# Patient Record
Sex: Male | Born: 1965 | Race: White | Hispanic: No | Marital: Married | Smoking: Current every day smoker
Health system: Southern US, Community
[De-identification: ages and names within clinical notes are randomized; demographics above are authoritative.]

## PROBLEM LIST (undated history)

## (undated) DIAGNOSIS — K219 Gastro-esophageal reflux disease without esophagitis: Secondary | ICD-10-CM

## (undated) DIAGNOSIS — F419 Anxiety disorder, unspecified: Secondary | ICD-10-CM

## (undated) DIAGNOSIS — J189 Pneumonia, unspecified organism: Secondary | ICD-10-CM

## (undated) DIAGNOSIS — Z87898 Personal history of other specified conditions: Secondary | ICD-10-CM

## (undated) DIAGNOSIS — I499 Cardiac arrhythmia, unspecified: Secondary | ICD-10-CM

## (undated) DIAGNOSIS — R0602 Shortness of breath: Secondary | ICD-10-CM

## (undated) DIAGNOSIS — E119 Type 2 diabetes mellitus without complications: Secondary | ICD-10-CM

## (undated) DIAGNOSIS — D751 Secondary polycythemia: Secondary | ICD-10-CM

## (undated) DIAGNOSIS — E785 Hyperlipidemia, unspecified: Secondary | ICD-10-CM

## (undated) DIAGNOSIS — I471 Supraventricular tachycardia, unspecified: Secondary | ICD-10-CM

## (undated) DIAGNOSIS — H53459 Other localized visual field defect, unspecified eye: Secondary | ICD-10-CM

## (undated) DIAGNOSIS — R569 Unspecified convulsions: Secondary | ICD-10-CM

## (undated) DIAGNOSIS — M1990 Unspecified osteoarthritis, unspecified site: Secondary | ICD-10-CM

## (undated) DIAGNOSIS — R7303 Prediabetes: Secondary | ICD-10-CM

## (undated) DIAGNOSIS — F1910 Other psychoactive substance abuse, uncomplicated: Secondary | ICD-10-CM

## (undated) DIAGNOSIS — R51 Headache: Secondary | ICD-10-CM

## (undated) DIAGNOSIS — R269 Unspecified abnormalities of gait and mobility: Secondary | ICD-10-CM

## (undated) DIAGNOSIS — I10 Essential (primary) hypertension: Secondary | ICD-10-CM

## (undated) DIAGNOSIS — Z972 Presence of dental prosthetic device (complete) (partial): Secondary | ICD-10-CM

## (undated) DIAGNOSIS — Z9981 Dependence on supplemental oxygen: Secondary | ICD-10-CM

## (undated) DIAGNOSIS — J449 Chronic obstructive pulmonary disease, unspecified: Secondary | ICD-10-CM

## (undated) DIAGNOSIS — Z9889 Other specified postprocedural states: Secondary | ICD-10-CM

## (undated) DIAGNOSIS — Z973 Presence of spectacles and contact lenses: Secondary | ICD-10-CM

## (undated) DIAGNOSIS — R413 Other amnesia: Secondary | ICD-10-CM

## (undated) DIAGNOSIS — I82409 Acute embolism and thrombosis of unspecified deep veins of unspecified lower extremity: Secondary | ICD-10-CM

## (undated) HISTORY — DX: Secondary polycythemia: D75.1

## (undated) HISTORY — DX: Other specified postprocedural states: Z98.890

## (undated) HISTORY — DX: Prediabetes: R73.03

## (undated) HISTORY — DX: Dependence on supplemental oxygen: Z99.81

## (undated) HISTORY — DX: Unspecified convulsions: R56.9

## (undated) HISTORY — DX: Other psychoactive substance abuse, uncomplicated: F19.10

## (undated) HISTORY — PX: INNER EAR SURGERY: SHX679

## (undated) HISTORY — DX: Unspecified abnormalities of gait and mobility: R26.9

## (undated) HISTORY — DX: Other amnesia: R41.3

## (undated) HISTORY — DX: Hyperlipidemia, unspecified: E78.5

---
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
## 1991-09-24 HISTORY — PX: TYMPANOSTOMY TUBE PLACEMENT: SHX32

## 2001-09-23 HISTORY — PX: FRACTURE SURGERY: SHX138

## 2001-09-23 HISTORY — PX: ANKLE SURGERY: SHX546

## 2002-04-25 ENCOUNTER — Emergency Department (HOSPITAL_COMMUNITY): Admission: EM | Admit: 2002-04-25 | Discharge: 2002-04-25 | Payer: Self-pay | Admitting: *Deleted

## 2003-03-16 ENCOUNTER — Inpatient Hospital Stay (HOSPITAL_COMMUNITY): Admission: EM | Admit: 2003-03-16 | Discharge: 2003-03-21 | Payer: Self-pay | Admitting: Psychiatry

## 2003-03-22 ENCOUNTER — Other Ambulatory Visit (HOSPITAL_COMMUNITY): Admission: RE | Admit: 2003-03-22 | Discharge: 2003-04-14 | Payer: Self-pay | Admitting: Psychiatry

## 2004-08-27 ENCOUNTER — Ambulatory Visit: Payer: Self-pay | Admitting: Family Medicine

## 2006-09-09 ENCOUNTER — Ambulatory Visit (HOSPITAL_COMMUNITY): Admission: RE | Admit: 2006-09-09 | Discharge: 2006-09-09 | Payer: Self-pay | Admitting: Orthopaedic Surgery

## 2006-09-23 HISTORY — PX: SHOULDER ARTHROSCOPY: SHX128

## 2007-03-10 ENCOUNTER — Ambulatory Visit (HOSPITAL_COMMUNITY): Admission: RE | Admit: 2007-03-10 | Discharge: 2007-03-10 | Payer: Self-pay | Admitting: Internal Medicine

## 2007-09-24 DIAGNOSIS — R51 Headache: Secondary | ICD-10-CM

## 2007-09-24 HISTORY — DX: Headache: R51

## 2007-09-24 HISTORY — PX: BRAIN SURGERY: SHX531

## 2008-02-02 ENCOUNTER — Emergency Department (HOSPITAL_COMMUNITY): Admission: EM | Admit: 2008-02-02 | Discharge: 2008-02-02 | Payer: Self-pay | Admitting: Emergency Medicine

## 2010-10-14 ENCOUNTER — Encounter: Payer: Self-pay | Admitting: Internal Medicine

## 2011-02-08 NOTE — Op Note (Signed)
Harold Bailey, Harold Bailey NO.:  [id number]   MEDICAL RECORD NO.:  [id number]          PATIENT TYPE:  OIB   LOCATION:  2550                         FACILITY:  [facility]   PHYSICIAN:  Vanita Panda. Magnus Ivan, M.D.DATE OF BIRTH:  01-07-1966   DATE OF PROCEDURE:  09/09/2006  DATE OF DISCHARGE:                               OPERATIVE REPORT   PREOPERATIVE DIAGNOSIS:  Left shoulder anterior labral tear with Bankart  lesion.   POSTOPERATIVE DIAGNOSIS:  Left shoulder anterior labral tear with  Bankart lesion.   PROCEDURE:  1. Left shoulder arthroscopy with extensive debridement.  2. Left shoulder arthroscopic anterior labral repair.   SURGEON:  Doneen Poisson, M.D.   ANESTHESIA:  1. Left regional shoulder block (interscalene block).  2. General anesthesia.   ANTIBIOTICS:  1 gram IV Ancef.   BLOOD LOSS:  Minimal.   COMPLICATIONS:  None.   INDICATIONS:  Briefly, Harold Bailey is a 45 year old with a history of  previous shoulder dislocations on several occasions.  He had a recent  fall and had continued anterior shoulder pain with symptoms of the  instability.  An MRI was obtained that showed an anterior labral tear  with a Bankart lesion and loose cartilage.  The rotator cuff was intact  per the MRI.  It was recommended that he undergo left shoulder  arthroscopy with debridement and labral repair.  The risks and benefits  of this were explained to him and well understood.  He agreed to proceed  with surgery.   PROCEDURE DESCRIPTION:  After interscalene block was obtained on the  right shoulder and it was marked, he was brought to the operating room  and placed supine on the operating table.  General anesthesia was then  obtained.  He was then turned into a lateral decubitus position with the  left side up.  He was on a beanbag as well.  Appropriate padding was  placed all over his body including his down leg and his genitals and  down arm, and axillary roll  was placed as well.  The beanbag was  inflated to allow for positioning.  I then had his arm prepped and  draped with DuraPrep and sterile drapes.  A shoulder traction device was  used which was an Arthrex shoulder traction device with 10 pounds of  traction longitudinally and upward traction as well to help pull the  humeral head away from the glenoid.  A time-out was then called and  identified as the correct patient and correct extremity.  I made a  posterior portal off the posterior lateral edge of the acromion and  entered the glenohumeral joint with the arthroscope.  Immediately, you  could telangiectasias that there was an anterior labral tear that  appeared chronic in nature.  The biceps tendon was intact.  The superior  labrum and posterior labrum appeared to be intact as well.  There was a  large Bankart lesion that had smooth margins, and it was unstable.  I  then made an anterior portal in the interval between this subscapularis  tendon and the biceps tendon and through this portal carried out  an  extensive debridement in the glenohumeral joint including shaving the  anterior margin of the glenoid to generate bleeding.  I used biters to  remove the Bankart piece that was caught up in the labrum, but I felt  this piece could not remain in the shoulder.  Noted, there were numerous  loose bodies present as well, and these were all removed.  I then made a  separate anterior portal as well for beginning the labral repair  process.  Again, I roughened up the surface along the anterior ledge of  the glenoid and got good bleeding tissue.  I next placed a suture passer  through labral tissue and then using a drill placed a drill hole into  the glenoid and then was able to place a push lock in approximately the  3 o'clock position of the shoulder.  I next used a suture passer and  placed a separate suture anchor slightly inferior to this.  I then  carried out a further debridement in the  shoulder and entered the  subacromial space, but it was difficult to visualize the subacromial  space in this position. I just made a separate lateral incision and  using a soft tissue ablation wand performed a bursectomy in this area,  but this was again greatly limited.  I did feel the rotator cuff was  intact.  Of note, in the glenohumeral joint, I did see a large Hill-  Sachs lesion as well that appeared old.  All instrumentation was then  removed, and I closed all portal sites with interrupted 4-0 nylon  suture.  Xeroform was placed in these wounds followed by a well-padded  sterile dressing and ABD and surgical tape.  The arm was taken out of  the traction device.  Ice was placed as well as a sling.  He was  positioned in supine position and was awakened, extubated and taken to  recovery room in stable condition.  There were no complications, and all  final counts were correct.  Postoperatively, he will remain in a sling  and will be kept from much activities of the shoulder until I feel that  he is making progress with healing.           ______________________________  Vanita Panda. Magnus Ivan, M.D.     CYB/MEDQ  D:  09/09/2006  T:  09/10/2006  Job:  [id number]

## 2011-02-08 NOTE — H&P (Signed)
NAME:  Harold Bailey, Harold Bailey                      ACCOUNT NO.:  [id number]   MEDICAL RECORD NO.:  [id number]                   PATIENT TYPE:  IPS   LOCATION:  0502                                 [facility]ILITY[facility]   PHYSICIAN:  Geoffery Lyons, M.D.                   DATE OF BIRTH:  05-Aug-1966   DATE OF ADMISSION:  03/16/2003  DATE OF DISCHARGE:  03/21/2003                         PSYCHIATRIC ADMISSION ASSESSMENT   DATE OF ASSESSMENT:  March 17, 2003   CHIEF COMPLAINT:  I might as well be dead if I'm going to use this stuff.  He is referring to his drug use.   PATIENT IDENTIFICATION:  This is a 45 year old white male who is divorced.  This is a voluntary admission.   HISTORY OF PRESENT ILLNESS:  This patient relapsed on alcohol and cocaine,  has been drinking six to 12 beers daily and has been drinking since age 30.  He also reports that he has been using crack since 1994, smoking about $100  per day whenever he can get this, which has been on an on and off basis for  the past three years.  His longest period clean and sober has been about six  years running and this has been several years ago.  Most recently, he used  about $100 of cocaine on the day prior to admission and drank a 12 pack of  beer.  He reports that he has had suicidal thoughts for the past two days  without any plan, feels worthless, hopeless, and helpless to get over his  substance abuse.  He reports his relapse triggers that kept him from staying  sober was that he just simply had too much time on his hands.   PAST PSYCHIATRIC HISTORY:  The patient has had no prior treatment.   SUBSTANCE ABUSE HISTORY:  As noted above.   PAST MEDICAL HISTORY:  The patient is followed by Tera Mater. Clent Ridges, M.D. [facility],  at [facility].  This is his primary care physician.  Medical  problems are none at this time.  He reports no acute medical problems.  Past  medical history is remarkable for some prior surgery on his left ankle.  He  does have a history of prior treatment on his ears with reporting that the  canals itch and he has some bleeding from his ears at times.  He smokes two  packs per day of cigarettes.  He denies any history of seizures, blackouts,  or memory loss.   MEDICATIONS:  None.   DRUG ALLERGIES:  None.   REVIEW OF SYSTEMS:  Review of systems is remarkable for him having some left  ear pain this morning and some decreased hearing.  This has been bothering  him in the past and a little bit for the past 48 hours but seemed quite a  bit worse this morning.   PHYSICAL EXAMINATION:  GENERAL:  The patient's physical examination  was done  in the emergency room.  To update his physical today, we note that he is a  well nourished, well developed, generally healthy appearing, muscular male  who is in no acute distress.  He is complaining of some left ear pain but is  generally fully alert, pleasant, cooperative.  VITAL SIGNS:  He is afebrile, pulse 102, respirations 22, blood pressure  126/80.  He is 6 feet tall and weighs 202 pounds at admission.  HEENT:  Head is normocephalic, atraumatic.  EENT: PERRLA.  Sclerae are  nonicteric.  No rhinorrhea.  TM reveal somewhat reddened and slightly scaly  EAC on the right but landmarks are clearly visible.  His TM is intact.  His  left tympanic membranes is reddened; it is difficult to visualize due to  copious amounts of purulent matter in the left external ear canal, not able  to see that the TM is ruptured in any way.  On the left ear, he has a  positive pinna tug.  Oropharynx is noninjected.  NECK:  Supple, no thyromegaly.  CARDIOVASCULAR:  Apical pulse is regular and synchronous with radial pulse.  LUNGS:  Clear to auscultation.  ABDOMEN:  Muscular, flat, soft, nontender.  GENITALIA:  Deferred.  MUSCULOSKELETAL:  Strength is 5/5.  No joint deformities noted.  NEUROLOGIC:  Cranial nerves II-XII are intact.  EOM are intact, no  nystagmus.  Motor movements are  smooth.  Grip strength: Equal bilaterally.  Facial symmetry is present.  Romberg: Without findings.  Deep tendon  reflexes 3+/5.  Toes are downgoing.  No focal findings.   LABORATORY DATA:  Diagnostic studies revealed his CBC was within normal  limits as was his routine chemistry and liver enzymes were normal.  Thyroid  was currently pending.  Urinalysis and urine drug screen were currently  pending.   SOCIAL HISTORY:  The patient is divorced for the past 10 years, works full-  time for [employer] in the [employer].  He lives alone, no  children.  He does have legal charges for passing bad checks.   FAMILY HISTORY:  Family history is remarkable a father with a history of  alcohol abuse.   MENTAL STATUS EXAM:  This is a fully alert male who is talkative with an  appropriate affect, cooperative, pleasant, polite.  Speech: Normal.  Mood is  euthymic.  Thought process is logical and coherent.  He is positive for some  suicidal thought, no clear plan but feels hopeless, worthless, unable to see  his way clear to stay sober and clean but he is aware of his triggers.  No  homicidal ideation, no auditory and visual hallucinations.  Cognitive:  Intact and oriented x 3.   ADMISSION DIAGNOSES:   AXIS I:  1. Alcohol abuse and dependence.  2. Cocaine abuse, rule out dependence.   AXIS II:  No diagnosis.   AXIS III:  Otitis media, rule out ruptured tympanic membrane.   AXIS IV:  Problems with increased idle time and loneliness.   AXIS V:  Current 39, past year 52.   INITIAL PLAN OF CARE:  Plan is to voluntarily admit the patient to stabilize  and alleviate his suicidal thought.  We are going to place him on trazodone  50 mg h.s. p.r.n. and this may be repeated once.  We have placed him on  Lexapro 10 mg p.o. q.a.m. to address his depression symptoms.  The Librium  protocol will be used to detoxify him from alcohol, which so  far he is tolerating well.  Meanwhile, we will  also place him on Vistaril 25 mg p.o.  h.s. p.r.n. for anxiety and that may be repeated x 1 and Symmetrel 100 mg  b.i.d. for cravings.  We are going to refer him to  [facility] after discharge is what our initial thinking is and we are going to  give him some Percocet for his ear pain and ask internal medicine to repeat  our ear exam and treat this as they see fit.   ESTIMATED LENGTH OF STAY:  Five days.     Margaret A. Scott, N.P.                   Geoffery Lyons, M.D.    MAS/MEDQ  D:  06/14/2003  T:  06/15/2003  Job:  [id number]

## 2011-02-08 NOTE — Discharge Summary (Signed)
NAME:  Harold Bailey, Harold Bailey                      ACCOUNT NO.:  [id number]   MEDICAL RECORD NO.:  [id number]                   PATIENT TYPE:  IPS   LOCATION:  0502                                 [facility]ACILITY[facility]   PHYSICIAN:  Geoffery Lyons, M.D.                   DATE OF BIRTH:  04/13/1966   DATE OF ADMISSION:  03/16/2003  DATE OF DISCHARGE:  03/21/2003                                 DISCHARGE SUMMARY   CHIEF COMPLAINT AND PRESENT ILLNESS:  This was the first admission to [facility] for this 45 year old white male, divorced,  voluntarily admitted.  Relapsed on alcohol and cocaine, drinking 6-12 beers  daily.  Has been drinking since age 37, using crack since 1994, smoking $100  per day when he could spend the money in the last three years, on and off.  Suicidal thoughts for two days without a plan.  Feeling worthless.  Feeling  that he would rather be dead than continuing using that stuff.   PAST PSYCHIATRIC HISTORY:  No previous treatment.   ALCOHOL/DRUG HISTORY:  As already stated, persistent use of alcohol and  cocaine.   PAST MEDICAL HISTORY:  No history of any major medical conditions.   MEDICATIONS:  None.   PHYSICAL EXAMINATION:  Performed and failed to show any acute findings.   MENTAL STATUS EXAM:  Fully alert, talkative male with appropriate affect.  Speech within normal limits.  Mood euthymic.  Mood anxious.  Affect broad.  Thought processes logical and coherent.  Endorsed underlying suicidal  ideation.  No plan.  No homicidal ideation.  No delusions.  No  hallucinations.  Cognition well-preserved.   ADMISSION DIAGNOSES:   AXIS I:  1. Alcohol dependence.  2. Cocaine dependence.  3. Rule out depressive disorder not otherwise specified.   AXIS II:  No diagnosis.   AXIS III:  Otitis media.   AXIS IV:  Moderate.   AXIS V:  Global Assessment of Functioning upon admission 29; highest Global  Assessment of Functioning in the last year 62.   LABORATORY DATA:  CBC with hemoglobin 17.8.  Blood chemistry within normal  limits.  Thyroid profile within normal limits.   HOSPITAL COURSE:  He was admitted and started intensive individual and group  psychotherapy.  He was detoxified using Librium.  He was initially given  some Lexapro and some trazodone but he preferred not to take any  medications.  By the second day, he was doing much better with hardly any  Librium.  He was treated for otitis media.  He claimed he started drinking,  got intoxicated and then started using cocaine.  Mood continued to be of  anxiety.  Affect of anxiety.  He was dealing with the events that led to the  hospitalization.  Working on himself and coping skills, relapse prevention.  Wanting to pursue 12-step.  Speech basically pressured, loud.  Possibility  of ADHD  was raised.  Continued to work on himself, on a relapse prevention  plan.  On April 20, 2003, he was in full contact with reality.  Mood  euthymic.  Affect broad, bright.  No delusions, hallucinations.  Increased  insight.  Committed to abstinence.  We went ahead and discharged to  outpatient follow-up.   DISCHARGE DIAGNOSES:   AXIS I:  1. Alcohol and cocaine dependence.  2. Rule out attention-deficit hyperactivity disorder.   AXIS II:  No diagnosis.   AXIS III:  Otitis media.   AXIS IV:  Moderate.   AXIS V:  Global Assessment of Functioning upon discharge 50.   DISCHARGE MEDICATIONS:  __________ %, otic drops, 10 days.   [facility]                                               Geoffery Lyons, M.D.    IL/MEDQ  D:  05/25/2003  T:  05/26/2003  Job:  [id number]

## 2011-10-02 ENCOUNTER — Other Ambulatory Visit: Payer: Self-pay | Admitting: Orthopaedic Surgery

## 2011-10-02 DIAGNOSIS — M25512 Pain in left shoulder: Secondary | ICD-10-CM

## 2011-10-14 ENCOUNTER — Inpatient Hospital Stay: Admission: RE | Admit: 2011-10-14 | Payer: Self-pay | Source: Ambulatory Visit

## 2011-10-18 ENCOUNTER — Inpatient Hospital Stay: Admission: RE | Admit: 2011-10-18 | Payer: Self-pay | Source: Ambulatory Visit

## 2011-10-21 ENCOUNTER — Other Ambulatory Visit: Payer: Self-pay

## 2011-10-22 ENCOUNTER — Ambulatory Visit (HOSPITAL_COMMUNITY)
Admission: RE | Admit: 2011-10-22 | Discharge: 2011-10-22 | Disposition: A | Discharge status: Home / Self Care | Payer: Self-pay | Attending: Psychiatry | Admitting: Psychiatry

## 2011-10-22 DIAGNOSIS — F1994 Other psychoactive substance use, unspecified with psychoactive substance-induced mood disorder: Secondary | ICD-10-CM | POA: Insufficient documentation

## 2011-10-22 DIAGNOSIS — F1020 Alcohol dependence, uncomplicated: Secondary | ICD-10-CM | POA: Insufficient documentation

## 2011-10-22 NOTE — BH Assessment (Signed)
Assessment Note   Harold Bailey is an 46 y.o. male. PT WAS REFERRED BY PAIN MANAGEMENT CLINIC DUE TO PT ABUSING ETOH WITH PAIN MEDS. CLINICIAN SPOKE WITH AMY STARNS WHO EXPRESSED THAT PT NEEDED HELP & HAD SPOKEN WITH ANN EVAN WHO AGREED TO TAKE THE PT. PT DENIES SI, HI  OR AV. PER ANY STARNS, PT DRINKS 12PK DAILY WHICH PT DENIES AMT USE & ANY CURRENT COCAINE USE. PT HAS EXPRESSED THAT HE WAS NOT READY TO STARTED [facility] BUT WILL TRY TO START NEXT. PT DENIES ANY STRESSORS & LIVES WITH SPOUSE. PT IS ABLE TO TO CONTRACT FOR SAFETY.  Axis I: Substance Induced Mood Disorder and ALCOHOL DEPENDENCE Axis II: Deferred Axis III: No past medical history on file. Axis IV: other psychosocial or environmental problems and problems with primary support group Axis V: 51-60 moderate symptoms  Past Medical History: No past medical history on file.  No past surgical history on file.  Family History: No family history on file.  Social History:  does not have a smoking history on file. He does not have any smokeless tobacco history on file. His alcohol and drug histories not on file.  Additional Social History:    Allergies: Allergies not on file  Home Medications:  No current outpatient prescriptions on file as of 10/22/2011.   No current facility-administered medications on file as of 10/22/2011.    OB/GYN Status:  No LMP for male patient.  General Assessment Data Location of Assessment: [facility] Services Living Arrangements: Spouse/significant other Can pt return to current living arrangement?: Yes Admission Status: Voluntary Is patient capable of signing voluntary admission?: Yes Transfer from: Home Referral Source: Other     Risk to self Suicidal Ideation: No Suicidal Intent: No Is patient at risk for suicide?: No Suicidal Plan?: No Access to Means: No What has been your use of drugs/alcohol within the last 12 months?: PT HAS BEEN DRINK SINCE AGE 79 & WAS DRINKING 12 PK DAILY &  LAST DRINK WAS 2 WEEKS AGO;; PT ADMIT TO CRACK COCAINE AT AGE 79 & HAS BEEN SOBER FROM IX 11YRS Previous Attempts/Gestures: No How many times?: 0  Other Self Harm Risks: NA Triggers for Past Attempts: Unpredictable Intentional Self Injurious Behavior: None Family Suicide History: Unknown Recent stressful life event(s): Other (Comment) Persecutory voices/beliefs?: No Depression: No Depression Symptoms: Loss of interest in usual pleasures Substance abuse history and/or treatment for substance abuse?: Yes Suicide prevention information given to non-admitted patients: Not applicable  Risk to Others Homicidal Ideation: No Thoughts of Harm to Others: No Current Homicidal Intent: No Current Homicidal Plan: No Access to Homicidal Means: No Identified Victim: NA History of harm to others?: No Assessment of Violence: None Noted Violent Behavior Description: CALM, COOPERATIVE Does patient have access to weapons?: No Criminal Charges Pending?: No Does patient have a court date: No  Psychosis Hallucinations: None noted Delusions: None noted  Mental Status Report Appear/Hygiene: Improved Eye Contact: Poor Motor Activity: Freedom of movement Speech: Logical/coherent Level of Consciousness: Alert Mood: Depressed Affect: Appropriate to circumstance;Anxious;Apprehensive Anxiety Level: Moderate Judgement: Impaired Orientation: Person;Place;Time;Situation Obsessive Compulsive Thoughts/Behaviors: None  Cognitive Functioning Concentration: Decreased Memory: Recent Intact;Remote Intact IQ: Average Insight: Poor Impulse Control: Poor Appetite: Poor Weight Loss: 0  Weight Gain: 0  Sleep: Decreased Total Hours of Sleep: 4  Vegetative Symptoms: None  Prior Inpatient Therapy Prior Inpatient Therapy: No Prior Therapy Dates: NA Prior Therapy Facilty/Provider(s): NA Reason for Treatment: NA  Prior Outpatient Therapy Prior Outpatient Therapy: No  Prior Therapy Dates: NA Prior Therapy  Facilty/Provider(s): NA Reason for Treatment: NA                     Additional Information 1:1 In Past 12 Months?: No CIRT Risk: No Elopement Risk: No Does patient have medical clearance?: No     Disposition:  Disposition Disposition of Patient: Outpatient treatment Type of outpatient treatment: Chemical Dependence - Intensive Outpatient  On Site Evaluation by:   Reviewed with Physician:     Waldron Session 10/22/2011 1:00 PM

## 2011-11-07 ENCOUNTER — Encounter (HOSPITAL_COMMUNITY): Payer: Self-pay | Admitting: Pharmacy Technician

## 2011-11-11 ENCOUNTER — Other Ambulatory Visit (HOSPITAL_COMMUNITY): Payer: Self-pay | Admitting: Orthopedic Surgery

## 2011-11-22 ENCOUNTER — Encounter (HOSPITAL_COMMUNITY): Admission: RE | Payer: Self-pay | Source: Ambulatory Visit

## 2011-11-22 ENCOUNTER — Ambulatory Visit (HOSPITAL_COMMUNITY): Admission: RE | Admit: 2011-11-22 | Payer: Medicare Other | Source: Ambulatory Visit | Admitting: Orthopedic Surgery

## 2011-11-22 SURGERY — ANKLE FUSION
Anesthesia: General | Site: Ankle | Laterality: Left

## 2011-11-29 ENCOUNTER — Other Ambulatory Visit (HOSPITAL_COMMUNITY): Payer: Self-pay | Admitting: Physician Assistant

## 2011-11-29 ENCOUNTER — Ambulatory Visit (HOSPITAL_COMMUNITY)
Admission: RE | Admit: 2011-11-29 | Discharge: 2011-11-29 | Disposition: A | Discharge status: Home / Self Care | Payer: Medicare Other | Source: Ambulatory Visit | Attending: Physician Assistant | Admitting: Physician Assistant

## 2011-11-29 DIAGNOSIS — F17200 Nicotine dependence, unspecified, uncomplicated: Secondary | ICD-10-CM

## 2011-11-29 DIAGNOSIS — R0602 Shortness of breath: Secondary | ICD-10-CM

## 2012-01-10 ENCOUNTER — Encounter (HOSPITAL_COMMUNITY): Payer: Self-pay | Admitting: Pharmacy Technician

## 2012-01-10 ENCOUNTER — Other Ambulatory Visit (HOSPITAL_COMMUNITY): Payer: Self-pay | Admitting: Orthopedic Surgery

## 2012-01-14 ENCOUNTER — Encounter (HOSPITAL_COMMUNITY): Payer: Self-pay

## 2012-01-14 ENCOUNTER — Encounter (HOSPITAL_COMMUNITY)
Admission: RE | Admit: 2012-01-14 | Discharge: 2012-01-14 | Disposition: A | Discharge status: Home / Self Care | Payer: Medicare Other | Source: Ambulatory Visit | Attending: Orthopedic Surgery | Admitting: Orthopedic Surgery

## 2012-01-14 HISTORY — DX: Headache: R51

## 2012-01-14 HISTORY — DX: Essential (primary) hypertension: I10

## 2012-01-14 HISTORY — DX: Gastro-esophageal reflux disease without esophagitis: K21.9

## 2012-01-14 HISTORY — DX: Shortness of breath: R06.02

## 2012-01-14 HISTORY — DX: Unspecified osteoarthritis, unspecified site: M19.90

## 2012-01-14 LAB — COMPREHENSIVE METABOLIC PANEL
ALT: 59 U/L — ABNORMAL HIGH (ref 0–53)
Alkaline Phosphatase: 83 U/L (ref 39–117)
CO2: 26 mEq/L (ref 19–32)
Calcium: 10.3 mg/dL (ref 8.4–10.5)
GFR calc Af Amer: >90 mL/min (ref >90)
GFR calc non Af Amer: >90 mL/min (ref >90)
Glucose, Bld: 131 mg/dL — ABNORMAL HIGH (ref 70–99)
Potassium: 5 mEq/L (ref 3.5–5.1)
Sodium: 137 mEq/L (ref 135–145)
Total Bilirubin: 0.6 mg/dL (ref 0.3–1.2)

## 2012-01-14 LAB — CBC
Hemoglobin: 19.5 g/dL — ABNORMAL HIGH (ref 13.0–17.0)
MCH: 34.6 pg — ABNORMAL HIGH (ref 26.0–34.0)
RBC: 5.63 MIL/uL (ref 4.22–5.81)

## 2012-01-14 NOTE — Progress Notes (Signed)
Pt positive for Staph organisms.  Pt notified and instructed to get rx filled.  Pt verbalized understanding.//L. Arlana Canizales,RN

## 2012-01-14 NOTE — Progress Notes (Signed)
Pt instructed on Incentive Spirometer use.  Pt demonstrated back to RN on how to use and verbalized understanding.//L. Zay Yeargan,RN

## 2012-01-14 NOTE — Progress Notes (Signed)
Noted that pt's HCT-55.0, HGB-19.5.  Chart given to A. Zelenak, PA-C, to review for surgery.//L. Iya Hamed,RN

## 2012-01-14 NOTE — Pre-Procedure Instructions (Addendum)
20 [name]  01/14/2012   Your procedure is scheduled on:  Friday, April 26th@1230PM .  Report to [facility] at 10:30 AM.  Call this number if you have problems the morning of surgery: [phone]   Remember:   Do not eat food:After Midnight.  May have clear liquids: up to 4 Hours before arrival( nothing after 6:30AM).  Clear liquids include soda, tea, black coffee, apple or grape juice, broth.  Take these medicines the morning of surgery with A SIP OF WATER: Xanax, Lortab or Percocet, Bisprolol-Hydrochlorothiazide.   Do not wear jewelry, make-up or nail polish.  Do not wear lotions, powders, or perfumes. You may wear deodorant.  Do not shave 48 hours prior to surgery.  Do not bring valuables to the hospital.  Contacts, dentures or bridgework may not be worn into surgery.  Leave suitcase in the car. After surgery it may be brought to your room.  For patients admitted to the hospital, checkout time is 11:00 AM the day of discharge.   Patients discharged the day of surgery will not be allowed to drive home.  Name and phone number of your driver: Will go to rehab when discharged.  Special Instructions: CHG Shower Use Special Wash: 1/2 bottle night before surgery and 1/2 bottle morning of surgery.   Please read over the following fact sheets that you were given: Pain Booklet, Coughing and Deep Breathing and Surgical Site Infection Prevention

## 2012-01-14 NOTE — Progress Notes (Addendum)
Pt's PCP was Dr. Linton Rump Stevenson(#[phone]) but now is being followed by Dr. Juliane Poot.  Due to pt having a hx of brain tumor. Requested last OV note from Dr. Jossie Ng office and his Neurosurgeon at [facility], Dr. Jeannett Senior [phone]). Pt stated that he has been released from Dr. Angelyn Punt and was cleared for surgery.//L. Marjean Imperato,RN

## 2012-01-15 ENCOUNTER — Encounter (HOSPITAL_COMMUNITY): Payer: Self-pay | Admitting: Vascular Surgery

## 2012-01-15 NOTE — Consult Note (Signed)
Anesthesia Chart Review:  Patient is a 46 year old male scheduled for left ankle arthroscopic fusion on 01/17/12.  History includes smoking, GERD, HTN, SOB, arthritis, polycythemia, insomnia, headaches with history of meningoencephalitis.  He was followed by Neurosurgeon Dr. Brooke Pace at [facility], but discharged in August 2012, as he was felt stable from their standpoint.  According to his last NS note, patient is disabled due to visual field deficit, behavioral health, and chronic pain.  His PCP is Dr. Lucky Cowboy.  EKG from 01/14/12 showed NSR.  CXR from 11/29/11 showed no acute process.  Labs noted.  H/H 19.5/55.0.  WBC 14.5  ALT elevated at 59. PLT and coags WNL. According to labs received from his PCP, his H/H on 11/29/11 were 18.8/55.1 (with previous Hgb of 19.5.)  From the notes currently available to me, it appears his polycythemia is followed by his PCP (versus a Hematologist). His H/H appears to be within his baseline and would expect at least some blood loss with surgery, so anticipate he can proceed.  Anesthesiologist Dr. Ivin Booty agreed with plan.

## 2012-01-16 MED ORDER — CEFAZOLIN SODIUM-DEXTROSE 2-3 GM-% IV SOLR
2.0000 g | INTRAVENOUS | Status: AC
  Administered 2012-01-17: 2 g via INTRAVENOUS
  Filled 2012-01-16: qty 50

## 2012-01-17 ENCOUNTER — Encounter (HOSPITAL_COMMUNITY)
Admission: RE | Disposition: A | Discharge status: Skilled Nursing Facility | Payer: Self-pay | Source: Ambulatory Visit | Attending: Orthopedic Surgery

## 2012-01-17 ENCOUNTER — Encounter (HOSPITAL_COMMUNITY): Payer: Self-pay | Admitting: *Deleted

## 2012-01-17 ENCOUNTER — Ambulatory Visit (HOSPITAL_COMMUNITY): Payer: Medicare Other | Admitting: Vascular Surgery

## 2012-01-17 ENCOUNTER — Inpatient Hospital Stay (HOSPITAL_COMMUNITY)
Admission: RE | Admit: 2012-01-17 | Discharge: 2012-01-20 | DRG: 494 | Disposition: A | Discharge status: Skilled Nursing Facility | Payer: Medicare Other | Source: Ambulatory Visit | Attending: Orthopedic Surgery | Admitting: Orthopedic Surgery

## 2012-01-17 ENCOUNTER — Encounter (HOSPITAL_COMMUNITY): Payer: Self-pay | Admitting: Vascular Surgery

## 2012-01-17 DIAGNOSIS — M19079 Primary osteoarthritis, unspecified ankle and foot: Principal | ICD-10-CM | POA: Diagnosis present

## 2012-01-17 DIAGNOSIS — Z01810 Encounter for preprocedural cardiovascular examination: Secondary | ICD-10-CM

## 2012-01-17 DIAGNOSIS — I10 Essential (primary) hypertension: Secondary | ICD-10-CM | POA: Diagnosis present

## 2012-01-17 DIAGNOSIS — Z79899 Other long term (current) drug therapy: Secondary | ICD-10-CM

## 2012-01-17 DIAGNOSIS — K219 Gastro-esophageal reflux disease without esophagitis: Secondary | ICD-10-CM | POA: Diagnosis present

## 2012-01-17 DIAGNOSIS — Z01812 Encounter for preprocedural laboratory examination: Secondary | ICD-10-CM

## 2012-01-17 DIAGNOSIS — F17200 Nicotine dependence, unspecified, uncomplicated: Secondary | ICD-10-CM | POA: Diagnosis present

## 2012-01-17 HISTORY — PX: ANKLE ARTHROSCOPY: SHX545

## 2012-01-17 SURGERY — ARTHROSCOPY, ANKLE
Anesthesia: General | Site: Leg Lower | Laterality: Left | Wound class: Clean

## 2012-01-17 MED ORDER — LORAZEPAM 1 MG PO TABS
1.0000 mg | ORAL_TABLET | Freq: Four times a day (QID) | ORAL | Status: DC | PRN
  Administered 2012-01-18 – 2012-01-19 (×3): 1 mg via ORAL
  Filled 2012-01-17 (×4): qty 1

## 2012-01-17 MED ORDER — SODIUM CHLORIDE 0.9 % IV SOLN
200.0000 ug | INTRAVENOUS | Status: DC | PRN
  Administered 2012-01-17: 50.4 ug via INTRAVENOUS

## 2012-01-17 MED ORDER — LORAZEPAM 2 MG/ML IJ SOLN: 1.0000 mg | Freq: Four times a day (QID) | INTRAMUSCULAR | Status: DC | PRN

## 2012-01-17 MED ORDER — HYDROMORPHONE HCL PF 1 MG/ML IJ SOLN
0.5000 mg | INTRAMUSCULAR | Status: DC | PRN
  Administered 2012-01-17 – 2012-01-18 (×6): 1 mg via INTRAVENOUS
  Filled 2012-01-17 (×6): qty 1

## 2012-01-17 MED ORDER — LIDOCAINE HCL (CARDIAC) 20 MG/ML IV SOLN
INTRAVENOUS | Status: DC | PRN
  Administered 2012-01-17: 40 mg via INTRAVENOUS

## 2012-01-17 MED ORDER — LACTATED RINGERS IV SOLN
INTRAVENOUS | Status: DC
Start: 2012-01-17 — End: 2012-01-17
  Administered 2012-01-17: 11:00:00 via INTRAVENOUS

## 2012-01-17 MED ORDER — ROCURONIUM BROMIDE 100 MG/10ML IV SOLN
INTRAVENOUS | Status: DC | PRN
  Administered 2012-01-17: 50 mg via INTRAVENOUS

## 2012-01-17 MED ORDER — GABAPENTIN 300 MG PO CAPS: 300.0000 mg | ORAL_CAPSULE | Freq: Every day | ORAL | Status: DC

## 2012-01-17 MED ORDER — COUMADIN BOOK
Freq: Once | Status: AC
  Administered 2012-01-17: 16:00:00
  Filled 2012-01-17: qty 1

## 2012-01-17 MED ORDER — THIAMINE HCL 100 MG/ML IJ SOLN
100.0000 mg | Freq: Every day | INTRAMUSCULAR | Status: DC
  Filled 2012-01-17 (×4): qty 1

## 2012-01-17 MED ORDER — WARFARIN SODIUM 10 MG PO TABS
10.0000 mg | ORAL_TABLET | Freq: Once | ORAL | Status: AC
  Administered 2012-01-17: 10 mg via ORAL
  Filled 2012-01-17: qty 1

## 2012-01-17 MED ORDER — BISOPROLOL-HYDROCHLOROTHIAZIDE 10-6.25 MG PO TABS: 1.0000 | ORAL_TABLET | Freq: Every day | ORAL | Status: DC

## 2012-01-17 MED ORDER — CEFAZOLIN SODIUM 1-5 GM-% IV SOLN
1.0000 g | Freq: Four times a day (QID) | INTRAVENOUS | Status: AC
  Administered 2012-01-17 – 2012-01-18 (×3): 1 g via INTRAVENOUS
  Filled 2012-01-17 (×3): qty 50

## 2012-01-17 MED ORDER — ONDANSETRON HCL 4 MG/2ML IJ SOLN
INTRAMUSCULAR | Status: DC | PRN
  Administered 2012-01-17: 4 mg via INTRAVENOUS

## 2012-01-17 MED ORDER — GLYCOPYRROLATE 0.2 MG/ML IJ SOLN
INTRAMUSCULAR | Status: DC | PRN
  Administered 2012-01-17: .5 mg via INTRAVENOUS

## 2012-01-17 MED ORDER — WARFARIN VIDEO: Freq: Once | Status: DC

## 2012-01-17 MED ORDER — BISOPROLOL FUMARATE 10 MG PO TABS
10.0000 mg | ORAL_TABLET | Freq: Every day | ORAL | Status: DC
  Administered 2012-01-18 – 2012-01-20 (×3): 10 mg via ORAL
  Filled 2012-01-17 (×3): qty 1

## 2012-01-17 MED ORDER — HYDROCHLOROTHIAZIDE 10 MG/ML ORAL SUSPENSION
6.2500 mg | Freq: Every day | ORAL | Status: DC
  Administered 2012-01-18 – 2012-01-20 (×3): 6.25 mg via ORAL
  Filled 2012-01-17 (×3): qty 1.25

## 2012-01-17 MED ORDER — LACTATED RINGERS IV SOLN
INTRAVENOUS | Status: DC | PRN
  Administered 2012-01-17 (×2): via INTRAVENOUS

## 2012-01-17 MED ORDER — HYDROMORPHONE HCL PF 1 MG/ML IJ SOLN
0.2500 mg | INTRAMUSCULAR | Status: DC | PRN
  Administered 2012-01-17 (×4): 0.5 mg via INTRAVENOUS

## 2012-01-17 MED ORDER — ONDANSETRON HCL 4 MG/2ML IJ SOLN: 4.0000 mg | Freq: Once | INTRAMUSCULAR | Status: DC | PRN

## 2012-01-17 MED ORDER — SODIUM CHLORIDE 0.9 % IR SOLN
Status: DC | PRN
  Administered 2012-01-17: 1

## 2012-01-17 MED ORDER — ONDANSETRON HCL 4 MG PO TABS: 4.0000 mg | ORAL_TABLET | Freq: Four times a day (QID) | ORAL | Status: DC | PRN

## 2012-01-17 MED ORDER — FOLIC ACID 1 MG PO TABS
1.0000 mg | ORAL_TABLET | Freq: Every day | ORAL | Status: DC
  Administered 2012-01-17 – 2012-01-20 (×4): 1 mg via ORAL
  Filled 2012-01-17 (×4): qty 1

## 2012-01-17 MED ORDER — ADULT MULTIVITAMIN W/MINERALS CH
1.0000 | ORAL_TABLET | Freq: Every day | ORAL | Status: DC
  Administered 2012-01-17 – 2012-01-20 (×4): 1 via ORAL
  Filled 2012-01-17 (×4): qty 1

## 2012-01-17 MED ORDER — LIDOCAINE HCL 4 % MT SOLN
OROMUCOSAL | Status: DC | PRN
  Administered 2012-01-17: 4 mL via TOPICAL

## 2012-01-17 MED ORDER — GABAPENTIN 300 MG PO CAPS
300.0000 mg | ORAL_CAPSULE | Freq: Every day | ORAL | Status: DC
  Administered 2012-01-17 – 2012-01-19 (×3): 300 mg via ORAL
  Filled 2012-01-17 (×4): qty 1

## 2012-01-17 MED ORDER — MIDAZOLAM HCL 5 MG/5ML IJ SOLN
INTRAMUSCULAR | Status: DC | PRN
  Administered 2012-01-17: 2 mg via INTRAVENOUS

## 2012-01-17 MED ORDER — SODIUM CHLORIDE 0.9 % IV SOLN: INTRAVENOUS | Status: DC

## 2012-01-17 MED ORDER — SODIUM CHLORIDE 0.9 % IV SOLN
0.4000 ug/kg/h | INTRAVENOUS | Status: DC
  Filled 2012-01-17: qty 4

## 2012-01-17 MED ORDER — CEFAZOLIN SODIUM-DEXTROSE 2-3 GM-% IV SOLR: 2.0000 g | INTRAVENOUS | Status: DC

## 2012-01-17 MED ORDER — HYDROCODONE-ACETAMINOPHEN 7.5-325 MG PO TABS
1.0000 | ORAL_TABLET | ORAL | Status: DC | PRN
  Administered 2012-01-18 – 2012-01-19 (×3): 2 via ORAL
  Filled 2012-01-17 (×4): qty 2

## 2012-01-17 MED ORDER — METOCLOPRAMIDE HCL 5 MG PO TABS
5.0000 mg | ORAL_TABLET | Freq: Three times a day (TID) | ORAL | Status: DC | PRN
  Filled 2012-01-17: qty 2

## 2012-01-17 MED ORDER — ALPRAZOLAM 0.5 MG PO TABS
1.0000 mg | ORAL_TABLET | Freq: Two times a day (BID) | ORAL | Status: DC | PRN
  Administered 2012-01-17: 1 mg via ORAL
  Filled 2012-01-17: qty 2

## 2012-01-17 MED ORDER — VITAMIN B-1 100 MG PO TABS
100.0000 mg | ORAL_TABLET | Freq: Every day | ORAL | Status: DC
  Administered 2012-01-17 – 2012-01-20 (×4): 100 mg via ORAL
  Filled 2012-01-17 (×4): qty 1

## 2012-01-17 MED ORDER — WARFARIN - PHARMACIST DOSING INPATIENT: Freq: Every day | Status: DC

## 2012-01-17 MED ORDER — NEOSTIGMINE METHYLSULFATE 1 MG/ML IJ SOLN
INTRAMUSCULAR | Status: DC | PRN
  Administered 2012-01-17: 4 mg via INTRAVENOUS

## 2012-01-17 MED ORDER — ONDANSETRON HCL 4 MG/2ML IJ SOLN: 4.0000 mg | Freq: Four times a day (QID) | INTRAMUSCULAR | Status: DC | PRN

## 2012-01-17 MED ORDER — OXYCODONE-ACETAMINOPHEN 5-325 MG PO TABS
1.0000 | ORAL_TABLET | ORAL | Status: DC | PRN
  Administered 2012-01-17 – 2012-01-20 (×12): 2 via ORAL
  Filled 2012-01-17 (×13): qty 2

## 2012-01-17 MED ORDER — FENTANYL CITRATE 0.05 MG/ML IJ SOLN
INTRAMUSCULAR | Status: DC | PRN
  Administered 2012-01-17: 50 ug via INTRAVENOUS
  Administered 2012-01-17 (×2): 100 ug via INTRAVENOUS
  Administered 2012-01-17: 150 ug via INTRAVENOUS

## 2012-01-17 MED ORDER — MIDAZOLAM HCL 2 MG/2ML IJ SOLN
2.0000 mg | Freq: Once | INTRAMUSCULAR | Status: AC
  Administered 2012-01-17: 1 mg via INTRAVENOUS

## 2012-01-17 MED ORDER — METHOCARBAMOL 500 MG PO TABS
500.0000 mg | ORAL_TABLET | Freq: Four times a day (QID) | ORAL | Status: DC | PRN
  Administered 2012-01-17 – 2012-01-20 (×8): 500 mg via ORAL
  Filled 2012-01-17 (×8): qty 1

## 2012-01-17 MED ORDER — PROPOFOL 10 MG/ML IV EMUL
INTRAVENOUS | Status: DC | PRN
  Administered 2012-01-17: 190 mg via INTRAVENOUS

## 2012-01-17 MED ORDER — METHOCARBAMOL 100 MG/ML IJ SOLN
500.0000 mg | Freq: Four times a day (QID) | INTRAMUSCULAR | Status: DC | PRN
  Filled 2012-01-17: qty 5

## 2012-01-17 MED ORDER — METOCLOPRAMIDE HCL 5 MG/ML IJ SOLN: 5.0000 mg | Freq: Three times a day (TID) | INTRAMUSCULAR | Status: DC | PRN

## 2012-01-17 SURGICAL SUPPLY — 72 items
2.8MM GUIDEPIN ×2 IMPLANT
BANDAGE ESMARK 6X9 LF (GAUZE/BANDAGES/DRESSINGS) IMPLANT
BANDAGE GAUZE ELAST BULKY 4 IN (GAUZE/BANDAGES/DRESSINGS) ×4 IMPLANT
BIT DRILL CANN LRG QC 5X300 (BIT) ×1 IMPLANT
BLADE CUDA 5.5 (BLADE) IMPLANT
BLADE GREAT WHITE 4.2 (BLADE) ×1 IMPLANT
BLADE INCISOR PLUS 5.5 (BLADE) IMPLANT
BLADE SAW SGTL HD 18.5X60.5X1. (BLADE) ×1 IMPLANT
BLADE SURG 10 STRL SS (BLADE) ×1 IMPLANT
BNDG CMPR 9X6 STRL LF SNTH (GAUZE/BANDAGES/DRESSINGS) ×1
BNDG COHESIVE 4X5 TAN STRL (GAUZE/BANDAGES/DRESSINGS) ×2 IMPLANT
BNDG COHESIVE 6X5 TAN STRL LF (GAUZE/BANDAGES/DRESSINGS) ×2 IMPLANT
BNDG ESMARK 6X9 LF (GAUZE/BANDAGES/DRESSINGS) ×2
BUR OVAL 6.0 (BURR) ×1 IMPLANT
CLOTH BEACON ORANGE TIMEOUT ST (SAFETY) ×2 IMPLANT
COTTON STERILE ROLL (GAUZE/BANDAGES/DRESSINGS) ×2 IMPLANT
COVER MAYO STAND STRL (DRAPES) IMPLANT
COVER SURGICAL LIGHT HANDLE (MISCELLANEOUS) ×2 IMPLANT
CUFF TOURNIQUET SINGLE 34IN LL (TOURNIQUET CUFF) IMPLANT
CUFF TOURNIQUET SINGLE 44IN (TOURNIQUET CUFF) IMPLANT
DRAPE ARTHROSCOPY W/POUCH 114 (DRAPES) ×2 IMPLANT
DRAPE C-ARM MINI 42X72 WSTRAPS (DRAPES) IMPLANT
DRAPE INCISE IOBAN 66X45 STRL (DRAPES) ×1 IMPLANT
DRAPE OEC MINIVIEW 54X84 (DRAPES) ×1 IMPLANT
DRAPE U-SHAPE 47X51 STRL (DRAPES) ×2 IMPLANT
DRSG ADAPTIC 3X8 NADH LF (GAUZE/BANDAGES/DRESSINGS) ×2 IMPLANT
DRSG EMULSION OIL 3X3 NADH (GAUZE/BANDAGES/DRESSINGS) ×2 IMPLANT
DRSG PAD ABDOMINAL 8X10 ST (GAUZE/BANDAGES/DRESSINGS) ×2 IMPLANT
DURAPREP 26ML APPLICATOR (WOUND CARE) ×2 IMPLANT
ELECT REM PT RETURN 9FT ADLT (ELECTROSURGICAL) ×2
ELECTRODE REM PT RTRN 9FT ADLT (ELECTROSURGICAL) ×1 IMPLANT
FLUID NSS /IRRIG 3000 ML XXX (IV SOLUTION) ×3 IMPLANT
GLOVE BIOGEL PI IND STRL 9 (GLOVE) ×1 IMPLANT
GLOVE BIOGEL PI INDICATOR 9 (GLOVE) ×1
GLOVE SURG ORTHO 9.0 STRL STRW (GLOVE) ×2 IMPLANT
GOWN PREVENTION PLUS XLARGE (GOWN DISPOSABLE) ×2 IMPLANT
GOWN SRG XL XLNG 56XLVL 4 (GOWN DISPOSABLE) ×2 IMPLANT
GOWN STRL NON-REIN XL XLG LVL4 (GOWN DISPOSABLE) ×4
KIT BASIN OR (CUSTOM PROCEDURE TRAY) ×2 IMPLANT
KIT ROOM TURNOVER OR (KITS) ×2 IMPLANT
MANIFOLD NEPTUNE II (INSTRUMENTS) ×2 IMPLANT
NDL 18GX1X1/2 (RX/OR ONLY) (NEEDLE) ×1 IMPLANT
NEEDLE 18GX1X1/2 (RX/OR ONLY) (NEEDLE) IMPLANT
NS IRRIG 1000ML POUR BTL (IV SOLUTION) ×2 IMPLANT
PACK ARTHROSCOPY DSU (CUSTOM PROCEDURE TRAY) ×1 IMPLANT
PACK ORTHO EXTREMITY (CUSTOM PROCEDURE TRAY) ×2 IMPLANT
PAD ARMBOARD 7.5X6 YLW CONV (MISCELLANEOUS) ×4 IMPLANT
PAD CAST 4YDX4 CTTN HI CHSV (CAST SUPPLIES) ×1 IMPLANT
PADDING CAST COTTON 4X4 STRL (CAST SUPPLIES) ×2
PADDING CAST COTTON 6X4 STRL (CAST SUPPLIES) ×2 IMPLANT
SCREW CANN 7.3X60 16MM THRD (Screw) ×1 IMPLANT
SCREW CANN 7.3X65 16MM THRD (Screw) ×1 IMPLANT
SET ARTHROSCOPY TUBING (MISCELLANEOUS) ×2
SET ARTHROSCOPY TUBING LN (MISCELLANEOUS) ×1 IMPLANT
SPONGE GAUZE 4X4 12PLY (GAUZE/BANDAGES/DRESSINGS) ×2 IMPLANT
SPONGE LAP 18X18 X RAY DECT (DISPOSABLE) ×2 IMPLANT
SPONGE LAP 4X18 X RAY DECT (DISPOSABLE) ×1 IMPLANT
SUCTION FRAZIER TIP 10 FR DISP (SUCTIONS) ×2 IMPLANT
SUPER TURBO VAC 90 ×1 IMPLANT
SUT ETHILON 2 0 PSLX (SUTURE) ×5 IMPLANT
SUT ETHILON 4 0 PS 2 18 (SUTURE) ×1 IMPLANT
SUT MNCRL AB 3-0 PS2 18 (SUTURE) IMPLANT
SUT VIC AB 2-0 CT1 27 (SUTURE)
SUT VIC AB 2-0 CT1 TAPERPNT 27 (SUTURE) IMPLANT
SYR 20CC LL (SYRINGE) ×2 IMPLANT
TAPE STRIPS DRAPE STRL (GAUZE/BANDAGES/DRESSINGS) IMPLANT
TOWEL OR 17X24 6PK STRL BLUE (TOWEL DISPOSABLE) ×2 IMPLANT
TOWEL OR 17X26 10 PK STRL BLUE (TOWEL DISPOSABLE) ×2 IMPLANT
TUBE CONNECTING 12X1/4 (SUCTIONS) ×2 IMPLANT
WAND 30 DEG SABER W/CORD (SURGICAL WAND) IMPLANT
WATER STERILE IRR 1000ML POUR (IV SOLUTION) ×2 IMPLANT
WRAP KNEE MAXI GEL POST OP (GAUZE/BANDAGES/DRESSINGS) ×1 IMPLANT

## 2012-01-17 NOTE — Op Note (Signed)
OPERATIVE REPORT  DATE OF SURGERY: 01/17/2012  PATIENT:  Harold Bailey,  46 y.o. 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 He has failed conservative treatment has pain with activities of daily living and presents at this time for arthroscopic debridement and fusion of the left ankle. Risks and benefits were discussed including infection neurovascular injury nonhealing of the bone nonhealing of the skin need for additional surgery. Patient states he understands and states he wished to proceed at this time it was also reinforced to the patient that if he does continue to smoke that he has a higher risk of complications from the surgery. Description of procedure patient brought to OR room 15 and underwent a general anesthetic. After adequate levels of anesthesia were obtained patient's left lower extremity was prepped using DuraPrep and draped into a sterile field. The scope was inserted through the anteromedial portal and the working portal was established anterior laterally. The skin was incised blunt dissection was carried down to the capsule and blunt trochars were used to insert into the joint. Visualization showed delamination of the cartilage with significant traumatic arthritis. A shaver and vapor wand were used to debride the synovitis and initial cartilage debridement. The bur was then used to debride the cartilage back to bleeding viable subchondral bone. After complete debridement the ankle was placed in neutral dorsiflexion and a guidepin was inserted from the medial and lateral aspect of the tibia into  the talus. C-arm fluoroscopy verifying alignment. These were overdrilled and then fixed with 60 and 65 mm screws with 16 mm threads. C-arm possibly verified alignment of both AP and lateral planes. The wounds were irrigated with normal saline the incision was closed using 2-0 nylon the wounds were covered with Adaptic orthopedic sponges ABDs dressing Kerlix and Coban. Patient was extubated taken to the PACU in stable condition.  PLAN OF CARE: Admit to inpatient   PATIENT DISPOSITION:  PACU - hemodynamically stable.   Nadara Mustard, MD 01/17/2012 1:47 PM

## 2012-01-17 NOTE — H&P (Signed)
Harold Bailey is an 46 y.o. male.   Chief Complaint: Left ankle pain. HPI:  patient complains of pain with activities of daily living with traumatic arthritis left ankle. He has failed conservative treatment and presents at this time for left ankle fusion.   Past Medical History  Diagnosis Date  . Hypertension   . Shortness of breath     Hx of smoking.  Marland Kitchen GERD (gastroesophageal reflux disease)   . 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  . Arthritis     Left ankle  . Polycythemia     Past Surgical History  Procedure Date  . Ankle surgery 2003    Left  . Shoulder arthroscopy 2008    Left  . Brain surgery 2009    Biopsy  . Fracture surgery 2003    Left ankle  . Tympanostomy tube placement 1993    History reviewed. No pertinent family history. Social History:  reports that he has been smoking Cigarettes.  He has been smoking about 1.5 packs per day. He does not have any smokeless tobacco history on file. He reports that he drinks about 7.2 ounces of alcohol per week. His drug history not on file.  Allergies: No Known Allergies  Medications Prior to Admission  Medication Sig Dispense Refill  . ALPRAZolam (XANAX) 0.5 MG tablet Take 1-2 mg by mouth at bedtime as needed. For sleep      . bisoprolol-hydrochlorothiazide (ZIAC) 10-6.25 MG per tablet Take 1 tablet by mouth daily.      . celecoxib (CELEBREX) 200 MG capsule Take 200 mg by mouth daily.      . fish oil-omega-3 fatty acids 1000 MG capsule Take 2 g by mouth daily.      . Flaxseed, Linseed, (EQL FLAX SEED OIL) 1000 MG CAPS Take 2 capsules by mouth daily.      Marland Kitchen gabapentin (NEURONTIN) 300 MG capsule Take 300-600 mg by mouth at bedtime.      Marland Kitchen HYDROcodone-acetaminophen (LORTAB) 7.5-500 MG per tablet Take 1 tablet by mouth 3 (three) times daily as needed. For pain      . ibuprofen (ADVIL,MOTRIN) 800 MG tablet Take 800 mg by mouth every 8 (eight) hours as needed. For  headache      . Misc Natural Products (OSTEO BI-FLEX ADV JOINT SHIELD PO) Take 2 tablets by mouth daily.      . Multiple Vitamin (MULITIVITAMIN WITH MINERALS) TABS Take 1 tablet by mouth daily.      Marland Kitchen oxyCODONE-acetaminophen (PERCOCET) 10-325 MG per tablet Take 1 tablet by mouth every 4 (four) hours as needed. For pain      . diclofenac sodium (VOLTAREN) 1 % GEL Apply 1 application topically 4 (four) times daily as needed. Ankle pain        No results found for this or any previous visit (from the past 48 hour(s)). No results found.  Review of Systems  All other systems reviewed and are negative.    Blood pressure 133/97, pulse 92, temperature 97.3 F (36.3 C), temperature source Oral, resp. rate 18, SpO2 95.00%. Physical Exam  On examination patient has well-healed scars around the ankle. The ankle is tender to palpation over the anterior medial and lateral joint lines. He has decreased range of motion with equinus contracture. He has good subtalar motion and good dorsalis pedis pulse. Patient is currently a smoker and again discussed the importance of [place] stopping smoking. Discussed that with  smoking he is at increased risk of infection nonhealing of the fusion. Patient states he understands and wished to proceed with surgery at this time. Assessment/Plan Assessment: Traumatic arthritis left ankle. Plan: We'll plan for arthroscopic fusion. Risks and benefits were discussed including infection neurovascular injury persistent pain DVT pulmonary embolus need for additional surgery. Patient states he understands and wishes to proceed at this time.  Lavonne Kinderman V 01/17/2012, 12:31 PM

## 2012-01-17 NOTE — Progress Notes (Signed)
Pt. States he drank a 6 pack of beer last night. States he's been drinking  a 6 pack every night since the last 3 months  Or a little longer due to the pain  In the ankle.

## 2012-01-17 NOTE — Anesthesia Preprocedure Evaluation (Addendum)
Anesthesia Evaluation  Patient identified by MRN, date of birth, ID band Patient awake    Reviewed: Allergy & Precautions, H&P , Patient's Chart, lab work & pertinent test results  Airway Mallampati: II TM Distance: >3 FB Neck ROM: Full    Dental  (+) Edentulous Upper, Edentulous Lower and Dental Advisory Given   Pulmonary shortness of breath, Current Smoker,  1 1/2 ppd x 30 years breath sounds clear to auscultation        Cardiovascular hypertension, Rhythm:Regular Rate:Normal     Neuro/Psych  Headaches, ? 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  ETOH - pt states he drinks 6- 12 beers/day    GI/Hepatic GERD-  ,  Endo/Other    Renal/GU      Musculoskeletal   Abdominal   Peds  Hematology   Anesthesia Other Findings   Reproductive/Obstetrics                       Anesthesia Physical Anesthesia Plan  ASA: III  Anesthesia Plan: General   Post-op Pain Management:    Induction: Intravenous  Airway Management Planned: Oral ETT  Additional Equipment:   Intra-op Plan:   Post-operative Plan: Extubation in OR  Informed Consent: I have reviewed the patients History and Physical, chart, labs and discussed the procedure including the risks, benefits and alternatives for the proposed anesthesia with the patient or authorized representative who has indicated his/her understanding and acceptance.     Plan Discussed with:   Anesthesia Plan Comments: (Nonunion L. Ankle Etoh abuse Smoker GERD Htn  Plan GA with popliteal block  Kipp Brood, MD)        Anesthesia Quick Evaluation

## 2012-01-17 NOTE — Anesthesia Postprocedure Evaluation (Signed)
  Anesthesia Post-op Note  Patient: Harold Bailey  Procedure(s) Performed: Procedure(s) (LRB): ANKLE ARTHROSCOPY (Left) ANKLE FUSION (Left)  Patient Location: PACU  Anesthesia Type: General and GA combined with regional for post-op pain  Level of Consciousness: awake, alert  and oriented  Airway and Oxygen Therapy: Patient Spontanous Breathing and Patient connected to nasal cannula oxygen  Post-op Pain: none  Post-op Assessment: Post-op Vital signs reviewed and Patient's Cardiovascular Status Stable  Post-op Vital Signs: stable  Complications: No apparent anesthesia complications

## 2012-01-17 NOTE — Progress Notes (Signed)
ANTICOAGULATION CONSULT NOTE - Initial Consult  Pharmacy Consult for Coumadin Indication: VTE prophylaxis  No Known Allergies  Patient Measurements: Weight: 222 lb 3.6 oz (100.8 kg) Heparin Dosing Weight:   Vital Signs: Temp: 97.9 F (36.6 C) (04/26 1400) Temp src: Oral (04/26 1039) BP: 112/60 mmHg (04/26 1500) Pulse Rate: 77  (04/26 1500)  Labs: No results found for this basename: HGB:2,HCT:3,PLT:3,APTT:3,LABPROT:3,INR:3,HEPARINUNFRC:3,CREATININE:3,CKTOTAL:3,CKMB:3,TROPONINI:3 in the last 72 hours CrCl is unknown because there is no height on file for the current visit.  Medical History: Past Medical History  Diagnosis Date  . Hypertension   . Shortness of breath     Hx of smoking.  Marland Kitchen GERD (gastroesophageal reflux disease)   . 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  . Arthritis     Left ankle  . Polycythemia     Medications:  Prescriptions prior to admission  Medication Sig Dispense Refill  . ALPRAZolam (XANAX) 0.5 MG tablet Take 1-2 mg by mouth at bedtime as needed. For sleep      . bisoprolol-hydrochlorothiazide (ZIAC) 10-6.25 MG per tablet Take 1 tablet by mouth daily.      . celecoxib (CELEBREX) 200 MG capsule Take 200 mg by mouth daily.      . fish oil-omega-3 fatty acids 1000 MG capsule Take 2 g by mouth daily.      . Flaxseed, Linseed, (EQL FLAX SEED OIL) 1000 MG CAPS Take 2 capsules by mouth daily.      Marland Kitchen gabapentin (NEURONTIN) 300 MG capsule Take 300-600 mg by mouth at bedtime.      Marland Kitchen HYDROcodone-acetaminophen (LORTAB) 7.5-500 MG per tablet Take 1 tablet by mouth 3 (three) times daily as needed. For pain      . ibuprofen (ADVIL,MOTRIN) 800 MG tablet Take 800 mg by mouth every 8 (eight) hours as needed. For headache      . Misc Natural Products (OSTEO BI-FLEX ADV JOINT SHIELD PO) Take 2 tablets by mouth daily.      . Multiple Vitamin (MULITIVITAMIN WITH MINERALS) TABS Take 1 tablet by mouth daily.      Marland Kitchen  oxyCODONE-acetaminophen (PERCOCET) 10-325 MG per tablet Take 1 tablet by mouth every 4 (four) hours as needed. For pain      . diclofenac sodium (VOLTAREN) 1 % GEL Apply 1 application topically 4 (four) times daily as needed. Ankle pain        Assessment: 46yom s/p ankle fusion to begin Coumadin for VTE prophylaxis.  - Baseline INR: 0.91, AST/ALT slightly elevated (32/59) - Warfarin points: 8 - No significant bleeding reported  Goal of Therapy:  INR 2-3   Plan:  1. Coumadin 10mg  po x 1 today 2. Daily PT/INR 3. Coumadin educational book/video  Cleon Dew [phone] 01/17/2012,3:56 PM

## 2012-01-17 NOTE — Transfer of Care (Signed)
Immediate Anesthesia Transfer of Care Note  Patient: Harold Bailey  Procedure(s) Performed: Procedure(s) (LRB): ANKLE ARTHROSCOPY (Left) ANKLE FUSION (Left)  Patient Location: PACU  Anesthesia Type: General  Level of Consciousness: awake, alert , oriented and patient cooperative  Airway & Oxygen Therapy: Patient Spontanous Breathing and Patient connected to nasal cannula oxygen  Post-op Assessment: Report given to PACU RN and Post -op Vital signs reviewed and stable  Post vital signs: Reviewed  Complications: No apparent anesthesia complications

## 2012-01-17 NOTE — Preoperative (Signed)
Beta Blockers   Reason not to administer Beta Blockers:Not Applicable 

## 2012-01-17 NOTE — Anesthesia Procedure Notes (Addendum)
Anesthesia Regional Block:  Popliteal block  Pre-Anesthetic Checklist: ,, timeout performed, Correct Patient, Correct Site, Correct Laterality, Correct Procedure, Correct Position, site marked, Risks and benefits discussed,  Surgical consent,  Pre-op evaluation,  At surgeon's request and post-op pain management  Laterality: Left  Prep: chloraprep       Needles:   Needle Type: Echogenic Stimulator Needle      Needle Gauge: 22 and 22 G    Additional Needles:  Procedures: ultrasound guided and nerve stimulator Popliteal block Narrative:  Start time: 01/17/2012 11:50 AM End time: 01/17/2012 12:05 PM  Performed by: Personally   Additional Notes: 25 cc 0.5% Marcaine 1:200 Epi injected around popliteal nerve at bifurcation with US guidance.  15 cc 0.5% naropin injected around saphenous nerve in adductor canal with US guidance.  Harold Brood, MD    Popliteal block Procedure Name: Intubation Date/Time: 01/17/2012 12:44 PM Performed by: Lovie Chol Pre-anesthesia Checklist: Patient identified, Emergency Drugs available, Suction available and Patient being monitored Patient Re-evaluated:Patient Re-evaluated prior to inductionOxygen Delivery Method: Circle system utilized Preoxygenation: Pre-oxygenation with 100% oxygen Intubation Type: IV induction Ventilation: Two handed mask ventilation required and Oral airway inserted - appropriate to patient size Laryngoscope Size: Hyacinth Meeker and 3 Grade View: Grade I Tube type: Oral Tube size: 7.5 mm Number of attempts: 1 Airway Equipment and Method: Stylet and LTA kit utilized Placement Confirmation: ETT inserted through vocal cords under direct vision,  positive ETCO2 and breath sounds checked- equal and bilateral Secured at: 23 cm Tube secured with: Tape Dental Injury: Teeth and Oropharynx as per pre-operative assessment

## 2012-01-18 MED ORDER — PATIENT'S GUIDE TO USING COUMADIN BOOK
Freq: Once | Status: AC
  Administered 2012-01-18: 18:00:00
  Filled 2012-01-18: qty 1

## 2012-01-18 MED ORDER — WARFARIN VIDEO: Freq: Once | Status: DC

## 2012-01-18 MED ORDER — WARFARIN SODIUM 10 MG PO TABS
10.0000 mg | ORAL_TABLET | Freq: Once | ORAL | Status: AC
  Administered 2012-01-18: 10 mg via ORAL
  Filled 2012-01-18: qty 1

## 2012-01-18 NOTE — Progress Notes (Signed)
ANTICOAGULATION CONSULT NOTE -follow up Pharmacy Consult for Coumadin Indication: VTE prophylaxis  No Known Allergies  Patient Measurements: Weight: 222 lb 3.6 oz (100.8 kg) Heparin Dosing Weight:   Vital Signs: Temp: 98.4 F (36.9 C) (04/27 1356) Temp src: Oral (04/27 1356) BP: 110/72 mmHg (04/27 1356) Pulse Rate: 83  (04/27 1356)  Labs:  Basename 01/18/12 0503  HGB --  HCT --  PLT --  APTT --  LABPROT 12.2  INR 0.89  HEPARINUNFRC --  CREATININE --  CKTOTAL --  CKMB --  TROPONINI --   CrCl is unknown because there is no height on file for the current visit.  Assessment: 46yom s/p ankle fusion POD 1 on Coumadin for VTE prophylaxis.  - INR: 0.89 after first dose of 10mg  coumadin, AST/ALT slightly elevated (32/59) - Warfarin points: 8 - No significant bleeding reported  Goal of Therapy:  INR 2-3   Plan:  1.repeat Coumadin 10mg  po x 1 today 2. Daily PT/INR 3. Coumadin educational book/video re-ordered today b/c not charted as given yesterday  Herby Abraham, Pharm.D. [phone] 01/18/2012 2:25 PM

## 2012-01-18 NOTE — Evaluation (Signed)
Physical Therapy Evaluation Patient Details Name: Harold Bailey MRN: [id number] DOB: December 26, 1965 Today's Date: 01/18/2012 Time: 8657-8469 PT Time Calculation (min): 20 min  PT Assessment / Plan / Recommendation Clinical Impression  Pt is a 46 y.o. male who underwent L ankle fusion 01-17-12.  Skilled PT intervention indicated to progress mobility/gait and provide education with LTG of return home independent.    PT Assessment  Patient needs continued PT services    Follow Up Recommendations  Skilled nursing facility    Equipment Recommendations  Defer to next venue    Frequency Min 5X/week    Precautions / Restrictions Precautions Required Braces or Orthoses: Other Brace/Splint Other Brace/Splint: CAM boot Restrictions LLE Weight Bearing: Non weight bearing          Mobility  Bed Mobility Bed Mobility: Sit to Supine;Supine to Sit Supine to Sit: 6: Modified independent (Device/Increase time) Sit to Supine: 6: Modified independent (Device/Increase time) Transfers Transfers: Sit to Stand;Stand to [employer] Transfers Sit to Stand: 4: Min guard;With upper extremity assist;From bed Stand to Sit: 4: Min guard;With upper extremity assist;To bed Stand Pivot Transfers: 4: Min assist;With armrests Details for Transfer Assistance: verbal cues for sequencing, safety.  Pt is impulsive. Ambulation/Gait Ambulation/Gait Assistance: 4: Min guard Ambulation Distance (Feet): 25 Feet Assistive device: Rolling walker Ambulation/Gait Assistance Details: verbal cues to slow down and not push RW too far out in front Gait Pattern: Step-to pattern General Gait Details: unsteady, impulsive, decreased safety awareness    Exercises     PT Goals Acute Rehab PT Goals PT Goal Formulation: With patient Time For Goal Achievement: 01/25/12 Potential to Achieve Goals: Good Pt will go Sit to Stand: with supervision PT Goal: Sit to Stand - Progress: Goal set today Pt will go Stand to Sit:  with supervision PT Goal: Stand to Sit - Progress: Goal set today Pt will Transfer Bed to Chair/Chair to Bed: with supervision PT Transfer Goal: Bed to Chair/Chair to Bed - Progress: Goal set today Pt will Ambulate: 16 - 50 feet;with supervision;with least restrictive assistive device PT Goal: Ambulate - Progress: Goal set today  Visit Information  Last PT Received On: 01/18/12 Assistance Needed: +1    Subjective Data  Subjective: "It's not too bad." Patient Stated Goal: return home   Prior Functioning  Home Living Lives With: Alone Available Help at Discharge: Family;Neighbor;Available PRN/intermittently Type of Home: House Home Access: Stairs to enter [employer] of Steps: 3 Entrance Stairs-Rails: None Home Layout: One level Home Adaptive Equipment: None Prior Function Level of Independence: Independent Able to Take Stairs?: Yes Driving: Yes Communication Communication: No difficulties    Cognition  Overall Cognitive Status: Appears within functional limits for tasks assessed/performed Arousal/Alertness: Awake/alert Orientation Level: Appears intact for tasks assessed Behavior During Session: [facility] for tasks performed Cognition - Other Comments: 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    Extremity/Trunk Assessment     Balance    End of Session PT - End of Session Equipment Utilized During Treatment: Gait belt;Other (comment) (L CAM walking boot) Activity Tolerance: Patient tolerated treatment well Patient left: in bed;with call bell/phone within reach Nurse Communication: Mobility status   Ilda Foil 01/18/2012, 3:08 PM  Aida Raider, PT  Office # [phone] Pager [phone]

## 2012-01-18 NOTE — Progress Notes (Signed)
Orthopedic Tech Progress Note Patient Details:  Harold Bailey 1966-05-10 [id number]  Other Ortho Devices Type of Ortho Device: CAM walker Ortho Device Location: left LE Ortho Device Interventions: Application   [name] T 01/18/2012, 11:15 AM

## 2012-01-18 NOTE — Progress Notes (Signed)
Patient ID: Harold Bailey, male   DOB: 1965-10-15, 46 y.o.   MRN: [id number] Patient is postoperative day 1 left ankle fusion. He states that there is no debris at home to care for him at discharge and patient request evaluation for skilled nursing placement. Fracture boot ordered today for gait training nonweightbearing on the left.

## 2012-01-19 LAB — PROTIME-INR: Prothrombin Time: 14.4 seconds (ref 11.6–15.2)

## 2012-01-19 MED ORDER — WARFARIN SODIUM 10 MG PO TABS
10.0000 mg | ORAL_TABLET | Freq: Once | ORAL | Status: AC
  Administered 2012-01-19: 10 mg via ORAL
  Filled 2012-01-19: qty 1

## 2012-01-19 NOTE — Progress Notes (Signed)
Clinical Social Work Department BRIEF PSYCHOSOCIAL ASSESSMENT 01/19/2012  Patient:  Harold Bailey, Harold Bailey     Account Number:  [id number]     Admit date:  01/17/2012  Clinical Social Worker:  Skip Mayer  Date/Time:  01/19/2012 03:00 PM  Referred by:  Physician  Date Referred:  01/19/2012 Referred for  SNF Placement   Other Referral:   Interview type:  Patient Other interview type:   Pt's mother at bedside during visit    PSYCHOSOCIAL DATA Living Status:  FAMILY Admitted from facility:   Level of care:   Primary support name:  Harold Bailey Primary support relationship to patient:  SPOUSE Degree of support available:   adequate, per pt    CURRENT CONCERNS Current Concerns  Post-Acute Placement   Other Concerns:    SOCIAL WORK ASSESSMENT / PLAN CSW met with pt and pt's family re: PT recommendation for SNF. CSW explained placement process and answere questions. CSW will comlpete [facility] and initiate SNF search. Weekday CSW to f/u with offers. Pt reports preference is [facility].   Assessment/plan status:  Information/Referral to [facility] Other assessment/ plan:   Information/referral to community resources:   SNF    PATIENT'S/FAMILY'S RESPONSE TO PLAN OF CARE: Pt reports agreeable to SNF in order to increase strength and independece with mobility. Pt verbalized understanding of d/c plan and appreciation for CSW assist.        Dellie Burns, MSW, [employer] [phone]

## 2012-01-19 NOTE — Progress Notes (Signed)
ANTICOAGULATION CONSULT NOTE -follow up Pharmacy Consult for Coumadin Indication: VTE prophylaxis  No Known Allergies  Patient Measurements: Weight: 222 lb 3.6 oz (100.8 kg) Heparin Dosing Weight:   Vital Signs: Temp: 97.4 F (36.3 C) (04/28 0645) Temp src: Axillary (04/28 0645) BP: 135/109 mmHg (04/28 0645) Pulse Rate: 85  (04/28 0645)  Labs:  Alvira Philips 01/19/12 0700 01/18/12 0503  HGB -- --  HCT -- --  PLT -- --  APTT -- --  LABPROT 14.4 12.2  INR 1.10 0.89  HEPARINUNFRC -- --  CREATININE -- --  CKTOTAL -- --  CKMB -- --  TROPONINI -- --   CrCl is unknown because there is no height on file for the current visit.  Assessment: 46yom s/p ankle fusion POD 2 on Coumadin for VTE prophylaxis.  - INR: 1.1 after 2 doses of 10mg  coumadin, - Warfarin points: 8 - No significant bleeding reported  Goal of Therapy:  INR 2-3   Plan:  1.repeat Coumadin 10mg  po x 1 today 2. Daily PT/INR 3. Coumadin educational book given yesterday 4. Plan is dc to SNF for rehab Herby Abraham, Pharm.D. [phone] 01/19/2012 12:12 PM

## 2012-01-19 NOTE — Progress Notes (Signed)
Physical Therapy Treatment Patient Details Name: Harold Bailey MRN: [id number] DOB: March 11, 1966 Today's Date: 01/19/2012 Time: 1017-1040 PT Time Calculation (min): 23 min  PT Assessment / Plan / Recommendation Comments on Treatment Session  Pt admitted s/p left ankle fusion and is progressing.  Pt limited by premorbid decreased safety awareness with mobility.  However, pt motivated and is able to increase ambulation distance today as well as negotiate stairs.    Follow Up Recommendations  Skilled nursing facility    Equipment Recommendations  Defer to next venue    Frequency Min 5X/week   Plan Discharge plan remains appropriate;Frequency remains appropriate    Precautions / Restrictions Precautions Precautions: Fall Required Braces or Orthoses: Other Brace/Splint Other Brace/Splint: Cam Walker left LE. Restrictions Weight Bearing Restrictions: Yes LLE Weight Bearing: Non weight bearing    Pertinent Vitals/Pain 5/10 in left ankle.  RN made aware and pt repositioned.    Mobility  Bed Mobility Bed Mobility: Not assessed Transfers Transfers: Sit to Stand;Stand to Sit Sit to Stand: 5: Supervision;With upper extremity assist;From bed (2 trials.) Stand to Sit: 5: Supervision;With upper extremity assist;To bed (2 trials.) Details for Transfer Assistance: Cues for safest hand placement on bed and not on RW.  Pt with slight impulsivity, but able to verbalize safe hand placement after education. Ambulation/Gait Ambulation/Gait Assistance: 4: Min guard Ambulation Distance (Feet): 170 Feet (20 feet and then 150 feet.) Assistive device: Rolling walker Ambulation/Gait Assistance Details: Guarding for balance due to slight impulsivity as well as decreased safety awareness with RW.  Cues and education for pt to stay closer to front of RW as well as decrease step length while slowing velocity. Gait Pattern: Step-to pattern (Increased step length.) Gait velocity: Unsafe, erradict  velocity. Stairs: Yes Stairs Assistance: 4: Min assist Stairs Assistance Details (indicate cue type and reason): Assist for safety and balance with cues for sequence. Stair Management Technique: Step to pattern;Backwards;With walker Number of Stairs: 1  Wheelchair Mobility Wheelchair Mobility: No    Exercises     PT Goals Acute Rehab PT Goals PT Goal Formulation: With patient Time For Goal Achievement: 01/25/12 Potential to Achieve Goals: Good PT Goal: Sit to Stand - Progress: Met PT Goal: Stand to Sit - Progress: Met PT Goal: Ambulate - Progress: Progressing toward goal  Visit Information  Last PT Received On: 01/19/12 Assistance Needed: +1    Subjective Data  Subjective: "I am ready to give this a go." Patient Stated Goal: return home   Cognition  Overall Cognitive Status: Appears within functional limits for tasks assessed/performed Arousal/Alertness: Awake/alert Orientation Level: Appears intact for tasks assessed Behavior During Session: [facility] for tasks performed Cognition - Other Comments: 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    Balance  Balance Balance Assessed: No  End of Session PT - End of Session Equipment Utilized During Treatment: Gait belt;Other (comment) (Cam Walker left LE.) Activity Tolerance: Patient tolerated treatment well Patient left: in bed;with call bell/phone within reach (Sitting EOB.) Nurse Communication: Mobility status;Weight bearing status;Patient requests pain meds    Cephus Shelling 01/19/2012, 2:02 PM  01/19/2012 Cephus Shelling, PT, DPT [phone]

## 2012-01-19 NOTE — Progress Notes (Addendum)
Clinical Social Work Department CLINICAL SOCIAL WORK PLACEMENT NOTE 01/19/2012  Patient:  Harold Bailey, Harold Bailey  Account Number:  [id number] Admit date:  01/17/2012  Clinical Social Worker:  Dellie Burns, Theresia Majors  Date/time:  01/19/2012 03:00 PM  Clinical Social Work is seeking post-discharge placement for this patient at the following level of care:   SKILLED NURSING   (*CSW will update this form in Epic as items are completed)   01/19/2012  Patient/family provided with [facility] Department of Clinical Social Work's list of facilities offering this level of care within the geographic area requested by the patient (or if unable, by the patient's family).  01/19/2012  Patient/family informed of their freedom to choose among providers that offer the needed level of care, that participate in Medicare, Medicaid or managed care program needed by the patient, have an available bed and are willing to accept the patient.  01/19/2012  Patient/family informed of [facility]' ownership interest in [facility], as well as of the fact that they are under no obligation to receive care at this facility.  PASARR submitted to EDS on 01/19/2012 PASARR number received from EDS on 01/20/12  FL2 transmitted to all facilities in geographic area requested by pt/family on  01/19/2012 FL2 transmitted to all facilities within larger geographic area on   Patient informed that his/her managed care company has contracts with or will negotiate with  certain facilities, including the following:     Patient/family informed of bed offers received: 01/20/12  Patient chooses bed at [facility] Physician recommends and patient chooses bed at SNF    Patient to be transferred to  on  01/20/12 Patient to be transferred to facility by [facility]  The following physician request were entered in Epic:   Additional Comments: Dellie Burns, MSW, LCSWA [phone] (weekend)

## 2012-01-19 NOTE — Progress Notes (Signed)
Subjective: 2 Days Post-Op Procedure(s) (LRB): ANKLE ARTHROSCOPY (Left) ANKLE FUSION (Left) Patient reports pain as moderate.    Objective: Vital signs in last 24 hours: Temp:  [97.4 F (36.3 C)-98.4 F (36.9 C)] 97.4 F (36.3 C) (04/28 0645) Pulse Rate:  [74-85] 85  (04/28 0645) Resp:  [18-20] 18  (04/28 0645) BP: (110-135)/(60-109) 135/109 mmHg (04/28 0645) SpO2:  [96 %-97 %] 97 % (04/28 0645)  Intake/Output from previous day: 04/27 0701 - 04/28 0700 In: 1740 [P.O.:1740] Out: 3100 [Urine:3100] Intake/Output this shift:    No results found for this basename: HGB:5 in the last 72 hours No results found for this basename: WBC:2,RBC:2,HCT:2,PLT:2 in the last 72 hours No results found for this basename: NA:2,K:2,CL:2,CO2:2,BUN:2,CREATININE:2,GLUCOSE:2,CALCIUM:2 in the last 72 hours  Basename 01/18/12 0503  LABPT --  INR 0.89    Neurologically intact  Assessment/Plan: 2 Days Post-Op Procedure(s) (LRB): ANKLE ARTHROSCOPY (Left) ANKLE FUSION (Left) Discharge to SNF  Harold Bailey V 01/19/2012, 7:57 AM

## 2012-01-20 ENCOUNTER — Encounter (HOSPITAL_COMMUNITY): Payer: Self-pay | Admitting: Orthopedic Surgery

## 2012-01-20 MED ORDER — WARFARIN SODIUM 7.5 MG PO TABS
7.5000 mg | ORAL_TABLET | Freq: Once | ORAL | Status: DC
  Filled 2012-01-20: qty 1

## 2012-01-20 MED ORDER — OXYCODONE-ACETAMINOPHEN 5-325 MG PO TABS: 1.0000 | ORAL_TABLET | ORAL | Status: AC | PRN

## 2012-01-20 MED FILL — Midazolam HCl Inj 2 MG/2ML (Base Equivalent): INTRAMUSCULAR | Qty: 2 | Status: AC

## 2012-01-20 MED FILL — Hydromorphone HCl Inj 1 MG/ML: INTRAMUSCULAR | Qty: 1 | Status: AC

## 2012-01-20 NOTE — Progress Notes (Deleted)
CSW presented b/o to pt. Pt declined both offers for SNF and reports he will d/c home with his dtr and HH. RNCM aware. Pt's dtr to provide transport home. CSW signing off as no other CSW needs identified.  Dellie Burns, MSW, LCSWA [phone] (covering)

## 2012-01-20 NOTE — Progress Notes (Signed)
Physical Therapy Treatment Patient Details Name: Harold Bailey MRN: [id number] DOB: [birth year] Today's Date: 01/20/2012 Time: 4098-1191 PT Time Calculation (min): 18 min  PT Assessment / Plan / Recommendation Comments on Treatment Session  Pt admitted s/p left ankle fusion and continues to progress well.  Pt would benefit from further PT to address decreased independence and decreased safety with mobility.    Follow Up Recommendations  Skilled nursing facility    Equipment Recommendations  Defer to next venue    Frequency Min 5X/week   Plan Discharge plan remains appropriate;Frequency remains appropriate    Precautions / Restrictions Precautions Precautions: Fall Required Braces or Orthoses: Other Brace/Splint Other Brace/Splint: Cam Walker left LE. Restrictions Weight Bearing Restrictions: Yes LLE Weight Bearing: Non weight bearing    Pertinent Vitals/Pain 5/10 in left ankle.  RN made aware and pt repositioned.    Mobility  Bed Mobility Bed Mobility: Not assessed Transfers Transfers: Sit to Stand;Stand to Sit Sit to Stand: 5: Supervision;With upper extremity assist;From bed Stand to Sit: 5: Supervision;With upper extremity assist;To bed Details for Transfer Assistance: Cues for continued safest placement of hands on surface versus pulling up on RW. Ambulation/Gait Ambulation/Gait Assistance: 4: Min guard Ambulation Distance (Feet): 200 Feet Assistive device: Rolling walker Ambulation/Gait Assistance Details: Guarding for balance due to continued impulsivity as well as decreased safety awareness with RW.  Cues to keep RW closer to self and to stay close to front of RW. Gait Pattern: Step-to pattern Gait velocity: Unsafe, erradict velocity. Stairs: No Wheelchair Mobility Wheelchair Mobility: No    Exercises     PT Goals Acute Rehab PT Goals PT Goal Formulation: With patient Time For Goal Achievement: 01/25/12 Potential to Achieve Goals: Good PT Goal: Sit to  Stand - Progress: Met PT Goal: Stand to Sit - Progress: Met PT Goal: Ambulate - Progress: Progressing toward goal  Visit Information  Last PT Received On: 01/20/12 Assistance Needed: +1    Subjective Data  Subjective: "I believe I am leaving today." Patient Stated Goal: return home   Cognition  Overall Cognitive Status: Appears within functional limits for tasks assessed/performed Arousal/Alertness: Awake/alert Orientation Level: Appears intact for tasks assessed Behavior During Session: [facility] for tasks performed Cognition - Other Comments: 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    Balance  Balance Balance Assessed: No  End of Session PT - End of Session Equipment Utilized During Treatment: Gait belt;Other (comment) (Cam Walker left LE.) Activity Tolerance: Patient tolerated treatment well Patient left: in bed;with call bell/phone within reach (Sitting EOB.) Nurse Communication: Mobility status;Patient requests pain meds;Weight bearing status    Cephus Shelling 01/20/2012, 1:25 PM  01/20/2012 Cephus Shelling, PT, DPT [phone]

## 2012-01-20 NOTE — Discharge Summary (Signed)
Physician Discharge Summary  Patient ID: Harold Bailey MRN: [id number] DOB/AGE: Feb 08, 1966 46 y.o.  Admit date: 01/17/2012 Discharge date: 01/20/2012  Admission Diagnoses: Traumatic arthritis left ankle  Discharge Diagnoses: Same Active Problems:  * No active hospital problems. *    Discharged Condition: stable  Hospital Course: Patient's hospital course was essentially unremarkable he underwent left ankle arthroscopic fusion. Postoperatively patient progressed slowly with physical therapy and requested discharge to skilled nursing facility. Anticipate skilled nursing placement for approximately 3-4 weeks.  Consults: None  Significant Diagnostic Studies: labs: Routine labs  Treatments: surgery: Please see operative note for arthroscopic fusion left ankle  Discharge Exam: Blood pressure 105/67, pulse 76, temperature 97.9 F (36.6 C), temperature source Oral, resp. rate 18, weight 100.8 kg (222 lb 3.6 oz), SpO2 98.00%. Incision/Wound: incision clean and dry at time of discharge  Disposition:   Discharge Orders    Future Orders Please Complete By Expires   Diet - low sodium heart healthy      Call MD / Call 911      Comments:   If you experience chest pain or shortness of breath, CALL 911 and be transported to the hospital emergency room.  If you develope a fever above 101 F, pus (white drainage) or increased drainage or redness at the wound, or calf pain, call your surgeon's office.   Constipation Prevention      Comments:   Drink plenty of fluids.  Prune juice may be helpful.  You may use a stool softener, such as Colace (over the counter) 100 mg twice a day.  Use MiraLax (over the counter) for constipation as needed.   Increase activity slowly as tolerated      Weight Bearing as taught in Physical Therapy      Comments:   Use a walker or crutches as instructed.     Medication List  As of 01/20/2012  6:36 AM   TAKE these medications         ALPRAZolam 0.5 MG tablet     Commonly known as: XANAX   Take 1-2 mg by mouth at bedtime as needed. For sleep      bisoprolol-hydrochlorothiazide 10-6.25 MG per tablet   Commonly known as: ZIAC   Take 1 tablet by mouth daily.      celecoxib 200 MG capsule   Commonly known as: CELEBREX   Take 200 mg by mouth daily.      EQL FLAX SEED OIL 1000 MG Caps   Take 2 capsules by mouth daily.      fish oil-omega-3 fatty acids 1000 MG capsule   Take 2 g by mouth daily.      gabapentin 300 MG capsule   Commonly known as: NEURONTIN   Take 300-600 mg by mouth at bedtime.      HYDROcodone-acetaminophen 7.5-500 MG per tablet   Commonly known as: LORTAB   Take 1 tablet by mouth 3 (three) times daily as needed. For pain      ibuprofen 800 MG tablet   Commonly known as: ADVIL,MOTRIN   Take 800 mg by mouth every 8 (eight) hours as needed. For headache      mulitivitamin with minerals Tabs   Take 1 tablet by mouth daily.      OSTEO BI-FLEX ADV JOINT SHIELD PO   Take 2 tablets by mouth daily.      oxyCODONE-acetaminophen 5-325 MG per tablet   Commonly known as: PERCOCET   Take 1 tablet by mouth every 4 (four)  hours as needed for pain.      oxyCODONE-acetaminophen 10-325 MG per tablet   Commonly known as: PERCOCET   Take 1 tablet by mouth every 4 (four) hours as needed. For pain      VOLTAREN 1 % Gel   Generic drug: diclofenac sodium   Apply 1 application topically 4 (four) times daily as needed. Ankle pain           Follow-up Information    Follow up with Angell Pincock V, MD in 2 weeks.   Contact information:   [address] [place] [address] [phone]          Signed: Nadara Mustard 01/20/2012, 6:36 AM

## 2012-01-20 NOTE — Progress Notes (Signed)
Pt to transfer to [facility] today via [facility]. Pt reports he will notify his family of d/c. No other CSW needs reported or noted. CSW signing off. Dellie Burns, MSW, LCSWA [phone] (cover)

## 2012-01-20 NOTE — Progress Notes (Signed)
Patient ID: Harold Bailey, male   DOB: August 26, 1966, 46 y.o.   MRN: [id number] Plan for discharge to skilled nursing facility when bed is available. Prescriptions on the chart for Percocet.

## 2012-01-20 NOTE — Discharge Instructions (Signed)
Physical therapy progressive ambulation nonweightbearing on left lower extremity

## 2012-01-20 NOTE — Progress Notes (Signed)
Pt D/C to SNF via EMS. 

## 2012-01-20 NOTE — Progress Notes (Signed)
ANTICOAGULATION CONSULT NOTE - Follow Up Consult  Pharmacy Consult for Coumadin Indication: VTE prophylaxis  No Known Allergies  Patient Measurements: Weight: 222 lb 3.6 oz (100.8 kg) Heparin Dosing Weight:    Vital Signs: Temp: 97.9 F (36.6 C) (04/29 0531) Temp src: Oral (04/29 0531) BP: 105/67 mmHg (04/29 0531) Pulse Rate: 76  (04/29 0531)  Labs:  Basename 01/20/12 0600 01/19/12 0700 01/18/12 0503  HGB -- -- --  HCT -- -- --  PLT -- -- --  APTT -- -- --  LABPROT 16.6* 14.4 12.2  INR 1.32 1.10 0.89  HEPARINUNFRC -- -- --  CREATININE -- -- --  CKTOTAL -- -- --  CKMB -- -- --  TROPONINI -- -- --   CrCl is unknown because there is no height on file for the current visit.  Assessment: L ankle fusion 4/26 Coumadin for VTE prophylaxis.  INR: up to  1.32   Cardiovascular: HTN on bisoprolol, hctz. Max BP 136/88  Goal of Therapy:  INR 2-3  Goal of Therapy:  INR 2-3   Plan:  1. SNF when available. 2. NOTE: Coumadin not dictated to be continued at discharge. 3. Continue Coumadin 7.5mg  po x 1 today here in hospital.  Misty Stanley Stillinger 01/20/2012,8:32 AM

## 2012-03-10 ENCOUNTER — Encounter (HOSPITAL_COMMUNITY): Payer: Self-pay

## 2012-03-10 ENCOUNTER — Emergency Department (HOSPITAL_COMMUNITY): Payer: Medicare Other

## 2012-03-10 ENCOUNTER — Emergency Department (HOSPITAL_COMMUNITY)
Admission: EM | Admit: 2012-03-10 | Discharge: 2012-03-10 | Disposition: A | Discharge status: Home / Self Care | Payer: Medicare Other | Attending: Emergency Medicine | Admitting: Emergency Medicine

## 2012-03-10 DIAGNOSIS — S80219A Abrasion, unspecified knee, initial encounter: Secondary | ICD-10-CM

## 2012-03-10 DIAGNOSIS — M25579 Pain in unspecified ankle and joints of unspecified foot: Secondary | ICD-10-CM

## 2012-03-10 DIAGNOSIS — F17200 Nicotine dependence, unspecified, uncomplicated: Secondary | ICD-10-CM | POA: Insufficient documentation

## 2012-03-10 DIAGNOSIS — M19079 Primary osteoarthritis, unspecified ankle and foot: Secondary | ICD-10-CM | POA: Insufficient documentation

## 2012-03-10 DIAGNOSIS — I10 Essential (primary) hypertension: Secondary | ICD-10-CM | POA: Insufficient documentation

## 2012-03-10 DIAGNOSIS — K219 Gastro-esophageal reflux disease without esophagitis: Secondary | ICD-10-CM | POA: Insufficient documentation

## 2012-03-10 DIAGNOSIS — S0003XA Contusion of scalp, initial encounter: Secondary | ICD-10-CM | POA: Insufficient documentation

## 2012-03-10 DIAGNOSIS — S0081XA Abrasion of other part of head, initial encounter: Secondary | ICD-10-CM

## 2012-03-10 DIAGNOSIS — M25569 Pain in unspecified knee: Secondary | ICD-10-CM | POA: Insufficient documentation

## 2012-03-10 DIAGNOSIS — F1010 Alcohol abuse, uncomplicated: Secondary | ICD-10-CM | POA: Insufficient documentation

## 2012-03-10 NOTE — ED Notes (Signed)
[employer] at the bedside 

## 2012-03-10 NOTE — Discharge Instructions (Signed)
Return here for any worsening in your condition.  Followup to your  primary care Dr. for recheck.

## 2012-03-10 NOTE — ED Provider Notes (Signed)
Medical screening examination/treatment/procedure(s) were performed by non-physician practitioner and as supervising physician I was immediately available for consultation/collaboration.   Harold Isakson, MD 03/10/12 2317 
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
## 2012-06-08 ENCOUNTER — Emergency Department (HOSPITAL_COMMUNITY): Payer: Medicare Other

## 2012-06-08 ENCOUNTER — Emergency Department (HOSPITAL_COMMUNITY)
Admission: EM | Admit: 2012-06-08 | Discharge: 2012-06-09 | Disposition: A | Discharge status: Home / Self Care | Payer: Medicare Other | Attending: Emergency Medicine | Admitting: Emergency Medicine

## 2012-06-08 ENCOUNTER — Encounter (HOSPITAL_COMMUNITY): Payer: Self-pay

## 2012-06-08 DIAGNOSIS — R42 Dizziness and giddiness: Secondary | ICD-10-CM | POA: Insufficient documentation

## 2012-06-08 DIAGNOSIS — Z8739 Personal history of other diseases of the musculoskeletal system and connective tissue: Secondary | ICD-10-CM | POA: Insufficient documentation

## 2012-06-08 DIAGNOSIS — M542 Cervicalgia: Secondary | ICD-10-CM | POA: Insufficient documentation

## 2012-06-08 DIAGNOSIS — IMO0002 Reserved for concepts with insufficient information to code with codable children: Secondary | ICD-10-CM | POA: Insufficient documentation

## 2012-06-08 DIAGNOSIS — F17200 Nicotine dependence, unspecified, uncomplicated: Secondary | ICD-10-CM | POA: Insufficient documentation

## 2012-06-08 DIAGNOSIS — I10 Essential (primary) hypertension: Secondary | ICD-10-CM | POA: Insufficient documentation

## 2012-06-08 DIAGNOSIS — S0081XA Abrasion of other part of head, initial encounter: Secondary | ICD-10-CM

## 2012-06-08 DIAGNOSIS — Y92009 Unspecified place in unspecified non-institutional (private) residence as the place of occurrence of the external cause: Secondary | ICD-10-CM | POA: Insufficient documentation

## 2012-06-08 DIAGNOSIS — W19XXXA Unspecified fall, initial encounter: Secondary | ICD-10-CM | POA: Insufficient documentation

## 2012-06-08 DIAGNOSIS — K219 Gastro-esophageal reflux disease without esophagitis: Secondary | ICD-10-CM | POA: Insufficient documentation

## 2012-06-08 LAB — POCT I-STAT, CHEM 8
Chloride: 105 mEq/L (ref 96–112)
Creatinine, Ser: 1 mg/dL (ref 0.50–1.35)
Glucose, Bld: 108 mg/dL — ABNORMAL HIGH (ref 70–99)
Hemoglobin: 18.7 g/dL — ABNORMAL HIGH (ref 13.0–17.0)
Potassium: 3.7 mEq/L (ref 3.5–5.1)

## 2012-06-08 LAB — CBC WITH DIFFERENTIAL/PLATELET
Basophils Absolute: 0 10*3/uL (ref 0.0–0.1)
Basophils Relative: 0 % (ref 0–1)
HCT: 50.1 % (ref 39.0–52.0)
MCHC: 35.7 g/dL (ref 30.0–36.0)
Monocytes Absolute: 1 10*3/uL (ref 0.1–1.0)
Neutro Abs: 10.4 10*3/uL — ABNORMAL HIGH (ref 1.7–7.7)
Platelets: 150 10*3/uL (ref 150–400)
RDW: 13.3 % (ref 11.5–15.5)

## 2012-06-08 LAB — PROTIME-INR: Prothrombin Time: 13.3 seconds (ref 11.6–15.2)

## 2012-06-08 NOTE — ED Provider Notes (Signed)
History     CSN: [id number]  Arrival date & time 06/08/12  2201   First MD Initiated Contact with Patient 06/08/12 2213      Chief Complaint  Patient presents with  . Fall    syncope?    (Consider location/radiation/quality/duration/timing/severity/associated sxs/prior treatment) HPI Comments: Mr. Cortright presents from home for evaluation.  He is reported to have been outside, his brother discovered him on the ground with blood on his face.  He states he is unsure if he had fallen or if he had "passed-out" .  He states he is frequently off balance since having brain surgery but denies any recent worsening of this condition.  He denies alcohol or narcotics usage tonight.  He denies any other injuries other than his facial injuries.  The history is provided by the patient and the EMS personnel. The history is limited by the condition of the patient.    Past Medical History  Diagnosis Date  . Hypertension   . Shortness of breath     Hx of smoking.  Marland Kitchen GERD (gastroesophageal reflux disease)   . 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  . Arthritis     Left ankle  . Polycythemia     Past Surgical History  Procedure Date  . Ankle surgery 2003    Left  . Shoulder arthroscopy 2008    Left  . Brain surgery 2009    Biopsy  . Fracture surgery 2003    Left ankle  . Tympanostomy tube placement 1993  . Ankle arthroscopy 01/17/2012    Procedure: ANKLE ARTHROSCOPY;  Surgeon: Nadara Mustard, MD;  Location: [facility];  Service: Orthopedics;  Laterality: Left;    No family history on file.  History  Substance Use Topics  . Smoking status: Current Every Day Smoker -- 1.5 packs/day    Types: Cigarettes  . Smokeless tobacco: Not on file  . Alcohol Use: 7.2 oz/week    12 Cans of beer per week      Review of Systems  Constitutional: Negative for diaphoresis, activity change, appetite change and fatigue.  HENT: Positive for facial  swelling. Negative for ear pain, nosebleeds, sore throat, trouble swallowing, neck pain, neck stiffness, dental problem, voice change and tinnitus.   Eyes: Negative for pain and visual disturbance.  Respiratory: Negative for cough, chest tightness and shortness of breath.   Cardiovascular: Negative for chest pain, palpitations and leg swelling.  Gastrointestinal: Negative for nausea, vomiting, abdominal pain and diarrhea.  Genitourinary: Negative.   Musculoskeletal: Positive for back pain and arthralgias. Negative for myalgias and joint swelling.       Reports that he has a chronic issue with poor balance since having brain surgery  Skin: Positive for wound. Negative for color change, pallor and rash.  Neurological: Positive for syncope, light-headedness and headaches. Negative for dizziness, tremors, seizures, facial asymmetry, weakness and numbness.  Psychiatric/Behavioral: Positive for confusion.    Allergies  Review of patient's allergies indicates no known allergies.  Home Medications   Current Outpatient Rx  Name Route Sig Dispense Refill  . IBUPROFEN 800 MG PO TABS Oral Take 800 mg by mouth every 8 (eight) hours as needed. For pain    . ADULT MULTIVITAMIN W/MINERALS CH Oral Take 1 tablet by mouth daily.    . OXYCODONE-ACETAMINOPHEN 10-325 MG PO TABS Oral Take 1 tablet by mouth every 4 (four) hours as needed. For pain  BP 118/84  Pulse 102  Temp 98 F (36.7 C) (Oral)  Resp 19  SpO2 93%  Physical Exam  Nursing note and vitals reviewed. Constitutional: He is oriented to person, place, and time. He appears well-developed and well-nourished. He is active and cooperative.  Non-toxic appearance. He does not have a sickly appearance. He does not appear ill. No distress. He is not intubated.  HENT:  Head: Normocephalic. Not macrocephalic and not microcephalic. Head is with abrasion, with contusion and with laceration. Head is without raccoon's eyes, without Battle's sign,  without right periorbital erythema and without left periorbital erythema. No trismus in the jaw.    Right Ear: Tympanic membrane, external ear and ear canal normal. No tenderness. No mastoid tenderness. No hemotympanum.  Left Ear: External ear and ear canal normal. No mastoid tenderness. Tympanic membrane is scarred. No hemotympanum.  Nose: Nose lacerations present. No rhinorrhea, nasal deformity, septal deviation or nasal septal hematoma. No epistaxis.    Mouth/Throat: Oropharynx is clear and moist and mucous membranes are normal. Mucous membranes are not pale, not dry and not cyanotic. No uvula swelling. No oropharyngeal exudate.  Eyes: Conjunctivae normal, EOM and lids are normal. Pupils are equal, round, and reactive to light. Right eye exhibits no discharge and no exudate. No foreign body present in the right eye. Left eye exhibits no discharge and no exudate. No foreign body present in the left eye. Right conjunctiva is not injected. Right conjunctiva has no hemorrhage. Left conjunctiva is not injected. Left conjunctiva has no hemorrhage. No scleral icterus. Right eye exhibits normal extraocular motion and no nystagmus. Left eye exhibits normal extraocular motion and no nystagmus.  Neck: Trachea normal, normal range of motion, full passive range of motion without pain and phonation normal. Neck supple. Normal carotid pulses and no JVD present. No tracheal tenderness, no spinous process tenderness and no muscular tenderness present. Carotid bruit is not present. No rigidity. No tracheal deviation, no edema and normal range of motion present. No mass present.  Cardiovascular: Regular rhythm, S1 normal, S2 normal, normal heart sounds, intact distal pulses and normal pulses.   No extrasystoles are present. Tachycardia present.  PMI is not displaced.  Exam reveals no distant heart sounds and no friction rub.   No murmur heard. Pulmonary/Chest: Effort normal. No accessory muscle usage or stridor. No  apnea, not tachypneic and not bradypneic. He is not intubated. No respiratory distress. He has decreased breath sounds. He has no wheezes. He has no rhonchi. He has no rales. He exhibits no tenderness, no bony tenderness, no laceration and no crepitus.  Abdominal: Soft. Normal appearance and bowel sounds are normal. There is no hepatosplenomegaly, splenomegaly or hepatomegaly. There is tenderness. There is rebound. There is no rigidity, no guarding, no CVA tenderness, no tenderness at McBurney's point and negative Murphy's sign. No hernia.  Musculoskeletal: Normal range of motion. He exhibits no edema and no tenderness.       No deformities  Neurological: He is alert and oriented to person, place, and time. He has normal strength. He displays no atrophy and no tremor. No cranial nerve deficit or sensory deficit. He exhibits normal muscle tone. He displays no seizure activity. GCS eye subscore is 4. GCS verbal subscore is 5. GCS motor subscore is 6.       Pt has no recollection of the fall.  Skin: Skin is warm and dry. Abrasion and laceration noted. No burn, no petechiae and no rash noted. He is not diaphoretic. No erythema.  Psychiatric: He has a normal mood and affect. His behavior is normal. Judgment normal. His mood appears not anxious. His affect is not angry, not blunt, not labile and not inappropriate. His speech is not rapid and/or pressured, not delayed, not tangential and not slurred. He is not agitated, not aggressive, is not hyperactive, not slowed, not withdrawn, not actively hallucinating and not combative. Cognition and memory are impaired. He does not express impulsivity or inappropriate judgment. He does not exhibit a depressed mood. He is communicative. He is attentive.    ED Course  Procedures (including critical care time)  Labs Reviewed  GLUCOSE, CAPILLARY - Abnormal; Notable for the following:    Glucose-Capillary 127 (*)     All other components within normal limits  CBC WITH  DIFFERENTIAL  PROTIME-INR  ETHANOL  URINE RAPID DRUG SCREEN (HOSP PERFORMED)   No results found.   No diagnosis found.   Date: 06/08/2012  Rate: 109 bpm  Rhythm: sinus tachycardia  QRS Axis: normal  Intervals: normal  ST/T Wave abnormalities: normal  Conduction Disutrbances:none  Narrative Interpretation:   Old EKG Reviewed: only change is the rate      MDM  Pt presents s/p a fall.  He is unsure if it was a mechanical fall or if he had a syncopal event.  He has a reported hx of a brain tumor and remote hx alcohol abuse.  Note mild tachycardia, facial swelling, abrasions, and a small nasal laceration,  But no distress.  Pt does appear as if he could be intoxicated.  Plan CT head, neck, and orbits, basic labs, tox screen, blood alcohol, EKG, and chest xray.  1610.  Pt stable, NAD.  Reassessed wound on bridge of pt's nose.  Unable to repair laceration as there is a missing piece of tissue that has created a roughly 3-4 mm x 8 mm eliptically shaped defect surrounded by abrasion.  No acute abnormalities noted on imaging of C-spine, head, and chest.  Leukocytosis noted without fever or clinical evidence of infection/sepsis.  He is awake and oriented at his baseline.  Plan discharge home.  His mother and brother are at the bedside and all agree to the plan.      Tobin Chad, MD 06/09/12 [phone]

## 2012-06-08 NOTE — ED Notes (Signed)
Pt from home; hx brain tumor and brain surgery. Was found down by brother on walkway outside of house. ?Fall. Pt was alert at that time but doesn't remember what happened? Per brother, patient has poor short term memory d/t brain tumor. Hematoma to center forehead and small laceration to bridge of nose. No bleeding to nares. Denies any neck or back pain. Pt does endorse left shoulder pain but is s/p shoulder arthroscopy. Pt also endorses left ankle pain but is s/p ankle surgery and currently has a boot on that extremity.

## 2012-06-09 LAB — RAPID URINE DRUG SCREEN, HOSP PERFORMED
Amphetamines: NOT DETECTED
Barbiturates: NOT DETECTED
Benzodiazepines: NOT DETECTED

## 2012-06-09 MED ORDER — BACITRACIN ZINC 500 UNIT/GM EX OINT: TOPICAL_OINTMENT | Freq: Two times a day (BID) | CUTANEOUS | Status: DC

## 2012-06-09 NOTE — ED Notes (Signed)
MD at bedside. 

## 2012-11-09 ENCOUNTER — Ambulatory Visit
Admission: RE | Admit: 2012-11-09 | Discharge: 2012-11-09 | Disposition: A | Discharge status: Home / Self Care | Payer: Medicare Other | Source: Ambulatory Visit | Attending: Orthopedic Surgery | Admitting: Orthopedic Surgery

## 2012-11-09 ENCOUNTER — Other Ambulatory Visit: Payer: Self-pay | Admitting: Orthopedic Surgery

## 2012-11-09 DIAGNOSIS — M25572 Pain in left ankle and joints of left foot: Secondary | ICD-10-CM

## 2012-11-09 DIAGNOSIS — M25472 Effusion, left ankle: Secondary | ICD-10-CM

## 2012-11-21 HISTORY — PX: ANKLE FUSION: SHX881

## 2012-12-01 ENCOUNTER — Other Ambulatory Visit (HOSPITAL_COMMUNITY): Payer: Self-pay | Admitting: Orthopedic Surgery

## 2012-12-04 ENCOUNTER — Encounter (HOSPITAL_COMMUNITY)
Admission: RE | Admit: 2012-12-04 | Discharge: 2012-12-04 | Disposition: A | Discharge status: Home / Self Care | Payer: Medicare Other | Source: Ambulatory Visit | Attending: Orthopedic Surgery | Admitting: Orthopedic Surgery

## 2012-12-04 ENCOUNTER — Encounter (HOSPITAL_COMMUNITY): Payer: Self-pay

## 2012-12-04 HISTORY — DX: Anxiety disorder, unspecified: F41.9

## 2012-12-04 LAB — COMPREHENSIVE METABOLIC PANEL
ALT: 20 U/L (ref 0–53)
AST: 14 U/L (ref 0–37)
Albumin: 4.1 g/dL (ref 3.5–5.2)
Alkaline Phosphatase: 75 U/L (ref 39–117)
CO2: 23 mEq/L (ref 19–32)
Chloride: 102 mEq/L (ref 96–112)
Creatinine, Ser: 0.89 mg/dL (ref 0.50–1.35)
GFR calc non Af Amer: >90 mL/min (ref >90)
Potassium: 4.5 mEq/L (ref 3.5–5.1)
Total Bilirubin: 0.5 mg/dL (ref 0.3–1.2)

## 2012-12-04 LAB — APTT: aPTT: 30 seconds (ref 24–37)

## 2012-12-04 LAB — CBC
MCV: 91.7 fL (ref 78.0–100.0)
Platelets: 185 10*3/uL (ref 150–400)
RBC: 5.54 MIL/uL (ref 4.22–5.81)
RDW: 13.2 % (ref 11.5–15.5)
WBC: 12.1 10*3/uL — ABNORMAL HIGH (ref 4.0–10.5)

## 2012-12-04 LAB — PROTIME-INR: INR: 0.94 (ref 0.00–1.49)

## 2012-12-04 NOTE — Progress Notes (Signed)
Dr Tamsen Snider for any cardiac updates

## 2012-12-04 NOTE — Pre-Procedure Instructions (Signed)
Harold Bailey  12/04/2012   Your procedure is scheduled on:  12/08/12  Report to [facility] at 530 AM.  Call this number if you have problems the morning of surgery: [phone]   Remember:   Do not eat food or drink liquids after midnight.   Take these medicines the morning of surgery with A SIP OF WATER: percocet   Do not wear jewelry, make-up or nail polish.  Do not wear lotions, powders, or perfumes. You may wear deodorant.  Do not shave 48 hours prior to surgery. Men may shave face and neck.  Do not bring valuables to the hospital.  Contacts, dentures or bridgework may not be worn into surgery.  Leave suitcase in the car. After surgery it may be brought to your room.  For patients admitted to the hospital, checkout time is 11:00 AM the day of  discharge.   Patients discharged the day of surgery will not be allowed to drive  home.  Name and phone number of your driver: family  Special Instructions: Shower using CHG 2 nights before surgery and the night before surgery.  If you shower the day of surgery use CHG.  Use special wash - you have one bottle of CHG for all showers.  You should use approximately 1/3 of the bottle for each shower.   Please read over the following fact sheets that you were given: Pain Booklet, Coughing and Deep Breathing, MRSA Information and Surgical Site Infection Prevention

## 2012-12-07 ENCOUNTER — Encounter (HOSPITAL_COMMUNITY): Payer: Self-pay | Admitting: Pharmacy Technician

## 2012-12-07 MED ORDER — CEFAZOLIN SODIUM-DEXTROSE 2-3 GM-% IV SOLR
2.0000 g | INTRAVENOUS | Status: AC
  Administered 2012-12-08: 2 g via INTRAVENOUS
  Filled 2012-12-07: qty 50

## 2012-12-07 NOTE — Progress Notes (Signed)
0710 Monday  Will re-fax request for any cardiac notes from Dr. Kathryne Sharper office.....Marland KitchenDA

## 2012-12-08 ENCOUNTER — Encounter (HOSPITAL_COMMUNITY): Payer: Self-pay | Admitting: *Deleted

## 2012-12-08 ENCOUNTER — Inpatient Hospital Stay (HOSPITAL_COMMUNITY)
Admission: RE | Admit: 2012-12-08 | Discharge: 2012-12-11 | DRG: 494 | Disposition: A | Discharge status: Skilled Nursing Facility | Payer: Medicare Other | Source: Ambulatory Visit | Attending: Orthopedic Surgery | Admitting: Orthopedic Surgery

## 2012-12-08 ENCOUNTER — Encounter (HOSPITAL_COMMUNITY)
Admission: RE | Disposition: A | Discharge status: Skilled Nursing Facility | Payer: Self-pay | Source: Ambulatory Visit | Attending: Orthopedic Surgery

## 2012-12-08 ENCOUNTER — Encounter (HOSPITAL_COMMUNITY): Payer: Self-pay | Admitting: Anesthesiology

## 2012-12-08 ENCOUNTER — Inpatient Hospital Stay (HOSPITAL_COMMUNITY): Payer: Medicare Other | Admitting: Anesthesiology

## 2012-12-08 DIAGNOSIS — K219 Gastro-esophageal reflux disease without esophagitis: Secondary | ICD-10-CM | POA: Diagnosis present

## 2012-12-08 DIAGNOSIS — Z01812 Encounter for preprocedural laboratory examination: Secondary | ICD-10-CM

## 2012-12-08 DIAGNOSIS — Y831 Surgical operation with implant of artificial internal device as the cause of abnormal reaction of the patient, or of later complication, without mention of misadventure at the time of the procedure: Secondary | ICD-10-CM | POA: Diagnosis present

## 2012-12-08 DIAGNOSIS — T8489XA Other specified complication of internal orthopedic prosthetic devices, implants and grafts, initial encounter: Principal | ICD-10-CM | POA: Diagnosis present

## 2012-12-08 DIAGNOSIS — Z981 Arthrodesis status: Secondary | ICD-10-CM

## 2012-12-08 DIAGNOSIS — Z79899 Other long term (current) drug therapy: Secondary | ICD-10-CM

## 2012-12-08 DIAGNOSIS — F17200 Nicotine dependence, unspecified, uncomplicated: Secondary | ICD-10-CM | POA: Diagnosis present

## 2012-12-08 DIAGNOSIS — F411 Generalized anxiety disorder: Secondary | ICD-10-CM | POA: Diagnosis present

## 2012-12-08 DIAGNOSIS — I10 Essential (primary) hypertension: Secondary | ICD-10-CM | POA: Diagnosis present

## 2012-12-08 HISTORY — PX: HARDWARE REMOVAL: SHX979

## 2012-12-08 HISTORY — PX: ORIF ANKLE FRACTURE: SHX5408

## 2012-12-08 SURGERY — OPEN REDUCTION INTERNAL FIXATION (ORIF) ANKLE FRACTURE
Anesthesia: General | Site: Ankle | Laterality: Left | Wound class: Clean

## 2012-12-08 MED ORDER — HYDROMORPHONE HCL PF 1 MG/ML IJ SOLN
0.2500 mg | INTRAMUSCULAR | Status: DC | PRN
  Administered 2012-12-08: 0.5 mg via INTRAVENOUS
  Administered 2012-12-08: 1 mg via INTRAVENOUS
  Administered 2012-12-08: 0.5 mg via INTRAVENOUS

## 2012-12-08 MED ORDER — HYDROMORPHONE HCL PF 1 MG/ML IJ SOLN
0.5000 mg | INTRAMUSCULAR | Status: DC | PRN
  Administered 2012-12-08 – 2012-12-10 (×10): 1 mg via INTRAVENOUS
  Filled 2012-12-08 (×11): qty 1

## 2012-12-08 MED ORDER — SODIUM CHLORIDE 0.9 % IV SOLN: INTRAVENOUS | Status: DC

## 2012-12-08 MED ORDER — PHENYLEPHRINE HCL 10 MG/ML IJ SOLN
INTRAMUSCULAR | Status: DC | PRN
  Administered 2012-12-08 (×2): 80 ug via INTRAVENOUS

## 2012-12-08 MED ORDER — METOCLOPRAMIDE HCL 10 MG PO TABS
5.0000 mg | ORAL_TABLET | Freq: Three times a day (TID) | ORAL | Status: DC | PRN
  Filled 2012-12-08: qty 1

## 2012-12-08 MED ORDER — HYDROCODONE-ACETAMINOPHEN 5-325 MG PO TABS
1.0000 | ORAL_TABLET | ORAL | Status: DC | PRN
  Administered 2012-12-09 (×2): 1 via ORAL
  Administered 2012-12-11: 2 via ORAL
  Filled 2012-12-08: qty 2
  Filled 2012-12-08 (×2): qty 1

## 2012-12-08 MED ORDER — BISOPROLOL-HYDROCHLOROTHIAZIDE 10-6.25 MG PO TABS
1.0000 | ORAL_TABLET | Freq: Every day | ORAL | Status: DC
  Administered 2012-12-08 – 2012-12-11 (×4): 1 via ORAL
  Filled 2012-12-08 (×4): qty 1

## 2012-12-08 MED ORDER — DIVALPROEX SODIUM ER 500 MG PO TB24
500.0000 mg | ORAL_TABLET | Freq: Every day | ORAL | Status: DC
  Administered 2012-12-08 – 2012-12-11 (×4): 500 mg via ORAL
  Filled 2012-12-08 (×4): qty 1

## 2012-12-08 MED ORDER — LACTATED RINGERS IV SOLN
INTRAVENOUS | Status: DC | PRN
  Administered 2012-12-08 (×2): via INTRAVENOUS

## 2012-12-08 MED ORDER — MIDAZOLAM HCL 5 MG/5ML IJ SOLN
INTRAMUSCULAR | Status: DC | PRN
  Administered 2012-12-08: 2 mg via INTRAVENOUS

## 2012-12-08 MED ORDER — OXYCODONE-ACETAMINOPHEN 5-325 MG PO TABS
1.0000 | ORAL_TABLET | ORAL | Status: DC | PRN
  Administered 2012-12-08 – 2012-12-10 (×14): 2 via ORAL
  Filled 2012-12-08 (×13): qty 2

## 2012-12-08 MED ORDER — ONDANSETRON HCL 4 MG/2ML IJ SOLN: 4.0000 mg | Freq: Four times a day (QID) | INTRAMUSCULAR | Status: DC | PRN

## 2012-12-08 MED ORDER — SERTRALINE HCL 50 MG PO TABS
50.0000 mg | ORAL_TABLET | Freq: Every day | ORAL | Status: DC
  Administered 2012-12-08 – 2012-12-11 (×4): 50 mg via ORAL
  Filled 2012-12-08 (×4): qty 1

## 2012-12-08 MED ORDER — ONDANSETRON HCL 4 MG/2ML IJ SOLN
INTRAMUSCULAR | Status: DC | PRN
  Administered 2012-12-08: 4 mg via INTRAVENOUS

## 2012-12-08 MED ORDER — OXYCODONE HCL 5 MG PO TABS: 5.0000 mg | ORAL_TABLET | Freq: Once | ORAL | Status: DC | PRN

## 2012-12-08 MED ORDER — WARFARIN - PHARMACIST DOSING INPATIENT: Freq: Every day | Status: DC

## 2012-12-08 MED ORDER — HYDROMORPHONE HCL PF 1 MG/ML IJ SOLN
INTRAMUSCULAR | Status: DC | PRN
  Administered 2012-12-08: 1 mg via INTRAVENOUS

## 2012-12-08 MED ORDER — HYDROMORPHONE HCL PF 1 MG/ML IJ SOLN
INTRAMUSCULAR | Status: AC
  Filled 2012-12-08: qty 1

## 2012-12-08 MED ORDER — CELECOXIB 200 MG PO CAPS
ORAL_CAPSULE | ORAL | Status: AC
  Filled 2012-12-08: qty 1

## 2012-12-08 MED ORDER — OXYCODONE-ACETAMINOPHEN 5-325 MG PO TABS
ORAL_TABLET | ORAL | Status: AC
  Filled 2012-12-08: qty 2

## 2012-12-08 MED ORDER — EPHEDRINE SULFATE 50 MG/ML IJ SOLN
INTRAMUSCULAR | Status: DC | PRN
  Administered 2012-12-08 (×2): 10 mg via INTRAVENOUS
  Administered 2012-12-08: 5 mg via INTRAVENOUS

## 2012-12-08 MED ORDER — 0.9 % SODIUM CHLORIDE (POUR BTL) OPTIME
TOPICAL | Status: DC | PRN
  Administered 2012-12-08: 1000 mL

## 2012-12-08 MED ORDER — OXYCODONE HCL 5 MG/5ML PO SOLN: 5.0000 mg | Freq: Once | ORAL | Status: DC | PRN

## 2012-12-08 MED ORDER — WARFARIN SODIUM 10 MG PO TABS
10.0000 mg | ORAL_TABLET | Freq: Once | ORAL | Status: AC
  Administered 2012-12-08: 10 mg via ORAL
  Filled 2012-12-08: qty 1

## 2012-12-08 MED ORDER — METOCLOPRAMIDE HCL 5 MG/ML IJ SOLN: 5.0000 mg | Freq: Three times a day (TID) | INTRAMUSCULAR | Status: DC | PRN

## 2012-12-08 MED ORDER — WARFARIN VIDEO
Freq: Once | Status: AC
  Administered 2012-12-09: 10:00:00

## 2012-12-08 MED ORDER — CEFAZOLIN SODIUM 1-5 GM-% IV SOLN
1.0000 g | Freq: Four times a day (QID) | INTRAVENOUS | Status: AC
  Administered 2012-12-08 – 2012-12-09 (×3): 1 g via INTRAVENOUS
  Filled 2012-12-08 (×3): qty 50

## 2012-12-08 MED ORDER — TOPIRAMATE 15 MG PO CPSP: 15.0000 mg | ORAL_CAPSULE | Freq: Two times a day (BID) | ORAL | Status: DC

## 2012-12-08 MED ORDER — FENTANYL CITRATE 0.05 MG/ML IJ SOLN
INTRAMUSCULAR | Status: DC | PRN
  Administered 2012-12-08: 100 ug via INTRAVENOUS
  Administered 2012-12-08: 50 ug via INTRAVENOUS
  Administered 2012-12-08: 100 ug via INTRAVENOUS

## 2012-12-08 MED ORDER — MEPERIDINE HCL 25 MG/ML IJ SOLN: 6.2500 mg | INTRAMUSCULAR | Status: DC | PRN

## 2012-12-08 MED ORDER — PROPOFOL 10 MG/ML IV BOLUS
INTRAVENOUS | Status: DC | PRN
  Administered 2012-12-08: 200 mg via INTRAVENOUS

## 2012-12-08 MED ORDER — LIDOCAINE HCL (CARDIAC) 20 MG/ML IV SOLN
INTRAVENOUS | Status: DC | PRN
  Administered 2012-12-08: 100 mg via INTRAVENOUS

## 2012-12-08 MED ORDER — ALPRAZOLAM 0.5 MG PO TABS
1.0000 mg | ORAL_TABLET | Freq: Every evening | ORAL | Status: DC | PRN
  Administered 2012-12-08 – 2012-12-09 (×2): 1 mg via ORAL
  Filled 2012-12-08 (×4): qty 1

## 2012-12-08 MED ORDER — PROMETHAZINE HCL 25 MG/ML IJ SOLN: 6.2500 mg | INTRAMUSCULAR | Status: DC | PRN

## 2012-12-08 MED ORDER — CELECOXIB 200 MG PO CAPS
200.0000 mg | ORAL_CAPSULE | Freq: Every day | ORAL | Status: DC
  Administered 2012-12-08 – 2012-12-11 (×4): 200 mg via ORAL
  Filled 2012-12-08 (×3): qty 1

## 2012-12-08 MED ORDER — ONDANSETRON HCL 4 MG PO TABS: 4.0000 mg | ORAL_TABLET | Freq: Four times a day (QID) | ORAL | Status: DC | PRN

## 2012-12-08 MED ORDER — PATIENT'S GUIDE TO USING COUMADIN BOOK
Freq: Once | Status: AC
  Administered 2012-12-08: 15:00:00
  Filled 2012-12-08: qty 1

## 2012-12-08 SURGICAL SUPPLY — 61 items
BANDAGE ELASTIC 4 VELCRO ST LF (GAUZE/BANDAGES/DRESSINGS) IMPLANT
BANDAGE ELASTIC 6 VELCRO ST LF (GAUZE/BANDAGES/DRESSINGS) IMPLANT
BANDAGE ESMARK 6X9 LF (GAUZE/BANDAGES/DRESSINGS) IMPLANT
BANDAGE GAUZE ELAST BULKY 4 IN (GAUZE/BANDAGES/DRESSINGS) ×2 IMPLANT
BIT DRILL CANN LRG QC 5X300 (BIT) ×1 IMPLANT
BNDG CMPR 9X6 STRL LF SNTH (GAUZE/BANDAGES/DRESSINGS)
BNDG COHESIVE 4X5 TAN STRL (GAUZE/BANDAGES/DRESSINGS) ×2 IMPLANT
BNDG COHESIVE 6X5 TAN STRL LF (GAUZE/BANDAGES/DRESSINGS) ×1 IMPLANT
BNDG ESMARK 6X9 LF (GAUZE/BANDAGES/DRESSINGS)
BNDG GAUZE STRTCH 6 (GAUZE/BANDAGES/DRESSINGS) ×2 IMPLANT
CLOTH BEACON ORANGE TIMEOUT ST (SAFETY) ×2 IMPLANT
COVER SURGICAL LIGHT HANDLE (MISCELLANEOUS) ×2 IMPLANT
CUFF TOURNIQUET SINGLE 34IN LL (TOURNIQUET CUFF) IMPLANT
CUFF TOURNIQUET SINGLE 44IN (TOURNIQUET CUFF) IMPLANT
DRAPE C-ARM 42X72 X-RAY (DRAPES) IMPLANT
DRAPE C-ARM MINI 42X72 WSTRAPS (DRAPES) IMPLANT
DRAPE INCISE IOBAN 66X45 STRL (DRAPES) ×2 IMPLANT
DRAPE ORTHO SPLIT 77X108 STRL (DRAPES)
DRAPE PROXIMA HALF (DRAPES) ×2 IMPLANT
DRAPE SURG ORHT 6 SPLT 77X108 (DRAPES) IMPLANT
DRAPE U-SHAPE 47X51 STRL (DRAPES) ×2 IMPLANT
DRSG ADAPTIC 3X8 NADH LF (GAUZE/BANDAGES/DRESSINGS) ×2 IMPLANT
DRSG EMULSION OIL 3X3 NADH (GAUZE/BANDAGES/DRESSINGS) ×2 IMPLANT
DRSG PAD ABDOMINAL 8X10 ST (GAUZE/BANDAGES/DRESSINGS) ×2 IMPLANT
DURAPREP 26ML APPLICATOR (WOUND CARE) ×2 IMPLANT
ELECT REM PT RETURN 9FT ADLT (ELECTROSURGICAL) ×2
ELECTRODE REM PT RTRN 9FT ADLT (ELECTROSURGICAL) ×1 IMPLANT
GLOVE BIOGEL PI IND STRL 9 (GLOVE) ×1 IMPLANT
GLOVE BIOGEL PI INDICATOR 9 (GLOVE) ×1
GLOVE SURG ORTHO 9.0 STRL STRW (GLOVE) ×2 IMPLANT
GOWN PREVENTION PLUS XLARGE (GOWN DISPOSABLE) ×2 IMPLANT
GOWN SRG XL XLNG 56XLVL 4 (GOWN DISPOSABLE) ×2 IMPLANT
GOWN STRL NON-REIN XL XLG LVL4 (GOWN DISPOSABLE) ×4
GUIDEWIRE THREADED 2.8 (WIRE) ×2 IMPLANT
KIT BASIN OR (CUSTOM PROCEDURE TRAY) ×2 IMPLANT
KIT ROOM TURNOVER OR (KITS) ×2 IMPLANT
MANIFOLD NEPTUNE II (INSTRUMENTS) ×2 IMPLANT
NS IRRIG 1000ML POUR BTL (IV SOLUTION) ×2 IMPLANT
PACK ORTHO EXTREMITY (CUSTOM PROCEDURE TRAY) ×2 IMPLANT
PAD ARMBOARD 7.5X6 YLW CONV (MISCELLANEOUS) ×4 IMPLANT
PAD CAST 4YDX4 CTTN HI CHSV (CAST SUPPLIES) ×1 IMPLANT
PADDING CAST COTTON 4X4 STRL (CAST SUPPLIES) ×2
PADDING CAST COTTON 6X4 STRL (CAST SUPPLIES) ×2 IMPLANT
PUTTY DBM STAGRAFT 2CC (Putty) ×1 IMPLANT
SCREW CANN 16 THRD/55 7.3 (Screw) ×2 IMPLANT
SCREW CANN 32 THRD/75 7.3 (Screw) ×1 IMPLANT
SPONGE GAUZE 4X4 12PLY (GAUZE/BANDAGES/DRESSINGS) ×2 IMPLANT
SPONGE LAP 18X18 X RAY DECT (DISPOSABLE) ×2 IMPLANT
STAPLER VISISTAT 35W (STAPLE) IMPLANT
STOCKINETTE IMPERVIOUS 9X36 MD (GAUZE/BANDAGES/DRESSINGS) IMPLANT
SUCTION FRAZIER TIP 10 FR DISP (SUCTIONS) ×2 IMPLANT
SUT ETHILON 2 0 PSLX (SUTURE) IMPLANT
SUT VIC AB 0 CT1 27 (SUTURE)
SUT VIC AB 0 CT1 27XBRD ANBCTR (SUTURE) IMPLANT
SUT VIC AB 2-0 CT1 27 (SUTURE)
SUT VIC AB 2-0 CT1 TAPERPNT 27 (SUTURE) IMPLANT
SUT VIC AB 2-0 CTB1 (SUTURE) ×4 IMPLANT
TOWEL OR 17X24 6PK STRL BLUE (TOWEL DISPOSABLE) ×2 IMPLANT
TOWEL OR 17X26 10 PK STRL BLUE (TOWEL DISPOSABLE) ×2 IMPLANT
TUBE CONNECTING 12X1/4 (SUCTIONS) ×2 IMPLANT
WATER STERILE IRR 1000ML POUR (IV SOLUTION) ×2 IMPLANT

## 2012-12-08 NOTE — Preoperative (Signed)
Beta Blockers   Reason not to administer Beta Blockers:Not Applicable 

## 2012-12-08 NOTE — Progress Notes (Signed)
ANTICOAGULATION CONSULT NOTE - Initial Consult  Pharmacy Consult:  Coumadin Indication:  VTE prophylaxis s/p ORIF ankle fracture  No Known Allergies  Patient Measurements: Height: 6' 0.05" (183 cm) Weight: 214 lb 11.7 oz (97.4 kg) IBW/kg (Calculated) : 77.71  Vital Signs: Temp: 98.3 F (36.8 C) (03/18 0942) BP: 113/81 mmHg (03/18 0942) Pulse Rate: 80 (03/18 0942)  Labs: No results found for this basename: HGB, HCT, PLT, APTT, LABPROT, INR, HEPARINUNFRC, CREATININE, CKTOTAL, CKMB, TROPONINI,  in the last 72 hours  Estimated Creatinine Clearance: 122.9 ml/min (by C-G formula based on Cr of 0.89).   Medical History: Past Medical History  Diagnosis Date  . Hypertension   . Shortness of breath     Hx of smoking.  Marland Kitchen GERD (gastroesophageal reflux disease)   . 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  . Arthritis     Left ankle  . Polycythemia   . Anxiety        Assessment: 47 YOM admitted with left ankle pain, now s/p ORIF ankle fracture.  Patient to start Coumadin for VTE prophylaxis.  Baseline INR at 0.94.   Goal of Therapy:  INR 2-3 Monitor platelets by anticoagulation protocol: Yes     Plan:  - Coumadin 10mg  PO today - Daily PT / INR - Coumadin book / video     Redmond Whittley D. Laney Potash, PharmD, BCPS Pager:  [phone] 12/08/2012, 9:59 AM

## 2012-12-08 NOTE — Op Note (Signed)
OPERATIVE REPORT  DATE OF SURGERY: 12/08/2012  PATIENT:  Harold Bailey,  47 y.o. male  PRE-OPERATIVE DIAGNOSIS:  Left Ankle Fibrous Union  POST-OPERATIVE DIAGNOSIS:  Left Ankle Fibrous Union  PROCEDURE:  Procedure(s): OPEN REDUCTION INTERNAL FIXATION (ORIF) ANKLE FRACTURE left HARDWARE REMOVAL deep hardware left ankle separate incisions  SURGEON:  Surgeon(s): Nadara Mustard, MD  ANESTHESIA:   general  EBL:  Minimal ML  SPECIMEN:  No Specimen  TOURNIQUET:   Total Tourniquet Time Documented: Calf (Left) - 29 minutes Total: Calf (Left) - 29 minutes   PROCEDURE DETAILS: Patient is a 47 year old gentleman who is status post ankle fusion. Patient progressed to a fibrous union which was painful despite conservative therapy with casting immobilization and nonweightbearing he presents at this time for revision fusion. Risks and benefits of surgery were discussed including infection neurovascular injury nonhealing of the fusion need for additional surgery potential for amputation. The importance of smoking cessation was again discussed. Patient states he understands and wished to proceed at this time. Description of procedure patient was brought to the operating room and underwent a general anesthetic. After adequate levels of anesthesia were obtained patient's left lower extremity was prepped using DuraPrep and draped into a sterile field. The medial and lateral incisions were made proximal to the ankle and the deep retained hardware was removed. Separate incision was then made laterally to facilitate the fusion. This was carried down to the fibula. The distal aspect of the fibula was resected. Using a oscillating saw the tibial talar joint was taken down perpendicular to the long axis of the tibia. This was debrided back to bleeding viable cancellus bone. The ankle was placed in 90 of dorsiflexion and 2 crossing pins were placed from the lateral incision and a new medial incision was made  to place a third cross locking screw. C-arm fluoroscopy verified reduction. The wounds were closed with 2-0 nylon. The bone graft and demineralized bone matrix and cancellus chips was also placed through the lateral incision with autograft bone graft. The incisions were closed with 2-0 nylon. The wounds were covered Adaptic orthopedic sponges AB dressing Kerlix and Coban.  PLAN OF CARE: Admit to inpatient   PATIENT DISPOSITION:  PACU - hemodynamically stable.   Nadara Mustard, MD 12/08/2012 9:03 AM

## 2012-12-08 NOTE — Anesthesia Preprocedure Evaluation (Addendum)
Anesthesia Evaluation  Patient identified by MRN, date of birth, ID band Patient awake    Reviewed: Allergy & Precautions, H&P , NPO status , Patient's Chart, lab work & pertinent test results  Airway Mallampati: II      Dental  (+) Edentulous Upper and Edentulous Lower   Pulmonary shortness of breath, Current Smoker,  breath sounds clear to auscultation        Cardiovascular hypertension, Rhythm:Regular Rate:Normal     Neuro/Psych    GI/Hepatic GERD-  Controlled,  Endo/Other    Renal/GU      Musculoskeletal   Abdominal   Peds  Hematology   Anesthesia Other Findings   Reproductive/Obstetrics                         Anesthesia Physical Anesthesia Plan  ASA: II  Anesthesia Plan: General   Post-op Pain Management:    Induction: Intravenous  Airway Management Planned: LMA  Additional Equipment:   Intra-op Plan:   Post-operative Plan: Extubation in OR  Informed Consent:   Dental advisory given  Plan Discussed with: CRNA and Surgeon  Anesthesia Plan Comments:         Anesthesia Quick Evaluation

## 2012-12-08 NOTE — Anesthesia Procedure Notes (Signed)
Procedure Name: LMA Insertion Date/Time: 12/08/2012 7:34 AM Performed by: Arlice Colt B Pre-anesthesia Checklist: Patient identified, Emergency Drugs available, Suction available, Patient being monitored and Timeout performed Patient Re-evaluated:Patient Re-evaluated prior to inductionOxygen Delivery Method: Circle system utilized Preoxygenation: Pre-oxygenation with 100% oxygen Intubation Type: IV induction LMA: LMA inserted LMA Size: 5.0 Number of attempts: 1 Placement Confirmation: positive ETCO2 and breath sounds checked- equal and bilateral Tube secured with: Tape Dental Injury: Teeth and Oropharynx as per pre-operative assessment

## 2012-12-08 NOTE — Anesthesia Postprocedure Evaluation (Signed)
  Anesthesia Post-op Note  Patient: Harold Bailey  Procedure(s) Performed: Procedure(s) with comments: OPEN REDUCTION INTERNAL FIXATION (ORIF) ANKLE FRACTURE (Left) - Removal Deep Hardware, Take Down Non-Union, Revision Internal Fixation Left Ankle HARDWARE REMOVAL (Left) - Removal Deep Hardware, Take Down Non-Union, Revision Internal Fixation Left Ankle  Patient Location: PACU  Anesthesia Type:General  Level of Consciousness: awake and alert   Airway and Oxygen Therapy: Patient Spontanous Breathing  Post-op Pain: mild  Post-op Assessment: Post-op Vital signs reviewed  Post-op Vital Signs: stable  Complications: No apparent anesthesia complications

## 2012-12-08 NOTE — Progress Notes (Signed)
Orthopedic Tech Progress Note Patient Details:  Harold Bailey Feb 12, 1966 [id number]  Patient ID: Harold Bailey, male   DOB: Feb 16, 1966, 47 y.o.   MRN: [id number]   Harold Bailey 12/08/2012, 11:12 AM PATIENT REFUSED POST OP SHOE.

## 2012-12-08 NOTE — H&P (Signed)
Harold Bailey is an 47 y.o. male.   Chief Complaint: Left ankle pain HPI: Patient is a 47 year old gentleman who is status post left ankle fusion for traumatic arthritis left ankle. Patient has progressed to a fibrous nonunion has pain with activities daily living and presents at this time for revision fusion.  Past Medical History  Diagnosis Date  . Hypertension   . Shortness of breath     Hx of smoking.  Marland Kitchen GERD (gastroesophageal reflux disease)   . 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  . Arthritis     Left ankle  . Polycythemia   . Anxiety     Past Surgical History  Procedure Laterality Date  . Ankle surgery  2003    Left  . Shoulder arthroscopy  2008    Left  . Brain surgery  2009    Biopsy  . Fracture surgery  2003    Left ankle  . Tympanostomy tube placement  1993  . Ankle arthroscopy  01/17/2012    Procedure: ANKLE ARTHROSCOPY;  Surgeon: Nadara Mustard, MD;  Location: [facility] OR;  Service: Orthopedics;  Laterality: Left;  . Inner ear surgery      History reviewed. No pertinent family history. Social History:  reports that he has been smoking Cigarettes.  He has a 37.5 pack-year smoking history. He does not have any smokeless tobacco history on file. He reports that he drinks about 7.2 ounces of alcohol per week. He reports that he does not use illicit drugs.  Allergies: No Known Allergies  Medications Prior to Admission  Medication Sig Dispense Refill  . ALFALFA PO Take 1 tablet by mouth daily.      Marland Kitchen ALPRAZolam (XANAX) 1 MG tablet Take 1 mg by mouth at bedtime as needed for sleep.      . bisoprolol-hydrochlorothiazide (ZIAC) 10-6.25 MG per tablet Take 1 tablet by mouth daily.      . celecoxib (CELEBREX) 200 MG capsule Take 200 mg by mouth daily.      . divalproex (DEPAKOTE ER) 500 MG 24 hr tablet Take 500 mg by mouth daily.      . fish oil-omega-3 fatty acids 1000 MG capsule Take 2 g by mouth daily.      Marland Kitchen  ibuprofen (ADVIL,MOTRIN) 800 MG tablet Take 800 mg by mouth every 8 (eight) hours as needed. For pain      . Multiple Vitamin (MULTIVITAMIN WITH MINERALS) TABS Take 1 tablet by mouth daily.      . nabumetone (RELAFEN) 750 MG tablet Take 750 mg by mouth 2 (two) times daily as needed for pain.      Marland Kitchen oxyCODONE-acetaminophen (PERCOCET/ROXICET) 5-325 MG per tablet Take 1 tablet by mouth every 4 (four) hours as needed for pain.      Marland Kitchen sertraline (ZOLOFT) 50 MG tablet Take 50 mg by mouth daily.      Marland Kitchen topiramate (TOPAMAX) 15 MG capsule Take 15 mg by mouth 2 (two) times daily.        No results found for this or any previous visit (from the past 48 hour(s)). No results found.  Review of Systems  All other systems reviewed and are negative.    Blood pressure 98/67, pulse 70, temperature 97.6 F (36.4 C), resp. rate 20, SpO2 93.00%. Physical Exam  On examination patient has palpable pulses. He is tender with distraction across the ankle joint. CT scan shows a fibrous nonunion. Assessment/Plan  Assessment: Fibrous nonunion left ankle fusion.  Plan: Will plan for removal of deep retained hardware revision open fusion. Risks and benefits were discussed including infection neurovascular injury pain need for additional surgery. Patient states he understands and wished to proceed at this time.  Arvie Villarruel V 12/08/2012, 6:40 AM

## 2012-12-08 NOTE — Transfer of Care (Signed)
Immediate Anesthesia Transfer of Care Note  Patient: Harold Bailey  Procedure(s) Performed: Procedure(s) with comments: OPEN REDUCTION INTERNAL FIXATION (ORIF) ANKLE FRACTURE (Left) - Removal Deep Hardware, Take Down Non-Union, Revision Internal Fixation Left Ankle HARDWARE REMOVAL (Left) - Removal Deep Hardware, Take Down Non-Union, Revision Internal Fixation Left Ankle  Patient Location: PACU  Anesthesia Type:General  Level of Consciousness: awake, alert  and oriented  Airway & Oxygen Therapy: Patient Spontanous Breathing and Patient connected to nasal cannula oxygen  Post-op Assessment: Report given to PACU RN and Post -op Vital signs reviewed and stable  Post vital signs: Reviewed and stable  Complications: No apparent anesthesia complications

## 2012-12-09 LAB — PROTIME-INR: Prothrombin Time: 13.5 seconds (ref 11.6–15.2)

## 2012-12-09 MED ORDER — WARFARIN SODIUM 10 MG PO TABS
10.0000 mg | ORAL_TABLET | Freq: Once | ORAL | Status: AC
  Administered 2012-12-09: 10 mg via ORAL
  Filled 2012-12-09: qty 1

## 2012-12-09 NOTE — Progress Notes (Signed)
Patient informed about fall precautions, calling when he needs to get up, or go to the bathroom. Bed alarm placed on to alert the nursing staff when the patient gets out of bed.  Patient states "I understand how to move, I have been doing this for 17 years."   Patient is at risk for falling because he wants to move on his own instead of allowing nursing staff to help him

## 2012-12-09 NOTE — Progress Notes (Signed)
Patient ID: Harold Bailey, male   DOB: [birth year], 47 y.o.   MRN: [id number] Postoperative day one revision fusion for a fibrous nonunion left ankle. Patient is comfortable this morning. Dressing is clean and dry. Patient requests discharge to short-term skilled nursing. F. L2 is signed.

## 2012-12-09 NOTE — Evaluation (Signed)
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  Pt lives alone and has fall several times since his last surgical intervention on the Left Ankle. 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  Pt would benefit from 24/7 supervision secondary to high risk of fall and further injury to Left Ankle.  Pt will benefit from short term SNF placement until able to place weight on LLE for 6-8 weeks.      PT Assessment  Patient needs continued PT services    Follow Up Recommendations  SNF;Supervision/Assistance - 24 hour    Does the patient have the potential to tolerate intense rehabilitation      Barriers to Discharge Decreased caregiver support;Inaccessible home environment Pt has multiple stairs and no caregiver.      Equipment Recommendations  Wheelchair (measurements PT)    Recommendations for Other Services     Frequency Min 2X/week    Precautions / Restrictions Precautions Precautions: Fall Restrictions Weight Bearing Restrictions: Yes LLE Weight Bearing: Non weight bearing   Pertinent Vitals/Pain No c/o pain      Mobility  Bed Mobility Bed Mobility: Supine to Sit;Sit to Supine Supine to Sit: 7: Independent Sit to Supine: 7: Independent Transfers Transfers: Sit to Stand;Stand to [employer] Transfers Sit to Stand: 5: Supervision Stand to Sit: 5: Supervision Stand Pivot Transfers: 5: Supervision Details for Transfer Assistance: Supervision secondary to pt being impulsive and demonstrating poor safety awareness Ambulation/Gait Ambulation/Gait Assistance: 4: Min guard Ambulation Distance (Feet): 15 Feet Assistive device:  Rolling walker Ambulation/Gait Assistance Details: close supervision secondary to impulsivity and unsafe movements. Pt lost balance x 2 while trying to demonstrate why the walker is not safe for him.  Gait Pattern: Step-to pattern General Gait Details: Pt able to maintain NWB in LLE>  Stairs: No Wheelchair Mobility Wheelchair Mobility: No    Exercises     PT Diagnosis: Difficulty walking  PT Problem List: Decreased safety awareness PT Treatment Interventions: Gait training;Stair training;Patient/family education   PT Goals Acute Rehab PT Goals PT Goal Formulation: With patient Time For Goal Achievement: 12/19/12 Potential to Achieve Goals: Fair Pt will Transfer Bed to Chair/Chair to Bed: with modified independence PT Transfer Goal: Bed to Chair/Chair to Bed - Progress: Goal set today Pt will Ambulate: 16 - 50 feet;with modified independence;with rolling walker PT Goal: Ambulate - Progress: Goal set today Pt will Go Up / Down Stairs: Flight;with modified independence;with least restrictive assistive device PT Goal: Up/Down Stairs - Progress: Goal set today  Visit Information  Last PT Received On: 12/09/12 Assistance Needed: +1    Subjective Data  Subjective: I have fallen 8 times.  I have equillibrium problems.     Prior Functioning  Home Living Lives With: Alone Type of Home: Apartment Home Access: Stairs to enter Entrance Stairs-Rails: Right Home Adaptive Equipment: Walker - rolling Prior Function Level of Independence: Independent with assistive device(s) Able to Take Stairs?: Yes Driving: Yes Vocation: Unemployed Communication Communication: No difficulties    Cognition  Cognition Overall Cognitive Status: History of cognitive impairments - at baseline Arousal/Alertness: Awake/alert Orientation Level: Oriented X4 / Intact Behavior During Session: Anxious    Extremity/Trunk Assessment Right Upper Extremity Assessment  RUE ROM/Strength/Tone: Within functional  levels Right Lower Extremity Assessment RLE ROM/Strength/Tone: Within functional levels Left Lower Extremity Assessment LLE ROM/Strength/Tone: Unable to fully assess;Due to pain;Due to precautions   Balance    End of Session PT - End of Session Equipment Utilized During Treatment: Gait belt Activity Tolerance: Patient tolerated treatment well Patient left: in bed;with call bell/phone within reach;with bed alarm set Nurse Communication: Mobility status  GP     Kenadi Miltner 12/09/2012, 2:18 PM Marialuiza Car L. Zela Sobieski DPT [phone]

## 2012-12-09 NOTE — Clinical Social Work Placement (Addendum)
Clinical Social Work Department  CLINICAL SOCIAL WORK PLACEMENT NOTE  12/09/2012  Patient: Harold Bailey  Account Number: [id number] Admit date: 12/08/12 Clinical Social Worker: Sabino Niemann MSW  Date/time: 12/09/2012 11:30 AM  Clinical Social Work is seeking post-discharge placement for this patient at the following level of care: SKILLED NURSING (*CSW will update this form in Epic as items are completed)  12/09/2012 Patient/family provided with [facility] Department of Clinical Social Work's list of facilities offering this level of care within the geographic area requested by the patient (or if unable, by the patient's family).  12/09/2012 Patient/family informed of their freedom to choose among providers that offer the needed level of care, that participate in Medicare, Medicaid or managed care program needed by the patient, have an available bed and are willing to accept the patient.  12/09/2012 Patient/family informed of [facility]' ownership interest in [facility], as well as of the fact that they are under no obligation to receive care at this facility.  PASARR submitted to EDS on pre-existing  PASARR number received from EDS on pre-existing FL2 transmitted to all facilities in geographic area requested by pt/family on 12/09/2012  FL2 transmitted to all facilities within larger geographic area on  Patient informed that his/her managed care company has contracts with or will negotiate with certain facilities, including the following:  Patient/family informed of bed offers received: 12/10/12 Patient chooses bed at [facility] Physician recommends and patient chooses bed at  Patient to be transferred to on 12/11/2012 Patient to be transferred to facility by PTAR  The following physician request were entered in Epic:  Additional Comments:  Sabino Niemann, MSW, [phone]  Signing off

## 2012-12-09 NOTE — Clinical Social Work Placement (Signed)
Clinical Social Work Department  BRIEF PSYCHOSOCIAL ASSESSMENT  Patient: Harold Bailey Account Number: [id number]  Admit date: 12/08/12 Clinical Social Worker Sabino Niemann, MSW Date/Time: 12/09/2012 12:00 PM Referred by: Physician Date Referred: 12/09/2012 Referred for   SNF Placement   Other Referral:  Interview type: Patient  Other interview type: PSYCHOSOCIAL DATA  Living Status: spouse Admitted from facility:  Level of care:  Primary support name: Minardi,Vickie Primary support relationship to patient: Spouse Degree of support available:  Strong and vested  CURRENT CONCERNS  Current Concerns   Post-Acute Placement   Other Concerns:  SOCIAL WORK ASSESSMENT / PLAN  CSW met with pt re: PT recommendation for SNF.   Pt lives with spouse  CSW explained placement process and answered questions.   Pt reports that he went to [facility] in the past but has no preference at this time    CSW completed FL2 and initiated SNF search.     Assessment/plan status: Information/Referral to [facility]  Other assessment/ plan:  Information/referral to community resources:  SNF   PTAR   PATIENT'S/FAMILY'S RESPONSE TO PLAN OF CARE:  Pt reports he is agreeable to ST SNF in order to increase strength and independence with mobility prior to return home  Pt verbalized understanding of placement process and appreciation for CSW assist.   Sabino Niemann, MSW [phone]

## 2012-12-09 NOTE — Progress Notes (Signed)
ANTICOAGULATION CONSULT NOTE - FOLLOW-UP  Pharmacy Consult:  Coumadin Indication:  VTE prophylaxis s/p ORIF ankle fracture  No Known Allergies  Patient Measurements: Height: 6' 0.05" (183 cm) Weight: 214 lb 11.7 oz (97.4 kg) IBW/kg (Calculated) : 77.71  Vital Signs: Temp: 97.2 F (36.2 C) (03/19 0547) BP: 100/65 mmHg (03/19 0547) Pulse Rate: 81 (03/19 0547)  Labs:  Recent Labs  12/09/12 0440  LABPROT 13.5  INR 1.04    Estimated Creatinine Clearance: 122.9 ml/min (by C-G formula based on Cr of 0.89).      Assessment: 47 YOM admitted with left ankle pain, now s/p ORIF ankle fracture.  Patient started Coumadin for VTE prophylaxis.  INR sub-therapeutic as expected; no bleeding reported.    Goal of Therapy:  INR 2-3 Monitor platelets by anticoagulation protocol: Yes    Plan:  - Repeat Coumadin 10mg  PO today - Daily PT / INR     Theodore Rahrig D. Laney Potash, PharmD, BCPS Pager:  [phone] 12/09/2012, 11:28 AM

## 2012-12-10 ENCOUNTER — Encounter (HOSPITAL_COMMUNITY): Payer: Self-pay | Admitting: General Practice

## 2012-12-10 LAB — PROTIME-INR: INR: 1.3 (ref 0.00–1.49)

## 2012-12-10 MED ORDER — OXYCODONE-ACETAMINOPHEN 5-325 MG PO TABS: 1.0000 | ORAL_TABLET | ORAL | Status: DC | PRN

## 2012-12-10 MED ORDER — WARFARIN SODIUM 7.5 MG PO TABS
7.5000 mg | ORAL_TABLET | Freq: Once | ORAL | Status: AC
  Administered 2012-12-10: 7.5 mg via ORAL
  Filled 2012-12-10: qty 1

## 2012-12-10 NOTE — Progress Notes (Signed)
Patient ID: Harold Bailey, male   DOB: 09-Jul-1966, 47 y.o.   MRN: [id number] Patient is stable for discharge to skilled nursing at this time. Discharge summary completed.

## 2012-12-10 NOTE — Discharge Summary (Signed)
Physician Discharge Summary  Patient ID: Harold Bailey MRN: [id number] DOB/AGE: 04-09-1966 47 y.o.  Admit date: 12/08/2012 Discharge date: 12/10/2012  Admission Diagnoses: Nonunion left ankle fusion  Discharge Diagnoses: Nonunion left ankle fusion Active Problems:   * No active hospital problems. *   Discharged Condition: stable  Hospital Course: Patient's hospital course was essentially unremarkable. He underwent removal of deep retained hardware takedown of the fibrous nonunion and revision fusion of the ankle on the left. Postoperatively patient was unstable with therapy and required extended physical therapy and is discharged to short-term skilled nursing.  Consults: None  Significant Diagnostic Studies: labs: Routine labs  Treatments: surgery: See operative note  Discharge Exam: Blood pressure 111/61, pulse 64, temperature 97.2 F (36.2 C), resp. rate 18, height 6' 0.05" (1.83 m), weight 97.4 kg (214 lb 11.7 oz), SpO2 95.00%. Incision/Wound: dressing clean dry and intact  Disposition: 01-Home or Self Care     Medication List    ASK your doctor about these medications       ALFALFA PO  Take 1 tablet by mouth daily.     ALPRAZolam 1 MG tablet  Commonly known as:  XANAX  Take 1 mg by mouth at bedtime as needed for sleep.     bisoprolol-hydrochlorothiazide 10-6.25 MG per tablet  Commonly known as:  ZIAC  Take 1 tablet by mouth daily.     celecoxib 200 MG capsule  Commonly known as:  CELEBREX  Take 200 mg by mouth daily.     divalproex 500 MG 24 hr tablet  Commonly known as:  DEPAKOTE ER  Take 500 mg by mouth daily.     fish oil-omega-3 fatty acids 1000 MG capsule  Take 2 g by mouth daily.     ibuprofen 800 MG tablet  Commonly known as:  ADVIL,MOTRIN  Take 800 mg by mouth every 8 (eight) hours as needed. For pain     multivitamin with minerals Tabs  Take 1 tablet by mouth daily.     nabumetone 750 MG tablet  Commonly known as:  RELAFEN  Take 750  mg by mouth 2 (two) times daily as needed for pain.     oxyCODONE-acetaminophen 5-325 MG per tablet  Commonly known as:  PERCOCET/ROXICET  Take 1 tablet by mouth every 4 (four) hours as needed for pain.     sertraline 50 MG tablet  Commonly known as:  ZOLOFT  Take 50 mg by mouth daily.     topiramate 15 MG capsule  Commonly known as:  TOPAMAX  Take 15 mg by mouth 2 (two) times daily.           Follow-up Information   Follow up with Amarea Macdowell V, MD In 2 weeks.   Contact information:   [address] [place] [address] [phone]       Signed: Nadara Mustard 12/10/2012, 6:47 AM

## 2012-12-10 NOTE — Progress Notes (Signed)
Pt continues to get out of bed without assist. Amy NT and I have discussed the dangers of getting up alone and falling. Pt insists that he will not fall and refuses for [place] to set the bed alarm after multiple attempts of explaining the importance of calling for assistance. Discussed with Joni Reining, [employer] administrator. Will continue to monitor pt.

## 2012-12-10 NOTE — Progress Notes (Signed)
ANTICOAGULATION CONSULT NOTE - FOLLOW-UP  Pharmacy Consult:  Coumadin Indication:  VTE prophylaxis s/p ORIF ankle fracture  No Known Allergies  Patient Measurements: Height: 6' 0.05" (183 cm) Weight: 214 lb 11.7 oz (97.4 kg) IBW/kg (Calculated) : 77.71  Vital Signs: Temp: 97.2 F (36.2 C) (03/20 0548) BP: 111/61 mmHg (03/20 0548) Pulse Rate: 64 (03/20 0548)  Labs:  Recent Labs  12/09/12 0440 12/10/12 0555  LABPROT 13.5 15.9*  INR 1.04 1.30    Estimated Creatinine Clearance: 122.9 ml/min (by C-G formula based on Cr of 0.89).      Assessment: 47 YOM admitted with left ankle pain, now s/p ORIF ankle fracture.  Patient continues on Coumadin for VTE prophylaxis.  INR sub-therapeutic; no bleeding reported.  Noted plan for discharge.   Goal of Therapy:  INR 2-3 Monitor platelets by anticoagulation protocol: Yes    Plan:  - Coumadin 7.5mg  PO today if still here - Daily PT / INR     Ceasar Decandia D. Laney Potash, PharmD, BCPS Pager:  [phone] 12/10/2012, 1:04 PM

## 2012-12-11 MED ORDER — OXYCODONE-ACETAMINOPHEN 5-325 MG PO TABS: 1.0000 | ORAL_TABLET | ORAL | Status: DC | PRN

## 2012-12-11 MED ORDER — OXYCODONE-ACETAMINOPHEN 5-325 MG PO TABS
1.0000 | ORAL_TABLET | ORAL | Status: DC | PRN
Start: 2012-12-11 — End: 2013-01-01

## 2012-12-11 NOTE — Progress Notes (Signed)
Physical Therapy Treatment Patient Details Name: Harold Bailey MRN: [id number] DOB: [birth year] Today's Date: 12/11/2012 Time: 4098-1191 PT Time Calculation (min): 15 min  PT Assessment / Plan / Recommendation Comments on Treatment Session       Follow Up Recommendations  SNF;Supervision/Assistance - 24 hour     Does the patient have the potential to tolerate intense rehabilitation     Barriers to Discharge        Equipment Recommendations       Recommendations for Other Services    Frequency Min 2X/week   Plan Discharge plan remains appropriate;Frequency remains appropriate    Precautions / Restrictions Precautions Precautions: Fall Restrictions Weight Bearing Restrictions: Yes LLE Weight Bearing: Non weight bearing   Pertinent Vitals/Pain 6/10 pain in LE    Mobility  Bed Mobility Bed Mobility: Not assessed Transfers Transfers: Sit to Stand;Stand to Sit;Stand Pivot Transfers Sit to Stand: 5: Supervision Stand to Sit: 5: Supervision Stand Pivot Transfers: 5: Supervision Details for Transfer Assistance: Supervision secondary to pt being impulsive and demonstrating poor safety awareness Ambulation/Gait Ambulation/Gait Assistance: 4: Min guard Ambulation Distance (Feet): 20 Feet Assistive device: Rolling walker Ambulation/Gait Assistance Details: continues to require close supervision for safety and reminders to maintain NWB.   Gait Pattern: Step-to pattern    Exercises     PT Diagnosis:    PT Problem List:   PT Treatment Interventions:     PT Goals Acute Rehab PT Goals PT Goal Formulation: With patient Time For Goal Achievement: 12/19/12 Potential to Achieve Goals: Fair Pt will Transfer Bed to Chair/Chair to Bed: with modified independence PT Transfer Goal: Bed to Chair/Chair to Bed - Progress: Progressing toward goal Pt will Ambulate: 16 - 50 feet;with modified independence;with rolling walker PT Goal: Ambulate - Progress: Progressing toward goal Pt  will Go Up / Down Stairs: Flight;with modified independence;with least restrictive assistive device PT Goal: Up/Down Stairs - Progress: Not met  Visit Information  Last PT Received On: 12/11/12 Assistance Needed: +1    Subjective Data  Subjective: I am leaving today   Cognition  Cognition Overall Cognitive Status: History of cognitive impairments - at baseline Arousal/Alertness: Awake/alert Orientation Level: Oriented X4 / Intact Behavior During Session: Anxious    Balance     End of Session PT - End of Session Equipment Utilized During Treatment: Gait belt Activity Tolerance: Patient tolerated treatment well Patient left: in bed;with call bell/phone within reach;with bed alarm set Nurse Communication: Mobility status   GP     Harold Bailey 12/11/2012, 5:45 PM Harold Bailey L. Harold Bailey DPT [phone]

## 2012-12-11 NOTE — Care Management Note (Signed)
CARE MANAGEMENT NOTE 12/11/2012  Patient:  Harold Bailey, Harold Bailey   Account Number:  [id number]  Date Initiated:  12/08/2012  Documentation initiated by:  [facility]  Subjective/Objective Assessment:   OPEN REDUCTION INTERNAL FIXATION (ORIF) ANKLE FRACTURE left  HARDWARE REMOVAL     Action/Plan:   waiting PT/OT recommendation   Anticipated DC Date:  12/11/2012   Anticipated DC Plan:  HOME W HOME HEALTH SERVICES      DC Planning Services  CM consult      Choice offered to / List presented to:             Status of service:  Completed, signed off Medicare Important Message given?   (If response is "NO", the following Medicare IM given date fields will be blank) Date Medicare IM given:   Date Additional Medicare IM given:    Discharge Disposition:  SKILLED NURSING FACILITY  Per UR Regulation:  Reviewed for med. necessity/level of care/duration of stay  If discussed at Long Length of Stay Meetings, dates discussed:    Comments:

## 2012-12-11 NOTE — Progress Notes (Signed)
Discharge Note: Pt is alert and oriented, VS are stable, denies CP.  Discharge instructions reviewed with patient's wife, verbalizes understanding. Report given to nurse at [facility]. Ambulance transportation provided, all belongings with patient

## 2012-12-11 NOTE — Progress Notes (Signed)
Patient ID: Harold Bailey, male   DOB: Jul 09, 1966, 47 y.o.   MRN: [id number] Plan for discharge to skilled nursing facility today. Prescriptions on the chart. Dressing clean and dry. Followup in the office in 2 weeks.

## 2013-01-01 ENCOUNTER — Inpatient Hospital Stay (HOSPITAL_COMMUNITY)
Admission: AD | Admit: 2013-01-01 | Discharge: 2013-01-05 | DRG: 496 | Disposition: A | Discharge status: Skilled Nursing Facility | Payer: Medicare Other | Source: Ambulatory Visit | Attending: Orthopedic Surgery | Admitting: Orthopedic Surgery

## 2013-01-01 ENCOUNTER — Inpatient Hospital Stay (HOSPITAL_COMMUNITY): Payer: Medicare Other | Admitting: Certified Registered"

## 2013-01-01 ENCOUNTER — Encounter (HOSPITAL_COMMUNITY): Payer: Self-pay | Admitting: Certified Registered"

## 2013-01-01 ENCOUNTER — Other Ambulatory Visit (HOSPITAL_COMMUNITY): Payer: Self-pay | Admitting: Orthopedic Surgery

## 2013-01-01 ENCOUNTER — Encounter (HOSPITAL_COMMUNITY)
Admission: AD | Disposition: A | Discharge status: Skilled Nursing Facility | Payer: Self-pay | Source: Ambulatory Visit | Attending: Orthopedic Surgery

## 2013-01-01 DIAGNOSIS — L02419 Cutaneous abscess of limb, unspecified: Secondary | ICD-10-CM | POA: Diagnosis present

## 2013-01-01 DIAGNOSIS — F411 Generalized anxiety disorder: Secondary | ICD-10-CM | POA: Diagnosis present

## 2013-01-01 DIAGNOSIS — Z79899 Other long term (current) drug therapy: Secondary | ICD-10-CM

## 2013-01-01 DIAGNOSIS — K219 Gastro-esophageal reflux disease without esophagitis: Secondary | ICD-10-CM | POA: Diagnosis present

## 2013-01-01 DIAGNOSIS — M869 Osteomyelitis, unspecified: Principal | ICD-10-CM | POA: Diagnosis present

## 2013-01-01 DIAGNOSIS — F17200 Nicotine dependence, unspecified, uncomplicated: Secondary | ICD-10-CM | POA: Diagnosis present

## 2013-01-01 DIAGNOSIS — A4902 Methicillin resistant Staphylococcus aureus infection, unspecified site: Secondary | ICD-10-CM | POA: Diagnosis present

## 2013-01-01 DIAGNOSIS — M86172 Other acute osteomyelitis, left ankle and foot: Secondary | ICD-10-CM

## 2013-01-01 DIAGNOSIS — I10 Essential (primary) hypertension: Secondary | ICD-10-CM | POA: Diagnosis present

## 2013-01-01 HISTORY — PX: I & D EXTREMITY: SHX5045

## 2013-01-01 LAB — COMPREHENSIVE METABOLIC PANEL
Albumin: 3.6 g/dL (ref 3.5–5.2)
Alkaline Phosphatase: 88 U/L (ref 39–117)
BUN: 21 mg/dL (ref 6–23)
Calcium: 9.8 mg/dL (ref 8.4–10.5)
Creatinine, Ser: 1 mg/dL (ref 0.50–1.35)
GFR calc Af Amer: >90 mL/min (ref >90)
Potassium: 4.6 mEq/L (ref 3.5–5.1)
Total Protein: 7.2 g/dL (ref 6.0–8.3)

## 2013-01-01 LAB — CBC
HCT: 42.1 % (ref 39.0–52.0)
MCH: 32.8 pg (ref 26.0–34.0)
MCHC: 36.3 g/dL — ABNORMAL HIGH (ref 30.0–36.0)
RDW: 13 % (ref 11.5–15.5)

## 2013-01-01 LAB — PROTIME-INR
INR: 1 (ref 0.00–1.49)
Prothrombin Time: 13.1 seconds (ref 11.6–15.2)

## 2013-01-01 LAB — APTT: aPTT: 31 seconds (ref 24–37)

## 2013-01-01 SURGERY — IRRIGATION AND DEBRIDEMENT EXTREMITY
Anesthesia: General | Site: Ankle | Laterality: Left | Wound class: Dirty or Infected

## 2013-01-01 MED ORDER — LIDOCAINE HCL (CARDIAC) 20 MG/ML IV SOLN
INTRAVENOUS | Status: DC | PRN
  Administered 2013-01-01: 60 mg via INTRAVENOUS

## 2013-01-01 MED ORDER — HYDROCODONE-ACETAMINOPHEN 10-325 MG PO TABS
1.0000 | ORAL_TABLET | ORAL | Status: DC | PRN
  Administered 2013-01-01 – 2013-01-04 (×3): 2 via ORAL
  Filled 2013-01-01 (×3): qty 2

## 2013-01-01 MED ORDER — MIDAZOLAM HCL 5 MG/5ML IJ SOLN
INTRAMUSCULAR | Status: DC | PRN
  Administered 2013-01-01: 2 mg via INTRAVENOUS

## 2013-01-01 MED ORDER — 0.9 % SODIUM CHLORIDE (POUR BTL) OPTIME
TOPICAL | Status: DC | PRN
  Administered 2013-01-01: 1000 mL

## 2013-01-01 MED ORDER — ONDANSETRON HCL 4 MG PO TABS: 4.0000 mg | ORAL_TABLET | Freq: Four times a day (QID) | ORAL | Status: DC | PRN

## 2013-01-01 MED ORDER — PROPOFOL 10 MG/ML IV BOLUS
INTRAVENOUS | Status: DC | PRN
  Administered 2013-01-01: 140 mg via INTRAVENOUS
  Administered 2013-01-01: 60 mg via INTRAVENOUS

## 2013-01-01 MED ORDER — PIPERACILLIN-TAZOBACTAM 3.375 G IVPB 30 MIN
3.3750 g | Freq: Once | INTRAVENOUS | Status: AC
  Administered 2013-01-01: 3.375 g via INTRAVENOUS
  Filled 2013-01-01: qty 50

## 2013-01-01 MED ORDER — METOCLOPRAMIDE HCL 10 MG PO TABS: 5.0000 mg | ORAL_TABLET | Freq: Three times a day (TID) | ORAL | Status: DC | PRN

## 2013-01-01 MED ORDER — VANCOMYCIN HCL 500 MG IV SOLR
INTRAVENOUS | Status: AC
  Filled 2013-01-01: qty 500

## 2013-01-01 MED ORDER — VANCOMYCIN HCL IN DEXTROSE 1-5 GM/200ML-% IV SOLN
1000.0000 mg | Freq: Once | INTRAVENOUS | Status: AC
  Administered 2013-01-01: 1000 mg via INTRAVENOUS
  Filled 2013-01-01: qty 200

## 2013-01-01 MED ORDER — SODIUM CHLORIDE 0.9 % IJ SOLN
10.0000 mL | INTRAMUSCULAR | Status: DC | PRN
  Administered 2013-01-02 – 2013-01-05 (×3): 10 mL

## 2013-01-01 MED ORDER — HYDROMORPHONE HCL PF 1 MG/ML IJ SOLN
0.5000 mg | INTRAMUSCULAR | Status: DC | PRN
  Administered 2013-01-01 – 2013-01-05 (×11): 1 mg via INTRAVENOUS
  Filled 2013-01-01 (×11): qty 1

## 2013-01-01 MED ORDER — SUCCINYLCHOLINE CHLORIDE 20 MG/ML IJ SOLN
INTRAMUSCULAR | Status: DC | PRN
  Administered 2013-01-01: 100 mg via INTRAVENOUS

## 2013-01-01 MED ORDER — CHLORHEXIDINE GLUCONATE 4 % EX LIQD
60.0000 mL | Freq: Once | CUTANEOUS | Status: AC
  Administered 2013-01-01: 4 via TOPICAL
  Filled 2013-01-01: qty 60

## 2013-01-01 MED ORDER — OXYCODONE-ACETAMINOPHEN 5-325 MG PO TABS
1.0000 | ORAL_TABLET | ORAL | Status: DC | PRN
  Administered 2013-01-01 – 2013-01-05 (×14): 2 via ORAL
  Filled 2013-01-01 (×15): qty 2

## 2013-01-01 MED ORDER — SODIUM CHLORIDE 0.9 % IV SOLN
INTRAVENOUS | Status: DC
  Administered 2013-01-01: 12:00:00 via INTRAVENOUS

## 2013-01-01 MED ORDER — PIPERACILLIN-TAZOBACTAM 3.375 G IVPB
3.3750 g | Freq: Three times a day (TID) | INTRAVENOUS | Status: DC
  Administered 2013-01-01 – 2013-01-05 (×12): 3.375 g via INTRAVENOUS
  Filled 2013-01-01 (×15): qty 50

## 2013-01-01 MED ORDER — VANCOMYCIN HCL IN DEXTROSE 1-5 GM/200ML-% IV SOLN
1000.0000 mg | Freq: Two times a day (BID) | INTRAVENOUS | Status: DC
  Filled 2013-01-01: qty 200

## 2013-01-01 MED ORDER — ONDANSETRON HCL 4 MG/2ML IJ SOLN: 4.0000 mg | Freq: Four times a day (QID) | INTRAMUSCULAR | Status: DC | PRN

## 2013-01-01 MED ORDER — HYDROMORPHONE HCL PF 1 MG/ML IJ SOLN
0.5000 mg | INTRAMUSCULAR | Status: DC | PRN
  Administered 2013-01-01: 1 mg via INTRAVENOUS
  Filled 2013-01-01: qty 1

## 2013-01-01 MED ORDER — PIPERACILLIN-TAZOBACTAM 3.375 G IVPB
3.3750 g | Freq: Four times a day (QID) | INTRAVENOUS | Status: DC
  Filled 2013-01-01 (×3): qty 50

## 2013-01-01 MED ORDER — SODIUM CHLORIDE 0.9 % IV SOLN: INTRAVENOUS | Status: DC

## 2013-01-01 MED ORDER — VANCOMYCIN HCL 500 MG IV SOLR
INTRAVENOUS | Status: DC | PRN
  Administered 2013-01-01: 500 mg

## 2013-01-01 MED ORDER — HYDROMORPHONE HCL PF 1 MG/ML IJ SOLN
INTRAMUSCULAR | Status: AC
  Filled 2013-01-01: qty 1

## 2013-01-01 MED ORDER — METOCLOPRAMIDE HCL 5 MG/ML IJ SOLN: 5.0000 mg | Freq: Three times a day (TID) | INTRAMUSCULAR | Status: DC | PRN

## 2013-01-01 MED ORDER — LACTATED RINGERS IV SOLN
INTRAVENOUS | Status: DC
  Administered 2013-01-01: 14:00:00 via INTRAVENOUS

## 2013-01-01 MED ORDER — VANCOMYCIN HCL IN DEXTROSE 1-5 GM/200ML-% IV SOLN
1000.0000 mg | Freq: Three times a day (TID) | INTRAVENOUS | Status: DC
  Administered 2013-01-01 – 2013-01-02 (×2): 1000 mg via INTRAVENOUS
  Filled 2013-01-01 (×4): qty 200

## 2013-01-01 MED ORDER — WARFARIN - PHARMACIST DOSING INPATIENT: Freq: Every day | Status: DC

## 2013-01-01 MED ORDER — HYDROMORPHONE HCL PF 1 MG/ML IJ SOLN
INTRAMUSCULAR | Status: AC
  Administered 2013-01-01: 0.5 mg via INTRAVENOUS
  Filled 2013-01-01: qty 1

## 2013-01-01 MED ORDER — OXYCODONE HCL 5 MG PO TABS: 5.0000 mg | ORAL_TABLET | Freq: Once | ORAL | Status: DC | PRN

## 2013-01-01 MED ORDER — PATIENT'S GUIDE TO USING COUMADIN BOOK
Freq: Once | Status: DC
  Filled 2013-01-01: qty 1

## 2013-01-01 MED ORDER — WARFARIN VIDEO: Freq: Once | Status: DC

## 2013-01-01 MED ORDER — GENTAMICIN SULFATE 40 MG/ML IJ SOLN
INTRAMUSCULAR | Status: DC | PRN
  Administered 2013-01-01: 80 mg

## 2013-01-01 MED ORDER — HYDROMORPHONE HCL PF 1 MG/ML IJ SOLN
0.2500 mg | INTRAMUSCULAR | Status: DC | PRN
  Administered 2013-01-01: 0.5 mg via INTRAVENOUS

## 2013-01-01 MED ORDER — FENTANYL CITRATE 0.05 MG/ML IJ SOLN
INTRAMUSCULAR | Status: DC | PRN
  Administered 2013-01-01: 50 ug via INTRAVENOUS
  Administered 2013-01-01 (×2): 100 ug via INTRAVENOUS

## 2013-01-01 MED ORDER — SODIUM CHLORIDE 0.9 % IR SOLN
Status: DC | PRN
  Administered 2013-01-01: 3000 mL

## 2013-01-01 MED ORDER — OXYCODONE HCL 5 MG/5ML PO SOLN: 5.0000 mg | Freq: Once | ORAL | Status: DC | PRN

## 2013-01-01 MED ORDER — WARFARIN SODIUM 7.5 MG PO TABS
7.5000 mg | ORAL_TABLET | Freq: Once | ORAL | Status: AC
  Administered 2013-01-01: 7.5 mg via ORAL
  Filled 2013-01-01: qty 1

## 2013-01-01 MED ORDER — GENTAMICIN SULFATE 40 MG/ML IJ SOLN
INTRAMUSCULAR | Status: AC
  Filled 2013-01-01: qty 4

## 2013-01-01 SURGICAL SUPPLY — 45 items
BLADE SURG 10 STRL SS (BLADE) ×2 IMPLANT
BNDG COHESIVE 4X5 TAN STRL (GAUZE/BANDAGES/DRESSINGS) ×2 IMPLANT
BNDG COHESIVE 6X5 TAN STRL LF (GAUZE/BANDAGES/DRESSINGS) ×4 IMPLANT
BNDG GAUZE STRTCH 6 (GAUZE/BANDAGES/DRESSINGS) ×6 IMPLANT
CLOTH BEACON ORANGE TIMEOUT ST (SAFETY) ×2 IMPLANT
COTTON STERILE ROLL (GAUZE/BANDAGES/DRESSINGS) ×2 IMPLANT
COVER SURGICAL LIGHT HANDLE (MISCELLANEOUS) ×2 IMPLANT
CUFF TOURNIQUET SINGLE 18IN (TOURNIQUET CUFF) ×2 IMPLANT
CUFF TOURNIQUET SINGLE 24IN (TOURNIQUET CUFF) IMPLANT
CUFF TOURNIQUET SINGLE 34IN LL (TOURNIQUET CUFF) IMPLANT
CUFF TOURNIQUET SINGLE 44IN (TOURNIQUET CUFF) IMPLANT
DRAPE U-SHAPE 47X51 STRL (DRAPES) ×2 IMPLANT
DRSG ADAPTIC 3X8 NADH LF (GAUZE/BANDAGES/DRESSINGS) ×2 IMPLANT
DURAPREP 26ML APPLICATOR (WOUND CARE) ×2 IMPLANT
ELECT CAUTERY BLADE 6.4 (BLADE) ×1 IMPLANT
ELECT REM PT RETURN 9FT ADLT (ELECTROSURGICAL) ×2
ELECTRODE REM PT RTRN 9FT ADLT (ELECTROSURGICAL) IMPLANT
GLOVE BIOGEL PI IND STRL 9 (GLOVE) ×1 IMPLANT
GLOVE BIOGEL PI INDICATOR 9 (GLOVE) ×1
GLOVE SURG ORTHO 9.0 STRL STRW (GLOVE) ×2 IMPLANT
GOWN PREVENTION PLUS XLARGE (GOWN DISPOSABLE) ×2 IMPLANT
GOWN SRG XL XLNG 56XLVL 4 (GOWN DISPOSABLE) ×1 IMPLANT
GOWN STRL NON-REIN XL XLG LVL4 (GOWN DISPOSABLE) ×2
HANDPIECE INTERPULSE COAX TIP (DISPOSABLE) ×2
KIT BASIN OR (CUSTOM PROCEDURE TRAY) ×2 IMPLANT
KIT ROOM TURNOVER OR (KITS) ×2 IMPLANT
KIT STIMULAN RAPID CURE 5CC (Orthopedic Implant) ×1 IMPLANT
MANIFOLD NEPTUNE II (INSTRUMENTS) ×2 IMPLANT
NS IRRIG 1000ML POUR BTL (IV SOLUTION) ×2 IMPLANT
PACK ORTHO EXTREMITY (CUSTOM PROCEDURE TRAY) ×2 IMPLANT
PAD ARMBOARD 7.5X6 YLW CONV (MISCELLANEOUS) ×4 IMPLANT
PAD CAST 4YDX4 CTTN HI CHSV (CAST SUPPLIES) IMPLANT
PADDING CAST COTTON 4X4 STRL (CAST SUPPLIES) ×2
PADDING CAST COTTON 6X4 STRL (CAST SUPPLIES) ×2 IMPLANT
SET HNDPC FAN SPRY TIP SCT (DISPOSABLE) IMPLANT
SPONGE GAUZE 4X4 12PLY (GAUZE/BANDAGES/DRESSINGS) ×2 IMPLANT
SPONGE LAP 18X18 X RAY DECT (DISPOSABLE) ×2 IMPLANT
STOCKINETTE IMPERVIOUS 9X36 MD (GAUZE/BANDAGES/DRESSINGS) ×2 IMPLANT
TOWEL OR 17X24 6PK STRL BLUE (TOWEL DISPOSABLE) ×2 IMPLANT
TOWEL OR 17X26 10 PK STRL BLUE (TOWEL DISPOSABLE) ×2 IMPLANT
TUBE ANAEROBIC SPECIMEN COL (MISCELLANEOUS) ×1 IMPLANT
TUBE CONNECTING 12X1/4 (SUCTIONS) ×2 IMPLANT
UNDERPAD 30X30 INCONTINENT (UNDERPADS AND DIAPERS) ×2 IMPLANT
WATER STERILE IRR 1000ML POUR (IV SOLUTION) ×2 IMPLANT
YANKAUER SUCT BULB TIP NO VENT (SUCTIONS) ×2 IMPLANT

## 2013-01-01 NOTE — H&P (Signed)
Harold Bailey is an 47 y.o. male.   Chief Complaint: Cellulitis left ankle status post ankle fusion HPI: Patient is a 47 year old gentleman who states that postoperatively his incision was looking well. He states he then started rubbing different types of ointments on the open wound and developed a cellulitic infection after this. Patient presents at this time for irrigation debridement and placement of antibiotic beads.  Past Medical History  Diagnosis Date  . Hypertension   . Shortness of breath     Hx of smoking.  Marland Kitchen GERD (gastroesophageal reflux disease)   . 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  . Arthritis     Left ankle  . Polycythemia   . Anxiety     Past Surgical History  Procedure Laterality Date  . Ankle surgery  2003    Left  . Shoulder arthroscopy  2008    Left  . Brain surgery  2009    Biopsy  . Fracture surgery  2003    Left ankle  . Tympanostomy tube placement  1993  . Ankle arthroscopy  01/17/2012    Procedure: ANKLE ARTHROSCOPY;  Surgeon: Nadara Mustard, MD;  Location: [facility] OR;  Service: Orthopedics;  Laterality: Left;  . Inner ear surgery    . Ankle fusion Left 11/2012    Dr Lajoyce Corners  . Orif ankle fracture Left 12/08/2012    Procedure: OPEN REDUCTION INTERNAL FIXATION (ORIF) ANKLE FRACTURE;  Surgeon: Nadara Mustard, MD;  Location: [facility] OR;  Service: Orthopedics;  Laterality: Left;  Removal Deep Hardware, Take Down Non-Union, Revision Internal Fixation Left Ankle  . Hardware removal Left 12/08/2012    Procedure: HARDWARE REMOVAL;  Surgeon: Nadara Mustard, MD;  Location: [facility] OR;  Service: Orthopedics;  Laterality: Left;  Removal Deep Hardware, Take Down Non-Union, Revision Internal Fixation Left Ankle    No family history on file. Social History:  reports that he has been smoking Cigarettes.  He has a 37.5 pack-year smoking history. He has never used smokeless tobacco. He reports that he drinks about 7.2 ounces of  alcohol per week. He reports that he does not use illicit drugs.  Allergies: No Known Allergies  Medications Prior to Admission  Medication Sig Dispense Refill  . ALFALFA PO Take 4 capsules by mouth daily.       Marland Kitchen ALPRAZolam (XANAX) 1 MG tablet Take 1-2 mg by mouth at bedtime as needed for sleep or anxiety.       . bisoprolol-hydrochlorothiazide (ZIAC) 10-6.25 MG per tablet Take 1 tablet by mouth daily.      . celecoxib (CELEBREX) 200 MG capsule Take 200 mg by mouth daily.      . diclofenac sodium (VOLTAREN) 1 % GEL Apply 2 g topically 4 (four) times daily.      . divalproex (DEPAKOTE ER) 500 MG 24 hr tablet Take 500 mg by mouth daily.      Marland Kitchen doxycycline (DORYX) 100 MG EC tablet Take 100 mg by mouth 2 (two) times daily. X 10 days starting on 12/28/12      . fish oil-omega-3 fatty acids 1000 MG capsule Take 1,000 mg by mouth daily.       Marland Kitchen HYDROcodone-acetaminophen (NORCO) 10-325 MG per tablet Take 1 tablet by mouth 2 (two) times daily.       Marland Kitchen moxifloxacin (AVELOX) 400 MG tablet Take 400 mg by mouth daily. X 10 days. Last dose and when pt started unknown      .  Multiple Vitamin (MULTIVITAMIN WITH MINERALS) TABS Take 1 tablet by mouth daily.      . nicotine (NICODERM CQ - DOSED IN MG/24 HOURS) 21 mg/24hr patch Place 1 patch onto the skin daily.      Marland Kitchen oxyCODONE-acetaminophen (PERCOCET/ROXICET) 5-325 MG per tablet Take 1 tablet by mouth every 4 (four) hours as needed for pain.       Marland Kitchen sertraline (ZOLOFT) 50 MG tablet Take 50 mg by mouth daily.      Marland Kitchen topiramate (TOPAMAX) 15 MG capsule Take 15 mg by mouth 2 (two) times daily.        Results for orders placed during the hospital encounter of 01/01/13 (from the past 48 hour(s))  APTT     Status: None   Collection Time    01/01/13 11:12 AM      Result Value Range   aPTT 31  24 - 37 seconds  CBC     Status: Abnormal   Collection Time    01/01/13 11:12 AM      Result Value Range   WBC 9.9  4.0 - 10.5 K/uL   RBC 4.66  4.22 - 5.81 MIL/uL    Hemoglobin 15.3  13.0 - 17.0 g/dL   HCT 16.1  09.6 - 04.5 %   MCV 90.3  78.0 - 100.0 fL   MCH 32.8  26.0 - 34.0 pg   MCHC 36.3 (*) 30.0 - 36.0 g/dL   RDW 40.9  81.1 - 91.4 %   Platelets 170  150 - 400 K/uL  COMPREHENSIVE METABOLIC PANEL     Status: Abnormal   Collection Time    01/01/13 11:12 AM      Result Value Range   Sodium 137  135 - 145 mEq/L   Potassium 4.6  3.5 - 5.1 mEq/L   Chloride 102  96 - 112 mEq/L   CO2 26  19 - 32 mEq/L   Glucose, Bld 91  70 - 99 mg/dL   BUN 21  6 - 23 mg/dL   Creatinine, Ser 7.82  0.50 - 1.35 mg/dL   Calcium 9.8  8.4 - 95.6 mg/dL   Total Protein 7.2  6.0 - 8.3 g/dL   Albumin 3.6  3.5 - 5.2 g/dL   AST 22  0 - 37 U/L   ALT 23  0 - 53 U/L   Alkaline Phosphatase 88  39 - 117 U/L   Total Bilirubin 0.4  0.3 - 1.2 mg/dL   GFR calc non Af Amer 87 (*) >90 mL/min   GFR calc Af Amer >90  >90 mL/min   Comment:            The eGFR has been calculated     using the CKD EPI equation.     This calculation has not been     validated in all clinical     situations.     eGFR's persistently     <90 mL/min signify     possible Chronic Kidney Disease.  PROTIME-INR     Status: None   Collection Time    01/01/13 11:12 AM      Result Value Range   Prothrombin Time 13.1  11.6 - 15.2 seconds   INR 1.00  0.00 - 1.49   No results found.  Review of Systems  All other systems reviewed and are negative.    Blood pressure 106/66, pulse 59, temperature 97.6 F (36.4 C), resp. rate 18, SpO2 99.00%. Physical Exam  On examination patient  has cellulitis and clear serosanguineous drainage from the incision. There is concern for possible infection due to the deep retained hardware and we will plan for irrigation and debridement. Assessment/Plan Assessment: Infection cellulitis left ankle status post ankle fusion.  Plan: Will plan for irrigation debridement excisional debridement of tissue plan for placement of deep antibiotic beads with vancomycin and gentamicin and  anticipate partly 6 weeks of IV antibiotics postoperatively. Risks and benefits were discussed with the patient including infection neurovascular injury nonhealing of the wound need for additional surgery. Patient states he understands was pursued this time.  Harold Bailey 01/01/2013, 3:01 PM

## 2013-01-01 NOTE — Anesthesia Preprocedure Evaluation (Addendum)
Anesthesia Evaluation  Patient identified by MRN, date of birth, ID band Patient awake    Reviewed: Allergy & Precautions, H&P , NPO status , Patient's Chart, lab work & pertinent test results  Airway Mallampati: II TM Distance: >3 FB Neck ROM: Full    Dental   Pulmonary shortness of breath,  breath sounds clear to auscultation        Cardiovascular hypertension, Pt. on medications and Pt. on home beta blockers Rate:Normal     Neuro/Psych    GI/Hepatic GERD-  Medicated and Controlled,  Endo/Other    Renal/GU      Musculoskeletal   Abdominal   Peds  Hematology   Anesthesia Other Findings   Reproductive/Obstetrics                         Anesthesia Physical Anesthesia Plan  ASA: II  Anesthesia Plan: General   Post-op Pain Management:    Induction:   Airway Management Planned: LMA  Additional Equipment:   Intra-op Plan:   Post-operative Plan:   Informed Consent: I have reviewed the patients History and Physical, chart, labs and discussed the procedure including the risks, benefits and alternatives for the proposed anesthesia with the patient or authorized representative who has indicated his/her understanding and acceptance.   Dental advisory given  Plan Discussed with: CRNA, Surgeon and Anesthesiologist  Anesthesia Plan Comments:        Anesthesia Quick Evaluation

## 2013-01-01 NOTE — Anesthesia Postprocedure Evaluation (Signed)
Anesthesia Post Note  Patient: Harold Bailey  Procedure(s) Performed: Procedure(s) (LRB): IRRIGATION AND DEBRIDEMENT EXTREMITY (Left)  Anesthesia type: General  Patient location: PACU  Post pain: Pain level controlled  Post assessment: Patient's Cardiovascular Status Stable  Last Vitals:  Filed Vitals:   01/01/13 1700  BP: 116/83  Pulse: 68  Temp:   Resp: 10    Post vital signs: Reviewed and stable  Level of consciousness: alert  Complications: No apparent anesthesia complications

## 2013-01-01 NOTE — Progress Notes (Signed)
Orthopedic Tech Progress Note Patient Details:  Harold Bailey 01-13-1966 [id number]  Ortho Devices Type of Ortho Device: Postop shoe/boot Ortho Device/Splint Location: (L) LE Ortho Device/Splint Interventions: Ordered;Application   Jennye Moccasin 01/01/2013, 4:59 PM

## 2013-01-01 NOTE — Transfer of Care (Signed)
Immediate Anesthesia Transfer of Care Note  Patient: Harold Bailey  Procedure(s) Performed: Procedure(s) with comments: IRRIGATION AND DEBRIDEMENT EXTREMITY (Left) - Irrigation, Debridement and Placement Antibiotic Beads Left Ankle  Patient Location: PACU  Anesthesia Type:General  Level of Consciousness: awake, alert  and oriented  Airway & Oxygen Therapy: Patient Spontanous Breathing and Patient connected to nasal cannula oxygen  Post-op Assessment: Report given to PACU RN, Post -op Vital signs reviewed and stable and Patient moving all extremities  Post vital signs: Reviewed and stable  Complications: No apparent anesthesia complications

## 2013-01-01 NOTE — Preoperative (Signed)
Beta Blockers   Reason not to administer Beta Blockers:Ziac taken by patient at 0700 hrs on 01/01/2013

## 2013-01-01 NOTE — Progress Notes (Signed)
Peripherally Inserted Central Catheter/Midline Placement  The IV Nurse has discussed with the patient and/or persons authorized to consent for the patient, the purpose of this procedure and the potential benefits and risks involved with this procedure.  The benefits include less needle sticks, lab draws from the catheter and patient may be discharged home with the catheter.  Risks include, but not limited to, infection, bleeding, blood clot (thrombus formation), and puncture of an artery; nerve damage and irregular heat beat.  Alternatives to this procedure were also discussed.  PICC/Midline Placement Documentation        Jhoselyn Ruffini, Lajean Manes 01/01/2013, 9:50 PM

## 2013-01-01 NOTE — Op Note (Signed)
OPERATIVE REPORT  DATE OF SURGERY: 01/01/2013  PATIENT:  Harold Bailey,  47 y.o. male  PRE-OPERATIVE DIAGNOSIS:  Abscess Left Ankle Fusion  POST-OPERATIVE DIAGNOSIS:  Abscess Left Ankle Fusion  PROCEDURE:  Procedure(s): IRRIGATION AND DEBRIDEMENT EXTREMITY Excision of bone soft tissue muscle left ankle Placement of antibiotic beads vancomycin 500 mg gentamicin 160 mg with stimulant beads Cultures x2  SURGEON:  Surgeon(s): Nadara Mustard, MD  ANESTHESIA:   general  EBL:  Minimal ML  SPECIMEN:  No Specimen  TOURNIQUET:  * No tourniquets in log *  PROCEDURE DETAILS: Patient is a 47 year old gentleman who is possibly 1 month status post revision fusion left ankle. Patient had use multiple appointments over an open wound and he developed infection shortly after this. Patient presents at this time with cellulitis and infection of the lateral incision from the ankle fusion. Risks and benefits were discussed including persistent infection nonhealing of the wound nonhealing of the bone need for additional surgery. Patient states he understands and wished to proceed at this time. Description of procedure patient brought to the operating room and underwent a general anesthetic. After adequate levels and anesthesia obtained patient's left lower extremity was prepped using DuraPrep draped in a sterile field. A lateral incision was made through his previous incision this was carried down to the fusion site. There was nonviable tissue which was debrided. Patient had excision of skin soft tissue and muscle. A Ronjair knife were used for the excisional debridement. The wound was irrigated with pulsatile lavage. The distal aspect the fibula was resected. Antibiotic beads were mixed and the beads were placed within the fusion site with 500 mg vancomycin and 160 mg of gentamicin. The incision was closed using 2-0 nylon. The wound is covered with Adaptic orthopedic sponges AB dressing Kerlix and Coban.  Patient was extubated taken the PACU in stable condition plan for placement of a PICC line and 6 weeks of IV antibiotics.  PLAN OF CARE: Admit to inpatient   PATIENT DISPOSITION:  PACU - hemodynamically stable.   Nadara Mustard, MD 01/01/2013 4:38 PM

## 2013-01-01 NOTE — Progress Notes (Signed)
ANTICOAGULATION and Vancomycin CONSULT NOTE  Pharmacy Consult for vancomycin and warfarin Indication: VTE prophylaxis and ankle infection, s/p removal of hardward on 01/01/13  No Known Allergies  Vital Signs: Temp: 97.6 F (36.4 C) (04/11 1715) BP: 111/75 mmHg (04/11 1715) Pulse Rate: 66 (04/11 1715)  Weight - 97kg on 12/08/12  Labs:  Recent Labs  01/01/13 1112  HGB 15.3  HCT 42.1  PLT 170  APTT 31  LABPROT 13.1  INR 1.00  CREATININE 1.00    The CrCl is unknown because both a height and weight (above a minimum accepted value) are required for this calculation.   Medical History: Past Medical History  Diagnosis Date  . Hypertension   . Shortness of breath     Hx of smoking.  Marland Kitchen GERD (gastroesophageal reflux disease)   . 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  . Arthritis     Left ankle  . Polycythemia   . Anxiety     Medications:  Prescriptions prior to admission  Medication Sig Dispense Refill  . ALFALFA PO Take 4 capsules by mouth daily.       Marland Kitchen ALPRAZolam (XANAX) 1 MG tablet Take 1-2 mg by mouth at bedtime as needed for sleep or anxiety.       . bisoprolol-hydrochlorothiazide (ZIAC) 10-6.25 MG per tablet Take 1 tablet by mouth daily.      . celecoxib (CELEBREX) 200 MG capsule Take 200 mg by mouth daily.      . diclofenac sodium (VOLTAREN) 1 % GEL Apply 2 g topically 4 (four) times daily.      . divalproex (DEPAKOTE ER) 500 MG 24 hr tablet Take 500 mg by mouth daily.      Marland Kitchen doxycycline (DORYX) 100 MG EC tablet Take 100 mg by mouth 2 (two) times daily. X 10 days starting on 12/28/12      . fish oil-omega-3 fatty acids 1000 MG capsule Take 1,000 mg by mouth daily.       Marland Kitchen HYDROcodone-acetaminophen (NORCO) 10-325 MG per tablet Take 1 tablet by mouth 2 (two) times daily.       Marland Kitchen moxifloxacin (AVELOX) 400 MG tablet Take 400 mg by mouth daily. X 10 days. Last dose and when pt started unknown      . Multiple Vitamin  (MULTIVITAMIN WITH MINERALS) TABS Take 1 tablet by mouth daily.      . nicotine (NICODERM CQ - DOSED IN MG/24 HOURS) 21 mg/24hr patch Place 1 patch onto the skin daily.      Marland Kitchen oxyCODONE-acetaminophen (PERCOCET/ROXICET) 5-325 MG per tablet Take 1 tablet by mouth every 4 (four) hours as needed for pain.       Marland Kitchen sertraline (ZOLOFT) 50 MG tablet Take 50 mg by mouth daily.      Marland Kitchen topiramate (TOPAMAX) 15 MG capsule Take 15 mg by mouth 2 (two) times daily.        Assessment: 47 year old man s/p I&D of left ankle fusion 4/11.  Vancomycin to start as empiric therapy for infection and warfarin to start for VTE prophylaxis.  Goal of Therapy:  INR 2-3 Vancomycin trough 15-20 mg/L   Plan:   Warfarin 7.5mg  x 1 dose today  Daily protimes  Vancomycin 1g IV q8h  Follow up cultures and renal function.  Will obtain vancomycin trough as appropriate.  Mickeal Skinner 01/01/2013,5:58 PM

## 2013-01-02 MED ORDER — VANCOMYCIN HCL 10 G IV SOLR
1250.0000 mg | Freq: Three times a day (TID) | INTRAVENOUS | Status: DC
  Administered 2013-01-02 – 2013-01-05 (×9): 1250 mg via INTRAVENOUS
  Filled 2013-01-02 (×13): qty 1250

## 2013-01-02 MED ORDER — WARFARIN SODIUM 10 MG PO TABS
10.0000 mg | ORAL_TABLET | Freq: Once | ORAL | Status: AC
  Administered 2013-01-02: 10 mg via ORAL
  Filled 2013-01-02: qty 1

## 2013-01-02 NOTE — Progress Notes (Addendum)
ANTICOAGULATION and Vancomycin CONSULT NOTE  Pharmacy Consult for vancomycin and warfarin Indication: VTE prophylaxis and ankle infection, s/p removal of hardward on 01/01/13  No Known Allergies  Vital Signs: Temp: 97.8 F (36.6 C) (04/12 0639) BP: 142/77 mmHg (04/12 0639) Pulse Rate: 87 (04/12 0639)  Weight - 97kg on 12/08/12  Labs:  Recent Labs  01/01/13 1112 01/02/13 0600  HGB 15.3  --   HCT 42.1  --   PLT 170  --   APTT 31  --   LABPROT 13.1 12.7  INR 1.00 0.96  CREATININE 1.00  --     The CrCl is unknown because both a height and weight (above a minimum accepted value) are required for this calculation.   Medical History: Past Medical History  Diagnosis Date  . Hypertension   . Shortness of breath     Hx of smoking.  Marland Kitchen GERD (gastroesophageal reflux disease)   . 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  . Arthritis     Left ankle  . Polycythemia   . Anxiety     Medications:  Prescriptions prior to admission  Medication Sig Dispense Refill  . ALFALFA PO Take 4 capsules by mouth daily.       Marland Kitchen ALPRAZolam (XANAX) 1 MG tablet Take 1-2 mg by mouth at bedtime as needed for sleep or anxiety.       . bisoprolol-hydrochlorothiazide (ZIAC) 10-6.25 MG per tablet Take 1 tablet by mouth daily.      . celecoxib (CELEBREX) 200 MG capsule Take 200 mg by mouth daily.      . diclofenac sodium (VOLTAREN) 1 % GEL Apply 2 g topically 4 (four) times daily.      . divalproex (DEPAKOTE ER) 500 MG 24 hr tablet Take 500 mg by mouth daily.      Marland Kitchen doxycycline (DORYX) 100 MG EC tablet Take 100 mg by mouth 2 (two) times daily. X 10 days starting on 12/28/12      . fish oil-omega-3 fatty acids 1000 MG capsule Take 1,000 mg by mouth daily.       Marland Kitchen HYDROcodone-acetaminophen (NORCO) 10-325 MG per tablet Take 1 tablet by mouth 2 (two) times daily.       Marland Kitchen moxifloxacin (AVELOX) 400 MG tablet Take 400 mg by mouth daily. X 10 days. Last dose and  when pt started unknown      . Multiple Vitamin (MULTIVITAMIN WITH MINERALS) TABS Take 1 tablet by mouth daily.      . nicotine (NICODERM CQ - DOSED IN MG/24 HOURS) 21 mg/24hr patch Place 1 patch onto the skin daily.      Marland Kitchen oxyCODONE-acetaminophen (PERCOCET/ROXICET) 5-325 MG per tablet Take 1 tablet by mouth every 4 (four) hours as needed for pain.       Marland Kitchen sertraline (ZOLOFT) 50 MG tablet Take 50 mg by mouth daily.      Marland Kitchen topiramate (TOPAMAX) 15 MG capsule Take 15 mg by mouth 2 (two) times daily.        Assessment: 47 year old man s/p I&D of left ankle fusion 4/11. On Coumadin for VTE prophylaxis, INR 0.96 this morning. No bleeding reported per chart  Goal of Therapy:  INR 2-3   Plan:   Warfarin 10 mg x 1 dose today  Daily PT/INR  Coumadin education with Pharmacist  Bayard Hugger, PharmD, BCPS  Clinical Pharmacist  Pager: [phone]   01/02/2013,10:34 AM  Addendum:  Patient  on vancomycin and Zosyn D#2 for post-op ankle infection s/p I&D. Per MD notes, will need 6 weeks of IV abx. PICC line placed. Vancomycin trough subthrepeutic = 11.2   Plan:  - Increase vancomycin to 1250 mg IV Q 8  - f/u renal function - recheck vancomycin trough at steady state if needed.

## 2013-01-02 NOTE — Progress Notes (Signed)
Subjective: 1 Day Post-Op Procedure(s) (LRB): IRRIGATION AND DEBRIDEMENT EXTREMITY (Left) Patient reports pain as moderate.  Tolerating diet well.   Objective: Vital signs in last 24 hours: Temp:  [97.6 F (36.4 C)-98.9 F (37.2 C)] 97.8 F (36.6 C) (04/12 0639) Pulse Rate:  [59-87] 87 (04/12 0639) Resp:  [10-18] 18 (04/12 0639) BP: (106-142)/(66-83) 142/77 mmHg (04/12 0639) SpO2:  [92 %-100 %] 94 % (04/12 0639)  Intake/Output from previous day: 04/11 0701 - 04/12 0700 In: 1358.3 [I.V.:1108.3; IV Piggyback:250] Out: 750 [Urine:750] Intake/Output this shift: Total I/O In: -  Out: 200 [Urine:200]   Recent Labs  01/01/13 1112  HGB 15.3    Recent Labs  01/01/13 1112  WBC 9.9  RBC 4.66  HCT 42.1  PLT 170    Recent Labs  01/01/13 1112  NA 137  K 4.6  CL 102  CO2 26  BUN 21  CREATININE 1.00  GLUCOSE 91  CALCIUM 9.8    Recent Labs  01/01/13 1112 01/02/13 0600  INR 1.00 0.96    Neurovascular intact Sensation intact distally Intact pulses distally Incision: dressing C/D/I Compartment soft  Assessment/Plan: 1 Day Post-Op Procedure(s) (LRB): IRRIGATION AND DEBRIDEMENT EXTREMITY (Left) Continue current care WBC trending down afebrile on IV antibiotics Encourage elevation and wiggling of toes  CLARK, GILBERT 01/02/2013, 12:45 PM

## 2013-01-02 NOTE — Evaluation (Signed)
Physical Therapy Evaluation Patient Details Name: Harold Bailey MRN: [id number] DOB: 1966-03-08 Today's Date: 01/02/2013 Time: 0865-7846 PT Time Calculation (min): 29 min  PT Assessment / Plan / Recommendation Clinical Impression  Pt reports he received therapy at [facility] SNF following L ankle ORIF ~ 1 month ago.  Pt had only returned home for a few days prior to current admission.  Pt is at high risk for falls due to cognitive deficits and impulsiveness.  Recommend [facility] SNF at d/c.    PT Assessment  Patient needs continued PT services    Follow Up Recommendations  Supervision/Assistance - 24 hour;SNF    Does the patient have the potential to tolerate intense rehabilitation      Barriers to Discharge Decreased caregiver support;Inaccessible home environment      Equipment Recommendations  Rolling walker with 5" wheels (with R platform attatchment)    Recommendations for Other Services     Frequency Min 3X/week    Precautions / Restrictions Precautions Precautions: Fall Precaution Comments: Pt is very impulsive. Restrictions Weight Bearing Restrictions: Yes RUE Weight Bearing:  (Pt in short arm cast.  No WB orders.) LLE Weight Bearing: Touchdown weight bearing   Pertinent Vitals/Pain 9/10      Mobility  Bed Mobility Supine to Sit: 7: Independent Sit to Supine: 7: Independent Transfers Sit to Stand: 5: Supervision;From bed Stand to Sit: 5: Supervision;To bed;To chair/3-in-1 Stand Pivot Transfers: 5: Supervision Details for Transfer Assistance: Pt impulsive with decreased safety awareness. Ambulation/Gait Ambulation/Gait Assistance: 4: Min guard Ambulation Distance (Feet): 25 Feet Assistive device: Right platform walker Gait Pattern: Step-to pattern    Exercises     PT Diagnosis: Difficulty walking;Acute pain  PT Problem List: Decreased safety awareness;Decreased mobility;Pain PT Treatment Interventions: Gait training;DME instruction;Functional  mobility training;Therapeutic activities;Balance training;Patient/family education   PT Goals Acute Rehab PT Goals PT Goal Formulation: With patient Time For Goal Achievement: 01/09/13 Potential to Achieve Goals: Good Pt will go Sit to Stand: Independently PT Goal: Sit to Stand - Progress: Goal set today Pt will go Stand to Sit: Independently PT Goal: Stand to Sit - Progress: Goal set today Pt will Transfer Bed to Chair/Chair to Bed: with modified independence PT Transfer Goal: Bed to Chair/Chair to Bed - Progress: Goal set today Pt will Ambulate: 51 - 150 feet;with supervision;with rolling walker;with other equipment (comment) (R platform attatchment) PT Goal: Ambulate - Progress: Goal set today PT Goal: Up/Down Stairs - Progress: Discontinued (comment) (goal from previous admission)  Visit Information  Last PT Received On: 01/02/13 Assistance Needed: +1    Subjective Data  Subjective: "I really don't like all these wires and tubes." Patient Stated Goal: home   Prior Functioning  Home Living Lives With: Alone Type of Home: Apartment Home Access: Stairs to enter Entrance Stairs-Rails: Right Home Layout: One level Home Adaptive Equipment: Crutches Prior Function Level of Independence: Independent with assistive device(s) Able to Take Stairs?: Yes Driving: Yes Vocation: Unemployed Communication Communication: No difficulties    Cognition  Cognition Overall Cognitive Status: History of cognitive impairments - at baseline Arousal/Alertness: Awake/alert Orientation Level: Oriented X4 / Intact Behavior During Session: Anxious    Extremity/Trunk Assessment     Balance    End of Session PT - End of Session Equipment Utilized During Treatment: Gait belt Activity Tolerance: Patient tolerated treatment well Patient left: in chair;with call bell/phone within reach Nurse Communication: Patient requests pain meds  GP     Ilda Foil 01/02/2013, 10:07 AM  Toniann Fail  Glorianne Manchester, PT  Office # [phone] Pager [phone]

## 2013-01-03 ENCOUNTER — Encounter (HOSPITAL_COMMUNITY): Payer: Self-pay | Admitting: Orthopedic Surgery

## 2013-01-03 MED ORDER — WARFARIN SODIUM 2.5 MG PO TABS
12.5000 mg | ORAL_TABLET | Freq: Once | ORAL | Status: AC
  Administered 2013-01-03: 12.5 mg via ORAL
  Filled 2013-01-03: qty 1

## 2013-01-03 NOTE — Progress Notes (Signed)
ANTICOAGULATION and Vancomycin CONSULT NOTE  Pharmacy Consult for warfarin Indication: VTE prophylaxis  No Known Allergies  Vital Signs: Temp: 97.5 F (36.4 C) (04/13 0511) BP: 105/60 mmHg (04/13 0511) Pulse Rate: 72 (04/13 0511)  Weight - 97kg on 12/08/12  Labs:  Recent Labs  01/01/13 1112 01/02/13 0600 01/03/13 0600  HGB 15.3  --   --   HCT 42.1  --   --   PLT 170  --   --   APTT 31  --   --   LABPROT 13.1 12.7 13.5  INR 1.00 0.96 1.04  CREATININE 1.00  --   --     The CrCl is unknown because both a height and weight (above a minimum accepted value) are required for this calculation.   Medical History: Past Medical History  Diagnosis Date  . Hypertension   . Shortness of breath     Hx of smoking.  Marland Kitchen GERD (gastroesophageal reflux disease)   . 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  . Arthritis     Left ankle  . Polycythemia   . Anxiety     Medications:  Prescriptions prior to admission  Medication Sig Dispense Refill  . ALFALFA PO Take 4 capsules by mouth daily.       Marland Kitchen ALPRAZolam (XANAX) 1 MG tablet Take 1-2 mg by mouth at bedtime as needed for sleep or anxiety.       . bisoprolol-hydrochlorothiazide (ZIAC) 10-6.25 MG per tablet Take 1 tablet by mouth daily.      . celecoxib (CELEBREX) 200 MG capsule Take 200 mg by mouth daily.      . diclofenac sodium (VOLTAREN) 1 % GEL Apply 2 g topically 4 (four) times daily.      . divalproex (DEPAKOTE ER) 500 MG 24 hr tablet Take 500 mg by mouth daily.      Marland Kitchen doxycycline (DORYX) 100 MG EC tablet Take 100 mg by mouth 2 (two) times daily. X 10 days starting on 12/28/12      . fish oil-omega-3 fatty acids 1000 MG capsule Take 1,000 mg by mouth daily.       Marland Kitchen HYDROcodone-acetaminophen (NORCO) 10-325 MG per tablet Take 1 tablet by mouth 2 (two) times daily.       Marland Kitchen moxifloxacin (AVELOX) 400 MG tablet Take 400 mg by mouth daily. X 10 days. Last dose and when pt started  unknown      . Multiple Vitamin (MULTIVITAMIN WITH MINERALS) TABS Take 1 tablet by mouth daily.      . nicotine (NICODERM CQ - DOSED IN MG/24 HOURS) 21 mg/24hr patch Place 1 patch onto the skin daily.      Marland Kitchen oxyCODONE-acetaminophen (PERCOCET/ROXICET) 5-325 MG per tablet Take 1 tablet by mouth every 4 (four) hours as needed for pain.       Marland Kitchen sertraline (ZOLOFT) 50 MG tablet Take 50 mg by mouth daily.      Marland Kitchen topiramate (TOPAMAX) 15 MG capsule Take 15 mg by mouth 2 (two) times daily.        Assessment: 47 year old man s/p I&D of left ankle fusion. On Coumadin for VTE prophylaxis, INR 1.04 this morning trending up very slowly. No bleeding reported per chart  Goal of Therapy:  INR 2-3   Plan:   Warfarin 12.5 mg x 1 dose today  Daily PT/INR  Bayard Hugger, PharmD, BCPS  Clinical Pharmacist  Pager: [phone]  01/03/2013,10:25 AM

## 2013-01-03 NOTE — Progress Notes (Signed)
Clinical Social Work Department BRIEF PSYCHOSOCIAL ASSESSMENT 01/03/2013  Patient:  Harold Bailey, Harold Bailey     Account Number:  [id number]     Admit date:  01/01/2013  Clinical Social Worker:  Illene Silver  Date/Time:  01/03/2013 04:26 PM  Referred by:  Physician  Date Referred:  01/03/2013 Referred for  SNF Placement   Other Referral:   Interview type:  Patient Other interview type:    PSYCHOSOCIAL DATA Living Status:  WIFE Admitted from facility:   Level of care:   Primary support name:  vickie Auguste Primary support relationship to patient:  SPOUSE Degree of support available:   wife unable to provide care for pt at d/c    CURRENT CONCERNS Current Concerns  Post-Acute Placement   Other Concerns:    SOCIAL WORK ASSESSMENT / PLAN CSW spoke with pt re: role of SW/dcp.  Pt is agreeable to go back to [facility] at d/c if they have a bed.  He is unsure about the other facilities and would have to check them out before he would go to one of them.  CSW to begin bed search.   Assessment/plan status:  Psychosocial Support/Ongoing Assessment of Needs Other assessment/ plan:   Information/referral to community resources:    PATIENT'S/FAMILY'S RESPONSE TO PLAN OF CARE: Pt pleasant.  Preoccupied with RN tending to him while CSW in room.

## 2013-01-03 NOTE — Progress Notes (Signed)
Subjective: 2 Days Post-Op Procedure(s) (LRB): IRRIGATION AND DEBRIDEMENT EXTREMITY (Left) Patient reports pain as moderate.  Working on mobility with therapy.  On IV antibiotics.  Objective: Vital signs in last 24 hours: Temp:  [97.5 F (36.4 C)-98 F (36.7 C)] 97.5 F (36.4 C) (04/13 0511) Pulse Rate:  [72-75] 72 (04/13 0511) Resp:  [18] 18 (04/13 0511) BP: (101-113)/(60-78) 105/60 mmHg (04/13 0511) SpO2:  [96 %-100 %] 100 % (04/13 0511)  Intake/Output from previous day: 04/12 0701 - 04/13 0700 In: 2040 [P.O.:2040] Out: 4125 [Urine:4125] Intake/Output this shift: Total I/O In: 720 [P.O.:720] Out: 1250 [Urine:1250]   Recent Labs  01/01/13 1112  HGB 15.3    Recent Labs  01/01/13 1112  WBC 9.9  RBC 4.66  HCT 42.1  PLT 170    Recent Labs  01/01/13 1112  NA 137  K 4.6  CL 102  CO2 26  BUN 21  CREATININE 1.00  GLUCOSE 91  CALCIUM 9.8    Recent Labs  01/02/13 0600 01/03/13 0600  INR 0.96 1.04    Incision: dressing C/D/I Toes well perfused  Assessment/Plan: 2 Days Post-Op Procedure(s) (LRB): IRRIGATION AND DEBRIDEMENT EXTREMITY (Left) Up with therapy Continue ABX therapy due to Post-op infection  Maurice Ramseur Y 01/03/2013, 7:00 AM

## 2013-01-04 LAB — PROTIME-INR
INR: 1.17 (ref 0.00–1.49)
Prothrombin Time: 14.7 seconds (ref 11.6–15.2)

## 2013-01-04 LAB — WOUND CULTURE

## 2013-01-04 MED ORDER — VANCOMYCIN HCL 10 G IV SOLR: 1250.0000 mg | Freq: Three times a day (TID) | INTRAVENOUS | Status: DC

## 2013-01-04 MED ORDER — OXYCODONE-ACETAMINOPHEN 5-325 MG PO TABS: 1.0000 | ORAL_TABLET | ORAL | Status: DC | PRN

## 2013-01-04 NOTE — Care Management Note (Signed)
CARE MANAGEMENT NOTE 01/04/2013  Patient:  Harold Bailey, Harold Bailey   Account Number:  [id number]  Date Initiated:  01/04/2013  Documentation initiated by:  Vance Peper  Subjective/Objective Assessment:   47 yr old male admitted with MRSA infection of left ankle.     Action/Plan:   Patient will be going to SNF with PICC line for continued IV medication treatment.  Social Worker is aware.   Anticipated DC Date:  01/05/2013   Anticipated DC Plan:  SKILLED NURSING FACILITY  In-house referral  Clinical Social Worker      DC Planning Services  CM consult      Choice offered to / List presented to:             Status of service:  Completed, signed off Medicare Important Message given?   (If response is "NO", the following Medicare IM given date fields will be blank) Date Medicare IM given:   Date Additional Medicare IM given:    Discharge Disposition:  SKILLED NURSING FACILITY  Per UR Regulation:    If discussed at Long Length of Stay Meetings, dates discussed:    Comments:

## 2013-01-04 NOTE — Progress Notes (Signed)
UR COMPLETED  

## 2013-01-04 NOTE — Progress Notes (Signed)
Patient was not offered a bed at [facility] and is now requesting a facility in [place]. [facility] has offered a bed but they are not able to accept the patient until tomorrow. SW informed MD   Sabino Niemann, [phone]

## 2013-01-04 NOTE — Progress Notes (Signed)
Physical Therapy Treatment Patient Details Name: Harold Bailey MRN: [id number] DOB: 1966-03-20 Today's Date: 01/04/2013 Time: 7829-5621 PT Time Calculation (min): 29 min  PT Assessment / Plan / Recommendation Comments on Treatment Session  Patient impulsive.  Patient needed a reminder to call nursing staff should he need to get up.  Patient ambulated about the same distance.      Follow Up Recommendations  Supervision/Assistance - 24 hour;SNF     Does the patient have the potential to tolerate intense rehabilitation     Barriers to Discharge        Equipment Recommendations  Rolling walker with 5" wheels (With platform attachment.)    Recommendations for Other Services    Frequency Min 3X/week   Plan Discharge plan remains appropriate;Frequency remains appropriate    Precautions / Restrictions Precautions Precautions: Fall Precaution Comments: Pt is very impulsive. Required Braces or Orthoses: Other Brace/Splint Other Brace/Splint: Ankle brace. Restrictions Weight Bearing Restrictions: Yes RUE Weight Bearing: Weight bearing as tolerated LLE Weight Bearing: Touchdown weight bearing   Pertinent Vitals/Pain 8/10 L Ankle pain.    Mobility  Bed Mobility Bed Mobility: Supine to Sit Supine to Sit: 7: Independent Details for Bed Mobility Assistance: Patient eager to stand on his own. Transfers Transfers: Sit to Stand;Stand to Sit Sit to Stand: 5: Supervision;With upper extremity assist;From bed Stand to Sit: To chair/3-in-1;To toilet;With upper extremity assist;With armrests;5: Supervision Details for Transfer Assistance: Pt impulsive with decreased safety awareness.  Supervision for safety. Ambulation/Gait Ambulation/Gait Assistance: 4: Min guard Ambulation Distance (Feet): 45 Feet Assistive device: Right platform walker Ambulation/Gait Assistance Details: Continues to require close supervision.  Questionable NWB. Gait Pattern: Step-to pattern Stairs: No     Exercises     PT Diagnosis:    PT Problem List:   PT Treatment Interventions:     PT Goals Acute Rehab PT Goals PT Goal: Sit to Stand - Progress: Progressing toward goal PT Goal: Stand to Sit - Progress: Progressing toward goal PT Goal: Ambulate - Progress: Progressing toward goal  Visit Information  Last PT Received On: 01/04/13 Assistance Needed: +1    Subjective Data      Cognition  Cognition Overall Cognitive Status: History of cognitive impairments - at baseline Arousal/Alertness: Awake/alert Orientation Level: Appears intact for tasks assessed Behavior During Session: Anxious    Balance     End of Session PT - End of Session Equipment Utilized During Treatment: Gait belt Activity Tolerance: Patient tolerated treatment well Patient left: in chair;with call bell/phone within reach   GP     [facility], Rumi Kolodziej JEAN SPTA 01/04/2013, 1:51 PM

## 2013-01-04 NOTE — Discharge Summary (Signed)
Physician Discharge Summary  Patient ID: Harold Bailey MRN: [id number] DOB/AGE: October 29, 1965 47 y.o.  Admit date: 01/01/2013 Discharge date: 01/04/2013  Admission Diagnoses: Osteomyelitis left ankle  Discharge Diagnoses: Ostomy myelitis left ankle Active Problems:   * No active hospital problems. *   Discharged Condition: stable  Hospital Course: Patient's hospital course was essentially remarkable. He underwent surgical debridement and placement of antibiotic beads of the left ankle. Cultures were positive for staph aureus and patient will be discharged on IV vancomycin for 6 weeks.  Consults: None  Significant Diagnostic Studies: labs: Routine labs and placement of PICC line  Treatments: antibiotics: vancomycin and Zosyn and surgery: See operative note  Discharge Exam: Blood pressure 124/71, pulse 67, temperature 97.6 F (36.4 C), temperature source Oral, resp. rate 18, SpO2 97.00%. Incision/Wound: dressing clean dry and intact  Disposition: 03-Skilled Nursing Facility  Discharge Orders   Future Orders Complete By Expires     Call MD / Call 911  As directed     Comments:      If you experience chest pain or shortness of breath, CALL 911 and be transported to the hospital emergency room.  If you develope a fever above 101 F, pus (white drainage) or increased drainage or redness at the wound, or calf pain, call your surgeon's office.    Constipation Prevention  As directed     Comments:      Drink plenty of fluids.  Prune juice may be helpful.  You may use a stool softener, such as Colace (over the counter) 100 mg twice a day.  Use MiraLax (over the counter) for constipation as needed.    Diet - low sodium heart healthy  As directed     Increase activity slowly as tolerated  As directed     Non weight bearing  As directed     Comments:      Nonweightbearing left lower extremity. Patient to wear a fracture boot while ambulating.        Medication List    STOP taking  these medications       doxycycline 100 MG EC tablet  Commonly known as:  DORYX      TAKE these medications       ALFALFA PO  Take 4 capsules by mouth daily.     ALPRAZolam 1 MG tablet  Commonly known as:  XANAX  Take 1-2 mg by mouth at bedtime as needed for sleep or anxiety.     bisoprolol-hydrochlorothiazide 10-6.25 MG per tablet  Commonly known as:  ZIAC  Take 1 tablet by mouth daily.     celecoxib 200 MG capsule  Commonly known as:  CELEBREX  Take 200 mg by mouth daily.     diclofenac sodium 1 % Gel  Commonly known as:  VOLTAREN  Apply 2 g topically 4 (four) times daily.     divalproex 500 MG 24 hr tablet  Commonly known as:  DEPAKOTE ER  Take 500 mg by mouth daily.     fish oil-omega-3 fatty acids 1000 MG capsule  Take 1,000 mg by mouth daily.     HYDROcodone-acetaminophen 10-325 MG per tablet  Commonly known as:  NORCO  Take 1 tablet by mouth 2 (two) times daily.     moxifloxacin 400 MG tablet  Commonly known as:  AVELOX  Take 400 mg by mouth daily. X 10 days. Last dose and when pt started unknown     multivitamin with minerals Tabs  Take 1 tablet by  mouth daily.     nicotine 21 mg/24hr patch  Commonly known as:  NICODERM CQ - dosed in mg/24 hours  Place 1 patch onto the skin daily.     oxyCODONE-acetaminophen 5-325 MG per tablet  Commonly known as:  ROXICET  Take 1 tablet by mouth every 4 (four) hours as needed for pain.     oxyCODONE-acetaminophen 5-325 MG per tablet  Commonly known as:  PERCOCET/ROXICET  Take 1 tablet by mouth every 4 (four) hours as needed for pain.     sertraline 50 MG tablet  Commonly known as:  ZOLOFT  Take 50 mg by mouth daily.     sodium chloride 0.9 % SOLN 250 mL with vancomycin 10 G SOLR 1,250 mg  Inject 1,250 mg into the vein every 8 (eight) hours.     topiramate 15 MG capsule  Commonly known as:  TOPAMAX  Take 15 mg by mouth 2 (two) times daily.           Follow-up Information   Follow up with Khanh Cordner V,  MD In 2 weeks.   Contact information:   [address] [address] [place] [address] [phone]       Signed: Nadara Mustard 01/04/2013, 6:41 AM

## 2013-01-04 NOTE — Progress Notes (Signed)
Seen and agreed 01/04/2013 Robinette, Julia Elizabeth PTA [phone] pager [phone] office    

## 2013-01-05 ENCOUNTER — Encounter (HOSPITAL_COMMUNITY): Payer: Self-pay | Admitting: Orthopedic Surgery

## 2013-01-05 LAB — PROTIME-INR
INR: 1.34 (ref 0.00–1.49)
Prothrombin Time: 16.3 seconds — ABNORMAL HIGH (ref 11.6–15.2)

## 2013-01-05 LAB — GLUCOSE, CAPILLARY: Glucose-Capillary: 81 mg/dL (ref 70–99)

## 2013-01-05 MED ORDER — HEPARIN SOD (PORK) LOCK FLUSH 100 UNIT/ML IV SOLN
250.0000 [IU] | Freq: Every day | INTRAVENOUS | Status: DC
  Filled 2013-01-05: qty 3

## 2013-01-05 MED ORDER — HEPARIN SOD (PORK) LOCK FLUSH 100 UNIT/ML IV SOLN
250.0000 [IU] | INTRAVENOUS | Status: DC | PRN
  Administered 2013-01-05: 250 [IU]
  Filled 2013-01-05: qty 3

## 2013-01-05 NOTE — Clinical Social Work Placement (Signed)
Clinical Social Work Department  CLINICAL SOCIAL WORK PLACEMENT NOTE  4.14.14  Patient: Harold Bailey  Account Number: [id number] Admit date: 01/01/13 Clinical Social Worker: Sabino Niemann LCSWA Date/time: 08/30/2012 11:30 AM  Clinical Social Work is seeking post-discharge placement for this patient at the following level of care: SKILLED NURSING (*CSW will update this form in Epic as items are completed)  01/04/13 Patient/family provided with [facility] Department of Clinical Social Work's list of facilities offering this level of care within the geographic area requested by the patient (or if unable, by the patient's family).  01/04/13 Patient/family informed of their freedom to choose among providers that offer the needed level of care, that participate in Medicare, Medicaid or managed care program needed by the patient, have an available bed and are willing to accept the patient.  01/04/13 Patient/family informed of [facility]' ownership interest in [facility], as well as of the fact that they are under no obligation to receive care at this facility.  PASARR submitted to EDS on 01/04/13 PASARR number received from EDS on 01/04/13 FL2 transmitted to all facilities in geographic area requested by pt/family on 01/04/13 FL2 transmitted to all facilities within larger geographic area on  Patient informed that his/her managed care company has contracts with or will negotiate with certain facilities, including the following:  Patient/family informed of bed offers received: 01/04/13 Patient chooses bed at [facility] Physician recommends and patient chooses bed at  Patient to be transferred to on 01/05/2013 Patient to be transferred to facility by [facility] The following physician request were entered in Epic:  Additional Comments:

## 2013-01-05 NOTE — Progress Notes (Addendum)
Patient ID: Harold Bailey, male   DOB: 1965/10/08, 47 y.o.   MRN: [id number] Plan for discharge to skilled nursing facility today. Patient is comfortable. I will followup in the office in one week. Nonweightbearing left lower extremity. Continue IV vancomycin. Culture positive for MRSA

## 2013-01-06 LAB — ANAEROBIC CULTURE

## 2013-01-26 ENCOUNTER — Other Ambulatory Visit: Payer: Self-pay | Admitting: Orthopedic Surgery

## 2013-01-28 ENCOUNTER — Encounter (HOSPITAL_COMMUNITY): Payer: Self-pay | Admitting: Pharmacy Technician

## 2013-01-29 ENCOUNTER — Encounter (HOSPITAL_COMMUNITY): Payer: Self-pay | Admitting: *Deleted

## 2013-01-31 MED ORDER — CEFAZOLIN SODIUM-DEXTROSE 2-3 GM-% IV SOLR
2.0000 g | INTRAVENOUS | Status: AC
  Administered 2013-02-01: 2 g via INTRAVENOUS
  Filled 2013-01-31: qty 50

## 2013-02-01 ENCOUNTER — Encounter (HOSPITAL_COMMUNITY): Payer: Self-pay | Admitting: Anesthesiology

## 2013-02-01 ENCOUNTER — Ambulatory Visit (HOSPITAL_COMMUNITY): Payer: Medicare Other | Admitting: Anesthesiology

## 2013-02-01 ENCOUNTER — Inpatient Hospital Stay (HOSPITAL_COMMUNITY)
Admission: AD | Admit: 2013-02-01 | Discharge: 2013-02-05 | DRG: 513 | Disposition: A | Discharge status: Skilled Nursing Facility | Payer: Medicare Other | Source: Ambulatory Visit | Attending: Orthopedic Surgery | Admitting: Orthopedic Surgery

## 2013-02-01 ENCOUNTER — Ambulatory Visit (HOSPITAL_COMMUNITY)
Admission: RE | Admit: 2013-02-01 | Discharge: 2013-02-01 | Disposition: A | Discharge status: Still In Hospital | Payer: Medicare Other | Source: Ambulatory Visit | Attending: Orthopedic Surgery | Admitting: Orthopedic Surgery

## 2013-02-01 ENCOUNTER — Encounter (HOSPITAL_COMMUNITY)
Admission: RE | Disposition: A | Discharge status: Still In Hospital | Payer: Self-pay | Source: Ambulatory Visit | Attending: Orthopedic Surgery

## 2013-02-01 DIAGNOSIS — F17200 Nicotine dependence, unspecified, uncomplicated: Secondary | ICD-10-CM | POA: Diagnosis present

## 2013-02-01 DIAGNOSIS — M19079 Primary osteoarthritis, unspecified ankle and foot: Secondary | ICD-10-CM | POA: Diagnosis present

## 2013-02-01 DIAGNOSIS — W1789XA Other fall from one level to another, initial encounter: Secondary | ICD-10-CM | POA: Diagnosis present

## 2013-02-01 DIAGNOSIS — S62009A Unspecified fracture of navicular [scaphoid] bone of unspecified wrist, initial encounter for closed fracture: Principal | ICD-10-CM | POA: Diagnosis present

## 2013-02-01 DIAGNOSIS — F411 Generalized anxiety disorder: Secondary | ICD-10-CM | POA: Diagnosis present

## 2013-02-01 DIAGNOSIS — K219 Gastro-esophageal reflux disease without esophagitis: Secondary | ICD-10-CM | POA: Diagnosis present

## 2013-02-01 DIAGNOSIS — A4902 Methicillin resistant Staphylococcus aureus infection, unspecified site: Secondary | ICD-10-CM | POA: Diagnosis present

## 2013-02-01 DIAGNOSIS — S62001A Unspecified fracture of navicular [scaphoid] bone of right wrist, initial encounter for closed fracture: Secondary | ICD-10-CM

## 2013-02-01 DIAGNOSIS — Z91199 Patient's noncompliance with other medical treatment and regimen due to unspecified reason: Secondary | ICD-10-CM

## 2013-02-01 DIAGNOSIS — S92253A Displaced fracture of navicular [scaphoid] of unspecified foot, initial encounter for closed fracture: Secondary | ICD-10-CM | POA: Diagnosis present

## 2013-02-01 DIAGNOSIS — I10 Essential (primary) hypertension: Secondary | ICD-10-CM | POA: Diagnosis present

## 2013-02-01 DIAGNOSIS — L089 Local infection of the skin and subcutaneous tissue, unspecified: Secondary | ICD-10-CM | POA: Diagnosis present

## 2013-02-01 DIAGNOSIS — Y92009 Unspecified place in unspecified non-institutional (private) residence as the place of occurrence of the external cause: Secondary | ICD-10-CM

## 2013-02-01 DIAGNOSIS — T8140XA Infection following a procedure, unspecified, initial encounter: Secondary | ICD-10-CM | POA: Diagnosis present

## 2013-02-01 DIAGNOSIS — Y849 Medical procedure, unspecified as the cause of abnormal reaction of the patient, or of later complication, without mention of misadventure at the time of the procedure: Secondary | ICD-10-CM | POA: Diagnosis present

## 2013-02-01 DIAGNOSIS — Z981 Arthrodesis status: Secondary | ICD-10-CM

## 2013-02-01 DIAGNOSIS — R269 Unspecified abnormalities of gait and mobility: Secondary | ICD-10-CM | POA: Diagnosis present

## 2013-02-01 DIAGNOSIS — Z9119 Patient's noncompliance with other medical treatment and regimen: Secondary | ICD-10-CM

## 2013-02-01 DIAGNOSIS — R296 Repeated falls: Secondary | ICD-10-CM | POA: Diagnosis present

## 2013-02-01 DIAGNOSIS — S62001S Unspecified fracture of navicular [scaphoid] bone of right wrist, sequela: Secondary | ICD-10-CM

## 2013-02-01 HISTORY — PX: OPEN REDUCTION INTERNAL FIXATION (ORIF) SCAPHOID WITH DISTAL RADIUS GRAFT: SHX5667

## 2013-02-01 HISTORY — DX: Other localized visual field defect, unspecified eye: H53.459

## 2013-02-01 HISTORY — DX: Other amnesia: R41.3

## 2013-02-01 LAB — BASIC METABOLIC PANEL
BUN: 11 mg/dL (ref 6–23)
CO2: 23 mEq/L (ref 19–32)
Chloride: 108 mEq/L (ref 96–112)
GFR calc non Af Amer: >90 mL/min (ref >90)
Glucose, Bld: 101 mg/dL — ABNORMAL HIGH (ref 70–99)
Potassium: 3.6 mEq/L (ref 3.5–5.1)
Sodium: 140 mEq/L (ref 135–145)

## 2013-02-01 LAB — CBC
HCT: 46.4 % (ref 39.0–52.0)
Hemoglobin: 16.4 g/dL (ref 13.0–17.0)
MCH: 31.2 pg (ref 26.0–34.0)
MCHC: 35.3 g/dL (ref 30.0–36.0)
RBC: 5.26 MIL/uL (ref 4.22–5.81)

## 2013-02-01 LAB — SURGICAL PCR SCREEN
MRSA, PCR: NEGATIVE
Staphylococcus aureus: NEGATIVE

## 2013-02-01 SURGERY — OPEN REDUCTION INTERNAL FIXATION (ORIF) SCAPHOID WITH DISTAL RADIUS GRAFT
Anesthesia: Regional | Site: Hand | Laterality: Right | Wound class: Clean

## 2013-02-01 MED ORDER — LIDOCAINE HCL (CARDIAC) 20 MG/ML IV SOLN
INTRAVENOUS | Status: DC | PRN
  Administered 2013-02-01: 50 mg via INTRAVENOUS

## 2013-02-01 MED ORDER — WARFARIN VIDEO: Freq: Once | Status: DC

## 2013-02-01 MED ORDER — PROPOFOL 10 MG/ML IV BOLUS
INTRAVENOUS | Status: DC | PRN
  Administered 2013-02-01: 200 mg via INTRAVENOUS

## 2013-02-01 MED ORDER — OXYCODONE HCL 5 MG PO TABS
ORAL_TABLET | ORAL | Status: AC
  Filled 2013-02-01: qty 1

## 2013-02-01 MED ORDER — VANCOMYCIN HCL IN DEXTROSE 1-5 GM/200ML-% IV SOLN: 1000.0000 mg | Freq: Two times a day (BID) | INTRAVENOUS | Status: DC

## 2013-02-01 MED ORDER — VANCOMYCIN HCL 10 G IV SOLR
1250.0000 mg | Freq: Two times a day (BID) | INTRAVENOUS | Status: DC
  Administered 2013-02-01 – 2013-02-02 (×2): 1250 mg via INTRAVENOUS
  Filled 2013-02-01 (×4): qty 1250

## 2013-02-01 MED ORDER — LACTATED RINGERS IV SOLN
INTRAVENOUS | Status: DC | PRN
  Administered 2013-02-01: 13:00:00 via INTRAVENOUS

## 2013-02-01 MED ORDER — TOPIRAMATE 15 MG PO CPSP
15.0000 mg | ORAL_CAPSULE | Freq: Two times a day (BID) | ORAL | Status: DC
  Administered 2013-02-03 – 2013-02-05 (×5): 15 mg via ORAL

## 2013-02-01 MED ORDER — WARFARIN - PHARMACIST DOSING INPATIENT: Freq: Every day | Status: DC

## 2013-02-01 MED ORDER — DIVALPROEX SODIUM ER 500 MG PO TB24
500.0000 mg | ORAL_TABLET | Freq: Every day | ORAL | Status: DC
  Administered 2013-02-02 – 2013-02-05 (×4): 500 mg via ORAL
  Filled 2013-02-01 (×4): qty 1

## 2013-02-01 MED ORDER — MEPERIDINE HCL 25 MG/ML IJ SOLN: 6.2500 mg | INTRAMUSCULAR | Status: DC | PRN

## 2013-02-01 MED ORDER — LACTATED RINGERS IV SOLN
INTRAVENOUS | Status: DC
  Administered 2013-02-01: 13:00:00 via INTRAVENOUS

## 2013-02-01 MED ORDER — ONDANSETRON HCL 4 MG/2ML IJ SOLN
INTRAMUSCULAR | Status: DC | PRN
  Administered 2013-02-01: 4 mg via INTRAVENOUS

## 2013-02-01 MED ORDER — METOCLOPRAMIDE HCL 10 MG PO TABS: 5.0000 mg | ORAL_TABLET | Freq: Three times a day (TID) | ORAL | Status: DC | PRN

## 2013-02-01 MED ORDER — ALPRAZOLAM 0.5 MG PO TABS
1.0000 mg | ORAL_TABLET | Freq: Every evening | ORAL | Status: DC | PRN
  Administered 2013-02-01 – 2013-02-04 (×4): 2 mg via ORAL
  Filled 2013-02-01 (×4): qty 4

## 2013-02-01 MED ORDER — CHLORHEXIDINE GLUCONATE 4 % EX LIQD: 60.0000 mL | Freq: Once | CUTANEOUS | Status: DC

## 2013-02-01 MED ORDER — ONDANSETRON HCL 4 MG/2ML IJ SOLN: 4.0000 mg | Freq: Once | INTRAMUSCULAR | Status: DC | PRN

## 2013-02-01 MED ORDER — FENTANYL CITRATE 0.05 MG/ML IJ SOLN
INTRAMUSCULAR | Status: AC
  Filled 2013-02-01: qty 2

## 2013-02-01 MED ORDER — OXYCODONE HCL 5 MG/5ML PO SOLN: 5.0000 mg | Freq: Once | ORAL | Status: AC | PRN

## 2013-02-01 MED ORDER — MUPIROCIN 2 % EX OINT: TOPICAL_OINTMENT | Freq: Two times a day (BID) | CUTANEOUS | Status: DC

## 2013-02-01 MED ORDER — FLUTICASONE PROPIONATE 50 MCG/ACT NA SUSP
2.0000 | Freq: Every day | NASAL | Status: DC | PRN
  Filled 2013-02-01: qty 16

## 2013-02-01 MED ORDER — 0.9 % SODIUM CHLORIDE (POUR BTL) OPTIME
TOPICAL | Status: DC | PRN
  Administered 2013-02-01: 1000 mL

## 2013-02-01 MED ORDER — HYDROCODONE-ACETAMINOPHEN 10-325 MG PO TABS
1.0000 | ORAL_TABLET | ORAL | Status: DC | PRN
  Administered 2013-02-01 – 2013-02-04 (×10): 2 via ORAL
  Filled 2013-02-01 (×10): qty 2

## 2013-02-01 MED ORDER — COUMADIN BOOK
Freq: Once | Status: AC
  Administered 2013-02-01: 22:00:00
  Filled 2013-02-01: qty 1

## 2013-02-01 MED ORDER — HYDROMORPHONE HCL PF 1 MG/ML IJ SOLN: 0.2500 mg | INTRAMUSCULAR | Status: DC | PRN

## 2013-02-01 MED ORDER — ROCURONIUM BROMIDE 100 MG/10ML IV SOLN
INTRAVENOUS | Status: DC | PRN
  Administered 2013-02-01: 50 mg via INTRAVENOUS

## 2013-02-01 MED ORDER — NICOTINE 21 MG/24HR TD PT24
21.0000 mg | MEDICATED_PATCH | TRANSDERMAL | Status: DC
  Administered 2013-02-01 – 2013-02-04 (×4): 21 mg via TRANSDERMAL
  Filled 2013-02-01 (×5): qty 1

## 2013-02-01 MED ORDER — BUPIVACAINE-EPINEPHRINE PF 0.5-1:200000 % IJ SOLN
INTRAMUSCULAR | Status: DC | PRN
  Administered 2013-02-01: 30 mL

## 2013-02-01 MED ORDER — HEPARIN SOD (PORK) LOCK FLUSH 100 UNIT/ML IV SOLN
250.0000 [IU] | INTRAVENOUS | Status: AC | PRN
  Administered 2013-02-01: 250 [IU]

## 2013-02-01 MED ORDER — MIDAZOLAM HCL 5 MG/ML IJ SOLN: 2.0000 mg | Freq: Once | INTRAMUSCULAR | Status: DC

## 2013-02-01 MED ORDER — OXYCODONE-ACETAMINOPHEN 5-325 MG PO TABS
1.0000 | ORAL_TABLET | ORAL | Status: DC | PRN
  Administered 2013-02-04 (×4): 2 via ORAL
  Administered 2013-02-04: 1 via ORAL
  Administered 2013-02-05 (×3): 2 via ORAL
  Filled 2013-02-01 (×8): qty 2

## 2013-02-01 MED ORDER — MUPIROCIN 2 % EX OINT
TOPICAL_OINTMENT | CUTANEOUS | Status: AC
  Administered 2013-02-01: 1 via NASAL
  Filled 2013-02-01: qty 22

## 2013-02-01 MED ORDER — MIDAZOLAM HCL 2 MG/2ML IJ SOLN
INTRAMUSCULAR | Status: AC
  Administered 2013-02-01: 2 mg
  Filled 2013-02-01: qty 2

## 2013-02-01 MED ORDER — OXYCODONE HCL 5 MG PO TABS
5.0000 mg | ORAL_TABLET | Freq: Once | ORAL | Status: AC | PRN
  Administered 2013-02-01: 5 mg via ORAL

## 2013-02-01 MED ORDER — FENTANYL CITRATE 0.05 MG/ML IJ SOLN
INTRAMUSCULAR | Status: DC | PRN
  Administered 2013-02-01: 50 ug via INTRAVENOUS
  Administered 2013-02-01: 100 ug via INTRAVENOUS
  Administered 2013-02-01 (×2): 50 ug via INTRAVENOUS

## 2013-02-01 MED ORDER — SODIUM CHLORIDE 0.9 % IJ SOLN
10.0000 mL | INTRAMUSCULAR | Status: DC | PRN
  Administered 2013-02-01: 10 mL

## 2013-02-01 MED ORDER — VANCOMYCIN HCL 10 G IV SOLR
1250.0000 mg | Freq: Three times a day (TID) | INTRAVENOUS | Status: DC
  Filled 2013-02-01 (×25): qty 1250

## 2013-02-01 MED ORDER — FENTANYL CITRATE 0.05 MG/ML IJ SOLN
100.0000 ug | Freq: Once | INTRAMUSCULAR | Status: AC
  Administered 2013-02-01: 100 ug via INTRAVENOUS

## 2013-02-01 MED ORDER — GLYCOPYRROLATE 0.2 MG/ML IJ SOLN
INTRAMUSCULAR | Status: DC | PRN
  Administered 2013-02-01: .6 mg via INTRAVENOUS

## 2013-02-01 MED ORDER — ONDANSETRON HCL 4 MG PO TABS: 4.0000 mg | ORAL_TABLET | Freq: Four times a day (QID) | ORAL | Status: DC | PRN

## 2013-02-01 MED ORDER — HYDROMORPHONE HCL PF 1 MG/ML IJ SOLN: 0.5000 mg | INTRAMUSCULAR | Status: DC | PRN

## 2013-02-01 MED ORDER — BISOPROLOL-HYDROCHLOROTHIAZIDE 10-6.25 MG PO TABS
1.0000 | ORAL_TABLET | Freq: Every day | ORAL | Status: DC
  Administered 2013-02-02 – 2013-02-05 (×4): 1 via ORAL
  Filled 2013-02-01 (×4): qty 1

## 2013-02-01 MED ORDER — SERTRALINE HCL 50 MG PO TABS
50.0000 mg | ORAL_TABLET | Freq: Every day | ORAL | Status: DC
  Administered 2013-02-02 – 2013-02-05 (×4): 50 mg via ORAL
  Filled 2013-02-01 (×4): qty 1

## 2013-02-01 MED ORDER — ONDANSETRON HCL 4 MG/2ML IJ SOLN: 4.0000 mg | Freq: Four times a day (QID) | INTRAMUSCULAR | Status: DC | PRN

## 2013-02-01 MED ORDER — TOPIRAMATE 15 MG PO CPSP: 15.0000 mg | ORAL_CAPSULE | Freq: Two times a day (BID) | ORAL | Status: DC

## 2013-02-01 MED ORDER — OXYCODONE-ACETAMINOPHEN 5-325 MG PO TABS: 1.0000 | ORAL_TABLET | ORAL | Status: DC | PRN

## 2013-02-01 MED ORDER — NEOSTIGMINE METHYLSULFATE 1 MG/ML IJ SOLN
INTRAMUSCULAR | Status: DC | PRN
  Administered 2013-02-01: 4 mg via INTRAVENOUS

## 2013-02-01 MED ORDER — METOCLOPRAMIDE HCL 5 MG/ML IJ SOLN: 5.0000 mg | Freq: Three times a day (TID) | INTRAMUSCULAR | Status: DC | PRN

## 2013-02-01 MED ORDER — SODIUM CHLORIDE 0.9 % IV SOLN
INTRAVENOUS | Status: DC
  Administered 2013-02-02: 10:00:00 via INTRAVENOUS

## 2013-02-01 MED ORDER — WARFARIN SODIUM 10 MG PO TABS
10.0000 mg | ORAL_TABLET | Freq: Once | ORAL | Status: AC
  Administered 2013-02-01: 10 mg via ORAL
  Filled 2013-02-01: qty 1

## 2013-02-01 SURGICAL SUPPLY — 51 items
BANDAGE ELASTIC 3 VELCRO ST LF (GAUZE/BANDAGES/DRESSINGS) ×2 IMPLANT
BANDAGE ELASTIC 4 VELCRO ST LF (GAUZE/BANDAGES/DRESSINGS) ×2 IMPLANT
BANDAGE GAUZE ELAST BULKY 4 IN (GAUZE/BANDAGES/DRESSINGS) ×3 IMPLANT
BIT DRILL 3.2 CANN STRGHT (BIT) ×1 IMPLANT
BNDG CMPR 9X4 STRL LF SNTH (GAUZE/BANDAGES/DRESSINGS) ×1
BNDG ESMARK 4X9 LF (GAUZE/BANDAGES/DRESSINGS) ×2 IMPLANT
CLOTH BEACON ORANGE TIMEOUT ST (SAFETY) ×2 IMPLANT
CORDS BIPOLAR (ELECTRODE) IMPLANT
COVER SURGICAL LIGHT HANDLE (MISCELLANEOUS) ×2 IMPLANT
CUFF TOURNIQUET SINGLE 18IN (TOURNIQUET CUFF) ×2 IMPLANT
DRAPE OEC MINIVIEW 54X84 (DRAPES) IMPLANT
DRAPE SURG 17X23 STRL (DRAPES) ×2 IMPLANT
DRILL ENDOSCOPIC QF COMPR FOOT (DRILL) IMPLANT
DRILL PROFILE (DRILL) ×2
DURAPREP 26ML APPLICATOR (WOUND CARE) ×2 IMPLANT
ELECT REM PT RETURN 9FT ADLT (ELECTROSURGICAL)
ELECTRODE REM PT RTRN 9FT ADLT (ELECTROSURGICAL) IMPLANT
GAUZE XEROFORM 1X8 LF (GAUZE/BANDAGES/DRESSINGS) ×2 IMPLANT
GLOVE BIO SURGEON STRL SZ8.5 (GLOVE) ×2 IMPLANT
GOWN SRG XL XLNG 56XLVL 4 (GOWN DISPOSABLE) ×1 IMPLANT
GOWN STRL NON-REIN LRG LVL3 (GOWN DISPOSABLE) ×2 IMPLANT
GOWN STRL NON-REIN XL XLG LVL4 (GOWN DISPOSABLE) ×2
GUIDEWIRE .045XTROC TIP LSR LN (WIRE) IMPLANT
K-WIRE 1.1 (WIRE) ×2
KIT BASIN OR (CUSTOM PROCEDURE TRAY) ×2 IMPLANT
KIT ROOM TURNOVER OR (KITS) ×2 IMPLANT
MANIFOLD NEPTUNE II (INSTRUMENTS) ×2 IMPLANT
NDL HYPO 25GX1X1/2 BEV (NEEDLE) IMPLANT
NEEDLE HYPO 25GX1X1/2 BEV (NEEDLE) IMPLANT
NS IRRIG 1000ML POUR BTL (IV SOLUTION) ×2 IMPLANT
PACK ORTHO EXTREMITY (CUSTOM PROCEDURE TRAY) ×2 IMPLANT
PAD ARMBOARD 7.5X6 YLW CONV (MISCELLANEOUS) ×4 IMPLANT
PAD CAST 3X4 CTTN HI CHSV (CAST SUPPLIES) ×1 IMPLANT
PAD CAST 4YDX4 CTTN HI CHSV (CAST SUPPLIES) ×1 IMPLANT
PADDING CAST COTTON 3X4 STRL (CAST SUPPLIES) ×2
PADDING CAST COTTON 4X4 STRL (CAST SUPPLIES) ×2
PENCIL BUTTON HOLSTER BLD 10FT (ELECTRODE) IMPLANT
SCREW COMPRESSION 4.0X20 (Screw) ×1 IMPLANT
SPLINT PLASTER CAST XFAST 4X15 (CAST SUPPLIES) IMPLANT
SPLINT PLASTER XTRA FAST SET 4 (CAST SUPPLIES) ×1
SPONGE GAUZE 4X4 12PLY (GAUZE/BANDAGES/DRESSINGS) ×2 IMPLANT
SPONGE LAP 4X18 X RAY DECT (DISPOSABLE) IMPLANT
STRIP CLOSURE SKIN 1/2X4 (GAUZE/BANDAGES/DRESSINGS) IMPLANT
SUT PROLENE 3 0 PS 2 (SUTURE) IMPLANT
SUT VICRYL 4-0 PS2 18IN ABS (SUTURE) IMPLANT
SYR CONTROL 10ML LL (SYRINGE) IMPLANT
TOWEL OR 17X24 6PK STRL BLUE (TOWEL DISPOSABLE) ×2 IMPLANT
TOWEL OR 17X26 10 PK STRL BLUE (TOWEL DISPOSABLE) ×2 IMPLANT
TUBE CONNECTING 12X1/4 (SUCTIONS) IMPLANT
UNDERPAD 30X30 INCONTINENT (UNDERPADS AND DIAPERS) ×2 IMPLANT
WATER STERILE IRR 1000ML POUR (IV SOLUTION) ×2 IMPLANT

## 2013-02-01 NOTE — Anesthesia Procedure Notes (Addendum)
Anesthesia Regional Block:  Supraclavicular block  Pre-Anesthetic Checklist: ,, timeout performed, Correct Patient, Correct Site, Correct Laterality, Correct Procedure, Correct Position, site marked, Risks and benefits discussed,  Surgical consent,  Pre-op evaluation,  At surgeon's request and post-op pain management  Laterality: Right  Prep: chloraprep       Needles:  Injection technique: Single-shot  Needle Type: Echogenic Stimulator Needle     Needle Length: 5cm 5 cm     Additional Needles:  Procedures: ultrasound guided (picture in chart) and nerve stimulator Supraclavicular block  Nerve Stimulator or Paresthesia:  Response: 0.4 mA,   Additional Responses:   Narrative:  Start time: 02/01/2013 1:05 PM End time: 02/01/2013 1:20 PM Injection made incrementally with aspirations every 5 mL.  Performed by: Personally  Anesthesiologist: Arta Bruce MD  Additional Notes: Monitors applied. Patient sedated. Sterile prep and drape,hand hygiene and sterile gloves were used. Relevant anatomy identified.Needle position confirmed.Local anesthetic injected incrementally after negative aspiration. Local anesthetic spread visualized around nerve(s). Vascular puncture avoided. No complications. Image printed for medical record.The patient tolerated the procedure well.       Supraclavicular block Procedure Name: Intubation Date/Time: 02/01/2013 1:34 PM Performed by: Angelica Pou Pre-anesthesia Checklist: Patient identified, Timeout performed, Emergency Drugs available, Suction available and Patient being monitored Patient Re-evaluated:Patient Re-evaluated prior to inductionOxygen Delivery Method: Circle system utilized Preoxygenation: Pre-oxygenation with 100% oxygen Intubation Type: IV induction Ventilation: Mask ventilation without difficulty and Oral airway inserted - appropriate to patient size Laryngoscope Size: Mac and 3 Grade View: Grade I Tube type: Oral Tube  size: 7.5 mm Number of attempts: 1 Airway Equipment and Method: Stylet and Oral airway Placement Confirmation: ETT inserted through vocal cords under direct vision,  breath sounds checked- equal and bilateral and positive ETCO2 Secured at: 23 cm Tube secured with: Tape Dental Injury: Teeth and Oropharynx as per pre-operative assessment

## 2013-02-01 NOTE — Progress Notes (Signed)
Patient ID: Harold Bailey, male   DOB: Sep 10, 1966, 47 y.o.   MRN: [id number] Patient requires discharge to SNF due to the fact he is non weight bearing through his right wrist and non weight bearing left ankle.  Patient has been discharged to home after ankle fusion and sustained the scaphoid fracture due to being unstable with walker and crutches.  Patients wife states she can not care for him 24 hours per day.

## 2013-02-01 NOTE — Progress Notes (Signed)
IV therapy paged to dc fluids from PICC in ssc

## 2013-02-01 NOTE — Transfer of Care (Signed)
Immediate Anesthesia Transfer of Care Note  Patient: Harold Bailey  Procedure(s) Performed: Procedure(s): OPEN REDUCTION INTERNAL FIXATION (ORIF) RIGHT SCAPHOID FRACTURE WITH DISTAL RADIUS GRAFT (Right)  Patient Location: PACU  Anesthesia Type:General  Level of Consciousness: awake, alert , oriented and patient cooperative  Airway & Oxygen Therapy: Patient Spontanous Breathing and Patient connected to nasal cannula oxygen  Post-op Assessment: Report given to PACU RN and Post -op Vital signs reviewed and stable  Post vital signs: Reviewed and stable  Complications: No apparent anesthesia complications

## 2013-02-01 NOTE — Progress Notes (Signed)
ANTIBIOTIC CONSULT NOTE - INITIAL  Pharmacy Consult for Vancomycin, Coumadin Indication: Vancomycin/osteomyelitis    Coumadin/VTE prophylaxis  No Known Allergies  Patient Measurements:   Adjusted Body Weight:   Vital Signs: Temp: 97 F (36.1 C) (05/12 1526) BP: 128/68 mmHg (05/12 1526) Pulse Rate: 50 (05/12 1526) Intake/Output from previous day:   Intake/Output from this shift:    Labs:  Recent Labs  02/01/13 1140  WBC 8.8  HGB 16.4  PLT 136*  CREATININE 0.77   The CrCl is unknown because both a height and weight (above a minimum accepted value) are required for this calculation. No results found for this basename: VANCOTROUGH, Leodis Binet, VANCORANDOM, GENTTROUGH, GENTPEAK, GENTRANDOM, TOBRATROUGH, TOBRAPEAK, TOBRARND, AMIKACINPEAK, AMIKACINTROU, AMIKACIN,  in the last 72 hours   Microbiology: Recent Results (from the past 720 hour(s))  SURGICAL PCR SCREEN     Status: None   Collection Time    02/01/13 11:40 AM      Result Value Range Status   MRSA, PCR NEGATIVE  NEGATIVE Final   Staphylococcus aureus NEGATIVE  NEGATIVE Final   Comment:            The Xpert SA Assay (FDA     approved for NASAL specimens     in patients over 73 years of age),     is one component of     a comprehensive surveillance     program.  Test performance has     been validated by [employer] for patients greater     than or equal to 80 year old.     It is not intended     to diagnose infection nor to     guide or monitor treatment.    Medical History: Past Medical History  Diagnosis Date  . Hypertension   . Shortness of breath     Hx of smoking.  Marland Kitchen GERD (gastroesophageal reflux disease)   . 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  . Arthritis     Left ankle  . Polycythemia   . Anxiety   . 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 [START ON 02/02/2013] bisoprolol-hydrochlorothiazide  1 tablet Oral Daily  . coumadin book   Does not apply Once  . [START ON 02/02/2013] divalproex  500 mg Oral Daily  . nicotine  21 mg Transdermal Q24H  . [START ON 02/02/2013] sertraline  50 mg Oral Daily  . topiramate  15 mg Oral BID  . vancomycin  1,250 mg Intravenous Q12H  . warfarin  10 mg Oral Once  . [START ON 02/02/2013] warfarin   Does not apply Once  . [START ON 02/02/2013] Warfarin - Pharmacist Dosing Inpatient   Does not apply q1800  . [DISCONTINUED] topiramate  15 mg Oral BID  . [DISCONTINUED] vancomycin  1,250 mg Intravenous Q8H  . [DISCONTINUED] vancomycin  1,000 mg Intravenous Q12H   Assessment: Pt had an ankle fusion done on 3/18. In April he developed a MRSA infection and started treatment with vancomycin. Today, pt had surgery for ORIF of right scaphoid.  Goal of Therapy:  Vancomycin trough level 15-20 mcg/ml INR 2.0-3.0 Plan:  Will continue pt's home dose of Vancomycin 1250 mg IV q12 hrs.Levels when appropriate. Coumadin 10 mg today. Daily INR. Ordered booklet  and video.  Eugene Garnet 02/01/2013,7:00 PM

## 2013-02-01 NOTE — Anesthesia Preprocedure Evaluation (Addendum)
Anesthesia Evaluation  Patient identified by MRN, date of birth, ID band Patient awake    Reviewed: Allergy & Precautions, H&P , Patient's Chart, lab work & pertinent test results  Airway Mallampati: II TM Distance: >3 FB Neck ROM: Full    Dental   Pulmonary shortness of breath and with exertion,          Cardiovascular hypertension, Pt. on medications     Neuro/Psych  Headaches, PSYCHIATRIC DISORDERS Anxiety    GI/Hepatic   Endo/Other    Renal/GU      Musculoskeletal   Abdominal   Peds  Hematology   Anesthesia Other Findings   Reproductive/Obstetrics                           Anesthesia Physical Anesthesia Plan  ASA: II  Anesthesia Plan: General   Post-op Pain Management:    Induction: Intravenous  Airway Management Planned: Oral ETT  Additional Equipment:   Intra-op Plan:   Post-operative Plan: Extubation in OR  Informed Consent: I have reviewed the patients History and Physical, chart, labs and discussed the procedure including the risks, benefits and alternatives for the proposed anesthesia with the patient or authorized representative who has indicated his/her understanding and acceptance.   Dental advisory given  Plan Discussed with: CRNA and Surgeon  Anesthesia Plan Comments:      Anesthesia Quick Evaluation

## 2013-02-01 NOTE — Preoperative (Signed)
Beta Blockers   Reason not to administer Beta Blockers:Not Applicable 

## 2013-02-01 NOTE — Progress Notes (Signed)
Peripherally Inserted Central Catheter/Midline Placement  The IV Nurse has discussed with the patient and/or persons authorized to consent for the patient, the purpose of this procedure and the potential benefits and risks involved with this procedure.  The benefits include less needle sticks, lab draws from the catheter and patient may be discharged home with the catheter.  Risks include, but not limited to, infection, bleeding, blood clot (thrombus formation), and puncture of an artery; nerve damage and irregular heat beat.  Alternatives to this procedure were also discussed.  PICC/Midline Placement Documentation  PICC / Midline Single Lumen 01/01/13 PICC Right Basilic (Active)       Stacie Glaze Horton 02/01/2013, 11:20 AM

## 2013-02-01 NOTE — H&P (Signed)
Harold Bailey is an 47 y.o. male.   Chief Complaint: right wrist pain HPI: as above with h/o right scaphoid fracture and current treatment for MRSA infection for left foot fusion  Past Medical History  Diagnosis Date  . Hypertension   . Shortness of breath     Hx of smoking.  Marland Kitchen GERD (gastroesophageal reflux disease)   . 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  . Arthritis     Left ankle  . Polycythemia   . Anxiety   . 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 Ankle surgery  2003    Left  . Shoulder arthroscopy  2008    Left  . Brain surgery  2009    Biopsy  . Fracture surgery  2003    Left ankle  . Tympanostomy tube placement  1993  . Ankle arthroscopy  01/17/2012    Procedure: ANKLE ARTHROSCOPY;  Surgeon: Nadara Mustard, MD;  Location: [facility] OR;  Service: Orthopedics;  Laterality: Left;  . Inner ear surgery    . Ankle fusion Left 11/2012    Dr Lajoyce Corners  . Orif ankle fracture Left 12/08/2012    Procedure: OPEN REDUCTION INTERNAL FIXATION (ORIF) ANKLE FRACTURE;  Surgeon: Nadara Mustard, MD;  Location: [facility] OR;  Service: Orthopedics;  Laterality: Left;  Removal Deep Hardware, Take Down Non-Union, Revision Internal Fixation Left Ankle  . Hardware removal Left 12/08/2012    Procedure: HARDWARE REMOVAL;  Surgeon: Nadara Mustard, MD;  Location: [facility] OR;  Service: Orthopedics;  Laterality: Left;  Removal Deep Hardware, Take Down Non-Union, Revision Internal Fixation Left Ankle  . I&d extremity Left 01/01/2013    Procedure: IRRIGATION AND DEBRIDEMENT EXTREMITY;  Surgeon: Nadara Mustard, MD;  Location: [facility] OR;  Service: Orthopedics;  Laterality: Left;  Irrigation, Debridement and Placement Antibiotic Beads Left Ankle    History reviewed. No pertinent family history. Social History:  reports that he has been smoking  Cigarettes.  He has a 30 pack-year smoking history. He has never used smokeless tobacco. He reports that he does not drink alcohol or use illicit drugs.  Allergies: No Known Allergies  Medications Prior to Admission  Medication Sig Dispense Refill  . ALFALFA PO Take 4 capsules by mouth daily.       Marland Kitchen ALPRAZolam (XANAX) 1 MG tablet Take 1-2 mg by mouth at bedtime as needed for sleep or anxiety.       . bisoprolol-hydrochlorothiazide (ZIAC) 10-6.25 MG per tablet Take 1 tablet by mouth daily.      . diclofenac sodium (VOLTAREN) 1 % GEL Apply 2 g topically 4 (four) times daily.      . divalproex (DEPAKOTE ER) 500 MG 24 hr tablet Take 500 mg by mouth daily.      . fish oil-omega-3 fatty acids 1000 MG capsule Take 1,000 mg by mouth daily.       Marland Kitchen HYDROcodone-acetaminophen (NORCO) 10-325 MG per tablet Take 1 tablet by mouth 2 (two) times daily.       . Multiple Vitamin (MULTIVITAMIN WITH MINERALS) TABS Take 1 tablet by mouth daily.      . nicotine (NICODERM CQ - DOSED IN MG/24 HOURS) 21 mg/24hr patch Place 1 patch onto the skin daily.      Marland Kitchen  sertraline (ZOLOFT) 50 MG tablet Take 50 mg by mouth daily.      Marland Kitchen topiramate (TOPAMAX) 15 MG capsule Take 15 mg by mouth 2 (two) times daily.      . vancomycin (VANCOCIN) 10 G SOLR Inject 1,250 mg into the vein every 8 (eight) hours.      . celecoxib (CELEBREX) 200 MG capsule Take 200 mg by mouth daily.      Marland Kitchen oxyCODONE-acetaminophen (ROXICET) 5-325 MG per tablet Take 1 tablet by mouth every 4 (four) hours as needed for pain.  60 tablet  0    No results found for this or any previous visit (from the past 48 hour(s)). No results found.  Review of Systems  Constitutional: Positive for fever.  Musculoskeletal: Positive for joint pain.    Blood pressure 162/78, pulse 61, temperature 97.7 F (36.5 C), resp. rate 16, height 6' (1.829 m), weight 97.523 kg (215 lb), SpO2 93.00%. Physical Exam  Constitutional: He is oriented to person, place, and time. He  appears well-developed and well-nourished.  HENT:  Head: Normocephalic and atraumatic.  Cardiovascular: Normal rate.   Respiratory: Effort normal.  Musculoskeletal:       Right wrist: He exhibits bony tenderness.  Neurological: He is alert and oriented to person, place, and time.  Skin: Skin is warm.  Psychiatric: He has a normal mood and affect. His behavior is normal. Judgment and thought content normal.     Assessment/Plan As above  Plan orif right scaphoid and continued care of left ankle/foot issues  Talyah Seder A 02/01/2013, 12:04 PM

## 2013-02-01 NOTE — Op Note (Signed)
See note [id number]

## 2013-02-01 NOTE — Progress Notes (Signed)
Orthopedic Tech Progress Note Patient Details:  Harold Bailey 1965-12-08 [id number] Applied arm sling to RUE. Ortho Devices Type of Ortho Device: Arm sling Ortho Device/Splint Location: RUE Ortho Device/Splint Interventions: Application   Lesle Chris 02/01/2013, 4:35 PM

## 2013-02-02 ENCOUNTER — Encounter (HOSPITAL_COMMUNITY): Payer: Self-pay | Admitting: Orthopedic Surgery

## 2013-02-02 MED ORDER — WARFARIN SODIUM 10 MG PO TABS
10.0000 mg | ORAL_TABLET | Freq: Once | ORAL | Status: AC
  Administered 2013-02-02: 10 mg via ORAL
  Filled 2013-02-02: qty 1

## 2013-02-02 MED ORDER — VANCOMYCIN HCL 10 G IV SOLR
1250.0000 mg | Freq: Three times a day (TID) | INTRAVENOUS | Status: DC
  Administered 2013-02-02 – 2013-02-03 (×4): 1250 mg via INTRAVENOUS
  Filled 2013-02-02 (×5): qty 1250

## 2013-02-02 NOTE — H&P (Signed)
  Patient is status post open reduction internal fixation right scaphoid by Dr. Valeria Batman and status post revision ankle fusion by myself on the left. Patient states that prior to his scaphoid surgery he was still using crutches at home trying to be nonweightbearing but continued to fall at home with crutches.  With the patient now nonweightbearing to the right hand and nonweightbearing to the left foot and on IV antibiotics patient will require skilled nursing for his safety. Patient also stated that he missed several doses of his IV antibiotics at home and is at increased risk of possible loss of limb do to the ankle infection and noncompliance with IV antibiotics.  Patient states that he has also been noncompliant home with continuing to smoke.  Physical therapy and occupational therapy to evaluate for skilled nursing placement. Will complete F. L2 once this is on his chart.

## 2013-02-02 NOTE — Op Note (Signed)
[name], [name] NO.:  [id number]  MEDICAL RECORD NO.:  [id number]  LOCATION:  [facility]                         FACILITY:  [facility]  PHYSICIAN:  Artist Pais. Pedram Goodchild, M.D.DATE OF BIRTH:  10/03/1965  DATE OF PROCEDURE:  02/01/2013 DATE OF DISCHARGE:  02/01/2013                              OPERATIVE REPORT   PREOPERATIVE DIAGNOSIS:  Right scaphoid fracture.  POSTOPERATIVE DIAGNOSIS:  Right scaphoid fracture.  PROCEDURE:  Open reduction and internal fixation of above using 20-mm standard Arthrex compression screw.  SURGEON:  Artist Pais. Mina Marble, MD.  ASSISTANT:  None.  ANESTHESIA:  General.  COMPLICATIONS:  None.  DRAINS:  None.  DESCRIPTION OF PROCEDURE:  The patient was taken to the operating suite. After the induction of adequate level of general anesthesia, the right upper extremity was prepped and draped in usual sterile fashion.  An Esmarch was used to exsanguinate the limb.  Tourniquet was inflated to 250 mmHg.  At this point in time, fluoroscopy was used to mark on the skin on both the AP and lateral planes.  The guidewire which was down the long axis of the scaphoid from proximal to distal.  Once this was done, a small stab wound was made dorsally over the proximal pole.  The guidewire was placed on the proximal pole and driven from proximal to distal across the fracture site under fluoroscopic guidance.  It was then drawn __________ the proximal pole.  The wrist was fully extended. Intraoperative fluoroscopy revealed adequate reduction in AP, lateral, and oblique view.  The guidewire was then driven back out to the incision dorsally.  The scaphoid was drilled with the appropriate drill and a 20-mm Acumed compression screw was placed across the fracture site.  The wound was irrigated and loosely closed with 4-0 Vicryl Rapide.  Xeroform, 4x4s, and a radial gutter splint was applied.  The patient tolerated the procedure well and went to the recovery  room in stable fashion.     Artist Pais Mina Marble, M.D.    MAW/MEDQ  D:  02/01/2013  T:  02/02/2013  Job:  [id number]

## 2013-02-02 NOTE — Clinical Social Work Placement (Addendum)
Clinical Social Work Department  CLINICAL SOCIAL WORK PLACEMENT NOTE  08/30/2012  Patient: Harold Bailey Account Number: [id number] Admit date: 02/01/13  Clinical Social Worker: Sabino Niemann MSW Date/time:5/13/201411:30 AM  Clinical Social Work is seeking post-discharge placement for this patient at the following level of care: SKILLED NURSING (*CSW will update this form in Epic as items are completed)  02/02/2013 Patient/family provided with [facility] Department of Clinical Social Work's list of facilities offering this level of care within the geographic area requested by the patient (or if unable, by the patient's family).  02/02/2013 Patient/family informed of their freedom to choose among providers that offer the needed level of care, that participate in Medicare, Medicaid or managed care program needed by the patient, have an available bed and are willing to accept the patient.  02/02/2013 Patient/family informed of [facility]' ownership interest in [facility], as well as of the fact that they are under no obligation to receive care at this facility.  PASARR submitted to EDS on Pre-existing PASARR number received from EDS on Pre-existing FL2 transmitted to all facilities in geographic area requested by pt/family on 02/02/2013  FL2 transmitted to all facilities within larger geographic area on  Patient informed that his/her managed care company has contracts with or will negotiate with certain facilities, including the following:  Patient/family informed of bed offers received: 02/05/2013 Patient chooses bed at [facility] Physician recommends and patient chooses bed at  Patient to be transferred to on 02/05/2013 Patient to be transferred to facility by [facility] The following physician request were entered in Epic:  Additional Comments:

## 2013-02-02 NOTE — Progress Notes (Signed)
[facility]  Patient Status: Active (receiving services up to time of hospitalization)  [facility] is providing the following services: RN, PT and OT.  Pt has been receiving IV antibiotics at home.  If patient discharges after hours, please call [phone].   Harold Bailey 02/02/2013, 11:43 AM

## 2013-02-02 NOTE — Progress Notes (Signed)
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 *  Vitals: BP 135/67  Pulse 60  Temp(Src) 97.1 F (36.2 C)  Resp 18  Ht 6' 0.05" (1.83 m)  Wt 214 lb 15.2 oz (97.5 kg)  BMI 29.11 kg/m2  SpO2 100%  Labs:  Recent Labs  02/01/13 1140  WBC 8.8  HGB 16.4  PLT 136*  CREATININE 0.77   Lab Results  Component Value Date   INR 1.01 02/02/2013   INR 1.34 01/05/2013   INR 1.17 01/04/2013    Estimated Creatinine Clearance: 136.7 ml/min (by C-G formula based on Cr of 0.77).   Microbiology: Recent Results (from the past 720 hour(s))  SURGICAL PCR SCREEN     Status: None   Collection Time    02/01/13 11:40 AM      Result Value Range Status   MRSA, PCR NEGATIVE  NEGATIVE Final   Staphylococcus aureus NEGATIVE  NEGATIVE Final   Comment:            The Xpert SA Assay (FDA     approved for NASAL specimens     in patients over 16 years of age),     is one component of     a comprehensive surveillance     program.  Test performance has     been validated by [employer] for patients greater     than or equal to 4 year old.     It is not intended     to diagnose infection nor to     guide or monitor treatment.   Current Medication:  bisoprolol-hydrochlorothiazide 1 tablet Oral Daily  [COMPLETED] coumadin book  Does not apply Once  divalproex 500 mg Oral Daily  nicotine 21 mg Transdermal Q24H  sertraline 50 mg Oral Daily  topiramate 15 mg Oral BID  vancomycin 1,250 mg Intravenous Q8H  [COMPLETED] warfarin 10 mg Oral Once  warfarin  Does not apply Once  Warfarin - Pharmacist Dosing Inpatient  Does not apply q1800  [DISCONTINUED] topiramate 15 mg Oral BID  [DISCONTINUED] vancomycin 1,250 mg Intravenous Q12H  [DISCONTINUED] vancomycin 1,250 mg Intravenous Q8H  [DISCONTINUED] vancomycin  1,000 mg Intravenous Q12H     Anti-infectives Anti-infectives   Start     Dose/Rate Route Frequency Ordered Stop   02/02/13 1824  vancomycin (VANCOCIN) 1,250 mg in sodium chloride 0.9 % 250 mL IVPB     1,250 mg 166.7 mL/hr over 90 Minutes Intravenous Every 8 hours 02/02/13 1123     02/01/13 2200  vancomycin (VANCOCIN) injection 1,250 mg  Status:  Discontinued     1,250 mg Intravenous 3 times per day 02/01/13 1749 02/01/13 1808   02/01/13 2000  vancomycin (VANCOCIN) 1,250 mg in sodium chloride 0.9 % 250 mL IVPB  Status:  Discontinued     1,250 mg 166.7 mL/hr over 90 Minutes Intravenous Every 12 hours 02/01/13 1817 02/02/13 1123   02/01/13 1800  vancomycin (VANCOCIN) IVPB 1000 mg/200 mL premix  Status:  Discontinued     1,000 mg 200 mL/hr over 60 Minutes Intravenous Every 12 hours 02/01/13 1749 02/01/13 1806     Assessment:  47 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  Patient relates that his Vancomycin dose prior to this admission was 1250 mg IV q 8 [eight] hours with  therapeutic levels "up there where we want them". Patient currently getting Vancomycin q 12 [twelve] hours.  Patient started on Coumadin yesterday for VTE prophylaxis.  INR 1.01.  No bleeding complications noted .   Goal of Therapy:   Vancomycin trough level 15-20 mcg/ml  INR 2 - 3  Plan:   Change Vancomycin to 1250 mg IV q 8 hours, as taken PTA Will follow-up Vancomycin trough level at ss following dosage schedule. Coumadin 10 mg tonight. Daily INR's, CBC.  Monitor for bleeding complications   Laurena Bering, Pharm.D.  02/02/2013 11:25 AM

## 2013-02-02 NOTE — Progress Notes (Signed)
Physical Therapy Evaluation Patient Details Name: Harold Bailey MRN: [id number] DOB: 1965/11/10 Today's Date: 02/02/2013 Time: 1151-1206 PT Time Calculation (min): 15 min  PT Assessment / Plan / Recommendation Clinical Impression  47 yo male with R scaphoid ORIF and Lankle fusion revision presents with decr functional mobility, unsteadiness with gait; will benefit from PT to maximize safety with mobility and to facilitate dc planning    PT Assessment  Patient needs continued PT services    Follow Up Recommendations  SNF    Does the patient have the potential to tolerate intense rehabilitation      Barriers to Discharge        Equipment Recommendations   (R platform "RW)    Recommendations for Other Services OT consult   Frequency Min 3X/week    Precautions / Restrictions Precautions Precautions: Fall Required Braces or Orthoses: Other Brace/Splint Other Brace/Splint: Noted boot L LE Restrictions RUE Weight Bearing: Weight bear through elbow only LLE Weight Bearing: Non weight bearing   Pertinent Vitals/Pain Pain in R wrist and L ankle; 5/10 with moblity; Repositioned in recliner Worth considering a mission sling      Mobility  Bed Mobility Bed Mobility: Sitting - Scoot to [place] of Bed Sitting - Scoot to [place] of Bed: 4: Min guard Details for Bed Mobility Assistance: Cues for optimal positioning in prep for standing Transfers Transfers: Sit to Stand Sit to Stand: 4: Min assist Details for Transfer Assistance: Cues for precautions, technique, an duse o R platform; min assist to steady platform RW Ambulation/Gait Ambulation/Gait Assistance: 4: Min assist Ambulation Distance (Feet): 3 Feet (pivot steps bed to chair) Assistive device: Right platform walker Ambulation/Gait Assistance Details: cues Gait Pattern: Step-to pattern    Exercises     PT Diagnosis: Difficulty walking  PT Problem List: Decreased strength;Decreased range of motion;Decreased activity  tolerance;Decreased balance;Decreased coordination;Decreased knowledge of use of DME;Pain PT Treatment Interventions: DME instruction;Gait training;Functional mobility training;Therapeutic activities;Therapeutic exercise;Patient/family education   PT Goals Acute Rehab PT Goals PT Goal Formulation: With patient Time For Goal Achievement: 02/16/13 Potential to Achieve Goals: Good Pt will go Supine/Side to Sit: with modified independence PT Goal: Supine/Side to Sit - Progress: Goal set today Pt will go Sit to Supine/Side: with modified independence PT Goal: Sit to Supine/Side - Progress: Goal set today Pt will go Sit to Stand: with supervision PT Goal: Sit to Stand - Progress: Goal set today Pt will go Stand to Sit: with supervision PT Goal: Stand to Sit - Progress: Goal set today Pt will Transfer Bed to Chair/Chair to Bed: with supervision PT Transfer Goal: Bed to Chair/Chair to Bed - Progress: Goal set today Pt will Ambulate: 51 - 150 feet;with supervision;with rolling walker (Right platform) PT Goal: Ambulate - Progress: Goal set today  Visit Information  Last PT Received On: 02/02/13 Assistance Needed:  (+2 for safety with amb; +1 transfers)    Subjective Data  Subjective: Agreeable to OOB; reports his equilibrium is off Patient Stated Goal: get better; REports he'd like to go to [employer]   Prior Functioning       Cognition  Cognition Arousal/Alertness: Awake/alert Behavior During Therapy: WFL for tasks assessed/performed Overall Cognitive Status: Within Functional Limits for tasks assessed    Extremity/Trunk Assessment     Balance    End of Session PT - End of Session Equipment Utilized During Treatment: Gait belt Activity Tolerance: Patient tolerated treatment well Patient left: in chair;with call bell/phone within reach Nurse Communication: Mobility status  GP  Van Clines [place], [place] [phone]  02/02/2013, 3:16 PM

## 2013-02-02 NOTE — Anesthesia Postprocedure Evaluation (Signed)
  Anesthesia Post-op Note  Patient: Harold Bailey  Procedure(s) Performed: Procedure(s): OPEN REDUCTION INTERNAL FIXATION (ORIF) RIGHT SCAPHOID FRACTURE WITH DISTAL RADIUS GRAFT (Right)  Patient Location: Nursing Unit  Anesthesia Type:General  Level of Consciousness: awake, alert , oriented and patient cooperative  Airway and Oxygen Therapy: Patient Spontanous Breathing  Post-op Pain: mild  Post-op Assessment: Post-op Vital signs reviewed, Patient's Cardiovascular Status Stable, Respiratory Function Stable, Patent Airway, No signs of Nausea or vomiting, Adequate PO intake, Pain level controlled, No headache, No backache, No residual numbness and No residual motor weakness  Post-op Vital Signs: Reviewed and stable  Complications: No apparent anesthesia complications

## 2013-02-03 MED ORDER — VANCOMYCIN HCL IN DEXTROSE 1-5 GM/200ML-% IV SOLN
1000.0000 mg | Freq: Three times a day (TID) | INTRAVENOUS | Status: DC
  Administered 2013-02-04 – 2013-02-05 (×5): 1000 mg via INTRAVENOUS
  Filled 2013-02-03 (×7): qty 200

## 2013-02-03 MED ORDER — WARFARIN SODIUM 10 MG PO TABS
10.0000 mg | ORAL_TABLET | Freq: Once | ORAL | Status: AC
  Administered 2013-02-03: 10 mg via ORAL
  Filled 2013-02-03: qty 1

## 2013-02-03 MED ORDER — ASPIRIN EC 325 MG PO TBEC: 325.0000 mg | DELAYED_RELEASE_TABLET | Freq: Every day | ORAL | Status: DC

## 2013-02-03 MED ORDER — HYDROCODONE-ACETAMINOPHEN 10-325 MG PO TABS: 1.0000 | ORAL_TABLET | ORAL | Status: DC | PRN

## 2013-02-03 MED ORDER — VANCOMYCIN HCL 10 G IV SOLR: 1250.0000 mg | Freq: Three times a day (TID) | INTRAVENOUS | Status: DC

## 2013-02-03 MED ORDER — NICOTINE 21 MG/24HR TD PT24: 1.0000 | MEDICATED_PATCH | TRANSDERMAL | Status: DC

## 2013-02-03 NOTE — Progress Notes (Signed)
ANTIBIOTIC CONSULT NOTE - FOLLOW UP  Pharmacy Consult for Vancomycin Indication: MRSA foot infection  No Known Allergies  Patient Measurements: Height: 6' 0.05" (183 cm) Weight: 214 lb 15.2 oz (97.5 kg) IBW/kg (Calculated) : 77.71 Adjusted Body Weight:   Vital Signs: Temp: 97.5 F (36.4 C) (05/14 1246) BP: 134/62 mmHg (05/14 1246) Pulse Rate: 64 (05/14 1246) Intake/Output from previous day: 05/13 0701 - 05/14 0700 In: 672 [I.V.:172; IV Piggyback:500] Out: 400 [Urine:400] Intake/Output from this shift: Total I/O In: -  Out: 800 [Urine:800]  Labs:  Recent Labs  02/01/13 1140  WBC 8.8  HGB 16.4  PLT 136*  CREATININE 0.77   Estimated Creatinine Clearance: 136.7 ml/min (by C-G formula based on Cr of 0.77).  Recent Labs  02/03/13 1735  VANCOTROUGH 21.6*     Microbiology: Recent Results (from the past 720 hour(s))  SURGICAL PCR SCREEN     Status: None   Collection Time    02/01/13 11:40 AM      Result Value Range Status   MRSA, PCR NEGATIVE  NEGATIVE Final   Staphylococcus aureus NEGATIVE  NEGATIVE Final   Comment:            The Xpert SA Assay (FDA     approved for NASAL specimens     in patients over 9 years of age),     is one component of     a comprehensive surveillance     program.  Test performance has     been validated by [employer] for patients greater     than or equal to 84 year old.     It is not intended     to diagnose infection nor to     guide or monitor treatment.    Anti-infectives   Start     Dose/Rate Route Frequency Ordered Stop   02/03/13 0000  sodium chloride 0.9 % SOLN 250 mL with vancomycin 10 G SOLR 1,250 mg     1,250 mg 166.7 mL/hr over 90 Minutes Intravenous Every 8 hours 02/03/13 0655     02/02/13 1824  vancomycin (VANCOCIN) 1,250 mg in sodium chloride 0.9 % 250 mL IVPB     1,250 mg 166.7 mL/hr over 90 Minutes Intravenous Every 8 hours 02/02/13 1123     02/01/13 2200  vancomycin (VANCOCIN) injection 1,250 mg   Status:  Discontinued     1,250 mg Intravenous 3 times per day 02/01/13 1749 02/01/13 1808   02/01/13 2000  vancomycin (VANCOCIN) 1,250 mg in sodium chloride 0.9 % 250 mL IVPB  Status:  Discontinued     1,250 mg 166.7 mL/hr over 90 Minutes Intravenous Every 12 hours 02/01/13 1817 02/02/13 1123   02/01/13 1800  vancomycin (VANCOCIN) IVPB 1000 mg/200 mL premix  Status:  Discontinued     1,000 mg 200 mL/hr over 60 Minutes Intravenous Every 12 hours 02/01/13 1749 02/01/13 1806      Assessment: 47yom continues on Vancomycin for MRSA foot infection. Patient is afebrile and WBC remain wnl. Vancomycin trough drawn this evening was just above goal level at 21.6 - will reduce dose and recommend to check follow-up Vancomycin trough and SCr in 3-5 days.  - CrCl >100 ml/min  Goal of Therapy:  Vancomycin trough level 15-20 mcg/ml  Plan:  1. Decrease Vancomycin to 1g IV q8h 2. Recommend to check Vancomycin trough and SCr in 3-5 days  Cleon Dew [phone] 02/03/2013,9:00 PM

## 2013-02-03 NOTE — Progress Notes (Signed)
ANTICOAGULATION CONSULT NOTE - Follow Up Consult  Pharmacy Consult for Coumadin Indication: VTE prophylaxis  No Known Allergies  Patient Measurements: Height: 6' 0.05" (183 cm) Weight: 214 lb 15.2 oz (97.5 kg) IBW/kg (Calculated) : 77.71  Vital Signs: Temp: 97.9 F (36.6 C) (05/14 0654) BP: 130/75 mmHg (05/14 0654) Pulse Rate: 65 (05/14 0654)  Labs:  Recent Labs  02/01/13 1140 02/02/13 0520 02/03/13 0515  HGB 16.4  --   --   HCT 46.4  --   --   PLT 136*  --   --   LABPROT  --  13.2 15.9*  INR  --  1.01 1.30  CREATININE 0.77  --   --     Estimated Creatinine Clearance: 136.7 ml/min (by C-G formula based on Cr of 0.77).   Medications:  Scheduled:  . bisoprolol-hydrochlorothiazide  1 tablet Oral Daily  . divalproex  500 mg Oral Daily  . nicotine  21 mg Transdermal Q24H  . sertraline  50 mg Oral Daily  . topiramate  15 mg Oral BID  . vancomycin  1,250 mg Intravenous Q8H  . warfarin   Does not apply Once  . Warfarin - Pharmacist Dosing Inpatient   Does not apply q1800    Assessment: 47 year old male receiving Coumadin for VTE prophylaxis s/p ORIF.  His INR has responded appropriately to his first 2 doses of Coumadin.  No bleeding complications noted.  Goal of Therapy:  INR 2-3   Plan:  Coumadin 10mg  today Check AM PT/INR  Estella Husk, Pharm.D., BCPS Clinical Pharmacist  Phone [phone] Pager [phone] 02/03/2013, 12:41 PM

## 2013-02-03 NOTE — Discharge Summary (Signed)
Physician Discharge Summary  Patient ID: Harold Bailey MRN: [id number] DOB/AGE: [birth year] 47 y.o.  Admit date: 02/01/2013 Discharge date: 02/03/2013  Admission Diagnoses: Right scaphoid fracture and left ankle fusion with infection  Discharge Diagnoses: Right scaphoid fracture and left ankle in with infection Active Problems:   * No active hospital problems. *   Discharged Condition: stable  Hospital Course: Patient's hospital course was essentially unremarkable. With the patient's most recent surgery he is now nonweightbearing through the right hand and left foot. Patient was unstable prior to this situation when he was only nonweightbearing through his left foot. Patient had multiple falls with attempting crutches at home. Due to the patient's unstable gait and now nonweightbearing through 2 extremities patient will require discharge to skilled nursing facility. Patient also will require continuation of his IV vancomycin.  Consults: None  Significant Diagnostic Studies: labs: Routine labs  Treatments: surgery: See operative note  Discharge Exam: Blood pressure 139/76, pulse 64, temperature 98.7 F (37.1 C), resp. rate 18, height 6' 0.05" (1.83 m), weight 97.5 kg (214 lb 15.2 oz), SpO2 97.00%. Incision/Wound: dressings clean dry and intact  Disposition: 30-Still a Patient     Medication List    ASK your doctor about these medications       ALFALFA PO  Take 4 capsules by mouth daily.     ALPRAZolam 1 MG tablet  Commonly known as:  XANAX  Take 1-2 mg by mouth at bedtime as needed for sleep or anxiety.     bisoprolol-hydrochlorothiazide 10-6.25 MG per tablet  Commonly known as:  ZIAC  Take 1 tablet by mouth daily.     diclofenac sodium 1 % Gel  Commonly known as:  VOLTAREN  Apply 2 g topically 4 (four) times daily.     divalproex 500 MG 24 hr tablet  Commonly known as:  DEPAKOTE ER  Take 500 mg by mouth daily.     fluticasone 50 MCG/ACT nasal spray  Commonly  known as:  FLONASE  Place 2 sprays into the nose daily as needed for rhinitis or allergies.     HYDROcodone-acetaminophen 10-325 MG per tablet  Commonly known as:  NORCO  Take 1 tablet by mouth 2 (two) times daily as needed for pain.     multivitamin with minerals Tabs  Take 1 tablet by mouth daily.     nicotine 21 mg/24hr patch  Commonly known as:  NICODERM CQ - dosed in mg/24 hours  Place 1 patch onto the skin daily.     sertraline 50 MG tablet  Commonly known as:  ZOLOFT  Take 50 mg by mouth daily.     topiramate 15 MG capsule  Commonly known as:  TOPAMAX  Take 15 mg by mouth 2 (two) times daily.     vancomycin 10 G Solr  Commonly known as:  VANCOCIN  Inject 1,250 mg into the vein every 8 (eight) hours.           Follow-up Information   Follow up with DUDA,MARCUS V, MD In 2 weeks.   Contact information:   [address] [place] [address] [place] [address] [phone]       Signed: Nadara Mustard 02/03/2013, 6:46 AM

## 2013-02-03 NOTE — Evaluation (Signed)
Occupational Therapy Evaluation Patient Details Name: Harold Bailey MRN: [id number] DOB: 1966/07/24 Today's Date: 02/03/2013 Time: 2952-8413 OT Time Calculation (min): 28 min  OT Assessment / Plan / Recommendation Clinical Impression  Pt is a 47 yr old male admitted for internal fixation of right hand scaphoid fracture as well as right ankle fusion.  Pt with history of falls and unsteadiness at home prior to this incident.  Currently needs overall min assist for selfcare tasks and functional transfers will need 24 hour supervision at home for optimal safety.  Feel SNF level rehab will be his best choice for rehab.  Will defer OT treatment to SNF.    OT Assessment  All further OT needs can be met in the next venue of care    Follow Up Recommendations  SNF    Barriers to Discharge Decreased caregiver support    Equipment Recommendations  None recommended by OT          Precautions / Restrictions Precautions Precautions: Fall Required Braces or Orthoses: Other Brace/Splint Other Brace/Splint: Noted boot L LE Restrictions Weight Bearing Restrictions: Yes RUE Weight Bearing: Non weight bearing LLE Weight Bearing: Non weight bearing   Pertinent Vitals/Pain Pain 5/10 in the left hand mostly, meds given earlier and pt repositioned,    ADL  Eating/Feeding: Simulated;Independent Where Assessed - Eating/Feeding: Edge of bed Grooming: Performed;Minimal assistance Where Assessed - Grooming: Supported standing Upper Body Bathing: Simulated;Set up Where Assessed - Upper Body Bathing: Unsupported sitting Lower Body Bathing: Simulated;Minimal assistance Where Assessed - Lower Body Bathing: Supported sit to stand Upper Body Dressing: Simulated;Set up Where Assessed - Upper Body Dressing: Unsupported sitting Lower Body Dressing: Performed;Minimal assistance Where Assessed - Lower Body Dressing: Supported sit to stand Toilet Transfer: Performed;Minimal assistance Toilet Transfer  Method: Other (comment) (Ambulate with RW and platform on the right side.) Toilet Transfer Equipment: Comfort height toilet;Grab bars Toileting - Clothing Manipulation and Hygiene: Minimal assistance Where Assessed - Engineer, mining and Hygiene: Sit to stand from 3-in-1 or toilet Tub/Shower Transfer Method: Not assessed Equipment Used: Rolling walker;Other (comment);Gait belt (Platform on right side of RW.) Transfers/Ambulation Related to ADLs: Pt overall min assist for short distance mobility to the bathroom using the RW with platform on the right side.  Needs min instructional cueing to maintain NWBing over the LLE with transition sit to stand. ADL Comments: Pt doing well able to perform all selfcare tasks at a supervision to min assist level (LB selfcare) and functional transfers.  Needs min instructional cueing for correct position of walker with mobility and during transfers as well as for maintatining NWBing over the LLE.  Plans to transfer to SNF for further rehab post acute.  Feel this is appropriate secondary to not having 24 hour supervision at home.    OT Diagnosis: Generalized weakness;Acute pain  OT Problem List: Decreased strength;Impaired balance (sitting and/or standing);Decreased knowledge of use of DME or AE;Decreased knowledge of precautions;Impaired UE functional use    Visit Information  Last OT Received On: 02/03/13 Assistance Needed: +1    Subjective Data  Subjective: I want to be able to walk. Patient Stated Goal: Be able to walk again without use of a cane or assistive device.   Prior Functioning     Home Living Lives With:  (Pt from [facility] going back for rehab) Available Help at Discharge: Skilled Nursing Facility Home Adaptive Equipment: Quad cane;Walker - rolling Prior Function Level of Independence: Independent (Prior to original accident) Driving: No Vocation: On disability Communication  Communication: No difficulties Dominant  Hand: Right         Vision/Perception Vision - History Baseline Vision: No visual deficits Patient Visual Report: No change from baseline Vision - Assessment Eye Alignment: Within Functional Limits Vision Assessment: Vision not tested Perception Perception: Within Functional Limits Praxis Praxis: Intact   Cognition  Cognition Arousal/Alertness: Awake/alert Behavior During Therapy: WFL for tasks assessed/performed Overall Cognitive Status: History of cognitive impairments - at baseline    Extremity/Trunk Assessment Right Upper Extremity Assessment RUE ROM/Strength/Tone: Deficits RUE ROM/Strength/Tone Deficits: Pt with cast on right forearm down to MPs.  Demonstrates AROM WFLs for elbow and shoulder.  Finger and thumb flexion limited by constraints of the cast. RUE Sensation: WFL - Light Touch RUE Coordination: Deficits RUE Coordination Deficits: FM abilities limited for flexion by forearm cast as well. Trunk Assessment Trunk Assessment: Normal     Mobility Bed Mobility Bed Mobility: Sit to Supine Sit to Supine: 5: Supervision;HOB elevated Transfers Transfers: Sit to Stand;Stand to Sit Sit to Stand: 4: Min assist;With upper extremity assist;From bed Stand to Sit: 4: Min assist;With upper extremity assist;To toilet Details for Transfer Assistance: Min instructional cueing to back up to the surface he is transferring to and reaching back with the LUE.        Balance Balance Balance Assessed: Yes Dynamic Standing Balance Dynamic Standing - Balance Support: Bilateral upper extremity supported Dynamic Standing - Level of Assistance: 4: Min assist   End of Session OT - End of Session Equipment Utilized During Treatment: Gait belt;Left ankle foot orthosis Activity Tolerance: Patient tolerated treatment well Patient left: in bed;with call bell/phone within reach Nurse Communication: Mobility status     Blaklee Shores OTR/L Pager number [phone] 02/03/2013, 9:08 AM

## 2013-02-03 NOTE — Progress Notes (Signed)
Patient ID: Harold Bailey, male   DOB: 01-02-1966, 47 y.o.   MRN: [id number] Discharge summary completed today, F. L2 signed. Patient will need to continue IV antibiotics with vancomycin at discharge for a total of 6 weeks of IV vancomycin. Prescriptions on the chart for Vicodin.

## 2013-02-04 ENCOUNTER — Encounter (HOSPITAL_COMMUNITY): Payer: Self-pay | Admitting: General Practice

## 2013-02-04 HISTORY — PX: SCAPHOID FRACTURE SURGERY: SHX769

## 2013-02-04 MED ORDER — WARFARIN SODIUM 10 MG PO TABS
10.0000 mg | ORAL_TABLET | Freq: Once | ORAL | Status: AC
  Administered 2013-02-04: 10 mg via ORAL
  Filled 2013-02-04: qty 1

## 2013-02-04 MED ORDER — SODIUM CHLORIDE 0.9 % IJ SOLN
10.0000 mL | INTRAMUSCULAR | Status: DC | PRN
  Administered 2013-02-05 (×2): 10 mL

## 2013-02-04 NOTE — Clinical Social Work Psychosocial (Signed)
Clinical Social Work Department  BRIEF PSYCHOSOCIAL ASSESSMENT  Patient: Harold Bailey  Account Number: [id number] Admit date: 02/01/13 Clinical Social Worker Glen Kesinger Riley Kill, MSW Date/Time:  Referred by: Physician Date Referred: 02/01/13 Referred for   SNF Placement   Other Referral:  Interview type: Patient  Other interview type: PSYCHOSOCIAL DATA  Living Status: Wife Admitted from facility:  Level of care:  Primary support name: Floy Sabina Primary support relationship to patient: Wife Degree of support available:  Strong and vested  CURRENT CONCERNS  Current Concerns   Post-Acute Placement   Other Concerns:  SOCIAL WORK ASSESSMENT / PLAN  CSW met with pt re: PT recommendation for SNF.   Pt lives with his wife  CSW explained placement process and answered questions.   Pt reports [facility]  as his preference    CSW completed FL2 and initiated SNF search.     Assessment/plan status: Information/Referral to [facility]  Other assessment/ plan:  Information/referral to community resources:  SNF   PTAR  PATIENT'S/FAMILY'S RESPONSE TO PLAN OF CARE:  Pt  reports he is agreeable to ST SNF in order to increase strength and independence with mobility prior to returning home  Pt verbalized understanding of placement process and appreciation for CSW assist.   Sabino Niemann, MSW [phone]

## 2013-02-04 NOTE — Progress Notes (Signed)
Physical Therapy Treatment Patient Details Name: Harold Bailey MRN: [id number] DOB: 1966/04/01 Today's Date: 02/04/2013 Time: 9629-5284 PT Time Calculation (min): 23 min  PT Assessment / Plan / Recommendation Comments on Treatment Session  Pt. seems to have forgotten that he is NWB right UE and was re-educated on this restriction.  He was fully compliant after reinforcement.  Making good progress with his mobility.    Follow Up Recommendations  SNF     Does the patient have the potential to tolerate intense rehabilitation     Barriers to Discharge        Equipment Recommendations  Other (comment) (R PFRW)    Recommendations for Other Services OT consult  Frequency Min 3X/week   Plan Discharge plan remains appropriate;Frequency remains appropriate    Precautions / Restrictions Precautions Precautions: Fall Required Braces or Orthoses: Other Brace/Splint Other Brace/Splint: Noted boot L LE Restrictions Weight Bearing Restrictions: Yes RUE Weight Bearing: Non weight bearing LLE Weight Bearing: Non weight bearing   Pertinent Vitals/Pain See vitals tab     Mobility  Bed Mobility Bed Mobility: Supine to Sit;Sitting - Scoot to Edge of Bed Supine to Sit: 5: Supervision Sitting - Scoot to Edge of Bed: 5: Supervision Details for Bed Mobility Assistance: cues to avoid use of R UE for weightbearing and for safe technique Transfers Transfers: Sit to Stand Sit to Stand: 4: Min assist;With upper extremity assist;From bed (using left arm) Stand to Sit: 4: Min assist;With armrests;To chair/3-in-1;Other (comment) (using left arm) Details for Transfer Assistance: cueing to maintain NWB R UE; self manages L LE NWB Ambulation/Gait Ambulation/Gait Assistance: 4: Min assist Ambulation Distance (Feet): 50 Feet Assistive device: Right platform walker Ambulation/Gait Assistance Details: safety cues to slow down slightly Gait Pattern: Step-to pattern (one leg hop) Gait velocity:  decreased    Exercises     PT Diagnosis:    PT Problem List:   PT Treatment Interventions:     PT Goals Acute Rehab PT Goals Time For Goal Achievement: 02/16/13 Potential to Achieve Goals: Good Pt will go Supine/Side to Sit: with modified independence PT Goal: Supine/Side to Sit - Progress: Progressing toward goal Pt will go Sit to Stand: with supervision PT Goal: Sit to Stand - Progress: Progressing toward goal Pt will go Stand to Sit: with supervision PT Goal: Stand to Sit - Progress: Progressing toward goal Pt will Ambulate: 51 - 150 feet;with supervision;with rolling walker PT Goal: Ambulate - Progress: Progressing toward goal  Visit Information  Last PT Received On: 02/04/13 Assistance Needed: +1    Subjective Data  Subjective: "I didn't know I wasn't supposed to bear weight through right hand"   Cognition  Cognition Arousal/Alertness: Awake/alert Behavior During Therapy: WFL for tasks assessed/performed Overall Cognitive Status: History of cognitive impairments - at baseline    Balance     End of Session PT - End of Session Equipment Utilized During Treatment: Gait belt;Other (comment) (cam boot left LE) Activity Tolerance: Patient tolerated treatment well Patient left: in chair;with call bell/phone within reach Nurse Communication: Mobility status   GP     [name] 02/04/2013, 12:37 PM Weldon Picking PT Acute Rehab Services [phone] Beeper [phone]

## 2013-02-04 NOTE — Progress Notes (Signed)
ANTICOAGULATION CONSULT NOTE - Follow Up Consult   Pharmacy Consult :  Coumadin Indication: VTE prophylaxis  Dosing Weight: 97.5 kg  Labs:  Recent Labs  02/02/13 0520 02/03/13 0515 02/04/13 0634  INR 1.01 1.30 1.76*   Lab Results  Component Value Date   INR 1.76* 02/04/2013   INR 1.30 02/03/2013   INR 1.01 02/02/2013   Estimated Creatinine Clearance: 136.7 ml/min (by C-G formula based on Cr of 0.77).  Medications:  Scheduled:  . bisoprolol-hydrochlorothiazide  1 tablet Oral Daily  . divalproex  500 mg Oral Daily  . nicotine  21 mg Transdermal Q24H  . sertraline  50 mg Oral Daily  . topiramate  15 mg Oral BID  . vancomycin  1,000 mg Intravenous Q8H   Assessment:  47 year old male receiving Coumadin for VTE prophylaxis s/p ORIF.  INR trending up toward clinical goals.  No bleeding complications noted    Goal of Therapy:  INR 2-3   Plan:   Repeat Coumadin 10 mg today. Daily INR's, CBC. Monitor for bleeding complications   Laurena Bering, Pharm.D.  02/04/2013,11:58 AM

## 2013-02-05 LAB — PROTIME-INR
INR: 2.11 — ABNORMAL HIGH (ref 0.00–1.49)
Prothrombin Time: 22.8 seconds — ABNORMAL HIGH (ref 11.6–15.2)

## 2013-02-05 MED ORDER — VANCOMYCIN HCL IN DEXTROSE 1-5 GM/200ML-% IV SOLN: 1000.0000 mg | Freq: Three times a day (TID) | INTRAVENOUS | Status: DC

## 2013-02-05 MED ORDER — HEPARIN SOD (PORK) LOCK FLUSH 100 UNIT/ML IV SOLN
250.0000 [IU] | INTRAVENOUS | Status: DC | PRN
  Administered 2013-02-05: 250 [IU]
  Filled 2013-02-05: qty 3

## 2013-02-05 MED ORDER — HEPARIN SOD (PORK) LOCK FLUSH 100 UNIT/ML IV SOLN
250.0000 [IU] | Freq: Every day | INTRAVENOUS | Status: DC
  Filled 2013-02-05: qty 3

## 2013-02-05 MED ORDER — WARFARIN SODIUM 10 MG PO TABS
10.0000 mg | ORAL_TABLET | Freq: Every day | ORAL | Status: DC
  Filled 2013-02-05: qty 1

## 2013-02-05 NOTE — Progress Notes (Signed)
Physical Therapy Treatment Patient Details Name: Harold Bailey MRN: [id number] DOB: [birth year] Today's Date: 02/05/2013 Time: 4098-1191 PT Time Calculation (min): 23 min  PT Assessment / Plan / Recommendation Comments on Treatment Session  Pt. compliant with NWB R UE and L LE and is progressing his mobility well. He needs to continue to have PT at SNF for maximizing functional independence    Follow Up Recommendations  SNF     Does the patient have the potential to tolerate intense rehabilitation     Barriers to Discharge        Equipment Recommendations  Other (comment) (R PFRW)    Recommendations for Other Services    Frequency Min 3X/week   Plan Discharge plan remains appropriate;Frequency remains appropriate    Precautions / Restrictions Precautions Precautions: Fall Required Braces or Orthoses: Other Brace/Splint Other Brace/Splint: cam walker L LE Restrictions Weight Bearing Restrictions: Yes RUE Weight Bearing: Non weight bearing LLE Weight Bearing: Non weight bearing   Pertinent Vitals/Pain See vitals tab     Mobility  Bed Mobility Bed Mobility: Supine to Sit;Sitting - Scoot to Edge of Bed Supine to Sit: 5: Supervision Sitting - Scoot to Edge of Bed: 6: Modified independent (Device/Increase time) Sit to Supine: 5: Supervision Details for Bed Mobility Assistance: safety reminders given Transfers Transfers: Sit to Stand Sit to Stand: 4: Min assist;With upper extremity assist;From bed;Other (comment) (using left arm) Stand to Sit: 4: Min assist;With armrests;To bed;Other (comment) (with left arm) Details for Transfer Assistance: good technique, min assist to steady Ambulation/Gait Ambulation/Gait Assistance: 4: Min assist Ambulation Distance (Feet): 50 Feet Assistive device: Right platform walker Ambulation/Gait Assistance Details: min assist for steadying and safety; occasional standing rest breaks needed Gait Pattern: Step-to pattern (single leg  hop) Gait velocity: decreased    Exercises     PT Diagnosis:    PT Problem List:   PT Treatment Interventions:     PT Goals Acute Rehab PT Goals Pt will go Supine/Side to Sit: with modified independence PT Goal: Supine/Side to Sit - Progress: Progressing toward goal Pt will go Sit to Supine/Side: with modified independence PT Goal: Sit to Supine/Side - Progress: Met Pt will go Sit to Stand: with supervision PT Goal: Sit to Stand - Progress: Progressing toward goal Pt will go Stand to Sit: with supervision PT Goal: Stand to Sit - Progress: Progressing toward goal Pt will Ambulate: 51 - 150 feet;with supervision;with rolling walker PT Goal: Ambulate - Progress: Progressing toward goal  Visit Information  Last PT Received On: 02/05/13 Assistance Needed: +1    Subjective Data  Subjective: I asked Dr. Rayburn Ma about my right wrist (in regards to the fact that he reported that he was bearing weight through it before session yesterday)   Cognition  Cognition Arousal/Alertness: Awake/alert Behavior During Therapy: WFL for tasks assessed/performed Overall Cognitive Status: History of cognitive impairments - at baseline    Balance     End of Session PT - End of Session Equipment Utilized During Treatment: Gait belt;Other (comment) (cam boot L LE) Activity Tolerance: Patient tolerated treatment well Patient left: in bed;with call bell/phone within reach Nurse Communication: Mobility status   GP     [name] 02/05/2013, 12:04 PM Weldon Picking PT Acute Rehab Services [phone] Beeper [phone]

## 2013-02-05 NOTE — Progress Notes (Signed)
Clinical social worker assisted with patient discharge to skilled nursing facility, [facility].  CSW addressed all family questions and concerns. CSW copied chart and added all important documents. CSW also set up patient transportation with [facility]. Clinical Social Worker will sign off for now as social work intervention is no longer needed.   Demica Zook, MSW, [phone] 

## 2013-02-05 NOTE — Progress Notes (Signed)
ANTICOAGULATION CONSULT NOTE - Follow Up Consult   Pharmacy Consult :  Coumadin Indication: VTE prophylaxis  Dosing Weight: 97.5 kg  BP 92/56  Pulse 62  Temp(Src) 98.1 F (36.7 C) (Oral)  Resp 18  Ht 6' 0.05" (1.83 m)  Wt 214 lb 15.2 oz (97.5 kg)  BMI 29.11 kg/m2  SpO2 97%   Labs:  Recent Labs  02/03/13 0515 02/04/13 0634 02/05/13 0508  INR 1.30 1.76* 2.11*   Lab Results  Component Value Date   INR 2.11* 02/05/2013   INR 1.76* 02/04/2013   INR 1.30 02/03/2013   Lab Results  Component Value Date   WBC 8.8 02/01/2013    Estimated Creatinine Clearance: 136.7 ml/min (by C-G formula based on Cr of 0.77).  Medications:  Scheduled:  . bisoprolol-hydrochlorothiazide  1 tablet Oral Daily  . divalproex  500 mg Oral Daily  . nicotine  21 mg Transdermal Q24H  . sertraline  50 mg Oral Daily  . topiramate  15 mg Oral BID  . vancomycin  1,000 mg Intravenous Q8H  . warfarin   Does not apply Once  . Warfarin - Pharmacist Dosing Inpatient   Does not apply q1800    Assessment:  47 year old male receiving Coumadin for VTE prophylaxis s/p ORIF.   INR at clinical goal 2.11. No bleeding complications noted   Vancomycin continuing at 1 gm IV q 8 hours [dose change].  Afebrile  Goal of Therapy:  INR 2-3  Vancomycin trough 15 - 20 mcg/ml   Plan:   Will check Vancomycin trough this evening if still an inpatient  Coumadin 10 mg po daily. [Recommended Coumadin discharge dose as of now]  If discharged, recommend next INR Monday, 02/08/13.  Palmyra Rogacki, Elisha Headland, Pharm.D.  02/05/2013,7:56 AM

## 2013-02-05 NOTE — Progress Notes (Signed)
Patient ID: Harold Bailey, male   DOB: 09/09/1966, 47 y.o.   MRN: [id number] Mr. Klutz was seen today and is doing well.  Can be discharged to SNF today.

## 2013-02-05 NOTE — Discharge Summary (Signed)
Patient ID: Harold Bailey MRN: [id number] DOB/AGE: 1965-12-21 47 y.o.  Admit date: 02/01/2013 Discharge date: 02/05/2013  Admission Diagnoses:  Active Problems:   * No active hospital problems. *   Discharge Diagnoses:  Same  Past Medical History  Diagnosis Date  . Hypertension   . Shortness of breath     Hx of smoking.  Marland Kitchen GERD (gastroesophageal reflux disease)   . 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  . Arthritis     Left ankle  . Polycythemia   . Anxiety   . 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 male who was admitted 02/01/2013 for operative treatment of<principal problem not specified>. Patient has severe unremitting pain that affects sleep, daily activities, and work/hobbies. After pre-op clearance the patient was taken to the operating room on * No surgery found * and underwent  .    Patient was given perioperative antibiotics: Anti-infectives   Start     Dose/Rate Route Frequency Ordered Stop   02/05/13 0000  vancomycin (VANCOCIN) 1 GM/200ML SOLN     1,000 mg 200 mL/hr over 60 Minutes Intravenous Every 8 hours 02/05/13 0654     02/04/13 0300  vancomycin (VANCOCIN) IVPB 1000 mg/200 mL premix     1,000 mg 200 mL/hr over 60 Minutes Intravenous Every 8 hours 02/03/13 2105     02/03/13 0000  sodium chloride 0.9 % SOLN 250 mL with vancomycin 10 G SOLR 1,250 mg     1,250 mg 166.7 mL/hr over 90 Minutes Intravenous Every 8 hours 02/03/13 0655     02/02/13 1824  vancomycin (VANCOCIN) 1,250 mg in sodium chloride 0.9 % 250 mL IVPB  Status:  Discontinued     1,250 mg 166.7 mL/hr over 90 Minutes Intravenous Every 8 hours 02/02/13 1123 02/03/13 2105   02/01/13 2200  vancomycin (VANCOCIN) injection 1,250 mg   Status:  Discontinued     1,250 mg Intravenous 3 times per day 02/01/13 1749 02/01/13 1808   02/01/13 2000  vancomycin (VANCOCIN) 1,250 mg in sodium chloride 0.9 % 250 mL IVPB  Status:  Discontinued     1,250 mg 166.7 mL/hr over 90 Minutes Intravenous Every 12 hours 02/01/13 1817 02/02/13 1123   02/01/13 1800  vancomycin (VANCOCIN) IVPB 1000 mg/200 mL premix  Status:  Discontinued     1,000 mg 200 mL/hr over 60 Minutes Intravenous Every 12 hours 02/01/13 1749 02/01/13 1806       Patient was given sequential compression devices, early ambulation, and chemoprophylaxis to prevent DVT.  Patient benefited maximally from hospital stay and there were no complications.    Recent vital signs: Patient Vitals for the past 24 hrs:  BP Temp Temp src Pulse Resp SpO2  02/05/13 0513 92/56 mmHg 98.1 F (36.7 C) Oral 62 18 97 %  02/04/13 2111 130/63 mmHg 97.9 F (36.6 C) Oral 71 19 98 %  02/04/13 1346 154/98 mmHg 97.5 F (36.4 C) - 62 20 96 %     Recent laboratory studies:  Recent Labs  02/04/13 0634 02/05/13 0508  INR 1.76* 2.11*     Discharge Medications:     Medication List    TAKE these  medications       ALFALFA PO  Take 4 capsules by mouth daily.     ALPRAZolam 1 MG tablet  Commonly known as:  XANAX  Take 1-2 mg by mouth at bedtime as needed for sleep or anxiety.     aspirin EC 325 MG tablet  Take 1 tablet (325 mg total) by mouth daily.     bisoprolol-hydrochlorothiazide 10-6.25 MG per tablet  Commonly known as:  ZIAC  Take 1 tablet by mouth daily.     diclofenac sodium 1 % Gel  Commonly known as:  VOLTAREN  Apply 2 g topically 4 (four) times daily.     divalproex 500 MG 24 hr tablet  Commonly known as:  DEPAKOTE ER  Take 500 mg by mouth daily.     fluticasone 50 MCG/ACT nasal spray  Commonly known as:  FLONASE  Place 2 sprays into the nose daily as needed for rhinitis or allergies.     HYDROcodone-acetaminophen 10-325 MG per tablet  Commonly known as:  NORCO   Take 1 tablet by mouth every 4 (four) hours as needed.     HYDROcodone-acetaminophen 10-325 MG per tablet  Commonly known as:  NORCO  Take 1 tablet by mouth 2 (two) times daily as needed for pain.     multivitamin with minerals Tabs  Take 1 tablet by mouth daily.     nicotine 21 mg/24hr patch  Commonly known as:  NICODERM CQ - dosed in mg/24 hours  Place 1 patch onto the skin daily.     nicotine 21 mg/24hr patch  Commonly known as:  NICODERM CQ - dosed in mg/24 hours  Place 1 patch onto the skin daily.     sertraline 50 MG tablet  Commonly known as:  ZOLOFT  Take 50 mg by mouth daily.     sodium chloride 0.9 % SOLN 250 mL with vancomycin 10 G SOLR 1,250 mg  Inject 1,250 mg into the vein every 8 (eight) hours.     topiramate 15 MG capsule  Commonly known as:  TOPAMAX  Take 15 mg by mouth 2 (two) times daily.     vancomycin 1 GM/200ML Soln  Commonly known as:  VANCOCIN  Inject 200 mLs (1,000 mg total) into the vein every 8 (eight) hours.     vancomycin 10 G Solr  Commonly known as:  VANCOCIN  Inject 1,250 mg into the vein every 8 (eight) hours.        Diagnostic Studies: No results found.  Disposition:  To skilled nursing facility      Discharge Orders   Future Orders Complete By Expires     Apply cam walker  As directed     Questions:      Laterality:  Left    Call MD / Call 911  As directed     Comments:      If you experience chest pain or shortness of breath, CALL 911 and be transported to the hospital emergency room.  If you develope a fever above 101 F, pus (white drainage) or increased drainage or redness at the wound, or calf pain, call your surgeon's office.    Call MD / Call 911  As directed     Comments:      If you experience chest pain or shortness of breath, CALL 911 and be transported to the hospital emergency room.  If you develope a fever above 101 F, pus (white drainage) or increased drainage or redness at the wound,  or calf pain, call your  surgeon's office.    Constipation Prevention  As directed     Comments:      Drink plenty of fluids.  Prune juice may be helpful.  You may use a stool softener, such as Colace (over the counter) 100 mg twice a day.  Use MiraLax (over the counter) for constipation as needed.    Constipation Prevention  As directed     Comments:      Drink plenty of fluids.  Prune juice may be helpful.  You may use a stool softener, such as Colace (over the counter) 100 mg twice a day.  Use MiraLax (over the counter) for constipation as needed.    Diet - low sodium heart healthy  As directed     Diet - low sodium heart healthy  As directed     Discharge patient  As directed     Discharge patient  As directed     Increase activity slowly as tolerated  As directed     Increase activity slowly as tolerated  As directed     Non weight bearing  As directed     Scheduling Instructions:      Nonweightbearing right hand and left foot. May weight-bear through right forearm with platform walker.       Follow-up Information   Follow up with DUDA,MARCUS V, MD In 2 weeks.   Contact information:   [address] [place] [address] [phone]        Signed: Kathryne Hitch 02/05/2013, 10:04 AM

## 2013-07-27 ENCOUNTER — Telehealth: Payer: Self-pay | Admitting: Internal Medicine

## 2013-07-27 ENCOUNTER — Telehealth: Payer: Self-pay

## 2013-07-27 NOTE — Telephone Encounter (Signed)
Xanax 1mg  called to [facility] 220 [phone], One tablet three times daily, #90 , 0 refills patient advised unable to pick up until 11/8

## 2013-07-27 NOTE — Telephone Encounter (Signed)
Last ov  06/21/13    Last rf on xanax 06/28/13

## 2013-07-27 NOTE — Telephone Encounter (Signed)
Patient informed that his Xanax was called in but he will not be able to pick up until 11/8, questioned why he has to wait , advised last fill / pick up was 10/8 can only get every 30 days, says he will discuss at next office visit.

## 2013-07-27 NOTE — Telephone Encounter (Signed)
Please get patient's chart or review when patient last got the medication from the pharmacy.

## 2013-07-27 NOTE — Telephone Encounter (Signed)
Patient can not pick up Xanax until 06/30/13 but has been called in, please call and inform patient.

## 2013-08-02 ENCOUNTER — Other Ambulatory Visit: Payer: Self-pay | Admitting: Internal Medicine

## 2013-08-02 MED ORDER — FLUTICASONE PROPIONATE 50 MCG/ACT NA SUSP: 2.0000 | Freq: Every day | NASAL | Status: DC | PRN

## 2013-08-11 ENCOUNTER — Encounter: Payer: Self-pay | Admitting: Internal Medicine

## 2013-08-11 DIAGNOSIS — R7303 Prediabetes: Secondary | ICD-10-CM | POA: Insufficient documentation

## 2013-08-11 DIAGNOSIS — E785 Hyperlipidemia, unspecified: Secondary | ICD-10-CM | POA: Insufficient documentation

## 2013-08-11 DIAGNOSIS — M1990 Unspecified osteoarthritis, unspecified site: Secondary | ICD-10-CM | POA: Insufficient documentation

## 2013-08-11 DIAGNOSIS — Z9889 Other specified postprocedural states: Secondary | ICD-10-CM | POA: Insufficient documentation

## 2013-08-11 DIAGNOSIS — K219 Gastro-esophageal reflux disease without esophagitis: Secondary | ICD-10-CM | POA: Insufficient documentation

## 2013-08-12 ENCOUNTER — Encounter: Payer: Self-pay | Admitting: Physician Assistant

## 2013-08-12 ENCOUNTER — Ambulatory Visit: Payer: Medicare Other | Admitting: Physician Assistant

## 2013-08-12 VITALS — BP 138/80 | HR 110 | Temp 98.9°F | Resp 16 | Ht 72.5 in | Wt 224.0 lb

## 2013-08-12 DIAGNOSIS — R413 Other amnesia: Secondary | ICD-10-CM

## 2013-08-12 DIAGNOSIS — F419 Anxiety disorder, unspecified: Secondary | ICD-10-CM

## 2013-08-12 DIAGNOSIS — Z125 Encounter for screening for malignant neoplasm of prostate: Secondary | ICD-10-CM

## 2013-08-12 DIAGNOSIS — D751 Secondary polycythemia: Secondary | ICD-10-CM | POA: Insufficient documentation

## 2013-08-12 DIAGNOSIS — M1990 Unspecified osteoarthritis, unspecified site: Secondary | ICD-10-CM

## 2013-08-12 DIAGNOSIS — Z1212 Encounter for screening for malignant neoplasm of rectum: Secondary | ICD-10-CM

## 2013-08-12 DIAGNOSIS — E559 Vitamin D deficiency, unspecified: Secondary | ICD-10-CM | POA: Insufficient documentation

## 2013-08-12 DIAGNOSIS — F1911 Other psychoactive substance abuse, in remission: Secondary | ICD-10-CM | POA: Insufficient documentation

## 2013-08-12 DIAGNOSIS — R7303 Prediabetes: Secondary | ICD-10-CM

## 2013-08-12 DIAGNOSIS — K219 Gastro-esophageal reflux disease without esophagitis: Secondary | ICD-10-CM

## 2013-08-12 DIAGNOSIS — E785 Hyperlipidemia, unspecified: Secondary | ICD-10-CM

## 2013-08-12 DIAGNOSIS — I10 Essential (primary) hypertension: Secondary | ICD-10-CM

## 2013-08-12 DIAGNOSIS — F1910 Other psychoactive substance abuse, uncomplicated: Secondary | ICD-10-CM

## 2013-08-12 LAB — BASIC METABOLIC PANEL WITH GFR
Calcium: 9.6 mg/dL (ref 8.4–10.5)
GFR, Est African American: >89 mL/min
GFR, Est Non African American: >89 mL/min
Sodium: 141 mEq/L (ref 135–145)

## 2013-08-12 LAB — CBC WITH DIFFERENTIAL/PLATELET
Basophils Relative: 0 % (ref 0–1)
Eosinophils Absolute: 0.4 10*3/uL (ref 0.0–0.7)
MCH: 30.5 pg (ref 26.0–34.0)
MCHC: 34.8 g/dL (ref 30.0–36.0)
Neutrophils Relative %: 68 % (ref 43–77)
Platelets: 205 10*3/uL (ref 150–400)
RBC: 5.61 MIL/uL (ref 4.22–5.81)

## 2013-08-12 LAB — HEPATIC FUNCTION PANEL
Bilirubin, Direct: 0.1 mg/dL (ref 0.0–0.3)
Total Bilirubin: 0.4 mg/dL (ref 0.3–1.2)

## 2013-08-12 LAB — LIPID PANEL
Cholesterol: 238 mg/dL — ABNORMAL HIGH (ref 0–200)
Total CHOL/HDL Ratio: 6.8 Ratio
Triglycerides: 449 mg/dL — ABNORMAL HIGH (ref <150)

## 2013-08-12 LAB — HEMOGLOBIN A1C: Mean Plasma Glucose: 111 mg/dL (ref <117)

## 2013-08-12 MED ORDER — ATENOLOL 100 MG PO TABS: ORAL_TABLET | ORAL | Status: DC

## 2013-08-12 MED ORDER — ATENOLOL 100 MG PO TABS: 100.0000 mg | ORAL_TABLET | Freq: Every day | ORAL | Status: DC

## 2013-08-12 MED ORDER — ALPRAZOLAM 1 MG PO TABS: 1.0000 mg | ORAL_TABLET | Freq: Three times a day (TID) | ORAL | Status: DC | PRN

## 2013-08-12 NOTE — Progress Notes (Signed)
Complete Physical HPI Patient presents for complete physical.   Patient's blood pressure has been controlled at home. Patient denies chest pain, shortness of breath, dizziness. Patient's cholesterol is diet controlled. The patient's cholesterol last visit was LDL97. The patient has been working on diet and exercise for prediabetes, denies changes in vision, polys, and paresthesias. Last A1C was 5.3.  Recently went to jail for 30 days and got out sept 11 2014 and states he did not have any of his medications in jail and wishes to not take them anymore since he did fine without them.  He broke his hand in the hospital and had surgery for that, had surgery again on his ankle which got MRSA and then had surgery again.  Has had some diarrhea and upset stomach for 3-4 days and currently is tachy. States that he has not been eating well.  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 Allene Pyo office and he should not come which I believe there was a miscommunication.    Current Medications:  Current Outpatient Prescriptions on File Prior to Visit  Medication Sig Dispense Refill  . ALPRAZolam (XANAX) 1 MG tablet Take 1-2 mg by mouth at bedtime as needed for sleep or anxiety.       Marland Kitchen HYDROcodone-acetaminophen (NORCO) 10-325 MG per tablet Take 1 tablet by mouth every 4 (four) hours as needed.  60 tablet  0  . nicotine (NICODERM CQ - DOSED IN MG/24 HOURS) 21 mg/24hr patch Place 1 patch onto the skin daily.  28 patch  0  . fluticasone (FLONASE) 50 MCG/ACT nasal spray Place 2 sprays into both nostrils daily as needed for rhinitis or allergies.  1 g  3  . Multiple Vitamin (MULTIVITAMIN WITH MINERALS) TABS Take 1 tablet by mouth daily.      . sertraline (ZOLOFT) 50 MG tablet Take 50 mg by mouth daily.       No current facility-administered medications on file prior to visit.   Health Maintenance:   Tetanus: believes he got it with one of his surgeries Pneumovax: thinks he may have gotten Flu vaccine: 07/06/2013 Zostavax: N/A Colonoscopy: N/A EGD: N/A Allergies: No Known Allergies Medical History:  Past Medical History  Diagnosis Date  . Shortness of breath     Hx of smoking.  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  . Polycythemia   . 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 Hypertension   . GERD (gastroesophageal reflux disease)   . Anxiety   . Arthritis     Left ankle  . Hyperlipidemia   . Prediabetes   . Drug abuse    Surgical History:  Past Surgical History  Procedure Laterality Date  . Ankle surgery  2003    Left  . Shoulder arthroscopy  2008    Left  . Brain surgery  2009    Biopsy  . Fracture surgery  2003    Left ankle  . Tympanostomy tube placement  1993  . Ankle arthroscopy  01/17/2012    Procedure: ANKLE ARTHROSCOPY;  Surgeon: Nadara Mustard, MD;  Location: [facility] OR;  Service: Orthopedics;  Laterality: Left;  . Inner ear surgery    .  Ankle fusion Left 11/2012    Dr Lajoyce Corners  . Orif ankle fracture Left 12/08/2012    Procedure: OPEN REDUCTION INTERNAL FIXATION (ORIF) ANKLE FRACTURE;  Surgeon: Nadara Mustard, MD;  Location: [facility] OR;  Service: Orthopedics;  Laterality: Left;  Removal Deep Hardware, Take Down Non-Union, Revision Internal Fixation Left Ankle  . Hardware removal Left 12/08/2012    Procedure: HARDWARE REMOVAL;  Surgeon: Nadara Mustard, MD;  Location: [facility] OR;  Service: Orthopedics;  Laterality: Left;  Removal Deep Hardware, Take Down Non-Union, Revision Internal Fixation Left Ankle  . I&d extremity Left 01/01/2013    Procedure: IRRIGATION AND DEBRIDEMENT EXTREMITY;  Surgeon: Nadara Mustard, MD;  Location: [facility] OR;  Service: Orthopedics;  Laterality: Left;  Irrigation, Debridement and Placement Antibiotic Beads Left Ankle  . Open  reduction internal fixation (orif) scaphoid with distal radius graft Right 02/01/2013    Procedure: OPEN REDUCTION INTERNAL FIXATION (ORIF) RIGHT SCAPHOID FRACTURE WITH DISTAL RADIUS GRAFT;  Surgeon: Marlowe Shores, MD;  Location: [facility] OR;  Service: Orthopedics;  Laterality: Right;  . Scaphoid fracture surgery  02/04/2013   Family History:  Family History  Problem Relation Age of Onset  . Hypertension Mother    Social History:  History   Social History  . Marital Status: Married    Spouse Name: N/A    Number of Children: N/A  . Years of Education: N/A   Occupational History  . Not on file.   Social History Main Topics  . Smoking status: Former Smoker -- 1.00 packs/day for 30 years    Types: Cigarettes    Quit date: 01/30/2013  . Smokeless tobacco: Never Used  . Alcohol Use: No     Comment: quit 2013  . Drug Use: No  . Sexual Activity: Not on file   Other Topics Concern  . Not on file   Social History Narrative  . No narrative on file   ROS Constitutional: Denies weight loss/gain, headaches, insomnia, fatigue, night sweats, and change in appetite. Eyes: Denies redness, blurred vision, diplopia, discharge, itchy, watery eyes.  ENT: Denies discharge, congestion, post nasal drip, sore throat, earache, hearing loss, dental pain, Tinnitus, Vertigo, Sinus pain, snoring.  Cardio: + palpitations for 1-2 years but worse past 6 months denies accompaniments Denies chest pain, irregular heartbeat, dyspnea, diaphoresis, orthopnea, PND, claudication, edema Respiratory: denies cough, dyspnea, pleurisy, hoarseness, wheezing.  Gastrointestinal: + diarrhea for 3-4 days no blood Denies dysphagia, heartburn, pain, cramps, nausea, vomiting, bloating, constipation, hematemesis, melena, hematochezia, hemorrhoids Genitourinary: Denies dysuria, frequency, urgency, nocturia, hesitancy, discharge, hematuria, flank pain Musculoskeletal: + chronic pain Denies arthralgia, myalgia, stiffness, Jt.  Swelling, pain, Skin: Denies pruritis, rash, hives,  acne, eczema, changing in skin lesion Neuro: Weakness, tremor, incoordination, spasms, paresthesia, pain Psychiatric: Denies confusion, memory loss, sensory loss Endocrine: Denies change in weight, skin, hair change, nocturia, and paresthesia, Diabetic Polys, visual blurring, hyper /hypo glycemic episodes.  Heme/Lymph: Excessive bleeding, bruising, enlarged lymph nodes  Physical Exam: Estimated body mass index is 29.95 kg/(m^2) as calculated from the following:   Height as of this encounter: 6' 0.5" (1.842 m).   Weight as of this encounter: 224 lb (101.606 kg). Filed Vitals:   08/12/13 1417  BP: 138/80  Pulse: 110  Temp: 98.9 F (37.2 C)  Resp: 16   General Appearance: Well nourished, in no apparent distress. Eyes: PERRLA, EOMs, conjunctiva no swelling or erythema, normal fundi and vessels. Sinuses: No Frontal/maxillary tenderness ENT/Mouth: Ext aud canals clear, normal light reflex with TMs without  erythema, bulging.  Good dentition. No erythema, swelling, or exudate on post pharynx. Tonsils not swollen or erythematous. Hearing normal.  Neck: Supple, thyroid normal. No bruits Respiratory: Respiratory effort normal, BS equal bilaterally without rales, rhonchi, wheezing or stridor. Cardio: Tachy- Heart sounds normal, regular rate and rhythm without murmurs, rubs or gallops. Peripheral pulses brisk and equal bilaterally, without edema.  Chest: symmetric, with normal excursions and percussion. Abdomen: Flat, soft, with bowl sounds. Non tender, no guarding, rebound, hernias, masses, or organomegaly. .  Lymphatics: Non tender without lymphadenopathy.  Genitourinary: defer Musculoskeletal: Full ROM all peripheral extremities,5/5 strength, and left foot with healing lateral scar, slight edema and decreased ROM of left ankle. Skin: Warm, dry without rashes, lesions, ecchymosis.  Neuro: Cranial nerves intact, reflexes equal bilaterally.  Normal muscle tone, no cerebellar symptoms. Sensation intact.  Psych: Awake and oriented X 3, normal affect.   EKG: WNL , sinus tachy no changes.  Assessment and Plan: 1. Hypertension Atenolol 100mg  take 1/2 to 1 at night  - CBC with Differential - BASIC METABOLIC PANEL WITH GFR - Hepatic function panel - TSH - Urinalysis, Routine w reflex microscopic - Microalbumin / creatinine urine ratio - EKG 12-Lead  2. GERD (gastroesophageal reflux disease) - controlled with diet  3. Prediabetes Discussed diet - Hemoglobin A1c - Insulin, fasting  4. Arthritis Continue to see Dr. Lajoyce Corners  5. Anxiety - continue Xanax- last fillled Jul 10 2013- # 90 NR  6. Hyperlipidemia - Lipid panel  7. Palpitations Check labs Atenolol 100mg  start 1/2 at night then can go to one at night.   9. Polycythemia Check CBC  10. Memory deficit Follow up with neuro  11. Drug abuse Continue rehab and explained he will not get any pain medications from this office.     Quentin Mulling 2:23 PM

## 2013-08-12 NOTE — Patient Instructions (Signed)
Fat and Cholesterol Control Diet  Fat and cholesterol levels in your blood and organs are influenced by your diet. High levels of fat and cholesterol may lead to diseases of the heart, small and large blood vessels, gallbladder, liver, and pancreas.  CONTROLLING FAT AND CHOLESTEROL WITH DIET  Although exercise and lifestyle factors are important, your diet is key. That is because certain foods are known to raise cholesterol and others to lower it. The goal is to balance foods for their effect on cholesterol and more importantly, to replace saturated and trans fat with other types of fat, such as monounsaturated fat, polyunsaturated fat, and omega-3 fatty acids.  On average, a person should consume no more than 15 to 17 g of saturated fat daily. Saturated and trans fats are considered "bad" fats, and they will raise LDL cholesterol. Saturated fats are primarily found in animal products such as meats, butter, and cream. However, that does not mean you need to give up all your favorite foods. Today, there are good tasting, low-fat, low-cholesterol substitutes for most of the things you like to eat. Choose low-fat or nonfat alternatives. Choose round or loin cuts of red meat. These types of cuts are lowest in fat and cholesterol. Chicken (without the skin), fish, veal, and ground turkey breast are great choices. Eliminate fatty meats, such as hot dogs and salami. Even shellfish have little or no saturated fat. Have a 3 oz (85 g) portion when you eat lean meat, poultry, or fish.  Trans fats are also called "partially hydrogenated oils." They are oils that have been scientifically manipulated so that they are solid at room temperature resulting in a longer shelf life and improved taste and texture of foods in which they are added. Trans fats are found in stick margarine, some tub margarines, cookies, crackers, and baked goods.   When baking and cooking, oils are a great substitute for butter. The monounsaturated oils are  especially beneficial since it is believed they lower LDL and raise HDL. The oils you should avoid entirely are saturated tropical oils, such as coconut and palm.   Remember to eat a lot from food groups that are naturally free of saturated and trans fat, including fish, fruit, vegetables, beans, grains (barley, rice, couscous, bulgur wheat), and pasta (without cream sauces).   IDENTIFYING FOODS THAT LOWER FAT AND CHOLESTEROL   Soluble fiber may lower your cholesterol. This type of fiber is found in fruits such as apples, vegetables such as broccoli, potatoes, and carrots, legumes such as beans, peas, and lentils, and grains such as barley. Foods fortified with plant sterols (phytosterol) may also lower cholesterol. You should eat at least 2 g per day of these foods for a cholesterol lowering effect.   Read package labels to identify low-saturated fats, trans fat free, and low-fat foods at the supermarket. Select cheeses that have only 2 to 3 g saturated fat per ounce. Use a heart-healthy tub margarine that is free of trans fats or partially hydrogenated oil. When buying baked goods (cookies, crackers), avoid partially hydrogenated oils. Breads and muffins should be made from whole grains (whole-wheat or whole oat flour, instead of "flour" or "enriched flour"). Buy non-creamy canned soups with reduced salt and no added fats.   FOOD PREPARATION TECHNIQUES   Never deep-fry. If you must fry, either stir-fry, which uses very little fat, or use non-stick cooking sprays. When possible, broil, bake, or roast meats, and steam vegetables. Instead of putting butter or margarine on vegetables, use lemon   and herbs, applesauce, and cinnamon (for squash and sweet potatoes). Use nonfat yogurt, salsa, and low-fat dressings for salads.   LOW-SATURATED FAT / LOW-FAT FOOD SUBSTITUTES  Meats / Saturated Fat (g)  · Avoid: Steak, marbled (3 oz/85 g) / 11 g  · Choose: Steak, lean (3 oz/85 g) / 4 g  · Avoid: Hamburger (3 oz/85 g) / 7  g  · Choose: Hamburger, lean (3 oz/85 g) / 5 g  · Avoid: Ham (3 oz/85 g) / 6 g  · Choose: Ham, lean cut (3 oz/85 g) / 2.4 g  · Avoid: Chicken, with skin, dark meat (3 oz/85 g) / 4 g  · Choose: Chicken, skin removed, dark meat (3 oz/85 g) / 2 g  · Avoid: Chicken, with skin, light meat (3 oz/85 g) / 2.5 g  · Choose: Chicken, skin removed, light meat (3 oz/85 g) / 1 g  Dairy / Saturated Fat (g)  · Avoid: Whole milk (1 cup) / 5 g  · Choose: Low-fat milk, 2% (1 cup) / 3 g  · Choose: Low-fat milk, 1% (1 cup) / 1.5 g  · Choose: Skim milk (1 cup) / 0.3 g  · Avoid: Hard cheese (1 oz/28 g) / 6 g  · Choose: Skim milk cheese (1 oz/28 g) / 2 to 3 g  · Avoid: Cottage cheese, 4% fat (1 cup) / 6.5 g  · Choose: Low-fat cottage cheese, 1% fat (1 cup) / 1.5 g  · Avoid: Ice cream (1 cup) / 9 g  · Choose: Sherbet (1 cup) / 2.5 g  · Choose: Nonfat frozen yogurt (1 cup) / 0.3 g  · Choose: Frozen fruit bar / trace  · Avoid: Whipped cream (1 tbs) / 3.5 g  · Choose: Nondairy whipped topping (1 tbs) / 1 g  Condiments / Saturated Fat (g)  · Avoid: Mayonnaise (1 tbs) / 2 g  · Choose: Low-fat mayonnaise (1 tbs) / 1 g  · Avoid: Butter (1 tbs) / 7 g  · Choose: Extra light margarine (1 tbs) / 1 g  · Avoid: Coconut oil (1 tbs) / 11.8 g  · Choose: Olive oil (1 tbs) / 1.8 g  · Choose: Corn oil (1 tbs) / 1.7 g  · Choose: Safflower oil (1 tbs) / 1.2 g  · Choose: Sunflower oil (1 tbs) / 1.4 g  · Choose: Soybean oil (1 tbs) / 2.4 g  · Choose: Canola oil (1 tbs) / 1 g  Document Released: 09/09/2005 Document Revised: 01/04/2013 Document Reviewed: 02/28/2011  [employer]® Patient Information ©2014 [employer].

## 2013-08-13 ENCOUNTER — Other Ambulatory Visit: Payer: Self-pay | Admitting: Physician Assistant

## 2013-08-13 ENCOUNTER — Encounter: Payer: Self-pay | Admitting: Internal Medicine

## 2013-08-13 ENCOUNTER — Telehealth: Payer: Self-pay | Admitting: Internal Medicine

## 2013-08-13 DIAGNOSIS — K625 Hemorrhage of anus and rectum: Secondary | ICD-10-CM

## 2013-08-13 LAB — URINALYSIS, ROUTINE W REFLEX MICROSCOPIC
Bilirubin Urine: NEGATIVE
Glucose, UA: NEGATIVE mg/dL
Specific Gravity, Urine: 1.02 (ref 1.005–1.030)
Urobilinogen, UA: 0.2 mg/dL (ref 0.0–1.0)

## 2013-08-13 LAB — MICROALBUMIN / CREATININE URINE RATIO
Creatinine, Urine: 153 mg/dL
Microalb, Ur: 1.25 mg/dL (ref 0.00–1.89)

## 2013-08-13 NOTE — Telephone Encounter (Signed)
I did not suggest a high dose Omega. If he is referring to fish oil for his cholesterol he can take fish oil 1000mg  two or three times a day. The Atenolol for his heart rate will not cause bloody stool so please continue the atenolol, will refer to GI.

## 2013-08-13 NOTE — Telephone Encounter (Signed)
Patient advised per Marchelle Folks fish oil 1000mg  OTC two to three times daily and Atenolo will not be cause of bloody stool, patient aware of GI referral to be done

## 2013-08-13 NOTE — Telephone Encounter (Signed)
Pt called and said that medicine you gave him at Thursday,August 12, 2013 office visit, may have caused him to have rectal bleeding, and he is concerned.  Please advise. Pt said pharm. Did not have the high level of omega that you suggested to him, can he have a rx for this.  Pharm- [facility] [place] at [phone]

## 2013-08-24 ENCOUNTER — Other Ambulatory Visit: Payer: Self-pay | Admitting: Physician Assistant

## 2013-08-28 ENCOUNTER — Emergency Department (HOSPITAL_COMMUNITY): Payer: Medicare Other

## 2013-08-28 ENCOUNTER — Encounter (HOSPITAL_COMMUNITY): Payer: Self-pay | Admitting: Emergency Medicine

## 2013-08-28 ENCOUNTER — Emergency Department (HOSPITAL_COMMUNITY)
Admission: EM | Admit: 2013-08-28 | Discharge: 2013-08-28 | Disposition: A | Discharge status: Home / Self Care | Payer: Medicare Other | Attending: Emergency Medicine | Admitting: Emergency Medicine

## 2013-08-28 DIAGNOSIS — S40029A Contusion of unspecified upper arm, initial encounter: Secondary | ICD-10-CM | POA: Insufficient documentation

## 2013-08-28 DIAGNOSIS — Z862 Personal history of diseases of the blood and blood-forming organs and certain disorders involving the immune mechanism: Secondary | ICD-10-CM | POA: Insufficient documentation

## 2013-08-28 DIAGNOSIS — S20219A Contusion of unspecified front wall of thorax, initial encounter: Secondary | ICD-10-CM | POA: Insufficient documentation

## 2013-08-28 DIAGNOSIS — S8263XA Displaced fracture of lateral malleolus of unspecified fibula, initial encounter for closed fracture: Secondary | ICD-10-CM | POA: Insufficient documentation

## 2013-08-28 DIAGNOSIS — Z87891 Personal history of nicotine dependence: Secondary | ICD-10-CM | POA: Insufficient documentation

## 2013-08-28 DIAGNOSIS — Y939 Activity, unspecified: Secondary | ICD-10-CM | POA: Insufficient documentation

## 2013-08-28 DIAGNOSIS — T07XXXA Unspecified multiple injuries, initial encounter: Secondary | ICD-10-CM

## 2013-08-28 DIAGNOSIS — S20212A Contusion of left front wall of thorax, initial encounter: Secondary | ICD-10-CM

## 2013-08-28 DIAGNOSIS — Z79899 Other long term (current) drug therapy: Secondary | ICD-10-CM | POA: Insufficient documentation

## 2013-08-28 DIAGNOSIS — R296 Repeated falls: Secondary | ICD-10-CM | POA: Insufficient documentation

## 2013-08-28 DIAGNOSIS — Z8669 Personal history of other diseases of the nervous system and sense organs: Secondary | ICD-10-CM | POA: Insufficient documentation

## 2013-08-28 DIAGNOSIS — Z8639 Personal history of other endocrine, nutritional and metabolic disease: Secondary | ICD-10-CM | POA: Insufficient documentation

## 2013-08-28 DIAGNOSIS — Z8719 Personal history of other diseases of the digestive system: Secondary | ICD-10-CM | POA: Insufficient documentation

## 2013-08-28 DIAGNOSIS — F411 Generalized anxiety disorder: Secondary | ICD-10-CM | POA: Insufficient documentation

## 2013-08-28 DIAGNOSIS — I10 Essential (primary) hypertension: Secondary | ICD-10-CM | POA: Insufficient documentation

## 2013-08-28 DIAGNOSIS — M19079 Primary osteoarthritis, unspecified ankle and foot: Secondary | ICD-10-CM | POA: Insufficient documentation

## 2013-08-28 DIAGNOSIS — Y929 Unspecified place or not applicable: Secondary | ICD-10-CM | POA: Insufficient documentation

## 2013-08-28 DIAGNOSIS — S82891A Other fracture of right lower leg, initial encounter for closed fracture: Secondary | ICD-10-CM

## 2013-08-28 DIAGNOSIS — S8010XA Contusion of unspecified lower leg, initial encounter: Secondary | ICD-10-CM | POA: Insufficient documentation

## 2013-08-28 MED ORDER — IBUPROFEN 800 MG PO TABS: 800.0000 mg | ORAL_TABLET | Freq: Three times a day (TID) | ORAL | Status: DC

## 2013-08-28 MED ORDER — OXYCODONE-ACETAMINOPHEN 5-325 MG PO TABS: 1.0000 | ORAL_TABLET | ORAL | Status: DC | PRN

## 2013-08-28 MED ORDER — HYDROMORPHONE HCL PF 2 MG/ML IJ SOLN
2.0000 mg | Freq: Once | INTRAMUSCULAR | Status: AC
  Administered 2013-08-28: 2 mg via INTRAMUSCULAR
  Filled 2013-08-28: qty 1

## 2013-08-28 MED ORDER — CYCLOBENZAPRINE HCL 10 MG PO TABS: 10.0000 mg | ORAL_TABLET | Freq: Three times a day (TID) | ORAL | Status: DC | PRN

## 2013-08-28 MED ORDER — OXYCODONE-ACETAMINOPHEN 5-325 MG PO TABS
2.0000 | ORAL_TABLET | Freq: Once | ORAL | Status: AC
  Administered 2013-08-28: 2 via ORAL
  Filled 2013-08-28: qty 2

## 2013-08-28 NOTE — ED Provider Notes (Signed)
  Medical screening examination/treatment/procedure(s) were performed by non-physician practitioner and as supervising physician I was immediately available for consultation/collaboration.  EKG Interpretation   None          Gerhard Munch, MD 08/29/13 0000

## 2013-08-28 NOTE — Progress Notes (Signed)
Orthopedic Tech Progress Note Patient Details:  Harold Bailey [birth year] [id number]  Ortho Devices Type of Ortho Device: CAM walker Ortho Device/Splint Location: rle Ortho Device/Splint Interventions: Application   [name] 08/28/2013, 10:21 PM

## 2013-08-28 NOTE — ED Notes (Signed)
Patient states that he got his dog chain wrapped around his legs and fell and hurt his right ankle.  Patient placed on walking boot for splinting and support.

## 2013-08-28 NOTE — ED Notes (Signed)
Ortho tech at bedside 

## 2013-08-28 NOTE — ED Provider Notes (Signed)
CSN: [id number]     Arrival date & time 08/28/13  1910 History  This chart was scribed for non-physician practitioner Trixie Dredge, PA-C working with Gerhard Munch, MD by Valera Castle, ED scribe. This patient was seen in room TR09C/TR09C and the patient's care was started at 7:41 PM.    Chief Complaint  Patient presents with  . Ankle Pain    The history is provided by the patient and the spouse. No language interpreter was used.   HPI Comments: Harold Bailey is a 47 y.o. male who presents to the Emergency Department complaining of sudden, moderate, constant, right ankle pain and left rib pain, with associated ecchymosis to his arms, ribs, and legs, onset yesterday when he fell after his dog wrapped his chain around his legs.  He denies head trauma and LOC. He reports taking Ibuprofen for his pain, with some relief. He reports applying ice packs and heating pads to his injuries. He denies any trouble breathing, lacerations, nausea, emesis, neck pain, back pain, hematuria, and any other associated symptoms. He denies h/o clotting and bleeding problems, being on blood thinners.  Has seen Dr Lajoyce Corners for surgery of left ankle previously.   PCP - Nadean Corwin, MD   Past Medical History  Diagnosis Date  . Shortness of breath     Hx of smoking.  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  . Polycythemia   . 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 Hypertension   . GERD (gastroesophageal reflux disease)   . Anxiety   . Arthritis     Left ankle  . Hyperlipidemia   . Prediabetes   . Drug abuse    Past Surgical History  Procedure Laterality Date  . Ankle surgery  2003    Left  . Shoulder arthroscopy  2008    Left  . Brain surgery  2009    Biopsy  . Fracture surgery  2003    Left ankle  . Tympanostomy tube placement  1993  . Ankle arthroscopy   01/17/2012    Procedure: ANKLE ARTHROSCOPY;  Surgeon: Nadara Mustard, MD;  Location: [facility] OR;  Service: Orthopedics;  Laterality: Left;  . Inner ear surgery    . Ankle fusion Left 11/2012    Dr Lajoyce Corners  . Orif ankle fracture Left 12/08/2012    Procedure: OPEN REDUCTION INTERNAL FIXATION (ORIF) ANKLE FRACTURE;  Surgeon: Nadara Mustard, MD;  Location: [facility] OR;  Service: Orthopedics;  Laterality: Left;  Removal Deep Hardware, Take Down Non-Union, Revision Internal Fixation Left Ankle  . Hardware removal Left 12/08/2012    Procedure: HARDWARE REMOVAL;  Surgeon: Nadara Mustard, MD;  Location: [facility] OR;  Service: Orthopedics;  Laterality: Left;  Removal Deep Hardware, Take Down Non-Union, Revision Internal Fixation Left Ankle  . I&d extremity Left 01/01/2013    Procedure: IRRIGATION AND DEBRIDEMENT EXTREMITY;  Surgeon: Nadara Mustard, MD;  Location: [facility] OR;  Service: Orthopedics;  Laterality: Left;  Irrigation, Debridement and Placement Antibiotic Beads Left Ankle  . Open reduction internal fixation (orif) scaphoid with distal radius graft Right 02/01/2013    Procedure: OPEN REDUCTION INTERNAL FIXATION (ORIF) RIGHT SCAPHOID FRACTURE WITH DISTAL RADIUS GRAFT;  Surgeon: Marlowe Shores, MD;  Location: [facility] OR;  Service: Orthopedics;  Laterality: Right;  . Scaphoid fracture surgery  02/04/2013  Family History  Problem Relation Age of Onset  . Hypertension Mother    History  Substance Use Topics  . Smoking status: Former Smoker -- 1.00 packs/day for 30 years    Types: Cigarettes    Quit date: 01/30/2013  . Smokeless tobacco: Never Used  . Alcohol Use: No     Comment: quit 2013    Review of Systems  Respiratory: Negative for shortness of breath, wheezing and stridor.   Gastrointestinal: Negative for nausea and vomiting.  Genitourinary: Negative for hematuria.  Musculoskeletal: Positive for arthralgias (right ankle, right leg, ribs) and gait problem. Negative for back pain and neck pain.  Skin: Negative for wound  (negative for lacerations).    Allergies  Review of patient's allergies indicates no known allergies.  Home Medications   Current Outpatient Rx  Name  Route  Sig  Dispense  Refill  . ALPRAZolam (XANAX) 1 MG tablet   Oral   Take 1-2 tablets (1-2 mg total) by mouth 3 (three) times daily as needed for anxiety or sleep.   90 tablet   0   . atenolol (TENORMIN) 100 MG tablet      Take 1/2 to 1 tablet by mouth every night for heart rate.   30 tablet   11   . fluticasone (FLONASE) 50 MCG/ACT nasal spray   Each Nare   Place 2 sprays into both nostrils daily as needed for rhinitis or allergies.   1 g   3   . HYDROcodone-acetaminophen (NORCO) 10-325 MG per tablet   Oral   Take 1 tablet by mouth every 4 (four) hours as needed.   60 tablet   0   . Multiple Vitamin (MULTIVITAMIN WITH MINERALS) TABS   Oral   Take 1 tablet by mouth daily.         . nicotine (NICODERM CQ - DOSED IN MG/24 HOURS) 21 mg/24hr patch   Transdermal   Place 1 patch onto the skin daily.   28 patch   0   . sertraline (ZOLOFT) 50 MG tablet   Oral   Take 50 mg by mouth daily.          Triage Vitals: BP 141/90  Pulse 123  Temp(Src) 97.6 F (36.4 C) (Oral)  Resp 18  Ht 6' (1.829 m)  Wt 214 lb (97.07 kg)  BMI 29.02 kg/m2  SpO2 92%  Physical Exam  Nursing note and vitals reviewed. Constitutional: He appears well-developed and well-nourished. No distress.  HENT:  Head: Normocephalic and atraumatic.  Neck: Neck supple.  Cardiovascular: Normal rate and regular rhythm.   Pulmonary/Chest: Effort normal and breath sounds normal. No respiratory distress. He has no wheezes. He has no rales.  Abdominal: Soft. He exhibits no distension and no mass. There is no tenderness. There is no rebound and no guarding.  Musculoskeletal:       Right shoulder: Normal.       Left shoulder: Normal.       Right elbow: He exhibits normal range of motion, no swelling, no deformity and no laceration. No tenderness  found.       Left elbow: Normal.       Right wrist: Normal.       Left wrist: Normal.       Right ankle: He exhibits decreased range of motion, swelling and ecchymosis. He exhibits no laceration. Tenderness.       Arms:      Right lower leg: He exhibits tenderness, bony tenderness and swelling.  He exhibits no deformity.       Right foot: He exhibits tenderness, bony tenderness and swelling. He exhibits normal range of motion and normal capillary refill.  Neurological: He is alert. He exhibits normal muscle tone.  Skin: He is not diaphoretic.  Bruises noted over medail right elbow, left triceps. Two bruises noted over lower ribs.   Psychiatric: He has a normal mood and affect. His behavior is normal.    ED Course  Procedures (including critical care time)  DIAGNOSTIC STUDIES: Oxygen Saturation is 92% on room air, adequate by my interpretation.    COORDINATION OF CARE: 7:46 PM-Discussed treatment plan which includes which includes right ankle X-ray, right tibia/fibula X-ray, and Percocet with pt at bedside and pt agreed to plan.   Labs Review Labs Reviewed - No data to display Imaging Review Dg Ribs Unilateral W/chest Left  08/28/2013   CLINICAL DATA:  Fall, bruising  EXAM: LEFT RIBS AND CHEST - 3+ VIEW  COMPARISON:  06/08/2012  FINDINGS: Five views left ribs submitted. No acute infiltrate or pulmonary edema. No left rib fracture is identified. No diagnostic pneumothorax.  IMPRESSION: Negative.   Electronically Signed   By: Natasha Mead M.D.   On: 08/28/2013 21:01   Dg Tibia/fibula Right  08/28/2013   CLINICAL DATA:  Left ankle pain and bruising following a fall yesterday.  EXAM: RIGHT TIBIA AND FIBULA - 2 VIEW  COMPARISON:  Left foot and ankle radiographs obtained at the same time.  FINDINGS: Previously described lateral malleolus fracture. 1/2 shaft width of posterior displacement and mild posterior angulation of the distal fragment as well as 1/3 shaft width of lateral displacement of  the distal fragment. There is also lateral subluxation of the talus relative to the tibia. No posterior or medial malleolar fractures are seen.  IMPRESSION: Fracture/subluxation, as described above.   Electronically Signed   By: Gordan Payment M.D.   On: 08/28/2013 20:53   Dg Ankle Complete Right  08/28/2013   CLINICAL DATA:  Ankle pain post fall  EXAM: RIGHT ANKLE - COMPLETE 3+ VIEW  COMPARISON:  None.  FINDINGS: Three views of the right ankle submitted. There is mild displaced fracture of the distal right fibula. There is disruption of the ankle mortise with medial displacement of the distal tibia and widening of tibiotalar space.  IMPRESSION: Mild displaced fracture in distal right fibula. Disruption of the ankle mortise with medial displacement of distal tibia and widening of medial tibiotalar space.   Electronically Signed   By: Natasha Mead M.D.   On: 08/28/2013 20:50   Dg Foot Complete Right  08/28/2013   CLINICAL DATA:  Lateral right ankle pain and bruising following a fall yesterday.  EXAM: RIGHT FOOT COMPLETE - 3+ VIEW  COMPARISON:  Right ankle radiographs obtained at the same time.  FINDINGS: Oblique fracture through the lateral malleolus with 1/2 to 2/3 shaft width of posterior displacement of the distal fragment. Small to moderate-sized inferior calcaneal spur. No additional fractures are seen.  IMPRESSION: Lateral malleolus fracture.   Electronically Signed   By: Gordan Payment M.D.   On: 08/28/2013 20:51    EKG Interpretation   None      9:20 PM Reviewed xray with Dr Jeraldine Loots.   MDM   1. Ankle fracture, right, closed, initial encounter   2. Chest wall contusion, left, initial encounter   3. Multiple contusions     Pt with mechanical fall after dog chain wrapped around his ankle.  Pain and ecchymosis  in right ankle and surrounding area.  Various spots of ecchymosis from being "dragged".  Xrays show fractured ankle with disruption of the ankle mortise and medial displacement of tibia,  widening of medial tibiotalar space.  Discussed patient with Dr Lajoyce Corners, who has operated on patient previously.  Plan is for walking boot, crutches, follow up with Dr Lajoyce Corners in the office on Monday.  Pt d/c home with percocet, ibuprofen, and flexeril.  Pt informed he needs to keep his foot elevated above his heart at all times per my discussion with Dr Lajoyce Corners.  Discussed results, findings, treatment, and follow up  with patient.  Pt given return precautions.  Pt verbalizes understanding and agrees with plan.        I personally performed the services described in this documentation, which was scribed in my presence. The recorded information has been reviewed and is accurate.    Trixie Dredge, PA-C 08/28/13 2339

## 2013-08-28 NOTE — ED Notes (Signed)
PA at bedside.

## 2013-08-31 ENCOUNTER — Other Ambulatory Visit (HOSPITAL_COMMUNITY): Payer: Self-pay | Admitting: Orthopedic Surgery

## 2013-08-31 ENCOUNTER — Encounter (HOSPITAL_COMMUNITY): Payer: Self-pay | Admitting: Respiratory Therapy

## 2013-09-01 ENCOUNTER — Encounter (HOSPITAL_COMMUNITY): Payer: Self-pay | Admitting: *Deleted

## 2013-09-01 NOTE — Progress Notes (Signed)
Pt denies having HTN. Pt made aware to stop celecoxib (CELEBREX) 100 MG capsule, NSAIDS ibuprofen (ADVIL,MOTRIN) 800 MG tablet, Multiple Vitamin (MULTIVITAMIN WITH MINERALS) TABS and omega-3 acid ethyl esters (LOVAZA) 1 G capsule.

## 2013-09-02 MED ORDER — CEFAZOLIN SODIUM-DEXTROSE 2-3 GM-% IV SOLR
2.0000 g | INTRAVENOUS | Status: AC
  Administered 2013-09-03: 2 g via INTRAVENOUS
  Filled 2013-09-02: qty 50

## 2013-09-02 NOTE — Progress Notes (Signed)
Left message for a return call to confirm that he got my message for arrival time of 1200

## 2013-09-03 ENCOUNTER — Encounter (HOSPITAL_COMMUNITY): Payer: Self-pay | Admitting: *Deleted

## 2013-09-03 ENCOUNTER — Encounter (HOSPITAL_COMMUNITY): Payer: Medicare Other | Admitting: Anesthesiology

## 2013-09-03 ENCOUNTER — Ambulatory Visit (HOSPITAL_COMMUNITY): Payer: Medicare Other

## 2013-09-03 ENCOUNTER — Inpatient Hospital Stay (HOSPITAL_COMMUNITY)
Admission: RE | Admit: 2013-09-03 | Discharge: 2013-09-06 | DRG: 494 | Disposition: A | Discharge status: Skilled Nursing Facility | Payer: Medicare Other | Source: Ambulatory Visit | Attending: Orthopedic Surgery | Admitting: Orthopedic Surgery

## 2013-09-03 ENCOUNTER — Encounter (HOSPITAL_COMMUNITY)
Admission: RE | Disposition: A | Discharge status: Skilled Nursing Facility | Payer: Self-pay | Source: Ambulatory Visit | Attending: Orthopedic Surgery

## 2013-09-03 ENCOUNTER — Ambulatory Visit (HOSPITAL_COMMUNITY): Payer: Medicare Other | Admitting: Anesthesiology

## 2013-09-03 DIAGNOSIS — K219 Gastro-esophageal reflux disease without esophagitis: Secondary | ICD-10-CM | POA: Diagnosis present

## 2013-09-03 DIAGNOSIS — S8263XA Displaced fracture of lateral malleolus of unspecified fibula, initial encounter for closed fracture: Principal | ICD-10-CM | POA: Diagnosis present

## 2013-09-03 DIAGNOSIS — E785 Hyperlipidemia, unspecified: Secondary | ICD-10-CM | POA: Diagnosis present

## 2013-09-03 DIAGNOSIS — F411 Generalized anxiety disorder: Secondary | ICD-10-CM | POA: Diagnosis present

## 2013-09-03 DIAGNOSIS — I10 Essential (primary) hypertension: Secondary | ICD-10-CM | POA: Diagnosis present

## 2013-09-03 DIAGNOSIS — Z79899 Other long term (current) drug therapy: Secondary | ICD-10-CM

## 2013-09-03 DIAGNOSIS — W19XXXA Unspecified fall, initial encounter: Secondary | ICD-10-CM | POA: Diagnosis present

## 2013-09-03 DIAGNOSIS — Z8249 Family history of ischemic heart disease and other diseases of the circulatory system: Secondary | ICD-10-CM

## 2013-09-03 DIAGNOSIS — F17200 Nicotine dependence, unspecified, uncomplicated: Secondary | ICD-10-CM | POA: Diagnosis present

## 2013-09-03 DIAGNOSIS — S8261XA Displaced fracture of lateral malleolus of right fibula, initial encounter for closed fracture: Secondary | ICD-10-CM

## 2013-09-03 HISTORY — PX: ORIF ANKLE FRACTURE: SHX5408

## 2013-09-03 LAB — COMPREHENSIVE METABOLIC PANEL
ALT: 92 U/L — ABNORMAL HIGH (ref 0–53)
AST: 77 U/L — ABNORMAL HIGH (ref 0–37)
Alkaline Phosphatase: 85 U/L (ref 39–117)
CO2: 19 mEq/L (ref 19–32)
Chloride: 106 mEq/L (ref 96–112)
Creatinine, Ser: 0.87 mg/dL (ref 0.50–1.35)
GFR calc non Af Amer: >90 mL/min (ref >90)
Potassium: 4.2 mEq/L (ref 3.5–5.1)
Sodium: 138 mEq/L (ref 135–145)
Total Bilirubin: 0.5 mg/dL (ref 0.3–1.2)

## 2013-09-03 LAB — CBC
HCT: 49.9 % (ref 39.0–52.0)
MCV: 91.9 fL (ref 78.0–100.0)
Platelets: 170 10*3/uL (ref 150–400)
RBC: 5.43 MIL/uL (ref 4.22–5.81)
WBC: 9.2 10*3/uL (ref 4.0–10.5)

## 2013-09-03 LAB — SURGICAL PCR SCREEN: MRSA, PCR: NEGATIVE

## 2013-09-03 SURGERY — OPEN REDUCTION INTERNAL FIXATION (ORIF) ANKLE FRACTURE
Anesthesia: Regional | Site: Ankle | Laterality: Right

## 2013-09-03 MED ORDER — CEFAZOLIN SODIUM-DEXTROSE 2-3 GM-% IV SOLR
2.0000 g | Freq: Four times a day (QID) | INTRAVENOUS | Status: AC
  Administered 2013-09-03 – 2013-09-04 (×3): 2 g via INTRAVENOUS
  Filled 2013-09-03 (×3): qty 50

## 2013-09-03 MED ORDER — ONDANSETRON HCL 4 MG PO TABS: 4.0000 mg | ORAL_TABLET | Freq: Four times a day (QID) | ORAL | Status: DC | PRN

## 2013-09-03 MED ORDER — ONDANSETRON HCL 4 MG/2ML IJ SOLN: 4.0000 mg | Freq: Four times a day (QID) | INTRAMUSCULAR | Status: DC | PRN

## 2013-09-03 MED ORDER — FENTANYL CITRATE 0.05 MG/ML IJ SOLN: 50.0000 ug | INTRAMUSCULAR | Status: DC | PRN

## 2013-09-03 MED ORDER — ASPIRIN EC 325 MG PO TBEC
325.0000 mg | DELAYED_RELEASE_TABLET | Freq: Every day | ORAL | Status: DC
  Administered 2013-09-03 – 2013-09-06 (×4): 325 mg via ORAL
  Filled 2013-09-03 (×4): qty 1

## 2013-09-03 MED ORDER — ONDANSETRON HCL 4 MG/2ML IJ SOLN
4.0000 mg | Freq: Four times a day (QID) | INTRAMUSCULAR | Status: DC | PRN
  Administered 2013-09-05: 4 mg via INTRAVENOUS
  Filled 2013-09-03: qty 2

## 2013-09-03 MED ORDER — SODIUM CHLORIDE 0.9 % IV SOLN
INTRAVENOUS | Status: DC
  Administered 2013-09-03: 20 mL/h via INTRAVENOUS

## 2013-09-03 MED ORDER — MIDAZOLAM HCL 2 MG/2ML IJ SOLN: 1.0000 mg | INTRAMUSCULAR | Status: DC | PRN

## 2013-09-03 MED ORDER — ARTIFICIAL TEARS OP OINT
TOPICAL_OINTMENT | OPHTHALMIC | Status: DC | PRN
  Administered 2013-09-03: 1 via OPHTHALMIC

## 2013-09-03 MED ORDER — 0.9 % SODIUM CHLORIDE (POUR BTL) OPTIME
TOPICAL | Status: DC | PRN
  Administered 2013-09-03: 1000 mL

## 2013-09-03 MED ORDER — HYDROMORPHONE HCL PF 1 MG/ML IJ SOLN
0.5000 mg | INTRAMUSCULAR | Status: DC | PRN
  Administered 2013-09-03 – 2013-09-05 (×12): 1 mg via INTRAVENOUS
  Filled 2013-09-03 (×12): qty 1

## 2013-09-03 MED ORDER — FENTANYL CITRATE 0.05 MG/ML IJ SOLN
100.0000 ug | Freq: Once | INTRAMUSCULAR | Status: AC
  Administered 2013-09-03: 100 ug via INTRAVENOUS

## 2013-09-03 MED ORDER — METOCLOPRAMIDE HCL 10 MG PO TABS: 5.0000 mg | ORAL_TABLET | Freq: Three times a day (TID) | ORAL | Status: DC | PRN

## 2013-09-03 MED ORDER — METHOCARBAMOL 500 MG PO TABS
500.0000 mg | ORAL_TABLET | Freq: Four times a day (QID) | ORAL | Status: DC | PRN
  Administered 2013-09-04 – 2013-09-05 (×4): 500 mg via ORAL
  Filled 2013-09-03 (×6): qty 1

## 2013-09-03 MED ORDER — PHENYLEPHRINE HCL 10 MG/ML IJ SOLN
INTRAMUSCULAR | Status: DC | PRN
  Administered 2013-09-03: 120 ug via INTRAVENOUS
  Administered 2013-09-03: 80 ug via INTRAVENOUS
  Administered 2013-09-03: 120 ug via INTRAVENOUS
  Administered 2013-09-03: 80 ug via INTRAVENOUS
  Administered 2013-09-03 (×2): 120 ug via INTRAVENOUS

## 2013-09-03 MED ORDER — OXYCODONE-ACETAMINOPHEN 5-325 MG PO TABS
1.0000 | ORAL_TABLET | ORAL | Status: DC | PRN
  Administered 2013-09-03 – 2013-09-06 (×12): 2 via ORAL
  Filled 2013-09-03 (×13): qty 2

## 2013-09-03 MED ORDER — MIDAZOLAM HCL 5 MG/5ML IJ SOLN
INTRAMUSCULAR | Status: DC | PRN
  Administered 2013-09-03: 2 mg via INTRAVENOUS

## 2013-09-03 MED ORDER — METHOCARBAMOL 100 MG/ML IJ SOLN
500.0000 mg | Freq: Four times a day (QID) | INTRAVENOUS | Status: DC | PRN
  Filled 2013-09-03: qty 5

## 2013-09-03 MED ORDER — FENTANYL CITRATE 0.05 MG/ML IJ SOLN
INTRAMUSCULAR | Status: DC | PRN
  Administered 2013-09-03: 100 ug via INTRAVENOUS

## 2013-09-03 MED ORDER — FENTANYL CITRATE 0.05 MG/ML IJ SOLN
INTRAMUSCULAR | Status: AC
  Filled 2013-09-03: qty 2

## 2013-09-03 MED ORDER — OXYCODONE HCL 5 MG/5ML PO SOLN: 5.0000 mg | Freq: Once | ORAL | Status: DC | PRN

## 2013-09-03 MED ORDER — HYDROCODONE-ACETAMINOPHEN 5-325 MG PO TABS
1.0000 | ORAL_TABLET | ORAL | Status: DC | PRN
  Administered 2013-09-04: 1 via ORAL
  Administered 2013-09-06 (×2): 2 via ORAL
  Filled 2013-09-03 (×4): qty 2

## 2013-09-03 MED ORDER — LIDOCAINE HCL (CARDIAC) 20 MG/ML IV SOLN
INTRAVENOUS | Status: DC | PRN
  Administered 2013-09-03: 100 mg via INTRAVENOUS

## 2013-09-03 MED ORDER — OXYCODONE HCL 5 MG PO TABS: 5.0000 mg | ORAL_TABLET | Freq: Once | ORAL | Status: DC | PRN

## 2013-09-03 MED ORDER — MIDAZOLAM HCL 2 MG/2ML IJ SOLN
2.0000 mg | Freq: Once | INTRAMUSCULAR | Status: DC
  Filled 2013-09-03: qty 2

## 2013-09-03 MED ORDER — BUPIVACAINE-EPINEPHRINE PF 0.5-1:200000 % IJ SOLN
INTRAMUSCULAR | Status: DC | PRN
  Administered 2013-09-03: 30 mL

## 2013-09-03 MED ORDER — MUPIROCIN 2 % EX OINT
TOPICAL_OINTMENT | Freq: Two times a day (BID) | CUTANEOUS | Status: DC
  Administered 2013-09-03: 13:00:00 via NASAL
  Filled 2013-09-03 (×2): qty 22

## 2013-09-03 MED ORDER — PROPOFOL 10 MG/ML IV BOLUS
INTRAVENOUS | Status: DC | PRN
  Administered 2013-09-03: 200 mg via INTRAVENOUS

## 2013-09-03 MED ORDER — HYDROMORPHONE HCL PF 1 MG/ML IJ SOLN: 0.2500 mg | INTRAMUSCULAR | Status: DC | PRN

## 2013-09-03 MED ORDER — FENTANYL CITRATE 0.05 MG/ML IJ SOLN
50.0000 ug | Freq: Once | INTRAMUSCULAR | Status: DC
  Filled 2013-09-03: qty 1

## 2013-09-03 MED ORDER — METOCLOPRAMIDE HCL 5 MG/ML IJ SOLN: 5.0000 mg | Freq: Three times a day (TID) | INTRAMUSCULAR | Status: DC | PRN

## 2013-09-03 MED ORDER — LACTATED RINGERS IV SOLN
INTRAVENOUS | Status: DC
  Administered 2013-09-03: 13:00:00 via INTRAVENOUS

## 2013-09-03 MED ORDER — MIDAZOLAM HCL 2 MG/2ML IJ SOLN
INTRAMUSCULAR | Status: AC
  Administered 2013-09-03: 2 mg
  Filled 2013-09-03: qty 2

## 2013-09-03 SURGICAL SUPPLY — 56 items
BANDAGE ESMARK 6X9 LF (GAUZE/BANDAGES/DRESSINGS) IMPLANT
BANDAGE GAUZE ELAST BULKY 4 IN (GAUZE/BANDAGES/DRESSINGS) ×1 IMPLANT
BIT DRILL 2.5X2.75 QC CALB (BIT) ×1 IMPLANT
BNDG CMPR 9X6 STRL LF SNTH (GAUZE/BANDAGES/DRESSINGS)
BNDG COHESIVE 4X5 TAN STRL (GAUZE/BANDAGES/DRESSINGS) ×2 IMPLANT
BNDG ESMARK 6X9 LF (GAUZE/BANDAGES/DRESSINGS)
BNDG GAUZE STRTCH 6 (GAUZE/BANDAGES/DRESSINGS) ×2 IMPLANT
CLOTH BEACON ORANGE TIMEOUT ST (SAFETY) ×2 IMPLANT
COVER SURGICAL LIGHT HANDLE (MISCELLANEOUS) ×2 IMPLANT
CUFF TOURNIQUET SINGLE 34IN LL (TOURNIQUET CUFF) IMPLANT
CUFF TOURNIQUET SINGLE 44IN (TOURNIQUET CUFF) IMPLANT
DRAPE C-ARM MINI 42X72 WSTRAPS (DRAPES) IMPLANT
DRAPE INCISE IOBAN 66X45 STRL (DRAPES) ×2 IMPLANT
DRAPE PROXIMA HALF (DRAPES) ×2 IMPLANT
DRAPE U-SHAPE 47X51 STRL (DRAPES) ×2 IMPLANT
DRSG ADAPTIC 3X8 NADH LF (GAUZE/BANDAGES/DRESSINGS) ×2 IMPLANT
DRSG PAD ABDOMINAL 8X10 ST (GAUZE/BANDAGES/DRESSINGS) ×2 IMPLANT
DURAPREP 26ML APPLICATOR (WOUND CARE) ×2 IMPLANT
ELECT REM PT RETURN 9FT ADLT (ELECTROSURGICAL) ×2
ELECTRODE REM PT RTRN 9FT ADLT (ELECTROSURGICAL) ×1 IMPLANT
GLOVE BIOGEL PI IND STRL 6.5 (GLOVE) IMPLANT
GLOVE BIOGEL PI IND STRL 7.5 (GLOVE) IMPLANT
GLOVE BIOGEL PI IND STRL 9 (GLOVE) ×1 IMPLANT
GLOVE BIOGEL PI INDICATOR 6.5 (GLOVE) ×1
GLOVE BIOGEL PI INDICATOR 7.5 (GLOVE) ×1
GLOVE BIOGEL PI INDICATOR 9 (GLOVE) ×1
GLOVE ECLIPSE 7.0 STRL STRAW (GLOVE) ×1 IMPLANT
GLOVE SURG ORTHO 9.0 STRL STRW (GLOVE) ×2 IMPLANT
GLOVE SURG SS PI 6.5 STRL IVOR (GLOVE) ×2 IMPLANT
GOWN PREVENTION PLUS LG XLONG (DISPOSABLE) ×2 IMPLANT
GOWN PREVENTION PLUS XLARGE (GOWN DISPOSABLE) ×2 IMPLANT
GOWN SRG XL XLNG 56XLVL 4 (GOWN DISPOSABLE) ×2 IMPLANT
GOWN STRL NON-REIN XL XLG LVL4 (GOWN DISPOSABLE) ×2
KIT BASIN OR (CUSTOM PROCEDURE TRAY) ×2 IMPLANT
KIT ROOM TURNOVER OR (KITS) ×2 IMPLANT
MANIFOLD NEPTUNE II (INSTRUMENTS) ×2 IMPLANT
NS IRRIG 1000ML POUR BTL (IV SOLUTION) ×2 IMPLANT
PACK ORTHO EXTREMITY (CUSTOM PROCEDURE TRAY) ×2 IMPLANT
PAD ARMBOARD 7.5X6 YLW CONV (MISCELLANEOUS) ×4 IMPLANT
PADDING CAST COTTON 6X4 STRL (CAST SUPPLIES) ×2 IMPLANT
PLATE LOCK 7H 92 BILAT FIB (Plate) ×1 IMPLANT
SCREW CORTICAL 3.5MM  20MM (Screw) ×1 IMPLANT
SCREW CORTICAL 3.5MM 20MM (Screw) IMPLANT
SCREW LOCK CORT STAR 3.5X10 (Screw) ×1 IMPLANT
SCREW LOCK CORT STAR 3.5X12 (Screw) ×1 IMPLANT
SCREW LOW PROFILE 12MMX3.5MM (Screw) ×3 IMPLANT
SPONGE GAUZE 4X4 12PLY (GAUZE/BANDAGES/DRESSINGS) ×2 IMPLANT
SPONGE LAP 18X18 X RAY DECT (DISPOSABLE) ×2 IMPLANT
STAPLER VISISTAT 35W (STAPLE) ×1 IMPLANT
SUCTION FRAZIER TIP 10 FR DISP (SUCTIONS) ×2 IMPLANT
SUT ETHILON 2 0 PSLX (SUTURE) IMPLANT
SUT VIC AB 2-0 CTB1 (SUTURE) ×4 IMPLANT
TOWEL OR 17X24 6PK STRL BLUE (TOWEL DISPOSABLE) ×2 IMPLANT
TOWEL OR 17X26 10 PK STRL BLUE (TOWEL DISPOSABLE) ×2 IMPLANT
TUBE CONNECTING 12X1/4 (SUCTIONS) ×2 IMPLANT
WATER STERILE IRR 1000ML POUR (IV SOLUTION) ×2 IMPLANT

## 2013-09-03 NOTE — Progress Notes (Signed)
Orthopedic Tech Progress Note Patient Details:  Harold Bailey 13-May-1966 [id number] Post op shoe fit for size and left in room for patient use.  Ortho Devices Type of Ortho Device: Postop shoe/boot Ortho Device/Splint Location: Right LE Ortho Device/Splint Interventions: Application   Asia R Thompson 09/03/2013, 4:32 PM

## 2013-09-03 NOTE — H&P (Signed)
Harold Bailey is an 47 y.o. male.   Chief Complaint: Displaced right ankle Weber B. fibular fracture HPI: Patient is a 47 year old gentleman who had a twisting fall sustaining a displaced Weber B. fibular fracture closed right ankle  Past Medical History  Diagnosis Date  . Shortness of breath     Hx of smoking.  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  . Polycythemia   . 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 Hypertension   . GERD (gastroesophageal reflux disease)   . Anxiety   . Arthritis     Left ankle  . Hyperlipidemia   . Prediabetes   . Drug abuse     Past Surgical History  Procedure Laterality Date  . Ankle surgery  2003    Left  . Shoulder arthroscopy  2008    Left  . Brain surgery  2009    Biopsy  . Fracture surgery  2003    Left ankle  . Tympanostomy tube placement  1993  . Ankle arthroscopy  01/17/2012    Procedure: ANKLE ARTHROSCOPY;  Surgeon: Nadara Mustard, MD;  Location: [facility] OR;  Service: Orthopedics;  Laterality: Left;  . Inner ear surgery    . Ankle fusion Left 11/2012    Dr Lajoyce Corners  . Orif ankle fracture Left 12/08/2012    Procedure: OPEN REDUCTION INTERNAL FIXATION (ORIF) ANKLE FRACTURE;  Surgeon: Nadara Mustard, MD;  Location: [facility] OR;  Service: Orthopedics;  Laterality: Left;  Removal Deep Hardware, Take Down Non-Union, Revision Internal Fixation Left Ankle  . Hardware removal Left 12/08/2012    Procedure: HARDWARE REMOVAL;  Surgeon: Nadara Mustard, MD;  Location: [facility] OR;  Service: Orthopedics;  Laterality: Left;  Removal Deep Hardware, Take Down Non-Union, Revision Internal Fixation Left Ankle  . I&d extremity Left 01/01/2013    Procedure: IRRIGATION AND DEBRIDEMENT EXTREMITY;  Surgeon: Nadara Mustard, MD;  Location: [facility] OR;  Service: Orthopedics;  Laterality: Left;  Irrigation, Debridement and Placement Antibiotic Beads Left Ankle   . Open reduction internal fixation (orif) scaphoid with distal radius graft Right 02/01/2013    Procedure: OPEN REDUCTION INTERNAL FIXATION (ORIF) RIGHT SCAPHOID FRACTURE WITH DISTAL RADIUS GRAFT;  Surgeon: Marlowe Shores, MD;  Location: [facility] OR;  Service: Orthopedics;  Laterality: Right;  . Scaphoid fracture surgery  02/04/2013    Family History  Problem Relation Age of Onset  . Hypertension Mother    Social History:  reports that he has been smoking Cigarettes.  He has a 45 pack-year smoking history. He has never used smokeless tobacco. He reports that he does not drink alcohol or use illicit drugs.  Allergies: No Known Allergies  No prescriptions prior to admission    No results found for this or any previous visit (from the past 48 hour(s)). No results found.  Review of Systems  All other systems reviewed and are negative.    Height 6' (1.829 m), weight 97.07 kg (214 lb). Physical Exam   On examination patient has a palpable dorsalis pedis pulse he has ecchymosis and bruising over the lateral malleolus he has fracture blisters medially. Patient copies blisters he was treated with topical Bactroban cream to have these resolved prior to surgery. Radiographs shows a displaced Weber B. fibular fracture with widening of the mortise. Assessment/Plan Assessment: Right ankle Weber  B. fibular fracture with displacement.  Plan: Will plan for open reduction internal fixation. Risks and benefits were discussed including infection neurovascular injury pain arthritis DVT need for additional surgery. Patient states he understands was to proceed at this time we'll plan for overnight observation. Patient states that due to his multiple medical problems he feels like he will require discharge to skilled nursing. We will have this evaluated.  DUDA,MARCUS V 09/03/2013, 6:47 AM

## 2013-09-03 NOTE — Progress Notes (Signed)
Anticipate patient will need discharge to skilled nursing facility. Patient states that he is not safe at home and he has fallen several times at home trying to use his walker. Patient has caused additional damage to his ankle from falling after his initial fracture and feel that patient would benefit from the discharge to skilled nursing. Patient expresses a desire to go to skilled nursing.

## 2013-09-03 NOTE — Anesthesia Preprocedure Evaluation (Addendum)
Anesthesia Evaluation  Patient identified by MRN, date of birth, ID band Patient awake    Reviewed: Allergy & Precautions, H&P , NPO status , Patient's Chart, lab work & pertinent test results  Airway Mallampati: II TM Distance: >3 FB Neck ROM: full    Dental  (+) Edentulous Upper, Edentulous Lower and Dental Advisory Given   Pulmonary shortness of breath, Current Smoker,          Cardiovascular hypertension, Rhythm:Regular Rate:Tachycardia     Neuro/Psych  Headaches, Anxiety    GI/Hepatic GERD-  ,  Endo/Other    Renal/GU      Musculoskeletal   Abdominal   Peds  Hematology   Anesthesia Other Findings   Reproductive/Obstetrics                          Anesthesia Physical Anesthesia Plan  ASA: II  Anesthesia Plan: General and Regional   Post-op Pain Management:    Induction: Intravenous  Airway Management Planned: LMA  Additional Equipment:   Intra-op Plan:   Post-operative Plan:   Informed Consent: I have reviewed the patients History and Physical, chart, labs and discussed the procedure including the risks, benefits and alternatives for the proposed anesthesia with the patient or authorized representative who has indicated his/her understanding and acceptance.     Plan Discussed with: CRNA, Anesthesiologist and Surgeon  Anesthesia Plan Comments:         Anesthesia Quick Evaluation

## 2013-09-03 NOTE — Op Note (Signed)
OPERATIVE REPORT  DATE OF SURGERY: 09/03/2013  PATIENT:  Leone Payor,  47 y.o. male  PRE-OPERATIVE DIAGNOSIS:  Right Weber B Fibula Fracture  POST-OPERATIVE DIAGNOSIS:  Right Weber B Fibula Fracture  PROCEDURE:  Procedure(s): OPEN REDUCTION INTERNAL FIXATION (ORIF) ANKLE FRACTURE  Biomet 7 hole plate  SURGEON:  Surgeon(s): Nadara Mustard, MD  ANESTHESIA:   general  EBL:  Minimal ML  SPECIMEN:  No Specimen  TOURNIQUET:  * No tourniquets in log *  PROCEDURE DETAILS: Patient is a 47 year old gentleman who fell sustaining a Weber B. fibular fracture the right. Patient was seen in the office he had developed fracture blisters which he had rubbed open and was started on antibiotic ointment and presents at this time for open reduction internal fixation. Risks and benefits were discussed including increased risk of infection neurovascular injury need for additional surgery arthritis. Patient states he understands and wished to proceed at this time. Description of procedure patient was brought to the operating room and underwent a general anesthetic. After adequate levels of anesthesia were obtained patient's right lower extremity was prepped using DuraPrep draped into a sterile field. A lateral incision was made over the fibula. This is carried down to bone subperiosteal dissection was used to cleanse the fracture site. There was a significant amount comminution and impaction on the fracture site and anticipate this is due to the patient's multiple falls on this fracture while at home after the injury. The fracture was cleansed reduced stabilized with a lateral plate with 3 locking screws distally and 3 compression screws proximally. The wound was irrigated with normal saline. C-arm fluoroscopy verified the reduction. The mortise was congruent. The wounds were again irrigated subcutaneous is closed using 2-0 Vicryl the skin was closed using staples. The wound is covered Adaptic orthopedic  sponges AB dressing Kerlix and Coban. Patient was extubated taken to the PACU in stable condition.  PLAN OF CARE: Admit to inpatient   PATIENT DISPOSITION:  PACU - hemodynamically stable.   Nadara Mustard, MD 09/03/2013 2:55 PM

## 2013-09-03 NOTE — Preoperative (Signed)
Beta Blockers   Reason not to administer Beta Blockers:Not Applicable 

## 2013-09-03 NOTE — Transfer of Care (Signed)
Immediate Anesthesia Transfer of Care Note  Patient: Harold Bailey  Procedure(s) Performed: Procedure(s) with comments: OPEN REDUCTION INTERNAL FIXATION (ORIF) ANKLE FRACTURE (Right) - Open Reduction Internal Fixation Right Fibula  Patient Location: PACU  Anesthesia Type:General  Level of Consciousness: awake, alert  and oriented  Airway & Oxygen Therapy: Patient Spontanous Breathing and Patient connected to nasal cannula oxygen  Post-op Assessment: Report given to PACU RN  Post vital signs: Reviewed and stable  Complications: No apparent anesthesia complications

## 2013-09-03 NOTE — Progress Notes (Signed)
Dr Chaney Malling called about pt pain being a 10 on pain scale. New orders noted also pt for a block.

## 2013-09-03 NOTE — Anesthesia Procedure Notes (Addendum)
Anesthesia Regional Block:  Popliteal block  Pre-Anesthetic Checklist: ,, timeout performed, Correct Patient, Correct Site, Correct Laterality, Correct Procedure, Correct Position, site marked, Risks and benefits discussed,  Surgical consent,  Pre-op evaluation,  At surgeon's request and post-op pain management  Laterality: Right  Prep: chloraprep       Needles:  Injection technique: Single-shot  Needle Type: Echogenic Stimulator Needle          Additional Needles:  Procedures: ultrasound guided (picture in chart) and nerve stimulator Popliteal block  Nerve Stimulator or Paresthesia:  Response: plantar flexion of foot, 0.45 mA,   Additional Responses:   Narrative:  Start time: 09/03/2013 1:02 PM End time: 09/03/2013 1:12 PM Injection made incrementally with aspirations every 5 mL.  Performed by: Personally  Anesthesiologist: Dr Chaney Malling  Additional Notes: Functioning IV was confirmed and monitors were applied.  A 90mm 21ga Arrow echogenic stimulator needle was used. Sterile prep and drape,hand hygiene and sterile gloves were used.  Negative aspiration and negative test dose prior to incremental administration of local anesthetic. The patient tolerated the procedure well.  Ultrasound guidance: relevent anatomy identified, needle position confirmed, local anesthetic spread visualized around nerve(s), vascular puncture avoided.  Image printed for medical record.    Procedure Name: LMA Insertion Date/Time: 09/03/2013 2:05 PM Performed by: Jefm Miles E Pre-anesthesia Checklist: Patient identified, Emergency Drugs available, Suction available, Patient being monitored and Timeout performed Patient Re-evaluated:Patient Re-evaluated prior to inductionOxygen Delivery Method: Circle system utilized Preoxygenation: Pre-oxygenation with 100% oxygen Intubation Type: IV induction LMA: LMA inserted LMA Size: 5.0 Number of attempts: 1 Placement Confirmation: positive ETCO2 and  breath sounds checked- equal and bilateral Tube secured with: Tape Dental Injury: Teeth and Oropharynx as per pre-operative assessment

## 2013-09-04 MED ORDER — DOCUSATE SODIUM 100 MG PO CAPS
100.0000 mg | ORAL_CAPSULE | Freq: Two times a day (BID) | ORAL | Status: DC
  Administered 2013-09-04 – 2013-09-06 (×5): 100 mg via ORAL
  Filled 2013-09-04 (×6): qty 1

## 2013-09-04 MED ORDER — ALPRAZOLAM 0.5 MG PO TABS
1.0000 mg | ORAL_TABLET | Freq: Every evening | ORAL | Status: DC | PRN
  Administered 2013-09-04 – 2013-09-05 (×3): 1 mg via ORAL
  Filled 2013-09-04 (×3): qty 2

## 2013-09-04 NOTE — Progress Notes (Signed)
Subjective: 1 Day Post-Op Procedure(s) (LRB): OPEN REDUCTION INTERNAL FIXATION (ORIF) ANKLE FRACTURE (Right) Patient reports pain as moderate.  Poor sleep last night no other complaints.  Objective: Vital signs in last 24 hours: Temp:  [97.1 F (36.2 C)-98.3 F (36.8 C)] 97.1 F (36.2 C) (12/13 0500) Pulse Rate:  [94-128] 102 (12/13 0500) Resp:  [8-23] 18 (12/13 0500) BP: (108-132)/(69-87) 125/73 mmHg (12/13 0500) SpO2:  [92 %-98 %] 92 % (12/13 0500) Weight:  [103.874 kg (229 lb)] 103.874 kg (229 lb) (12/12 1219)  Intake/Output from previous day: 12/12 0701 - 12/13 0700 In: 985.7 [I.V.:985.7] Out: 1225 [Urine:1225] Intake/Output this shift:     Recent Labs  09/03/13 1228  HGB 16.9    Recent Labs  09/03/13 1228  WBC 9.2  RBC 5.43  HCT 49.9  PLT 170    Recent Labs  09/03/13 1228  NA 138  K 4.2  CL 106  CO2 19  BUN 19  CREATININE 0.87  GLUCOSE 106*  CALCIUM 9.3    Recent Labs  09/03/13 1228  INR 0.98    Neurovascular intact Incision: dressing C/D/I Compartment soft Able to wiggle toes  Assessment/Plan: 1 Day Post-Op Procedure(s) (LRB): OPEN REDUCTION INTERNAL FIXATION (ORIF) ANKLE FRACTURE (Right) Up with therapy Plan d/c to SNF early next week  Elevate leg and encourage patient to wiggle toes often Romell Wolden 09/04/2013, 10:27 AM

## 2013-09-04 NOTE — Progress Notes (Signed)
OT Cancellation Note  Patient Details Name: Harold Bailey MRN: [id number] DOB: 09-19-1966   Cancelled Treatment:    Reason Eval/Treat Not Completed: Patient declined politely ("I am sorry, but I am going to take me a little nap right now").  Evette Georges [phone] 09/04/2013, 2:13 PM

## 2013-09-04 NOTE — Evaluation (Signed)
Physical Therapy Evaluation Patient Details Name: Harold Bailey MRN: [id number] DOB: [birth year] Today's Date: 09/04/2013 Time: 4098-1191 PT Time Calculation (min): 14 min  PT Assessment / Plan / Recommendation History of Present Illness  Pt sustained R ankle fx several months ago.  He has fallen at home 2+ times since original ORIF refracturing the ankle.  He underwent a second ORIF RLE yesterday, 09-03-13.  Clinical Impression  Patient is s/p ORIF R ankle surgery resulting in functional limitations due to the deficits listed below (see PT Problem List).  Patient will benefit from skilled PT to increase their independence and safety with mobility to allow discharge to the venue listed below.       PT Assessment  Patient needs continued PT services    Follow Up Recommendations  SNF    Does the patient have the potential to tolerate intense rehabilitation      Barriers to Discharge        Equipment Recommendations  None recommended by PT    Recommendations for Other Services     Frequency Min 3X/week    Precautions / Restrictions Precautions Precautions: Fall Restrictions Weight Bearing Restrictions: Yes RLE Weight Bearing: Non weight bearing Other Position/Activity Restrictions: post op shoe   Pertinent Vitals/Pain 10/10      Mobility  Bed Mobility Bed Mobility: Supine to Sit;Sit to Supine Supine to Sit: 4: Min guard Transfers Transfers: Sit to Stand;Stand to [employer] Transfers Sit to Stand: 4: Min assist;From bed;With upper extremity assist Stand to Sit: 4: Min assist;To bed;With upper extremity assist Stand Pivot Transfers: 4: Min assist;With armrests Details for Transfer Assistance: verbal cues for hand placement, sequencing, safety    Exercises     PT Diagnosis: Difficulty walking;Acute pain  PT Problem List: Decreased activity tolerance;Decreased balance;Decreased mobility;Decreased safety awareness;Decreased knowledge of precautions;Pain PT  Treatment Interventions: DME instruction;Gait training;Functional mobility training;Therapeutic activities;Therapeutic exercise;Patient/family education;Balance training     PT Goals(Current goals can be found in the care plan section) Acute Rehab PT Goals Patient Stated Goal: to walk PT Goal Formulation: With patient Time For Goal Achievement: 09/11/13 Potential to Achieve Goals: Good  Visit Information  Last PT Received On: 09/04/13 Assistance Needed: +2 (for safety) History of Present Illness: Pt sustained R ankle fx several months ago.  He has fallen at home 2+ times since original ORIF refracturing the ankle.  He underwent a second ORIF RLE yesterday, 09-03-13.       Prior Functioning  Home Living Family/patient expects to be discharged to:: Private residence Living Arrangements: Spouse/significant other Available Help at Discharge: Skilled Nursing Facility Type of Home: Apartment Home Access: Stairs to enter [employer] of Steps: 3 Entrance Stairs-Rails: Right Home Layout: One level Home Equipment: Walker - 2 wheels;Cane - quad Prior Function Level of Independence: Independent Communication Communication: No difficulties    Cognition  Cognition Arousal/Alertness: Awake/alert Behavior During Therapy: Impulsive Overall Cognitive Status: History of cognitive impairments - at baseline Memory: Decreased short-term memory;Decreased recall of precautions    Extremity/Trunk Assessment     Balance    End of Session PT - End of Session Equipment Utilized During Treatment: Gait belt Activity Tolerance: Patient tolerated treatment well Patient left: in chair;with call bell/phone within reach Nurse Communication: Mobility status  GP     Ilda Foil 09/04/2013, 1:16 PM  Aida Raider, PT  Office # [phone] Pager [phone]

## 2013-09-04 NOTE — Progress Notes (Addendum)
Pt complained of insomnia. States he takes Xanax nightly for sleep. Spoke with MD on call for Dr. Lajoyce Corners. Verbal orders received for two Xanax 0.5 mg. Will administer and continue to monitor.

## 2013-09-05 ENCOUNTER — Encounter (HOSPITAL_COMMUNITY): Payer: Self-pay

## 2013-09-05 MED ORDER — DIPHENHYDRAMINE HCL 25 MG PO CAPS
25.0000 mg | ORAL_CAPSULE | Freq: Four times a day (QID) | ORAL | Status: DC | PRN
  Administered 2013-09-05: 25 mg via ORAL
  Filled 2013-09-05: qty 1

## 2013-09-05 NOTE — Progress Notes (Addendum)
Clinical Social Work Department CLINICAL SOCIAL WORK PLACEMENT NOTE 09/05/2013  Patient:  Harold Bailey, Harold Bailey  Account Number:  [id number] Admit date:  09/03/2013  Clinical Social Worker:  Jetta Lout, Theresia Majors  Date/time:  09/05/2013 02:18 PM  Clinical Social Work is seeking post-discharge placement for this patient at the following level of care:   SKILLED NURSING   (*CSW will update this form in Epic as items are completed)   09/05/2013  Patient/family provided with [facility] Department of Clinical Social Work's list of facilities offering this level of care within the geographic area requested by the patient (or if unable, by the patient's family).  09/05/2013  Patient/family informed of their freedom to choose among providers that offer the needed level of care, that participate in Medicare, Medicaid or managed care program needed by the patient, have an available bed and are willing to accept the patient.  09/05/2013  Patient/family informed of [facility]' ownership interest in [facility], as well as of the fact that they are under no obligation to receive care at this facility.  PASARR submitted to [employer] on 01/19/2012 PASARR number received from [employer] on 01/19/2012  FL2 transmitted to all facilities in geographic area requested by pt/family on  09/05/2013 FL2 transmitted to all facilities within larger geographic area on   Patient informed that his/her managed care company has contracts with or will negotiate with  certain facilities, including the following:     Patient/family informed of bed offers received:  09/06/2013 Patient chooses bed at [facility] Physician recommends and patient chooses bed at    Patient to be transferred to [facility] on  09/06/2013 Patient to be transferred to facility by [facility]  The following physician request were entered in Epic:   Additional Comments: Patient had an existing PASARR from 01/19/2012.

## 2013-09-05 NOTE — Progress Notes (Signed)
Clinical Social Work Department BRIEF PSYCHOSOCIAL ASSESSMENT 09/05/2013  Patient:  Harold Bailey, Harold Bailey     Account Number:  [id number]     Admit date:  09/03/2013  Clinical Social Worker:  Hendricks Milo  Date/Time:  09/05/2013 02:12 PM  Referred by:  Physician  Date Referred:  09/04/2013 Referred for  SNF Placement   Other Referral:   Interview type:  Patient Other interview type:    PSYCHOSOCIAL DATA Living Status:  WIFE Admitted from facility:   Level of care:   Primary support name:  Woods Gangemi [phone] Primary support relationship to patient:  SPOUSE Degree of support available:   Very supportive per patient.    CURRENT CONCERNS  Other Concerns:    SOCIAL WORK ASSESSMENT / PLAN Clinical Social Worker (CSW) met with patient to discuss skilled nursing placement. Patient reported that he has been to [facility] for short term rehab before and "would not mind going back". Patient reported that his wife would like him to go to the [facility] in [place]. Patient agreeable to SNF search in [place] and [place] [facility].   Assessment/plan status:  Psychosocial Support/Ongoing Assessment of Needs Other assessment/ plan:   Information/referral to community resources:   CSW gave patient SNF list.    PATIENT'S/FAMILY'S RESPONSE TO PLAN OF CARE: Patient was appreciative of CSW visit and thanked CSW.

## 2013-09-05 NOTE — Evaluation (Signed)
Occupational Therapy Evaluation Patient Details Name: Harold Bailey MRN: [id number] DOB: [birth year] Today's Date: 09/05/2013 Time: 4098-1191 OT Time Calculation (min): 16 min  OT Assessment / Plan / Recommendation History of present illness Pt sustained R ankle fx several months ago.  He has fallen at home 2+ times since original ORIF refracturing the ankle.  He underwent a second ORIF RLE yesterday, 09-03-13.   Clinical Impression   Pt admitted with above. Will benefit from continued acute OT services to address below problem list. Recommend SNF for d/c planning.     OT Assessment  Patient needs continued OT Services    Follow Up Recommendations  SNF    Barriers to Discharge      Equipment Recommendations   (TBD at next venue)    Recommendations for Other Services    Frequency  Min 2X/week    Precautions / Restrictions Restrictions Weight Bearing Restrictions: Yes RLE Weight Bearing: Non weight bearing   Pertinent Vitals/Pain See vitals    ADL  Grooming: Performed;Wash/dry hands;Wash/dry face;Supervision/safety Where Assessed - Grooming: Unsupported sitting Lower Body Dressing: Simulated;Moderate assistance Where Assessed - Lower Body Dressing: Supported sit to Pharmacist, hospital: Mining engineer Method: Sit to Barista:  (bed) Equipment Used: Gait belt;Rolling walker Transfers/Ambulation Related to ADLs: Min assist with sit<>stand from bed    OT Diagnosis: Generalized weakness;Acute pain  OT Problem List: Decreased strength;Impaired balance (sitting and/or standing);Decreased activity tolerance;Pain;Decreased knowledge of use of DME or AE;Decreased knowledge of precautions OT Treatment Interventions: Self-care/ADL training;DME and/or AE instruction;Therapeutic activities;Balance training;Patient/family education   OT Goals(Current goals can be found in the care plan section) Acute Rehab OT Goals Patient  Stated Goal: to walk OT Goal Formulation: With patient Time For Goal Achievement: 09/19/13 Potential to Achieve Goals: Good  Visit Information  Last OT Received On: 09/05/13 Assistance Needed: +2 History of Present Illness: Pt sustained R ankle fx several months ago.  He has fallen at home 2+ times since original ORIF refracturing the ankle.  He underwent a second ORIF RLE yesterday, 09-03-13.       Prior Functioning     Home Living Family/patient expects to be discharged to:: Skilled nursing facility Prior Function Level of Independence: Independent         Vision/Perception     Cognition  Cognition Arousal/Alertness: Awake/alert Behavior During Therapy: Impulsive Overall Cognitive Status: History of cognitive impairments - at baseline Memory: Decreased short-term memory;Decreased recall of precautions    Extremity/Trunk Assessment Upper Extremity Assessment Upper Extremity Assessment: Overall WFL for tasks assessed     Mobility Bed Mobility Bed Mobility: Supine to Sit;Sit to Supine Supine to Sit: 4: Min guard Sit to Supine: 4: Min guard Transfers Transfers: Sit to Stand;Stand to Sit Sit to Stand: 4: Min assist;From bed Stand to Sit: 4: Min assist;To bed Details for Transfer Assistance: VCs for safe hand placement     Exercise     Balance     End of Session OT - End of Session Equipment Utilized During Treatment: Gait belt;Rolling walker Activity Tolerance: Patient limited by pain Patient left: in bed;with call bell/phone within reach  GO    09/05/2013 Cipriano Mile OTR/L Pager [phone] Office [phone]  Cipriano Mile 09/05/2013, 3:31 PM

## 2013-09-05 NOTE — Progress Notes (Signed)
Subjective: 2 Days Post-Op Procedure(s) (LRB): OPEN REDUCTION INTERNAL FIXATION (ORIF) ANKLE FRACTURE (Right) Patient reports pain as mild.  Slept better last night.  Objective: Vital signs in last 24 hours: Temp:  [97.8 F (36.6 C)-98.1 F (36.7 C)] 97.8 F (36.6 C) (12/14 0500) Pulse Rate:  [89-104] 104 (12/14 0500) Resp:  [17-18] 17 (12/14 0500) BP: (113-146)/(68-91) 121/91 mmHg (12/14 0500) SpO2:  [94 %-100 %] 94 % (12/14 0500)  Intake/Output from previous day: 12/13 0701 - 12/14 0700 In: 1560 [P.O.:1260; I.V.:300] Out: 1550 [Urine:1550] Intake/Output this shift:     Recent Labs  09/03/13 1228  HGB 16.9    Recent Labs  09/03/13 1228  WBC 9.2  RBC 5.43  HCT 49.9  PLT 170    Recent Labs  09/03/13 1228  NA 138  K 4.2  CL 106  CO2 19  BUN 19  CREATININE 0.87  GLUCOSE 106*  CALCIUM 9.3    Recent Labs  09/03/13 1228  INR 0.98    Sensation intact distally Intact pulses distally Incision: dressing C/D/I  Assessment/Plan: 2 Days Post-Op Procedure(s) (LRB): OPEN REDUCTION INTERNAL FIXATION (ORIF) ANKLE FRACTURE (Right) Discharge to SNF next 1-2 days.  Harold Bailey Y 09/05/2013, 9:03 AM

## 2013-09-06 ENCOUNTER — Inpatient Hospital Stay
Admission: RE | Admit: 2013-09-06 | Discharge: 2013-10-01 | Disposition: A | Discharge status: Home / Self Care | Payer: Medicaid Other | Source: Ambulatory Visit | Attending: Internal Medicine | Admitting: Internal Medicine

## 2013-09-06 MED ORDER — OXYCODONE-ACETAMINOPHEN 5-325 MG PO TABS: 1.0000 | ORAL_TABLET | ORAL | Status: DC | PRN

## 2013-09-06 NOTE — Discharge Summary (Signed)
Physician Discharge Summary  Patient ID: Harold Bailey MRN: [id number] DOB/AGE: [birth year] 47 y.o.  Admit date: 09/03/2013 Discharge date: 09/06/2013  Admission Diagnoses: Lateral malleolar fracture right ankle  Discharge Diagnoses:  Active Problems:   Ankle fracture, lateral malleolus, closed   Discharged Condition: stable  Hospital Course: Patient's hospital course was essentially unremarkable. Patient underwent open reduction internal fixation of the fibular fracture. Postoperatively and preoperatively patient was unsteady with his gait he had continually fallen on the ankle even after he broke it causing additional trauma. Patient is not safe for discharge to home and will plan for discharge to skilled nursing.  Consults: None  Significant Diagnostic Studies: labs: Routine labs  Treatments: surgery: See operative note  Discharge Exam: Blood pressure 111/54, pulse 93, temperature 97.8 F (36.6 C), temperature source Oral, resp. rate 18, height 6' (1.829 m), weight 103.874 kg (229 lb), SpO2 95.00%. Incision/Wound: dressing clean dry and intact  Disposition: 01-Home or Self Care   Future Appointments Provider Department Dept Phone   09/21/2013 11:00 AM Hilarie Fredrickson, MD [facility] [phone]   08/15/2014 2:00 PM Quentin Mulling, PA-C [place] [facility] [phone]       Medication List    ASK your doctor about these medications       celecoxib 100 MG capsule  Commonly known as:  CELEBREX  Take 100 mg by mouth 2 (two) times daily.     cyclobenzaprine 10 MG tablet  Commonly known as:  FLEXERIL  Take 1 tablet (10 mg total) by mouth 3 (three) times daily as needed for muscle spasms (or pain).     ibuprofen 800 MG tablet  Commonly known as:  ADVIL,MOTRIN  Take 1 tablet (800 mg total) by mouth 3 (three) times daily.     multivitamin with minerals Tabs tablet  Take 1 tablet by mouth daily.     omega-3 acid  ethyl esters 1 G capsule  Commonly known as:  LOVAZA  Take 1 g by mouth daily.     oxyCODONE-acetaminophen 5-325 MG per tablet  Commonly known as:  PERCOCET/ROXICET  Take 1 tablet by mouth every 4 (four) hours as needed for severe pain.           Follow-up Information   Follow up with Curtiss Mahmood V, MD In 3 weeks.   Specialty:  Orthopedic Surgery   Contact information:   [address] [place] [address] [phone]       Signed: Nadara Mustard 09/06/2013, 6:46 AM

## 2013-09-06 NOTE — Care Management Note (Signed)
CARE MANAGEMENT NOTE 09/06/2013  Patient:  Harold Bailey, Harold Bailey   Account Number:  [id number]  Date Initiated:  09/06/2013  Documentation initiated by:  Vance Peper  Subjective/Objective Assessment:   47 yr old male admitted with right ankle fracture.     Action/Plan:   Patient will need shortterm [facility]. Social worker is aware.   Anticipated DC Date:  09/06/2013   Anticipated DC Plan:  SKILLED NURSING FACILITY  In-house referral  Clinical Social Worker      DC Planning Services  CM consult      [facility] Choice  NA   Choice offered to / List presented to:          [facility] arranged  NA      Status of service:  Completed, signed off Discharge Disposition:  SKILLED NURSING FACILITY

## 2013-09-06 NOTE — Progress Notes (Signed)
CSW (Clinical Social Worker) prepared pt dc packet and placed with shadow chart. CSW arranged non-emergent ambulance transport. Pt, pt family, pt nurse, and facility informed. CSW signing off.  Harold Bailey, LCSWA [phone]  

## 2013-09-06 NOTE — Progress Notes (Signed)
Patient d/c to SNF this afternoon, IV removed.  Report called and given.

## 2013-09-06 NOTE — Progress Notes (Addendum)
Pt has bed available at [facility] today. CSW will assist with transfer to facility once pt has completed paperwork with facility representative.  Massiah Longanecker Lajean Saver [phone]   ADDENDUM: Pt has received bed offer from [facility] and would like to accept this bed offer instead. CSW left message with facility to inform. Will plan on dc after lunch if okay with nursing.  Relena Ivancic, LCSWA [phone]

## 2013-09-06 NOTE — Anesthesia Postprocedure Evaluation (Signed)
  Anesthesia Post-op Note  Patient: Harold Bailey  Procedure(s) Performed: Procedure(s) with comments: OPEN REDUCTION INTERNAL FIXATION (ORIF) ANKLE FRACTURE (Right) - Open Reduction Internal Fixation Right Fibula  Patient Location: PACU and Nursing Unit  Anesthesia Type:General  Level of Consciousness: awake, alert , oriented and patient cooperative  Airway and Oxygen Therapy: Patient Spontanous Breathing  Post-op Pain: none  Post-op Assessment: Post-op Vital signs reviewed, Patient's Cardiovascular Status Stable, Respiratory Function Stable, Patent Airway, No signs of Nausea or vomiting, Adequate PO intake and Pain level controlled  Post-op Vital Signs: Reviewed and stable  Complications: No apparent anesthesia complications

## 2013-09-06 NOTE — Progress Notes (Signed)
Physical Therapy Treatment Patient Details Name: Harold Bailey MRN: [id number] DOB: May 22, 1966 Today's Date: 09/06/2013 Time: 4098-1191 PT Time Calculation (min): 8 min  PT Assessment / Plan / Recommendation  History of Present Illness Pt sustained R ankle fx several months ago.  He has fallen at home 2+ times since original ORIF refracturing the ankle.  He underwent a second ORIF RLE yesterday, 09-03-13.   PT Comments   Making progress overall with mobility since most recent surgery, but showing quick movements and impulsive behaviors which likely explain the two falls at home since the original fracture and surgery  Follow Up Recommendations  SNF     Does the patient have the potential to tolerate intense rehabilitation     Barriers to Discharge        Equipment Recommendations  None recommended by PT    Recommendations for Other Services    Frequency Min 3X/week   Progress towards PT Goals Progress towards PT goals: Progressing toward goals  Plan Current plan remains appropriate    Precautions / Restrictions Precautions Precautions: Fall Restrictions Weight Bearing Restrictions: Yes RLE Weight Bearing: Non weight bearing   Pertinent Vitals/Pain 8/10 pain elevated for edema and pain control     Mobility  Bed Mobility Bed Mobility: Supine to Sit;Sit to Supine Supine to Sit: 4: Min guard Sit to Supine: 4: Min guard Details for Bed Mobility Assistance: [place]ard for safety as pt is impulsive, moving quickly Transfers Transfers: Sit to Stand;Stand to Sit Sit to Stand: From bed;4: Min guard Stand to Sit: To bed;4: Min guard Details for Transfer Assistance: VCs for safe hand placement; sat without controlled descent back to bed -- it seems more from impulsivity than weakness or fatique Ambulation/Gait Ambulation/Gait Assistance: 4: Min guard Ambulation Distance (Feet): 18 Feet (to and from bathroom) Assistive device: Rolling walker Ambulation/Gait Assistance  Details: Moving quite fast, with a few small  losses of balancefrom which pt recovered without physical assist    Exercises     PT Diagnosis:    PT Problem List:   PT Treatment Interventions:     PT Goals (current goals can now be found in the care plan section) Acute Rehab PT Goals Patient Stated Goal: to walk PT Goal Formulation: With patient Time For Goal Achievement: 09/11/13 Potential to Achieve Goals: Good  Visit Information  Last PT Received On: 09/06/13 Assistance Needed: +1 History of Present Illness: Pt sustained R ankle fx several months ago.  He has fallen at home 2+ times since original ORIF refracturing the ankle.  He underwent a second ORIF RLE yesterday, 09-03-13.    Subjective Data  Subjective: was wanting to take a noa, but agreeable to trying to go to bathroom Patient Stated Goal: to walk   Cognition  Cognition Arousal/Alertness: Awake/alert Behavior During Therapy: Impulsive Overall Cognitive Status: History of cognitive impairments - at baseline Memory: Decreased short-term memory;Decreased recall of precautions    Balance     End of Session PT - End of Session Activity Tolerance: Patient tolerated treatment well Patient left: in bed;with call bell/phone within reach Nurse Communication: Mobility status   GP     Harold Bailey [facility] [place], [place] [phone]  09/06/2013, 10:46 AM

## 2013-09-06 NOTE — Progress Notes (Signed)
Utilization review completed.  

## 2013-09-07 ENCOUNTER — Encounter (HOSPITAL_COMMUNITY): Payer: Self-pay | Admitting: Orthopedic Surgery

## 2013-09-07 ENCOUNTER — Non-Acute Institutional Stay (SKILLED_NURSING_FACILITY): Payer: Medicare Other | Admitting: Internal Medicine

## 2013-09-07 DIAGNOSIS — R6889 Other general symptoms and signs: Secondary | ICD-10-CM

## 2013-09-07 DIAGNOSIS — S8263XD Displaced fracture of lateral malleolus of unspecified fibula, subsequent encounter for closed fracture with routine healing: Secondary | ICD-10-CM

## 2013-09-07 DIAGNOSIS — I10 Essential (primary) hypertension: Secondary | ICD-10-CM

## 2013-09-07 DIAGNOSIS — R7309 Other abnormal glucose: Secondary | ICD-10-CM

## 2013-09-07 DIAGNOSIS — D751 Secondary polycythemia: Secondary | ICD-10-CM

## 2013-09-07 DIAGNOSIS — D45 Polycythemia vera: Secondary | ICD-10-CM

## 2013-09-07 DIAGNOSIS — R899 Unspecified abnormal finding in specimens from other organs, systems and tissues: Secondary | ICD-10-CM

## 2013-09-07 DIAGNOSIS — D496 Neoplasm of unspecified behavior of brain: Secondary | ICD-10-CM

## 2013-09-07 DIAGNOSIS — F411 Generalized anxiety disorder: Secondary | ICD-10-CM

## 2013-09-07 DIAGNOSIS — E785 Hyperlipidemia, unspecified: Secondary | ICD-10-CM

## 2013-09-07 DIAGNOSIS — R7303 Prediabetes: Secondary | ICD-10-CM

## 2013-09-07 DIAGNOSIS — IMO0001 Reserved for inherently not codable concepts without codable children: Secondary | ICD-10-CM

## 2013-09-07 NOTE — Progress Notes (Signed)
Patient ID: Harold Bailey, male   DOB: 12/16/1965, 47 y.o.   MRN: [id number]  This is an acute visit.  Level of care skilled.  Facilities [facility].  Chief complaint-acute visit status post hospitalization for right ankle fracture repair.  History of present illness.  Patient is a pleasant 47 year old male who was recently hospitalized for a lateral malleolar fracture of his right ankle.  He underwent an open reduction internal fixation of the fibular fracture.  Postop and preop he was unsteady with his gait and apparently had continually falling on the ankle even after he broke it which caused additional trauma.  It was thought his  interest would best be served by going to skilled nursing for a short time.  .  In regards to other issues he does have a historyt of a benign brain tumor which has resulted in some left eye visual loss mainly peripheral vision.  Also appears he's had some previous surgeries involving his ankles.   earlier this year in the March-April area he had hardware removal on the left with a revision internal fixation of the left ankle.  He subsequently had an irrigation and debridement.  Also has a history of a right scaphoid fracture which required open reduction internal fixation this was back in May of 2014  .  He has had previous ankle surgery back in 2003 as well.  Previous medical history.  Recent right ankle fracture status post repair as noted above.  History of numerous ankle surgeries as noted above.  History of tobacco use.  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  Polycythemia.  Some history of mild memory deficit.  Hypertension.  GERD.  Anxiety.  Hyperlipidemia.  pre diabetes.  Question drug abuse   .  Past surgical history  Left ankle surgery 2003.  Left shoulder arthroscopically 2008.  Brain surgery-2009 as noted above.  Left ankle fusion March  2014.  Or IF ankle fracture on the left 12/08/2012.  Hardware removal on that same date.  Irrigation and drainage left lower extremity 01/01/2013.  Open reduction internal fixation right scaphoid with distal radius fracture.   May 2014.  Medications.  Celebrex 100 mg twice a day.  Flexeril 10 mg 3 times a day when necessary.  Motrin 800 mg 3 times a day.  Multivitamin daily.  Lovasa 1 g daily  Percocet 05/325 one tab by mouth every 4 hours when necessary pain.  Social history-patient has a 45-pack-year smoking history denies using smokeless tobacco alcohol or illicit drugs.  Family history significant for hypertension in his mother  Review of systems.  In general denies any fever or chills.  Skin does not complaining of rash or itching.  Head ears eyes nose mouth and throat-visual changes as noted above secondary to brain tumor removal that he does have limited peripheral vision left eye-does not complaining of any sore throat or nasal discharge.  Respiratory-no complaints shortness of breath or cough.  Cardiac no chest pain.  GU-does not complaining of dysuria.  GI-no complaints of nausea vomiting diarrhea constipation says he has a fairly good appetite.  Muscle skeletal-is complaining of ankle pain says the Percocet although it helps is not totally effective.  Neurologic-does not complaining of headache dizziness or numbness.  Psych-does have some listed history of memory deficit but appears to be appropriate he is complaining of anxiety and says he has been on Xanax long-term.  Physical exam.  Temperature is 97.7 pulse  94 respirations 20 blood pressure 115/72-148/86-in this range.  In general this is a pleasant middle-age male in no distress resting comfortably in bed.  Her skin is warm and dry.  Eyes he does have limited peripheral vision of his left eye extraocular movements intact pupils appear reactive to light sclera and conjunctiva are clear  visual acuity right eye appears grossly intact.  Oral pharynx is clear mucous membranes moist he does appear to have somewhat of a  mouth droop.  Chest he has minimal expiratory wheezing upper lung fields and no labored breathing.  Heart is regular rate and rhythm with distant heart sounds there is no lower extremity edema has reduced pedal pulse on the left-right could not be assessed secondary to wrapping up his lower leg ankle area.  His abdomen soft nontender positive bowel sounds.  Muscle skeletal grip strength appears strong and intact bilaterally   Is able to flex and extend his left leg with it appears fairly adequate strength this was limited exam since he is in bed.  Right leg there is wrapping over the lower leg ankle area capillary refill of the toes is intact touch sensation intact  He is able to flex and extend his toes.  Neurologic is grossly intact again I do not somewhat of a mouth droop his speech is clear no true lateralizing findings.  Psych he is appears grossly alert and oriented pleasant and appropriate.  Labs.  Sep 03 2013.  Sodium 138 potassium 4.2 BUN 19 creatinine 0.87.--Liver function tests within normal limits except AST of 77 and ALT of 92  WBC 9.2 hemoglobin 16.9 platelets 171  08/12/2013.  Hemoglobin A1c 5.5.  TSH-1.72.  Cholesterol 238 triglycerides 449 HDL 35 LDL could not be calculated secondary to triglyceride level  Assessment and plan.  #1-history of right ankle fracture with repair-will need orthopedic followup which has been scheduled-he says he's had increased pain will increase the Percocet to 10 mg from 5 mg every 4 hours when necessary and monitor he also continues on Motrin 800 mg 3 times a day-will discontinue the Celebrex since he is already on an NSAID-he says the Motrin appears to give him more relief.  He is also on Flexeril 3 times a day when necessary.  #2-hypertension-currently not on any medication it appears his blood  pressures are relatively well controlled although we have minimal readings continue to monitor.  #3-history of polycythemia -- it appears his labs in the hospital were fairly stable-we'll update this with a CBC and CMP tomorrow.  #4-history of mildly elevated liver function tests-we'll update liver function tests.  #5 anxiety-Will restart his Xanax low dose 0.25 mg 3 times a day when necessary and monitor we may have to titrate this up apparently he was on a significantly larger dose at home.  #6 history of prediabetes?-Will order CBGs every morning for now and monitor -hem-globin A1c was 5.5 last month.  #7-history of hyperlipidemia high triglycerides-he is on the lovaza-it appears he has a followup appointment in late December with Dr. Marina Goodell of [place].  Gastroenterology-he apparently is following this--per my review in EPIC  #8 past history of benign brain tumor-again this has been removed resulting in some apparently peripheral visual deficits on the left as well as mild memory deficit.  #9-history of tobacco use-apparently he smoked right up to his recent hospitalization-he may be a candidate for a patch   [id number]-of note greater than 45 minutes spent assessing patient-reviewing his medical records-and coordinating and  formulating a  plan of care for numerous diagnoses          .  Marland Kitchen

## 2013-09-08 ENCOUNTER — Non-Acute Institutional Stay (SKILLED_NURSING_FACILITY): Payer: Medicare Other | Admitting: Internal Medicine

## 2013-09-08 DIAGNOSIS — R269 Unspecified abnormalities of gait and mobility: Secondary | ICD-10-CM

## 2013-09-08 DIAGNOSIS — S8263XD Displaced fracture of lateral malleolus of unspecified fibula, subsequent encounter for closed fracture with routine healing: Secondary | ICD-10-CM

## 2013-09-08 DIAGNOSIS — IMO0001 Reserved for inherently not codable concepts without codable children: Secondary | ICD-10-CM

## 2013-09-08 NOTE — Progress Notes (Signed)
Patient ID: Harold Bailey, male   DOB: 07/17/1966, 47 y.o.   MRN: [id number]  Facility; [facility] Chief complaint; admission to SNF post admit to [facility] from December 12 to December 15  History; this is a 47 year old man who lives at home with his wife. He fell while apparently trying to feed his dogs. He was diagnosed with a lateral malleoli her ankle fracture on the right the. He apparently continued to fall even after the fracture therefore he was admitted for fixation. There is reference to postoperative and preoperative unsteadiness of his gait. He has had previous issues with fractures of the left ankle and also over the right hand. He has a history of a benign brain tumor which was nonoperative followed by [facility] or more recently by [facility] neurologic associates.  He is on disability secondary to short term memory loss apparently related to his neurologic disability.  Past Medical History  Diagnosis Date  . Shortness of breath     Hx of smoking.  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  . Polycythemia   . 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 Hypertension   . GERD (gastroesophageal reflux disease)   . Anxiety   . Arthritis     Left ankle  . Hyperlipidemia   . Prediabetes   . Drug abuse    Past Surgical History  Procedure Laterality Date  . Ankle surgery  2003    Left  . Shoulder arthroscopy  2008    Left  . Brain surgery  2009    Biopsy  . Fracture surgery  2003    Left ankle  . Tympanostomy tube placement  1993  . Ankle arthroscopy  01/17/2012    Procedure: ANKLE ARTHROSCOPY;  Surgeon: [name], MD;  Location: [place];  Service: Orthopedics;  Laterality: Left;  . Inner ear surgery    . Ankle fusion Left 11/2012    Dr Sharol Given  . Orif ankle fracture Left 12/08/2012    Procedure: OPEN REDUCTION INTERNAL FIXATION (ORIF) ANKLE  FRACTURE;  Surgeon: [name], MD;  Location: [place];  Service: Orthopedics;  Laterality: Left;  Removal Deep Hardware, Take Down Non-Union, Revision Internal Fixation Left Ankle  . Hardware removal Left 12/08/2012    Procedure: HARDWARE REMOVAL;  Surgeon: [name], MD;  Location: [place];  Service: Orthopedics;  Laterality: Left;  Removal Deep Hardware, Take Down Non-Union, Revision Internal Fixation Left Ankle  . I&d extremity Left 01/01/2013    Procedure: IRRIGATION AND DEBRIDEMENT EXTREMITY;  Surgeon: [name], MD;  Location: [place];  Service: Orthopedics;  Laterality: Left;  Irrigation, Debridement and Placement Antibiotic Beads Left Ankle  . Open reduction internal fixation (orif) scaphoid with distal radius graft Right 02/01/2013    Procedure: OPEN REDUCTION INTERNAL FIXATION (ORIF) RIGHT SCAPHOID FRACTURE WITH DISTAL RADIUS GRAFT;  Surgeon: Schuyler Amor, MD;  Location: [place];  Service: Orthopedics;  Laterality: Right;  . Scaphoid fracture surgery  02/04/2013  . Orif ankle fracture Right 09/03/2013    Procedure: OPEN REDUCTION INTERNAL FIXATION (ORIF) ANKLE FRACTURE;  Surgeon: [name], MD;  Location: [place];  Service: Orthopedics;  Laterality: Right;  Open Reduction Internal Fixation Right Fibula    Medication ;  Oxycodone when necessary, Celebrex 100 twice a day, Flexeril 10  3 times daily when necessary, multivitamin 1 tablet daily, Lopid is a 1 g daily,     MRI HEAD WITHOUT AND WITH CONTRAST    Technique:  Multiplanar, multiecho pulse sequences of the brain and surrounding structures were obtained according to standard protocol without and with intravenous contrast    Contrast: 20 ml Multihance    Comparison: CT 02/02/2008    Findings: There is a large enhancing mass centered at the splenium of the corpus callosum.  The mass has irregular enhancement of the wall with central nonenhancing necrosis.  There is a mild amount of hemorrhage associated with the mass.   The mass measures 29 x 46 mm in transverse dimension and 37 mm in cranial caudal dimension. There are small enhancing satellite nodules posterior to the mass on the right.  There is a moderate amount of vasogenic edema in the white matter right greater than left.  There is some obstruction of the temporal horn the right lateral ventricle due to the mass.  The tumor is bilateral but more extensive on the right.    The ventricles are not enlarged.  There is no acute infarct.  The tumor shows mild diffusion restriction.  There is mild midline shift of approximately 5 mm to the left.    There is acute and chronic sinusitis with air-fluid levels in the maxillary sinuses bilaterally.  There is also some mucosal thickening in the ethmoid sinuses and frontal sinus and an air- fluid level the sphenoid sinus.  Mucosal edema in the left mastoid sinus.    IMPRESSION: Large irregular enhancing mass centered in the splenium of the corpus callosum.  There is surrounding white matter edema with some obstruction of the right temporal horn.  This is most compatible with a glioblastoma multiforme.  While lymphoma can occur in this area I think this is not likely due to the heterogeneous necrotic appearance as well as the low density appearance on CT.    Acute and chronic sinusitis.  Social; the patient states he lives in [place] with his wife. It does not sound as though he is using ambulatory assist device. He has a history of substance abuse although was in the early 1990.  History   Social History  . Marital Status: Married    Spouse Name: N/A    Number of Children: N/A  . Years of Education: N/A   Occupational History  . Not on file.   Social History Main Topics  . Smoking status: Current Some Day Smoker -- 1.50 packs/day for 30 years    Types: Cigarettes  . Smokeless tobacco: Never Used  . Alcohol Use: No     Comment: quit 2013  . Drug Use: No  . Sexual Activity: Not on file    Other Topics Concern  . Not on file   Social History Narrative  . No narrative on file   Family History  Problem Relation Age of Onset  . Hypertension Mother     Review of systems Respiratory; smoker no cough no sputum Cardiac no chest pain GI no abdominal pain GU no incontinence no dysuria Musculoskeletal states he has very significant balance problems and apparently multiple falls  Physical examination Gen. elderly man in no distress. Respiratory clear entry bilaterally Cardiac heart sounds are normal no murmurs Abdomen no liver no spleen no tenderness Musculoskeletal; ankle is wrapped with a Cam Walker. Instructions noted not to look at the surgical site.  Neurologic; he has a left temporal visual loss.  Mild left pronator drift. Bilaterally brisk reflexes but symmetric.   Impression/plan #1 right lateral malleoli or fracture now status post operative repair he is nonweightbearing. #2 significant gait ataxia with apparent multiple falls. Presumably this is related to the brain tumor. The patient and his records state this is benign in spite of the MRI report above from 2009 #3 presumably multiple fractures  #4 short-term memory loss, [place] cause of this is unclear #5 apparently on both Celebrex and ibuprofen I seen no good reason for this

## 2013-09-09 ENCOUNTER — Non-Acute Institutional Stay (SKILLED_NURSING_FACILITY): Payer: Medicare Other | Admitting: Internal Medicine

## 2013-09-09 DIAGNOSIS — F411 Generalized anxiety disorder: Secondary | ICD-10-CM

## 2013-09-09 DIAGNOSIS — R609 Edema, unspecified: Secondary | ICD-10-CM | POA: Insufficient documentation

## 2013-09-09 DIAGNOSIS — R21 Rash and other nonspecific skin eruption: Secondary | ICD-10-CM

## 2013-09-09 NOTE — Progress Notes (Signed)
Patient ID: Harold Bailey, male   DOB: Feb 21, 1966, 47 y.o.   MRN: [id number]  This is an acute visit.  Level of care skilled.  Facility [facility].  Chief complaint-acute visit secondary to question swelling-rash-followup anxiety-.  History of present illness.  Patient is a pleasant 47 year old male here for rehabilitation after sustaining a right ankle fracture he is here for rehabilitation-apparently he had repeated falls prior to the fracture and actually after initially sustaining a fracture.  Apparently he told nursing this morning that at times he gets a rash this is more on his right arm and thorax also reported he thought his lip and tongue was swelling overnightat some point.  This does not appear to be the case this morning---.  His vital signs continued to be stable he is afebrile--do not see any new medications essentially since he came here-did increase his Percocet 2 days ago secondary to increased pain complaints but he had been on this previously.  He is also complaining of increased anxiety-he said he had been on Xanax 1 mg 3 times a day when necessary for considerable period of time-I did restart thisthere 0.25 mg 3 times a day when necessary 2 days ago he says this is not helping much.--  Family medical social history as been reviewed per progress note on December 16 as well as 09/08/2013.  Medications have been reviewed per MAR.  Review of systems.  In general he is denying any fever or chills.  Skin-as noted in history of present illness.  Head ears eyes nose mouth and throat-some what vague history of tongue swelling overnight --f this does not appear to be the case this morning  Cardiac-no complaint of chest pain.  Respiratory-does not complaining of any shortness of breath or cough.  Psych-does have a history of anxiety he says low dose Xanax is not helping much.  Physical exam.  Temperature is 90.5 pulse 96 respirations 20 blood pressure 120/72.  In  general this is a pleasant middle-age male in no distress lying in bed.  His skin is warm and dry I do not note any rash of the right arm or thorax area-he says this has resolved.  . Mouth-I do not see any edema of the lips or erythema.  Oropharynx-is clear mucous membranes moist tongue is midline with no sign of edema at this time-  Cardiac heart is regular rate and rhythm without murmur gallop or rub.  Chest is clear to auscultation with no labored breathing.  Abdomen soft nontender positive bowel sounds.    Labs.  09/08/2013.  WBC 7.6 hemoglobin 16.5 platelets 246.  Sodium 140 potassium 3.7 BUN 19 creatinine 0.81.  Liver function tests within normal limits.  Assessment and plan.  #1-question allergic reaction-I do not see any sign of this currently-somewhat of a vague history of intermittent rash and tongue swelling-at this point will continue to monitor have written an order for nursing to notify us if this reoccurs-clinically he appears quite stable-monitor with vital signs pulse ox every shift.  #2-anxiety-will increase the Xanax to 0.5 mg 3 times a day when necessary and monitor-anxiety might be contributing to the prior complaints as well-continue to monitor.  [id number]

## 2013-09-10 LAB — GLUCOSE, CAPILLARY: Glucose-Capillary: 100 mg/dL — ABNORMAL HIGH (ref 70–99)

## 2013-09-12 ENCOUNTER — Non-Acute Institutional Stay (SKILLED_NURSING_FACILITY): Payer: Medicare Other | Admitting: Internal Medicine

## 2013-09-12 DIAGNOSIS — I10 Essential (primary) hypertension: Secondary | ICD-10-CM

## 2013-09-12 DIAGNOSIS — F411 Generalized anxiety disorder: Secondary | ICD-10-CM

## 2013-09-12 DIAGNOSIS — F419 Anxiety disorder, unspecified: Secondary | ICD-10-CM

## 2013-09-12 NOTE — Progress Notes (Signed)
Patient ID: Harold Bailey, male   DOB: 06/08/1966, 47 y.o.   MRN: [id number]  This is an acute visit.  Level care skilled.  Facility [facility].  Chief complaint-to visit followup hypertension-anxiety.  History of present illness.  Patient is a pleasant middle-aged male here for rehabilitation after sustaining a right ankle fracture-during his stay here he has complained of anxiety we did restart him on Xanax 0.25 mg 3 times a day when necessary he said he had been on 1 mg 3 times a day previously at home.  We did titrate this up recently to 0.5 mg 3 times a day-however he is continuing to complain of anxiety his wife is in the room with him today and states also that he appears to be somewhat anxious in that he had been on a milligram 3 times a day previously and tolerated this quite well.  Of note he has complained of some itching and rash he did see his orthopedic doctor who did start Benadryl every 6 hours when necessary this appears to be helping.  I do note he has a listed history of hypertension I see a recent pressure 150/90 144/80 however baseline appears to be more in the 120s-I  see 111/73-129/94.-- he is not on any medication currently  I suspect possibly some A occasional systolics elevatioins are related to anxiety  Family medical social history as been reviewed per admission note on 09/08/2013.  Medications have been reviewed per MAR.  Review of systems.  In general denies any fever or chills.  Respiratory no complaints shortness breath or cough.  Cardiac no chest pain.  Neurologic no complaints of headache or dizziness.  Psych-does state he continues to have anxiety at times and the Xanax 0.5 is not totally effective.  Physical exam.  Temperature is 97.8 pulse 90-respirations 20-blood pressures as noted in history of present illness.  General this is a pleasant middle-age male in no distress resting comfortably in bed.  His skin is warm and dry he does have a  slight erythematous area at ante cubital area on his right arm-this may have come from itching does not appear to be cellulitic he says this is getting better   Chest-no labored breathing he does have a small amount of wheezing at the bases this is not really new.  Heart is regular rate and rhythm without murmur gallop or rub.  Psych-he is alert and oriented pleasant and appropriate his wife actually is in the room with him tonight states he does have some increased anxiety at times.  Labs.  09/08/2013.  WBC 7.6 hemoglobin 16.5 platelets 246.  Sodium 140 potassium 3.7 BUN 19 creatinine 0.81.  Liver function tests within normal limits.  Assessment plan.  #1-hypertension-at this point we'll continue to monitor this may be anxiety related I do not see consistent systolic elevations we'll readdress as needed.  #2-anxiety-secondary to apparent persistence-will increase Xanax to 1 mg 3 times a day-apparently this has been his baseline dose for an extended period of time-continue to monitor so far there've been no signs of somnolence  [id number]

## 2013-09-13 ENCOUNTER — Other Ambulatory Visit: Payer: Self-pay | Admitting: *Deleted

## 2013-09-13 MED ORDER — OXYCODONE-ACETAMINOPHEN 10-325 MG PO TABS: ORAL_TABLET | ORAL | Status: DC

## 2013-09-13 MED ORDER — ALPRAZOLAM 1 MG PO TABS: 1.0000 mg | ORAL_TABLET | Freq: Three times a day (TID) | ORAL | Status: DC | PRN

## 2013-09-13 MED ORDER — OXYCODONE-ACETAMINOPHEN 5-325 MG PO TABS: 1.0000 | ORAL_TABLET | ORAL | Status: DC | PRN

## 2013-09-13 NOTE — Telephone Encounter (Signed)
rx filled per protocol  

## 2013-09-15 LAB — GLUCOSE, CAPILLARY
Glucose-Capillary: 96 mg/dL (ref 70–99)
Glucose-Capillary: 105 mg/dL — ABNORMAL HIGH (ref 70–99)
Glucose-Capillary: 98 mg/dL (ref 70–99)
Glucose-Capillary: 106 mg/dL — ABNORMAL HIGH (ref 70–99)

## 2013-09-16 LAB — GLUCOSE, CAPILLARY: Glucose-Capillary: 154 mg/dL — ABNORMAL HIGH (ref 70–99)

## 2013-09-18 LAB — GLUCOSE, CAPILLARY: Glucose-Capillary: 190 mg/dL — ABNORMAL HIGH (ref 70–99)

## 2013-09-20 LAB — GLUCOSE, CAPILLARY: Glucose-Capillary: 112 mg/dL — ABNORMAL HIGH (ref 70–99)

## 2013-09-21 ENCOUNTER — Ambulatory Visit: Payer: Self-pay | Admitting: Internal Medicine

## 2013-09-26 LAB — GLUCOSE, CAPILLARY: GLUCOSE-CAPILLARY: 179 mg/dL — AB (ref 70–99)

## 2013-09-27 LAB — GLUCOSE, CAPILLARY: Glucose-Capillary: 106 mg/dL — ABNORMAL HIGH (ref 70–99)

## 2013-09-28 ENCOUNTER — Non-Acute Institutional Stay (SKILLED_NURSING_FACILITY): Payer: Medicare Other | Admitting: Internal Medicine

## 2013-09-28 DIAGNOSIS — R7309 Other abnormal glucose: Secondary | ICD-10-CM

## 2013-09-28 DIAGNOSIS — I10 Essential (primary) hypertension: Secondary | ICD-10-CM

## 2013-09-28 DIAGNOSIS — F419 Anxiety disorder, unspecified: Secondary | ICD-10-CM

## 2013-09-28 DIAGNOSIS — D45 Polycythemia vera: Secondary | ICD-10-CM

## 2013-09-28 DIAGNOSIS — E785 Hyperlipidemia, unspecified: Secondary | ICD-10-CM

## 2013-09-28 DIAGNOSIS — D751 Secondary polycythemia: Secondary | ICD-10-CM

## 2013-09-28 DIAGNOSIS — F411 Generalized anxiety disorder: Secondary | ICD-10-CM

## 2013-09-28 DIAGNOSIS — R7303 Prediabetes: Secondary | ICD-10-CM

## 2013-09-28 DIAGNOSIS — IMO0001 Reserved for inherently not codable concepts without codable children: Secondary | ICD-10-CM

## 2013-09-28 DIAGNOSIS — S8263XG Displaced fracture of lateral malleolus of unspecified fibula, subsequent encounter for closed fracture with delayed healing: Secondary | ICD-10-CM

## 2013-09-28 NOTE — Progress Notes (Signed)
Patient ID: KAESYN STIEG, male   DOB: 1966-02-04, 48 y.o.   MRN: [id number]    This is a discharge note. Apparently will care skilled.  Facility [facility].  Chief complaint-discharge note.  Marland Kitchen  History of present illness.  Patient is a pleasant 48 year old male who was recently hospitalized for a lateral malleolar fracture of his right ankle.  He underwent an open reduction internal fixation of the fibular fracture.  Postop and preop he was unsteady with his gait and apparently had continually falling on the ankle even after he broke it which caused additional trauma.  It was thought his interest would best be served by going to skilled nursing for a short time--he appears to have progressed well here is walking now with a Cam Walker--he will be going home with his spouse with weightbearing restrictions and will need outpatient therapy.  This pain appears to be well-controlled on Percocet when necessary.  He also has a history of anxiety is on Xanax 3 times a day as needed and this appears to help he says he is less anxious than when he first came in  Also receives Benadryl when necessary for itching that appears to help.  .  In regards to other issues he does have a historyt of a benign brain tumor which has resulted in some left eye visual loss mainly peripheral vision.  Also appears he's had some previous surgeries involving his ankles.  earlier this year in the March-April area he had hardware removal on the left with a revision internal fixation of the left ankle.  He subsequently had an irrigation and debridement.  Also has a history of a right scaphoid fracture which required open reduction internal fixation this was back in May of 2014  .  He has had previous ankle surgery back in 2003 as well  Today he is ambulating about facility [facility]-appears to be doing well looking forward to going home later this week  His vital signs appear to be stable he does not complaining of  any shortness of breath or chest pain appears to be in good spirits.   Previous medical history.  Recent right ankle fracture status post repair as noted above.  History of numerous ankle surgeries as noted above.  History of tobacco use.  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  Polycythemia.  Some history of mild memory deficit.  Hypertension.  GERD.  Anxiety.  Hyperlipidemia.  pre diabetes. --Blood sugars have been in the low 100s here Question drug abuse  .  Past surgical history  Left ankle surgery 2003.  Left shoulder arthroscopically 2008.  Brain surgery-2009 as noted above.  Left ankle fusion March 2014.  Or IF ankle fracture on the left 12/08/2012.  Hardware removal on that same date.  Irrigation and drainage left lower extremity 01/01/2013.  Open reduction internal fixation right scaphoid with distal radius fracture.  May 2014.   Medications Benadryl 25 mg when necessary every 6 hours.  One tab for moderate itching 2 tabs for severe itching.  Celebrex 200 mg twice a day.  Cyclobenzaprine 10 mg 3 times a day when necessary.  Multivitamin daily.  Omega-3 once a day.  Percocet -- 325 mg 1 tab every 4 hours when necessary.  Xanax 1 mg by mouth 3 times a day when necessary.    .   .  Social history-patient has a 45-pack-year smoking history denies using smokeless tobacco alcohol or  illicit drugs.   Family history significant for hypertension in his mother   Review of systems.  In general denies any fever or chills.  Skin -occasionally complains of itching and rash apparently Benadryl aiseffective with this.  Head ears eyes nose mouth and throat-visual changes as noted above secondary to brain tumor removal that he does have limited peripheral vision left eye-does not complaining of any sore throat or nasal discharge.  Respiratory-no complaints shortness of breath or cough.  Cardiac no chest  pain.  GU-does not complaining of dysuria.  GI-no complaints of nausea vomiting diarrhea constipation says he has a fairly good appetite.  Muscle skeletal-is complaining of ankle pain says the Percocet a is  effective.  Neurologic-does not complaining of headache dizziness or numbness.  Psych-does have some listed history of memory deficit but appears to be fiarly minimal .  Physical exam.  Temperature 97.6 pulse 90 respirations 18 blood pressure listed 134/89 I got 130/86 this evening-.  In general this is a pleasant middle-age male in no distress r.  r skin is warm and dry.--I do not see a rash this evening  Eyes he does have limited peripheral vision of his left eye extraocular movements intact pupils appear reactive to light sclera and conjunctiva are clear visual acuity right eye appears grossly intact.  Oral pharynx is clear mucous membranes moist he does appear to have somewhat of a mouth droop.  Chest --CTA and no labored breathing.  Heart is regular rate and rhythm w there is some mild lower extremity edema .  His abdomen soft nontender positive bowel sounds.  Muscle skeletal --he is ambulating with weightbearing restrictions has a walker on his right lower leg--r when removed there  was mild edema of his right lower leg pedal pulse was slightly reduced capillary refill was intact he was able to flex and extend his toes without pain .    Neurologic is grossly intact again I do not somewhat of a mouth droop his speech is clear no true lateralizing findings.  Psych he is appears grossly alert and oriented pleasant and appropriate.    Labs 09/08/2013.  WBC 7.6 hemoglobin 16.5 platelets 246.   sodium 140 potassium 3.7 BUN 19 creatinine 0. Liver function tests within normal limits  .  Sep 03 2013.  Sodium 138 potassium 4.2 BUN 19 creatinine 0.87.--Liver function tests within normal limits except AST of 77 and ALT of 92  WBC 9.2 hemoglobin 16.9 platelets 171  08/12/2013.    Hemoglobin A1c 5.5.  TSH-1.72.  Cholesterol 238 triglycerides 449 HDL 35 LDL could not be calculated secondary to triglyceride level   Assessment and plan.   #1-history of right ankle fracture with repair-he appears to be doing reasonably well with this will need outpatient therapy continues weightbearing restrictions will need orthopedic followup as well --- Percocet appears to help with pain .  #2-hypertension-currently not on any medication it appears his blood pressures are relatively well controlled although therer is some variability--recent systolics from the 093G-182X diastolics 93Z and 16R-CVEL update BMP r.  #3-history of polycythemia -- it appears his labs l were fairly stable-we'll update this with a CBC a.  #4-history of mildly elevated liver function tests-this has normalized per most recent labs.  #5 anxiety-Xanax was increased to what he says was his baseline at home this appears to be helping   #6 history of prediabetes?-Blood sugars have been in low 100s -hem-globin A1c was 5.5 recently.  #7-history of hyperlipidemia high triglycerides-apparently y this  is followed by GI --   #8 past history of benign brain tumor-again this has been removed resulting in some apparently peripheral visual deficits on the left as well as mild memory deficit.   9-rash-apparently this is intermittent and responds well to Benadryl I do not see a rash this evening   [id number]-of note greater than 30 minutes spent preparing this discharge summary   nt illness-

## 2013-10-05 ENCOUNTER — Ambulatory Visit (HOSPITAL_COMMUNITY): Payer: Medicare Other | Attending: Internal Medicine | Admitting: Physical Therapy

## 2013-10-07 ENCOUNTER — Ambulatory Visit: Payer: Self-pay | Admitting: Physician Assistant

## 2013-10-15 ENCOUNTER — Other Ambulatory Visit: Payer: Self-pay | Admitting: Physician Assistant

## 2013-10-15 ENCOUNTER — Ambulatory Visit (HOSPITAL_COMMUNITY)
Admission: RE | Admit: 2013-10-15 | Discharge: 2013-10-15 | Disposition: A | Discharge status: Home / Self Care | Payer: Medicare Other | Source: Ambulatory Visit | Attending: Internal Medicine | Admitting: Internal Medicine

## 2013-10-15 DIAGNOSIS — R296 Repeated falls: Secondary | ICD-10-CM | POA: Insufficient documentation

## 2013-10-15 DIAGNOSIS — E785 Hyperlipidemia, unspecified: Secondary | ICD-10-CM | POA: Insufficient documentation

## 2013-10-15 DIAGNOSIS — Z9181 History of falling: Secondary | ICD-10-CM | POA: Insufficient documentation

## 2013-10-15 DIAGNOSIS — M25572 Pain in left ankle and joints of left foot: Secondary | ICD-10-CM | POA: Insufficient documentation

## 2013-10-15 DIAGNOSIS — M25579 Pain in unspecified ankle and joints of unspecified foot: Secondary | ICD-10-CM

## 2013-10-15 DIAGNOSIS — R269 Unspecified abnormalities of gait and mobility: Secondary | ICD-10-CM | POA: Insufficient documentation

## 2013-10-15 DIAGNOSIS — M25473 Effusion, unspecified ankle: Secondary | ICD-10-CM | POA: Insufficient documentation

## 2013-10-15 DIAGNOSIS — M25476 Effusion, unspecified foot: Secondary | ICD-10-CM | POA: Insufficient documentation

## 2013-10-15 DIAGNOSIS — I10 Essential (primary) hypertension: Secondary | ICD-10-CM | POA: Insufficient documentation

## 2013-10-15 DIAGNOSIS — R7309 Other abnormal glucose: Secondary | ICD-10-CM | POA: Insufficient documentation

## 2013-10-15 DIAGNOSIS — IMO0001 Reserved for inherently not codable concepts without codable children: Secondary | ICD-10-CM | POA: Insufficient documentation

## 2013-10-15 MED ORDER — ALPRAZOLAM 1 MG PO TABS: 1.0000 mg | ORAL_TABLET | Freq: Three times a day (TID) | ORAL | Status: DC | PRN

## 2013-10-15 MED ORDER — CYCLOBENZAPRINE HCL 10 MG PO TABS: 10.0000 mg | ORAL_TABLET | Freq: Three times a day (TID) | ORAL | Status: DC | PRN

## 2013-10-15 NOTE — Evaluation (Signed)
Physical Therapy Evaluation  Patient Details  Name: Harold Bailey MRN: [id number] Date of Birth: November 12, 1965  Today's Date: 10/15/2013 Time: 6222-9798 PT Time Calculation (min): 50 minCharges PT evaluation   1445-1520  Manual 15 min   1520- 9211               Visit#: 1 of 18  Re-eval: 11/14/13 Assessment Diagnosis: right ankle fracture Surgical Date: 09/03/13 Next MD Visit: 10/29/2013 (Dr Sharol Given ) Prior Therapy: yes  inpateint rehab   Authorization: medicare    Authorization Time Period:    Authorization Visit#: 1 of 10   Past Medical History:  Past Medical History  Diagnosis Date  . Shortness of breath     Hx of smoking.  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  . Polycythemia   . 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 Hypertension   . GERD (gastroesophageal reflux disease)   . Anxiety   . Arthritis     Left ankle  . Hyperlipidemia   . Prediabetes   . Drug abuse    Past Surgical History:  Past Surgical History  Procedure Laterality Date  . Ankle surgery  2003    Left  . Shoulder arthroscopy  2008    Left  . Brain surgery  2009    Biopsy  . Fracture surgery  2003    Left ankle  . Tympanostomy tube placement  1993  . Ankle arthroscopy  01/17/2012    Procedure: ANKLE ARTHROSCOPY;  Surgeon: [name], MD;  Location: [place];  Service: Orthopedics;  Laterality: Left;  . Inner ear surgery    . Ankle fusion Left 11/2012    Dr Sharol Given  . Orif ankle fracture Left 12/08/2012    Procedure: OPEN REDUCTION INTERNAL FIXATION (ORIF) ANKLE FRACTURE;  Surgeon: [name], MD;  Location: [place];  Service: Orthopedics;  Laterality: Left;  Removal Deep Hardware, Take Down Non-Union, Revision Internal Fixation Left Ankle  . Hardware removal Left 12/08/2012    Procedure: HARDWARE REMOVAL;  Surgeon: [name], MD;  Location: [place];  Service:  Orthopedics;  Laterality: Left;  Removal Deep Hardware, Take Down Non-Union, Revision Internal Fixation Left Ankle  . I&d extremity Left 01/01/2013    Procedure: IRRIGATION AND DEBRIDEMENT EXTREMITY;  Surgeon: [name], MD;  Location: [place];  Service: Orthopedics;  Laterality: Left;  Irrigation, Debridement and Placement Antibiotic Beads Left Ankle  . Open reduction internal fixation (orif) scaphoid with distal radius graft Right 02/01/2013    Procedure: OPEN REDUCTION INTERNAL FIXATION (ORIF) RIGHT SCAPHOID FRACTURE WITH DISTAL RADIUS GRAFT;  Surgeon: Schuyler Amor, MD;  Location: [place];  Service: Orthopedics;  Laterality: Right;  . Scaphoid fracture surgery  02/04/2013  . Orif ankle fracture Right 09/03/2013    Procedure: OPEN REDUCTION INTERNAL FIXATION (ORIF) ANKLE FRACTURE;  Surgeon: [name], MD;  Location: [place];  Service: Orthopedics;  Laterality: Right;  Open Reduction Internal Fixation Right Fibula    Subjective Symptoms/Limitations Symptoms: right ankle swelling, pain, CAM boot , pateint reports full weight bearing , surgeon Dr. Sharol [facility] at [facility]  Pertinent History: left ankle fusion 2013, refused per re fracture, hx of MRSA, right ankle fracture due to dog chain injury/fall , internal hardware , ** hx of falls per equilibrium issues, brain tumor ,  memory loss over past 5 yrs realted to brain tumor, left ankle pain chronic  Limitations: Walking;House hold activities, sleep disturbance  How long can you sit comfortably?: pain with sitting  How long can you walk comfortably?: pain with walking  Patient Stated Goals: return to walking outside of boot , avoid falls  Pain Assessment Currently in Pain?: Yes Pain Score: 8 /10  Pain Location: Ankle Pain Orientation: Right Pain Type: Surgical pain Pain Onset: More than a month ago Pain Frequency: Constant Pain Relieving Factors: elevation R LE   Precautions/Restrictions - FALLS  Precautions Precautions: Other  (comment) (FALLS )  Balance Screening Balance Screen Has the patient fallen in the past 6 months: Yes How many times?: unabel to recall  Has the patient had a decrease in activity level because of a fear of falling? : Yes Is the patient reluctant to leave their home because of a fear of falling? : No  Prior Functioning  Prior Function Level of Independence: Independent with basic ADLs;Independent with gait  Able to Take Stairs?: Yes Driving: No Vocation: On disability Vocation Requirements: disability past 5 years per memory issues, brain tumor  Comments: lives with wife, walking no device , hx of falss   Cognition/Observation Cognition Overall Cognitive Status: Within Functional Limits for tasks assessed Observation/Other Assessments Observations: presents in CAM boot , R ankle lateral incision healed , mild warnth at ankle,  Other Assessments: circumference around malleoli R 31 cm, L 28   Sensation/Coordination/Flexibility/Functional Tests Sensation Light Touch: Appears Intact Functional Tests Functional Tests: FOTO 41 mobility   Assessment RLE AROM (degrees) Right Ankle Dorsiflexion: 10 Right Ankle Plantar Flexion: 15 Right Ankle Inversion: 14 Right Ankle Eversion: 10 RLE PROM (degrees) Right Ankle Dorsiflexion: 20 RLE Strength Right Ankle Dorsiflexion: 4/5 Right Ankle Plantar Flexion: 2+/5 LLE AROM (degrees) Left Ankle Dorsiflexion: 4 Left Ankle Plantar Flexion: 8 Left Ankle Inversion: 2 Left Ankle Eversion: 2  Exercise/Treatments Mobility/Balance  Ambulation/Gait Ambulation/Gait: Yes Ambulation/Gait Assistance: 7: Independent Gait Pattern: Wide base of support    Manual Therapy Manual Therapy: Myofascial release Myofascial Release: right ankle retro grade massage for swelling with right leg elevated   Physical Therapy Assessment and Plan PT Assessment and Plan Clinical Impression Statement: 48 yr old male referred to PT med dx of right ankle fracture  and surgery 09/03/13. He is currently in CAM boot, reports high pain levels, hx of falls and equilibrium disorder. His mobility limited per CAM boot,and B ankle pain. He requires skilled care to safely transition from CAM boot to no boot per surgeon and progress with gait training and balance program. Further balance assessments once patient is out of CAM boot. Also patient having left ankle pain per surgery one year ago/ fusion  Pt will benefit from skilled therapeutic intervention in order to improve on the following deficits: Abnormal gait;Decreased balance;Difficulty walking;Decreased mobility;Increased edema;Decreased range of motion;Decreased strength;Pain Rehab Potential: Good Clinical Impairments Affecting Rehab Potential: hx of left akle fusion, falls frequent, equilibrium problems, dx of brain tumor 5 years ago affecting memory , visual deficits per brain tumor  PT Frequency: Min 3X/week PT Duration: 6 weeks PT Treatment/Interventions: Gait training;Stair training;Functional mobility training;Therapeutic activities;Therapeutic exercise;Manual techniques;Patient/family education;Neuromuscular re-education;Modalities;Balance training PT Plan: retro massage, ankle ROM, ankle t band, gait training CAM boot + cane, standing balance as tolerated with hand support     Goals Home Exercise Program Pt/caregiver will Perform Home Exercise Program: With Supervision, verbal cues required/provided;For increased ROM PT Goal: Perform Home Exercise Program - Progress: Goal  set today PT Short Term Goals Time to Complete Short Term Goals: 3 weeks PT Short Term Goal 1: decrease right ankle swelling for pain control /ROM to less than 31 cm around malleoli  PT Short Term Goal 2: report pain less than 5/10 while sitting , and less than 6/10 while walking  PT Long Term Goals Time to Complete Long Term Goals: Other (comment) (6 weeks ) PT Long Term Goal 1: pain less than 4/10 with basic standing ADLS  PT Long  Term Goal 2: Patient safely walking level terrain  least restrictive device aroun floor obstacles without loss of balance for home safety  Long Term Goal 3: patient able to safely ascend and descend 2 -3 stairs with handrail for safety in community  Long Term Goal 4: deny falls and near falls at home  PT Long Term Goal 5: patient able to safely use straight cane  for safety as patient has hx of falls and equilibrium disorder   Problem List Patient Active Problem List   Diagnosis Date Noted  . Pain in joint, ankle and foot 10/15/2013  . Falls frequently 10/15/2013  . Swelling 09/09/2013  . Rash and nonspecific skin eruption 09/09/2013  . Ankle fracture, lateral malleolus, closed 09/03/2013  . Unspecified vitamin D deficiency 08/12/2013  . Polycythemia   . Memory deficit   . Drug abuse   . Hypertension   . GERD (gastroesophageal reflux disease)   . Anxiety   . Arthritis   . Hyperlipidemia   . Prediabetes     PT - End of Session Activity Tolerance: Patient tolerated treatment well General Behavior During Therapy: WFL for tasks assessed/performed PT Plan of Care Consulted and Agree with Plan of Care: Patient  GP Functional Assessment Tool Used: FOTO Mobility score 41  Functional Limitation: Mobility: Walking and moving around Mobility: Walking and Moving Around Current Status ([id number]): At least 40 percent but less than 60 percent impaired, limited or restricted Mobility: Walking and Moving Around Goal Status [phone]): At least 40 percent but less than 60 percent impaired, limited or restricted  Inez Stantz 10/15/2013, 4:47 PM  Physician Documentation Your signature is required to indicate approval of the treatment plan as stated above.  Please sign and either send electronically or make a copy of this report for your files and return this physician signed original.   Please mark one 1.__approve of plan  2. ___approve of plan with the following  conditions.   ______________________________                                                          _____________________ Physician Signature                                                                                                             Date

## 2013-10-21 ENCOUNTER — Ambulatory Visit (HOSPITAL_COMMUNITY): Payer: Self-pay | Admitting: Physical Therapy

## 2013-10-21 ENCOUNTER — Telehealth (HOSPITAL_COMMUNITY): Payer: Self-pay

## 2013-10-22 ENCOUNTER — Ambulatory Visit (HOSPITAL_COMMUNITY): Payer: Self-pay | Admitting: Physical Therapy

## 2013-10-29 ENCOUNTER — Inpatient Hospital Stay (HOSPITAL_COMMUNITY): Admission: RE | Admit: 2013-10-29 | Payer: Self-pay | Source: Ambulatory Visit

## 2013-10-29 ENCOUNTER — Telehealth (HOSPITAL_COMMUNITY): Payer: Self-pay

## 2013-11-01 ENCOUNTER — Other Ambulatory Visit: Payer: Self-pay | Admitting: Physician Assistant

## 2013-11-14 ENCOUNTER — Other Ambulatory Visit (HOSPITAL_BASED_OUTPATIENT_CLINIC_OR_DEPARTMENT_OTHER): Payer: Self-pay | Admitting: Internal Medicine

## 2013-11-16 ENCOUNTER — Other Ambulatory Visit: Payer: Self-pay | Admitting: Physician Assistant

## 2013-11-18 ENCOUNTER — Ambulatory Visit: Payer: Medicare Other | Admitting: Neurology

## 2013-11-19 ENCOUNTER — Encounter (INDEPENDENT_AMBULATORY_CARE_PROVIDER_SITE_OTHER): Payer: Self-pay

## 2013-11-19 ENCOUNTER — Ambulatory Visit (INDEPENDENT_AMBULATORY_CARE_PROVIDER_SITE_OTHER): Payer: Medicare Other | Admitting: Neurology

## 2013-11-19 ENCOUNTER — Encounter: Payer: Self-pay | Admitting: Neurology

## 2013-11-19 VITALS — BP 150/95 | HR 105 | Ht 72.0 in | Wt 228.0 lb

## 2013-11-19 DIAGNOSIS — R569 Unspecified convulsions: Secondary | ICD-10-CM

## 2013-11-19 DIAGNOSIS — E785 Hyperlipidemia, unspecified: Secondary | ICD-10-CM

## 2013-11-19 DIAGNOSIS — F1910 Other psychoactive substance abuse, uncomplicated: Secondary | ICD-10-CM

## 2013-11-19 DIAGNOSIS — R51 Headache: Secondary | ICD-10-CM

## 2013-11-19 DIAGNOSIS — R413 Other amnesia: Secondary | ICD-10-CM

## 2013-11-19 DIAGNOSIS — R269 Unspecified abnormalities of gait and mobility: Secondary | ICD-10-CM | POA: Insufficient documentation

## 2013-11-19 DIAGNOSIS — D496 Neoplasm of unspecified behavior of brain: Secondary | ICD-10-CM

## 2013-11-19 DIAGNOSIS — R519 Headache, unspecified: Secondary | ICD-10-CM | POA: Insufficient documentation

## 2013-11-19 DIAGNOSIS — I10 Essential (primary) hypertension: Secondary | ICD-10-CM

## 2013-11-19 HISTORY — DX: Unspecified convulsions: R56.9

## 2013-11-19 MED ORDER — LACOSAMIDE 100 MG PO TABS: 100.0000 mg | ORAL_TABLET | Freq: Two times a day (BID) | ORAL | Status: DC

## 2013-11-19 MED ORDER — CLONAZEPAM 1 MG PO TABS: 1.0000 mg | ORAL_TABLET | Freq: Two times a day (BID) | ORAL | Status: DC | PRN

## 2013-11-19 NOTE — Progress Notes (Signed)
PATIENT: Harold Bailey DOB: 01/14/1966  HISTORICAL  Harold Bailey is a 48 years old left-handed male, referred by his primary care physician PA Vicie Mutters, Dr. Sandi Mariscal, for evaluation of worsening gait difficulty, last visit 08/2012.  In May 2009 he presented with confusion, unsteady gait, had MRI brain w/wo in [facility] in May 2009, There is a large enhancing mass centered at the splenium of the corpus callosum. The mass has irregular enhancement of the wall with central nonenhancing necrosis. There is a mild amount of  hemorrhage associated with the mass. The mass measures 29 x 46 mm in transverse dimension and 37 mm in cranial caudal dimension.  There are small enhancing satellite nodules posterior to the mass on the right. There is a moderate amount of vasogenic edema in the white matter right greater than left. There is some obstruction of the temporal horn the right lateral ventricle due to the mass. The tumor is bilateral but more extensive on the right.  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 He has few follow up at [facility], He reports more frequent headache. He has been using Percocet for back pain and headache, also complains of intermittent blacking out spells.  EEG was normal.  Most recent MRI brian was in  July 2012 at [facility], Postsurgical changes of right posterior parietal approach biopsy of the corpus callosum with gliosis surrounding the biopsy tract. Nodular area of enhancement within the corpus callosum  not definitely present previously.There is increased FLAIR signal in this region compatible with edema. Scattered white  matter lesions are similar to previous study and borderline increased in number for patient's age. Grossly normal flow-related signal in the major intracranial arteries and dural sinuses. 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 Interval increase in nodular enhancement within the posterior splenium of the corpus callosum with increased surrounding FLAIR signal compatible with edema. Findings may represent disease progression or a new small adjacent lesion. The differential includes recurrent demyelinating or inflammatory disease such as multiple sclerosis. Tumor is not excluded. Scattered additional white matter lesions are unchanged from previous study and borderline increase in number for patient's age.  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  In last office visit in April 2014, He was given Topamax 100mg  bid, it was helping him, he had less blacking out episodes, but he quit taking it after a few months, in February 20 first he had a witnessed episode, He was so spaced out, urinate on himself, body shaking, he later also had multiple episodes of lookging to the left side, tremor, heart racing, could not response, lasting 10 minutes,  He continued to have ankle pain, gait difficulty,   REVIEW OF SYSTEMS: Full 14 system review of systems performed and  notable only for activity change, fatigue, hearing loss, ringing ears, loss of vision, eye pain, cramping Dorland's, heat  intolerance, flushing, abdominal pain, restless legs, insomnia, frequent urination, walking difficulty, coordination problems, memory loss, numbness, weakness  ALLERGIES: No Known Allergies  HOME MEDICATIONS: Current Outpatient Prescriptions on File Prior to Visit  Medication Sig Dispense Refill  . ALPRAZolam (XANAX) 1 MG tablet Take 1 tablet (1 mg total) by mouth 3 (three) times daily as needed for anxiety.  90 tablet  0  . celecoxib (CELEBREX) 100 MG capsule Take 100 mg by mouth 2 (two) times daily.      . cyclobenzaprine (FLEXERIL) 10 MG tablet Take 1 tablet (10 mg total) by mouth 3 (three) times daily as needed for muscle spasms (or pain).  60 tablet  0  . ibuprofen (ADVIL,MOTRIN) 800 MG tablet Take 1 tablet (800 mg total) by mouth 3 (three) times daily.  21 tablet  0  . Multiple Vitamin (MULTIVITAMIN WITH MINERALS) TABS Take 1 tablet by mouth daily.      Marland Kitchen omega-3 acid ethyl esters (LOVAZA) 1 G capsule Take 1 g by mouth daily.      Marland Kitchen oxyCODONE-acetaminophen (PERCOCET) 10-325 MG per tablet Take one tablet by mouth every four hours as needed for pain  180 tablet  0   No current facility-administered medications on file prior to visit.    PAST MEDICAL HISTORY: Past Medical History  Diagnosis Date  . Shortness of breath     Hx of smoking.  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  . Polycythemia   . 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 Hypertension   . GERD (gastroesophageal reflux disease)   . Anxiety   . Arthritis     Left ankle  . Hyperlipidemia   . Prediabetes   . Drug abuse   . Memory loss   . HA (headache)   . Gait disturbance     PAST SURGICAL HISTORY: Past Surgical History  Procedure Laterality Date  . Ankle surgery  2003    Left  . Shoulder arthroscopy  2008    Left  . Brain surgery  2009    Biopsy  . Fracture surgery  2003     Left ankle  . Tympanostomy tube placement  1993  . Ankle arthroscopy  01/17/2012    Procedure: ANKLE ARTHROSCOPY;  Surgeon: Nadara Mustard, MD;  Location: [facility] OR;  Service: Orthopedics;  Laterality: Left;  . Inner ear surgery    . Ankle fusion Left 11/2012    Dr Lajoyce Corners  . Orif ankle fracture Left 12/08/2012    Procedure: OPEN REDUCTION INTERNAL FIXATION (ORIF) ANKLE FRACTURE;  Surgeon: Nadara Mustard, MD;  Location: [facility] OR;  Service: Orthopedics;  Laterality: Left;  Removal Deep Hardware, Take Down Non-Union, Revision Internal Fixation Left Ankle  . Hardware removal Left 12/08/2012    Procedure: HARDWARE REMOVAL;  Surgeon: Nadara Mustard, MD;  Location: [facility] OR;  Service: Orthopedics;  Laterality: Left;  Removal Deep Hardware, Take Down Non-Union, Revision Internal Fixation Left Ankle  . I&d extremity Left 01/01/2013    Procedure: IRRIGATION AND DEBRIDEMENT EXTREMITY;  Surgeon: Nadara Mustard, MD;  Location: [facility] OR;  Service: Orthopedics;  Laterality: Left;  Irrigation, Debridement and Placement Antibiotic Beads Left Ankle  . Open reduction internal fixation (orif)  scaphoid with distal radius graft Right 02/01/2013    Procedure: OPEN REDUCTION INTERNAL FIXATION (ORIF) RIGHT SCAPHOID FRACTURE WITH DISTAL RADIUS GRAFT;  Surgeon: Schuyler Amor, MD;  Location: [place];  Service: Orthopedics;  Laterality: Right;  . Scaphoid fracture surgery  02/04/2013  . Orif ankle fracture Right 09/03/2013    Procedure: OPEN REDUCTION INTERNAL FIXATION (ORIF) ANKLE FRACTURE;  Surgeon: [name], MD;  Location: [place];  Service: Orthopedics;  Laterality: Right;  Open Reduction Internal Fixation Right Fibula    FAMILY HISTORY: Family History  Problem Relation Age of Onset  . Hypertension Mother     SOCIAL HISTORY:  History   Social History  . Marital Status: Married    Spouse Name: Vickie    Number of Children: 1  . Years of Education: 12   Occupational History  .      Disabled   Social History Main Topics   . Smoking status: Current Some Day Smoker -- 1.50 packs/day for 30 years    Types: Cigarettes  . Smokeless tobacco: Never Used  . Alcohol Use: No     Comment: quit 2013  . Drug Use: No  . Sexual Activity: Not on file   Other Topics Concern  . Not on file   Social History Narrative   Patient lives at home with his wife Maxwell Caul)    Disabled   Left handed   Education 12 th    Caffeine Coffee four cups     PHYSICAL EXAM   Filed Vitals:   11/19/13 1445  BP: 150/95  Pulse: 105  Height: 6' (1.829 m)  Weight: 228 lb (103.42 kg)    Not recorded    Body mass index is 30.92 kg/(m^2).   Generalized: In no acute distress  Neck: Supple, no carotid bruits   Cardiac: Regular rate rhythm  Pulmonary: Clear to auscultation bilaterally  Musculoskeletal: No deformity  Neurological examination  Mentation: Alert oriented to time, place, history taking, and causual conversation, Impulsive  Cranial nerve II-XII: Pupils were equal round reactive to light. Extraocular movements were full.  Visual field were full on confrontational test. Bilateral fundi were sharp.  Facial sensation and strength were normal. Hearing was intact to finger rubbing bilaterally. Uvula tongue midline.  Head turning and shoulder shrug and were normal and symmetric.Tongue protrusion into cheek strength was normal.  Motor: Normal tone, bulk and strength.  Sensory: Intact to fine touch, pinprick, preserved vibratory sensation, and proprioception at toes.  Coordination: Normal finger to nose, heel-to-shin bilaterally there was no truncal ataxia  Gait: Rising up from seated positionby pushing on a chair arm, atalgic, unsteady,  Romberg signs: Negative  Deep tendon reflexes: Brachioradialis 2/2, biceps 2/2, triceps 2/2, patellar 2/2, Achilles 2/2, plantar responses were flexor bilaterally.   DIAGNOSTIC DATA (LABS, IMAGING, TESTING) - I reviewed patient records, labs, notes, testing and imaging myself where  available.  Lab Results  Component Value Date   WBC 9.2 09/03/2013   HGB 16.9 09/03/2013   HCT 49.9 09/03/2013   MCV 91.9 09/03/2013   PLT 170 09/03/2013      Component Value Date/Time   NA 138 09/03/2013 1228   K 4.2 09/03/2013 1228   CL 106 09/03/2013 1228   CO2 19 09/03/2013 1228   GLUCOSE 106* 09/03/2013 1228   BUN 19 09/03/2013 1228   CREATININE 0.87 09/03/2013 1228   CREATININE 0.83 08/12/2013 1518   CALCIUM 9.3 09/03/2013 1228   PROT 7.4 09/03/2013 1228   ALBUMIN 3.7  09/03/2013 1228   AST 77* 09/03/2013 1228   ALT 92* 09/03/2013 1228   ALKPHOS 85 09/03/2013 1228   BILITOT 0.5 09/03/2013 1228   GFRNONAA >90 09/03/2013 1228   GFRAA >90 09/03/2013 1228   Lab Results  Component Value Date   CHOL 238* 08/12/2013   HDL 35* 08/12/2013   LDLCALC Comment:   Not calculated due to Triglyceride >400. Suggest ordering Direct LDL (Unit Code: 04599).   Total Cholesterol/HDL Ratio:CHD Risk                        Coronary Heart Disease Risk Table                                        Men       Women          1/2 Average Risk              3.4        3.3              Average Risk              5.0        4.4           2X Average Risk              9.6        7.1           3X Average Risk             23.4       11.0 Use the calculated Patient Ratio above and the CHD Risk table  to determine the patient's CHD Risk. ATP III Classification (LDL):       < 100        mg/dL         Optimal      774 - 129     mg/dL         Near or Above Optimal      130 - 159     mg/dL         Borderline High      160 - 189     mg/dL         High       > 142        mg/dL         Very High   39/53/2023   TRIG 449* 08/12/2013   CHOLHDL 6.8 08/12/2013   Lab Results  Component Value Date   HGBA1C 5.5 08/12/2013   No results found for this basename: [id number]   Lab Results  Component Value Date   TSH 1.720 08/12/2013      ASSESSMENT AND PLAN  BALRAJ IFFT is a 48 y.o. male with  lesion in the past,  biopsy proven meningeal encephalitis,  1 presenting with complex partial seizure, first out Vimpat 100 mg twice a day, EEG, 2 repeat MRI of the brain with without contrast, 3. clonazepam as needed for insomnia, he is also going to be evaluated by his primary care in March second, for his recent GI bleeding    60 minutes spent in coordinating his care, reviewing previous records, more than 50% time in face-to-face consultation     Levert Feinstein, M.D. Ph.D.  [facility] [address]  [place], [place] [address] [phone]

## 2013-11-22 ENCOUNTER — Encounter: Payer: Self-pay | Admitting: Emergency Medicine

## 2013-11-22 ENCOUNTER — Ambulatory Visit (INDEPENDENT_AMBULATORY_CARE_PROVIDER_SITE_OTHER): Payer: Medicare Other | Admitting: Emergency Medicine

## 2013-11-22 VITALS — BP 126/94 | HR 98 | Temp 98.4°F | Resp 18 | Ht 72.5 in | Wt 230.0 lb

## 2013-11-22 DIAGNOSIS — R5381 Other malaise: Secondary | ICD-10-CM

## 2013-11-22 DIAGNOSIS — R5383 Other fatigue: Secondary | ICD-10-CM

## 2013-11-22 DIAGNOSIS — R195 Other fecal abnormalities: Secondary | ICD-10-CM

## 2013-11-22 DIAGNOSIS — E782 Mixed hyperlipidemia: Secondary | ICD-10-CM

## 2013-11-22 DIAGNOSIS — R7309 Other abnormal glucose: Secondary | ICD-10-CM

## 2013-11-22 DIAGNOSIS — R21 Rash and other nonspecific skin eruption: Secondary | ICD-10-CM

## 2013-11-22 DIAGNOSIS — I10 Essential (primary) hypertension: Secondary | ICD-10-CM

## 2013-11-22 LAB — CBC WITH DIFFERENTIAL/PLATELET
Basophils Absolute: 0 10*3/uL (ref 0.0–0.1)
Basophils Relative: 0 % (ref 0–1)
EOS ABS: 0.3 10*3/uL (ref 0.0–0.7)
Eosinophils Relative: 3 % (ref 0–5)
HEMATOCRIT: 51.6 % (ref 39.0–52.0)
HEMOGLOBIN: 17.7 g/dL — AB (ref 13.0–17.0)
Lymphocytes Relative: 26 % (ref 12–46)
Lymphs Abs: 3 10*3/uL (ref 0.7–4.0)
MCH: 29.5 pg (ref 26.0–34.0)
MCHC: 34.3 g/dL (ref 30.0–36.0)
MCV: 85.9 fL (ref 78.0–100.0)
MONOS PCT: 5 % (ref 3–12)
Monocytes Absolute: 0.6 10*3/uL (ref 0.1–1.0)
NEUTROS PCT: 66 % (ref 43–77)
Neutro Abs: 7.6 10*3/uL (ref 1.7–7.7)
Platelets: 252 10*3/uL (ref 150–400)
RBC: 6.01 MIL/uL — AB (ref 4.22–5.81)
RDW: 15.5 % (ref 11.5–15.5)
WBC: 11.5 10*3/uL — ABNORMAL HIGH (ref 4.0–10.5)

## 2013-11-22 NOTE — Progress Notes (Signed)
Subjective:    Patient ID: Harold Bailey, male    DOB: 1965-10-19, 48 y.o.   MRN: [id number]  HPI Comments: 48 yo male presents for 3 month F/U for HTN, Cholesterol, Pre-Dm, D. Deficient. He has not been exercising routinely with back/ leg pain from multiple past injuries. He is trying to eat a little better.  LAST ABNORMAL LABS T 238 TG 449   He has been having blood in stools, bright red in the toilet. He notes BM have been regular. He denies any changes in energy or incontinence with stool. He denies any recent injury/ strain. He notes colonoscopy at age 23 but unsure of DX/ Reason or doctor who performed.  He was diagnosed with ? Seizure last Friday and was started on seizure medicine by Dr Krista Blue, unsure of medication name. He has future testing with EEG this week.   He notes rash started on back x 2 months mostly on back shoulders. He notes feels/ sees hives when rash occurs. He is unsure if there has been any change with soap at home. He denies stress when rash appears and notes usually occurs when lies down to go to sleep at night. He has not tried any OTC. He notes increased allergy drainage over last month or 2 also without sinus congestion.  He has been taking more of his XANAX than prescribed despite multiple discussions in the past about misuse and increased SE risk. He notes he has been more anxious since recent seizure. HE DENIES ADAMANTLY any past hx of Xanax abuse but knows he has a problem with RX pain medicine.   Rectal Bleeding  Associated symptoms include rash.      Medication List       This list is accurate as of: 11/22/13  4:59 PM.  Always use your most recent med list.               ADVAIR DISKUS 250-50 MCG/DOSE Aepb  Generic drug:  Fluticasone-Salmeterol     ALPRAZolam 1 MG tablet  Commonly known as:  XANAX  Take 1 tablet (1 mg total) by mouth 3 (three) times daily as needed for anxiety.     aspirin 81 MG tablet  Take 81 mg by mouth daily.     atenolol 100 MG tablet  Commonly known as:  TENORMIN  Take 100 mg by mouth daily.     celecoxib 100 MG capsule  Commonly known as:  CELEBREX  Take 100 mg by mouth 2 (two) times daily.     clonazePAM 1 MG tablet  Commonly known as:  KLONOPIN  Take 1 tablet (1 mg total) by mouth 2 (two) times daily as needed for anxiety.     cyclobenzaprine 10 MG tablet  Commonly known as:  FLEXERIL  Take 1 tablet (10 mg total) by mouth 3 (three) times daily as needed for muscle spasms (or pain).     FLOVENT HFA 110 MCG/ACT inhaler  Generic drug:  fluticasone     ibuprofen 800 MG tablet  Commonly known as:  ADVIL,MOTRIN  Take 1 tablet (800 mg total) by mouth 3 (three) times daily.     Lacosamide 100 MG Tabs  Commonly known as:  VIMPAT  Take 1 tablet (100 mg total) by mouth 2 (two) times daily.     Magnesium 250 MG Tabs  Take by mouth daily.     multivitamin with minerals Tabs tablet  Take 1 tablet by mouth daily.     mupirocin ointment  2 %  Commonly known as:  BACTROBAN     omega-3 acid ethyl esters 1 G capsule  Commonly known as:  LOVAZA  Take 1 g by mouth daily.     oxyCODONE-acetaminophen 10-325 MG per tablet  Commonly known as:  PERCOCET  Take one tablet by mouth every four hours as needed for pain     topiramate 100 MG tablet  Commonly known as:  TOPAMAX  Take 100 mg by mouth 2 (two) times daily.     VOLTAREN 1 % Gel  Generic drug:  diclofenac sodium       No Known Allergies Past Medical History  Diagnosis Date  . Shortness of breath     Hx of smoking.  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  . Polycythemia   . 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 Hypertension   . GERD (gastroesophageal reflux disease)   . Anxiety   . Arthritis     Left ankle  . Hyperlipidemia   . Prediabetes   . Drug abuse   . Memory loss   . HA (headache)    . Gait disturbance   . Seizures 11/19/2013     Review of Systems  HENT: Positive for postnasal drip and sneezing.   Gastrointestinal: Positive for blood in stool and hematochezia.  Musculoskeletal: Positive for arthralgias.  Skin: Positive for rash.  Psychiatric/Behavioral: The patient is nervous/anxious.   All other systems reviewed and are negative.   BP 126/94  Pulse 98  Temp(Src) 98.4 F (36.9 C) (Temporal)  Resp 18  Ht 6' 0.5" (1.842 m)  Wt 230 lb (104.327 kg)  BMI 30.75 kg/m2     Objective:   Physical Exam  Nursing note and vitals reviewed. Constitutional: He is oriented to person, place, and time. He appears well-developed and well-nourished.  HENT:  Head: Normocephalic and atraumatic.  Right Ear: External ear normal.  Left Ear: External ear normal.  Nose: Nose normal.  Mouth/Throat: No oropharyngeal exudate.  Cobblestones posterior pharynx Cloudy TM's bilaterally    Eyes: Conjunctivae and EOM are normal.  Neck: Normal range of motion. Neck supple. No JVD present. No thyromegaly present.  Cardiovascular: Normal rate, regular rhythm, normal heart sounds and intact distal pulses.   Pulmonary/Chest: Effort normal and breath sounds normal.  Congested Breath sounds, clears some with cough   Abdominal: Soft. Bowel sounds are normal. He exhibits no distension and no mass. There is no tenderness. There is no rebound and no guarding.  Genitourinary:  Prefers GI perform rectal exam  Musculoskeletal: Normal range of motion. He exhibits no edema and no tenderness.  Lymphadenopathy:    He has no cervical adenopathy.  Neurological: He is alert and oriented to person, place, and time. He has normal reflexes. No cranial nerve deficit. Coordination normal.  Skin: Skin is warm and dry. No rash noted. No erythema.  Psychiatric: He has a normal mood and affect. His behavior is normal.  Patient thinks he can take XANAX AS he feels necessary despite numerous discussions about no  early REFILLS, his HX of abuse of controlled substances and increased risk of SE. He is almost argumentative about my denying early refill.          Assessment & Plan:  1. Blood in stool ref GI- if any weakness ER 2.  Rash vs hives/ Allergic rhinitis- Zyrtec or benadryl OTC, increase H2o, allergy hygiene explained. 3. ? Seizure- keep f/u neuro 4. 3 month F/U for HTN, Cholesterol, Pre-Dm, D. Deficient. Needs healthy diet, cardio QD and obtain healthy weight. Check Labs, Check BP if >130/80 call office 5. Anxiety- Refill Xanax on 11/24/13, LONG discussion about compliance with medication. WILL NOT REFILL XANAX early. Spoke with wife about my multiple concerns. ADvised to start slowly weaning off cigarettes.   OVER 40 minutes of exam, counseling, chart review, referral performed

## 2013-11-22 NOTE — Patient Instructions (Signed)
Hives ADD Benadryl or Zyrtec over the counter  Hives are itchy, red, puffy (swollen) areas of the skin. Hives can change in size and location on your body. Hives can come and go for hours, days, or weeks. Hives do not spread from person to person (noncontagious). Scratching, exercise, and stress can make your hives worse. HOME CARE  Avoid things that cause your hives (triggers).  Take antihistamine medicines as told by your doctor. Do not drive while taking an antihistamine.  Take any other medicines for itching as told by your doctor.  Wear loose-fitting clothing.  Keep all doctor visits as told. GET HELP RIGHT AWAY IF:   You have a fever.  Your tongue or lips are puffy.  You have trouble breathing or swallowing.  You feel tightness in the throat or chest.  You have belly (abdominal) pain.  You have lasting or severe itching that is not helped by medicine.  You have painful or puffy joints. These problems may be the first sign of a life-threatening allergic reaction. Call your local emergency services (911 in [place] MAKE SURE YOU:   Understand these instructions.  Will watch your condition.  Will get help right away if you are not doing well or get worse. Document Released: 06/18/2008 Document Revised: 03/10/2012 Document Reviewed: 12/03/2011 [facility] Patient Information 2014 [place]. Bloody Stools Bloody stools means there is blood in your poop (stool). It is a sign that there is a problem somewhere in the digestive system. It is important for your doctor to find the cause of your bleeding, so the problem can be treated.  HOME CARE  Only take medicine as told by your doctor.  Eat foods with fiber (prunes, bran cereals).  Drink enough fluids to keep your pee (urine) clear or pale yellow.  Sit in warm water (sitz bath) for 10 to 15 minutes as told by your doctor.  Know how to take your medicines (enemas, suppositories) if advised by your doctor.  Watch for  signs that you are getting better or getting worse. GET HELP RIGHT AWAY IF:   You are not getting better.  You start to get better but then get worse again.  You have new problems.  You have severe bleeding from the place where poop comes out (rectum) that does not stop.  You throw up (vomit) blood.  You feel weak or pass out (faint).  You have a fever. MAKE SURE YOU:   Understand these instructions.  Will watch your condition.  Will get help right away if you are not doing well or get worse. Document Released: 08/28/2009 Document Revised: 12/02/2011 Document Reviewed: 01/25/2011 [facility] Patient Information 2014 [place].

## 2013-11-23 ENCOUNTER — Encounter: Payer: Self-pay | Admitting: Internal Medicine

## 2013-11-23 ENCOUNTER — Other Ambulatory Visit: Payer: Self-pay | Admitting: Emergency Medicine

## 2013-11-23 LAB — TSH: TSH: 0.845 u[IU]/mL (ref 0.350–4.500)

## 2013-11-23 LAB — BASIC METABOLIC PANEL WITH GFR
BUN: 15 mg/dL (ref 6–23)
CHLORIDE: 109 meq/L (ref 96–112)
CO2: 22 meq/L (ref 19–32)
Calcium: 9.4 mg/dL (ref 8.4–10.5)
Creat: 1.06 mg/dL (ref 0.50–1.35)
GFR, Est Non African American: 82 mL/min
GLUCOSE: 95 mg/dL (ref 70–99)
POTASSIUM: 4.4 meq/L (ref 3.5–5.3)
SODIUM: 142 meq/L (ref 135–145)

## 2013-11-23 LAB — HEMOGLOBIN A1C
HEMOGLOBIN A1C: 5.9 % — AB (ref <5.7)
Mean Plasma Glucose: 123 mg/dL — ABNORMAL HIGH (ref <117)

## 2013-11-23 LAB — HEPATIC FUNCTION PANEL
ALBUMIN: 4.7 g/dL (ref 3.5–5.2)
ALT: 16 U/L (ref 0–53)
AST: 16 U/L (ref 0–37)
Alkaline Phosphatase: 80 U/L (ref 39–117)
BILIRUBIN TOTAL: 0.3 mg/dL (ref 0.2–1.2)
Bilirubin, Direct: 0.1 mg/dL (ref 0.0–0.3)
Indirect Bilirubin: 0.2 mg/dL (ref 0.2–1.2)
TOTAL PROTEIN: 6.9 g/dL (ref 6.0–8.3)

## 2013-11-23 LAB — LIPID PANEL
CHOL/HDL RATIO: 5.5 ratio
Cholesterol: 159 mg/dL (ref 0–200)
HDL: 29 mg/dL — ABNORMAL LOW (ref >39)
LDL Cholesterol: 86 mg/dL (ref 0–99)
Triglycerides: 219 mg/dL — ABNORMAL HIGH (ref <150)
VLDL: 44 mg/dL — ABNORMAL HIGH (ref 0–40)

## 2013-11-23 MED ORDER — DOXYCYCLINE HYCLATE 100 MG PO TABS: 100.0000 mg | ORAL_TABLET | Freq: Two times a day (BID) | ORAL | Status: DC

## 2013-11-24 ENCOUNTER — Other Ambulatory Visit: Payer: Self-pay | Admitting: Emergency Medicine

## 2013-11-24 MED ORDER — ALPRAZOLAM 1 MG PO TABS: 1.0000 mg | ORAL_TABLET | Freq: Three times a day (TID) | ORAL | Status: DC | PRN

## 2013-12-01 ENCOUNTER — Other Ambulatory Visit: Payer: Self-pay | Admitting: Physician Assistant

## 2013-12-03 ENCOUNTER — Ambulatory Visit (INDEPENDENT_AMBULATORY_CARE_PROVIDER_SITE_OTHER): Payer: Medicare Other

## 2013-12-03 ENCOUNTER — Ambulatory Visit
Admission: RE | Admit: 2013-12-03 | Discharge: 2013-12-03 | Disposition: A | Discharge status: Home / Self Care | Payer: Medicare Other | Source: Ambulatory Visit | Attending: Neurology | Admitting: Neurology

## 2013-12-03 DIAGNOSIS — I10 Essential (primary) hypertension: Secondary | ICD-10-CM

## 2013-12-03 DIAGNOSIS — F1910 Other psychoactive substance abuse, uncomplicated: Secondary | ICD-10-CM

## 2013-12-03 DIAGNOSIS — E785 Hyperlipidemia, unspecified: Secondary | ICD-10-CM

## 2013-12-03 DIAGNOSIS — R413 Other amnesia: Secondary | ICD-10-CM

## 2013-12-03 DIAGNOSIS — D496 Neoplasm of unspecified behavior of brain: Secondary | ICD-10-CM

## 2013-12-03 DIAGNOSIS — R569 Unspecified convulsions: Secondary | ICD-10-CM

## 2013-12-03 MED ORDER — GADOBENATE DIMEGLUMINE 529 MG/ML IV SOLN
20.0000 mL | Freq: Once | INTRAVENOUS | Status: AC | PRN
  Administered 2013-12-03: 20 mL via INTRAVENOUS

## 2013-12-03 NOTE — Procedures (Signed)
   HISTORY: 48 years old male, with past medical history of brain lesions, centered at splenium of the corpus callosum, irregularly enhancing walls, with central nonenhancing necrosis since 2009, presenting with complex partial seizures  TECHNIQUE:  16 channel EEG was performed based on standard 10-16 international system. One channel was dedicated to EKG, which has demonstrates rapid sinus rhythm of 102 beats per minutes.  Upon awakening, the posterior background activity was well-developed, in alpha range 9 Hz, with amplitude of 25 microvoltage, reactive to eye opening and closure.  There was no evidence of epilepsy form discharge.  Photic stimulation was performed, which induced a symmetric photic driving.  Hyperventilation was performed, there was no abnormality elicit.  Stage II sleep was achieved, as evident by sleep spindle and K complex  CONCLUSION: This is a  normal awake and asleep EEG.  There is no electrodiagnostic evidence of epileptiform discharge

## 2013-12-07 ENCOUNTER — Telehealth: Payer: Self-pay | Admitting: Neurology

## 2013-12-07 NOTE — Telephone Encounter (Signed)
Harold Bailey: Please call patient, repeat MRI of the brain showed significant improvement compared to previous MRI scan in 2009, but continued to have evidence of damage at right posterior back part of his brain,  EEG was normal  I will explain to him in details when he comes back for followup in April

## 2013-12-07 NOTE — Telephone Encounter (Signed)
Pt's wife called and stated that they would like to get the results for the Pt's MRI and EEG.  You can call the cell # or the home # of [phone].  Thank you

## 2013-12-08 NOTE — Telephone Encounter (Signed)
Butch Penny, Dr. Krista Blue addressed this to you. Please advise.

## 2013-12-08 NOTE — Telephone Encounter (Signed)
Spoke to patient and relayed MRI brain results showing significant improvement compared to scan in 2009, per Dr. Krista Blue.  Also relayed normal EEG.  Explained the doctor would go into details at his follow up in April.

## 2013-12-10 ENCOUNTER — Telehealth: Payer: Self-pay | Admitting: Neurology

## 2013-12-10 NOTE — Telephone Encounter (Signed)
I have called him,   Abnormal MRI scan of the brain showing a of encephalomalacia and gliosis in the right medial parietal periventricular region without any enhancement. The mild changes of chronic microvascular ischemia and paranasal sinusitis. Overall significant improvement compared with previous MRI scan dated 02/02/2008  EEG.

## 2013-12-10 NOTE — Telephone Encounter (Signed)
Pt calling requesting MRI and EEG results. Please advise °

## 2013-12-10 NOTE — Telephone Encounter (Signed)
Patient calling to request EEG and MRI results from last week. Please call and advise patient.

## 2013-12-13 ENCOUNTER — Other Ambulatory Visit: Payer: Self-pay | Admitting: Emergency Medicine

## 2013-12-13 ENCOUNTER — Other Ambulatory Visit: Payer: Self-pay | Admitting: Physician Assistant

## 2013-12-21 ENCOUNTER — Other Ambulatory Visit: Payer: Self-pay | Admitting: Emergency Medicine

## 2013-12-27 ENCOUNTER — Other Ambulatory Visit: Payer: Self-pay | Admitting: Physician Assistant

## 2014-01-07 ENCOUNTER — Ambulatory Visit: Payer: Medicare Other | Admitting: Neurology

## 2014-01-17 ENCOUNTER — Ambulatory Visit: Payer: Self-pay | Admitting: Internal Medicine

## 2014-01-18 ENCOUNTER — Other Ambulatory Visit: Payer: Self-pay | Admitting: Emergency Medicine

## 2014-01-20 ENCOUNTER — Other Ambulatory Visit: Payer: Self-pay | Admitting: Internal Medicine

## 2014-02-04 ENCOUNTER — Ambulatory Visit: Payer: Self-pay | Admitting: Neurology

## 2014-03-08 ENCOUNTER — Telehealth: Payer: Self-pay | Admitting: *Deleted

## 2014-03-08 ENCOUNTER — Ambulatory Visit: Payer: Medicare Other | Admitting: Neurology

## 2014-03-08 NOTE — Telephone Encounter (Signed)
Pt calling about appt.  Cancelled again.  Relayed that pt informed that he has cancelled and no showed appts.  Needs to see other providers.

## 2014-03-09 ENCOUNTER — Telehealth: Payer: Self-pay | Admitting: Neurology

## 2014-03-09 NOTE — Telephone Encounter (Signed)
Pt called to let us know [facility] did not show up to bring pt to his apt. Pt states that PCP Dr. Dub Amis states he needs to come in to see Dr. Krista Blue before he will prescribe him his medication for seizures. Pt only has 3 weeks of seizure medicine left and I don't have an open spot for him. Per Angie I advised pt he has 2 no shows and if he gets 3 he will be dismissed from the practice. Please call pt back to set up an apt for a f/u. Thanks

## 2014-03-09 NOTE — Telephone Encounter (Signed)
Called pt to make an appt with Dr. Krista Blue on 03/17/14. Pt is aware of no shows and knows if pt miss this appt, that he will be dismissed from the practice, per policy. I advised the pt that if he has any other problems, questions or concerns to call the office. Pt verbalized understanding.

## 2014-03-17 ENCOUNTER — Ambulatory Visit (INDEPENDENT_AMBULATORY_CARE_PROVIDER_SITE_OTHER): Payer: Medicare Other | Admitting: Neurology

## 2014-03-17 ENCOUNTER — Encounter (INDEPENDENT_AMBULATORY_CARE_PROVIDER_SITE_OTHER): Payer: Self-pay

## 2014-03-17 ENCOUNTER — Other Ambulatory Visit: Payer: Self-pay | Admitting: Neurology

## 2014-03-17 ENCOUNTER — Encounter: Payer: Self-pay | Admitting: Neurology

## 2014-03-17 VITALS — BP 126/78 | HR 92 | Ht 73.0 in | Wt 232.0 lb

## 2014-03-17 DIAGNOSIS — R569 Unspecified convulsions: Secondary | ICD-10-CM

## 2014-03-17 DIAGNOSIS — R296 Repeated falls: Secondary | ICD-10-CM

## 2014-03-17 DIAGNOSIS — S8261XP Displaced fracture of lateral malleolus of right fibula, subsequent encounter for closed fracture with malunion: Secondary | ICD-10-CM

## 2014-03-17 DIAGNOSIS — R413 Other amnesia: Secondary | ICD-10-CM

## 2014-03-17 DIAGNOSIS — F1910 Other psychoactive substance abuse, uncomplicated: Secondary | ICD-10-CM

## 2014-03-17 MED ORDER — DIVALPROEX SODIUM ER 500 MG PO TB24: 500.0000 mg | ORAL_TABLET | Freq: Every day | ORAL | Status: DC

## 2014-03-17 MED ORDER — LACOSAMIDE 100 MG PO TABS: 100.0000 mg | ORAL_TABLET | Freq: Two times a day (BID) | ORAL | Status: DC

## 2014-03-17 MED ORDER — RIZATRIPTAN BENZOATE 5 MG PO TBDP: 5.0000 mg | ORAL_TABLET | ORAL | Status: DC | PRN

## 2014-03-17 MED ORDER — TOPIRAMATE 100 MG PO TABS: 100.0000 mg | ORAL_TABLET | Freq: Two times a day (BID) | ORAL | Status: DC

## 2014-03-17 NOTE — Progress Notes (Addendum)
PATIENT: Harold Bailey DOB: [birth year]  HISTORICAL ( initial visit Feb 2015)  Harold Bailey is a 48 years old left-handed male, referred by his primary care physician PA Vicie Mutters, Dr. Sandi Mariscal, for evaluation of worsening gait difficulty, last visit 08/2012.  In May 2009 he presented with confusion, unsteady gait, had MRI brain w/wo in [facility] in May 2009, There is a large enhancing mass centered at the splenium of the corpus callosum. The mass has irregular enhancement of the wall with central nonenhancing necrosis. There is a mild amount of  hemorrhage associated with the mass. The mass measures 29 x 46 mm in transverse dimension and 37 mm in cranial caudal dimension.  There are small enhancing satellite nodules posterior to the mass on the right. There is a moderate amount of vasogenic edema in the white matter right greater than left. There is some obstruction of the temporal horn the right lateral ventricle due to the mass. The tumor is bilateral but more extensive on the right.  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 He has few follow up at [facility], He reports more frequent headache. He has been using Percocet for back pain and headache, also complains of intermittent blacking out spells.  Most recent MRI brian was in  July 2012 at [facility], Postsurgical changes of right posterior parietal approach biopsy of the corpus callosum with gliosis surrounding the biopsy tract. Nodular area of enhancement within the corpus callosum  not definitely present previously.There is increased FLAIR signal in this region compatible with edema. Scattered  white matter lesions are similar to previous study and borderline increased in number for patient's age. Grossly normal flow-related signal in the major intracranial arteries and dural sinuses. 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 Interval increase in nodular enhancement within the posterior splenium of the corpus callosum with increased surrounding FLAIR signal compatible with edema. Findings may represent disease progression or a new small adjacent lesion. The differential includes recurrent demyelinating or inflammatory disease such as multiple sclerosis. Tumor is not excluded. Scattered additional white matter lesions are unchanged from previous study and borderline increase in number for patient's age.  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  In last office visit in April 2014, He was given Topamax 100mg  bid, it was helping him, he had less blacking out episodes, but he quit taking it after a few months, in November 13, 2013, he had a witnessed episode, He was so spaced out, urinate on himself, body shaking, he later also had multiple episodes of lookging to the left side, tremor, heart racing, could not response, lasting 10 minutes,  He continued to have ankle pain, gait difficulty,   UPDATE June 25th 2015: Repeat scan in March 2015 showed  encephalomalacia and gliosis in the right medial parietal periventricular region without any enhancement. The mild changes of chronic  microvascular ischemia and paranasal sinusitis. Overall significant improvement compared with previous MRI scan dated 02/02/2008  He continued to complains of daily moderate severe headaches, has taken multiple dose of ibuprofen every day, and also BC powders. He is also taking Topamax 100 mg twice a day, previously he was under [facility] neurosurgeon Dr. Marygrace Drought care, was given prescription of Percocet daily, he could not no longer continue his care at [facility] per patient, because the change of his insurance,  He also has episodes consistent with complex partial seizure, Vimpat 100 twice a day was started, no seizure like event noted.  He works around house, [employer] the yard, Education administrator the house.  REVIEW OF SYSTEMS: Full 14 system review of systems performed and notable only for activity change, fatigue, hearing loss, ringing ears, loss of vision, eye pain, cramping Dorland's, heat intolerance, flushing, abdominal pain, restless legs, insomnia, frequent urination, walking difficulty, coordination problems, memory loss, numbness, weakness  ALLERGIES: No Known Allergies  HOME MEDICATIONS: Current Outpatient Prescriptions on File Prior to Visit  Medication Sig Dispense Refill  . ADVAIR DISKUS 250-50 MCG/DOSE AEPB       . ALPRAZolam (XANAX) 1 MG tablet Take 1 tablet (1 mg total) by mouth 3 (three) times daily as needed for anxiety.  90 tablet  0  . aspirin 81 MG tablet Take 81 mg by mouth daily.      Marland Kitchen atenolol (TENORMIN) 100 MG tablet Take 100 mg by mouth daily.      . celecoxib (CELEBREX) 100 MG capsule Take 100 mg by mouth 2 (two) times daily.      . clonazePAM (KLONOPIN) 1 MG tablet Take 1 tablet (1 mg total) by mouth 2 (two) times daily as needed for anxiety.  6 tablet  0  . cyclobenzaprine (FLEXERIL) 10 MG tablet TAKE 1 TABLET BY MOUTH THREE TIMES DAILY AS NEEDED FOR MUSCLE SPASMS OR PAIN  60 tablet  0  . doxycycline (VIBRA-TABS) 100 MG tablet Take 1 tablet (100 mg total) by mouth 2 (two)  times daily.  20 tablet  0  . FLOVENT HFA 110 MCG/ACT inhaler       . ibuprofen (ADVIL,MOTRIN) 800 MG tablet Take 1 tablet (800 mg total) by mouth 3 (three) times daily.  21 tablet  0  . Lacosamide (VIMPAT) 100 MG TABS Take 1 tablet (100 mg total) by mouth 2 (two) times daily.  60 tablet  12  . Magnesium 250 MG TABS Take by mouth daily.      . Multiple Vitamin (MULTIVITAMIN WITH MINERALS) TABS Take 1 tablet by mouth daily.      . mupirocin ointment (BACTROBAN) 2 %       . omega-3 acid ethyl esters (LOVAZA) 1 G capsule Take 1 g by mouth daily.      Marland Kitchen oxyCODONE-acetaminophen (PERCOCET) 10-325 MG per tablet Take one tablet by mouth every four hours as needed for pain  180 tablet  0  . sertraline (ZOLOFT) 50 MG tablet TAKE 1 TABLET BY MOUTH DAILY  90 tablet  0  . topiramate (TOPAMAX) 100 MG tablet Take 100 mg by mouth 2 (two) times daily.      . VOLTAREN 1 % GEL        No current facility-administered medications on file prior to visit.    PAST MEDICAL HISTORY: Past Medical History  Diagnosis Date  . Shortness of breath  Hx of smoking.  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  . Polycythemia   . 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 Hypertension   . GERD (gastroesophageal reflux disease)   . Anxiety   . Arthritis     Left ankle  . Hyperlipidemia   . Prediabetes   . Drug abuse   . Memory loss   . HA (headache)   . Gait disturbance   . Seizures 11/19/2013    PAST SURGICAL HISTORY: Past Surgical History  Procedure Laterality Date  . Ankle surgery  2003    Left  . Shoulder arthroscopy  2008    Left  . Brain surgery  2009    Biopsy  . Fracture surgery  2003    Left ankle  . Tympanostomy tube placement  1993  . Ankle arthroscopy  01/17/2012    Procedure: ANKLE ARTHROSCOPY;  Surgeon: [name], MD;  Location: [place];  Service:  Orthopedics;  Laterality: Left;  . Inner ear surgery    . Ankle fusion Left 11/2012    Dr Sharol Given  . Orif ankle fracture Left 12/08/2012    Procedure: OPEN REDUCTION INTERNAL FIXATION (ORIF) ANKLE FRACTURE;  Surgeon: [name], MD;  Location: [place];  Service: Orthopedics;  Laterality: Left;  Removal Deep Hardware, Take Down Non-Union, Revision Internal Fixation Left Ankle  . Hardware removal Left 12/08/2012    Procedure: HARDWARE REMOVAL;  Surgeon: [name], MD;  Location: [place];  Service: Orthopedics;  Laterality: Left;  Removal Deep Hardware, Take Down Non-Union, Revision Internal Fixation Left Ankle  . I&d extremity Left 01/01/2013    Procedure: IRRIGATION AND DEBRIDEMENT EXTREMITY;  Surgeon: [name], MD;  Location: [place];  Service: Orthopedics;  Laterality: Left;  Irrigation, Debridement and Placement Antibiotic Beads Left Ankle  . Open reduction internal fixation (orif) scaphoid with distal radius graft Right 02/01/2013    Procedure: OPEN REDUCTION INTERNAL FIXATION (ORIF) RIGHT SCAPHOID FRACTURE WITH DISTAL RADIUS GRAFT;  Surgeon: Schuyler Amor, MD;  Location: [place];  Service: Orthopedics;  Laterality: Right;  . Scaphoid fracture surgery  02/04/2013  . Orif ankle fracture Right 09/03/2013    Procedure: OPEN REDUCTION INTERNAL FIXATION (ORIF) ANKLE FRACTURE;  Surgeon: [name], MD;  Location: [place];  Service: Orthopedics;  Laterality: Right;  Open Reduction Internal Fixation Right Fibula    FAMILY HISTORY: Family History  Problem Relation Age of Onset  . Hypertension Mother     SOCIAL HISTORY:  History   Social History  . Marital Status: Married    Spouse Name: Vickie    Number of Children: 1  . Years of Education: 12   Occupational History  .      Disabled   Social History Main Topics  . Smoking status: Current Every Day Smoker -- 1.50 packs/day for 30 years    Types: Cigarettes  . Smokeless tobacco: Never Used  . Alcohol Use: No     Comment: quit 2013    . Drug Use: No  . Sexual Activity: Not on file   Other Topics Concern  . Not on file   Social History Narrative   Patient lives at home with his wife Maxwell Caul)    Disabled   Left handed   Education 12 th    Caffeine  Coffee four cups     PHYSICAL EXAM   Filed Vitals:   03/17/14 1052  BP: 126/78  Pulse: 92  Height: 6\' 1"  (1.854 m)  Weight: 232 lb (105.235 kg)    Not recorded    Body mass index is 30.62 kg/(m^2).   Generalized: In no acute distress  Neck: Supple, no carotid bruits   Cardiac: Regular rate rhythm  Pulmonary: Clear to auscultation bilaterally  Musculoskeletal: No deformity  Neurological examination  Mentation: Alert oriented to time, place, history taking, and causual conversation, Impulsive  Cranial nerve II-XII: Pupils were equal round reactive to light. Extraocular movements were full.  Visual field were full on confrontational test. Bilateral fundi were sharp.  Facial sensation and strength were normal. Hearing was intact to finger rubbing bilaterally. Uvula tongue midline.  Head turning and shoulder shrug and were normal and symmetric.Tongue protrusion into cheek strength was normal.  Motor: Normal tone, bulk and strength.  Sensory: Intact to fine touch, pinprick, preserved vibratory sensation, and proprioception at toes.  Coordination: Normal finger to nose, heel-to-shin bilaterally there was no truncal ataxia  Gait: Rising up from seated positionby pushing on a chair arm, atalgic due to his ankle pain, unsteady,  Romberg signs: Negative  Deep tendon reflexes: Hyperreflexic and symmetric, plantar responses were flexor bilaterally.   DIAGNOSTIC DATA (LABS, IMAGING, TESTING) - I reviewed patient records, labs, notes, testing and imaging myself where available.  Lab Results  Component Value Date   WBC 11.5* 11/22/2013   HGB 17.7* 11/22/2013   HCT 51.6 11/22/2013   MCV 85.9 11/22/2013   PLT 252 11/22/2013      Component Value Date/Time   NA  142 11/22/2013 1723   K 4.4 11/22/2013 1723   CL 109 11/22/2013 1723   CO2 22 11/22/2013 1723   GLUCOSE 95 11/22/2013 1723   BUN 15 11/22/2013 1723   CREATININE 1.06 11/22/2013 1723   CREATININE 0.87 09/03/2013 1228   CALCIUM 9.4 11/22/2013 1723   PROT 6.9 11/22/2013 1723   ALBUMIN 4.7 11/22/2013 1723   AST 16 11/22/2013 1723   ALT 16 11/22/2013 1723   ALKPHOS 80 11/22/2013 1723   BILITOT 0.3 11/22/2013 1723   GFRNONAA 82 11/22/2013 1723   GFRNONAA >90 09/03/2013 1228   GFRAA >89 11/22/2013 1723   GFRAA >90 09/03/2013 1228   Lab Results  Component Value Date   CHOL 159 11/22/2013   HDL 29* 11/22/2013   LDLCALC 86 11/22/2013   TRIG 219* 11/22/2013   CHOLHDL 5.5 11/22/2013   Lab Results  Component Value Date   HGBA1C 5.9* 11/22/2013   No results found for this basename: [id number]   Lab Results  Component Value Date   TSH 0.845 11/22/2013      ASSESSMENT AND PLAN  Harold Bailey is a 48 y.o. male with  lesion in the past, biopsy proven meningeal encephalitis,  1 presenting with complex partial seizure,keep Vimpat 100 mg twice a day, Topamax 100mg  bid. 2 Maxalt 5mg  prn for his headache. 3. Refer him to [facility] Neurologist to continued care of his  inflammatory meningoencephalitis vs slow growing tumor 4. RTC in 3 months, may consider Botox injection for his chronic headaches, preauthorization process  Marcial Pacas, M.D. Ph.D.  [facility] [address] [place], [place] [address] [phone]

## 2014-03-30 ENCOUNTER — Ambulatory Visit (INDEPENDENT_AMBULATORY_CARE_PROVIDER_SITE_OTHER): Payer: Medicare Other | Admitting: Neurology

## 2014-03-30 ENCOUNTER — Encounter: Payer: Self-pay | Admitting: Neurology

## 2014-03-30 DIAGNOSIS — G4452 New daily persistent headache (NDPH): Secondary | ICD-10-CM

## 2014-03-30 DIAGNOSIS — I10 Essential (primary) hypertension: Secondary | ICD-10-CM

## 2014-03-30 DIAGNOSIS — D496 Neoplasm of unspecified behavior of brain: Secondary | ICD-10-CM

## 2014-03-30 DIAGNOSIS — G43719 Chronic migraine without aura, intractable, without status migrainosus: Secondary | ICD-10-CM

## 2014-03-30 MED ORDER — ONABOTULINUMTOXINA 100 UNITS IJ SOLR: 200.0000 [IU] | Freq: Once | INTRAMUSCULAR | Status: DC

## 2014-03-30 NOTE — Progress Notes (Signed)
PATIENT: Harold Bailey DOB: Apr 07, 1966  HISTORICAL ( initial visit Feb 2015)  Harold Bailey is a 48 years old left-handed male, referred by his primary care physician PA Vicie Mutters, Dr. Sandi Mariscal, for evaluation of worsening gait difficulty, last visit 08/2012.  In May 2009 he presented with confusion, unsteady gait, had MRI brain w/wo in [facility] imaging in May 2009, There is a large enhancing mass centered at the splenium of the corpus callosum. The mass has irregular enhancement of the wall with central nonenhancing necrosis. There is a mild amount of  hemorrhage associated with the mass. The mass measures 29 x 46 mm in transverse dimension and 37 mm in cranial caudal dimension.  There are small enhancing satellite nodules posterior to the mass on the right. There is a moderate amount of vasogenic edema in the white matter right greater than left. There is some obstruction of the temporal horn the right lateral ventricle due to the mass. The tumor is bilateral but more extensive on the right.  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 He has few follow up at [facility], He reports more frequent headache. He has been using Percocet for back pain and headache, also complains of intermittent blacking out spells.  Most recent MRI brian was in  July 2012 at [facility], Postsurgical changes of right posterior parietal approach biopsy of the corpus callosum with gliosis surrounding the biopsy tract. Nodular area of enhancement within the corpus callosum  not definitely present previously.There is increased FLAIR signal in this region compatible with edema. Scattered  white matter lesions are similar to previous study and borderline increased in number for patient's age. Grossly normal flow-related signal in the major intracranial arteries and dural sinuses. 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 Interval increase in nodular enhancement within the posterior splenium of the corpus callosum with increased surrounding FLAIR signal compatible with edema. Findings may represent disease progression or a new small adjacent lesion. The differential includes recurrent demyelinating or inflammatory disease such as multiple sclerosis. Tumor is not excluded. Scattered additional white matter lesions are unchanged from previous study and borderline increase in number for patient's age.  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  In last office visit in April 2014, He was given Topamax 100mg  bid, it was helping him, he had less blacking out episodes, but he quit taking it after a few months, in November 13, 2013, he had a witnessed episode, He was so spaced out, urinate on himself, body shaking, he later also had multiple episodes of lookging to the left side, tremor, heart racing, could not response, lasting 10 minutes,  He continued to have ankle pain, gait difficulty,   UPDATE June 25th 2015: Repeat scan in March 2015 showed encephalomalacia  and gliosis in the right medial parietal periventricular region without any enhancement. The mild changes of chronic  microvascular ischemia and paranasal sinusitis. Overall significant improvement compared with previous MRI scan dated 02/02/2008  He continued to complains of daily moderate severe headaches, has taken multiple dose of ibuprofen every day, and also BC powders. He is also taking Topamax 100 mg twice a day, previously he was under [facility] neurosurgeon Dr. Marygrace Drought care, was given prescription of Percocet daily, he could not no longer continue his care at [facility] per patient, because the change of his insurance,  He also has episodes consistent with complex partial seizure, Vimpat 100 twice, Topamax 100mg  twice a day was started, no seizure like event noted.  He works around house, [employer] the yard, Education administrator the house.  UPDATE July 8th 2015:  He came in with his wife today, for Botox injection for headache prevention, this is his first injection  REVIEW OF SYSTEMS: Full 14 system review of systems performed, positive as above.   ALLERGIES: No Known Allergies  HOME MEDICATIONS: Current Outpatient Prescriptions on File Prior to Visit  Medication Sig Dispense Refill  . ADVAIR DISKUS 250-50 MCG/DOSE AEPB       . ALPRAZolam (XANAX) 1 MG tablet Take 1 tablet (1 mg total) by mouth 3 (three) times daily as needed for anxiety.  90 tablet  0  . aspirin 81 MG tablet Take 81 mg by mouth daily.      Marland Kitchen atenolol (TENORMIN) 100 MG tablet Take 100 mg by mouth daily.      . bisoprolol (ZEBETA) 10 MG tablet Take 10 mg by mouth daily.      . celecoxib (CELEBREX) 100 MG capsule Take 100 mg by mouth 2 (two) times daily.      . clonazePAM (KLONOPIN) 1 MG tablet Take 1 tablet (1 mg total) by mouth 2 (two) times daily as needed for anxiety.  6 tablet  0  . cyclobenzaprine (FLEXERIL) 10 MG tablet TAKE 1 TABLET BY MOUTH THREE TIMES DAILY AS NEEDED FOR MUSCLE SPASMS OR PAIN  60 tablet  0  . doxycycline (VIBRA-TABS) 100 MG tablet Take 1 tablet (100 mg total) by mouth 2 (two) times daily.  20 tablet  0  . FLOVENT  HFA 110 MCG/ACT inhaler       . ibuprofen (ADVIL,MOTRIN) 800 MG tablet Take 1 tablet (800 mg total) by mouth 3 (three) times daily.  21 tablet  0  . Lacosamide (VIMPAT) 100 MG TABS Take 1 tablet (100 mg total) by mouth 2 (two) times daily.  60 tablet  12  . Magnesium 250 MG TABS Take by mouth daily.      . Multiple Vitamin (MULTIVITAMIN WITH MINERALS) TABS Take 1 tablet by mouth daily.      . mupirocin ointment (BACTROBAN) 2 %       . omega-3 acid ethyl esters (LOVAZA) 1 G capsule Take 1 g by mouth daily.      Marland Kitchen oxyCODONE-acetaminophen (PERCOCET) 10-325 MG per tablet Take one tablet by mouth every four hours as needed for pain  180 tablet  0  . rizatriptan (MAXALT-MLT) 5 MG disintegrating tablet Take 1 tablet (5 mg total) by mouth as needed for migraine. May repeat in 2 hours if needed  15 tablet  12  . sertraline (ZOLOFT) 50 MG tablet TAKE 1 TABLET BY MOUTH DAILY  90 tablet  0  . topiramate (TOPAMAX) 100 MG tablet TAKE 1 TABLET BY MOUTH TWICE DAILY  180  tablet  3  . VOLTAREN 1 % GEL        No current facility-administered medications on file prior to visit.    PAST MEDICAL HISTORY: Past Medical History  Diagnosis Date  . Shortness of breath     Hx of smoking.  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  . Polycythemia   . 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 Hypertension   . GERD (gastroesophageal reflux disease)   . Anxiety   . Arthritis     Left ankle  . Hyperlipidemia   . Prediabetes   . Drug abuse   . Memory loss   . HA (headache)   . Gait disturbance   . Seizures 11/19/2013    PAST SURGICAL HISTORY: Past Surgical History  Procedure Laterality Date  . Ankle surgery  2003    Left  . Shoulder arthroscopy  2008    Left  . Brain surgery  2009    Biopsy  . Fracture surgery  2003    Left ankle  . Tympanostomy tube placement  1993  .  Ankle arthroscopy  01/17/2012    Procedure: ANKLE ARTHROSCOPY;  Surgeon: [name], MD;  Location: [place];  Service: Orthopedics;  Laterality: Left;  . Inner ear surgery    . Ankle fusion Left 11/2012    Dr Sharol Given  . Orif ankle fracture Left 12/08/2012    Procedure: OPEN REDUCTION INTERNAL FIXATION (ORIF) ANKLE FRACTURE;  Surgeon: [name], MD;  Location: [place];  Service: Orthopedics;  Laterality: Left;  Removal Deep Hardware, Take Down Non-Union, Revision Internal Fixation Left Ankle  . Hardware removal Left 12/08/2012    Procedure: HARDWARE REMOVAL;  Surgeon: [name], MD;  Location: [place];  Service: Orthopedics;  Laterality: Left;  Removal Deep Hardware, Take Down Non-Union, Revision Internal Fixation Left Ankle  . I&d extremity Left 01/01/2013    Procedure: IRRIGATION AND DEBRIDEMENT EXTREMITY;  Surgeon: [name], MD;  Location: [place];  Service: Orthopedics;  Laterality: Left;  Irrigation, Debridement and Placement Antibiotic Beads Left Ankle  . Open reduction internal fixation (orif) scaphoid with distal radius graft Right 02/01/2013    Procedure: OPEN REDUCTION INTERNAL FIXATION (ORIF) RIGHT SCAPHOID FRACTURE WITH DISTAL RADIUS GRAFT;  Surgeon: Schuyler Amor, MD;  Location: [place];  Service: Orthopedics;  Laterality: Right;  . Scaphoid fracture surgery  02/04/2013  . Orif ankle fracture Right 09/03/2013    Procedure: OPEN REDUCTION INTERNAL FIXATION (ORIF) ANKLE FRACTURE;  Surgeon: [name], MD;  Location: [place];  Service: Orthopedics;  Laterality: Right;  Open Reduction Internal Fixation Right Fibula    FAMILY HISTORY: Family History  Problem Relation Age of Onset  . Hypertension Mother     SOCIAL HISTORY:  History   Social History  . Marital Status: Married    Spouse Name: Harold Bailey    Number of Children: 1  . Years of Education: 12   Occupational History  .      Disabled   Social History Main Topics  . Smoking status: Current Every Day Smoker -- 1.50  packs/day for 30 years    Types: Cigarettes  . Smokeless tobacco: Never Used  . Alcohol Use: No     Comment: quit 2013  . Drug Use: No  . Sexual Activity:  Not on file   Other Topics Concern  . Not on file   Social History Narrative   Patient lives at home with his wife Harold Bailey)    Disabled   Left handed   Education 12 th    Caffeine Coffee four cups     PHYSICAL EXAM   Filed Vitals:    Not recorded    Cannot calculate BMI with a height equal to zero.   Generalized: In no acute distress  Neck: Supple, no carotid bruits   Cardiac: Regular rate rhythm  Pulmonary: Clear to auscultation bilaterally  Musculoskeletal: No deformity  Neurological examination  Mentation: Alert oriented to time, place, history taking, and causual conversation, Impulsive  Cranial nerve II-XII: Pupils were equal round reactive to light. Extraocular movements were full.  Visual field were full on confrontational test. Bilateral fundi were sharp.  Facial sensation and strength were normal. Hearing was intact to finger rubbing bilaterally. Uvula tongue midline.  Head turning and shoulder shrug and were normal and symmetric.Tongue protrusion into cheek strength was normal.  Motor: Normal tone, bulk and strength.  Sensory: Intact to fine touch, pinprick, preserved vibratory sensation, and proprioception at toes.  Coordination: Normal finger to nose, heel-to-shin bilaterally there was no truncal ataxia  Gait: Rising up from seated positionby pushing on a chair arm, atalgic due to his ankle pain, unsteady,  Romberg signs: Negative  Deep tendon reflexes: Hyperreflexic and symmetric, plantar responses were flexor bilaterally.   DIAGNOSTIC DATA (LABS, IMAGING, TESTING) - I reviewed patient records, labs, notes, testing and imaging myself where available.  Lab Results  Component Value Date   WBC 11.5* 11/22/2013   HGB 17.7* 11/22/2013   HCT 51.6 11/22/2013   MCV 85.9 11/22/2013   PLT 252 11/22/2013        Component Value Date/Time   NA 142 11/22/2013 1723   K 4.4 11/22/2013 1723   CL 109 11/22/2013 1723   CO2 22 11/22/2013 1723   GLUCOSE 95 11/22/2013 1723   BUN 15 11/22/2013 1723   CREATININE 1.06 11/22/2013 1723   CREATININE 0.87 09/03/2013 1228   CALCIUM 9.4 11/22/2013 1723   PROT 6.9 11/22/2013 1723   ALBUMIN 4.7 11/22/2013 1723   AST 16 11/22/2013 1723   ALT 16 11/22/2013 1723   ALKPHOS 80 11/22/2013 1723   BILITOT 0.3 11/22/2013 1723   GFRNONAA 82 11/22/2013 1723   GFRNONAA >90 09/03/2013 1228   GFRAA >89 11/22/2013 1723   GFRAA >90 09/03/2013 1228   Lab Results  Component Value Date   CHOL 159 11/22/2013   HDL 29* 11/22/2013   LDLCALC 86 11/22/2013   TRIG 219* 11/22/2013   CHOLHDL 5.5 11/22/2013   Lab Results  Component Value Date   HGBA1C 5.9* 11/22/2013   No results found for this basename: [id number]   Lab Results  Component Value Date   TSH 0.845 11/22/2013      ASSESSMENT AND PLAN  Harold Bailey is a 48 y.o. male with  lesion in the past, biopsy proven meningeal encephalitis,  1 presenting with complex partial seizure,keep Vimpat 100 mg twice a day, Topamax 100mg  bid. 2 Maxalt 5mg  prn for his headache.  Botox injection for chronic headaches prevention.  BOTOX injection was performed according to protocol by [employer]  Used 200 units.   Corrugator 2 sites, 10 units Procerus 1 site, 5 unit Frontalis 4 sites,  20 units, Temporalis 8 sites,  40 units  Occipitalis 6 sites, 30 units Cervical Paraspinal, 4 sites, 20  units Trapezius, 6 sites, 30 units  Extra cervical paraspinals 45 units  Patient tolerate the injection well. Will return for repeat injection in 3 months.  Marcial Pacas, M.D. Ph.D.  [facility] [address], [place], [place] [address] [phone]

## 2014-03-31 ENCOUNTER — Telehealth: Payer: Self-pay

## 2014-03-31 NOTE — Telephone Encounter (Signed)
Make sure he will have follow up appt with [place] for repeat BOTOX injections

## 2014-03-31 NOTE — Telephone Encounter (Signed)
[facility]  Declined  Patients referral stating there is nothing they will be able to offer the patient at this time. Please see [facility] not in your inbox.

## 2014-04-01 NOTE — Telephone Encounter (Signed)
Called and spoke to patient relayed to him about [facility] and he understood and also told him to keep his Botox appt's every three months.

## 2014-04-08 ENCOUNTER — Ambulatory Visit: Payer: Self-pay | Admitting: Neurology

## 2014-04-08 ENCOUNTER — Other Ambulatory Visit: Payer: Self-pay | Admitting: Internal Medicine

## 2014-04-13 ENCOUNTER — Other Ambulatory Visit: Payer: Self-pay | Admitting: Internal Medicine

## 2014-06-01 ENCOUNTER — Other Ambulatory Visit: Payer: Self-pay | Admitting: Internal Medicine

## 2014-06-17 ENCOUNTER — Ambulatory Visit: Payer: Medicare Other | Admitting: Neurology

## 2014-06-29 ENCOUNTER — Other Ambulatory Visit: Payer: Self-pay | Admitting: Internal Medicine

## 2014-06-30 ENCOUNTER — Other Ambulatory Visit: Payer: Self-pay | Admitting: Internal Medicine

## 2014-07-14 ENCOUNTER — Telehealth: Payer: Self-pay | Admitting: Neurology

## 2014-07-14 NOTE — Telephone Encounter (Signed)
Patient calling to state that someone from our office was trying to contact him to schedule an appointment, please return call and advise.

## 2014-07-15 ENCOUNTER — Ambulatory Visit: Payer: Medicare Other | Admitting: Neurology

## 2014-07-18 NOTE — Telephone Encounter (Signed)
I checked around the office no one had called him. I left patient a message stating if he needed use to please call us back we would be glad to help him.

## 2014-08-15 ENCOUNTER — Encounter: Payer: Self-pay | Admitting: Physician Assistant

## 2014-09-09 ENCOUNTER — Telehealth: Payer: Self-pay

## 2014-09-09 ENCOUNTER — Telehealth: Payer: Self-pay | Admitting: Neurology

## 2014-09-09 NOTE — Telephone Encounter (Signed)
Patient MCD will be effective 09/23/14, but patient has 3 NO SHOWS.  Questioning if patient is dismissed from practice.  Please advise.

## 2014-09-09 NOTE — Telephone Encounter (Signed)
I left patient a voicemail stating I will send his message to billing [facility]. Please give use until next week to give him a call back.

## 2014-09-09 NOTE — Telephone Encounter (Signed)
Patient wants to know if he can still be seen 3- No show's

## 2014-09-14 NOTE — Telephone Encounter (Signed)
The patient has a collection balance of $284.83 that he needs to make a payment on. As far as being dismissed, that would be Dr. Rhea Belton call. I've not been given direction to dismiss him. We do general dismiss after 3 no shows. Thanks Angie

## 2014-09-20 NOTE — Telephone Encounter (Signed)
Called patient and left him a message asking him to call the billing Dept. To set up payment arrangement and we will be glad to get him scheduled for a follow with Dr.Yan $284.83 balance needs to be paid on.

## 2014-10-23 ENCOUNTER — Other Ambulatory Visit: Payer: Self-pay | Admitting: Physician Assistant

## 2014-11-08 ENCOUNTER — Telehealth: Payer: Self-pay | Admitting: *Deleted

## 2014-11-08 ENCOUNTER — Institutional Professional Consult (permissible substitution): Payer: Medicare Other | Admitting: Neurology

## 2014-11-08 NOTE — Telephone Encounter (Signed)
No Showed appointment.

## 2014-11-09 ENCOUNTER — Telehealth: Payer: Self-pay | Admitting: *Deleted

## 2014-11-09 NOTE — Telephone Encounter (Signed)
Dismiss per Dr. Krista Blue - multiple no-shows.

## 2014-11-09 NOTE — Telephone Encounter (Signed)
Per Dr. Krista Blue, dismiss due to history of no-showed appt (11/08/14, 03/08/14, 12/28/13, 11/18/13) plus multiple cancellations.

## 2014-11-10 ENCOUNTER — Encounter: Payer: Self-pay | Admitting: Neurology

## 2014-11-17 ENCOUNTER — Institutional Professional Consult (permissible substitution): Payer: Medicare Other | Admitting: Neurology

## 2014-12-14 ENCOUNTER — Encounter (HOSPITAL_COMMUNITY): Payer: Self-pay | Admitting: Physical Medicine and Rehabilitation

## 2014-12-14 ENCOUNTER — Other Ambulatory Visit: Payer: Self-pay | Admitting: Internal Medicine

## 2014-12-14 ENCOUNTER — Emergency Department (HOSPITAL_COMMUNITY)
Admission: EM | Admit: 2014-12-14 | Discharge: 2014-12-14 | Disposition: A | Discharge status: Home / Self Care | Payer: Medicare Other | Attending: Emergency Medicine | Admitting: Emergency Medicine

## 2014-12-14 DIAGNOSIS — M1990 Unspecified osteoarthritis, unspecified site: Secondary | ICD-10-CM | POA: Insufficient documentation

## 2014-12-14 DIAGNOSIS — Z86011 Personal history of benign neoplasm of the brain: Secondary | ICD-10-CM | POA: Insufficient documentation

## 2014-12-14 DIAGNOSIS — I10 Essential (primary) hypertension: Secondary | ICD-10-CM | POA: Insufficient documentation

## 2014-12-14 DIAGNOSIS — H53452 Other localized visual field defect, left eye: Secondary | ICD-10-CM | POA: Insufficient documentation

## 2014-12-14 DIAGNOSIS — M25571 Pain in right ankle and joints of right foot: Secondary | ICD-10-CM | POA: Diagnosis not present

## 2014-12-14 DIAGNOSIS — Z720 Tobacco use: Secondary | ICD-10-CM | POA: Insufficient documentation

## 2014-12-14 DIAGNOSIS — F419 Anxiety disorder, unspecified: Secondary | ICD-10-CM | POA: Insufficient documentation

## 2014-12-14 DIAGNOSIS — M25572 Pain in left ankle and joints of left foot: Secondary | ICD-10-CM | POA: Insufficient documentation

## 2014-12-14 DIAGNOSIS — K219 Gastro-esophageal reflux disease without esophagitis: Secondary | ICD-10-CM | POA: Insufficient documentation

## 2014-12-14 DIAGNOSIS — R51 Headache: Secondary | ICD-10-CM | POA: Insufficient documentation

## 2014-12-14 DIAGNOSIS — Z862 Personal history of diseases of the blood and blood-forming organs and certain disorders involving the immune mechanism: Secondary | ICD-10-CM | POA: Insufficient documentation

## 2014-12-14 DIAGNOSIS — Z79899 Other long term (current) drug therapy: Secondary | ICD-10-CM | POA: Diagnosis not present

## 2014-12-14 DIAGNOSIS — Z7951 Long term (current) use of inhaled steroids: Secondary | ICD-10-CM | POA: Diagnosis not present

## 2014-12-14 DIAGNOSIS — Z792 Long term (current) use of antibiotics: Secondary | ICD-10-CM | POA: Insufficient documentation

## 2014-12-14 DIAGNOSIS — G40909 Epilepsy, unspecified, not intractable, without status epilepticus: Secondary | ICD-10-CM | POA: Diagnosis not present

## 2014-12-14 DIAGNOSIS — Z8639 Personal history of other endocrine, nutritional and metabolic disease: Secondary | ICD-10-CM | POA: Insufficient documentation

## 2014-12-14 DIAGNOSIS — Z791 Long term (current) use of non-steroidal anti-inflammatories (NSAID): Secondary | ICD-10-CM | POA: Diagnosis not present

## 2014-12-14 DIAGNOSIS — R519 Headache, unspecified: Secondary | ICD-10-CM

## 2014-12-14 MED ORDER — OXYCODONE-ACETAMINOPHEN 5-325 MG PO TABS: 1.0000 | ORAL_TABLET | Freq: Four times a day (QID) | ORAL | Status: DC | PRN

## 2014-12-14 MED ORDER — OXYCODONE-ACETAMINOPHEN 5-325 MG PO TABS
1.0000 | ORAL_TABLET | Freq: Once | ORAL | Status: AC
  Administered 2014-12-14: 1 via ORAL
  Filled 2014-12-14: qty 1

## 2014-12-14 NOTE — ED Notes (Signed)
Pt presents to department for evaluation of headache. History of brain tumors, pt reports increased headaches. 8/10 pain upon arrival to ED. Pt is alert and oriented x4.

## 2014-12-14 NOTE — ED Provider Notes (Signed)
CSN: [id number]     Arrival date & time 12/14/14  1033 History   First MD Initiated Contact with Patient 12/14/14 1102     Chief Complaint  Patient presents with  . Headache     (Consider location/radiation/quality/duration/timing/severity/associated sxs/prior Treatment) HPI   The patient is a 49 y/o male with a brain tumor who presents to the emergency department with a frontal headache. He states that this is due to his brain tumor, diagnosed in 2009, and he usually has pain medication (Percocet) but was recently fired by his neurologist Dr. Krista Blue for no showing an appointment and he is trying to find a new neurologist. The headache is frontal, throbbing, does not radiate. He endorses no aggravating factors. It is slightly relieved with BC powders, but he has taken 50 over the course of 3 days and feels that this is excessive. He denies vision change from baseline, which is a left-sided field deficit attributed to his brain tumor. He denies photophobia, phonophobia, fevers, chills, numbness/tingling, weakness, LOC, confusion, recent memory loss. 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 He walks with a cane. He also endorses left ankle pain and states that he needs another surgery for this but cannot get in to see his orthopedist for another 2 weeks.     Past Medical History  Diagnosis Date  . Shortness of breath     Hx of smoking.  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  . Polycythemia   . 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 Hypertension   . GERD (gastroesophageal reflux disease)   . Anxiety   . Arthritis     Left ankle  . Hyperlipidemia   . Prediabetes   . Drug abuse   . Memory loss   . HA (headache)   . Gait disturbance   . Seizures 11/19/2013   Past Surgical History   Procedure Laterality Date  . Ankle surgery  2003    Left  . Shoulder arthroscopy  2008    Left  . Brain surgery  2009    Biopsy  . Fracture surgery  2003    Left ankle  . Tympanostomy tube placement  1993  . Ankle arthroscopy  01/17/2012    Procedure: ANKLE ARTHROSCOPY;  Surgeon: [name], MD;  Location: [place];  Service: Orthopedics;  Laterality: Left;  . Inner ear surgery    . Ankle fusion Left 11/2012    Dr Sharol Given  . Orif ankle fracture Left 12/08/2012    Procedure: OPEN REDUCTION INTERNAL FIXATION (ORIF) ANKLE FRACTURE;  Surgeon: [name], MD;  Location: [place];  Service: Orthopedics;  Laterality: Left;  Removal Deep Hardware, Take Down Non-Union, Revision Internal Fixation Left Ankle  . Hardware removal Left 12/08/2012    Procedure: HARDWARE REMOVAL;  Surgeon: [name], MD;  Location: [place];  Service: Orthopedics;  Laterality: Left;  Removal Deep Hardware, Take Down Non-Union, Revision Internal Fixation Left Ankle  . I&d extremity Left 01/01/2013    Procedure: IRRIGATION AND DEBRIDEMENT EXTREMITY;  Surgeon: [name], MD;  Location: [place];  Service: Orthopedics;  Laterality: Left;  Irrigation, Debridement and Placement Antibiotic Beads Left Ankle  . Open reduction internal fixation (orif) scaphoid with distal  radius graft Right 02/01/2013    Procedure: OPEN REDUCTION INTERNAL FIXATION (ORIF) RIGHT SCAPHOID FRACTURE WITH DISTAL RADIUS GRAFT;  Surgeon: Schuyler Amor, MD;  Location: [place];  Service: Orthopedics;  Laterality: Right;  . Scaphoid fracture surgery  02/04/2013  . Orif ankle fracture Right 09/03/2013    Procedure: OPEN REDUCTION INTERNAL FIXATION (ORIF) ANKLE FRACTURE;  Surgeon: [name], MD;  Location: [place];  Service: Orthopedics;  Laterality: Right;  Open Reduction Internal Fixation Right Fibula   Family History  Problem Relation Age of Onset  . Hypertension Mother    History  Substance Use Topics  . Smoking status: Current Every Day Smoker -- 1.50  packs/day for 30 years    Types: Cigarettes  . Smokeless tobacco: Never Used  . Alcohol Use: No     Comment: quit 2013    Review of Systems  All other systems negative except as documented in the HPI. All pertinent positives and negatives as reviewed in the HPI.   Allergies  Review of patient's allergies indicates no known allergies.  Home Medications   Prior to Admission medications   Medication Sig Start Date End Date Taking? Authorizing Provider  ADVAIR DISKUS 250-50 MCG/DOSE AEPB  08/24/13   Historical Provider, MD  ALPRAZolam Duanne Moron) 1 MG tablet Take 1 tablet (1 mg total) by mouth 3 (three) times daily as needed for anxiety. 11/24/13   Kelby Aline, PA-C  aspirin 81 MG tablet Take 81 mg by mouth daily.    Historical Provider, MD  atenolol (TENORMIN) 100 MG tablet Take 100 mg by mouth daily. 08/12/13   Historical Provider, MD  bisoprolol (ZEBETA) 10 MG tablet Take 10 mg by mouth daily.    Historical Provider, MD  celecoxib (CELEBREX) 100 MG capsule Take 100 mg by mouth 2 (two) times daily.    Historical Provider, MD  clonazePAM (KLONOPIN) 1 MG tablet Take 1 tablet (1 mg total) by mouth 2 (two) times daily as needed for anxiety. 11/19/13   Marcial Pacas, MD  cyclobenzaprine (FLEXERIL) 10 MG tablet TAKE 1 TABLET BY MOUTH THREE TIMES DAILY AS NEEDED FOR MUSCLE SPASMS OR PAIN 12/01/13   Vicie Mutters, PA-C  doxycycline (VIBRA-TABS) 100 MG tablet Take 1 tablet (100 mg total) by mouth 2 (two) times daily. 11/23/13   Kelby Aline, PA-C  FLOVENT HFA 110 MCG/ACT inhaler INHALE 1 PUFF TWICE DAILY 10/23/14   Vicie Mutters, PA-C  ibuprofen (ADVIL,MOTRIN) 800 MG tablet TAKE 1 TABLET BY MOUTH EVERY 8 HOURS AS NEEDED FOR PAIN 06/29/14   Unk Pinto, MD  Lacosamide (VIMPAT) 100 MG TABS Take 1 tablet (100 mg total) by mouth 2 (two) times daily. 03/17/14   Marcial Pacas, MD  Magnesium 250 MG TABS Take by mouth daily.    Historical Provider, MD  Multiple Vitamin (MULTIVITAMIN WITH MINERALS) TABS Take 1 tablet  by mouth daily.    Historical Provider, MD  mupirocin ointment (BACTROBAN) 2 %  08/30/13   Historical Provider, MD  omega-3 acid ethyl esters (LOVAZA) 1 G capsule Take 1 g by mouth daily.    Historical Provider, MD  oxyCODONE-acetaminophen (PERCOCET) 10-325 MG per tablet Take one tablet by mouth every four hours as needed for pain 09/13/13   Tiffany L Reed, DO  rizatriptan (MAXALT-MLT) 5 MG disintegrating tablet Take 1 tablet (5 mg total) by mouth as needed for migraine. May repeat in 2 hours if needed 03/17/14   Marcial Pacas, MD  sertraline (ZOLOFT) 50 MG tablet TAKE 1 TABLET BY MOUTH  DAILY    Melissa Smith, PA-C  topiramate (TOPAMAX) 100 MG tablet TAKE 1 TABLET BY MOUTH TWICE DAILY    Marcial Pacas, MD  VOLTAREN 1 % GEL  08/27/13   Historical Provider, MD   BP 134/103 mmHg  Pulse 117  Temp(Src) 98.8 F (37.1 C) (Oral)  Resp 18  SpO2 96% Physical Exam  Constitutional: He is oriented to person, place, and time. He appears well-developed and well-nourished. No distress.  HENT:  Head: Normocephalic and atraumatic.  Eyes: Pupils are equal, round, and reactive to light. Right eye exhibits abnormal extraocular motion. Right eye exhibits no nystagmus. Left eye exhibits abnormal extraocular motion. Left eye exhibits no nystagmus.  EOMs to the left are impaired due to visual field deficit  Neck: Normal range of motion. Neck supple. No tracheal deviation present. No thyromegaly present.  Cardiovascular: Intact distal pulses.   Musculoskeletal: He exhibits no edema or tenderness.       Right ankle: He exhibits decreased range of motion.       Left ankle: He exhibits decreased range of motion.  Neurological: He is alert and oriented to person, place, and time. He has normal strength. A cranial nerve deficit is present. No sensory deficit. Coordination normal.  Patient has a left sided visual field deficit in both eyes  Skin: Skin is warm and dry. No rash noted. He is not diaphoretic. No erythema.    ED  Course  Procedures (including critical care time) Patient would like neurology referral and pain medication and does not want further testing.  He would like to go home at this time  Dalia Heading, PA-C 12/14/14 [place], MD 12/17/14 [phone]

## 2014-12-14 NOTE — Discharge Instructions (Signed)
Return here as needed.  Follow-up with the neurologist provided

## 2015-01-15 ENCOUNTER — Emergency Department (HOSPITAL_COMMUNITY)
Admission: EM | Admit: 2015-01-15 | Discharge: 2015-01-15 | Disposition: A | Discharge status: Home / Self Care | Payer: Medicare Other | Attending: Emergency Medicine | Admitting: Emergency Medicine

## 2015-01-15 ENCOUNTER — Encounter (HOSPITAL_COMMUNITY): Payer: Self-pay | Admitting: Nurse Practitioner

## 2015-01-15 ENCOUNTER — Emergency Department (HOSPITAL_COMMUNITY): Payer: Medicare Other

## 2015-01-15 DIAGNOSIS — G40909 Epilepsy, unspecified, not intractable, without status epilepticus: Secondary | ICD-10-CM | POA: Diagnosis not present

## 2015-01-15 DIAGNOSIS — M19072 Primary osteoarthritis, left ankle and foot: Secondary | ICD-10-CM | POA: Insufficient documentation

## 2015-01-15 DIAGNOSIS — Z862 Personal history of diseases of the blood and blood-forming organs and certain disorders involving the immune mechanism: Secondary | ICD-10-CM | POA: Diagnosis not present

## 2015-01-15 DIAGNOSIS — Z720 Tobacco use: Secondary | ICD-10-CM | POA: Insufficient documentation

## 2015-01-15 DIAGNOSIS — I10 Essential (primary) hypertension: Secondary | ICD-10-CM | POA: Diagnosis not present

## 2015-01-15 DIAGNOSIS — S3991XA Unspecified injury of abdomen, initial encounter: Secondary | ICD-10-CM | POA: Diagnosis present

## 2015-01-15 DIAGNOSIS — F419 Anxiety disorder, unspecified: Secondary | ICD-10-CM | POA: Insufficient documentation

## 2015-01-15 DIAGNOSIS — Y999 Unspecified external cause status: Secondary | ICD-10-CM | POA: Diagnosis not present

## 2015-01-15 DIAGNOSIS — H53459 Other localized visual field defect, unspecified eye: Secondary | ICD-10-CM | POA: Diagnosis not present

## 2015-01-15 DIAGNOSIS — Z791 Long term (current) use of non-steroidal anti-inflammatories (NSAID): Secondary | ICD-10-CM | POA: Diagnosis not present

## 2015-01-15 DIAGNOSIS — Y929 Unspecified place or not applicable: Secondary | ICD-10-CM | POA: Insufficient documentation

## 2015-01-15 DIAGNOSIS — Z7982 Long term (current) use of aspirin: Secondary | ICD-10-CM | POA: Diagnosis not present

## 2015-01-15 DIAGNOSIS — Z7951 Long term (current) use of inhaled steroids: Secondary | ICD-10-CM | POA: Diagnosis not present

## 2015-01-15 DIAGNOSIS — F10120 Alcohol abuse with intoxication, uncomplicated: Secondary | ICD-10-CM | POA: Diagnosis not present

## 2015-01-15 DIAGNOSIS — Y939 Activity, unspecified: Secondary | ICD-10-CM | POA: Insufficient documentation

## 2015-01-15 DIAGNOSIS — S299XXA Unspecified injury of thorax, initial encounter: Secondary | ICD-10-CM | POA: Diagnosis not present

## 2015-01-15 DIAGNOSIS — S80811A Abrasion, right lower leg, initial encounter: Secondary | ICD-10-CM | POA: Diagnosis not present

## 2015-01-15 DIAGNOSIS — S301XXA Contusion of abdominal wall, initial encounter: Secondary | ICD-10-CM | POA: Diagnosis not present

## 2015-01-15 DIAGNOSIS — Z792 Long term (current) use of antibiotics: Secondary | ICD-10-CM | POA: Insufficient documentation

## 2015-01-15 DIAGNOSIS — Z8639 Personal history of other endocrine, nutritional and metabolic disease: Secondary | ICD-10-CM | POA: Insufficient documentation

## 2015-01-15 DIAGNOSIS — F10220 Alcohol dependence with intoxication, uncomplicated: Secondary | ICD-10-CM

## 2015-01-15 DIAGNOSIS — K219 Gastro-esophageal reflux disease without esophagitis: Secondary | ICD-10-CM | POA: Insufficient documentation

## 2015-01-15 LAB — RAPID URINE DRUG SCREEN, HOSP PERFORMED
Amphetamines: NOT DETECTED
BARBITURATES: NOT DETECTED
Benzodiazepines: POSITIVE — AB
COCAINE: NOT DETECTED
OPIATES: NOT DETECTED
Tetrahydrocannabinol: NOT DETECTED

## 2015-01-15 LAB — COMPREHENSIVE METABOLIC PANEL
ALT: 14 U/L (ref 0–53)
AST: 15 U/L (ref 0–37)
Albumin: 3.5 g/dL (ref 3.5–5.2)
Alkaline Phosphatase: 63 U/L (ref 39–117)
Anion gap: 12 (ref 5–15)
BUN: 8 mg/dL (ref 6–23)
CALCIUM: 8.8 mg/dL (ref 8.4–10.5)
CO2: 22 mmol/L (ref 19–32)
Chloride: 108 mmol/L (ref 96–112)
Creatinine, Ser: 0.95 mg/dL (ref 0.50–1.35)
GFR calc Af Amer: >90 mL/min (ref >90)
GLUCOSE: 120 mg/dL — AB (ref 70–99)
POTASSIUM: 3.8 mmol/L (ref 3.5–5.1)
Sodium: 142 mmol/L (ref 135–145)
Total Bilirubin: 0.5 mg/dL (ref 0.3–1.2)
Total Protein: 6.4 g/dL (ref 6.0–8.3)

## 2015-01-15 LAB — CBC WITH DIFFERENTIAL/PLATELET
BASOS ABS: 0 10*3/uL (ref 0.0–0.1)
Basophils Relative: 0 % (ref 0–1)
EOS ABS: 0.2 10*3/uL (ref 0.0–0.7)
Eosinophils Relative: 3 % (ref 0–5)
HCT: 47.3 % (ref 39.0–52.0)
Hemoglobin: 15.7 g/dL (ref 13.0–17.0)
Lymphocytes Relative: 22 % (ref 12–46)
Lymphs Abs: 1.7 10*3/uL (ref 0.7–4.0)
MCH: 30.5 pg (ref 26.0–34.0)
MCHC: 33.2 g/dL (ref 30.0–36.0)
MCV: 92 fL (ref 78.0–100.0)
MONO ABS: 0.4 10*3/uL (ref 0.1–1.0)
Monocytes Relative: 5 % (ref 3–12)
NEUTROS ABS: 5.3 10*3/uL (ref 1.7–7.7)
NEUTROS PCT: 70 % (ref 43–77)
PLATELETS: 159 10*3/uL (ref 150–400)
RBC: 5.14 MIL/uL (ref 4.22–5.81)
RDW: 14.4 % (ref 11.5–15.5)
WBC: 7.6 10*3/uL (ref 4.0–10.5)

## 2015-01-15 LAB — URINALYSIS, ROUTINE W REFLEX MICROSCOPIC
Bilirubin Urine: NEGATIVE
GLUCOSE, UA: NEGATIVE mg/dL
Hgb urine dipstick: NEGATIVE
Ketones, ur: NEGATIVE mg/dL
LEUKOCYTES UA: NEGATIVE
Nitrite: NEGATIVE
Protein, ur: NEGATIVE mg/dL
SPECIFIC GRAVITY, URINE: 1.014 (ref 1.005–1.030)
Urobilinogen, UA: 0.2 mg/dL (ref 0.0–1.0)
pH: 5.5 (ref 5.0–8.0)

## 2015-01-15 LAB — TROPONIN I

## 2015-01-15 LAB — ETHANOL: Alcohol, Ethyl (B): 147 mg/dL — ABNORMAL HIGH (ref 0–9)

## 2015-01-15 LAB — LIPASE, BLOOD: Lipase: 28 U/L (ref 11–59)

## 2015-01-15 MED ORDER — IOHEXOL 300 MG/ML  SOLN
100.0000 mL | Freq: Once | INTRAMUSCULAR | Status: AC | PRN
  Administered 2015-01-15: 100 mL via INTRAVENOUS

## 2015-01-15 MED ORDER — THIAMINE HCL 100 MG/ML IJ SOLN
Freq: Once | INTRAVENOUS | Status: AC
  Administered 2015-01-15: 13:00:00 via INTRAVENOUS
  Filled 2015-01-15: qty 1000
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
## 2015-01-15 NOTE — ED Notes (Signed)
Patient transported to CT 

## 2015-01-15 NOTE — ED Notes (Signed)
Pt requesting something for pain for d/c, MD reporting can get tylenol, pt refused this. Made aware pt HR 110 at time of dc.
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
## 2015-01-15 NOTE — ED Notes (Signed)
D&C IV 
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
## 2015-01-15 NOTE — Discharge Instructions (Signed)
Blunt Abdominal Trauma A blunt injury to the abdomen can cause pain. The pain is most likely from bruising and stretching of your muscles. This pain is often made worse with movement. Most often these injuries are not serious and get better within 1 week with rest and mild pain medicine. However, internal organs (liver, spleen, kidneys) can be injured with blunt trauma. If you do not get better or if you get worse, further examination may be needed. Continue with your regular daily activities, but avoid any strenuous activities until your pain is improved. If your stomach is upset, stick to a clear liquid diet and slowly advance to solid food.  SEEK IMMEDIATE MEDICAL CARE IF:   You develop increasing pain, nausea, or repeated vomiting.  You develop chest pain or breathing difficulty.  You develop blood in the urine, vomit, or stool.  You develop weakness, fainting, fever, or other serious complaints. Document Released: 10/17/2004 Document Revised: 12/02/2011 Document Reviewed: 02/02/2009 [facility] Patient Information 2015 [place], [place]. This information is not intended to replace advice given to you by your health care provider. Make sure you discuss any questions you have with your health care provider. Hematoma A hematoma is a collection of blood under the skin, in an organ, in a body space, in a joint space, or in other tissue. The blood can clot to form a lump that you can see and feel. The lump is often firm and may sometimes become sore and tender. Most hematomas get better in a few days to weeks. However, some hematomas may be serious and require medical care. Hematomas can range in size from very small to very large. CAUSES  A hematoma can be caused by a blunt or penetrating injury. It can also be caused by spontaneous leakage from a blood vessel under the skin. Spontaneous leakage from a blood vessel is more likely to occur in older people, especially those taking blood thinners.  Sometimes, a hematoma can develop after certain medical procedures. SIGNS AND SYMPTOMS   A firm lump on the body.  Possible pain and tenderness in the area.  Bruising.Blue, dark blue, purple-red, or yellowish skin may appear at the site of the hematoma if the hematoma is close to the surface of the skin. For hematomas in deeper tissues or body spaces, the signs and symptoms may be subtle. For example, an intra-abdominal hematoma may cause abdominal pain, weakness, fainting, and shortness of breath. An intracranial hematoma may cause a headache or symptoms such as weakness, trouble speaking, or a change in consciousness. DIAGNOSIS  A hematoma can usually be diagnosed based on your medical history and a physical exam. Imaging tests may be needed if your health care provider suspects a hematoma in deeper tissues or body spaces, such as the abdomen, head, or chest. These tests may include ultrasonography or a CT scan.  TREATMENT  Hematomas usually go away on their own over time. Rarely does the blood need to be drained out of the body. Large hematomas or those that may affect vital organs will sometimes need surgical drainage or monitoring. HOME CARE INSTRUCTIONS   Apply ice to the injured area:   Put ice in a plastic bag.   Place a towel between your skin and the bag.   Leave the ice on for 20 minutes, 2-3 times a day for the first 1 to 2 days.   After the first 2 days, switch to using warm compresses on the hematoma.   Elevate the injured area to help  decrease pain and swelling. Wrapping the area with an elastic bandage may also be helpful. Compression helps to reduce swelling and promotes shrinking of the hematoma. Make sure the bandage is not wrapped too tight.   If your hematoma is on a lower extremity and is painful, crutches may be helpful for a couple days.   Only take over-the-counter or prescription medicines as directed by your health care provider. SEEK IMMEDIATE MEDICAL  CARE IF:   You have increasing pain, or your pain is not controlled with medicine.   You have a fever.   You have worsening swelling or discoloration.   Your skin over the hematoma breaks or starts bleeding.   Your hematoma is in your chest or abdomen and you have weakness, shortness of breath, or a change in consciousness.  Your hematoma is on your scalp (caused by a fall or injury) and you have a worsening headache or a change in alertness or consciousness. MAKE SURE YOU:   Understand these instructions.  Will watch your condition.  Will get help right away if you are not doing well or get worse. Document Released: 04/23/2004 Document Revised: 05/12/2013 Document Reviewed: 02/17/2013 [facility] Patient Information 2015 [place], [place]. This information is not intended to replace advice given to you by your health care provider. Make sure you discuss any questions you have with your health care provider.  Alcohol Use Disorder Alcohol use disorder is a mental disorder. It is not a one-time incident of heavy drinking. Alcohol use disorder is the excessive and uncontrollable use of alcohol over time that leads to problems with functioning in one or more areas of daily living. People with this disorder risk harming themselves and others when they drink to excess. Alcohol use disorder also can cause other mental disorders, such as mood and anxiety disorders, and serious physical problems. People with alcohol use disorder often misuse other drugs.  Alcohol use disorder is common and widespread. Some people with this disorder drink alcohol to cope with or escape from negative life events. Others drink to relieve chronic pain or symptoms of mental illness. People with a family history of alcohol use disorder are at higher risk of losing control and using alcohol to excess.  SYMPTOMS  Signs and symptoms of alcohol use disorder may include the following:   Consumption ofalcohol inlarger  amounts or over a longer period of time than intended.  Multiple unsuccessful attempts to cutdown or control alcohol use.   A great deal of time spent obtaining alcohol, using alcohol, or recovering from the effects of alcohol (hangover).  A strong desire or urge to use alcohol (cravings).   Continued use of alcohol despite problems at work, school, or home because of alcohol use.   Continued use of alcohol despite problems in relationships because of alcohol use.  Continued use of alcohol in situations when it is physically hazardous, such as driving a car.  Continued use of alcohol despite awareness of a physical or psychological problem that is likely related to alcohol use. Physical problems related to alcohol use can involve the brain, heart, liver, stomach, and intestines. Psychological problems related to alcohol use include intoxication, depression, anxiety, psychosis, delirium, and dementia.   The need for increased amounts of alcohol to achieve the same desired effect, or a decreased effect from the consumption of the same amount of alcohol (tolerance).  Withdrawal symptoms upon reducing or stopping alcohol use, or alcohol use to reduce or avoid withdrawal symptoms. Withdrawal symptoms include:  Racing  heart.  Hand tremor.  Difficulty sleeping.  Nausea.  Vomiting.  Hallucinations.  Restlessness.  Seizures. DIAGNOSIS Alcohol use disorder is diagnosed through an assessment by your health care provider. Your health care provider may start by asking three or four questions to screen for excessive or problematic alcohol use. To confirm a diagnosis of alcohol use disorder, at least two symptoms must be present within a 53-month period. The severity of alcohol use disorder depends on the number of symptoms:  Mild--two or three.  Moderate--four or five.  Severe--six or more. Your health care provider may perform a physical exam or use results from lab tests to see if  you have physical problems resulting from alcohol use. Your health care provider may refer you to a mental health professional for evaluation. TREATMENT  Some people with alcohol use disorder are able to reduce their alcohol use to low-risk levels. Some people with alcohol use disorder need to quit drinking alcohol. When necessary, mental health professionals with specialized training in substance use treatment can help. Your health care provider can help you decide how severe your alcohol use disorder is and what type of treatment you need. The following forms of treatment are available:   Detoxification. Detoxification involves the use of prescription medicines to prevent alcohol withdrawal symptoms in the first week after quitting. This is important for people with a history of symptoms of withdrawal and for heavy drinkers who are likely to have withdrawal symptoms. Alcohol withdrawal can be dangerous and, in severe cases, cause death. Detoxification is usually provided in a hospital or in-patient substance use treatment facility.  Counseling or talk therapy. Talk therapy is provided by substance use treatment counselors. It addresses the reasons people use alcohol and ways to keep them from drinking again. The goals of talk therapy are to help people with alcohol use disorder find healthy activities and ways to cope with life stress, to identify and avoid triggers for alcohol use, and to handle cravings, which can cause relapse.  Medicines.Different medicines can help treat alcohol use disorder through the following actions:  Decrease alcohol cravings.  Decrease the positive reward response felt from alcohol use.  Produce an uncomfortable physical reaction when alcohol is used (aversion therapy).  Support groups. Support groups are run by people who have quit drinking. They provide emotional support, advice, and guidance. These forms of treatment are often combined. Some people with alcohol use  disorder benefit from intensive combination treatment provided by specialized substance use treatment centers. Both inpatient and outpatient treatment programs are available. Document Released: 10/17/2004 Document Revised: 01/24/2014 Document Reviewed: 12/17/2012 [facility] Patient Information 2015 [place], [place]. This information is not intended to replace advice given to you by your health care provider. Make sure you discuss any questions you have with your health care provider.

## 2015-01-18 ENCOUNTER — Encounter (HOSPITAL_COMMUNITY): Payer: Self-pay | Admitting: Emergency Medicine

## 2015-01-18 ENCOUNTER — Emergency Department (HOSPITAL_COMMUNITY)
Admission: EM | Admit: 2015-01-18 | Discharge: 2015-01-18 | Disposition: A | Discharge status: Home / Self Care | Payer: Medicare Other | Attending: Emergency Medicine | Admitting: Emergency Medicine

## 2015-01-18 DIAGNOSIS — M1990 Unspecified osteoarthritis, unspecified site: Secondary | ICD-10-CM | POA: Insufficient documentation

## 2015-01-18 DIAGNOSIS — Z792 Long term (current) use of antibiotics: Secondary | ICD-10-CM | POA: Diagnosis not present

## 2015-01-18 DIAGNOSIS — S301XXA Contusion of abdominal wall, initial encounter: Secondary | ICD-10-CM | POA: Insufficient documentation

## 2015-01-18 DIAGNOSIS — Y939 Activity, unspecified: Secondary | ICD-10-CM | POA: Diagnosis not present

## 2015-01-18 DIAGNOSIS — Z7982 Long term (current) use of aspirin: Secondary | ICD-10-CM | POA: Diagnosis not present

## 2015-01-18 DIAGNOSIS — Y92002 Bathroom of unspecified non-institutional (private) residence single-family (private) house as the place of occurrence of the external cause: Secondary | ICD-10-CM | POA: Diagnosis not present

## 2015-01-18 DIAGNOSIS — Z720 Tobacco use: Secondary | ICD-10-CM | POA: Insufficient documentation

## 2015-01-18 DIAGNOSIS — Z862 Personal history of diseases of the blood and blood-forming organs and certain disorders involving the immune mechanism: Secondary | ICD-10-CM | POA: Insufficient documentation

## 2015-01-18 DIAGNOSIS — F10920 Alcohol use, unspecified with intoxication, uncomplicated: Secondary | ICD-10-CM

## 2015-01-18 DIAGNOSIS — F419 Anxiety disorder, unspecified: Secondary | ICD-10-CM | POA: Diagnosis not present

## 2015-01-18 DIAGNOSIS — Z8669 Personal history of other diseases of the nervous system and sense organs: Secondary | ICD-10-CM | POA: Diagnosis not present

## 2015-01-18 DIAGNOSIS — Z8719 Personal history of other diseases of the digestive system: Secondary | ICD-10-CM | POA: Diagnosis not present

## 2015-01-18 DIAGNOSIS — I10 Essential (primary) hypertension: Secondary | ICD-10-CM | POA: Insufficient documentation

## 2015-01-18 DIAGNOSIS — X58XXXA Exposure to other specified factors, initial encounter: Secondary | ICD-10-CM | POA: Diagnosis not present

## 2015-01-18 DIAGNOSIS — Y999 Unspecified external cause status: Secondary | ICD-10-CM | POA: Insufficient documentation

## 2015-01-18 DIAGNOSIS — Z79899 Other long term (current) drug therapy: Secondary | ICD-10-CM | POA: Insufficient documentation

## 2015-01-18 DIAGNOSIS — I4891 Unspecified atrial fibrillation: Secondary | ICD-10-CM | POA: Insufficient documentation

## 2015-01-18 DIAGNOSIS — F10120 Alcohol abuse with intoxication, uncomplicated: Secondary | ICD-10-CM | POA: Diagnosis present

## 2015-01-18 DIAGNOSIS — Z7951 Long term (current) use of inhaled steroids: Secondary | ICD-10-CM | POA: Diagnosis not present

## 2015-01-18 DIAGNOSIS — Z8639 Personal history of other endocrine, nutritional and metabolic disease: Secondary | ICD-10-CM | POA: Insufficient documentation

## 2015-01-18 LAB — I-STAT TROPONIN, ED: TROPONIN I, POC: 0 ng/mL (ref 0.00–0.08)

## 2015-01-18 LAB — CBC
HCT: 46.6 % (ref 39.0–52.0)
Hemoglobin: 15.2 g/dL (ref 13.0–17.0)
MCH: 30.2 pg (ref 26.0–34.0)
MCHC: 32.6 g/dL (ref 30.0–36.0)
MCV: 92.5 fL (ref 78.0–100.0)
PLATELETS: 164 10*3/uL (ref 150–400)
RBC: 5.04 MIL/uL (ref 4.22–5.81)
RDW: 14.4 % (ref 11.5–15.5)
WBC: 9.3 10*3/uL (ref 4.0–10.5)

## 2015-01-18 LAB — COMPREHENSIVE METABOLIC PANEL
ALT: 12 U/L (ref 0–53)
AST: 16 U/L (ref 0–37)
Albumin: 3.6 g/dL (ref 3.5–5.2)
Alkaline Phosphatase: 60 U/L (ref 39–117)
Anion gap: 10 (ref 5–15)
BUN: 10 mg/dL (ref 6–23)
CALCIUM: 8.5 mg/dL (ref 8.4–10.5)
CO2: 23 mmol/L (ref 19–32)
CREATININE: 1.19 mg/dL (ref 0.50–1.35)
Chloride: 107 mmol/L (ref 96–112)
GFR calc Af Amer: 81 mL/min — ABNORMAL LOW (ref >90)
GFR calc non Af Amer: 70 mL/min — ABNORMAL LOW (ref >90)
Glucose, Bld: 107 mg/dL — ABNORMAL HIGH (ref 70–99)
Potassium: 3.6 mmol/L (ref 3.5–5.1)
SODIUM: 140 mmol/L (ref 135–145)
TOTAL PROTEIN: 6.2 g/dL (ref 6.0–8.3)
Total Bilirubin: 0.7 mg/dL (ref 0.3–1.2)

## 2015-01-18 LAB — ETHANOL: Alcohol, Ethyl (B): 220 mg/dL — ABNORMAL HIGH (ref 0–9)

## 2015-01-18 LAB — ACETAMINOPHEN LEVEL

## 2015-01-18 LAB — SALICYLATE LEVEL: Salicylate Lvl: 4.1 mg/dL (ref 2.8–20.0)

## 2015-01-18 NOTE — ED Notes (Signed)
Patient is alert and orientedx4.  Patient was explained discharge instructions and they understood them with no questions.  The patient's friend, Apolonio Schneiders is taking him home.

## 2015-01-18 NOTE — ED Notes (Signed)
Per [facility], pt hx of etoh, roommate found pt passed out on bathroom floor, unsure of how he got there. Pt was able to walk to bahtroom to the couch. Pt denies Heart hx. EMS showed Afib. Pt denies being on blood thinners. Hx of brain tumors and seizures. Also hx of drug abuse. Pt is AAOX4, in NAD.
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
## 2015-01-18 NOTE — Discharge Instructions (Signed)
Alcohol Intoxication  Alcohol intoxication occurs when the amount of alcohol that a person has consumed impairs his or her ability to mentally and physically function. Alcohol directly impairs the normal chemical activity of the brain. Drinking large amounts of alcohol can lead to changes in mental function and behavior, and it can cause many physical effects that can be harmful.   Alcohol intoxication can range in severity from mild to very severe. Various factors can affect the level of intoxication that occurs, such as the person's age, gender, weight, frequency of alcohol consumption, and the presence of other medical conditions (such as diabetes, seizures, or heart conditions). Dangerous levels of alcohol intoxication may occur when people drink large amounts of alcohol in a short period (binge drinking). Alcohol can also be especially dangerous when combined with certain prescription medicines or "recreational" drugs.  SIGNS AND SYMPTOMS  Some common signs and symptoms of mild alcohol intoxication include:  · Loss of coordination.  · Changes in mood and behavior.  · Impaired judgment.  · Slurred speech.  As alcohol intoxication progresses to more severe levels, other signs and symptoms will appear. These may include:  · Vomiting.  · Confusion and impaired memory.  · Slowed breathing.  · Seizures.  · Loss of consciousness.  DIAGNOSIS   Your health care provider will take a medical history and perform a physical exam. You will be asked about the amount and type of alcohol you have consumed. Blood tests will be done to measure the concentration of alcohol in your blood. In many places, your blood alcohol level must be lower than 80 mg/dL (0.08%) to legally drive. However, many dangerous effects of alcohol can occur at much lower levels.   TREATMENT   People with alcohol intoxication often do not require treatment. Most of the effects of alcohol intoxication are temporary, and they go away as the alcohol naturally  leaves the body. Your health care provider will monitor your condition until you are stable enough to go home. Fluids are sometimes given through an IV access tube to help prevent dehydration.   HOME CARE INSTRUCTIONS  · Do not drive after drinking alcohol.  · Stay hydrated. Drink enough water and fluids to keep your urine clear or pale yellow. Avoid caffeine.    · Only take over-the-counter or prescription medicines as directed by your health care provider.    SEEK MEDICAL CARE IF:   · You have persistent vomiting.    · You do not feel better after a few days.  · You have frequent alcohol intoxication. Your health care provider can help determine if you should see a substance use treatment counselor.  SEEK IMMEDIATE MEDICAL CARE IF:   · You become shaky or tremble when you try to stop drinking.    · You shake uncontrollably (seizure).    · You throw up (vomit) blood. This may be bright red or may look like black coffee grounds.    · You have blood in your stool. This may be bright red or may appear as a black, tarry, bad smelling stool.    · You become lightheaded or faint.    MAKE SURE YOU:   · Understand these instructions.  · Will watch your condition.  · Will get help right away if you are not doing well or get worse.  Document Released: 06/19/2005 Document Revised: 05/12/2013 Document Reviewed: 02/12/2013  [employer]® Patient Information ©2015 [employer]. This information is not intended to replace advice given to you by your health care provider. Make sure   you discuss any questions you have with your health care provider.

## 2015-01-20 ENCOUNTER — Ambulatory Visit: Payer: Medicare Other | Admitting: Neurology

## 2015-02-28 ENCOUNTER — Other Ambulatory Visit (INDEPENDENT_AMBULATORY_CARE_PROVIDER_SITE_OTHER): Payer: Medicare Other

## 2015-02-28 ENCOUNTER — Encounter: Payer: Self-pay | Admitting: Neurology

## 2015-02-28 ENCOUNTER — Ambulatory Visit (INDEPENDENT_AMBULATORY_CARE_PROVIDER_SITE_OTHER): Payer: Medicare Other | Admitting: Neurology

## 2015-02-28 VITALS — BP 120/70 | HR 106 | Ht 72.0 in | Wt 199.7 lb

## 2015-02-28 DIAGNOSIS — R4189 Other symptoms and signs involving cognitive functions and awareness: Secondary | ICD-10-CM

## 2015-02-28 DIAGNOSIS — Z720 Tobacco use: Secondary | ICD-10-CM

## 2015-02-28 DIAGNOSIS — G0490 Encephalitis and encephalomyelitis, unspecified: Secondary | ICD-10-CM | POA: Diagnosis not present

## 2015-02-28 DIAGNOSIS — G40209 Localization-related (focal) (partial) symptomatic epilepsy and epileptic syndromes with complex partial seizures, not intractable, without status epilepticus: Secondary | ICD-10-CM | POA: Diagnosis not present

## 2015-02-28 DIAGNOSIS — G4440 Drug-induced headache, not elsewhere classified, not intractable: Secondary | ICD-10-CM

## 2015-02-28 DIAGNOSIS — G4441 Drug-induced headache, not elsewhere classified, intractable: Secondary | ICD-10-CM

## 2015-02-28 LAB — COMPREHENSIVE METABOLIC PANEL
ALT: 9 U/L (ref 0–53)
AST: 10 U/L (ref 0–37)
Albumin: 4.3 g/dL (ref 3.5–5.2)
Alkaline Phosphatase: 68 U/L (ref 39–117)
BUN: 13 mg/dL (ref 6–23)
CHLORIDE: 111 meq/L (ref 96–112)
CO2: 20 mEq/L (ref 19–32)
Calcium: 9.3 mg/dL (ref 8.4–10.5)
Creatinine, Ser: 1 mg/dL (ref 0.40–1.50)
GFR: 83.96 mL/min (ref >60.00)
Glucose, Bld: 108 mg/dL — ABNORMAL HIGH (ref 70–99)
Potassium: 3.8 mEq/L (ref 3.5–5.1)
Sodium: 138 mEq/L (ref 135–145)
Total Bilirubin: 0.4 mg/dL (ref 0.2–1.2)
Total Protein: 7 g/dL (ref 6.0–8.3)

## 2015-02-28 LAB — CBC WITH DIFFERENTIAL/PLATELET
Basophils Absolute: 0.1 10*3/uL (ref 0.0–0.1)
Basophils Relative: 0.6 % (ref 0.0–3.0)
Eosinophils Absolute: 0.1 10*3/uL (ref 0.0–0.7)
Eosinophils Relative: 1.1 % (ref 0.0–5.0)
HCT: 50.3 % (ref 39.0–52.0)
Hemoglobin: 16.5 g/dL (ref 13.0–17.0)
LYMPHS ABS: 1.7 10*3/uL (ref 0.7–4.0)
Lymphocytes Relative: 14.9 % (ref 12.0–46.0)
MCHC: 32.8 g/dL (ref 30.0–36.0)
MCV: 92.3 fl (ref 78.0–100.0)
MONO ABS: 0.3 10*3/uL (ref 0.1–1.0)
Monocytes Relative: 3.1 % (ref 3.0–12.0)
NEUTROS PCT: 80.3 % — AB (ref 43.0–77.0)
Neutro Abs: 9 10*3/uL — ABNORMAL HIGH (ref 1.4–7.7)
Platelets: 227 10*3/uL (ref 150.0–400.0)
RBC: 5.45 Mil/uL (ref 4.22–5.81)
RDW: 16.2 % — AB (ref 11.5–15.5)
WBC: 11.3 10*3/uL — ABNORMAL HIGH (ref 4.0–10.5)

## 2015-02-28 NOTE — Patient Instructions (Signed)
1.  Continue divalproex ER 500mg  daily at bedtime, topiramate 100mg  twice daily and Vimpat 100mg  twice daily 2.  Check CBC with diff and CMP 3.  Stop oxycodone, ibuprofen, and BCs.  Limit use of ibuprofen or may take Excedrin to no more than 2 days out of the week.  So 5 days out of the week, don't take anything for pain.  Otherwise, it will cause headache. 4.  Follow up in 3 months.
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
## 2015-03-01 ENCOUNTER — Telehealth: Payer: Self-pay | Admitting: Neurology

## 2015-03-01 NOTE — Telephone Encounter (Signed)
FYI........ patient will have to come back in for these labs

## 2015-03-01 NOTE — Telephone Encounter (Signed)
[facility] called. Labs were drawn on this patient downstairs, [facility] in unable to process the specimen sent on this patient due to a drawing error. Specimen was drawn with incorrect tubes and lab can not be drawn with that tube. / Sherri S.

## 2015-03-20 ENCOUNTER — Telehealth: Payer: Self-pay | Admitting: *Deleted

## 2015-03-20 ENCOUNTER — Other Ambulatory Visit: Payer: Self-pay | Admitting: *Deleted

## 2015-03-20 DIAGNOSIS — R569 Unspecified convulsions: Secondary | ICD-10-CM

## 2015-03-20 NOTE — Telephone Encounter (Signed)
I spoke with patient he is aware that  A error was made when drawing his blood  A new ordered was put into system and he will come by office let the front desk sign him in and go to second floor for labs to be completed . Slip given to front desk and Tish Frederickson is aware .

## 2015-03-24 ENCOUNTER — Other Ambulatory Visit: Payer: Medicare Other

## 2015-03-28 ENCOUNTER — Other Ambulatory Visit: Payer: Self-pay | Admitting: Neurology

## 2015-03-28 NOTE — Telephone Encounter (Signed)
Patient was dismissed from our practice on 11/10/2014

## 2015-03-30 ENCOUNTER — Other Ambulatory Visit: Payer: Self-pay | Admitting: Neurology

## 2015-03-30 NOTE — Telephone Encounter (Signed)
Patient was dismissed from our practice on 11/10/2014

## 2015-04-07 ENCOUNTER — Telehealth: Payer: Self-pay | Admitting: Neurology

## 2015-04-07 NOTE — Telephone Encounter (Signed)
Patient called back and said that he came in to do blood work but it was a 2 1/2 hour wait.  He said that his ride could not wait that long.  He said that Dr. Tomi Likens was going to start him on another medication but does not know the name.

## 2015-04-07 NOTE — Telephone Encounter (Signed)
Harold Bailey, patient needs to clarify which medications and at what dose, he takes his medications.  I know he takes Topamax, Vimpat and Depakote.

## 2015-04-07 NOTE — Telephone Encounter (Signed)
Pt Harold Bailey. Alarid, PH# [phone] called to request a Seizure medication/pt didn't know the name of med.(he use to get through Dr. Rockwell Alexandria he no longer sees her/ can he get Dr. Tomi Likens to refill/prescribe this med?

## 2015-04-09 ENCOUNTER — Other Ambulatory Visit: Payer: Self-pay | Admitting: Neurology

## 2015-04-10 ENCOUNTER — Other Ambulatory Visit: Payer: Self-pay | Admitting: *Deleted

## 2015-04-10 NOTE — Telephone Encounter (Signed)
I spoke with this patient he will come in on 04/13/15 to our office and check in for labs .

## 2015-04-10 NOTE — Telephone Encounter (Signed)
I can't do anything unless we get all of his correct medications and dosing, as well as the labs performed as requested.

## 2015-04-13 ENCOUNTER — Other Ambulatory Visit: Payer: Medicare Other

## 2015-04-13 DIAGNOSIS — G40109 Localization-related (focal) (partial) symptomatic epilepsy and epileptic syndromes with simple partial seizures, not intractable, without status epilepticus: Secondary | ICD-10-CM

## 2015-04-13 DIAGNOSIS — R569 Unspecified convulsions: Secondary | ICD-10-CM

## 2015-04-19 ENCOUNTER — Telehealth: Payer: Self-pay | Admitting: *Deleted

## 2015-04-19 NOTE — Telephone Encounter (Signed)
He has not been taking his medication as he has picked it up . He states he has so much medication he was not sure what he was supposed to be taking . He did not want Home Health . I have ordered all his seizure medication for him  And the pharmacy will help him set it up in his pill box  Which is for a month . I spoke with Leroy Sea at the pharmacy

## 2015-04-24 ENCOUNTER — Telehealth: Payer: Self-pay | Admitting: *Deleted

## 2015-04-24 NOTE — Telephone Encounter (Signed)
I have left message for patient to call me back so I can schedule his labs

## 2015-04-24 NOTE — Telephone Encounter (Signed)
-----   Message from Pieter Partridge, DO sent at 04/18/2015 11:57 AM EDT ----- Received labs of his medication levels.  All his anti-epileptic medications are either very low or undetectable.  Topiramate level is 1.1, valproic acid level undetectable, and Vimpat level 2.    As far as I know, he is supposed to be taking Topiramate 100mg  twice daily, Vimpat 100mg  twice daily and Depakote ER 500mg  daily.  Is there anyway to ensure that he is taking his medication correctly.  Maybe home nurse can come over.  After we know he has been taking his medication as prescribed, I would recheck all 3 levels in a week.

## 2015-04-25 ENCOUNTER — Other Ambulatory Visit: Payer: Self-pay | Admitting: *Deleted

## 2015-04-25 ENCOUNTER — Other Ambulatory Visit: Payer: Medicare Other

## 2015-04-25 DIAGNOSIS — R561 Post traumatic seizures: Secondary | ICD-10-CM

## 2015-04-27 ENCOUNTER — Other Ambulatory Visit: Payer: Medicare Other

## 2015-05-15 ENCOUNTER — Telehealth: Payer: Self-pay | Admitting: Neurology

## 2015-05-15 NOTE — Telephone Encounter (Signed)
Pt called and wanted to know if his blood work was ready and had a question about his medication/Dawn CB# [phone]

## 2015-05-16 NOTE — Telephone Encounter (Signed)
Patient is aware of lab results he is c/o of a rash I advised him to contact his primary care for this issue

## 2015-05-17 ENCOUNTER — Ambulatory Visit (INDEPENDENT_AMBULATORY_CARE_PROVIDER_SITE_OTHER): Payer: Medicare Other | Admitting: Podiatry

## 2015-05-17 ENCOUNTER — Encounter: Payer: Self-pay | Admitting: Podiatry

## 2015-05-17 VITALS — BP 123/70 | HR 103 | Temp 97.3°F | Resp 14

## 2015-05-17 DIAGNOSIS — M129 Arthropathy, unspecified: Secondary | ICD-10-CM

## 2015-05-17 DIAGNOSIS — R269 Unspecified abnormalities of gait and mobility: Secondary | ICD-10-CM

## 2015-05-17 DIAGNOSIS — M19079 Primary osteoarthritis, unspecified ankle and foot: Secondary | ICD-10-CM

## 2015-05-17 NOTE — Progress Notes (Signed)
   Subjective:    Patient ID: Harold Bailey, male    DOB: [birth year], 49 y.o.   MRN: [id number]  HPI   This patient presents today complaining of pain on the medial border left hallux  when walking wearing shoes for the past year and a half. He said no treatment or self treatment for this complaint. His orthopedic doctor made aware that the area was associated with his  is gait disturbance. 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 There was a history of ankle fracture which required surgical reduction in 2014, left At this time patient states that his left ankle and  rear foot are extremely painful walking. He says the pain in this area does reduce the amount of standing and walking that he can do. Patient has tried more supportive shoes without any relief of symptoms. He denies having any brace for the left ankle pain. Describes wearing a boot for the surgical procedure, however as no recent supportive bracing   Review of Systems  Constitutional: Positive for appetite change.  HENT:       Ringing in ears  Cardiovascular:       Pain in calf when walking  Musculoskeletal:       Joint pain  Skin: Positive for rash.       Objective:   Physical Exam  Patient appears responsive able to respond to questioning  Vascular: No peripheral edema noted bilaterally DP right 0/4 DP left 2/4 PT pulses 2/4 bilaterally Capillary reflex immediate bilaterally  Neurological: Ankle reflexes nonreactive left and weak reactive right Sensation to 10 g monofilament wire intact 5/5 bilaterally  Dermatological: Surgical scars medial anterior and lateral left ankle Surgical scars right ankle Keratoses medial left hallux  Musculoskeletal: Left ankle appears fixed and rigid at 90 with complaint of tenderness when the forefoot is dorsiflexed Subtalar joint left  limited  Right subtalar joint appears to have adequate range of motion Right ankle joint as adequate range of motion  When patient walks he has a limping gait favoring the left foot      Assessment & Plan:   Assessment: Gait disturbance left associated with left ankle pain Keratoses left hallux associated with gait disturbance  Plan: During patient's visit the entire EPIC was unavailable. No x-rays were taken because of inability to request x-ray in our office at this time I reviewed the results of patient's examination today and I made aware that the chronic gait disturbance is most likely associated with his left ankle arthropathy. I recommended an [place] type brace for his left lower leg and ankle as a possible treatment to reduce left ankle pain. Patient was very interested in this option.  Reschedule visit for patient for evaluation for [place] brace. Also, I would like to have it x-ray of left ankle at future visit

## 2015-05-18 NOTE — Patient Instructions (Signed)
Today we discussed the possibility of a custom fitted brace for your left lower leg and ankle area as a possible treatment for your left ankle pain. We will schedule a visit with you to have evaluation for an [place] brace. Please call the office to confirm this appointment. Our computer system was unavailable during your visit

## 2015-05-24 ENCOUNTER — Telehealth: Payer: Self-pay | Admitting: *Deleted

## 2015-05-24 ENCOUNTER — Telehealth: Payer: Self-pay | Admitting: Neurology

## 2015-05-24 NOTE — Telephone Encounter (Signed)
I spoke with Mr Harold Bailey  He states he had a seizure  Yesterday  It lasted about  5 minutes  He then was back to his normal state within 3 minutes He was advised to keep his follow up appt  On 06/06/15

## 2015-05-24 NOTE — Telephone Encounter (Signed)
I tried to contact patient  His phone rang several times then was d/c

## 2015-05-24 NOTE — Telephone Encounter (Signed)
Pt called and stated he had a seizure yesterday and has questions about the medication he is taking for his seizures/Dawn CB# [phone]

## 2015-05-31 ENCOUNTER — Ambulatory Visit: Payer: Medicare Other | Admitting: *Deleted

## 2015-05-31 DIAGNOSIS — M19079 Primary osteoarthritis, unspecified ankle and foot: Secondary | ICD-10-CM

## 2015-05-31 NOTE — Progress Notes (Signed)
Patient ID: Harold Bailey, male   DOB: 10/26/1965, 49 y.o.   MRN: [id number] Patient presents to be casted for a Brace with Benjie Karvonen

## 2015-06-06 ENCOUNTER — Encounter: Payer: Self-pay | Admitting: Neurology

## 2015-06-06 ENCOUNTER — Ambulatory Visit (INDEPENDENT_AMBULATORY_CARE_PROVIDER_SITE_OTHER): Payer: Medicare Other | Admitting: Neurology

## 2015-06-06 VITALS — BP 122/82 | HR 105 | Temp 97.9°F | Resp 16 | Ht 72.0 in | Wt 219.2 lb

## 2015-06-06 DIAGNOSIS — G4441 Drug-induced headache, not elsewhere classified, intractable: Secondary | ICD-10-CM | POA: Diagnosis not present

## 2015-06-06 DIAGNOSIS — G0490 Encephalitis and encephalomyelitis, unspecified: Secondary | ICD-10-CM

## 2015-06-06 DIAGNOSIS — R4189 Other symptoms and signs involving cognitive functions and awareness: Secondary | ICD-10-CM | POA: Diagnosis not present

## 2015-06-06 DIAGNOSIS — G4440 Drug-induced headache, not elsewhere classified, not intractable: Secondary | ICD-10-CM

## 2015-06-06 DIAGNOSIS — G40209 Localization-related (focal) (partial) symptomatic epilepsy and epileptic syndromes with complex partial seizures, not intractable, without status epilepticus: Secondary | ICD-10-CM | POA: Diagnosis not present

## 2015-06-06 DIAGNOSIS — F17200 Nicotine dependence, unspecified, uncomplicated: Secondary | ICD-10-CM | POA: Insufficient documentation
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
## 2015-06-06 NOTE — Patient Instructions (Signed)
1.  You must take the following medications as prescribed:  Depakote ER (divalproex) 500mg  daily  Lacosamide 100mg  twice daily (once in morning and once at night)  Topiramate 100mg  twice daily (once in morning and once at night) IN ONE WEEK, I WANT TO RECHECK LEVELS OF ALL THREE MEDICATIONS 2.  Limit use of BC powder, Advil, Aleve, or tylenol to no more than 2 days out of the week (so 5 days out of the week, you should not take any pain relievers).  This is to try and actually reduce number of headaches. 3.  Follow up in 3 months.

## 2015-06-07 ENCOUNTER — Telehealth: Payer: Self-pay | Admitting: *Deleted

## 2015-06-07 MED ORDER — TOPIRAMATE 100 MG PO TABS: 100.0000 mg | ORAL_TABLET | Freq: Two times a day (BID) | ORAL | Status: DC

## 2015-06-07 MED ORDER — LACOSAMIDE 100 MG PO TABS: 100.0000 mg | ORAL_TABLET | Freq: Two times a day (BID) | ORAL | Status: DC

## 2015-06-07 MED ORDER — DIVALPROEX SODIUM ER 500 MG PO TB24: 500.0000 mg | ORAL_TABLET | Freq: Every day | ORAL | Status: DC

## 2015-06-07 NOTE — Telephone Encounter (Signed)
Spoke with patient. Patient stated that Rx's from yesterday were not called/sent in. I have sent them in. Patient advised and will CB with any questions or concerns. [facility]

## 2015-06-07 NOTE — Telephone Encounter (Signed)
Patient calling to check on the status of his medication refills from yesterday and he also forgot where he needs to go next week please advise  Call back number [phone]

## 2015-07-04 ENCOUNTER — Telehealth: Payer: Self-pay | Admitting: Neurology

## 2015-07-04 ENCOUNTER — Other Ambulatory Visit: Payer: Self-pay | Admitting: *Deleted

## 2015-07-04 DIAGNOSIS — Z79899 Other long term (current) drug therapy: Secondary | ICD-10-CM

## 2015-07-04 DIAGNOSIS — G4440 Drug-induced headache, not elsewhere classified, not intractable: Secondary | ICD-10-CM

## 2015-07-04 NOTE — Telephone Encounter (Signed)
Patient will come in on Monday

## 2015-07-04 NOTE — Telephone Encounter (Signed)
Then we need to check levels of his medications:  Valproic acid, lacosamide and topirmate.

## 2015-07-04 NOTE — Telephone Encounter (Signed)
PT called and said he is staggering and wondered if it was his medication was causing it/Dawn CB# [phone]

## 2015-07-04 NOTE — Telephone Encounter (Signed)
Called patient back and he said that he is unbalanced and thinks it may be caused by one of his meds.  Please advise.

## 2015-07-05 ENCOUNTER — Ambulatory Visit: Payer: Medicare Other | Admitting: *Deleted

## 2015-07-05 ENCOUNTER — Encounter: Payer: Self-pay | Admitting: Podiatry

## 2015-07-05 DIAGNOSIS — M129 Arthropathy, unspecified: Secondary | ICD-10-CM | POA: Diagnosis not present

## 2015-07-05 DIAGNOSIS — M19079 Primary osteoarthritis, unspecified ankle and foot: Secondary | ICD-10-CM

## 2015-07-05 DIAGNOSIS — R269 Unspecified abnormalities of gait and mobility: Secondary | ICD-10-CM

## 2015-07-05 NOTE — Progress Notes (Signed)
Patient ID: Harold Bailey, male   DOB: 02-Feb-1966, 49 y.o.   MRN: [id number] Patient presents for brace fitting with [facility]

## 2015-07-24 ENCOUNTER — Telehealth: Payer: Self-pay | Admitting: Neurology

## 2015-07-24 ENCOUNTER — Other Ambulatory Visit: Payer: Self-pay | Admitting: Neurology

## 2015-07-24 NOTE — Telephone Encounter (Signed)
Pt needs to talk with someone about his blood work please call [phone]

## 2015-07-24 NOTE — Telephone Encounter (Signed)
Spoke with patient. Had blood work done today. Will call him back when results come in.

## 2015-07-25 LAB — VALPROIC ACID LEVEL: VALPROIC ACID LVL: 19 ug/mL — AB (ref 50.0–100.0)

## 2015-07-25 LAB — TOPIRAMATE LEVEL: TOPIRAMATE LVL: 6.1 ug/mL

## 2015-07-27 ENCOUNTER — Other Ambulatory Visit: Payer: Self-pay | Admitting: Internal Medicine

## 2015-07-28 LAB — LACOSAMIDE, SERUM/PLASMA: Lacosamide, Serum/Plasma: 2.8 ug/mL

## 2015-08-15 ENCOUNTER — Telehealth: Payer: Self-pay | Admitting: Neurology

## 2015-08-15 MED ORDER — DIVALPROEX SODIUM ER 250 MG PO TB24: 250.0000 mg | ORAL_TABLET | Freq: Every day | ORAL | Status: DC

## 2015-08-15 NOTE — Telephone Encounter (Signed)
Pt states he just started taking the Topamax regularly since his last visit, he had previously been taking it sporadically. He has been taking it BID, takes first dose when we wakes, and takes the second dose around 4 p.m. He has been having these symptoms for 3 weeks. He is still  Having regular bowel movements (x3/day), just feels very bloated, and does not have an appetite.

## 2015-08-15 NOTE — Telephone Encounter (Signed)
PT called and wanted to know his blood work and has questions about his medication/Dawn CB# [phone]

## 2015-08-15 NOTE — Telephone Encounter (Signed)
I don't think it is the topamax.  He has been on this medication for quite some time and it is unlikely to start causing these side effects now.

## 2015-08-15 NOTE — Telephone Encounter (Signed)
Pt returned call about med(s)/call back @ [phone]

## 2015-08-15 NOTE — Telephone Encounter (Signed)
1.  Just starting the dose at 100mg  twice daily may be too much.  He should slowly titrate on it. We will start topiramate (Topamax) 50mg  tablets.  We will increase the dose as follows to goal of 100mg  twice daily:      Morning Evening Week 1:   0.5 tab  0.5 tab Week 2:    1 tab  1 tab Week 3:   1.5 tabs 1.5 tabs Week 4 and thereafter 2 tabs  2 tabs.  2.  The valproic acid level is a little low.  I would like to increase Depakote ER from 500mg  to 750mg  daily (we may need to prescribe him to take three 250mg  pills daily)

## 2015-08-15 NOTE — Telephone Encounter (Signed)
Message relayed to patient. Verbalized understanding and denied questions. Pt will try to titrate medication up to see if that helps with symptoms. Will call us back in a couple weeks. Depakote sent in.

## 2015-08-15 NOTE — Telephone Encounter (Signed)
Spoke with patient. Would like lab results. Called [facility] to fax results over. Pt also concerned about medication Topamax. States medication is causing him to be bloated. He is having to force himself to eat. He has no appetite. Please advise. (Pt assumes it's a side effect of Topamax, due to Pharmacist recommendation.)

## 2015-08-16 ENCOUNTER — Ambulatory Visit: Payer: Medicare Other | Admitting: Podiatry

## 2015-08-22 ENCOUNTER — Telehealth: Payer: Self-pay

## 2015-08-22 DIAGNOSIS — Z79899 Other long term (current) drug therapy: Secondary | ICD-10-CM

## 2015-08-22 NOTE — Telephone Encounter (Signed)
-----   Message from Pieter Partridge, DO sent at 08/22/2015  9:52 AM EST ----- The medication levels are still low despite strong doses.  It's not that we don't trust him, but for the sake of safety and to help me determine what to do, I would like to get a Home Health Assessment to evaluate and recommend measures for appropriately taking his medication.

## 2015-08-30 ENCOUNTER — Ambulatory Visit: Payer: Medicare Other | Admitting: Podiatry

## 2015-09-01 ENCOUNTER — Other Ambulatory Visit: Payer: Self-pay | Admitting: Internal Medicine

## 2015-09-02 ENCOUNTER — Other Ambulatory Visit: Payer: Self-pay | Admitting: Internal Medicine

## 2015-09-07 ENCOUNTER — Telehealth: Payer: Self-pay | Admitting: Neurology

## 2015-09-07 NOTE — Telephone Encounter (Signed)
Ok to write? Please advise.

## 2015-09-07 NOTE — Telephone Encounter (Signed)
Pt needs a letter for jury duty to get out of it please call him at [phone]

## 2015-09-08 NOTE — Telephone Encounter (Signed)
Letter written. Mailed to patient at his request.

## 2015-09-08 NOTE — Telephone Encounter (Signed)
Yes, due to cognitive impairment and epilepsy

## 2015-09-15 ENCOUNTER — Other Ambulatory Visit: Payer: Self-pay | Admitting: Internal Medicine

## 2015-09-22 ENCOUNTER — Other Ambulatory Visit: Payer: Self-pay | Admitting: Internal Medicine

## 2015-09-26 ENCOUNTER — Ambulatory Visit: Payer: Medicare Other | Admitting: Neurology

## 2015-09-27 ENCOUNTER — Ambulatory Visit: Payer: Medicare Other | Admitting: Podiatry

## 2015-11-27 ENCOUNTER — Telehealth: Payer: Self-pay | Admitting: Neurology

## 2015-11-27 MED ORDER — DIVALPROEX SODIUM ER 500 MG PO TB24: 500.0000 mg | ORAL_TABLET | Freq: Every day | ORAL | Status: DC

## 2015-11-27 NOTE — Telephone Encounter (Signed)
Spoke with patient and he states pharmacy only gave him Depakote 250 mg and did not give him 500 mg tablets that he was also taking in the morning. He wanted to know if this had been discontinued. Made him aware I see nothing in his notes that state to stop medication and we have not received a refill request from his pharmacy for Depakote 500 mg tablets. RX sent to pharmacy and made him aware to continue 500 mg in the morning, 250 mg in the afternoon and keep appt in April as scheduled. He will call with any other problems/questions.

## 2015-11-27 NOTE — Telephone Encounter (Signed)
Pt needs to talk to someone about his medication he wants to know if Dr Tomi Likens took him off some medication please call [phone]

## 2015-12-23 ENCOUNTER — Other Ambulatory Visit: Payer: Self-pay | Admitting: Neurology

## 2015-12-25 MED ORDER — DIVALPROEX SODIUM ER 500 MG PO TB24: 500.0000 mg | ORAL_TABLET | Freq: Every day | ORAL | Status: DC

## 2015-12-25 NOTE — Telephone Encounter (Signed)
Last OV: 06/06/15 Next OV: 01/01/16  Depakote ER 500mg  daily 08/15/15 - I would like to increase Depakote ER from 500mg  to 750mg  daily

## 2016-01-01 ENCOUNTER — Other Ambulatory Visit: Payer: Self-pay | Admitting: Neurology

## 2016-01-01 ENCOUNTER — Encounter: Payer: Self-pay | Admitting: Neurology

## 2016-01-01 ENCOUNTER — Ambulatory Visit (INDEPENDENT_AMBULATORY_CARE_PROVIDER_SITE_OTHER): Payer: Medicare Other | Admitting: Neurology

## 2016-01-01 VITALS — BP 126/74 | HR 103 | Ht 72.0 in | Wt 228.9 lb

## 2016-01-01 DIAGNOSIS — G5603 Carpal tunnel syndrome, bilateral upper limbs: Secondary | ICD-10-CM

## 2016-01-01 DIAGNOSIS — G40209 Localization-related (focal) (partial) symptomatic epilepsy and epileptic syndromes with complex partial seizures, not intractable, without status epilepticus: Secondary | ICD-10-CM

## 2016-01-01 DIAGNOSIS — G4441 Drug-induced headache, not elsewhere classified, intractable: Secondary | ICD-10-CM | POA: Diagnosis not present

## 2016-01-01 DIAGNOSIS — F17200 Nicotine dependence, unspecified, uncomplicated: Secondary | ICD-10-CM

## 2016-01-01 DIAGNOSIS — G0490 Encephalitis and encephalomyelitis, unspecified: Secondary | ICD-10-CM | POA: Diagnosis not present

## 2016-01-01 DIAGNOSIS — G4440 Drug-induced headache, not elsewhere classified, not intractable: Secondary | ICD-10-CM

## 2016-01-01 DIAGNOSIS — R4189 Other symptoms and signs involving cognitive functions and awareness: Secondary | ICD-10-CM

## 2016-01-01 MED ORDER — DIVALPROEX SODIUM ER 500 MG PO TB24: 1000.0000 mg | ORAL_TABLET | Freq: Every day | ORAL | Status: DC

## 2016-01-01 MED ORDER — WRIST SPLINT MISC: 2.0000 | Status: DC

## 2016-01-01 MED ORDER — TOPIRAMATE 25 MG PO TABS: ORAL_TABLET | ORAL | Status: DC
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
## 2016-01-01 NOTE — Patient Instructions (Addendum)
1.  We will increase Depakote ER to 1000mg  daily.  I prescribed 500mg  tablets.  Take two tablets daily (at once) 2.  We will slowly get off of the topamax.  I will prescribe you 25mg  tablets:  Take 3 tablets twice daily for 7 days,  Then 2 tablets twice daily for 7 days  Then 1 tablet twice daily for 7 days  Then STOP 3.  Continue lacosamide 100mg  twice daily 4.  Stop BCs.  Take ibuprofen up to 800mg  but limit to no more than 2 day out of the week 5.  The hand numbness may be carpal tunnel syndrome.  I will give you a prescription for wrist splints to wear at night and while laying down. 6.  Follow up in 5 months.

## 2016-01-01 NOTE — Progress Notes (Signed)
Chart forwarded.  

## 2016-01-02 ENCOUNTER — Other Ambulatory Visit: Payer: Self-pay | Admitting: Neurology

## 2016-01-02 NOTE — Telephone Encounter (Signed)
We will increase Depakote ER to 1000mg  daily

## 2016-01-31 ENCOUNTER — Telehealth: Payer: Self-pay

## 2016-01-31 DIAGNOSIS — R4189 Other symptoms and signs involving cognitive functions and awareness: Secondary | ICD-10-CM

## 2016-01-31 NOTE — Telephone Encounter (Signed)
Pt called to confirm his medications. States he had surgery this week and they were questioning whether or not he was taking medication appropriately. Pt very confused about medication. Pt states he threw his bottles out. Attempted to describe medications to me. Informed pt I was unsure what the medications looked like. Advised pt to call Pharmacy after we got off the phone for further help. We did go over medications that are being prescribed by Dr. Tomi Likens. Home health referral was placed for further help for patient.

## 2016-02-07 ENCOUNTER — Other Ambulatory Visit (HOSPITAL_COMMUNITY)
Admission: RE | Admit: 2016-02-07 | Discharge: 2016-02-07 | Disposition: A | Discharge status: Home / Self Care | Payer: Medicare Other | Source: Other Acute Inpatient Hospital | Attending: Neurology | Admitting: Neurology

## 2016-02-07 ENCOUNTER — Telehealth: Payer: Self-pay

## 2016-02-07 DIAGNOSIS — G4089 Other seizures: Secondary | ICD-10-CM | POA: Diagnosis present

## 2016-02-07 LAB — VALPROIC ACID LEVEL: Valproic Acid Lvl: 49 ug/mL — ABNORMAL LOW (ref 50.0–100.0)

## 2016-02-07 NOTE — Telephone Encounter (Signed)
That could be his baseline.  But he should contact his PCP to see if anything else needs to be checked.

## 2016-02-07 NOTE — Telephone Encounter (Signed)
Elmyra Ricks, home health nurse, from [place] called to report that at her visit w/ pt this morning pt's heart rate was 108. Based on recent visit, hr is fairly normal for him. Please advise.

## 2016-02-07 NOTE — Telephone Encounter (Signed)
Message relayed to patient. Verbalized understanding and denied questions.   

## 2016-02-08 ENCOUNTER — Other Ambulatory Visit (HOSPITAL_COMMUNITY): Payer: Self-pay | Admitting: Family

## 2016-02-25 ENCOUNTER — Other Ambulatory Visit: Payer: Self-pay | Admitting: Neurology

## 2016-02-26 ENCOUNTER — Other Ambulatory Visit: Payer: Self-pay | Admitting: Neurology

## 2016-02-26 NOTE — Telephone Encounter (Signed)
Last OV: 01/01/16 Next OV: 06/04/16 . Continue lacosamide 100mg  twice daily

## 2016-02-29 MED FILL — OXYCODONE/APAP 5-325: 5-325 | 30 days supply | Qty: 60 | Fill #0

## 2016-04-25 ENCOUNTER — Encounter: Payer: Self-pay | Admitting: *Deleted

## 2016-05-03 ENCOUNTER — Other Ambulatory Visit: Payer: Self-pay | Admitting: Neurology

## 2016-05-03 MED ORDER — DIVALPROEX SODIUM ER 500 MG PO TB24: 1000.0000 mg | ORAL_TABLET | Freq: Every day | ORAL | 0 refills | Status: DC

## 2016-05-03 NOTE — Telephone Encounter (Signed)
We will increase Depakote ER to 1000mg  daily - refill not appropriate as sent in. It was sent in for 250 mg tablets. Will send in correct RX for 1 month at the 500 mg tablets.  Pt f/u scheduled for 06/04/16

## 2016-06-04 ENCOUNTER — Ambulatory Visit: Payer: Medicare Other | Admitting: Neurology

## 2016-07-01 ENCOUNTER — Emergency Department (HOSPITAL_COMMUNITY): Payer: Medicare Other

## 2016-07-01 ENCOUNTER — Encounter (HOSPITAL_COMMUNITY): Payer: Self-pay | Admitting: Emergency Medicine

## 2016-07-01 ENCOUNTER — Inpatient Hospital Stay (HOSPITAL_COMMUNITY)
Admission: EM | Admit: 2016-07-01 | Discharge: 2016-07-03 | DRG: 191 | Disposition: A | Discharge status: Home / Self Care | Payer: Medicare Other | Attending: Family Medicine | Admitting: Family Medicine

## 2016-07-01 DIAGNOSIS — R0902 Hypoxemia: Secondary | ICD-10-CM | POA: Diagnosis present

## 2016-07-01 DIAGNOSIS — I10 Essential (primary) hypertension: Secondary | ICD-10-CM | POA: Diagnosis present

## 2016-07-01 DIAGNOSIS — H9222 Otorrhagia, left ear: Secondary | ICD-10-CM | POA: Insufficient documentation

## 2016-07-01 DIAGNOSIS — J449 Chronic obstructive pulmonary disease, unspecified: Secondary | ICD-10-CM | POA: Diagnosis present

## 2016-07-01 DIAGNOSIS — Y906 Blood alcohol level of 120-199 mg/100 ml: Secondary | ICD-10-CM | POA: Diagnosis present

## 2016-07-01 DIAGNOSIS — Z7982 Long term (current) use of aspirin: Secondary | ICD-10-CM

## 2016-07-01 DIAGNOSIS — Z9889 Other specified postprocedural states: Secondary | ICD-10-CM

## 2016-07-01 DIAGNOSIS — J181 Lobar pneumonia, unspecified organism: Secondary | ICD-10-CM

## 2016-07-01 DIAGNOSIS — F1011 Alcohol abuse, in remission: Secondary | ICD-10-CM | POA: Diagnosis present

## 2016-07-01 DIAGNOSIS — G40209 Localization-related (focal) (partial) symptomatic epilepsy and epileptic syndromes with complex partial seizures, not intractable, without status epilepticus: Secondary | ICD-10-CM | POA: Diagnosis present

## 2016-07-01 DIAGNOSIS — J189 Pneumonia, unspecified organism: Secondary | ICD-10-CM

## 2016-07-01 DIAGNOSIS — F10129 Alcohol abuse with intoxication, unspecified: Secondary | ICD-10-CM | POA: Diagnosis present

## 2016-07-01 DIAGNOSIS — F17200 Nicotine dependence, unspecified, uncomplicated: Secondary | ICD-10-CM | POA: Diagnosis present

## 2016-07-01 DIAGNOSIS — Z8249 Family history of ischemic heart disease and other diseases of the circulatory system: Secondary | ICD-10-CM

## 2016-07-01 DIAGNOSIS — Z79899 Other long term (current) drug therapy: Secondary | ICD-10-CM

## 2016-07-01 DIAGNOSIS — H7292 Unspecified perforation of tympanic membrane, left ear: Secondary | ICD-10-CM | POA: Insufficient documentation

## 2016-07-01 DIAGNOSIS — J441 Chronic obstructive pulmonary disease with (acute) exacerbation: Secondary | ICD-10-CM | POA: Diagnosis not present

## 2016-07-01 DIAGNOSIS — R413 Other amnesia: Secondary | ICD-10-CM | POA: Diagnosis present

## 2016-07-01 DIAGNOSIS — Z825 Family history of asthma and other chronic lower respiratory diseases: Secondary | ICD-10-CM

## 2016-07-01 DIAGNOSIS — K219 Gastro-esophageal reflux disease without esophagitis: Secondary | ICD-10-CM | POA: Diagnosis present

## 2016-07-01 DIAGNOSIS — H5462 Unqualified visual loss, left eye, normal vision right eye: Secondary | ICD-10-CM | POA: Diagnosis present

## 2016-07-01 DIAGNOSIS — Z720 Tobacco use: Secondary | ICD-10-CM | POA: Diagnosis present

## 2016-07-01 DIAGNOSIS — J9811 Atelectasis: Secondary | ICD-10-CM | POA: Diagnosis present

## 2016-07-01 LAB — COMPREHENSIVE METABOLIC PANEL
ALK PHOS: 51 U/L (ref 38–126)
ALT: 16 U/L — AB (ref 17–63)
AST: 18 U/L (ref 15–41)
Albumin: 3.8 g/dL (ref 3.5–5.0)
Anion gap: 11 (ref 5–15)
BILIRUBIN TOTAL: 0.8 mg/dL (ref 0.3–1.2)
BUN: 13 mg/dL (ref 6–20)
CALCIUM: 8.6 mg/dL — AB (ref 8.9–10.3)
CO2: 26 mmol/L (ref 22–32)
CREATININE: 0.91 mg/dL (ref 0.61–1.24)
Chloride: 103 mmol/L (ref 101–111)
Glucose, Bld: 129 mg/dL — ABNORMAL HIGH (ref 65–99)
Potassium: 3.8 mmol/L (ref 3.5–5.1)
Sodium: 140 mmol/L (ref 135–145)
TOTAL PROTEIN: 6.9 g/dL (ref 6.5–8.1)

## 2016-07-01 LAB — ETHANOL: ALCOHOL ETHYL (B): 147 mg/dL — AB (ref <5)

## 2016-07-01 LAB — CBC WITH DIFFERENTIAL/PLATELET
BASOS PCT: 0 %
Basophils Absolute: 0 10*3/uL (ref 0.0–0.1)
EOS ABS: 0.2 10*3/uL (ref 0.0–0.7)
EOS PCT: 2 %
HCT: 53.6 % — ABNORMAL HIGH (ref 39.0–52.0)
HEMOGLOBIN: 17.6 g/dL — AB (ref 13.0–17.0)
Lymphocytes Relative: 35 %
Lymphs Abs: 2.4 10*3/uL (ref 0.7–4.0)
MCH: 31.4 pg (ref 26.0–34.0)
MCHC: 32.8 g/dL (ref 30.0–36.0)
MCV: 95.7 fL (ref 78.0–100.0)
Monocytes Absolute: 0.6 10*3/uL (ref 0.1–1.0)
Monocytes Relative: 8 %
NEUTROS PCT: 55 %
Neutro Abs: 3.7 10*3/uL (ref 1.7–7.7)
PLATELETS: 137 10*3/uL — AB (ref 150–400)
RBC: 5.6 MIL/uL (ref 4.22–5.81)
RDW: 16.1 % — ABNORMAL HIGH (ref 11.5–15.5)
WBC: 6.8 10*3/uL (ref 4.0–10.5)

## 2016-07-01 LAB — RAPID URINE DRUG SCREEN, HOSP PERFORMED
AMPHETAMINES: NOT DETECTED
Barbiturates: NOT DETECTED
Benzodiazepines: POSITIVE — AB
COCAINE: NOT DETECTED
OPIATES: NOT DETECTED
TETRAHYDROCANNABINOL: NOT DETECTED

## 2016-07-01 MED ORDER — SODIUM CHLORIDE 0.9 % IV BOLUS (SEPSIS)
1000.0000 mL | Freq: Once | INTRAVENOUS | Status: AC
  Administered 2016-07-01: 1000 mL via INTRAVENOUS

## 2016-07-01 NOTE — ED Notes (Signed)
Bed: WTR9 Expected date:  Expected time:  Means of arrival:  Comments: 
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
## 2016-07-01 NOTE — ED Notes (Addendum)
Patient removed c-collar, stated that it was too uncomfortable. Patient also removed oxygen stated that he didn't need it. Patient stated that he was sleepy and wanted to take a nap. Patient also requesting food at this time. Patient allowed me to place oxygen back on him but not the c-collar.

## 2016-07-01 NOTE — ED Notes (Signed)
Pt placed on 5L O2

## 2016-07-01 NOTE — ED Notes (Signed)
Pt in X-Ray ?

## 2016-07-02 ENCOUNTER — Encounter (HOSPITAL_COMMUNITY): Payer: Self-pay | Admitting: Family Medicine

## 2016-07-02 ENCOUNTER — Emergency Department (HOSPITAL_COMMUNITY): Payer: Medicare Other

## 2016-07-02 DIAGNOSIS — Z7982 Long term (current) use of aspirin: Secondary | ICD-10-CM | POA: Diagnosis not present

## 2016-07-02 DIAGNOSIS — H5462 Unqualified visual loss, left eye, normal vision right eye: Secondary | ICD-10-CM | POA: Diagnosis present

## 2016-07-02 DIAGNOSIS — Z8249 Family history of ischemic heart disease and other diseases of the circulatory system: Secondary | ICD-10-CM | POA: Diagnosis not present

## 2016-07-02 DIAGNOSIS — F10929 Alcohol use, unspecified with intoxication, unspecified: Secondary | ICD-10-CM | POA: Diagnosis not present

## 2016-07-02 DIAGNOSIS — Z79899 Other long term (current) drug therapy: Secondary | ICD-10-CM | POA: Diagnosis not present

## 2016-07-02 DIAGNOSIS — R413 Other amnesia: Secondary | ICD-10-CM | POA: Diagnosis present

## 2016-07-02 DIAGNOSIS — J441 Chronic obstructive pulmonary disease with (acute) exacerbation: Secondary | ICD-10-CM | POA: Diagnosis not present

## 2016-07-02 DIAGNOSIS — Z825 Family history of asthma and other chronic lower respiratory diseases: Secondary | ICD-10-CM | POA: Diagnosis not present

## 2016-07-02 DIAGNOSIS — Y906 Blood alcohol level of 120-199 mg/100 ml: Secondary | ICD-10-CM | POA: Diagnosis present

## 2016-07-02 DIAGNOSIS — Z9889 Other specified postprocedural states: Secondary | ICD-10-CM | POA: Diagnosis not present

## 2016-07-02 DIAGNOSIS — J9811 Atelectasis: Secondary | ICD-10-CM | POA: Diagnosis present

## 2016-07-02 DIAGNOSIS — J449 Chronic obstructive pulmonary disease, unspecified: Secondary | ICD-10-CM | POA: Diagnosis present

## 2016-07-02 DIAGNOSIS — F17200 Nicotine dependence, unspecified, uncomplicated: Secondary | ICD-10-CM | POA: Diagnosis present

## 2016-07-02 DIAGNOSIS — G40209 Localization-related (focal) (partial) symptomatic epilepsy and epileptic syndromes with complex partial seizures, not intractable, without status epilepticus: Secondary | ICD-10-CM | POA: Diagnosis not present

## 2016-07-02 DIAGNOSIS — Z720 Tobacco use: Secondary | ICD-10-CM | POA: Diagnosis not present

## 2016-07-02 DIAGNOSIS — K219 Gastro-esophageal reflux disease without esophagitis: Secondary | ICD-10-CM | POA: Diagnosis present

## 2016-07-02 DIAGNOSIS — F10129 Alcohol abuse with intoxication, unspecified: Secondary | ICD-10-CM | POA: Diagnosis present

## 2016-07-02 DIAGNOSIS — I10 Essential (primary) hypertension: Secondary | ICD-10-CM | POA: Diagnosis present

## 2016-07-02 DIAGNOSIS — R0902 Hypoxemia: Secondary | ICD-10-CM | POA: Diagnosis present

## 2016-07-02 DIAGNOSIS — F1011 Alcohol abuse, in remission: Secondary | ICD-10-CM | POA: Diagnosis present

## 2016-07-02 LAB — MRSA PCR SCREENING: MRSA by PCR: NEGATIVE

## 2016-07-02 LAB — PROCALCITONIN: Procalcitonin: <0.1 ng/mL

## 2016-07-02 LAB — ETHANOL: ALCOHOL ETHYL (B): 97 mg/dL — AB (ref <5)

## 2016-07-02 MED ORDER — METHYLPREDNISOLONE SODIUM SUCC 125 MG IJ SOLR
60.0000 mg | Freq: Every day | INTRAMUSCULAR | Status: DC
  Administered 2016-07-02 – 2016-07-03 (×2): 60 mg via INTRAVENOUS
  Filled 2016-07-02 (×2): qty 2

## 2016-07-02 MED ORDER — ACETAMINOPHEN 650 MG RE SUPP: 650.0000 mg | Freq: Four times a day (QID) | RECTAL | Status: DC | PRN

## 2016-07-02 MED ORDER — ACETAMINOPHEN 325 MG PO TABS
650.0000 mg | ORAL_TABLET | Freq: Once | ORAL | Status: AC
  Administered 2016-07-02: 650 mg via ORAL
  Filled 2016-07-02: qty 2

## 2016-07-02 MED ORDER — ALBUTEROL SULFATE (2.5 MG/3ML) 0.083% IN NEBU: 2.5000 mg | INHALATION_SOLUTION | RESPIRATORY_TRACT | Status: DC | PRN

## 2016-07-02 MED ORDER — IPRATROPIUM-ALBUTEROL 0.5-2.5 (3) MG/3ML IN SOLN
3.0000 mL | Freq: Once | RESPIRATORY_TRACT | Status: AC
  Administered 2016-07-02: 3 mL via RESPIRATORY_TRACT
  Filled 2016-07-02: qty 3

## 2016-07-02 MED ORDER — DIVALPROEX SODIUM ER 500 MG PO TB24
1000.0000 mg | ORAL_TABLET | Freq: Every day | ORAL | Status: DC
  Administered 2016-07-02 – 2016-07-03 (×2): 1000 mg via ORAL
  Filled 2016-07-02 (×2): qty 2

## 2016-07-02 MED ORDER — ENOXAPARIN SODIUM 40 MG/0.4ML ~~LOC~~ SOLN
40.0000 mg | SUBCUTANEOUS | Status: DC
  Administered 2016-07-02 – 2016-07-03 (×2): 40 mg via SUBCUTANEOUS
  Filled 2016-07-02 (×2): qty 0.4

## 2016-07-02 MED ORDER — FOLIC ACID 1 MG PO TABS
1.0000 mg | ORAL_TABLET | Freq: Every day | ORAL | Status: DC
  Administered 2016-07-02 – 2016-07-03 (×2): 1 mg via ORAL
  Filled 2016-07-02 (×2): qty 1

## 2016-07-02 MED ORDER — VITAMIN B-1 100 MG PO TABS
100.0000 mg | ORAL_TABLET | Freq: Every day | ORAL | Status: DC
  Administered 2016-07-02 – 2016-07-03 (×2): 100 mg via ORAL
  Filled 2016-07-02 (×2): qty 1

## 2016-07-02 MED ORDER — ALPRAZOLAM 1 MG PO TABS
1.0000 mg | ORAL_TABLET | Freq: Three times a day (TID) | ORAL | Status: DC | PRN
  Administered 2016-07-03: 1 mg via ORAL
  Filled 2016-07-02: qty 1

## 2016-07-02 MED ORDER — DEXTROSE 5 % IV SOLN
1.0000 g | Freq: Once | INTRAVENOUS | Status: AC
  Administered 2016-07-02: 1 g via INTRAVENOUS
  Filled 2016-07-02: qty 10

## 2016-07-02 MED ORDER — GUAIFENESIN ER 600 MG PO TB12
600.0000 mg | ORAL_TABLET | Freq: Two times a day (BID) | ORAL | Status: DC
  Administered 2016-07-02 – 2016-07-03 (×3): 600 mg via ORAL
  Filled 2016-07-02 (×3): qty 1

## 2016-07-02 MED ORDER — ADULT MULTIVITAMIN W/MINERALS CH
1.0000 | ORAL_TABLET | Freq: Every day | ORAL | Status: DC
  Administered 2016-07-02 – 2016-07-03 (×2): 1 via ORAL
  Filled 2016-07-02 (×2): qty 1

## 2016-07-02 MED ORDER — LACOSAMIDE 50 MG PO TABS
100.0000 mg | ORAL_TABLET | Freq: Two times a day (BID) | ORAL | Status: DC
  Administered 2016-07-02 – 2016-07-03 (×3): 100 mg via ORAL
  Filled 2016-07-02 (×3): qty 2

## 2016-07-02 MED ORDER — HYDROCODONE-ACETAMINOPHEN 5-325 MG PO TABS
1.0000 | ORAL_TABLET | Freq: Once | ORAL | Status: DC
  Filled 2016-07-02: qty 1

## 2016-07-02 MED ORDER — DOXYCYCLINE HYCLATE 100 MG PO TABS
100.0000 mg | ORAL_TABLET | Freq: Two times a day (BID) | ORAL | Status: DC
  Administered 2016-07-02 – 2016-07-03 (×3): 100 mg via ORAL
  Filled 2016-07-02 (×3): qty 1

## 2016-07-02 MED ORDER — BUDESONIDE 0.25 MG/2ML IN SUSP
0.2500 mg | Freq: Two times a day (BID) | RESPIRATORY_TRACT | Status: DC
  Administered 2016-07-02 – 2016-07-03 (×3): 0.25 mg via RESPIRATORY_TRACT
  Filled 2016-07-02 (×3): qty 2

## 2016-07-02 MED ORDER — ACETAMINOPHEN 325 MG PO TABS: 650.0000 mg | ORAL_TABLET | Freq: Four times a day (QID) | ORAL | Status: DC | PRN

## 2016-07-02 MED ORDER — LORAZEPAM 1 MG PO TABS
1.0000 mg | ORAL_TABLET | Freq: Four times a day (QID) | ORAL | Status: DC | PRN
  Administered 2016-07-02: 1 mg via ORAL
  Filled 2016-07-02: qty 1

## 2016-07-02 MED ORDER — THIAMINE HCL 100 MG/ML IJ SOLN: 100.0000 mg | Freq: Every day | INTRAMUSCULAR | Status: DC

## 2016-07-02 MED ORDER — DEXTROSE 5 % IV SOLN
500.0000 mg | Freq: Once | INTRAVENOUS | Status: AC
  Administered 2016-07-02: 500 mg via INTRAVENOUS
  Filled 2016-07-02: qty 500

## 2016-07-02 MED ORDER — HYDROCODONE-ACETAMINOPHEN 5-325 MG PO TABS
1.0000 | ORAL_TABLET | Freq: Four times a day (QID) | ORAL | Status: DC | PRN
  Administered 2016-07-02 – 2016-07-03 (×3): 1 via ORAL
  Filled 2016-07-02 (×3): qty 1

## 2016-07-02 MED ORDER — ALBUTEROL SULFATE (2.5 MG/3ML) 0.083% IN NEBU
2.5000 mg | INHALATION_SOLUTION | Freq: Four times a day (QID) | RESPIRATORY_TRACT | Status: DC
  Administered 2016-07-02 – 2016-07-03 (×6): 2.5 mg via RESPIRATORY_TRACT
  Filled 2016-07-02 (×6): qty 3

## 2016-07-02 MED ORDER — LORAZEPAM 2 MG/ML IJ SOLN: 1.0000 mg | Freq: Four times a day (QID) | INTRAMUSCULAR | Status: DC | PRN
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
## 2016-07-02 NOTE — Progress Notes (Signed)
Pt was given flutter valve by rt. Pt knows and understands how to use. 

## 2016-07-02 NOTE — ED Notes (Signed)
Pt transported to XRay 

## 2016-07-02 NOTE — Care Management Note (Signed)
Case Management Note  Patient Details  Name: Harold STEMARIE MRN: [id number] Date of Birth: 01-31-1966  Subjective/Objective: 50 y/o m admitted w/COPD. From home. Hx: smokes 1/2 pack cigarettes a day.                   Action/Plan:d/c plan home.   Expected Discharge Date:                  Expected Discharge Plan:  Home/Self Care  In-House Referral:     Discharge planning Services  CM Consult  Post Acute Care Choice:    Choice offered to:     DME Arranged:    DME Agency:     HH Arranged:    HH Agency:     Status of Service:  In process, will continue to follow  If discussed at Long Length of Stay Meetings, dates discussed:    Additional Comments:  Dessa Phi, RN 07/02/2016, 11:07 AM
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
## 2016-07-02 NOTE — Care Management Obs Status (Signed)
[facility] NOTIFICATION   Patient Details  Name: Harold Bailey MRN: [id number] Date of Birth: 05/09/1966   Medicare Observation Status Notification Given:  Yes    MahabirJuliann Pulse, RN 07/02/2016, 11:06 AM

## 2016-07-02 NOTE — ED Notes (Signed)
Pt's O2 sats 86% on room air; no improvement with change in position; pt placed on 3lpm via  O2
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
## 2016-07-03 DIAGNOSIS — J441 Chronic obstructive pulmonary disease with (acute) exacerbation: Principal | ICD-10-CM

## 2016-07-03 DIAGNOSIS — F10929 Alcohol use, unspecified with intoxication, unspecified: Secondary | ICD-10-CM

## 2016-07-03 DIAGNOSIS — G40209 Localization-related (focal) (partial) symptomatic epilepsy and epileptic syndromes with complex partial seizures, not intractable, without status epilepticus: Secondary | ICD-10-CM

## 2016-07-03 DIAGNOSIS — Z720 Tobacco use: Secondary | ICD-10-CM

## 2016-07-03 LAB — CBC
HCT: 51.6 % (ref 39.0–52.0)
Hemoglobin: 16.8 g/dL (ref 13.0–17.0)
MCH: 30.9 pg (ref 26.0–34.0)
MCHC: 32.6 g/dL (ref 30.0–36.0)
MCV: 95 fL (ref 78.0–100.0)
Platelets: 124 10*3/uL — ABNORMAL LOW (ref 150–400)
RBC: 5.43 MIL/uL (ref 4.22–5.81)
RDW: 16.2 % — ABNORMAL HIGH (ref 11.5–15.5)
WBC: 6.9 10*3/uL (ref 4.0–10.5)

## 2016-07-03 MED ORDER — PREDNISONE 50 MG PO TABS: 50.0000 mg | ORAL_TABLET | Freq: Every day | ORAL | 0 refills | Status: AC

## 2016-07-03 MED ORDER — ADULT MULTIVITAMIN W/MINERALS CH
1.0000 | ORAL_TABLET | Freq: Every day | ORAL | 0 refills | Status: DC
Start: 2016-07-04 — End: 2017-05-27

## 2016-07-03 MED ORDER — GUAIFENESIN ER 600 MG PO TB12: 600.0000 mg | ORAL_TABLET | Freq: Two times a day (BID) | ORAL | 0 refills | Status: DC

## 2016-07-03 MED ORDER — FLUTICASONE-SALMETEROL 250-50 MCG/DOSE IN AEPB: 1.0000 | INHALATION_SPRAY | Freq: Two times a day (BID) | RESPIRATORY_TRACT | 0 refills | Status: DC

## 2016-07-03 MED ORDER — THIAMINE HCL 100 MG PO TABS: 100.0000 mg | ORAL_TABLET | Freq: Every day | ORAL | 0 refills | Status: DC

## 2016-07-03 MED ORDER — DOXYCYCLINE HYCLATE 100 MG PO TABS: 100.0000 mg | ORAL_TABLET | Freq: Two times a day (BID) | ORAL | 0 refills | Status: AC

## 2016-07-03 MED ORDER — ALBUTEROL SULFATE HFA 108 (90 BASE) MCG/ACT IN AERS: 2.0000 | INHALATION_SPRAY | RESPIRATORY_TRACT | 1 refills | Status: DC | PRN
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
## 2016-07-03 NOTE — Care Management Note (Signed)
Case Management Note  Patient Details  Name: Harold Bailey MRN: [id number] Date of Birth: 27-Oct-1965  Subjective/Objective:PT cons immenent-await recc. Patient chose [facility] if [facility] needed-[facility] rep aware. Await recc, & HHC orders if needed.                    Action/Plan:d/c plan home.   Expected Discharge Date:                  Expected Discharge Plan:  Home/Self Care  In-House Referral:     Discharge planning Services  CM Consult  Post Acute Care Choice:    Choice offered to:     DME Arranged:    DME Agency:     HH Arranged:    HH Agency:     Status of Service:  In process, will continue to follow  If discussed at Long Length of Stay Meetings, dates discussed:    Additional Comments:  Dessa Phi, RN 07/03/2016, 11:54 AM
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
## 2016-07-03 NOTE — Care Management Note (Signed)
Case Management Note  Patient Details  Name: Harold Bailey MRN: [id number] Date of Birth: 22-Feb-1966  Subjective/Objective:  PT-no f/u. No further CM needs.                  Action/Plan:d/c home.   Expected Discharge Date:                  Expected Discharge Plan:  Home/Self Care  In-House Referral:     Discharge planning Services  CM Consult  Post Acute Care Choice:    Choice offered to:     DME Arranged:    DME Agency:     HH Arranged:    [place] Agency:     Status of Service:  Completed, signed off  If discussed at [employer] of Stay Meetings, dates discussed:    Additional Comments:  Dessa Phi, RN 07/03/2016, 2:09 PM

## 2016-07-18 ENCOUNTER — Other Ambulatory Visit (INDEPENDENT_AMBULATORY_CARE_PROVIDER_SITE_OTHER): Payer: Self-pay | Admitting: Orthopedic Surgery

## 2016-07-18 ENCOUNTER — Other Ambulatory Visit: Payer: Self-pay | Admitting: Neurology

## 2016-07-25 ENCOUNTER — Ambulatory Visit: Payer: Medicare Other | Admitting: Neurology

## 2016-08-02 ENCOUNTER — Telehealth (INDEPENDENT_AMBULATORY_CARE_PROVIDER_SITE_OTHER): Payer: Self-pay | Admitting: *Deleted

## 2016-08-02 NOTE — Telephone Encounter (Signed)
ERROR

## 2016-08-06 ENCOUNTER — Telehealth (INDEPENDENT_AMBULATORY_CARE_PROVIDER_SITE_OTHER): Payer: Self-pay | Admitting: Radiology

## 2016-08-06 NOTE — Telephone Encounter (Signed)
He's calling to see if we received the faxed Rx for ankle and elbow braces.  Call him to advise. He is faxing it again today, and it was faxed again on Friday 08/02/16.

## 2016-08-08 NOTE — Telephone Encounter (Signed)
Harold Bailey called about form for braces to see if we have received these yet. [phone]

## 2016-08-09 NOTE — Telephone Encounter (Signed)
I called and left a voicemail that I could not find any paperwork in regards to either ankle or elbow brace. Asked him to call back and let us know what he is needing the brace for and if he could please resend through fax or email.

## 2016-08-14 ENCOUNTER — Other Ambulatory Visit: Payer: Self-pay | Admitting: Neurology

## 2016-08-19 NOTE — Telephone Encounter (Signed)
  1.  Due to early satiety, we will taper off of topamax by 50mg  weekly until off. 2.  We will increase Depakote ER to 1000mg  daily 3.  Continue lacosamide 100mg  twice daily    3 month supply of Depakote sent to pharmacy. Pt scheduled for ov 11/06/16. Pt no showed on 06/04/16 and cancelled 07/25/16. Will write note to on Sig that he must keep Feb. Appt.

## 2016-08-22 NOTE — Telephone Encounter (Signed)
IC [name] and advised no I do not see these forms, I have given them other fax number to send to.  [phone]

## 2016-08-30 ENCOUNTER — Other Ambulatory Visit (INDEPENDENT_AMBULATORY_CARE_PROVIDER_SITE_OTHER): Payer: Self-pay | Admitting: Orthopedic Surgery

## 2016-08-30 ENCOUNTER — Ambulatory Visit (INDEPENDENT_AMBULATORY_CARE_PROVIDER_SITE_OTHER): Payer: Medicare Other

## 2016-08-30 ENCOUNTER — Encounter (INDEPENDENT_AMBULATORY_CARE_PROVIDER_SITE_OTHER): Payer: Self-pay | Admitting: Orthopedic Surgery

## 2016-08-30 ENCOUNTER — Other Ambulatory Visit: Payer: Self-pay | Admitting: Neurology

## 2016-08-30 ENCOUNTER — Ambulatory Visit (INDEPENDENT_AMBULATORY_CARE_PROVIDER_SITE_OTHER): Payer: Medicare Other | Admitting: Orthopedic Surgery

## 2016-08-30 VITALS — Ht 72.0 in | Wt 231.0 lb

## 2016-08-30 DIAGNOSIS — M25572 Pain in left ankle and joints of left foot: Secondary | ICD-10-CM | POA: Diagnosis not present

## 2016-08-30 DIAGNOSIS — M24672 Ankylosis, left ankle: Secondary | ICD-10-CM | POA: Diagnosis not present

## 2016-08-30 MED ORDER — OXYCODONE-ACETAMINOPHEN 5-325 MG PO TABS: 1.0000 | ORAL_TABLET | Freq: Three times a day (TID) | ORAL | 0 refills | Status: DC | PRN

## 2016-08-30 NOTE — Progress Notes (Signed)
Office Visit Note   Patient: Harold Bailey           Date of Birth: 25-Sep-1965           MRN: [id number] Visit Date: 08/30/2016              Requested by: Everardo Beals, NP [facility] [address] [place], [place] [address] PCP: Imelda Pillow, NP   Assessment & Plan: Visit Diagnoses:  1. Fibrosis of subtalar joint, left   2. Pain in left ankle and joints of left foot     Plan: Due to patient's persistent pain despite immobilization with splinting he would like to proceed with subtalar fusion. Risk and benefits were discussed including risk of increased arthritis in the midfoot. Patient states he understands wishes to proceed at this time we will plan for a posterior arthroscopic subtalar arthrodesis with fusion was 6.5 headless cannulated screws at [facility] patient will need discharge to skilled nursing postoperatively. Prescription provided for Percocet for pain. He is to discontinue using his goodie powders at this time.  Follow-Up Instructions: Return in about 2 weeks (around 09/13/2016).   Orders:  Orders Placed This Encounter  Procedures  . XR Ankle Complete Left   Meds ordered this encounter  Medications  . oxyCODONE-acetaminophen (ROXICET) 5-325 MG tablet    Sig: Take 1 tablet by mouth every 8 (eight) hours as needed for severe pain.    Dispense:  60 tablet    Refill:  0      Procedures: No procedures performed   Clinical Data: No additional findings.   Subjective: Chief Complaint  Patient presents with  . Left Ankle - Pain    Tibiotalar fusion with subtalar arthrosis    Patient presents with left ankle pain. He would like to discuss surgical intervention. He was last seen 02/29/2016 and had stable tibiotalar fusion with subtalar arthrosis. He has also discussed this previously on 02/29/16 and was told about the increase risk for infection at this office visit. He states at times he cannot even walk, he is taking  multiple BC powders. He complains of stiffness and also ambulates with cane today.     Review of Systems   Objective: Vital Signs: Ht 6' (1.829 m)   Wt 231 lb (104.8 kg)   BMI 31.33 kg/m   Physical ExamOrtho Exam  on examination patient is alert oriented no adenopathy well-dressed normal affect normal respiratory effort he does have an antalgic gait he is wearing a brace. He is tender to palpation over the sinus Tarsi. Attempted subtalar motion is painful he has no subtalar motion he has palpable pulses he has a varicose veins with swelling he has a large lateral incision from his previous fusion.  Specialty Comments:  No specialty comments available.  Imaging: Xr Ankle Complete Left  Result Date: 08/30/2016 Three-view radiographs of the left ankle show a stable ankle fusion. Radiographs show collapse of the subtalar joint on the left.    PMFS History: Patient Active Problem List   Diagnosis Date Noted  . 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 COPD with exacerbation ([place]) 07/02/2016  . Alcohol intoxication ([place]) 07/02/2016  . COPD exacerbation ([place]) 07/02/2016  . Tobacco dependence 06/06/2015  . Medication overuse headache 02/28/2015  . Localization-related symptomatic epilepsy and epileptic syndromes with complex partial seizures, not intractable, without status epilepticus ([place]) 02/28/2015  . Cognitive impairment 02/28/2015  . 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 Tobacco abuse 02/28/2015  . Brain  tumor ([place]) 11/19/2013  . Seizures ([place]) 11/19/2013  . Memory loss   . HA (headache)   . Gait disturbance   . Pain in joint, ankle and foot 10/15/2013  . Falls frequently 10/15/2013  . Swelling 09/09/2013  . Rash and nonspecific skin eruption 09/09/2013  . Ankle fracture, lateral malleolus, closed 09/03/2013  . Unspecified vitamin D deficiency 08/12/2013  . Polycythemia   . Memory deficit   . Drug abuse   . GERD (gastroesophageal reflux disease)   . Anxiety   .  Arthritis   . Hyperlipidemia   . Prediabetes    Past Medical History:  Diagnosis Date  . Anxiety   . Arthritis    Left ankle  . Drug abuse   . Gait disturbance   . GERD (gastroesophageal reflux disease)   . HA (headache)   . 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  Marland Kitchen Hyperlipidemia   . Hypertension   . 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 Memory loss   . Peripheral vision loss    Left due to brain surgery  . Polycythemia   . Prediabetes   . Seizures ([place]) 11/19/2013  . Shortness of breath    Hx of smoking.    Family History  Problem Relation Age of Onset  . Hypertension Mother   . Bronchitis Mother     Past Surgical History:  Procedure Laterality Date  . ANKLE ARTHROSCOPY  01/17/2012   Procedure: ANKLE ARTHROSCOPY;  Surgeon: [name], MD;  Location: [place];  Service: Orthopedics;  Laterality: Left;  . ANKLE FUSION Left 11/2012   Dr Sharol Given  . ANKLE SURGERY  2003   Left  . BRAIN SURGERY  2009   Biopsy  . FRACTURE SURGERY  2003   Left ankle  . HARDWARE REMOVAL Left 12/08/2012   Procedure: HARDWARE REMOVAL;  Surgeon: [name], MD;  Location: [place];  Service: Orthopedics;  Laterality: Left;  Removal Deep Hardware, Take Down Non-Union, Revision Internal Fixation Left Ankle  . I&D EXTREMITY Left 01/01/2013   Procedure: IRRIGATION AND DEBRIDEMENT EXTREMITY;  Surgeon: [name], MD;  Location: [place];  Service: Orthopedics;  Laterality: Left;  Irrigation, Debridement and Placement Antibiotic Beads Left Ankle  . INNER EAR SURGERY    . OPEN REDUCTION INTERNAL FIXATION (ORIF) SCAPHOID WITH DISTAL RADIUS GRAFT Right 02/01/2013   Procedure: OPEN REDUCTION INTERNAL FIXATION (ORIF) RIGHT SCAPHOID FRACTURE WITH DISTAL RADIUS GRAFT;  Surgeon: Schuyler Amor, MD;  Location: [place];  Service: Orthopedics;  Laterality: Right;  . ORIF ANKLE FRACTURE Left 12/08/2012   Procedure: OPEN REDUCTION INTERNAL  FIXATION (ORIF) ANKLE FRACTURE;  Surgeon: [name], MD;  Location: [place];  Service: Orthopedics;  Laterality: Left;  Removal Deep Hardware, Take Down Non-Union, Revision Internal Fixation Left Ankle  . ORIF ANKLE FRACTURE Right 09/03/2013   Procedure: OPEN REDUCTION INTERNAL FIXATION (ORIF) ANKLE FRACTURE;  Surgeon: [name], MD;  Location: [place];  Service: Orthopedics;  Laterality: Right;  Open Reduction Internal Fixation Right Fibula  . SCAPHOID FRACTURE SURGERY  02/04/2013  . SHOULDER ARTHROSCOPY  2008   Left  . TYMPANOSTOMY TUBE PLACEMENT  1993   Social History   Occupational History  .  Not Employed    Disabled   Social History Main Topics  . Smoking status: Current Every Day Smoker    Packs/day: 1.50    Years: 30.00  Types: Cigarettes  . Smokeless tobacco: Never Used  . Alcohol use No     Comment: quit 2013  . Drug use: No  . Sexual activity: Not on file

## 2016-09-02 ENCOUNTER — Telehealth (INDEPENDENT_AMBULATORY_CARE_PROVIDER_SITE_OTHER): Payer: Self-pay | Admitting: Orthopedic Surgery

## 2016-09-18 NOTE — Telephone Encounter (Signed)
Gave verbal over the phone for chronic bilateral elbow and ankle pain dx, and ICD10 coding provided.

## 2016-09-19 ENCOUNTER — Other Ambulatory Visit (INDEPENDENT_AMBULATORY_CARE_PROVIDER_SITE_OTHER): Payer: Self-pay | Admitting: Family

## 2016-09-19 ENCOUNTER — Telehealth (INDEPENDENT_AMBULATORY_CARE_PROVIDER_SITE_OTHER): Payer: Self-pay | Admitting: Orthopedic Surgery

## 2016-09-24 ENCOUNTER — Other Ambulatory Visit (INDEPENDENT_AMBULATORY_CARE_PROVIDER_SITE_OTHER): Payer: Self-pay | Admitting: Orthopedic Surgery

## 2016-09-24 MED ORDER — OXYCODONE-ACETAMINOPHEN 5-325 MG PO TABS: 1.0000 | ORAL_TABLET | Freq: Three times a day (TID) | ORAL | 0 refills | Status: DC | PRN

## 2016-09-24 NOTE — Telephone Encounter (Signed)
Pt called for RX percocet refill

## 2016-09-24 NOTE — Telephone Encounter (Signed)
rx written

## 2016-09-24 NOTE — Telephone Encounter (Signed)
Pt would like a refill on his percocet 5/325mg . Last refill was on 08/30/16 #60 he is sch for surgery this month

## 2016-09-25 NOTE — Telephone Encounter (Signed)
I called patient to advise rx at front desk for pick up.

## 2016-09-27 ENCOUNTER — Other Ambulatory Visit: Payer: Self-pay | Admitting: Neurology

## 2016-09-30 ENCOUNTER — Telehealth (INDEPENDENT_AMBULATORY_CARE_PROVIDER_SITE_OTHER): Payer: Self-pay | Admitting: Orthopedic Surgery

## 2016-09-30 NOTE — Telephone Encounter (Signed)
Harold Bailey called this afternoon with some questions about his upcoming surgery.  He is scheduled for subtalar arthrodesis this Wed 09/23/16.  He still has questions about the procedure.  He wants to know "what's the purpose of having it done".  "Is it going to fix me?".  He states that he is getting nervous since surgery is so closed.  Please call him to discuss.

## 2016-09-30 NOTE — Telephone Encounter (Signed)
Please call patient and let him know that we are revising the fusion to decrease his symptoms.

## 2016-10-01 ENCOUNTER — Encounter (HOSPITAL_COMMUNITY): Payer: Self-pay | Admitting: *Deleted

## 2016-10-01 NOTE — Telephone Encounter (Signed)
I spoke with Harold Bailey and advised of Dr. Jess Barters message.  He states that he understands and wishes to proceed with the surgery as scheduled.

## 2016-10-01 NOTE — Progress Notes (Signed)
Spoke with pt for pre-op call. Pt denies cardiac history, chest pain or sob. Pt has hx of seizures, none for 1 1/2 - 2 years.

## 2016-10-02 ENCOUNTER — Inpatient Hospital Stay (HOSPITAL_COMMUNITY)
Admission: RE | Admit: 2016-10-02 | Discharge: 2016-10-05 | DRG: 494 | Disposition: A | Discharge status: Home / Self Care | Payer: Medicare Other | Source: Ambulatory Visit | Attending: Orthopedic Surgery | Admitting: Orthopedic Surgery

## 2016-10-02 ENCOUNTER — Inpatient Hospital Stay (HOSPITAL_COMMUNITY): Payer: Medicare Other | Admitting: Anesthesiology

## 2016-10-02 ENCOUNTER — Encounter (HOSPITAL_COMMUNITY): Payer: Self-pay | Admitting: Certified Registered"

## 2016-10-02 ENCOUNTER — Encounter (HOSPITAL_COMMUNITY)
Admission: RE | Disposition: A | Discharge status: Home / Self Care | Payer: Self-pay | Source: Ambulatory Visit | Attending: Orthopedic Surgery

## 2016-10-02 DIAGNOSIS — Z7951 Long term (current) use of inhaled steroids: Secondary | ICD-10-CM

## 2016-10-02 DIAGNOSIS — F419 Anxiety disorder, unspecified: Secondary | ICD-10-CM | POA: Diagnosis present

## 2016-10-02 DIAGNOSIS — R262 Difficulty in walking, not elsewhere classified: Secondary | ICD-10-CM

## 2016-10-02 DIAGNOSIS — Z79891 Long term (current) use of opiate analgesic: Secondary | ICD-10-CM | POA: Diagnosis not present

## 2016-10-02 DIAGNOSIS — E785 Hyperlipidemia, unspecified: Secondary | ICD-10-CM | POA: Diagnosis present

## 2016-10-02 DIAGNOSIS — M19072 Primary osteoarthritis, left ankle and foot: Principal | ICD-10-CM | POA: Diagnosis present

## 2016-10-02 DIAGNOSIS — Z79899 Other long term (current) drug therapy: Secondary | ICD-10-CM

## 2016-10-02 DIAGNOSIS — M779 Enthesopathy, unspecified: Secondary | ICD-10-CM | POA: Diagnosis present

## 2016-10-02 DIAGNOSIS — M25375 Other instability, left foot: Secondary | ICD-10-CM | POA: Diagnosis not present

## 2016-10-02 DIAGNOSIS — I10 Essential (primary) hypertension: Secondary | ICD-10-CM | POA: Diagnosis present

## 2016-10-02 DIAGNOSIS — Z791 Long term (current) use of non-steroidal anti-inflammatories (NSAID): Secondary | ICD-10-CM | POA: Diagnosis not present

## 2016-10-02 DIAGNOSIS — G40909 Epilepsy, unspecified, not intractable, without status epilepticus: Secondary | ICD-10-CM | POA: Diagnosis present

## 2016-10-02 DIAGNOSIS — J449 Chronic obstructive pulmonary disease, unspecified: Secondary | ICD-10-CM | POA: Diagnosis present

## 2016-10-02 DIAGNOSIS — R269 Unspecified abnormalities of gait and mobility: Secondary | ICD-10-CM | POA: Diagnosis present

## 2016-10-02 DIAGNOSIS — Z8249 Family history of ischemic heart disease and other diseases of the circulatory system: Secondary | ICD-10-CM | POA: Diagnosis not present

## 2016-10-02 DIAGNOSIS — H5462 Unqualified visual loss, left eye, normal vision right eye: Secondary | ICD-10-CM | POA: Diagnosis present

## 2016-10-02 DIAGNOSIS — Z825 Family history of asthma and other chronic lower respiratory diseases: Secondary | ICD-10-CM

## 2016-10-02 DIAGNOSIS — F17210 Nicotine dependence, cigarettes, uncomplicated: Secondary | ICD-10-CM | POA: Diagnosis present

## 2016-10-02 DIAGNOSIS — K219 Gastro-esophageal reflux disease without esophagitis: Secondary | ICD-10-CM | POA: Diagnosis present

## 2016-10-02 HISTORY — PX: ANKLE ARTHROSCOPY WITH FUSION: SHX5581

## 2016-10-02 HISTORY — DX: Chronic obstructive pulmonary disease, unspecified: J44.9

## 2016-10-02 HISTORY — DX: Pneumonia, unspecified organism: J18.9

## 2016-10-02 LAB — CBC
HCT: 54.4 % — ABNORMAL HIGH (ref 39.0–52.0)
HEMOGLOBIN: 18.2 g/dL — AB (ref 13.0–17.0)
MCH: 32.1 pg (ref 26.0–34.0)
MCHC: 33.5 g/dL (ref 30.0–36.0)
MCV: 95.9 fL (ref 78.0–100.0)
Platelets: 156 10*3/uL (ref 150–400)
RBC: 5.67 MIL/uL (ref 4.22–5.81)
RDW: 14.6 % (ref 11.5–15.5)
WBC: 7.1 10*3/uL (ref 4.0–10.5)

## 2016-10-02 LAB — BASIC METABOLIC PANEL
Anion gap: 6 (ref 5–15)
BUN: 20 mg/dL (ref 6–20)
CHLORIDE: 109 mmol/L (ref 101–111)
CO2: 26 mmol/L (ref 22–32)
Calcium: 9.3 mg/dL (ref 8.9–10.3)
Creatinine, Ser: 0.74 mg/dL (ref 0.61–1.24)
GFR calc Af Amer: >60 mL/min (ref >60)
GFR calc non Af Amer: >60 mL/min (ref >60)
Glucose, Bld: 101 mg/dL — ABNORMAL HIGH (ref 65–99)
POTASSIUM: 4.5 mmol/L (ref 3.5–5.1)
SODIUM: 141 mmol/L (ref 135–145)

## 2016-10-02 SURGERY — ANKLE ARTHROSCOPY WITH FUSION
Anesthesia: General | Site: Ankle | Laterality: Left

## 2016-10-02 MED ORDER — METHOCARBAMOL 500 MG PO TABS
500.0000 mg | ORAL_TABLET | Freq: Four times a day (QID) | ORAL | Status: DC | PRN
  Administered 2016-10-03 – 2016-10-04 (×4): 500 mg via ORAL
  Filled 2016-10-02 (×4): qty 1

## 2016-10-02 MED ORDER — ONDANSETRON HCL 4 MG PO TABS: 4.0000 mg | ORAL_TABLET | Freq: Four times a day (QID) | ORAL | Status: DC | PRN

## 2016-10-02 MED ORDER — BISACODYL 10 MG RE SUPP: 10.0000 mg | Freq: Every day | RECTAL | Status: DC | PRN

## 2016-10-02 MED ORDER — LIDOCAINE 2% (20 MG/ML) 5 ML SYRINGE
INTRAMUSCULAR | Status: DC | PRN
  Administered 2016-10-02: 60 mg via INTRAVENOUS

## 2016-10-02 MED ORDER — SODIUM CHLORIDE 0.9 % IR SOLN
Status: DC | PRN
  Administered 2016-10-02 (×2): 3000 mL

## 2016-10-02 MED ORDER — POLYETHYLENE GLYCOL 3350 17 G PO PACK: 17.0000 g | PACK | Freq: Every day | ORAL | Status: DC | PRN

## 2016-10-02 MED ORDER — SODIUM CHLORIDE 0.9 % IV SOLN
INTRAVENOUS | Status: DC
  Administered 2016-10-02: 15:00:00 via INTRAVENOUS

## 2016-10-02 MED ORDER — LACTATED RINGERS IV SOLN
INTRAVENOUS | Status: DC
  Administered 2016-10-02: 11:00:00 via INTRAVENOUS
  Administered 2016-10-02: 50 mL/h via INTRAVENOUS

## 2016-10-02 MED ORDER — GABAPENTIN 300 MG PO CAPS
300.0000 mg | ORAL_CAPSULE | Freq: Three times a day (TID) | ORAL | Status: DC
  Administered 2016-10-02 – 2016-10-05 (×9): 300 mg via ORAL
  Filled 2016-10-02 (×9): qty 1

## 2016-10-02 MED ORDER — OXYCODONE HCL 5 MG PO TABS
5.0000 mg | ORAL_TABLET | ORAL | Status: DC | PRN
  Administered 2016-10-02 – 2016-10-05 (×16): 10 mg via ORAL
  Filled 2016-10-02 (×16): qty 2

## 2016-10-02 MED ORDER — BUPIVACAINE HCL (PF) 0.25 % IJ SOLN
INTRAMUSCULAR | Status: AC
  Filled 2016-10-02: qty 30

## 2016-10-02 MED ORDER — MIDAZOLAM HCL 2 MG/2ML IJ SOLN
INTRAMUSCULAR | Status: AC
  Filled 2016-10-02: qty 2

## 2016-10-02 MED ORDER — HYDROMORPHONE HCL 1 MG/ML IJ SOLN
0.2500 mg | INTRAMUSCULAR | Status: DC | PRN
  Administered 2016-10-02 (×4): 0.5 mg via INTRAVENOUS

## 2016-10-02 MED ORDER — ONDANSETRON HCL 4 MG/2ML IJ SOLN
INTRAMUSCULAR | Status: AC
  Filled 2016-10-02: qty 2

## 2016-10-02 MED ORDER — ACETAMINOPHEN 325 MG PO TABS
650.0000 mg | ORAL_TABLET | Freq: Four times a day (QID) | ORAL | Status: DC | PRN
  Administered 2016-10-02 – 2016-10-04 (×2): 650 mg via ORAL
  Filled 2016-10-02 (×2): qty 2

## 2016-10-02 MED ORDER — PROPOFOL 10 MG/ML IV BOLUS
INTRAVENOUS | Status: DC | PRN
  Administered 2016-10-02: 140 mg via INTRAVENOUS

## 2016-10-02 MED ORDER — PROPOFOL 10 MG/ML IV BOLUS
INTRAVENOUS | Status: AC
  Filled 2016-10-02: qty 20

## 2016-10-02 MED ORDER — ALBUTEROL SULFATE HFA 108 (90 BASE) MCG/ACT IN AERS
INHALATION_SPRAY | RESPIRATORY_TRACT | Status: DC | PRN
  Administered 2016-10-02: 4 via RESPIRATORY_TRACT

## 2016-10-02 MED ORDER — ONDANSETRON HCL 4 MG/2ML IJ SOLN
INTRAMUSCULAR | Status: DC | PRN
  Administered 2016-10-02: 4 mg via INTRAVENOUS

## 2016-10-02 MED ORDER — LACOSAMIDE 100 MG PO TABS: 1.0000 | ORAL_TABLET | Freq: Two times a day (BID) | ORAL | Status: DC

## 2016-10-02 MED ORDER — CHLORHEXIDINE GLUCONATE 4 % EX LIQD: 60.0000 mL | Freq: Once | CUTANEOUS | Status: DC

## 2016-10-02 MED ORDER — FENTANYL CITRATE (PF) 100 MCG/2ML IJ SOLN
INTRAMUSCULAR | Status: AC
  Filled 2016-10-02: qty 2

## 2016-10-02 MED ORDER — HYDROMORPHONE HCL 2 MG/ML IJ SOLN
INTRAMUSCULAR | Status: AC
  Administered 2016-10-02: 2 mg
  Filled 2016-10-02: qty 1

## 2016-10-02 MED ORDER — CEFAZOLIN SODIUM-DEXTROSE 2-4 GM/100ML-% IV SOLN
INTRAVENOUS | Status: AC
  Filled 2016-10-02: qty 100

## 2016-10-02 MED ORDER — CEFAZOLIN IN D5W 1 GM/50ML IV SOLN
1.0000 g | Freq: Four times a day (QID) | INTRAVENOUS | Status: AC
  Administered 2016-10-02 – 2016-10-03 (×3): 1 g via INTRAVENOUS
  Filled 2016-10-02 (×3): qty 50

## 2016-10-02 MED ORDER — ACETAMINOPHEN 650 MG RE SUPP: 650.0000 mg | Freq: Four times a day (QID) | RECTAL | Status: DC | PRN

## 2016-10-02 MED ORDER — ASPIRIN EC 325 MG PO TBEC
325.0000 mg | DELAYED_RELEASE_TABLET | Freq: Every day | ORAL | Status: DC
  Administered 2016-10-03 – 2016-10-05 (×3): 325 mg via ORAL
  Filled 2016-10-02 (×3): qty 1

## 2016-10-02 MED ORDER — MIDAZOLAM HCL 5 MG/5ML IJ SOLN
INTRAMUSCULAR | Status: DC | PRN
  Administered 2016-10-02: 2 mg via INTRAVENOUS

## 2016-10-02 MED ORDER — METOCLOPRAMIDE HCL 5 MG PO TABS: 5.0000 mg | ORAL_TABLET | Freq: Three times a day (TID) | ORAL | Status: DC | PRN

## 2016-10-02 MED ORDER — ALBUTEROL SULFATE (2.5 MG/3ML) 0.083% IN NEBU
3.0000 mL | INHALATION_SOLUTION | RESPIRATORY_TRACT | Status: DC | PRN
Start: 2016-10-02 — End: 2016-10-05

## 2016-10-02 MED ORDER — ROCURONIUM BROMIDE 10 MG/ML (PF) SYRINGE
PREFILLED_SYRINGE | INTRAVENOUS | Status: DC | PRN
  Administered 2016-10-02: 50 mg via INTRAVENOUS

## 2016-10-02 MED ORDER — DIVALPROEX SODIUM ER 500 MG PO TB24
1000.0000 mg | ORAL_TABLET | Freq: Every day | ORAL | Status: DC
  Administered 2016-10-03 – 2016-10-05 (×3): 1000 mg via ORAL
  Filled 2016-10-02 (×3): qty 2

## 2016-10-02 MED ORDER — MAGNESIUM CITRATE PO SOLN: 1.0000 | Freq: Once | ORAL | Status: DC | PRN

## 2016-10-02 MED ORDER — BUPIVACAINE HCL (PF) 0.25 % IJ SOLN
INTRAMUSCULAR | Status: DC | PRN
  Administered 2016-10-02: 20 mL

## 2016-10-02 MED ORDER — SUGAMMADEX SODIUM 200 MG/2ML IV SOLN
INTRAVENOUS | Status: DC | PRN
  Administered 2016-10-02: 200 mg via INTRAVENOUS

## 2016-10-02 MED ORDER — SUGAMMADEX SODIUM 200 MG/2ML IV SOLN
INTRAVENOUS | Status: AC
  Filled 2016-10-02: qty 2

## 2016-10-02 MED ORDER — LACOSAMIDE 50 MG PO TABS
100.0000 mg | ORAL_TABLET | Freq: Two times a day (BID) | ORAL | Status: DC
  Administered 2016-10-02 – 2016-10-05 (×6): 100 mg via ORAL
  Filled 2016-10-02 (×6): qty 2

## 2016-10-02 MED ORDER — METOCLOPRAMIDE HCL 5 MG/ML IJ SOLN: 5.0000 mg | Freq: Three times a day (TID) | INTRAMUSCULAR | Status: DC | PRN

## 2016-10-02 MED ORDER — METHOCARBAMOL 1000 MG/10ML IJ SOLN: 500.0000 mg | Freq: Four times a day (QID) | INTRAVENOUS | Status: DC | PRN

## 2016-10-02 MED ORDER — 0.9 % SODIUM CHLORIDE (POUR BTL) OPTIME
TOPICAL | Status: DC | PRN
  Administered 2016-10-02: 1000 mL

## 2016-10-02 MED ORDER — ONDANSETRON HCL 4 MG/2ML IJ SOLN: 4.0000 mg | Freq: Four times a day (QID) | INTRAMUSCULAR | Status: DC | PRN

## 2016-10-02 MED ORDER — FENTANYL CITRATE (PF) 100 MCG/2ML IJ SOLN
INTRAMUSCULAR | Status: DC | PRN
  Administered 2016-10-02: 100 ug via INTRAVENOUS
  Administered 2016-10-02 (×2): 50 ug via INTRAVENOUS

## 2016-10-02 MED ORDER — ALPRAZOLAM 0.5 MG PO TABS
1.0000 mg | ORAL_TABLET | Freq: Three times a day (TID) | ORAL | Status: DC | PRN
  Administered 2016-10-02 – 2016-10-05 (×6): 1 mg via ORAL
  Filled 2016-10-02 (×6): qty 2

## 2016-10-02 MED ORDER — DOCUSATE SODIUM 100 MG PO CAPS
100.0000 mg | ORAL_CAPSULE | Freq: Two times a day (BID) | ORAL | Status: DC
  Administered 2016-10-02 – 2016-10-05 (×7): 100 mg via ORAL
  Filled 2016-10-02 (×7): qty 1

## 2016-10-02 MED ORDER — KETOROLAC TROMETHAMINE 15 MG/ML IJ SOLN
15.0000 mg | Freq: Four times a day (QID) | INTRAMUSCULAR | Status: AC
  Administered 2016-10-02 – 2016-10-03 (×4): 15 mg via INTRAVENOUS
  Filled 2016-10-02 (×4): qty 1

## 2016-10-02 MED ORDER — MOMETASONE FURO-FORMOTEROL FUM 200-5 MCG/ACT IN AERO
2.0000 | INHALATION_SPRAY | Freq: Two times a day (BID) | RESPIRATORY_TRACT | Status: DC
  Administered 2016-10-02 – 2016-10-04 (×5): 2 via RESPIRATORY_TRACT
  Filled 2016-10-02: qty 8.8

## 2016-10-02 MED ORDER — CEFAZOLIN SODIUM-DEXTROSE 2-4 GM/100ML-% IV SOLN
2.0000 g | INTRAVENOUS | Status: AC
  Administered 2016-10-02: 2 g via INTRAVENOUS

## 2016-10-02 MED ORDER — HYDROMORPHONE HCL 2 MG/ML IJ SOLN
1.0000 mg | INTRAMUSCULAR | Status: DC | PRN
  Administered 2016-10-03 – 2016-10-04 (×5): 1 mg via INTRAVENOUS
  Filled 2016-10-02 (×5): qty 1

## 2016-10-02 MED ORDER — LIDOCAINE 2% (20 MG/ML) 5 ML SYRINGE
INTRAMUSCULAR | Status: AC
  Filled 2016-10-02: qty 5

## 2016-10-02 SURGICAL SUPPLY — 52 items
BLADE CUDA 5.5 (BLADE) IMPLANT
BLADE GREAT WHITE 4.2 (BLADE) IMPLANT
BLADE SURG 15 STRL LF DISP TIS (BLADE) IMPLANT
BLADE SURG 15 STRL SS (BLADE) ×4
BNDG COHESIVE 4X5 TAN STRL (GAUZE/BANDAGES/DRESSINGS) ×1 IMPLANT
BNDG COHESIVE 6X5 TAN STRL LF (GAUZE/BANDAGES/DRESSINGS) ×2 IMPLANT
BNDG GAUZE ELAST 4 BULKY (GAUZE/BANDAGES/DRESSINGS) ×1 IMPLANT
BUR OVAL 6.0 (BURR) IMPLANT
COVER SURGICAL LIGHT HANDLE (MISCELLANEOUS) ×4 IMPLANT
CUFF TOURNIQUET SINGLE 34IN LL (TOURNIQUET CUFF) IMPLANT
CUFF TOURNIQUET SINGLE 44IN (TOURNIQUET CUFF) IMPLANT
DECANTER SPIKE VIAL GLASS SM (MISCELLANEOUS) ×1 IMPLANT
DRAPE ARTHROSCOPY W/POUCH 114 (DRAPES) ×2 IMPLANT
DRAPE C-ARM MINI 42X72 WSTRAPS (DRAPES) IMPLANT
DRAPE OEC MINIVIEW 54X84 (DRAPES) ×1 IMPLANT
DRAPE U-SHAPE 47X51 STRL (DRAPES) ×2 IMPLANT
DRSG EMULSION OIL 3X3 NADH (GAUZE/BANDAGES/DRESSINGS) ×2 IMPLANT
DRSG PAD ABDOMINAL 8X10 ST (GAUZE/BANDAGES/DRESSINGS) ×2 IMPLANT
DURAPREP 26ML APPLICATOR (WOUND CARE) ×2 IMPLANT
GAUZE SPONGE 4X4 12PLY STRL (GAUZE/BANDAGES/DRESSINGS) ×2 IMPLANT
GLOVE BIOGEL PI IND STRL 9 (GLOVE) ×1 IMPLANT
GLOVE BIOGEL PI INDICATOR 9 (GLOVE) ×1
GLOVE SURG ORTHO 9.0 STRL STRW (GLOVE) ×2 IMPLANT
GOWN STRL REUS W/ TWL XL LVL3 (GOWN DISPOSABLE) ×3 IMPLANT
GOWN STRL REUS W/TWL XL LVL3 (GOWN DISPOSABLE) ×6
GUIDEWIRE THREADED 2.8 (WIRE) ×2 IMPLANT
KIT BASIN OR (CUSTOM PROCEDURE TRAY) ×2 IMPLANT
KIT ROOM TURNOVER OR (KITS) ×2 IMPLANT
MANIFOLD NEPTUNE II (INSTRUMENTS) ×2 IMPLANT
NDL 18GX1X1/2 (RX/OR ONLY) (NEEDLE) ×1 IMPLANT
NEEDLE 18GX1X1/2 (RX/OR ONLY) (NEEDLE) ×2 IMPLANT
PACK ARTHROSCOPY DSU (CUSTOM PROCEDURE TRAY) ×2 IMPLANT
PAD ARMBOARD 7.5X6 YLW CONV (MISCELLANEOUS) ×4 IMPLANT
PADDING CAST COTTON 6X4 STRL (CAST SUPPLIES) ×2 IMPLANT
PENCIL BUTTON HOLSTER BLD 10FT (ELECTRODE) ×2 IMPLANT
SCREW CANN COMP 6.5X85 16MM (Screw) ×1 IMPLANT
SCREW CANN COMP 6.5X90 16MM (Screw) ×1 IMPLANT
SET ARTHROSCOPY TUBING (MISCELLANEOUS) ×2
SET ARTHROSCOPY TUBING LN (MISCELLANEOUS) ×1 IMPLANT
SPONGE GAUZE 4X4 12PLY STER LF (GAUZE/BANDAGES/DRESSINGS) ×1 IMPLANT
SPONGE LAP 4X18 X RAY DECT (DISPOSABLE) ×2 IMPLANT
SUCTION FRAZIER HANDLE 10FR (MISCELLANEOUS)
SUCTION TUBE FRAZIER 10FR DISP (MISCELLANEOUS) IMPLANT
SUT ETHILON 2 0 PSLX (SUTURE) ×2 IMPLANT
SYR 20CC LL (SYRINGE) ×2 IMPLANT
TAPE STRIPS DRAPE STRL (GAUZE/BANDAGES/DRESSINGS) IMPLANT
TOWEL OR 17X24 6PK STRL BLUE (TOWEL DISPOSABLE) ×2 IMPLANT
TOWEL OR 17X26 10 PK STRL BLUE (TOWEL DISPOSABLE) ×2 IMPLANT
WAND HAND CNTRL MULTIVAC 90 (MISCELLANEOUS) IMPLANT
WAND STAR VAC 90 (SURGICAL WAND) ×1 IMPLANT
WATER STERILE IRR 1000ML POUR (IV SOLUTION) ×2 IMPLANT
WRAP KNEE MAXI GEL POST OP (GAUZE/BANDAGES/DRESSINGS) ×2 IMPLANT

## 2016-10-02 NOTE — Anesthesia Procedure Notes (Signed)
Procedure Name: Intubation Date/Time: 10/02/2016 10:09 AM Performed by: Myna Bright Pre-anesthesia Checklist: Patient identified, Emergency Drugs available, Suction available and Patient being monitored Patient Re-evaluated:Patient Re-evaluated prior to inductionOxygen Delivery Method: Circle system utilized Preoxygenation: Pre-oxygenation with 100% oxygen Intubation Type: IV induction Ventilation: Mask ventilation without difficulty and Oral airway inserted - appropriate to patient size Laryngoscope Size: Mac and 4 Grade View: Grade I Tube type: Oral Tube size: 7.5 mm Number of attempts: 1 Airway Equipment and Method: Stylet Placement Confirmation: ETT inserted through vocal cords under direct vision,  positive ETCO2 and breath sounds checked- equal and bilateral Secured at: 22 cm Tube secured with: Tape Dental Injury: Teeth and Oropharynx as per pre-operative assessment

## 2016-10-02 NOTE — Transfer of Care (Signed)
Immediate Anesthesia Transfer of Care Note  Patient: Harold Bailey  Procedure(s) Performed: Procedure(s): Left Posterior Subtalar Arthrodesis Arthroscopy (Left)  Patient Location: PACU  Anesthesia Type:General  Level of Consciousness: awake, alert , oriented and patient cooperative  Airway & Oxygen Therapy: Patient Spontanous Breathing and Patient connected to nasal cannula oxygen  Post-op Assessment: Report given to RN and Post -op Vital signs reviewed and stable  Post vital signs: Reviewed and stable  Last Vitals:  Vitals:   10/02/16 0909  BP: 122/90  Pulse: 87  Resp: 20  Temp: 36.5 C    Last Pain:  Vitals:   10/02/16 0934  TempSrc:   PainSc: 8       Patients Stated Pain Goal: 3 ([id number])  Complications: No apparent anesthesia complications

## 2016-10-02 NOTE — Anesthesia Preprocedure Evaluation (Addendum)
Anesthesia Evaluation  Patient identified by MRN, date of birth, ID band Patient awake    Reviewed: Allergy & Precautions, H&P , NPO status , Patient's Chart, lab work & pertinent test results  Airway Mallampati: III  TM Distance: >3 FB Neck ROM: Full    Dental no notable dental hx. (+) Upper Dentures, Lower Dentures, Dental Advisory Given   Pulmonary COPD,  COPD inhaler, Current Smoker,    Pulmonary exam normal breath sounds clear to auscultation       Cardiovascular hypertension,  Rhythm:Regular Rate:Normal     Neuro/Psych  Headaches, Seizures -, Well Controlled,  Anxiety negative psych ROS   GI/Hepatic Neg liver ROS, GERD  Medicated and Controlled,  Endo/Other  negative endocrine ROS  Renal/GU negative Renal ROS  negative genitourinary   Musculoskeletal  (+) Arthritis , Osteoarthritis,    Abdominal   Peds  Hematology negative hematology ROS (+)   Anesthesia Other Findings   Reproductive/Obstetrics negative OB ROS                            Anesthesia Physical Anesthesia Plan  ASA: III  Anesthesia Plan: General   Post-op Pain Management:    Induction: Intravenous  Airway Management Planned: LMA  Additional Equipment:   Intra-op Plan:   Post-operative Plan: Extubation in OR  Informed Consent: I have reviewed the patients History and Physical, chart, labs and discussed the procedure including the risks, benefits and alternatives for the proposed anesthesia with the patient or authorized representative who has indicated his/her understanding and acceptance.   Dental advisory given  Plan Discussed with: CRNA  Anesthesia Plan Comments:         Anesthesia Quick Evaluation

## 2016-10-02 NOTE — Op Note (Signed)
10/02/2016  11:01 AM  PATIENT:  Harold Bailey    PRE-OPERATIVE DIAGNOSIS:  Subtalar Arthrosis Left Ankle  POST-OPERATIVE DIAGNOSIS:  Same  PROCEDURE:  Left Posterior Subtalar Arthrodesis Arthroscopy  SURGEON:  [name], MD  PHYSICIAN ASSISTANT:None ANESTHESIA:   General  PREOPERATIVE INDICATIONS:  [name] is a  51 y.o. male with a diagnosis of Subtalar Arthrosis Left Ankle who failed conservative measures and elected for surgical management.    The risks benefits and alternatives were discussed with the patient preoperatively including but not limited to the risks of infection, bleeding, nerve injury, cardiopulmonary complications, the need for revision surgery, among others, and the patient was willing to proceed.  OPERATIVE IMPLANTS: 6.5 Synthes screws 2  OPERATIVE FINDINGS: Large posterior osteophytic bone spurs  OPERATIVE PROCEDURE: Patient brought the operating room and underwent a general anesthetic. After adequate levels anesthesia obtained patient was placed in the prone position the left lower extremity was prepped using DuraPrep draped into a sterile field a timeout was called. He can discuss with inserted through the posterior medial portal and a posterior lateral portal was used for a working portal. Visualization showed significant amount of bursitis. Using the vapor wand the bursa inflammatory tissue was debrided with both the wand and the shaver. Patient had large osteophytic bone spurs posteriorly and the acromionizer was used to debride the bone spurs and then the acromionizer was used to further debride the posterior facet. A ring curet was also used as well as the shaver. The posterior facet was debrided of all articular cartilage back to bleeding viable subchondral bone. The patient was placed in neutral dorsiflexion the ankle neutral varus and valgus of the hindfoot and 2 guidewires inserted from the calcaneus into the talus to stabilize the  posterior facet. 26.5 headless cannulated screws were used to compress the fusion site. C-arm floss be verified alignment. The wounds were irrigated with normal saline incisions were closed using 2-0 nylon a sterile compressive dressing was applied patient was extubated taken the PACU in stable condition.

## 2016-10-02 NOTE — H&P (Addendum)
Harold Bailey is an 51 y.o. male.   Chief Complaint: Painful fibrous subtalar arthrosis left foot HPI: Patient is status post ankle fusion on the left. He has a stable ankle fusion. Patient presents at this time for subtalar fusion with posterior arthroscopic subtalar arthrodesis. Due to the incisions medially and laterally patient does not have a good soft tissue envelope to proceed with fusion through the normal sinus Tarsi approach.  Past Medical History:  Diagnosis Date  . Anxiety   . Arthritis    Left ankle  . COPD (chronic obstructive pulmonary disease) ([place])   . Drug abuse   . Gait disturbance   . GERD (gastroesophageal reflux disease)   . HA (headache)   . 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  Marland Kitchen Hyperlipidemia   . Hypertension   . 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 Memory loss   . Peripheral vision loss    Left due to brain surgery  . Pneumonia   . Polycythemia   . Prediabetes   . Seizures ([place]) 11/19/2013   none since 2016  . Shortness of breath    Hx of smoking.    Past Surgical History:  Procedure Laterality Date  . ANKLE ARTHROSCOPY  01/17/2012   Procedure: ANKLE ARTHROSCOPY;  Surgeon: [name], MD;  Location: [facility];  Service: Orthopedics;  Laterality: Left;  . ANKLE FUSION Left 11/2012   Dr Sharol Given  . ANKLE SURGERY  2003   Left  . BRAIN SURGERY  2009   Biopsy  . FRACTURE SURGERY  2003   Left ankle  . HARDWARE REMOVAL Left 12/08/2012   Procedure: HARDWARE REMOVAL;  Surgeon: [name], MD;  Location: [facility];  Service: Orthopedics;  Laterality: Left;  Removal Deep Hardware, Take Down Non-Union, Revision Internal Fixation Left Ankle  . I&D EXTREMITY Left 01/01/2013   Procedure: IRRIGATION AND DEBRIDEMENT EXTREMITY;  Surgeon: [name], MD;  Location: [facility];  Service: Orthopedics;  Laterality: Left;  Irrigation, Debridement and Placement Antibiotic Beads Left Ankle  . INNER  EAR SURGERY    . OPEN REDUCTION INTERNAL FIXATION (ORIF) SCAPHOID WITH DISTAL RADIUS GRAFT Right 02/01/2013   Procedure: OPEN REDUCTION INTERNAL FIXATION (ORIF) RIGHT SCAPHOID FRACTURE WITH DISTAL RADIUS GRAFT;  Surgeon: Schuyler Amor, MD;  Location: [facility];  Service: Orthopedics;  Laterality: Right;  . ORIF ANKLE FRACTURE Left 12/08/2012   Procedure: OPEN REDUCTION INTERNAL FIXATION (ORIF) ANKLE FRACTURE;  Surgeon: [name], MD;  Location: [facility];  Service: Orthopedics;  Laterality: Left;  Removal Deep Hardware, Take Down Non-Union, Revision Internal Fixation Left Ankle  . ORIF ANKLE FRACTURE Right 09/03/2013   Procedure: OPEN REDUCTION INTERNAL FIXATION (ORIF) ANKLE FRACTURE;  Surgeon: [name], MD;  Location: [facility];  Service: Orthopedics;  Laterality: Right;  Open Reduction Internal Fixation Right Fibula  . SCAPHOID FRACTURE SURGERY  02/04/2013  . SHOULDER ARTHROSCOPY  2008   Left  . TYMPANOSTOMY TUBE PLACEMENT  1993    Family History  Problem Relation Age of Onset  . Hypertension Mother   . Bronchitis Mother    Social History:  reports that he has been smoking Cigarettes.  He has a 7.50 pack-year smoking history. He has never used smokeless tobacco. He reports that he does not drink alcohol or use drugs.  Allergies:  Allergies  Allergen Reactions  . No Known Allergies     No  prescriptions prior to admission.    No results found for this or any previous visit (from the past 48 hour(s)). No results found.  Review of Systems  All other systems reviewed and are negative.   There were no vitals taken for this visit. Physical Exam  Examination patient is alert oriented no adenopathy well-dressed normal affect breast where. He has pain with attempted subtalar motion. Radiographs shows Osteophytic bone spurs with subtalar arthritis. There is no redness no cellulitis no signs of infection. Previous surgical incisions are well-healed. Patient has a good dorsalis pedis  pulse. Assessment/Plan Assessment: Subtalar arthrosis with stable ankle fusion left foot.  Plan: We'll plan for posterior arthroscopic subtalar arthrodesis. Risk and benefits were discussed including persistent pain. Patient states he understands wish to proceed at this time. Patient also complains of some anterior medial ankle pain however radiographs shows a stable fusion with no complicating features. This area of swelling may be swelling coming up from the posterior facet arthrosis.  [name], MD 10/02/2016, 7:21 AM

## 2016-10-02 NOTE — Progress Notes (Signed)
Orthopedic Tech Progress Note Patient Details:  Harold DOBBYN March 14, 1966 [id number]  Ortho Devices Type of Ortho Device: CAM walker Ortho Device/Splint Location: lle Ortho Device/Splint Interventions: Application   Zyir Gassert 10/02/2016, 12:01 PM

## 2016-10-02 NOTE — Anesthesia Postprocedure Evaluation (Signed)
Anesthesia Post Note  Patient: Harold Bailey  Procedure(s) Performed: Procedure(s) (LRB): Left Posterior Subtalar Arthrodesis Arthroscopy (Left)  Patient location during evaluation: PACU Anesthesia Type: General Level of consciousness: awake and alert Pain management: pain level controlled Vital Signs Assessment: post-procedure vital signs reviewed and stable Respiratory status: spontaneous breathing, nonlabored ventilation, respiratory function stable and patient connected to nasal cannula oxygen Cardiovascular status: blood pressure returned to baseline and stable Postop Assessment: no signs of nausea or vomiting Anesthetic complications: no       Last Vitals:  Vitals:   10/02/16 1156 10/02/16 1200  BP:  (!) 145/96  Pulse: 97 94  Resp: 16 14  Temp:  36.7 C    Last Pain:  Vitals:   10/02/16 1200  TempSrc:   PainSc: 7                  Reana Chacko,W. EDMOND

## 2016-10-03 NOTE — NC FL2 (Signed)
[place] LEVEL OF CARE SCREENING TOOL     IDENTIFICATION  Patient Name: Harold Bailey Birthdate: Nov 21, 1965 Sex: male Admission Date (Current Location): 10/02/2016  [facility] and [place] Number:  Herbalist and Address:  [facility], [place] [address] [place], [place] [address]      Provider Number: [id number]  Attending Physician Name and Address:  [name], MD  Relative Name and Phone Number:       Current Level of Care: Hospital Recommended Level of Care: [place] Prior Approval Number:    Date Approved/Denied: 01/19/12 PASRR Number:  [id number]   Discharge Plan: SNF    Current Diagnoses: Patient Active Problem List   Diagnosis Date Noted  . Subtalar joint instability, left   . 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 COPD with exacerbation ([place]) 07/02/2016  . Alcohol intoxication ([place]) 07/02/2016  . COPD exacerbation ([place]) 07/02/2016  . Tobacco dependence 06/06/2015  . Medication overuse headache 02/28/2015  . Localization-related symptomatic epilepsy and epileptic syndromes with complex partial seizures, not intractable, without status epilepticus ([place]) 02/28/2015  . Cognitive impairment 02/28/2015  . 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 Tobacco abuse 02/28/2015  . Brain tumor ([place]) 11/19/2013  . Seizures ([place]) 11/19/2013  . Memory loss   . HA (headache)   . Gait disturbance   . Pain in joint, ankle and foot 10/15/2013  . Falls frequently 10/15/2013  . Swelling 09/09/2013  . Rash and nonspecific skin eruption 09/09/2013  . Ankle fracture, lateral malleolus, closed 09/03/2013  . Unspecified vitamin D deficiency 08/12/2013  . Polycythemia   . Memory deficit   . Drug abuse   . GERD (gastroesophageal reflux disease)   . Anxiety   . Arthritis   . Hyperlipidemia   . Prediabetes     Orientation RESPIRATION BLADDER Height & Weight     Self, Time, Situation, Place  O2 (Nasal  Cannula; 2L) Continent Weight: 220 lb (99.8 kg) Height:  6' (182.9 cm)  BEHAVIORAL SYMPTOMS/MOOD NEUROLOGICAL BOWEL NUTRITION STATUS      Continent  (Please see discharge summary)  AMBULATORY STATUS COMMUNICATION OF NEEDS Skin   Limited Assist Verbally Surgical wounds (Closed incision left ankle; compression wrap.)                       Personal Care Assistance Level of Assistance  Bathing, Feeding, Dressing Bathing Assistance: Limited assistance Feeding assistance: Independent Dressing Assistance: Limited assistance     Functional Limitations Info  Sight, Hearing, Speech Sight Info: Adequate Hearing Info: Adequate Speech Info: Adequate    SPECIAL CARE FACTORS FREQUENCY  PT (By licensed PT), OT (By licensed OT)     PT Frequency: min 3x week OT Frequency:  min 3x week            Contractures Contractures Info: Not present    Additional Factors Info  Code Status, Allergies Code Status Info: Full Allergies Info: No known allergies           Current Medications (10/03/2016):  This is the current hospital active medication list Current Facility-Administered Medications  Medication Dose Route Frequency Provider Last Rate Last Dose  . 0.9 %  sodium chloride infusion   Intravenous Continuous [name], MD 10 mL/hr at 10/02/16 1430    . acetaminophen (TYLENOL) tablet 650 mg  650 mg Oral Q6H PRN [name], MD   650 mg at 10/02/16 1612   Or  .  acetaminophen (TYLENOL) suppository 650 mg  650 mg Rectal Q6H PRN [name], MD      . albuterol (PROVENTIL) (2.5 MG/3ML) 0.083% nebulizer solution 3 mL  3 mL Inhalation Q4H PRN [name], MD      . ALPRAZolam Duanne Moron) tablet 1 mg  1 mg Oral TID PRN [name], MD   1 mg at 10/03/16 [id number]  . aspirin EC tablet 325 mg  325 mg Oral Daily [name], MD   325 mg at 10/03/16 [id number]  . bisacodyl (DULCOLAX) suppository 10 mg  10 mg Rectal Daily PRN [name], MD      . divalproex (DEPAKOTE ER) 24 hr tablet 1,000 mg   1,000 mg Oral Daily [name], MD   1,000 mg at 10/03/16 0927  . docusate sodium (COLACE) capsule 100 mg  100 mg Oral BID [name], MD   100 mg at 10/03/16 [id number]  . gabapentin (NEURONTIN) capsule 300 mg  300 mg Oral TID [name], MD   300 mg at 10/03/16 0927  . HYDROmorphone (DILAUDID) injection 1 mg  1 mg Intravenous Q2H PRN [name], MD   1 mg at 10/03/16 1434  . lacosamide (VIMPAT) tablet 100 mg  100 mg Oral BID [name], MD   100 mg at 10/03/16 [id number]  . magnesium citrate solution 1 Bottle  1 Bottle Oral Once PRN [name], MD      . methocarbamol (ROBAXIN) tablet 500 mg  500 mg Oral Q6H PRN [name], MD   500 mg at 10/03/16 [id number]   Or  . methocarbamol (ROBAXIN) 500 mg in dextrose 5 % 50 mL IVPB  500 mg Intravenous Q6H PRN [name], MD      . metoCLOPramide (REGLAN) tablet 5-10 mg  5-10 mg Oral Q8H PRN [name], MD       Or  . metoCLOPramide (REGLAN) injection 5-10 mg  5-10 mg Intravenous Q8H PRN [name], MD      . mometasone-formoterol [facility]) 200-5 MCG/ACT inhaler 2 puff  2 puff Inhalation BID [name], MD   2 puff at 10/03/16 0859  . ondansetron (ZOFRAN) tablet 4 mg  4 mg Oral Q6H PRN [name], MD       Or  . ondansetron [facility]) injection 4 mg  4 mg Intravenous Q6H PRN [name], MD      . oxyCODONE (Oxy IR/ROXICODONE) immediate release tablet 5-10 mg  5-10 mg Oral Q3H PRN [name], MD   10 mg at 10/03/16 1234  . polyethylene glycol (MIRALAX / GLYCOLAX) packet 17 g  17 g Oral Daily PRN [name], MD         Discharge Medications: Please see discharge summary for a list of discharge medications.  Relevant Imaging Results:  Relevant Lab Results:   Additional Information SSN: [id number]  Ht: 6' (1.829 m) Wt: 220 lb (99.8 kg)  Winslow Ederer A [place], LCSW

## 2016-10-03 NOTE — Evaluation (Signed)
Physical Therapy Evaluation Patient Details Name: Harold Bailey MRN: [id number] DOB: Jan 22, 1966 Today's Date: 10/03/2016   History of Present Illness  51 yo male admitted 10/02/16 for subtalar arthrodesis via arthroscopy. PMH significant for anxiety, COPD, GERD, HTn, HLD, Siezure, ankle fusion.   Clinical Impression  Pt is POD 1 following the above procedure. Prior to admission, pt was independent without an assistive device but is a high risk for falls and has had multiple falls over the past three months. Pt will benefit from SNF placement upon discharge while pt is NWB in order to maximize his functional outcomes. Pt will benefit from continuing to be seen acutely in order to address below deficits and assist with return to PLOF.     Follow Up Recommendations SNF    Equipment Recommendations  None recommended by PT    Recommendations for Other Services       Precautions / Restrictions Precautions Precautions: Fall Precaution Comments: pt is high risk for falls. Most recent fall s/p 2 months Required Braces or Orthoses: Other Brace/Splint Other Brace/Splint: cam walker LLE Restrictions Weight Bearing Restrictions: Yes LLE Weight Bearing: Non weight bearing      Mobility  Bed Mobility Overal bed mobility: Modified Independent             General bed mobility comments: Mod I to EOB  Transfers Overall transfer level: Needs assistance Equipment used: Rolling walker (2 wheeled) Transfers: Sit to/from Stand Sit to Stand: Min guard         General transfer comment: Min gaurd for safety due to balance deficits and NWB status  Ambulation/Gait Ambulation/Gait assistance: Min guard Ambulation Distance (Feet): 15 Feet Assistive device: Rolling walker (2 wheeled) Gait Pattern/deviations: Step-to pattern Gait velocity: decreased Gait velocity interpretation: Below normal speed for age/gender General Gait Details: Good sequencing and adherence to Weight bearing  restrictions  Stairs            Wheelchair Mobility    Modified Rankin (Stroke Patients Only)       Balance Overall balance assessment: History of Falls                                           Pertinent Vitals/Pain Pain Assessment: 0-10 Pain Score: 9  Pain Location: left ankle    Home Living Family/patient expects to be discharged to:: Skilled nursing facility                 Additional Comments: lived in mobile home and had stairs to get in and out    Prior Function Level of Independence: Independent         Comments: occsionally used a cane     Hand Dominance   Dominant Hand: Left    Extremity/Trunk Assessment   Upper Extremity Assessment Upper Extremity Assessment: Overall WFL for tasks assessed    Lower Extremity Assessment Lower Extremity Assessment: LLE deficits/detail LLE Deficits / Details: overall 3/5 knee and hip. unable to assess ankle LLE: Unable to fully assess due to immobilization    Cervical / Trunk Assessment Cervical / Trunk Assessment: Normal  Communication   Communication: No difficulties  Cognition Arousal/Alertness: Awake/alert Behavior During Therapy: WFL for tasks assessed/performed Overall Cognitive Status: Within Functional Limits for tasks assessed                      General Comments  Exercises     Assessment/Plan    PT Assessment Patient needs continued PT services  PT Problem List Decreased strength;Decreased range of motion;Decreased balance;Decreased activity tolerance;Decreased mobility;Decreased knowledge of use of DME;Pain          PT Treatment Interventions DME instruction;Gait training;Functional mobility training;Therapeutic activities;Therapeutic exercise;Balance training    PT Goals (Current goals can be found in the Care Plan section)  Acute Rehab PT Goals Patient Stated Goal: to be able to walk again PT Goal Formulation: With patient Time For Goal  Achievement: 10/10/16 Potential to Achieve Goals: Good    Frequency Min 3X/week   Barriers to discharge        Co-evaluation               End of Session Equipment Utilized During Treatment: Gait belt;Other (comment) (Cam walker L) Activity Tolerance: Patient tolerated treatment well Patient left: in chair;with call bell/phone within reach Nurse Communication: Mobility status         Time: [id number] PT Time Calculation (min) (ACUTE ONLY): 30 min   Charges:   PT Evaluation $PT Eval Moderate Complexity: 1 Procedure PT Treatments $Gait Training: 8-22 mins   PT G Codes:        Scheryl Marten PT, DPT  [phone]  10/03/2016, 1:05 PM

## 2016-10-03 NOTE — Clinical Social Work Note (Signed)
Clinical Social Work Assessment  Patient Details  Name: Harold Bailey MRN: [id number] Date of Birth: February 21, 1966  Date of referral:  10/03/16               Reason for consult:  Facility Placement                Permission sought to share information with:    Permission granted to share information::     Name::        Agency::     Relationship::     Contact Information:     Housing/Transportation Living arrangements for the past 2 months:  Single Family Home Source of Information:  Patient Patient Interpreter Needed:  None Criminal Activity/Legal Involvement Pertinent to Current Situation/Hospitalization:  No - Comment as needed Significant Relationships:  Friend Lives with:  Self Do you feel safe going back to the place where you live?  Yes Need for family participation in patient care:     Care giving concerns:  No family or friends at bedside during initial assessment. Pt reports he does have very many friends. Pt does not report on any family in the area however, in the chart there is a spouse listed. CSW will inquire about this number next interaction with pt.    Social Worker assessment / plan:  CSW spoke with pt at bedside to complete initial assessment. Pt was in great spirits and humorous. Pt reports he lives alone in a trailer. Pt reports he has about five steps to get into the trailer. Pt reports that his friends from his "meetings" are building him a ramp. Pt reports he has been to [facility] before. Pt is agreeable to SNF placement at d/c. Pt reports he also has had someone recommend [facility] to him. 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 Pt reports he thought he was in [place]. CSW will send referral to [facility] per request of pt. CSW will follow up with pt.   Employment status:    Insurance information:  Medicare PT Recommendations:  [place] / Referral to community resources:  [place]  Patient/Family's Response to care:  Pt verbalized understanding of CSW role and expressed great appreciation for support. Pt denies any concern regarding pt care at this time.   Patient/Family's Understanding of and Emotional Response to Diagnosis, Current Treatment, and Prognosis:  Pt understanding and realistic regarding physical limitations. Pt reports he has short term memory loss because he has a brain tumor however, pt is alert and orient in conversation. Pt understands the need for SNF placement and is agreable at this time. Per Pt he has had SNF placement in the past [facility]).  Emotional Assessment Appearance:  Appears stated age Attitude/Demeanor/Rapport:   (Patient was appropriate.) Affect (typically observed):  Accepting, Appropriate, Happy, Hopeful Orientation:  Oriented to Self, Oriented to Place, Oriented to  Time, Oriented to Situation (Pt has short term memory loss.) Alcohol / Substance use:  Not Applicable Psych involvement (Current and /or in the community):  No (Comment)  Discharge Needs  Concerns to be addressed:  No discharge needs identified Readmission within the last 30 days:  No Current discharge risk:  Dependent with [facility] Barriers to Discharge:  Continued Medical Work up   [employer], LCSW 10/03/2016, 3:21 PM

## 2016-10-03 NOTE — Clinical Social Work Placement (Signed)
   CLINICAL SOCIAL WORK PLACEMENT  NOTE  Date:  10/03/2016  Patient Details  Name: Harold Bailey MRN: [id number] Date of Birth: 1966/09/04  Clinical Social Work is seeking post-discharge placement for this patient at the [place] level of care (*CSW will initial, date and re-position this form in  chart as items are completed):      Patient/family provided with [facility] Work Department's list of facilities offering this level of care within the geographic area requested by the patient (or if unable, by the patient's family).      Patient/family informed of their freedom to choose among providers that offer the needed level of care, that participate in Medicare, Medicaid or managed care program needed by the patient, have an available bed and are willing to accept the patient.      Patient/family informed of [facility]'s ownership interest in [facility] and [facility], as well as of the fact that they are under no obligation to receive care at these facilities.  PASRR submitted to EDS on       PASRR number received on       Existing PASRR number confirmed on 10/03/16     FL2 transmitted to all facilities in geographic area requested by pt/family on 10/03/16     FL2 transmitted to all facilities within larger geographic area on       Patient informed that his/her managed care company has contracts with or will negotiate with certain facilities, including the following:            Patient/family informed of bed offers received.  Patient chooses bed at       Physician recommends and patient chooses bed at      Patient to be transferred to   on  .  Patient to be transferred to facility by       Patient family notified on   of transfer.  Name of family member notified:        PHYSICIAN Please prepare priority discharge summary, including medications, Please prepare prescriptions, Please sign FL2     Additional Comment:     _______________________________________________ Alla German, LCSW 10/03/2016, 3:34 PM

## 2016-10-03 NOTE — Progress Notes (Signed)
Patient ID: Harold Bailey, male   DOB: Jan 27, 1966, 51 y.o.   MRN: [id number] Postoperative day 1 posterior arthroscopic subtalar arthrodesis on the left. Patient's dressing is clean and dry. Patient has had difficulty with balance in the past has fallen after previous surgeries. Patient will need discharge to skilled nursing facility until he is safe with ambulation.

## 2016-10-04 ENCOUNTER — Encounter (HOSPITAL_COMMUNITY): Payer: Self-pay | Admitting: Orthopedic Surgery

## 2016-10-04 MED ORDER — OXYCODONE-ACETAMINOPHEN 5-325 MG PO TABS: 1.0000 | ORAL_TABLET | ORAL | 0 refills | Status: DC | PRN

## 2016-10-04 NOTE — Progress Notes (Signed)
Physical Therapy Treatment Patient Details Name: Harold Bailey MRN: [id number] DOB: 1966/08/23 Today's Date: 10/04/2016    History of Present Illness 51 yo male admitted 10/02/16 for subtalar arthrodesis via arthroscopy. PMH significant for anxiety, COPD, GERD, HTn, HLD, Siezure, ankle fusion.     PT Comments    Pt found in the bathroom holding onto a rolling table for support to hop to the bathroom.  PT assisted him back to bed using the RW which he needed min assist to control. He does not consistently keep NWB on his left foot, but did have his CAM boot donned.  He is at high fall risk.  PT will continue to follow acutely and encourage safe mobility.    Follow Up Recommendations  SNF     Equipment Recommendations  None recommended by PT    Recommendations for Other Services   NA     Precautions / Restrictions Precautions Precautions: Fall Precaution Comments: pt is high risk for falls. Most recent fall s/p 2 months Required Braces or Orthoses: Other Brace/Splint Other Brace/Splint: cam walker LLE Restrictions Weight Bearing Restrictions: Yes LLE Weight Bearing: Non weight bearing    Mobility  Bed Mobility Overal bed mobility: Modified Independent             General bed mobility comments: Pt able to get in and out of hospital bed unassisted.   Transfers Overall transfer level: Needs assistance Equipment used: Rolling walker (2 wheeled) Transfers: Sit to/from Stand Sit to Stand: Supervision         General transfer comment: supervision for safety, verbal cues for NWB on his left foot as he puts pressure through it during transitions.    Ambulation/Gait Ambulation/Gait assistance: Min guard Ambulation Distance (Feet): 10 Feet Assistive device: Rolling walker (2 wheeled) Gait Pattern/deviations: Step-to pattern (hop to) Gait velocity: decreased   General Gait Details: Min assist to stabilize RW and help pt for balance.  Pt able to hop and maintaing NWB  during hopping, but does have some difficulty controlling the RW.           Balance Overall balance assessment: Needs assistance Sitting-balance support: Feet supported;No upper extremity supported Sitting balance-Leahy Scale: Good     Standing balance support: Bilateral upper extremity supported Standing balance-Leahy Scale: Poor                      Cognition Arousal/Alertness: Awake/alert Behavior During Therapy: WFL for tasks assessed/performed;Impulsive (mildly impulsive) Overall Cognitive Status: Within Functional Limits for tasks assessed                             Pertinent Vitals/Pain Pain Assessment: Faces Faces Pain Scale: Hurts little more Pain Location: left ankle           PT Goals (current goals can now be found in the care plan section) Acute Rehab PT Goals Patient Stated Goal: to be able to walk again Progress towards PT goals: Progressing toward goals    Frequency    Min 3X/week      PT Plan Current plan remains appropriate       End of Session Equipment Utilized During Treatment: Other (comment) (CAM boot) Activity Tolerance: Patient tolerated treatment well Patient left: in bed;with call bell/phone within reach;with bed alarm set     Time: 1200-1208 PT Time Calculation (min) (ACUTE ONLY): 8 min  Charges:  $Gait Training: 8-22 mins  Barbarann Ehlers Roshanda Balazs, PT, DPT [phone]   10/04/2016, 12:16 PM

## 2016-10-04 NOTE — Discharge Summary (Signed)
Discharge Diagnoses:  Active Problems:   Subtalar joint instability, left   Surgeries: Procedure(s): Left Posterior Subtalar Arthrodesis Arthroscopy on 10/02/2016    Consultants:  none  Discharged Condition: Improved  Hospital Course: Harold Bailey is an 51 y.o. 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  Patient was brought to the operating room on 10/02/2016 and underwent Procedure(s): Left Posterior Subtalar Arthrodesis Arthroscopy.    Patient was given perioperative antibiotics: Anti-infectives    Start     Dose/Rate Route Frequency Ordered Stop   10/02/16 1600  ceFAZolin (ANCEF) IVPB 1 g/50 mL premix     1 g 100 mL/hr over 30 Minutes Intravenous Every 6 hours 10/02/16 1408 10/03/16 0320   10/02/16 0930  ceFAZolin (ANCEF) IVPB 2g/100 mL premix     2 g 200 mL/hr over 30 Minutes Intravenous On call to O.R. 10/02/16 0914 10/02/16 1015   10/02/16 0921  ceFAZolin (ANCEF) 2-4 GM/100ML-% IVPB    Comments:  [name]   : cabinet override      10/02/16 0921 10/02/16 1015    .  Patient was given sequential compression devices, early ambulation, and aspirin for DVT prophylaxis.  Recent vital signs: Patient Vitals for the past 24 hrs:  BP Temp Temp src Pulse Resp SpO2  10/04/16 0456 126/83 98 F (36.7 C) Oral 98 18 95 %  10/03/16 1410 116/79 98.1 F (36.7 C) Oral 98 18 98 %  .  Recent laboratory studies: No results found.  Discharge Medications:   Allergies as of 10/04/2016      Reactions   No Known Allergies       Medication List    STOP taking these medications   diclofenac sodium 1 % Gel Commonly known as:  VOLTAREN   naproxen 500 MG tablet Commonly known as:  NAPROSYN     TAKE these medications   albuterol 108 (90 Base) MCG/ACT inhaler Commonly known as:  PROVENTIL HFA;VENTOLIN HFA Inhale 2 puffs into the lungs every 4 (four) hours as needed for wheezing or  shortness of breath (cough, shortness of breath or wheezing.).   ALPRAZolam 1 MG tablet Commonly known as:  XANAX Take 1 tablet (1 mg total) by mouth 3 (three) times daily as needed for anxiety.   divalproex 500 MG 24 hr tablet Commonly known as:  DEPAKOTE ER Take 2 tablets (1,000 mg total) by mouth daily. NO MORE REFILLS. MUST KEEP FEB. APPT   Fluticasone-Salmeterol 250-50 MCG/DOSE Aepb Commonly known as:  ADVAIR DISKUS Inhale 1 puff into the lungs 2 (two) times daily. What changed:  when to take this  reasons to take this   gabapentin 300 MG capsule Commonly known as:  NEURONTIN Take 300-600 mg by mouth 3 (three) times daily.   multivitamin with minerals Tabs tablet Take 1 tablet by mouth daily.   oxyCODONE-acetaminophen 5-325 MG tablet Commonly known as:  ROXICET Take 1 tablet by mouth every 4 (four) hours as needed for severe pain. What changed:  when to take this   VIMPAT 100 MG Tabs Generic drug:  Lacosamide TAKE 1 TABLET BY MOUTH TWICE DAILY       Diagnostic Studies: No results found.  Patient benefited maximally from their hospital stay and there were no complications.     Disposition: 01-Home or Self Care Discharge Instructions    Call MD / Call 911    Complete by:  As directed  If you experience chest pain or shortness of breath, CALL 911 and be transported to the hospital emergency room.  If you develope a fever above 101 F, pus (white drainage) or increased drainage or redness at the wound, or calf pain, call your surgeon's office.   Constipation Prevention    Complete by:  As directed    Drink plenty of fluids.  Prune juice may be helpful.  You may use a stool softener, such as Colace (over the counter) 100 mg twice a day.  Use MiraLax (over the counter) for constipation as needed.   Diet - low sodium heart healthy    Complete by:  As directed    Elevate operative extremity    Complete by:  As directed    Increase activity slowly as tolerated     Complete by:  As directed    Non weight bearing    Complete by:  As directed    Laterality:  left   Extremity:  Lower     Follow-up Information    [name], MD Follow up in 2 week(s).   Specialty:  Orthopedic Surgery Contact information: [place] [address] [phone]            Signed: [name] 10/04/2016, 6:42 AM

## 2016-10-05 NOTE — Progress Notes (Signed)
Subjective: Patient stable pain is controlled   Objective: Vital signs in last 24 hours: Temp:  [97.6 F (36.4 C)-98 F (36.7 C)] 98 F (36.7 C) (01/12 2100) Pulse Rate:  [88-100] 100 (01/12 2100) Resp:  [16-20] 20 (01/12 2100) BP: (113-135)/(78-82) 135/78 (01/12 2100) SpO2:  [98 %] 98 % (01/12 2100)  Intake/Output from previous day: 01/12 0701 - 01/13 0700 In: 830 [P.O.:830] Out: 950 [Urine:950] Intake/Output this shift: No intake/output data recorded.  Exam:  Compartment soft  Labs: No results for input(s): HGB in the last 72 hours. No results for input(s): WBC, RBC, HCT, PLT in the last 72 hours. No results for input(s): NA, K, CL, CO2, BUN, CREATININE, GLUCOSE, CALCIUM in the last 72 hours. No results for input(s): LABPT, INR in the last 72 hours.  Assessment/Plan: Plan discharge this morning   G Alphonzo Severance 10/05/2016, 9:27 AM

## 2016-10-05 NOTE — Clinical Social Work Note (Signed)
Clinical Social Worker facilitated patient discharge including contacting patient family and facility to confirm patient discharge plans.  Clinical information faxed to facility and family agreeable with plan.  CSW arranged ambulance transport via [facility] to [facility] .  RN to call [phone] for report prior to discharge.  Clinical Social Worker will sign off for now as social work intervention is no longer needed. Please consult [place] again if new need arises.  [address], [place]

## 2016-10-05 NOTE — Plan of Care (Signed)
Problem: Pain Managment: Goal: General experience of comfort will improve Medicated twice for pain with moderate relief  Problem: Skin Integrity: Goal: Risk for impaired skin integrity will decrease Outcome: Progressing Dressing clean, dry and intact to left foot  Problem: Tissue Perfusion: Goal: Risk factors for ineffective tissue perfusion will decrease Outcome: Progressing No S/S of DVT  Problem: Activity: Goal: Risk for activity intolerance will decrease Outcome: Progressing Independent with activity  Problem: Bowel/Gastric: Goal: Will not experience complications related to bowel motility Outcome: Progressing Denies gastric and bowel issues

## 2016-10-07 ENCOUNTER — Telehealth (INDEPENDENT_AMBULATORY_CARE_PROVIDER_SITE_OTHER): Payer: Self-pay | Admitting: Orthopedic Surgery

## 2016-10-07 NOTE — Telephone Encounter (Signed)
Marin Roberts (therapy Manager) with [facility] needing orders on weight bearing status for the patient. The number to contact her is [phone]

## 2016-10-07 NOTE — Telephone Encounter (Signed)
I called and left voicemail to advise that patient is to be nonweightbearing per MD, order placed on 10/04/16 in EPIC.

## 2016-10-08 ENCOUNTER — Non-Acute Institutional Stay (SKILLED_NURSING_FACILITY): Payer: Medicare Other | Admitting: Internal Medicine

## 2016-10-08 ENCOUNTER — Telehealth (INDEPENDENT_AMBULATORY_CARE_PROVIDER_SITE_OTHER): Payer: Self-pay | Admitting: Orthopedic Surgery

## 2016-10-08 ENCOUNTER — Encounter: Payer: Self-pay | Admitting: Internal Medicine

## 2016-10-08 DIAGNOSIS — Z87898 Personal history of other specified conditions: Secondary | ICD-10-CM | POA: Diagnosis not present

## 2016-10-08 DIAGNOSIS — M25375 Other instability, left foot: Secondary | ICD-10-CM | POA: Diagnosis not present

## 2016-10-08 DIAGNOSIS — F1011 Alcohol abuse, in remission: Secondary | ICD-10-CM

## 2016-10-08 DIAGNOSIS — R569 Unspecified convulsions: Secondary | ICD-10-CM | POA: Diagnosis not present

## 2016-10-08 DIAGNOSIS — Z9189 Other specified personal risk factors, not elsewhere classified: Secondary | ICD-10-CM | POA: Diagnosis not present

## 2016-10-08 NOTE — Assessment & Plan Note (Signed)
Sleep apnea evaluation deferred to PCP

## 2016-10-08 NOTE — Telephone Encounter (Signed)
I called pt and he states that he knew he would leave dressing intact but that he wanted to work on doing some mild ROM (abcs with foot) I advised the pt that the only thing Dr. Sharol Given said that he can NOT do is be weight bearing. Pt voiced understanding and will call with any questions.

## 2016-10-08 NOTE — Assessment & Plan Note (Addendum)
Follow-up Orthopedic appointment  10/16/16 @ 2 pm

## 2016-10-08 NOTE — Patient Instructions (Addendum)
See Current Assessment & Plan in Problem List under specific Diagnosis I explained his risk of over 80% of adverse reaction or possibly death if opioids are continued long-term. He was instructed the dose will be weaned every 5-7 days

## 2016-10-08 NOTE — Telephone Encounter (Signed)
Pt requesting a call back regarding what he should do physically post surgery, he asked what kind of exercises he should do. I made him a surgery FU appt next week and he's also asking if he should take bandage off beforehand.   [phone]

## 2016-10-08 NOTE — Assessment & Plan Note (Signed)
Opioids will be weaned every 5-7 days as postop pain should improve with time.

## 2016-10-08 NOTE — Progress Notes (Signed)
Facility Location: [facility]  Room Number: [id number]  Code Status: DNR  PCP: Imelda Pillow, NP [facility] [address] [place] [address]   This is a comprehensive admission note to [facility] performed on this date less than 30 days from date of admission. Included are preadmission medical/surgical history;reconciled medication list; family history; social history and comprehensive review of systems.  Corrections and additions to the records were documented . Comprehensive physical exam was also performed. Additionally a clinical summary was entered for each active diagnosis pertinent to this admission in the Problem List to enhance continuity of care.   HPI: The patient underwent left posterior subtalar arthrodesis arthroscopically 10/02/16 for subtalar pain of the left foot. Cefazolin was administered for antibiotic prophylaxis. Aspirin was administered for DVT prophylaxis.  Past medical and surgical history: He has a seizure disorder, last seizure was 2016. He is on Vimpat and Depakote. He's had hyperglycemia/prediabetes. He also has polycythemia, hypertension, dyslipidemia, COPD, and history of drug abuse. He's had surgery for scaphoid and ankle fractures. A brain biopsy was completed in 2009 for what proved to be a benign tumor. 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  Social history: Reviewed.He quit drinking in October 2017. He is described as having been a "heavy drinker". In AA 2 days a week. As of 10/08/16 "sober" 100 days.  Family history: Reviewed & updated  Review of systems: He has chronic pain in the left shoulder. He also has had recent onset of neck pain which he attributes to sleeping position. Numbness and tingling in right hand and feet is chronic. He describes intermittent anxiety and some depression. He was told by his ex-wife that he snored and "stopped  breathing".   Constitutional: No fever,significant weight change, fatigue  Eyes: No redness, discharge, pain, vision change ENT/mouth: No nasal congestion,  purulent discharge, earache,change in hearing ,sore throat  Cardiovascular: No chest pain, palpitations,paroxysmal nocturnal dyspnea, claudication, edema  Respiratory: No cough, sputum production,hemoptysis, DOE   Gastrointestinal: No heartburn,dysphagia,abdominal pain, nausea / vomiting,rectal bleeding, melena,change in bowels Genitourinary: No dysuria,hematuria, pyuria,  incontinence, nocturia Dermatologic: No rash, pruritus, change in appearance of skin Neurologic: No dizziness,headache,syncope, seizures Endocrine: No change in hair/skin/ nails, excessive thirst, excessive hunger, excessive urination  Hematologic/lymphatic: No significant bruising, lymphadenopathy,abnormal bleeding Allergy/immunology: No itchy/ watery eyes, significant sneezing, urticaria, angioedema  Physical exam:  Pertinent or positive findings: decreased lateral field of vision in the left eye. Dentures present. He is markedly ticklish. Breath sounds are decreased. Abdomen is protuberant. Pedal pulses are decreased in the right lower extremity. Left lower extremity in soft cast.   General appearance:Adequately nourished; no acute distress , increased work of breathing is present.   Lymphatic: No lymphadenopathy about the head, neck, axilla . Eyes: No conjunctival inflammation or lid edema is present. There is no scleral icterus. Ears:  External ear exam shows no significant lesions or deformities.   Nose:  External nasal examination shows no deformity or inflammation. Nasal mucosa are pink and moist without lesions ,exudates Oral exam: lips and gums are healthy appearing.There is no oropharyngeal erythema or exudate . Neck:  No thyromegaly, masses, tenderness noted.    Heart:  Normal rate and regular rhythm. S1 and S2 normal without gallop, murmur, click, rub .    Lungs:Chest clear to auscultation without wheezes, rhonchi,rales , rubs. Abdomen:Bowel sounds are normal. Abdomen is soft and nontender with no organomegaly, hernias,masses. GU: deferred .  Extremities:  No cyanosis, clubbing,edema  Neurologic exam : Strength equal  in upper & lower extremities Balance,Rhomberg,finger to nose testing could not be completed due to clinical state Deep tendon reflexes are equal & brisk Skin: Warm & dry w/o tenting. No significant lesions or rash.  See clinical summary under each active problem in the Problem List with associated updated therapeutic plan

## 2016-10-08 NOTE — Assessment & Plan Note (Signed)
Dr Tomi Likens 11/20/16 @ 8:30 am

## 2016-10-08 NOTE — Assessment & Plan Note (Signed)
Continue AA involvement

## 2016-10-14 ENCOUNTER — Telehealth (INDEPENDENT_AMBULATORY_CARE_PROVIDER_SITE_OTHER): Payer: Self-pay | Admitting: Orthopedic Surgery

## 2016-10-14 NOTE — Telephone Encounter (Signed)
Called pt to advise to do conservative things like elevation, ice, non weight bearing and wait until appt to discuss this with Dr.duda.

## 2016-10-14 NOTE — Telephone Encounter (Signed)
Patient is coming in Jan 24 (wednesday) @2pm , but states he is in pain (not at the surgical site) but at the top of the foot where he says Sharol Given was to put the cortisone injection. Please call and advise of what to do until seen Wednesday.

## 2016-10-15 ENCOUNTER — Other Ambulatory Visit (INDEPENDENT_AMBULATORY_CARE_PROVIDER_SITE_OTHER): Payer: Self-pay | Admitting: Orthopedic Surgery

## 2016-10-15 ENCOUNTER — Other Ambulatory Visit: Payer: Self-pay | Admitting: Neurology

## 2016-10-15 NOTE — Telephone Encounter (Signed)
Do you wish to fill  

## 2016-10-16 ENCOUNTER — Ambulatory Visit (INDEPENDENT_AMBULATORY_CARE_PROVIDER_SITE_OTHER): Payer: Medicare Other

## 2016-10-16 ENCOUNTER — Ambulatory Visit (INDEPENDENT_AMBULATORY_CARE_PROVIDER_SITE_OTHER): Payer: Medicare Other | Admitting: Orthopedic Surgery

## 2016-10-16 ENCOUNTER — Encounter (INDEPENDENT_AMBULATORY_CARE_PROVIDER_SITE_OTHER): Payer: Self-pay | Admitting: Orthopedic Surgery

## 2016-10-16 VITALS — Ht 72.0 in | Wt 220.0 lb

## 2016-10-16 DIAGNOSIS — M25572 Pain in left ankle and joints of left foot: Secondary | ICD-10-CM | POA: Diagnosis not present

## 2016-10-16 DIAGNOSIS — M25512 Pain in left shoulder: Secondary | ICD-10-CM

## 2016-10-16 DIAGNOSIS — G8929 Other chronic pain: Secondary | ICD-10-CM

## 2016-10-16 DIAGNOSIS — M19072 Primary osteoarthritis, left ankle and foot: Secondary | ICD-10-CM | POA: Insufficient documentation

## 2016-10-16 NOTE — Progress Notes (Signed)
Office Visit Note   Patient: Harold Bailey           Date of Birth: Sep 07, 1966           MRN: [id number] Visit Date: 10/16/2016              Requested by: Everardo Beals, NP [facility] [address] [place], [place] [address] PCP: Imelda Pillow, NP  Chief Complaint  Patient presents with  . Left Ankle - Routine Post Op    Left Posterior Subtalar Arthrodesis Arthroscopy 10/02/16 14 days post op.  . Left Shoulder - Pain    HPI: Patient presents today for left posterior subtalar arthrodesis arthroscopy. Patient is 14 days post op. Incision is clean and dry. There is no drainage. Patient is currently at [facility]. He is wondering if he should return home to his trailer. He is doing well but complains of lateral ankle pain. He states his medial ankle pain has resolved. He would like to discuss pain medication, he was advised by Dr. Linna Darner that he should be taking his pain medication every 6 hours, he would like this changed to every 4 hours.   He would also like left shoulder evaluated. He complains of acute pain after moving sheets on his bed. He denies numbness or tingling in left upper extremity. Maxcine Ham, RT    Assessment & Plan: Visit Diagnoses:  1. Acute pain of left shoulder   2. Chronic pain of left ankle   3. Osteoarthritis of subtalar joint, left     Plan: Continue nonweightbearing on the left with a fracture boot we will harvest the sutures today. Follow-up in 2 weeks with repeat 2 view radiographs of the left ankle. Anticipate advancing to full weightbearing at that time and he could be discharged to home. For the impingement left shoulder he may use his shoulder as tolerated no restrictions would not consider a subacromial injection until we have improvement with a healing on the left ankle. Again discussed the importance of smoking cessation.  Follow-Up Instructions: Return in about 2 weeks (around 10/30/2016).   Ortho  Exam On examination patient does have swelling of the left lower extremity with the leg being dependent. The incisions are clean and dry cellulitis no signs of infection. Examination the left shoulder he does have a congruent glenohumeral joint he has some mild pain with Neer and Hawkins impingement test. Radiographs shows decreased subacromial joint space consistent with impingement.  Imaging: Xr Ankle Complete Left  Result Date: 10/16/2016 Three-view radiographs of the left ankle show stable internal fixation or the posterior arthroscopic subtalar arthrodesis. No complicating features.  Xr Shoulder Left  Result Date: 10/16/2016 Three-view radiographs of the left shoulder shows a congruent joint there is decreased subacromial joint space. Lung field is clear.   Orders:  Orders Placed This Encounter  Procedures  . XR Ankle Complete Left  . XR Shoulder Left   No orders of the defined types were placed in this encounter.    Procedures: No procedures performed  Clinical Data: No additional findings.  Subjective: Review of Systems  Objective: Vital Signs: Ht 6' (1.829 m)   Wt 220 lb (99.8 kg)   BMI 29.84 kg/m   Specialty Comments:  No specialty comments available.  PMFS History: Patient Active Problem List   Diagnosis Date Noted  . 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 At risk for adverse drug event 10/08/2016  . History of snoring 10/08/2016  .  Subtalar joint instability, left   . 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 COPD with exacerbation ([place]) 07/02/2016  . History of alcohol abuse 07/02/2016  . COPD exacerbation ([place]) 07/02/2016  . Tobacco dependence 06/06/2015  . Medication overuse headache 02/28/2015  . Localization-related symptomatic epilepsy and epileptic syndromes with complex partial seizures, not intractable, without status epilepticus ([place]) 02/28/2015  . Cognitive impairment 02/28/2015  . 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  Tobacco abuse 02/28/2015  . Brain tumor ([place]) 11/19/2013  . Seizures ([place]) 11/19/2013  . Memory loss   . HA (headache)   . Gait disturbance   . Falls frequently 10/15/2013  . Swelling 09/09/2013  . Rash and nonspecific skin eruption 09/09/2013  . Ankle fracture, lateral malleolus, closed 09/03/2013  . Unspecified vitamin D deficiency 08/12/2013  . Polycythemia   . Memory deficit   . Drug abuse   . GERD (gastroesophageal reflux disease)   . Anxiety   . Arthritis   . Hyperlipidemia   . Prediabetes    Past Medical History:  Diagnosis Date  . Anxiety   . Arthritis    Left ankle  . COPD (chronic obstructive pulmonary disease) ([place])   . Drug abuse   . Gait disturbance   . GERD (gastroesophageal reflux disease)   . 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  Marland Kitchen Hyperlipidemia   . Hypertension   . 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 Memory loss   . Peripheral vision loss    Left due to brain surgery  . Pneumonia   . Polycythemia   . Prediabetes   . Seizures ([place]) 11/19/2013   none since 2016  . Shortness of breath    Hx of smoking.    Family History  Problem Relation Age of Onset  . Hypertension Mother   . Bronchitis Mother   . Cancer Maternal Aunt   . Cancer Maternal Grandmother   . Stroke Neg Hx   . Diabetes Neg Hx     Past Surgical History:  Procedure Laterality Date  . ANKLE ARTHROSCOPY  01/17/2012   Procedure: ANKLE ARTHROSCOPY;  Surgeon: [name], MD;  Location: [facility];  Service: Orthopedics;  Laterality: Left;  . ANKLE ARTHROSCOPY WITH FUSION Left 10/02/2016   Procedure: Left Posterior Subtalar Arthrodesis Arthroscopy;  Surgeon: [name], MD;  Location: [place];  Service: Orthopedics;  Laterality: Left;  . ANKLE FUSION Left 11/2012   Dr Sharol Given  . ANKLE SURGERY  2003   Left  . BRAIN SURGERY  2009   Biopsy  . FRACTURE SURGERY  2003   Left ankle  . HARDWARE REMOVAL Left 12/08/2012    Procedure: HARDWARE REMOVAL;  Surgeon: [name], MD;  Location: [place];  Service: Orthopedics;  Laterality: Left;  Removal Deep Hardware, Take Down Non-Union, Revision Internal Fixation Left Ankle  . I&D EXTREMITY Left 01/01/2013   Procedure: IRRIGATION AND DEBRIDEMENT EXTREMITY;  Surgeon: [name], MD;  Location: [place];  Service: Orthopedics;  Laterality: Left;  Irrigation, Debridement and Placement Antibiotic Beads Left Ankle  . INNER EAR SURGERY    . OPEN REDUCTION INTERNAL FIXATION (ORIF) SCAPHOID WITH DISTAL RADIUS GRAFT Right 02/01/2013   Procedure: OPEN REDUCTION INTERNAL FIXATION (ORIF) RIGHT SCAPHOID FRACTURE WITH DISTAL RADIUS GRAFT;  Surgeon: Schuyler Amor, MD;  Location: [place];  Service: Orthopedics;  Laterality: Right;  . ORIF ANKLE FRACTURE Left 12/08/2012   Procedure:  OPEN REDUCTION INTERNAL FIXATION (ORIF) ANKLE FRACTURE;  Surgeon: [name], MD;  Location: [place];  Service: Orthopedics;  Laterality: Left;  Removal Deep Hardware, Take Down Non-Union, Revision Internal Fixation Left Ankle  . ORIF ANKLE FRACTURE Right 09/03/2013   Procedure: OPEN REDUCTION INTERNAL FIXATION (ORIF) ANKLE FRACTURE;  Surgeon: [name], MD;  Location: [place];  Service: Orthopedics;  Laterality: Right;  Open Reduction Internal Fixation Right Fibula  . SCAPHOID FRACTURE SURGERY  02/04/2013  . SHOULDER ARTHROSCOPY  2008   Left  . TYMPANOSTOMY TUBE PLACEMENT  1993   Social History   Occupational History  .  Not Employed    Disabled   Social History Main Topics  . Smoking status: Current Every Day Smoker    Packs/day: 0.25    Years: 30.00    Types: Cigarettes  . Smokeless tobacco: Never Used  . Alcohol use No     Comment: quit Oct. 2017(heavy drinker)   . Drug use: No  . Sexual activity: Not on file

## 2016-10-18 ENCOUNTER — Other Ambulatory Visit: Payer: Self-pay

## 2016-10-18 MED ORDER — LACOSAMIDE 100 MG PO TABS: 1.0000 | ORAL_TABLET | Freq: Two times a day (BID) | ORAL | 0 refills | Status: DC

## 2016-10-18 NOTE — Telephone Encounter (Signed)
Pt called asking if he can use Voltaren gel where his stitches were removed.

## 2016-10-18 NOTE — Telephone Encounter (Signed)
Prescription request was received from:    [facility] [address] [place] [address]  Phone: [phone] Fax: [phone] 

## 2016-10-21 NOTE — Telephone Encounter (Signed)
Call pt and he advised that he is already rubbing it on there. He states that his incision is not open.

## 2016-10-30 ENCOUNTER — Ambulatory Visit (INDEPENDENT_AMBULATORY_CARE_PROVIDER_SITE_OTHER): Payer: Medicare Other

## 2016-10-30 ENCOUNTER — Encounter (INDEPENDENT_AMBULATORY_CARE_PROVIDER_SITE_OTHER): Payer: Self-pay | Admitting: Family

## 2016-10-30 ENCOUNTER — Ambulatory Visit (INDEPENDENT_AMBULATORY_CARE_PROVIDER_SITE_OTHER): Payer: Medicare Other | Admitting: Orthopedic Surgery

## 2016-10-30 ENCOUNTER — Ambulatory Visit (INDEPENDENT_AMBULATORY_CARE_PROVIDER_SITE_OTHER): Payer: Medicare Other | Admitting: Family

## 2016-10-30 VITALS — Ht 72.0 in | Wt 220.0 lb

## 2016-10-30 DIAGNOSIS — M25572 Pain in left ankle and joints of left foot: Secondary | ICD-10-CM | POA: Diagnosis not present

## 2016-10-30 DIAGNOSIS — Z981 Arthrodesis status: Secondary | ICD-10-CM

## 2016-10-30 NOTE — Progress Notes (Signed)
Office Visit Note   Patient: Harold Bailey           Date of Birth: [birth year]           MRN: [id number] Visit Date: 10/30/2016              Requested by: Everardo Beals, NP [facility] [address] [place], [place] [address] PCP: Imelda Pillow, NP  Chief Complaint  Patient presents with  . Left Foot - Routine Post Op    Left posterior subtalar arthrodesis arthroscopy 10/02/16 ~4 weeks post op.    HPI: Patient is 51 y.o male following up left ankle status post subtalar arthrodesis. He is doing well overall. He is nonweightbearing in cam walker with wheelchair. He has pain medially. He is at [facility]. Dr. Linna Darner is managing his pain medication, he has decreased narcotics and discontinued xanax. Maxcine Ham, RT    Assessment & Plan: Visit Diagnoses:  1. H/O ankle fusion   2. Pain in left ankle and joints of left foot     Plan: Advance weight for bearing in cam walker. Patient prefers to wear his AFO inside the [place] that is okay. We'll follow-up in office in 2 more weeks anticipate he will be discharging home soon. Physical therapy will be working with him for safe ambulation working on stairs as he has these at home.  Follow-Up Instructions: Return in about 2 weeks (around 11/13/2016).   Ortho Exam left ankle incisions are well healed. There is minimal swelling. No erythema and no drainage no open areas no sign of infection.  Imaging: Xr Ankle 2 Views Left  Result Date: 10/30/2016 Radiographs of the left ankle fusion are stable. No complicating features. No hardware failure.   Orders:  Orders Placed This Encounter  Procedures  . XR Ankle 2 Views Left   No orders of the defined types were placed in this encounter.    Procedures: No procedures performed  Clinical Data: No additional findings.  Subjective: Review of Systems  Objective: Vital Signs: Ht 6' (1.829 m)   Wt 220 lb (99.8 kg)   BMI 29.84 kg/m    Specialty Comments:  No specialty comments available.  PMFS History: Patient Active Problem List   Diagnosis Date Noted  . 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 At risk for adverse drug event 10/08/2016  . History of snoring 10/08/2016  . Subtalar joint instability, left   . 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 COPD with exacerbation ([place]) 07/02/2016  . History of alcohol abuse 07/02/2016  . COPD exacerbation ([place]) 07/02/2016  . Tobacco dependence 06/06/2015  . Medication overuse headache 02/28/2015  . Localization-related symptomatic epilepsy and epileptic syndromes with complex partial seizures, not intractable, without status epilepticus ([place]) 02/28/2015  . Cognitive impairment 02/28/2015  . 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 Tobacco abuse 02/28/2015  . Brain tumor ([place]) 11/19/2013  . Seizures ([place]) 11/19/2013  . Memory loss   . HA (headache)   . Gait disturbance   . Falls frequently 10/15/2013  . Swelling 09/09/2013  . Rash and nonspecific skin eruption 09/09/2013  . Ankle fracture, lateral malleolus, closed 09/03/2013  . Unspecified vitamin D deficiency 08/12/2013  . Polycythemia   . Memory deficit   . Drug abuse   . GERD (gastroesophageal reflux disease)   . Anxiety   . Arthritis   . Hyperlipidemia   . Prediabetes    Past Medical History:  Diagnosis Date  .  Anxiety   . Arthritis    Left ankle  . COPD (chronic obstructive pulmonary disease) ([place])   . Drug abuse   . Gait disturbance   . GERD (gastroesophageal reflux disease)   . 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  Marland Kitchen Hyperlipidemia   . Hypertension   . 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 Memory loss   . Peripheral vision loss    Left due to brain surgery  . Pneumonia   . Polycythemia   . Prediabetes   . Seizures ([place]) 11/19/2013   none since 2016  . Shortness of breath    Hx of smoking.     Family History  Problem Relation Age of Onset  . Hypertension Mother   . Bronchitis Mother   . Cancer Maternal Aunt   . Cancer Maternal Grandmother   . Stroke Neg Hx   . Diabetes Neg Hx     Past Surgical History:  Procedure Laterality Date  . ANKLE ARTHROSCOPY  01/17/2012   Procedure: ANKLE ARTHROSCOPY;  Surgeon: [name], MD;  Location: [facility];  Service: Orthopedics;  Laterality: Left;  . ANKLE ARTHROSCOPY WITH FUSION Left 10/02/2016   Procedure: Left Posterior Subtalar Arthrodesis Arthroscopy;  Surgeon: [name], MD;  Location: [place];  Service: Orthopedics;  Laterality: Left;  . ANKLE FUSION Left 11/2012   Dr Sharol Given  . ANKLE SURGERY  2003   Left  . BRAIN SURGERY  2009   Biopsy  . FRACTURE SURGERY  2003   Left ankle  . HARDWARE REMOVAL Left 12/08/2012   Procedure: HARDWARE REMOVAL;  Surgeon: [name], MD;  Location: [place];  Service: Orthopedics;  Laterality: Left;  Removal Deep Hardware, Take Down Non-Union, Revision Internal Fixation Left Ankle  . I&D EXTREMITY Left 01/01/2013   Procedure: IRRIGATION AND DEBRIDEMENT EXTREMITY;  Surgeon: [name], MD;  Location: [place];  Service: Orthopedics;  Laterality: Left;  Irrigation, Debridement and Placement Antibiotic Beads Left Ankle  . INNER EAR SURGERY    . OPEN REDUCTION INTERNAL FIXATION (ORIF) SCAPHOID WITH DISTAL RADIUS GRAFT Right 02/01/2013   Procedure: OPEN REDUCTION INTERNAL FIXATION (ORIF) RIGHT SCAPHOID FRACTURE WITH DISTAL RADIUS GRAFT;  Surgeon: Schuyler Amor, MD;  Location: [place];  Service: Orthopedics;  Laterality: Right;  . ORIF ANKLE FRACTURE Left 12/08/2012   Procedure: OPEN REDUCTION INTERNAL FIXATION (ORIF) ANKLE FRACTURE;  Surgeon: [name], MD;  Location: [place];  Service: Orthopedics;  Laterality: Left;  Removal Deep Hardware, Take Down Non-Union, Revision Internal Fixation Left Ankle  . ORIF ANKLE FRACTURE Right 09/03/2013   Procedure: OPEN REDUCTION INTERNAL FIXATION (ORIF) ANKLE FRACTURE;   Surgeon: [name], MD;  Location: [place];  Service: Orthopedics;  Laterality: Right;  Open Reduction Internal Fixation Right Fibula  . SCAPHOID FRACTURE SURGERY  02/04/2013  . SHOULDER ARTHROSCOPY  2008   Left  . TYMPANOSTOMY TUBE PLACEMENT  1993   Social History   Occupational History  .  Not Employed    Disabled   Social History Main Topics  . Smoking status: Current Every Day Smoker    Packs/day: 0.25    Years: 30.00    Types: Cigarettes  . Smokeless tobacco: Never Used  . Alcohol use No     Comment: quit Oct. 2017(heavy drinker)   . Drug use: No  . Sexual activity: Not on file

## 2016-10-31 ENCOUNTER — Non-Acute Institutional Stay (SKILLED_NURSING_FACILITY): Payer: Medicare Other | Admitting: Internal Medicine

## 2016-10-31 ENCOUNTER — Other Ambulatory Visit (INDEPENDENT_AMBULATORY_CARE_PROVIDER_SITE_OTHER): Payer: Self-pay | Admitting: Family

## 2016-10-31 ENCOUNTER — Telehealth (INDEPENDENT_AMBULATORY_CARE_PROVIDER_SITE_OTHER): Payer: Self-pay | Admitting: Orthopedic Surgery

## 2016-10-31 ENCOUNTER — Telehealth (INDEPENDENT_AMBULATORY_CARE_PROVIDER_SITE_OTHER): Payer: Self-pay | Admitting: Family

## 2016-10-31 ENCOUNTER — Encounter: Payer: Self-pay | Admitting: Internal Medicine

## 2016-10-31 DIAGNOSIS — F1011 Alcohol abuse, in remission: Secondary | ICD-10-CM

## 2016-10-31 DIAGNOSIS — Z87898 Personal history of other specified conditions: Secondary | ICD-10-CM

## 2016-10-31 DIAGNOSIS — F1911 Other psychoactive substance abuse, in remission: Secondary | ICD-10-CM

## 2016-10-31 DIAGNOSIS — M25375 Other instability, left foot: Secondary | ICD-10-CM

## 2016-10-31 DIAGNOSIS — F419 Anxiety disorder, unspecified: Secondary | ICD-10-CM

## 2016-10-31 DIAGNOSIS — Z9189 Other specified personal risk factors, not elsewhere classified: Secondary | ICD-10-CM | POA: Diagnosis not present

## 2016-10-31 MED ORDER — NAPROXEN 500 MG PO TABS: 500.0000 mg | ORAL_TABLET | Freq: Two times a day (BID) | ORAL | 0 refills | Status: DC | PRN

## 2016-10-31 NOTE — Assessment & Plan Note (Addendum)
Opiod risks discussed with the patient ;written documentation  provided continue narcotic wean

## 2016-10-31 NOTE — Telephone Encounter (Signed)
Patient called advised he has a boot that he was wearing that another doctor prescribed for him. He was told by the therapist that he could not wear it because it was not prescribed by Dr Sharol Given, Patient asked if Dr Sharol Given will give the ok for him to wear the boot. Patient said the big boot is loose and the other boot help support the big boot and keep it from shaking around. Patient asked if Autumn would call him. The number to contact him is [phone]

## 2016-10-31 NOTE — Progress Notes (Signed)
    [facility]: [place]  PCP Sachse, Luane School, NP [facility] [place] [address]  This is a nursing facility follow up for specific acute issue of post op pain & risk of  Adverse reaction to drug .  Interim medical record and care since last [place] visit was updated with review of diagnostic studies and change in clinical status since last visit were documented.  HPI: He was seen at the orthopedic office yesterday 2/7,the left ankle fusion was felt to be well-healed and imaging revealed stable alignment. Weightbearing was to be advanced as tolerated. He was to be discharged from SNF when he could ambulate safely &  attend to activities of daily living. Follow-up was scheduled for 2/21. Psychiatry consultation 10/23/16 was reviewed & summarized in Problem list under related diagnoses.Increased risk of abuse of and contraindication to controlled substances expressed by Braulio Bosch ,Psych NP.  Review of systems: Focus is on MS pain. Staff reports narcotics requested by the clock. He cannot understand why the low-dose opioid cannot be continued  Physical exam:  Pertinent or positive findings: Scalp closely cropped. He is edentulous. Although he states he is not smoking, there was a strong odor of tobacco on his breath. Breath sounds are decreased. He has trace ankle edema. Pedal pulses are minimally decreased. He became very agitated and argumentative as we discussed the opioid risk tool and indications for continuing the opioid weaning and discontinuation.  General appearance:Adequately nourished; no acute distress , increased work of breathing is present.   Lymphatic: No lymphadenopathy about the head, neck, axilla . Eyes: No conjunctival inflammation or lid edema is present. There is no scleral icterus. Ears:  External ear exam shows no significant lesions or deformities.   Nose:  External nasal examination  shows no deformity or inflammation. Nasal mucosa are pink and moist without lesions ,exudates Oral exam: lips and gums are healthy appearing.There is no oropharyngeal erythema or exudate . Neck:  No thyromegaly, masses, tenderness noted.    Heart:  Normal rate and regular rhythm. S1 and S2 normal without gallop, murmur, click, rub .  Lungs:Chest clear to auscultation without wheezes, rhonchi,rales , rubs. Abdomen:Bowel sounds are normal. Abdomen is soft and nontender with no organomegaly, hernias,masses. GU: deferred  Extremities:  No cyanosis, clubbing Neurologic exam : Strength equal  in upper & lower extremities Balance,Rhomberg,finger to nose testing could not be completed due to clinical state Skin: Warm & dry w/o tenting. No significant lesions or rash.    See summary under each active problem in the Problem List with associated updated therapeutic plan. A copy of the opiod risk calculation indicating adverse risk of over 83% was given to him. The opioid will be given after PT/OT if he's having pain, once a day. After 1 week it will be discontinued.

## 2016-10-31 NOTE — Telephone Encounter (Signed)
Sent in naproxen to [facility] for him

## 2016-10-31 NOTE — Telephone Encounter (Signed)
Will call back to advise about rx request and boot at the same time. Rx request sent to [facility] to advise.

## 2016-10-31 NOTE — Telephone Encounter (Signed)
Patient is requesting muscle relaxer and stronger anti inflammatory.  He states he spoke with Junie Panning about this yesterday.

## 2016-10-31 NOTE — Assessment & Plan Note (Signed)
Dr. Sharol Given does not feel narcotics are indicated at this time for his orthopedic issues Because of the extremely high adverse drug risk with the opioids, they will continue to be weaned and discontinued The patient will be given option to seek second opinion from his PCP or chronic pain center

## 2016-11-01 ENCOUNTER — Telehealth (INDEPENDENT_AMBULATORY_CARE_PROVIDER_SITE_OTHER): Payer: Self-pay | Admitting: *Deleted

## 2016-11-01 ENCOUNTER — Other Ambulatory Visit (INDEPENDENT_AMBULATORY_CARE_PROVIDER_SITE_OTHER): Payer: Self-pay | Admitting: Family

## 2016-11-01 MED ORDER — NAPROXEN 500 MG PO TABS: 500.0000 mg | ORAL_TABLET | Freq: Two times a day (BID) | ORAL | 0 refills | Status: DC | PRN

## 2016-11-01 NOTE — Patient Instructions (Signed)
See assessment and plan under each diagnosis in the problem list for this visit  

## 2016-11-01 NOTE — Assessment & Plan Note (Signed)
Cntrolled substances contraindicated; narcotics will be weaned & D/Ced

## 2016-11-01 NOTE — Telephone Encounter (Signed)
Patient called in this morning in regards to having an issue with getting his medication. He was prescribed anti-inflammatory Naproxen and it was sent to [facility] but he needs it sent to [facility]. His CB # [phone]. Thank you

## 2016-11-01 NOTE — Assessment & Plan Note (Signed)
Continued AA involvement encouraged

## 2016-11-01 NOTE — Assessment & Plan Note (Signed)
As per Psych

## 2016-11-01 NOTE — Telephone Encounter (Signed)
I called and left voicemail, advised naproxen was sent to pharmacy, muscle relaxer declined. And he is ok to wear other boot as long as it immobilizes ankle.

## 2016-11-01 NOTE — Telephone Encounter (Signed)
Pt called stating [facility] has not received [id number]

## 2016-11-01 NOTE — Telephone Encounter (Signed)
Called pt lm on vm to advise that rx is being faxed to [facility] [phone] now.

## 2016-11-06 ENCOUNTER — Ambulatory Visit: Payer: Medicare Other | Admitting: Neurology

## 2016-11-07 ENCOUNTER — Non-Acute Institutional Stay (SKILLED_NURSING_FACILITY): Payer: Medicare Other | Admitting: Nurse Practitioner

## 2016-11-07 ENCOUNTER — Other Ambulatory Visit: Payer: Self-pay | Admitting: Nurse Practitioner

## 2016-11-07 ENCOUNTER — Encounter: Payer: Self-pay | Admitting: Nurse Practitioner

## 2016-11-07 DIAGNOSIS — F419 Anxiety disorder, unspecified: Secondary | ICD-10-CM

## 2016-11-07 DIAGNOSIS — J449 Chronic obstructive pulmonary disease, unspecified: Secondary | ICD-10-CM

## 2016-11-07 DIAGNOSIS — R569 Unspecified convulsions: Secondary | ICD-10-CM

## 2016-11-07 DIAGNOSIS — Z9189 Other specified personal risk factors, not elsewhere classified: Secondary | ICD-10-CM | POA: Diagnosis not present

## 2016-11-07 DIAGNOSIS — M25375 Other instability, left foot: Secondary | ICD-10-CM

## 2016-11-07 MED ORDER — FLUTICASONE-SALMETEROL 250-50 MCG/DOSE IN AEPB: 1.0000 | INHALATION_SPRAY | Freq: Two times a day (BID) | RESPIRATORY_TRACT | 0 refills | Status: DC

## 2016-11-07 MED ORDER — ALBUTEROL SULFATE HFA 108 (90 BASE) MCG/ACT IN AERS: 2.0000 | INHALATION_SPRAY | RESPIRATORY_TRACT | 0 refills | Status: DC | PRN

## 2016-11-07 MED ORDER — NAPROXEN 500 MG PO TABS: 500.0000 mg | ORAL_TABLET | Freq: Two times a day (BID) | ORAL | 0 refills | Status: DC | PRN

## 2016-11-07 MED ORDER — TRAZODONE HCL 50 MG PO TABS: ORAL_TABLET | ORAL | 0 refills | Status: DC

## 2016-11-07 MED ORDER — DIVALPROEX SODIUM ER 500 MG PO TB24: 1000.0000 mg | ORAL_TABLET | Freq: Every day | ORAL | 0 refills | Status: DC

## 2016-11-07 MED ORDER — GABAPENTIN 300 MG PO CAPS: 300.0000 mg | ORAL_CAPSULE | Freq: Three times a day (TID) | ORAL | 0 refills | Status: DC

## 2016-11-07 NOTE — Progress Notes (Signed)
Nursing Home Location:  [facility] Room: Union of Service: SNF (31)  PCP: Imelda Pillow, NP  Allergies  Allergen Reactions  . No Known Allergies     Chief Complaint  Patient presents with  . Discharge Note    HPI:  Patient is a 51 y.o. male seen today at [facility] for discharge home. The patient underwent left posterior subtalar arthrodesis arthroscopically 10/02/16 for subtalar pain of the left foot. Cefazolin was administered for antibiotic prophylaxis. Aspirin was give for DVT prophylaxis. Pt has been on a slow wean of narcotics, now getting only after therapy and will not receive a RX for pain medication on discharge.  Review of Systems:  Review of Systems  Constitutional: Negative for activity change, appetite change, fatigue and unexpected weight change.  HENT: Negative for congestion and hearing loss.   Eyes: Negative.   Respiratory: Negative for cough and shortness of breath.   Cardiovascular: Negative for chest pain, palpitations and leg swelling.  Gastrointestinal: Negative for abdominal pain, constipation and diarrhea.  Genitourinary: Negative for difficulty urinating and dysuria.  Musculoskeletal: Positive for arthralgias (pain to ankle). Negative for myalgias.  Skin: Negative for color change and wound.  Neurological: Negative for dizziness and weakness.  Psychiatric/Behavioral: Negative for agitation, behavioral problems and confusion. The patient is nervous/anxious.     Past Medical History:  Diagnosis Date  . Anxiety   . Arthritis    Left ankle  . COPD (chronic obstructive pulmonary disease) ([place])   . Drug abuse   . Gait disturbance   . GERD (gastroesophageal reflux disease)   . 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  Marland Kitchen Hyperlipidemia   . Hypertension   . 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 Memory loss   . Peripheral vision loss    Left due to brain surgery  . Pneumonia   . Polycythemia   . Prediabetes   . Seizures ([place]) 11/19/2013   none since 2016  . Shortness of breath    Hx of smoking.   Past Surgical History:  Procedure Laterality Date  . ANKLE ARTHROSCOPY  01/17/2012   Procedure: ANKLE ARTHROSCOPY;  Surgeon: [name], MD;  Location: [place];  Service: Orthopedics;  Laterality: Left;  . ANKLE ARTHROSCOPY WITH FUSION Left 10/02/2016   Procedure: Left Posterior Subtalar Arthrodesis Arthroscopy;  Surgeon: [name], MD;  Location: [place];  Service: Orthopedics;  Laterality: Left;  . ANKLE FUSION Left 11/2012   Dr Sharol Given  . ANKLE SURGERY  2003   Left  . BRAIN SURGERY  2009   Biopsy  . FRACTURE SURGERY  2003   Left ankle  . HARDWARE REMOVAL Left 12/08/2012   Procedure: HARDWARE REMOVAL;  Surgeon: [name], MD;  Location: [place];  Service: Orthopedics;  Laterality: Left;  Removal Deep Hardware, Take Down Non-Union, Revision Internal Fixation Left Ankle  . I&D EXTREMITY Left 01/01/2013   Procedure: IRRIGATION AND DEBRIDEMENT EXTREMITY;  Surgeon: [name], MD;  Location: [place];  Service: Orthopedics;  Laterality: Left;  Irrigation, Debridement and Placement Antibiotic Beads Left Ankle  . INNER EAR SURGERY    . OPEN REDUCTION INTERNAL FIXATION (ORIF) SCAPHOID WITH DISTAL RADIUS GRAFT Right 02/01/2013   Procedure: OPEN REDUCTION INTERNAL FIXATION (ORIF) RIGHT SCAPHOID FRACTURE WITH DISTAL RADIUS GRAFT;  Surgeon: Schuyler Amor, MD;  Location: [place];  Service: Orthopedics;  Laterality: Right;  . ORIF ANKLE FRACTURE Left 12/08/2012   Procedure: OPEN REDUCTION INTERNAL FIXATION (ORIF) ANKLE FRACTURE;  Surgeon: [name], MD;  Location: [place];  Service: Orthopedics;  Laterality: Left;  Removal Deep Hardware, Take Down Non-Union, Revision Internal Fixation Left Ankle  . ORIF ANKLE FRACTURE Right 09/03/2013   Procedure: OPEN REDUCTION INTERNAL FIXATION (ORIF) ANKLE FRACTURE;  Surgeon: [name], MD;   Location: [place];  Service: Orthopedics;  Laterality: Right;  Open Reduction Internal Fixation Right Fibula  . SCAPHOID FRACTURE SURGERY  02/04/2013  . SHOULDER ARTHROSCOPY  2008   Left  . TYMPANOSTOMY TUBE PLACEMENT  1993   Social History:   reports that he has been smoking Cigarettes.  He has a 7.50 pack-year smoking history. He has never used smokeless tobacco. He reports that he does not drink alcohol or use drugs.  Family History  Problem Relation Age of Onset  . Hypertension Mother   . Bronchitis Mother   . Cancer Maternal Aunt   . Cancer Maternal Grandmother   . Stroke Neg Hx   . Diabetes Neg Hx     Medications: Patient's Medications  New Prescriptions   No medications on file  Previous Medications   ACETAMINOPHEN (TYLENOL) 500 MG TABLET    Take 500 mg by mouth every 6 (six) hours as needed.   ALBUTEROL (PROVENTIL HFA;VENTOLIN HFA) 108 (90 BASE) MCG/ACT INHALER    Inhale 2 puffs into the lungs every 4 (four) hours as needed for wheezing or shortness of breath (cough, shortness of breath or wheezing.).   DIVALPROEX (DEPAKOTE ER) 500 MG 24 HR TABLET    Take 2 tablets (1,000 mg total) by mouth daily. NO MORE REFILLS. MUST KEEP FEB. APPT   DOCUSATE SODIUM (COLACE) 100 MG CAPSULE    Take 100 mg by mouth 2 (two) times daily.   FLUTICASONE-SALMETEROL (ADVAIR DISKUS) 250-50 MCG/DOSE AEPB    Inhale 1 puff into the lungs 2 (two) times daily.   GABAPENTIN (NEURONTIN) 300 MG CAPSULE    Take 300 mg by mouth 3 (three) times daily.    LACOSAMIDE (VIMPAT) 100 MG TABS    Take 1 tablet (100 mg total) by mouth 2 (two) times daily.   MULTIPLE VITAMIN (MULTIVITAMIN WITH MINERALS) TABS TABLET    Take 1 tablet by mouth daily.   NAPROXEN (NAPROSYN) 500 MG TABLET    Take 1 tablet (500 mg total) by mouth 2 (two) times daily as needed for mild pain or moderate pain. With meals   OXYCODONE-ACETAMINOPHEN (PERCOCET/ROXICET) 5-325 MG TABLET    Take 1 tablet by mouth daily. May take after PT/OT.   TRAZODONE  (DESYREL) 50 MG TABLET    Take 1/2 tablet by mouth twice daily as needed for anxiety. Stop Date 11/23/16  Modified Medications   No medications on file  Discontinued Medications   No medications on file     Physical Exam: Vitals:   11/07/16 1114  BP: 100/62  Pulse: 94  Resp: 20  Temp: 97.9 F (36.6 C)  SpO2: 97%  Weight: 222 lb 12.8 oz (101.1 kg)  Height: 6' (1.829 m)    Physical Exam  Constitutional: He is oriented to person, place, and time. He appears well-developed and well-nourished. No distress.  HENT:  Head: Normocephalic and atraumatic.  Mouth/Throat: Oropharynx is clear and moist. No oropharyngeal exudate.  Eyes: Conjunctivae and EOM are normal. Pupils are equal, round, and reactive to light.  Neck: Normal range of motion. Neck supple.  Cardiovascular:  Normal rate, regular rhythm and normal heart sounds.   Pulmonary/Chest: Effort normal and breath sounds normal.  Abdominal: Soft. Bowel sounds are normal.  Musculoskeletal: He exhibits no edema or tenderness.  Neurological: He is alert and oriented to person, place, and time. Coordination normal.  Skin: Skin is warm and dry. He is not diaphoretic.  Psychiatric: He has a normal mood and affect.    Labs reviewed: Basic Metabolic Panel:  Recent Labs  07/01/16 2208 10/02/16 0925  NA 140 141  K 3.8 4.5  CL 103 109  CO2 26 26  GLUCOSE 129* 101*  BUN 13 20  CREATININE 0.91 0.74  CALCIUM 8.6* 9.3   Liver Function Tests:  Recent Labs  07/01/16 2208  AST 18  ALT 16*  ALKPHOS 51  BILITOT 0.8  PROT 6.9  ALBUMIN 3.8   No results for input(s): LIPASE, AMYLASE in the last 8760 hours. No results for input(s): AMMONIA in the last 8760 hours. CBC:  Recent Labs  07/01/16 2208 07/03/16 0533 10/02/16 0925  WBC 6.8 6.9 7.1  NEUTROABS 3.7  --   --   HGB 17.6* 16.8 18.2*  HCT 53.6* 51.6 54.4*  MCV 95.7 95.0 95.9  PLT 137* 124* 156   TSH: No results for input(s): TSH in the last 8760 hours. A1C: Lab  Results  Component Value Date   HGBA1C 5.9 (H) 11/22/2013   Lipid Panel: No results for input(s): CHOL, HDL, LDLCALC, TRIG, CHOLHDL, LDLDIRECT in the last 8760 hours.  Radiological Exams: No results found.   Assessment/Plan 1. Subtalar joint instability, left Stable, doing well with rehab, now able to dc home with home health. No narcotic RX will be given on discharge.  -cont ASA - naproxen (NAPROSYN) 500 MG tablet; Take 1 tablet (500 mg total) by mouth 2 (two) times daily as needed for mild pain or moderate pain. With meals  Dispense: 60 tablet; Refill: 0  2. Anxiety -stable, conts on trazodone until 11/23/16 - traZODone (DESYREL) 50 MG tablet; Take 1/2 tablet by mouth twice daily as needed for anxiety. Stop Date 11/23/16  Dispense: 15 tablet; Refill: 0  3. At risk for adverse drug event -pt has been slowly weaned from hydrocodone and is down to 1 tablet daily which is to be stopped after 11/07/16, if he needs any additional medication this will need to be re-evaluated and discussed with orthopedic. Pt aware of this.   4. Seizures (HCC) No recent seizures -conts on vimpat and dekakote  - divalproex (DEPAKOTE ER) 500 MG 24 hr tablet; Take 2 tablets (1,000 mg total) by mouth daily.  Dispense: 180 tablet; Refill: 0  5. Chronic obstructive pulmonary disease, unspecified COPD type ([place]) -stable - albuterol (PROVENTIL HFA;VENTOLIN HFA) 108 (90 Base) MCG/ACT inhaler; Inhale 2 puffs into the lungs every 4 (four) hours as needed for wheezing or shortness of breath (cough, shortness of breath or wheezing.).  Dispense: 1 Inhaler; Refill: 0 - Fluticasone-Salmeterol (ADVAIR DISKUS) 250-50 MCG/DOSE AEPB; Inhale 1 puff into the lungs 2 (two) times daily.  Dispense: 60 each; Refill: 0  pt is stable for discharge-will need PT/OT/per home health. DME needed includes RW, WC. Rx written for vimpat, other medication sent via epic, NO NARCOTIC medicaiton prescribed. .  will need to follow up with PCP within  2 weeks.     Carlos American. Harle Battiest  [facility] [phone] 8 am - 5 pm) [phone] (after hours)

## 2016-11-11 ENCOUNTER — Telehealth (INDEPENDENT_AMBULATORY_CARE_PROVIDER_SITE_OTHER): Payer: Self-pay | Admitting: *Deleted

## 2016-11-11 NOTE — Telephone Encounter (Signed)
Call patient. [facility] should provide prescriptions for discharge from skilled nursing until he can get to our office. We can prescribe medication once he is seen at the office.

## 2016-11-11 NOTE — Telephone Encounter (Signed)
Patient called in this morning in regards to wanting to know if he could possibly get prescribed pain medicine? He is being released from [facility] tomorrow and he wanted to know if he could get prescribed something for pain. Thank you His CB # [phone].

## 2016-11-11 NOTE — Telephone Encounter (Signed)
I called and spoke with patient to advise that Dr. Sharol Given would not write this for him, that the medical director will discharge him with something. Otherwise we will see him at his appointment on 2/21

## 2016-11-13 ENCOUNTER — Encounter (INDEPENDENT_AMBULATORY_CARE_PROVIDER_SITE_OTHER): Payer: Self-pay | Admitting: Orthopedic Surgery

## 2016-11-13 ENCOUNTER — Telehealth (INDEPENDENT_AMBULATORY_CARE_PROVIDER_SITE_OTHER): Payer: Self-pay | Admitting: Orthopedic Surgery

## 2016-11-13 ENCOUNTER — Ambulatory Visit (INDEPENDENT_AMBULATORY_CARE_PROVIDER_SITE_OTHER): Payer: Medicare Other | Admitting: Family

## 2016-11-13 DIAGNOSIS — Z981 Arthrodesis status: Secondary | ICD-10-CM

## 2016-11-13 NOTE — Progress Notes (Signed)
Office Visit Note   Patient: Harold Bailey           Date of Birth: [birth year]           MRN: [id number] Visit Date: 11/13/2016              Requested by: Everardo Beals, NP [facility] [address] [place], [place] [address] PCP: Imelda Pillow, NP  Chief Complaint  Patient presents with  . Left Ankle - Follow-up    Left Posterior Subtalar Arthrodesis Arthroscopy 10/02/16    HPI: Patient is 51 y.o male who presents today for follow up for left ankle status post subtalar arthrodesis. He is 6 weeks post op. He has been paring left heel, and questions if he has done too much. There is no active drainage. He is requesting prescription for rollator. He would like prescription sent to [facility]. He is ambulating with a walker and cam walker. He is wanting muscle relaxer and pain medication. He states he was discharged home without anything. He states he has nsaids, and BC powder is causing him GI upset. He took 8 bc powders since yesterday with aleve and motrin. Maxcine Ham, RT    Assessment & Plan: Visit Diagnoses:  1. H/O ankle fusion     Plan: advance weight bearing as tolerated have discussed that he should discontinue the cam walker. Declined to refill his narcotics. May use naproxen or ibu. Should not combine these with BC powder. No   Follow-Up Instructions: Return in about 4 weeks (around 12/11/2016), or if symptoms worsen or fail to improve.   Ortho Exam Physical Exam  Constitutional: Appears well-developed.  Head: Normocephalic.  Eyes: EOM are normal.  Neck: Normal range of motion.  Cardiovascular: Normal rate.   Pulmonary/Chest: Effort normal.  Neurological: Is alert.  Skin: Skin is warm.  Psychiatric: Has a normal mood and affect. Incisions are all well healed. No erythema. No swelling. No drainage. No open areas.   Imaging: No results found.  Orders:  No orders of the defined types were placed in this encounter.  No orders of  the defined types were placed in this encounter.    Procedures: No procedures performed  Clinical Data: No additional findings.  Subjective: Review of Systems  Constitutional: Negative for chills and fever.  Musculoskeletal: Negative for arthralgias and joint swelling.  Skin: Negative for wound.    Objective: Vital Signs: There were no vitals taken for this visit.  Specialty Comments:  No specialty comments available.  PMFS History: Patient Active Problem List   Diagnosis Date Noted  . H/O ankle fusion 11/14/2016  . 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 At risk for adverse drug event 10/08/2016  . History of snoring 10/08/2016  . Subtalar joint instability, left   . 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 COPD with exacerbation ([place]) 07/02/2016  . History of alcohol abuse 07/02/2016  . COPD exacerbation ([place]) 07/02/2016  . Tobacco dependence 06/06/2015  . Medication overuse headache 02/28/2015  . Localization-related symptomatic epilepsy and epileptic syndromes with complex partial seizures, not intractable, without status epilepticus ([place]) 02/28/2015  . Cognitive impairment 02/28/2015  . 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 Tobacco abuse 02/28/2015  . Brain tumor ([place]) 11/19/2013  . Seizures ([place]) 11/19/2013  . Memory loss   . HA (headache)   . Gait disturbance   . Falls frequently 10/15/2013  . Swelling 09/09/2013  . Rash and nonspecific skin eruption 09/09/2013  . Ankle  fracture, lateral malleolus, closed 09/03/2013  . Unspecified vitamin D deficiency 08/12/2013  . Polycythemia   . Memory deficit   . History of drug abuse   . GERD (gastroesophageal reflux disease)   . Anxiety   . Arthritis   . Hyperlipidemia   . Prediabetes    Past Medical History:  Diagnosis Date  . Anxiety   . Arthritis    Left ankle  . COPD (chronic obstructive pulmonary disease) ([place])   . Drug abuse   . Gait disturbance   . GERD (gastroesophageal reflux  disease)   . 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  Marland Kitchen Hyperlipidemia   . Hypertension   . 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 Memory loss   . Peripheral vision loss    Left due to brain surgery  . Pneumonia   . Polycythemia   . Prediabetes   . Seizures ([place]) 11/19/2013   none since 2016  . Shortness of breath    Hx of smoking.    Family History  Problem Relation Age of Onset  . Hypertension Mother   . Bronchitis Mother   . Cancer Maternal Aunt   . Cancer Maternal Grandmother   . Stroke Neg Hx   . Diabetes Neg Hx     Past Surgical History:  Procedure Laterality Date  . ANKLE ARTHROSCOPY  01/17/2012   Procedure: ANKLE ARTHROSCOPY;  Surgeon: [name], MD;  Location: [place];  Service: Orthopedics;  Laterality: Left;  . ANKLE ARTHROSCOPY WITH FUSION Left 10/02/2016   Procedure: Left Posterior Subtalar Arthrodesis Arthroscopy;  Surgeon: [name], MD;  Location: [place];  Service: Orthopedics;  Laterality: Left;  . ANKLE FUSION Left 11/2012   Dr Sharol Given  . ANKLE SURGERY  2003   Left  . BRAIN SURGERY  2009   Biopsy  . FRACTURE SURGERY  2003   Left ankle  . HARDWARE REMOVAL Left 12/08/2012   Procedure: HARDWARE REMOVAL;  Surgeon: [name], MD;  Location: [place];  Service: Orthopedics;  Laterality: Left;  Removal Deep Hardware, Take Down Non-Union, Revision Internal Fixation Left Ankle  . I&D EXTREMITY Left 01/01/2013   Procedure: IRRIGATION AND DEBRIDEMENT EXTREMITY;  Surgeon: [name], MD;  Location: [place];  Service: Orthopedics;  Laterality: Left;  Irrigation, Debridement and Placement Antibiotic Beads Left Ankle  . INNER EAR SURGERY    . OPEN REDUCTION INTERNAL FIXATION (ORIF) SCAPHOID WITH DISTAL RADIUS GRAFT Right 02/01/2013   Procedure: OPEN REDUCTION INTERNAL FIXATION (ORIF) RIGHT SCAPHOID FRACTURE WITH DISTAL RADIUS GRAFT;  Surgeon: Schuyler Amor, MD;  Location: [place];  Service:  Orthopedics;  Laterality: Right;  . ORIF ANKLE FRACTURE Left 12/08/2012   Procedure: OPEN REDUCTION INTERNAL FIXATION (ORIF) ANKLE FRACTURE;  Surgeon: [name], MD;  Location: [place];  Service: Orthopedics;  Laterality: Left;  Removal Deep Hardware, Take Down Non-Union, Revision Internal Fixation Left Ankle  . ORIF ANKLE FRACTURE Right 09/03/2013   Procedure: OPEN REDUCTION INTERNAL FIXATION (ORIF) ANKLE FRACTURE;  Surgeon: [name], MD;  Location: [place];  Service: Orthopedics;  Laterality: Right;  Open Reduction Internal Fixation Right Fibula  . SCAPHOID FRACTURE SURGERY  02/04/2013  . SHOULDER ARTHROSCOPY  2008   Left  . TYMPANOSTOMY TUBE PLACEMENT  1993   Social History   Occupational History  .  Not Employed    Disabled   Social History Main Topics  .  Smoking status: Current Every Day Smoker    Packs/day: 0.25    Years: 30.00    Types: Cigarettes  . Smokeless tobacco: Never Used  . Alcohol use No     Comment: quit Oct. 2017(heavy drinker) .In [place]  . Drug use: No  . Sexual activity: Not Currently

## 2016-11-13 NOTE — Telephone Encounter (Signed)
Patient is wondering if he could use Voltaren before or after he put ice on it. Or if it even matters? Also was asking about a wheelchair. [phone]

## 2016-11-14 ENCOUNTER — Other Ambulatory Visit (INDEPENDENT_AMBULATORY_CARE_PROVIDER_SITE_OTHER): Payer: Self-pay

## 2016-11-14 ENCOUNTER — Telehealth (INDEPENDENT_AMBULATORY_CARE_PROVIDER_SITE_OTHER): Payer: Self-pay | Admitting: Orthopedic Surgery

## 2016-11-14 ENCOUNTER — Other Ambulatory Visit (INDEPENDENT_AMBULATORY_CARE_PROVIDER_SITE_OTHER): Payer: Self-pay | Admitting: Family

## 2016-11-14 DIAGNOSIS — M25375 Other instability, left foot: Secondary | ICD-10-CM

## 2016-11-14 DIAGNOSIS — Z981 Arthrodesis status: Secondary | ICD-10-CM | POA: Insufficient documentation

## 2016-11-14 MED ORDER — NAPROXEN 500 MG PO TABS: 500.0000 mg | ORAL_TABLET | Freq: Two times a day (BID) | ORAL | 0 refills | Status: DC | PRN

## 2016-11-14 NOTE — Telephone Encounter (Signed)
I called pt and advised that we would fax over an order for therapy and a walker for this pt. Also no refill of narcotic pain medication pt voiced understanding advised can take naproxen and this was faxed into the pts pharm. To call with questions.

## 2016-11-14 NOTE — Telephone Encounter (Signed)
Patient called needing something stronger for pain. Patient said he is taking BC powder and it is tearing his stomach up.  Patient said [facility] was not contacted for his [facility] (PT)  Patient advised he also asked for a rollgate. The number to contact patient is [phone]

## 2016-11-14 NOTE — Telephone Encounter (Signed)
Autumn already spoke with this patient today.

## 2016-11-15 ENCOUNTER — Telehealth (INDEPENDENT_AMBULATORY_CARE_PROVIDER_SITE_OTHER): Payer: Self-pay | Admitting: Family

## 2016-11-15 DIAGNOSIS — Z981 Arthrodesis status: Secondary | ICD-10-CM

## 2016-11-15 DIAGNOSIS — R2689 Other abnormalities of gait and mobility: Secondary | ICD-10-CM

## 2016-11-15 DIAGNOSIS — R2681 Unsteadiness on feet: Secondary | ICD-10-CM

## 2016-11-15 NOTE — Telephone Encounter (Signed)
[facility] [id number] for unsteady gait. Rx for rollator faxed to [facility]. Patient aware.

## 2016-11-20 ENCOUNTER — Ambulatory Visit: Payer: Medicare Other | Admitting: Neurology

## 2016-11-20 DIAGNOSIS — M19072 Primary osteoarthritis, left ankle and foot: Secondary | ICD-10-CM | POA: Diagnosis not present

## 2016-11-20 DIAGNOSIS — Z4789 Encounter for other orthopedic aftercare: Secondary | ICD-10-CM

## 2016-11-20 DIAGNOSIS — G3184 Mild cognitive impairment, so stated: Secondary | ICD-10-CM | POA: Diagnosis not present

## 2016-11-20 DIAGNOSIS — J449 Chronic obstructive pulmonary disease, unspecified: Secondary | ICD-10-CM

## 2016-11-21 ENCOUNTER — Telehealth (INDEPENDENT_AMBULATORY_CARE_PROVIDER_SITE_OTHER): Payer: Self-pay | Admitting: Orthopedic Surgery

## 2016-11-21 NOTE — Telephone Encounter (Signed)
Hilda Blades from [facility] called asking to continue PT 1 week 1 3 week 2. She wanted to know about any precations and if he should be currently wearing his ankle brace? She also asked if a rocker bottom modification shoe would be beneficial, if so please order. Thank you. CB # [phone]

## 2016-11-21 NOTE — Telephone Encounter (Signed)
Patient has no restrictions with physical therapy. He may benefit from a rocker-bottom modification to his shoe we will evaluate for this in the office.

## 2016-11-22 ENCOUNTER — Ambulatory Visit (INDEPENDENT_AMBULATORY_CARE_PROVIDER_SITE_OTHER): Payer: Medicare Other | Admitting: Neurology

## 2016-11-22 ENCOUNTER — Encounter: Payer: Self-pay | Admitting: Neurology

## 2016-11-22 VITALS — BP 120/84 | HR 94 | Ht 72.0 in | Wt 248.0 lb

## 2016-11-22 DIAGNOSIS — F17200 Nicotine dependence, unspecified, uncomplicated: Secondary | ICD-10-CM

## 2016-11-22 DIAGNOSIS — R569 Unspecified convulsions: Secondary | ICD-10-CM

## 2016-11-22 DIAGNOSIS — R4189 Other symptoms and signs involving cognitive functions and awareness: Secondary | ICD-10-CM

## 2016-11-22 DIAGNOSIS — G40209 Localization-related (focal) (partial) symptomatic epilepsy and epileptic syndromes with complex partial seizures, not intractable, without status epilepticus: Secondary | ICD-10-CM

## 2016-11-22 DIAGNOSIS — G0490 Encephalitis and encephalomyelitis, unspecified: Secondary | ICD-10-CM | POA: Diagnosis not present

## 2016-11-22 MED ORDER — LACOSAMIDE 100 MG PO TABS: 1.0000 | ORAL_TABLET | Freq: Two times a day (BID) | ORAL | 5 refills | Status: DC

## 2016-11-22 MED ORDER — DIVALPROEX SODIUM ER 500 MG PO TB24: 1000.0000 mg | ORAL_TABLET | Freq: Every day | ORAL | 1 refills | Status: DC
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
## 2016-11-22 NOTE — Telephone Encounter (Signed)
I called and left voicemail for Harold Bailey for message below.

## 2016-11-22 NOTE — Patient Instructions (Signed)
1.  Continue lacosamide 100mg  twice daily and divalproex 1000mg  daily 2.  Recheck CBC, CMP and trough valproic acid level in 6 months.  3.  Follow up in 6 months after repeat labs. 4.  Try to continue working on quitting smoking

## 2016-11-25 ENCOUNTER — Telehealth (INDEPENDENT_AMBULATORY_CARE_PROVIDER_SITE_OTHER): Payer: Self-pay | Admitting: Orthopedic Surgery

## 2016-11-25 ENCOUNTER — Other Ambulatory Visit (INDEPENDENT_AMBULATORY_CARE_PROVIDER_SITE_OTHER): Payer: Self-pay

## 2016-11-25 NOTE — Telephone Encounter (Signed)
Order faxed to [place] unable to fax because no return phone number given. Pt has been advised again that he can not use BC powder like he is. Pt voiced understanding will call with questions.

## 2016-11-25 NOTE — Telephone Encounter (Signed)
Physical therapist calling to get the Weight Bearing Status on L ankle. Also she would like to let you know that patient is taking BC powders at a rate of 1-2 every couple of hours for "nuisance pain" that is described as a 5 on a pain level of 1-10.  Please advise.

## 2016-11-28 ENCOUNTER — Telehealth (INDEPENDENT_AMBULATORY_CARE_PROVIDER_SITE_OTHER): Payer: Self-pay | Admitting: Orthopedic Surgery

## 2016-11-28 NOTE — Telephone Encounter (Signed)
Harold Bailey with [facility] called advised they opened for (PT) for the patient 11/21/16 but [facility] had already opened 11/20/16 so they will have to pull out. The number to contact Melissa  Is [phone]

## 2016-11-28 NOTE — Telephone Encounter (Signed)
Also patient would like a rocker shoe

## 2016-12-04 ENCOUNTER — Telehealth (INDEPENDENT_AMBULATORY_CARE_PROVIDER_SITE_OTHER): Payer: Self-pay

## 2016-12-04 NOTE — Telephone Encounter (Signed)
[facility] called to advise that pt has missed several of her physical therapy appts because of fatigue. Just FYI

## 2016-12-05 ENCOUNTER — Telehealth (INDEPENDENT_AMBULATORY_CARE_PROVIDER_SITE_OTHER): Payer: Self-pay | Admitting: Orthopedic Surgery

## 2016-12-05 NOTE — Telephone Encounter (Signed)
Autumn and myself both talked to this patient over the phone. I advised him that no narcotics can be prescribed. He is unable to go to church due to his pain. He feels like the surgery should have been in the area causing him pain. Advised him this will be discussed at his next appt with Dr. Sharol Given. He states he is getting a new physical therapist in home.

## 2016-12-05 NOTE — Telephone Encounter (Signed)
Patient would like to speak to someone about pain he is having in his left ankle where he had surgery.  cb#:: [phone]

## 2016-12-06 ENCOUNTER — Telehealth (INDEPENDENT_AMBULATORY_CARE_PROVIDER_SITE_OTHER): Payer: Self-pay

## 2016-12-06 NOTE — Telephone Encounter (Signed)
[facility] calling stating PT running out would like extension. Also wants order for Education officer, museum. Also patient completely out of pain medication and taking BC's. She would like a call back.

## 2016-12-06 NOTE — Telephone Encounter (Signed)
I called and left voicemail for Harold Bailey to give verbal for extension of physical therapy and for social worker. Advised that again no pain medication to be prescribed at this time. Patient has been counseled numerous times that narcotics are not appropriate treatment. He has history of seizures, and further narcotics can lead him into possible respiratory distress. He has been told this by Dr. Linna Darner at the facility, and by Dr. Sharol Given and Junie Panning, as well as Autumn and myself. He has upcoming appointment on Wednesday for repeat evaluation.

## 2016-12-09 ENCOUNTER — Other Ambulatory Visit: Payer: Self-pay | Admitting: Nurse Practitioner

## 2016-12-09 DIAGNOSIS — J449 Chronic obstructive pulmonary disease, unspecified: Secondary | ICD-10-CM

## 2016-12-09 DIAGNOSIS — F419 Anxiety disorder, unspecified: Secondary | ICD-10-CM

## 2016-12-11 ENCOUNTER — Telehealth (INDEPENDENT_AMBULATORY_CARE_PROVIDER_SITE_OTHER): Payer: Self-pay | Admitting: Orthopedic Surgery

## 2016-12-11 ENCOUNTER — Encounter (INDEPENDENT_AMBULATORY_CARE_PROVIDER_SITE_OTHER): Payer: Self-pay | Admitting: Orthopedic Surgery

## 2016-12-11 ENCOUNTER — Ambulatory Visit (INDEPENDENT_AMBULATORY_CARE_PROVIDER_SITE_OTHER): Payer: Medicare Other

## 2016-12-11 ENCOUNTER — Ambulatory Visit (INDEPENDENT_AMBULATORY_CARE_PROVIDER_SITE_OTHER): Payer: Medicare Other | Admitting: Orthopedic Surgery

## 2016-12-11 DIAGNOSIS — Z981 Arthrodesis status: Secondary | ICD-10-CM

## 2016-12-11 MED ORDER — GABAPENTIN 300 MG PO CAPS: 300.0000 mg | ORAL_CAPSULE | Freq: Three times a day (TID) | ORAL | 0 refills | Status: DC

## 2016-12-11 NOTE — Telephone Encounter (Signed)
Patient called stating that Dr. Sharol Given gave him a prescription for Hydrocodone.  He took it to the pharmacy and they won't fill it, because of his insurance, he is asking if he can get something else for pain.  CB#[phone].

## 2016-12-11 NOTE — Progress Notes (Signed)
Office Visit Note   Patient: Harold Bailey           Date of Birth: January 01, 1966           MRN: [id number] Visit Date: 12/11/2016              Requested by: Everardo Beals, NP [facility] [address] [place], [place] [address] PCP: Imelda Pillow, NP  Chief Complaint  Patient presents with  . Left Ankle - Follow-up    Left posterior subtalar arthrodesis arthroscopy 10/02/16. Over 2 months post op.     [id number] is status post ankle and subtalar fusion. He is about 10 weeks out from the subtalar fusion. Patient complains of pain dorsally over the ankle. He also complains of pain in the lateral femoral cutaneous nerve distribution of the left thigh. HPI  Assessment & Plan: Visit Diagnoses:  1. H/O ankle fusion     Plan: Patient is given a refill prescription for his Neurontin. He is given a 916 since heel lift to unload the ankle. 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  Follow-Up Instructions: Return in about 4 weeks (around 01/08/2017).   Ortho Exam Examination there is no redness no cellulitis no swelling no signs of infection. Patient is point tender to palpation dorsally over the ankle area about 3 cm in diameter. Patient is also having some symptoms in the lateral femoral cutaneous nerve of the left thigh H and states he's doing a lot of sitting. ROS: Negative for fever or chills Imaging: Xr Ankle Complete Left  Result Date: 12/11/2016 Three-view radiographs of the left ankle shows stable internal fixation of the subtalar and ankle joint. No complicating features no loosening of the hardware.   Labs: Lab Results  Component Value Date   HGBA1C 5.9 (H) 11/22/2013   HGBA1C 5.5 08/12/2013   REPTSTATUS 01/06/2013 FINAL 01/01/2013   REPTSTATUS 01/04/2013 FINAL 01/01/2013   GRAMSTAIN  01/01/2013    RARE WBC PRESENT, PREDOMINANTLY PMN RARE SQUAMOUS EPITHELIAL CELLS PRESENT RARE GRAM POSITIVE COCCI  IN PAIRS   GRAMSTAIN  01/01/2013    RARE WBC PRESENT, PREDOMINANTLY PMN RARE SQUAMOUS EPITHELIAL CELLS PRESENT RARE GRAM POSITIVE COCCI IN PAIRS   CULT NO ANAEROBES ISOLATED 01/01/2013   CULT  01/01/2013    FEW METHICILLIN RESISTANT STAPHYLOCOCCUS AUREUS Note: RIFAMPIN AND GENTAMICIN SHOULD NOT BE USED AS SINGLE DRUGS FOR TREATMENT OF STAPH INFECTIONS. This organism DOES NOT demonstrate inducible Clindamycin resistance in vitro. CRITICAL RESULT CALLED TO, READ BACK BY AND VERIFIED WITH: SYLIVA H 4/14  @930  BY REAMM   LABORGA METHICILLIN RESISTANT STAPHYLOCOCCUS AUREUS 01/01/2013    Orders:  Orders Placed This Encounter  Procedures  . XR Ankle Complete Left   Meds ordered this encounter  Medications  . gabapentin (NEURONTIN) 300 MG capsule    Sig: Take 1 capsule (300 mg total) by mouth 3 (three) times daily.    Dispense:  90 capsule    Refill:  0     Procedures: No procedures performed  Clinical Data: No additional findings.  Subjective: Review of Systems  Objective: Vital Signs: There were no vitals taken for this visit.  Specialty Comments:  No specialty comments available.  PMFS History: Patient Active Problem List   Diagnosis Date Noted  . H/O ankle fusion 11/14/2016  . 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 At risk for adverse drug event 10/08/2016  . History of snoring 10/08/2016  . Subtalar joint  instability, left   . 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 COPD with exacerbation ([place]) 07/02/2016  . History of alcohol abuse 07/02/2016  . COPD exacerbation ([place]) 07/02/2016  . Tobacco dependence 06/06/2015  . Medication overuse headache 02/28/2015  . Localization-related symptomatic epilepsy and epileptic syndromes with complex partial seizures, not intractable, without status epilepticus ([place]) 02/28/2015  . Cognitive impairment 02/28/2015  . 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 Tobacco abuse 02/28/2015  . Brain tumor ([place]) 11/19/2013    . Seizures ([place]) 11/19/2013  . Memory loss   . HA (headache)   . Gait disturbance   . Falls frequently 10/15/2013  . Swelling 09/09/2013  . Rash and nonspecific skin eruption 09/09/2013  . Ankle fracture, lateral malleolus, closed 09/03/2013  . Unspecified vitamin D deficiency 08/12/2013  . Polycythemia   . Memory deficit   . History of drug abuse   . GERD (gastroesophageal reflux disease)   . Anxiety   . Arthritis   . Hyperlipidemia   . Prediabetes    Past Medical History:  Diagnosis Date  . Anxiety   . Arthritis    Left ankle  . COPD (chronic obstructive pulmonary disease) ([place])   . Drug abuse   . Gait disturbance   . GERD (gastroesophageal reflux disease)   . 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  Marland Kitchen Hyperlipidemia   . Hypertension   . 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 Memory loss   . Peripheral vision loss    Left due to brain surgery  . Pneumonia   . Polycythemia   . Prediabetes   . Seizures ([place]) 11/19/2013   none since 2016  . Shortness of breath    Hx of smoking.    Family History  Problem Relation Age of Onset  . Hypertension Mother   . Bronchitis Mother   . Cancer Maternal Aunt   . Cancer Maternal Grandmother   . Stroke Neg Hx   . Diabetes Neg Hx     Past Surgical History:  Procedure Laterality Date  . ANKLE ARTHROSCOPY  01/17/2012   Procedure: ANKLE ARTHROSCOPY;  Surgeon: [name], MD;  Location: [facility];  Service: Orthopedics;  Laterality: Left;  . ANKLE ARTHROSCOPY WITH FUSION Left 10/02/2016   Procedure: Left Posterior Subtalar Arthrodesis Arthroscopy;  Surgeon: [name], MD;  Location: [facility];  Service: Orthopedics;  Laterality: Left;  . ANKLE FUSION Left 11/2012   Dr Sharol Given  . ANKLE SURGERY  2003   Left  . BRAIN SURGERY  2009   Biopsy  . FRACTURE SURGERY  2003   Left ankle  . HARDWARE REMOVAL Left 12/08/2012   Procedure: HARDWARE REMOVAL;  Surgeon: [name], MD;  Location: [facility];  Service: Orthopedics;  Laterality: Left;  Removal Deep Hardware, Take Down Non-Union, Revision Internal Fixation Left Ankle  . I&D EXTREMITY Left 01/01/2013   Procedure: IRRIGATION AND DEBRIDEMENT EXTREMITY;  Surgeon: [name], MD;  Location: [facility];  Service: Orthopedics;  Laterality: Left;  Irrigation, Debridement and Placement Antibiotic Beads Left Ankle  . INNER EAR SURGERY    . OPEN REDUCTION INTERNAL FIXATION (ORIF) SCAPHOID WITH DISTAL RADIUS GRAFT Right 02/01/2013   Procedure: OPEN REDUCTION INTERNAL FIXATION (ORIF) RIGHT SCAPHOID FRACTURE WITH DISTAL RADIUS GRAFT;  Surgeon: Schuyler Amor, MD;  Location: [facility];  Service: Orthopedics;  Laterality: Right;  . ORIF ANKLE FRACTURE Left 12/08/2012  Procedure: OPEN REDUCTION INTERNAL FIXATION (ORIF) ANKLE FRACTURE;  Surgeon: [name], MD;  Location: [facility];  Service: Orthopedics;  Laterality: Left;  Removal Deep Hardware, Take Down Non-Union, Revision Internal Fixation Left Ankle  . ORIF ANKLE FRACTURE Right 09/03/2013   Procedure: OPEN REDUCTION INTERNAL FIXATION (ORIF) ANKLE FRACTURE;  Surgeon: [name], MD;  Location: [facility];  Service: Orthopedics;  Laterality: Right;  Open Reduction Internal Fixation Right Fibula  . SCAPHOID FRACTURE SURGERY  02/04/2013  . SHOULDER ARTHROSCOPY  2008   Left  . TYMPANOSTOMY TUBE PLACEMENT  1993   Social History   Occupational History  .  Not Employed    Disabled   Social History Main Topics  . Smoking status: Current Every Day Smoker    Packs/day: 0.25    Years: 30.00    Types: Cigarettes  . Smokeless tobacco: Never Used  . Alcohol use No     Comment: quit Oct. 2017(heavy drinker) .In [place]  . Drug use: No  . Sexual activity: Not Currently

## 2016-12-12 NOTE — Telephone Encounter (Signed)
This message was taken on wrong pt.

## 2016-12-25 ENCOUNTER — Telehealth (INDEPENDENT_AMBULATORY_CARE_PROVIDER_SITE_OTHER): Payer: Self-pay | Admitting: Orthopedic Surgery

## 2016-12-25 NOTE — Telephone Encounter (Signed)
Please see message below and advise.

## 2016-12-25 NOTE — Telephone Encounter (Signed)
U call, yes it's okay to ambulate without the boot on. We can give him a note to delay Court appearance but cannot give him a note to get out of court appearance.

## 2016-12-25 NOTE — Telephone Encounter (Signed)
Patient called have a few questions about his boot on his foot. Wanting to know if he could take it off to walk? Also asked about a letter for court. CB # [phone]

## 2016-12-26 NOTE — Telephone Encounter (Signed)
I called pt to advise of message below. Pt asked " I am I flagged somewhere that I can't get pain mediation?" pt states that " all these other people that are out here get pain medication all the time and they don't need it but here I am I really need it and I can't get it" the pt asks " tell me girl you can be honest with me am I flagged?" I advised that there is no comparison to him and any other pt that I can discuss what goes on with him and not other patients. He states that he just needs something for pain that he is starting a new job and that he needs to have a little something to help with the pain. Advised that he can use OTC anti inflammatories as he has been advised before. Pt voiced understanding and will call with additional questions he states that he does not need the letter for court at this time.

## 2017-01-01 ENCOUNTER — Other Ambulatory Visit (INDEPENDENT_AMBULATORY_CARE_PROVIDER_SITE_OTHER): Payer: Self-pay | Admitting: Orthopedic Surgery

## 2017-01-01 ENCOUNTER — Other Ambulatory Visit: Payer: Self-pay | Admitting: Nurse Practitioner

## 2017-01-01 DIAGNOSIS — F419 Anxiety disorder, unspecified: Secondary | ICD-10-CM

## 2017-01-07 ENCOUNTER — Ambulatory Visit (INDEPENDENT_AMBULATORY_CARE_PROVIDER_SITE_OTHER): Payer: Medicare Other | Admitting: Orthopedic Surgery

## 2017-01-07 ENCOUNTER — Ambulatory Visit (INDEPENDENT_AMBULATORY_CARE_PROVIDER_SITE_OTHER): Payer: Medicare Other

## 2017-01-07 ENCOUNTER — Encounter (INDEPENDENT_AMBULATORY_CARE_PROVIDER_SITE_OTHER): Payer: Self-pay | Admitting: Orthopedic Surgery

## 2017-01-07 VITALS — Ht 72.0 in | Wt 248.0 lb

## 2017-01-07 DIAGNOSIS — M25572 Pain in left ankle and joints of left foot: Secondary | ICD-10-CM

## 2017-01-07 DIAGNOSIS — M12571 Traumatic arthropathy, right ankle and foot: Secondary | ICD-10-CM | POA: Diagnosis not present

## 2017-01-07 DIAGNOSIS — M25571 Pain in right ankle and joints of right foot: Secondary | ICD-10-CM

## 2017-01-07 MED ORDER — DICLOFENAC SODIUM 1 % TD GEL: TRANSDERMAL | 3 refills | Status: DC

## 2017-01-07 MED ORDER — NABUMETONE 750 MG PO TABS
750.0000 mg | ORAL_TABLET | Freq: Two times a day (BID) | ORAL | 3 refills | Status: AC | PRN
Start: 2017-01-07 — End: 2017-02-06

## 2017-01-07 NOTE — Progress Notes (Signed)
Office Visit Note   Patient: Harold Bailey           Date of Birth: July 20, 1966           MRN: [id number] Visit Date: 01/07/2017              Requested by: Everardo Beals, NP [facility] [address] [place], [place] [address] PCP: Imelda Pillow, NP  Chief Complaint  Patient presents with  . Left Ankle - Follow-up    10/02/16 left posterior subtalar arthrodesis   . Right Ankle - Pain      HPI: Patient states he is doing therapy at home doing ankle range of motion exercises when he states he felt popping in his ankles. He states when he pushed against a surface with his left heel his left heel felt mushy. Patient states that status post a revision of the subtalar fusion on the left pain from the fibrous union has completely resolved in the left foot. Patient's concern with the popping in both ankles.  Assessment & Plan: Visit Diagnoses:  1. Pain in left ankle and joints of left foot   2. Pain in right ankle and joints of right foot   3. Traumatic arthritis of ankle, right     Plan: Patient was given a prescription for Voltaren gel as well as Relafen. Discussed that I do not feel that narcotic pain medicine would be appropriate for his chronic symptoms especially in light of his use of anti-seizure medication. 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  Follow-Up Instructions: Return in about 4 weeks (around 02/04/2017).   Ortho Exam  Patient is alert, oriented, no adenopathy, well-dressed, normal affect, normal respiratory effort. Examination patient does have some venous stasis swelling in both legs there is no warmth no redness no induration no open ulcers. Patient has a stable left ankle fusion and subtalar joint. There is no hypersensitivity to light touch she has good  pulses. His foot is plantigrade there were no plantar ulcers. Patient has no tenderness to palpation of the sinus Tarsi. Examination of the right ankle there is pain-free range of motion without crepitation and there is no pain with range of motion of the subtalar joint. Patient has negative straight leg raise bilaterally and a negative Homans test.  Imaging: Xr Ankle Complete Left  Result Date: 01/07/2017 Two-view radiographs of the left ankle shows a stable ankle fusion and subtalar fusion. No bony abnormalities no failure no lytic changes  Xr Ankle 2 Views Right  Result Date: 01/07/2017 Two-view radiographs of the right ankle shows subcondylar cysts and degenerative changes over the lateral joint line of the tibial talar joint. Patient does have a congruent mortise but is developing collapse from traumatic arthritis of the lateral joint line. No complicating features of the internal fixation.  Xr Foot 2 Views Left  Result Date: 01/07/2017 Two-view radiographs of the left foot shows stable subtalar fusion there is no bony abnormalities throughout the forefoot or midfoot no joint space narrowing.   Labs: Lab Results  Component Value Date   HGBA1C 5.9 (H) 11/22/2013   HGBA1C 5.5 08/12/2013   REPTSTATUS 01/06/2013 FINAL 01/01/2013   REPTSTATUS 01/04/2013 FINAL 01/01/2013   GRAMSTAIN  01/01/2013    RARE WBC PRESENT, PREDOMINANTLY PMN RARE SQUAMOUS  EPITHELIAL CELLS PRESENT RARE GRAM POSITIVE COCCI IN PAIRS   GRAMSTAIN  01/01/2013    RARE WBC PRESENT, PREDOMINANTLY PMN RARE SQUAMOUS EPITHELIAL CELLS PRESENT RARE GRAM POSITIVE COCCI IN PAIRS   CULT NO ANAEROBES ISOLATED 01/01/2013   CULT  01/01/2013    FEW METHICILLIN RESISTANT STAPHYLOCOCCUS AUREUS Note: RIFAMPIN AND GENTAMICIN SHOULD NOT BE USED AS SINGLE DRUGS FOR TREATMENT OF STAPH INFECTIONS. This organism DOES NOT demonstrate inducible Clindamycin resistance in vitro. CRITICAL RESULT CALLED TO, READ BACK BY AND VERIFIED WITH:  SYLIVA H 4/14  @930  BY REAMM   LABORGA METHICILLIN RESISTANT STAPHYLOCOCCUS AUREUS 01/01/2013    Orders:  Orders Placed This Encounter  Procedures  . XR Ankle Complete Left  . XR Foot 2 Views Left  . XR Ankle 2 Views Right   Meds ordered this encounter  Medications  . diclofenac sodium (VOLTAREN) 1 % GEL    Sig: APPLY 2 TO 4 GRAMS EXTERNALLY TO THE AFFECTED AREA TWICE DAILY    Dispense:  500 g    Refill:  3  . nabumetone (RELAFEN) 750 MG tablet    Sig: Take 1 tablet (750 mg total) by mouth 2 (two) times daily as needed for mild pain or moderate pain. with food    Dispense:  60 tablet    Refill:  3     Procedures: No procedures performed  Clinical Data: No additional findings.  ROS:  All other systems negative, except as noted in the HPI. Review of Systems  Objective: Vital Signs: Ht 6' (1.829 m)   Wt 248 lb (112.5 kg)   BMI 33.63 kg/m   Specialty Comments:  No specialty comments available.  PMFS History: Patient Active Problem List   Diagnosis Date Noted  . Traumatic arthritis of ankle, right 01/07/2017  . H/O ankle fusion 11/14/2016  . 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 At risk for adverse drug event 10/08/2016  . History of snoring 10/08/2016  . Subtalar joint instability, left   . 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 COPD with exacerbation ([place]) 07/02/2016  . History of alcohol abuse 07/02/2016  . COPD exacerbation ([place]) 07/02/2016  . Tobacco dependence 06/06/2015  . Medication overuse headache 02/28/2015  . Localization-related symptomatic epilepsy and epileptic syndromes with complex partial seizures, not intractable, without status epilepticus ([place]) 02/28/2015  . Cognitive impairment 02/28/2015  . 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 Tobacco abuse 02/28/2015  . Brain tumor ([place]) 11/19/2013  . Seizures ([place]) 11/19/2013  . Memory loss   . HA (headache)   . Gait disturbance   . Pain in left ankle and joints of left foot  10/15/2013  . Falls frequently 10/15/2013  . Swelling 09/09/2013  . Rash and nonspecific skin eruption 09/09/2013  . Ankle fracture, lateral malleolus, closed 09/03/2013  . Unspecified vitamin D deficiency 08/12/2013  . Polycythemia   . Memory deficit   . History of drug abuse   . GERD (gastroesophageal reflux disease)   . Anxiety   . Arthritis   . Hyperlipidemia   . Prediabetes    Past Medical History:  Diagnosis Date  . Anxiety   . Arthritis    Left ankle  . COPD (chronic obstructive pulmonary disease) ([place])   . Drug abuse   . Gait disturbance   . GERD (gastroesophageal reflux disease)   . 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  Marland Kitchen Hyperlipidemia   . Hypertension   . 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 Memory loss   . Peripheral vision loss    Left due to brain surgery  . Pneumonia   . Polycythemia   . Prediabetes   . Seizures ([place]) 11/19/2013   none since 2016  . Shortness of breath    Hx of smoking.    Family History  Problem Relation Age of Onset  . Hypertension Mother   . Bronchitis Mother   . Cancer Maternal Aunt   . Cancer Maternal Grandmother   . Stroke Neg Hx   . Diabetes Neg Hx     Past Surgical History:  Procedure Laterality Date  . ANKLE ARTHROSCOPY  01/17/2012   Procedure: ANKLE ARTHROSCOPY;  Surgeon: [name], MD;  Location: [facility];  Service: Orthopedics;  Laterality: Left;  . ANKLE ARTHROSCOPY WITH FUSION Left 10/02/2016   Procedure: Left Posterior Subtalar Arthrodesis Arthroscopy;  Surgeon: [name], MD;  Location: [facility];  Service: Orthopedics;  Laterality: Left;  . ANKLE FUSION Left 11/2012   Dr Sharol Given  . ANKLE SURGERY  2003   Left  . BRAIN SURGERY  2009   Biopsy  . FRACTURE SURGERY  2003   Left ankle  . HARDWARE REMOVAL Left 12/08/2012   Procedure: HARDWARE REMOVAL;  Surgeon: [name], MD;  Location: [facility];  Service: Orthopedics;  Laterality: Left;   Removal Deep Hardware, Take Down Non-Union, Revision Internal Fixation Left Ankle  . I&D EXTREMITY Left 01/01/2013   Procedure: IRRIGATION AND DEBRIDEMENT EXTREMITY;  Surgeon: [name], MD;  Location: [facility];  Service: Orthopedics;  Laterality: Left;  Irrigation, Debridement and Placement Antibiotic Beads Left Ankle  . INNER EAR SURGERY    . OPEN REDUCTION INTERNAL FIXATION (ORIF) SCAPHOID WITH DISTAL RADIUS GRAFT Right 02/01/2013   Procedure: OPEN REDUCTION INTERNAL FIXATION (ORIF) RIGHT SCAPHOID FRACTURE WITH DISTAL RADIUS GRAFT;  Surgeon: Schuyler Amor, MD;  Location: [facility];  Service: Orthopedics;  Laterality: Right;  . ORIF ANKLE FRACTURE Left 12/08/2012   Procedure: OPEN REDUCTION INTERNAL FIXATION (ORIF) ANKLE FRACTURE;  Surgeon: [name], MD;  Location: [facility];  Service: Orthopedics;  Laterality: Left;  Removal Deep Hardware, Take Down Non-Union, Revision Internal Fixation Left Ankle  . ORIF ANKLE FRACTURE Right 09/03/2013   Procedure: OPEN REDUCTION INTERNAL FIXATION (ORIF) ANKLE FRACTURE;  Surgeon: [name], MD;  Location: [facility];  Service: Orthopedics;  Laterality: Right;  Open Reduction Internal Fixation Right Fibula  . SCAPHOID FRACTURE SURGERY  02/04/2013  . SHOULDER ARTHROSCOPY  2008   Left  . TYMPANOSTOMY TUBE PLACEMENT  1993   Social History   Occupational History  .  Not Employed    Disabled   Social History Main Topics  . Smoking status: Current Every Day Smoker    Packs/day: 0.25    Years: 30.00    Types: Cigarettes  . Smokeless tobacco: Never Used  . Alcohol use No     Comment: quit Oct. 2017(heavy drinker) .In [place]  . Drug use: No  . Sexual activity: Not Currently

## 2017-01-09 ENCOUNTER — Ambulatory Visit (INDEPENDENT_AMBULATORY_CARE_PROVIDER_SITE_OTHER): Payer: Medicare Other | Admitting: Orthopedic Surgery

## 2017-01-13 ENCOUNTER — Telehealth (INDEPENDENT_AMBULATORY_CARE_PROVIDER_SITE_OTHER): Payer: Self-pay | Admitting: Orthopedic Surgery

## 2017-01-13 NOTE — Telephone Encounter (Signed)
U call patient and have him follow-up in the office tomorrow. Patient cannot be taking the amount of nonsteroidals he's taken he can only take 2 Relafen a day and nothing else.

## 2017-01-13 NOTE — Telephone Encounter (Signed)
I called and spoke with patient. He advised over the phone he feels he is getting worse. He wants to know when he may feel better. I asked him if he is using the relafen and voltaren, he replied yes and that this gives him no relief. He is having to take four relafen a day. He also reports taking 50bc powders in 4 days. Advised against this. He is icing and elevating. He states he exercises daily. He does not know what else to do. Please advise.

## 2017-01-13 NOTE — Telephone Encounter (Signed)
Patient states he's still having trouble walking & request a call

## 2017-01-14 ENCOUNTER — Ambulatory Visit (INDEPENDENT_AMBULATORY_CARE_PROVIDER_SITE_OTHER): Payer: Medicare Other | Admitting: Orthopedic Surgery

## 2017-01-14 DIAGNOSIS — M722 Plantar fascial fibromatosis: Secondary | ICD-10-CM | POA: Insufficient documentation

## 2017-01-14 DIAGNOSIS — M25572 Pain in left ankle and joints of left foot: Secondary | ICD-10-CM | POA: Diagnosis not present

## 2017-01-14 MED ORDER — METHYLPREDNISOLONE ACETATE 40 MG/ML IJ SUSP
40.0000 mg | INTRAMUSCULAR | Status: AC | PRN
  Administered 2017-01-14: 40 mg

## 2017-01-14 MED ORDER — LIDOCAINE HCL 1 % IJ SOLN
2.0000 mL | INTRAMUSCULAR | Status: AC | PRN
  Administered 2017-01-14: 2 mL

## 2017-01-14 NOTE — Telephone Encounter (Signed)
Faxed to number provided below and previous number given to me in office. Patient aware. Spoke with him in office that this would be done today.

## 2017-01-14 NOTE — Telephone Encounter (Signed)
I called and spoke with patient to advise of message below. He states he was just here. I explained I understand and advised that because relafen and voltaren are not helping. Dr. Sharol Given would like repeat evaluation. He is unsure of transportation. Advised if he does arrange transportation to give [place] a call back and we can work him in. Otherwise we can see him in the office next week. Since Dr. Sharol Given will be out the remainder of the week for a conference. He expressed understanding. I explained to him that he will not heal the same way as his friend that happened to have the same surgery, advised that everyone is different. And we can not give him a definitive length of time of when he will feel better. Unfortunately this is a risk with any type of surgery.

## 2017-01-14 NOTE — Progress Notes (Signed)
Office Visit Note   Patient: Harold Bailey           Date of Birth: Apr 01, 1966           MRN: [id number]  Visit Date: 01/14/2017              Requested by: Everardo Beals, NP [facility] [address] [place], [place] [address] PCP: Imelda Pillow, NP  No chief complaint on file.     HPI: Patient is a 51 year old gentleman who is about 4 months status post revision subtalar fusion status post previous ankle fusion on the left. Patient complains of pain with start up pain with ambulation along the plantar fascia also complains of pain along the fibula.  Assessment & Plan: Visit Diagnoses:  1. Pain in left ankle and joints of left foot   2. Plantar fasciitis of left foot     Plan: Patient's plantar fascia was injected without complications. We'll place him in a short leg cast and follow-up in the office in 2 weeks.  Follow-Up Instructions: Return in about 2 weeks (around 01/28/2017).   Ortho Exam  Patient is alert, oriented, no adenopathy, well-dressed, normal affect, normal respiratory effort. On examination patient is tender to palpation over the origin of the plantar fascia. He also has some tenderness to palpation along the fibula. Radiographs shows a stable ankle and subtalar fusion. There is no redness no cellulitis no signs of infection he does have varicose veins but there is no swelling.  Imaging: No results found.  Labs: Lab Results  Component Value Date   HGBA1C 5.9 (H) 11/22/2013   HGBA1C 5.5 08/12/2013   REPTSTATUS 01/06/2013 FINAL 01/01/2013   REPTSTATUS 01/04/2013 FINAL 01/01/2013   GRAMSTAIN  01/01/2013    RARE WBC PRESENT, PREDOMINANTLY PMN RARE SQUAMOUS EPITHELIAL CELLS PRESENT RARE GRAM POSITIVE COCCI IN PAIRS   GRAMSTAIN  01/01/2013    RARE WBC PRESENT, PREDOMINANTLY PMN RARE SQUAMOUS EPITHELIAL CELLS PRESENT RARE GRAM POSITIVE COCCI IN PAIRS   CULT NO ANAEROBES ISOLATED 01/01/2013   CULT  01/01/2013    FEW  METHICILLIN RESISTANT STAPHYLOCOCCUS AUREUS Note: RIFAMPIN AND GENTAMICIN SHOULD NOT BE USED AS SINGLE DRUGS FOR TREATMENT OF STAPH INFECTIONS. This organism DOES NOT demonstrate inducible Clindamycin resistance in vitro. CRITICAL RESULT CALLED TO, READ BACK BY AND VERIFIED WITH: SYLIVA H 4/14  @930  BY REAMM   LABORGA METHICILLIN RESISTANT STAPHYLOCOCCUS AUREUS 01/01/2013    Orders:  No orders of the defined types were placed in this encounter.  No orders of the defined types were placed in this encounter.    Procedures: Foot Inj Date/Time: 01/14/2017 12:52 PM Performed by: Leilany Digeronimo V Authorized by: [name]   Consent Given by:  Patient Site marked: the procedure site was marked   Timeout: prior to procedure the correct patient, procedure, and site was verified   Indications:  Fasciitis and pain Condition: Plantar Fasciitis   Location: left plantar fascia muscle   Prep: patient was prepped and draped in usual sterile fashion   Needle Size:  22 G Medications:  2 mL lidocaine 1 %; 40 mg methylPREDNISolone acetate 40 MG/ML Patient Tolerance:  Patient tolerated the procedure well with no immediate complications     Clinical Data: No additional findings.  ROS:  All other systems negative, except as noted in the HPI. Review of Systems  Objective: Vital Signs: There were no vitals taken for this visit.  Specialty Comments:  No specialty comments available.  PMFS History: Patient Active Problem List   Diagnosis Date Noted  . Plantar fasciitis of left foot 01/14/2017  . Traumatic arthritis of ankle, right 01/07/2017  . H/O ankle fusion 11/14/2016  . 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 At risk for adverse drug event 10/08/2016  . History of snoring 10/08/2016  . Subtalar joint instability, left   . 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 COPD with exacerbation ([place]) 07/02/2016  . History of alcohol abuse 07/02/2016  . COPD exacerbation  ([place]) 07/02/2016  . Tobacco dependence 06/06/2015  . Medication overuse headache 02/28/2015  . Localization-related symptomatic epilepsy and epileptic syndromes with complex partial seizures, not intractable, without status epilepticus ([place]) 02/28/2015  . Cognitive impairment 02/28/2015  . 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 Tobacco abuse 02/28/2015  . Brain tumor ([place]) 11/19/2013  . Seizures ([place]) 11/19/2013  . Memory loss   . HA (headache)   . Gait disturbance   . Pain in left ankle and joints of left foot 10/15/2013  . Falls frequently 10/15/2013  . Swelling 09/09/2013  . Rash and nonspecific skin eruption 09/09/2013  . Ankle fracture, lateral malleolus, closed 09/03/2013  . Unspecified vitamin D deficiency 08/12/2013  . Polycythemia   . Memory deficit   . History of drug abuse   . GERD (gastroesophageal reflux disease)   . Anxiety   . Arthritis   . Hyperlipidemia   . Prediabetes    Past Medical History:  Diagnosis Date  . Anxiety   . Arthritis    Left ankle  . COPD (chronic obstructive pulmonary disease) ([place])   . Drug abuse   . Gait disturbance   . GERD (gastroesophageal reflux disease)   . 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  Marland Kitchen Hyperlipidemia   . Hypertension   . 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 Memory loss   . Peripheral vision loss    Left due to brain surgery  . Pneumonia   . Polycythemia   . Prediabetes   . Seizures ([place]) 11/19/2013   none since 2016  . Shortness of breath    Hx of smoking.    Family History  Problem Relation Age of Onset  . Hypertension Mother   . Bronchitis Mother   . Cancer Maternal Aunt   . Cancer Maternal Grandmother   . Stroke Neg Hx   . Diabetes Neg Hx     Past Surgical History:  Procedure Laterality Date  . ANKLE ARTHROSCOPY  01/17/2012   Procedure: ANKLE ARTHROSCOPY;  Surgeon: [name], MD;  Location: [facility];  Service: Orthopedics;   Laterality: Left;  . ANKLE ARTHROSCOPY WITH FUSION Left 10/02/2016   Procedure: Left Posterior Subtalar Arthrodesis Arthroscopy;  Surgeon: [name], MD;  Location: [facility];  Service: Orthopedics;  Laterality: Left;  . ANKLE FUSION Left 11/2012   Dr Sharol Given  . ANKLE SURGERY  2003   Left  . BRAIN SURGERY  2009   Biopsy  . FRACTURE SURGERY  2003   Left ankle  . HARDWARE REMOVAL Left 12/08/2012   Procedure: HARDWARE REMOVAL;  Surgeon: [name], MD;  Location: [facility];  Service: Orthopedics;  Laterality: Left;  Removal Deep Hardware, Take Down Non-Union, Revision Internal Fixation Left Ankle  . I&D EXTREMITY Left 01/01/2013   Procedure: IRRIGATION AND DEBRIDEMENT EXTREMITY;  Surgeon: [name], MD;  Location: [facility];  Service: Orthopedics;  Laterality:  Left;  Irrigation, Debridement and Placement Antibiotic Beads Left Ankle  . INNER EAR SURGERY    . OPEN REDUCTION INTERNAL FIXATION (ORIF) SCAPHOID WITH DISTAL RADIUS GRAFT Right 02/01/2013   Procedure: OPEN REDUCTION INTERNAL FIXATION (ORIF) RIGHT SCAPHOID FRACTURE WITH DISTAL RADIUS GRAFT;  Surgeon: Schuyler Amor, MD;  Location: [facility];  Service: Orthopedics;  Laterality: Right;  . ORIF ANKLE FRACTURE Left 12/08/2012   Procedure: OPEN REDUCTION INTERNAL FIXATION (ORIF) ANKLE FRACTURE;  Surgeon: [name], MD;  Location: [facility];  Service: Orthopedics;  Laterality: Left;  Removal Deep Hardware, Take Down Non-Union, Revision Internal Fixation Left Ankle  . ORIF ANKLE FRACTURE Right 09/03/2013   Procedure: OPEN REDUCTION INTERNAL FIXATION (ORIF) ANKLE FRACTURE;  Surgeon: [name], MD;  Location: [facility];  Service: Orthopedics;  Laterality: Right;  Open Reduction Internal Fixation Right Fibula  . SCAPHOID FRACTURE SURGERY  02/04/2013  . SHOULDER ARTHROSCOPY  2008   Left  . TYMPANOSTOMY TUBE PLACEMENT  1993   Social History   Occupational History  .  Not Employed    Disabled   Social History Main Topics  . Smoking status: Current  Every Day Smoker    Packs/day: 0.25    Years: 30.00    Types: Cigarettes  . Smokeless tobacco: Never Used  . Alcohol use No     Comment: quit Oct. 2017(heavy drinker) .In [place]  . Drug use: No  . Sexual activity: Not Currently

## 2017-01-14 NOTE — Telephone Encounter (Signed)
Pt called again and stated Dr. Rogelia Boga fax number is [phone] He said you should know what he is talking about.  Requested a call back

## 2017-01-20 ENCOUNTER — Telehealth (INDEPENDENT_AMBULATORY_CARE_PROVIDER_SITE_OTHER): Payer: Self-pay | Admitting: Orthopedic Surgery

## 2017-01-20 NOTE — Telephone Encounter (Signed)
I called and pt gave number for one attorney Junious Dresser fax # [phone]. Advised that he also had to apply electrical duct tape to his cast because the bottom of it "fell out"  Advised that we could not allow it to remain this was and made an appt for him to come in tomorrow at 2:30 this was a non weight bearing cast with a post op shoe.

## 2017-01-20 NOTE — Telephone Encounter (Signed)
Patient called asking if the documents from his last visit was faxed to his attorney. CB # [phone]

## 2017-01-21 ENCOUNTER — Ambulatory Visit (INDEPENDENT_AMBULATORY_CARE_PROVIDER_SITE_OTHER): Payer: Medicare Other | Admitting: Orthopedic Surgery

## 2017-01-21 DIAGNOSIS — M25572 Pain in left ankle and joints of left foot: Secondary | ICD-10-CM

## 2017-01-21 NOTE — Progress Notes (Signed)
Office Visit Note   Patient: Harold Bailey           Date of Birth: 11-Jun-1966           MRN: [id number] Visit Date: 01/21/2017              Requested by: Everardo Beals, NP [facility] [place], [place] [address] PCP: Imelda Pillow, NP  No chief complaint on file.     HPI: Patient has been having pain from his ankle and subtalar fusion he was placed in a short leg cast this got wet and macerated and cast is removed today about 5 days after initially putting it on. Patient denies any pain at this time.  Assessment & Plan: Visit Diagnoses:  1. Pain in left ankle and joints of left foot     Plan: Patient has made interval progress with wearing the cast for 5 days we will place him back in a cast he will try to keep this dry and stated for 2 weeks.  Follow-Up Instructions: Return in about 2 weeks (around 02/04/2017).   Ortho Exam  Patient is alert, oriented, no adenopathy, well-dressed, normal affect, normal respiratory effort. Examination there is some maceration of the heel pad secondary to the wet cast it is not very bad. He has no tenderness to palpation around the subtalar joint or ankle. This is significant difference from previous there are no ulcers no blisterscellulitis no swelling.  Imaging: No results found.  Labs: Lab Results  Component Value Date   HGBA1C 5.9 (H) 11/22/2013   HGBA1C 5.5 08/12/2013   REPTSTATUS 01/06/2013 FINAL 01/01/2013   REPTSTATUS 01/04/2013 FINAL 01/01/2013   GRAMSTAIN  01/01/2013    RARE WBC PRESENT, PREDOMINANTLY PMN RARE SQUAMOUS EPITHELIAL CELLS PRESENT RARE GRAM POSITIVE COCCI IN PAIRS   GRAMSTAIN  01/01/2013    RARE WBC PRESENT, PREDOMINANTLY PMN RARE SQUAMOUS EPITHELIAL CELLS PRESENT RARE GRAM POSITIVE COCCI IN PAIRS   CULT NO ANAEROBES ISOLATED 01/01/2013   CULT  01/01/2013    FEW METHICILLIN RESISTANT STAPHYLOCOCCUS AUREUS Note: RIFAMPIN AND GENTAMICIN SHOULD NOT BE USED AS  SINGLE DRUGS FOR TREATMENT OF STAPH INFECTIONS. This organism DOES NOT demonstrate inducible Clindamycin resistance in vitro. CRITICAL RESULT CALLED TO, READ BACK BY AND VERIFIED WITH: SYLIVA H 4/14  @930  BY REAMM   LABORGA METHICILLIN RESISTANT STAPHYLOCOCCUS AUREUS 01/01/2013    Orders:  No orders of the defined types were placed in this encounter.  No orders of the defined types were placed in this encounter.    Procedures: No procedures performed  Clinical Data: No additional findings.  ROS:  All other systems negative, except as noted in the HPI. Review of Systems  Objective: Vital Signs: There were no vitals taken for this visit.  Specialty Comments:  No specialty comments available.  PMFS History: Patient Active Problem List   Diagnosis Date Noted  . Plantar fasciitis of left foot 01/14/2017  . Traumatic arthritis of ankle, right 01/07/2017  . H/O ankle fusion 11/14/2016  . 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 At risk for adverse drug event 10/08/2016  . History of snoring 10/08/2016  . Subtalar joint instability, left   . 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 COPD with exacerbation ([place]) 07/02/2016  . History of alcohol abuse 07/02/2016  . COPD exacerbation ([place]) 07/02/2016  . Tobacco dependence 06/06/2015  . Medication overuse headache 02/28/2015  . Localization-related symptomatic epilepsy and epileptic syndromes with complex  partial seizures, not intractable, without status epilepticus ([place]) 02/28/2015  . Cognitive impairment 02/28/2015  . 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 Tobacco abuse 02/28/2015  . Brain tumor ([place]) 11/19/2013  . Seizures ([place]) 11/19/2013  . Memory loss   . HA (headache)   . Gait disturbance   . Pain in left ankle and joints of left foot 10/15/2013  . Falls frequently 10/15/2013  . Swelling 09/09/2013  . Rash and nonspecific skin eruption 09/09/2013  . Ankle fracture, lateral malleolus, closed 09/03/2013    . Unspecified vitamin D deficiency 08/12/2013  . Polycythemia   . Memory deficit   . History of drug abuse   . GERD (gastroesophageal reflux disease)   . Anxiety   . Arthritis   . Hyperlipidemia   . Prediabetes    Past Medical History:  Diagnosis Date  . Anxiety   . Arthritis    Left ankle  . COPD (chronic obstructive pulmonary disease) ([place])   . Drug abuse   . Gait disturbance   . GERD (gastroesophageal reflux disease)   . 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  Marland Kitchen Hyperlipidemia   . Hypertension   . 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 Memory loss   . Peripheral vision loss    Left due to brain surgery  . Pneumonia   . Polycythemia   . Prediabetes   . Seizures ([place]) 11/19/2013   none since 2016  . Shortness of breath    Hx of smoking.    Family History  Problem Relation Age of Onset  . Hypertension Mother   . Bronchitis Mother   . Cancer Maternal Aunt   . Cancer Maternal Grandmother   . Stroke Neg Hx   . Diabetes Neg Hx     Past Surgical History:  Procedure Laterality Date  . ANKLE ARTHROSCOPY  01/17/2012   Procedure: ANKLE ARTHROSCOPY;  Surgeon: [name], MD;  Location: [place];  Service: Orthopedics;  Laterality: Left;  . ANKLE ARTHROSCOPY WITH FUSION Left 10/02/2016   Procedure: Left Posterior Subtalar Arthrodesis Arthroscopy;  Surgeon: [name], MD;  Location: [place];  Service: Orthopedics;  Laterality: Left;  . ANKLE FUSION Left 11/2012   Dr Sharol Given  . ANKLE SURGERY  2003   Left  . BRAIN SURGERY  2009   Biopsy  . FRACTURE SURGERY  2003   Left ankle  . HARDWARE REMOVAL Left 12/08/2012   Procedure: HARDWARE REMOVAL;  Surgeon: [name], MD;  Location: [place];  Service: Orthopedics;  Laterality: Left;  Removal Deep Hardware, Take Down Non-Union, Revision Internal Fixation Left Ankle  . I&D EXTREMITY Left 01/01/2013   Procedure: IRRIGATION AND DEBRIDEMENT EXTREMITY;  Surgeon:  [name], MD;  Location: [place];  Service: Orthopedics;  Laterality: Left;  Irrigation, Debridement and Placement Antibiotic Beads Left Ankle  . INNER EAR SURGERY    . OPEN REDUCTION INTERNAL FIXATION (ORIF) SCAPHOID WITH DISTAL RADIUS GRAFT Right 02/01/2013   Procedure: OPEN REDUCTION INTERNAL FIXATION (ORIF) RIGHT SCAPHOID FRACTURE WITH DISTAL RADIUS GRAFT;  Surgeon: Schuyler Amor, MD;  Location: [place];  Service: Orthopedics;  Laterality: Right;  . ORIF ANKLE FRACTURE Left 12/08/2012   Procedure: OPEN REDUCTION INTERNAL FIXATION (ORIF) ANKLE FRACTURE;  Surgeon: [name], MD;  Location: [place];  Service: Orthopedics;  Laterality: Left;  Removal Deep Hardware, Take Down Non-Union, Revision Internal Fixation Left Ankle  . ORIF ANKLE  FRACTURE Right 09/03/2013   Procedure: OPEN REDUCTION INTERNAL FIXATION (ORIF) ANKLE FRACTURE;  Surgeon: [name], MD;  Location: [place];  Service: Orthopedics;  Laterality: Right;  Open Reduction Internal Fixation Right Fibula  . SCAPHOID FRACTURE SURGERY  02/04/2013  . SHOULDER ARTHROSCOPY  2008   Left  . TYMPANOSTOMY TUBE PLACEMENT  1993   Social History   Occupational History  .  Not Employed    Disabled   Social History Main Topics  . Smoking status: Current Every Day Smoker    Packs/day: 0.25    Years: 30.00    Types: Cigarettes  . Smokeless tobacco: Never Used  . Alcohol use No     Comment: quit Oct. 2017(heavy drinker) .In [place]  . Drug use: No  . Sexual activity: Not Currently

## 2017-01-22 ENCOUNTER — Telehealth (INDEPENDENT_AMBULATORY_CARE_PROVIDER_SITE_OTHER): Payer: Self-pay

## 2017-01-22 NOTE — Telephone Encounter (Signed)
Patient wanted to let you know that you did a good job on his cast on yesterday.  Stated that he really appreciates it.  Thank You.

## 2017-01-27 ENCOUNTER — Ambulatory Visit (INDEPENDENT_AMBULATORY_CARE_PROVIDER_SITE_OTHER): Payer: Medicare Other | Admitting: Orthopedic Surgery

## 2017-01-28 ENCOUNTER — Ambulatory Visit (INDEPENDENT_AMBULATORY_CARE_PROVIDER_SITE_OTHER): Payer: Medicare Other | Admitting: Orthopedic Surgery

## 2017-02-04 ENCOUNTER — Ambulatory Visit (INDEPENDENT_AMBULATORY_CARE_PROVIDER_SITE_OTHER): Payer: Medicare Other | Admitting: Orthopedic Surgery

## 2017-02-04 ENCOUNTER — Encounter (INDEPENDENT_AMBULATORY_CARE_PROVIDER_SITE_OTHER): Payer: Self-pay | Admitting: Orthopedic Surgery

## 2017-02-04 DIAGNOSIS — M25571 Pain in right ankle and joints of right foot: Secondary | ICD-10-CM

## 2017-02-04 DIAGNOSIS — Z981 Arthrodesis status: Secondary | ICD-10-CM

## 2017-02-04 MED ORDER — VARENICLINE TARTRATE 0.5 MG PO TABS: 0.5000 mg | ORAL_TABLET | Freq: Two times a day (BID) | ORAL | 1 refills | Status: DC

## 2017-02-04 NOTE — Progress Notes (Signed)
Office Visit Note   Patient: Harold Bailey           Date of Birth: Dec 15, 1965           MRN: [id number] Visit Date: 02/04/2017              Requested by: Everardo Beals, NP [facility] [address] [place], [place] [address] PCP: Everardo Beals, NP  Chief Complaint  Patient presents with  . Left Ankle - Follow-up    10/02/16 Left Posterior Subtalar Arthrodesis Arthroscopy 4 months post op      HPI: Patient is 4 months status post revision subtalar fusion. Patient states that he feels like the cast is breaking down he is aggravated about his continued smoking would like to consider Chantix. Patient feels like his pain has improved with using the cast.  Assessment & Plan: Visit Diagnoses:  1. Pain in right ankle and joints of right foot   2. History of ankle fusion     Plan: We will reinforce the cast follow-up in 2 weeks to change him into a new cast. A prescription was called in for Chantix to take 0.5 mg twice a day. Again we discussed the importance of smoking cessation.  Follow-Up Instructions: Return in about 2 weeks (around 02/18/2017).   Ortho Exam  Patient is alert, oriented, no adenopathy, well-dressed, normal affect, normal respiratory effort. Examination there is some slight breakdown of the plantar aspect of the cast made of the cast is in good condition there were no ulcers or blisters around the top or bottom edges of the cast. Patient is able to ambulate without problem. He is not using the cast shoe.  Imaging: No results found.  Labs: Lab Results  Component Value Date   HGBA1C 5.9 (H) 11/22/2013   HGBA1C 5.5 08/12/2013   REPTSTATUS 01/06/2013 FINAL 01/01/2013   REPTSTATUS 01/04/2013 FINAL 01/01/2013   GRAMSTAIN  01/01/2013    RARE WBC PRESENT, PREDOMINANTLY PMN RARE SQUAMOUS EPITHELIAL CELLS PRESENT RARE GRAM POSITIVE COCCI IN PAIRS   GRAMSTAIN  01/01/2013    RARE WBC PRESENT, PREDOMINANTLY PMN RARE SQUAMOUS  EPITHELIAL CELLS PRESENT RARE GRAM POSITIVE COCCI IN PAIRS   CULT NO ANAEROBES ISOLATED 01/01/2013   CULT  01/01/2013    FEW METHICILLIN RESISTANT STAPHYLOCOCCUS AUREUS Note: RIFAMPIN AND GENTAMICIN SHOULD NOT BE USED AS SINGLE DRUGS FOR TREATMENT OF STAPH INFECTIONS. This organism DOES NOT demonstrate inducible Clindamycin resistance in vitro. CRITICAL RESULT CALLED TO, READ BACK BY AND VERIFIED WITH: SYLIVA H 4/14  @930  BY REAMM   LABORGA METHICILLIN RESISTANT STAPHYLOCOCCUS AUREUS 01/01/2013    Orders:  No orders of the defined types were placed in this encounter.  No orders of the defined types were placed in this encounter.    Procedures: No procedures performed  Clinical Data: No additional findings.  ROS:  All other systems negative, except as noted in the HPI. Review of Systems  Objective: Vital Signs: There were no vitals taken for this visit.  Specialty Comments:  No specialty comments available.  PMFS History: Patient Active Problem List   Diagnosis Date Noted  . Plantar fasciitis of left foot 01/14/2017  . Traumatic arthritis of ankle, right 01/07/2017  . H/O ankle fusion 11/14/2016  . 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 At risk for adverse drug event 10/08/2016  . History of snoring 10/08/2016  . Subtalar joint instability, left   . 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 COPD with  exacerbation ([place]) 07/02/2016  . History of alcohol abuse 07/02/2016  . COPD exacerbation ([place]) 07/02/2016  . Tobacco dependence 06/06/2015  . Medication overuse headache 02/28/2015  . Localization-related symptomatic epilepsy and epileptic syndromes with complex partial seizures, not intractable, without status epilepticus ([place]) 02/28/2015  . Cognitive impairment 02/28/2015  . 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 Tobacco abuse 02/28/2015  . Brain tumor ([place]) 11/19/2013  . Seizures ([place]) 11/19/2013  . Memory loss   . HA (headache)   . Gait  disturbance   . Pain in left ankle and joints of left foot 10/15/2013  . Falls frequently 10/15/2013  . Swelling 09/09/2013  . Rash and nonspecific skin eruption 09/09/2013  . Ankle fracture, lateral malleolus, closed 09/03/2013  . Unspecified vitamin D deficiency 08/12/2013  . Polycythemia   . Memory deficit   . History of drug abuse   . GERD (gastroesophageal reflux disease)   . Anxiety   . Arthritis   . Hyperlipidemia   . Prediabetes    Past Medical History:  Diagnosis Date  . Anxiety   . Arthritis    Left ankle  . COPD (chronic obstructive pulmonary disease) ([place])   . Drug abuse   . Gait disturbance   . GERD (gastroesophageal reflux disease)   . 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  Marland Kitchen Hyperlipidemia   . Hypertension   . 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 Memory loss   . Peripheral vision loss    Left due to brain surgery  . Pneumonia   . Polycythemia   . Prediabetes   . Seizures ([place]) 11/19/2013   none since 2016  . Shortness of breath    Hx of smoking.    Family History  Problem Relation Age of Onset  . Hypertension Mother   . Bronchitis Mother   . Cancer Maternal Aunt   . Cancer Maternal Grandmother   . Stroke Neg Hx   . Diabetes Neg Hx     Past Surgical History:  Procedure Laterality Date  . ANKLE ARTHROSCOPY  01/17/2012   Procedure: ANKLE ARTHROSCOPY;  Surgeon: [name], MD;  Location: [place];  Service: Orthopedics;  Laterality: Left;  . ANKLE ARTHROSCOPY WITH FUSION Left 10/02/2016   Procedure: Left Posterior Subtalar Arthrodesis Arthroscopy;  Surgeon: [name], MD;  Location: [place];  Service: Orthopedics;  Laterality: Left;  . ANKLE FUSION Left 11/2012   Dr Sharol Given  . ANKLE SURGERY  2003   Left  . BRAIN SURGERY  2009   Biopsy  . FRACTURE SURGERY  2003   Left ankle  . HARDWARE REMOVAL Left 12/08/2012   Procedure: HARDWARE REMOVAL;  Surgeon: [name], MD;   Location: [place];  Service: Orthopedics;  Laterality: Left;  Removal Deep Hardware, Take Down Non-Union, Revision Internal Fixation Left Ankle  . I&D EXTREMITY Left 01/01/2013   Procedure: IRRIGATION AND DEBRIDEMENT EXTREMITY;  Surgeon: [name], MD;  Location: [place];  Service: Orthopedics;  Laterality: Left;  Irrigation, Debridement and Placement Antibiotic Beads Left Ankle  . INNER EAR SURGERY    . OPEN REDUCTION INTERNAL FIXATION (ORIF) SCAPHOID WITH DISTAL RADIUS GRAFT Right 02/01/2013   Procedure: OPEN REDUCTION INTERNAL FIXATION (ORIF) RIGHT SCAPHOID FRACTURE WITH DISTAL RADIUS GRAFT;  Surgeon: Schuyler Amor, MD;  Location: [place];  Service: Orthopedics;  Laterality: Right;  . ORIF ANKLE FRACTURE Left 12/08/2012   Procedure: OPEN REDUCTION INTERNAL  FIXATION (ORIF) ANKLE FRACTURE;  Surgeon: [name], MD;  Location: [place];  Service: Orthopedics;  Laterality: Left;  Removal Deep Hardware, Take Down Non-Union, Revision Internal Fixation Left Ankle  . ORIF ANKLE FRACTURE Right 09/03/2013   Procedure: OPEN REDUCTION INTERNAL FIXATION (ORIF) ANKLE FRACTURE;  Surgeon: [name], MD;  Location: [place];  Service: Orthopedics;  Laterality: Right;  Open Reduction Internal Fixation Right Fibula  . SCAPHOID FRACTURE SURGERY  02/04/2013  . SHOULDER ARTHROSCOPY  2008   Left  . TYMPANOSTOMY TUBE PLACEMENT  1993   Social History   Occupational History  .  Not Employed    Disabled   Social History Main Topics  . Smoking status: Current Every Day Smoker    Packs/day: 0.25    Years: 30.00    Types: Cigarettes  . Smokeless tobacco: Never Used  . Alcohol use No     Comment: quit Oct. 2017(heavy drinker) .In [place]  . Drug use: No  . Sexual activity: Not Currently

## 2017-02-05 ENCOUNTER — Other Ambulatory Visit (INDEPENDENT_AMBULATORY_CARE_PROVIDER_SITE_OTHER): Payer: Self-pay | Admitting: Family

## 2017-02-05 ENCOUNTER — Telehealth (INDEPENDENT_AMBULATORY_CARE_PROVIDER_SITE_OTHER): Payer: Self-pay | Admitting: Radiology

## 2017-02-05 MED ORDER — BUPROPION HCL ER (SMOKING DET) 150 MG PO TB12: 150.0000 mg | ORAL_TABLET | Freq: Two times a day (BID) | ORAL | 1 refills | Status: DC

## 2017-02-05 NOTE — Telephone Encounter (Signed)
Patients chantix prescription is denied by insurance. Bupropion HCI ER (smoking det) is normally approved. Please advise.

## 2017-02-06 NOTE — Telephone Encounter (Signed)
I called patient and advised him that he will need to discontinue wellbutrin after two weeks.

## 2017-02-07 ENCOUNTER — Other Ambulatory Visit: Payer: Self-pay | Admitting: Nurse Practitioner

## 2017-02-07 DIAGNOSIS — J449 Chronic obstructive pulmonary disease, unspecified: Secondary | ICD-10-CM

## 2017-02-10 ENCOUNTER — Telehealth (INDEPENDENT_AMBULATORY_CARE_PROVIDER_SITE_OTHER): Payer: Self-pay | Admitting: Orthopedic Surgery

## 2017-02-10 NOTE — Telephone Encounter (Signed)
Patient called advised his Lt ankle is not getting any better. Patient said he is in a lot of pain. The number to contact patient is  [phone]

## 2017-02-11 ENCOUNTER — Other Ambulatory Visit (INDEPENDENT_AMBULATORY_CARE_PROVIDER_SITE_OTHER): Payer: Self-pay | Admitting: Orthopedic Surgery

## 2017-02-11 MED ORDER — HYDROCODONE-ACETAMINOPHEN 5-325 MG PO TABS: 1.0000 | ORAL_TABLET | Freq: Four times a day (QID) | ORAL | 0 refills | Status: DC | PRN

## 2017-02-11 NOTE — Telephone Encounter (Signed)
Prescription written patient needs to maintain using the cast. Follow-up to replace the cast.

## 2017-02-11 NOTE — Telephone Encounter (Signed)
I called and spoke with patient to advise hydrocodone is at the front desk and cast can be replaced if necessary. He states he is feeling better today.

## 2017-02-18 ENCOUNTER — Ambulatory Visit (INDEPENDENT_AMBULATORY_CARE_PROVIDER_SITE_OTHER): Payer: Medicare Other | Admitting: Orthopedic Surgery

## 2017-02-18 ENCOUNTER — Encounter (INDEPENDENT_AMBULATORY_CARE_PROVIDER_SITE_OTHER): Payer: Self-pay | Admitting: Family

## 2017-02-18 VITALS — Ht 72.0 in | Wt 248.0 lb

## 2017-02-18 DIAGNOSIS — M25571 Pain in right ankle and joints of right foot: Secondary | ICD-10-CM | POA: Diagnosis not present

## 2017-02-18 NOTE — Progress Notes (Signed)
Office Visit Note   Patient: Harold Bailey           Date of Birth: 10-15-1965           MRN: [id number] Visit Date: 02/18/2017              Requested by: Everardo Beals, NP [facility] [address] [place], [place] [address] PCP: Everardo Beals, NP  Chief Complaint  Patient presents with  . Left Ankle - Follow-up    10/02/16 Left posterior subtalar arthrodesis arthrosopy      HPI: Patient is status post subtalar fusion he was placed in a cast. Patient states the sharp shooting pain has decreased he states he still has a little pain under the heel pad. He is trying to stop smoking he states that she had takes was denied he has been tried on some Wellbutrin and this seems to help he is also taking Vicodin for pain.  Assessment & Plan: Visit Diagnoses:  1. Pain in right ankle and joints of right foot     Plan: We will have him wear his supportive ankle brace with a 916 since heel left and his boots. Continue with smoking cessation activities as tolerated follow-up in 4 weeks  Follow-Up Instructions: Return in about 4 weeks (around 03/18/2017).   Ortho Exam  Patient is alert, oriented, no adenopathy, well-dressed, normal affect, normal respiratory effort. Examination patient has no skin breakdown his foot is plantar grade there is no pain to palpation over the sinus Tarsi no pain across the ankle or subtalar joint. There is no pain to palpation on the plantar fascia.  Imaging: No results found.  Labs: Lab Results  Component Value Date   HGBA1C 5.9 (H) 11/22/2013   HGBA1C 5.5 08/12/2013   REPTSTATUS 01/06/2013 FINAL 01/01/2013   REPTSTATUS 01/04/2013 FINAL 01/01/2013   GRAMSTAIN  01/01/2013    RARE WBC PRESENT, PREDOMINANTLY PMN RARE SQUAMOUS EPITHELIAL CELLS PRESENT RARE GRAM POSITIVE COCCI IN PAIRS   GRAMSTAIN  01/01/2013    RARE WBC PRESENT, PREDOMINANTLY PMN RARE SQUAMOUS EPITHELIAL CELLS PRESENT RARE GRAM POSITIVE COCCI IN PAIRS   CULT NO ANAEROBES ISOLATED 01/01/2013   CULT  01/01/2013    FEW METHICILLIN RESISTANT STAPHYLOCOCCUS AUREUS Note: RIFAMPIN AND GENTAMICIN SHOULD NOT BE USED AS SINGLE DRUGS FOR TREATMENT OF STAPH INFECTIONS. This organism DOES NOT demonstrate inducible Clindamycin resistance in vitro. CRITICAL RESULT CALLED TO, READ BACK BY AND VERIFIED WITH: SYLIVA H 4/14  @930  BY REAMM   LABORGA METHICILLIN RESISTANT STAPHYLOCOCCUS AUREUS 01/01/2013    Orders:  No orders of the defined types were placed in this encounter.  No orders of the defined types were placed in this encounter.    Procedures: No procedures performed  Clinical Data: No additional findings.  ROS:  All other systems negative, except as noted in the HPI. Review of Systems  Objective: Vital Signs: Ht 6' (1.829 m)   Wt 248 lb (112.5 kg)   BMI 33.63 kg/m   Specialty Comments:  No specialty comments available.  PMFS History: Patient Active Problem List   Diagnosis Date Noted  . Plantar fasciitis of left foot 01/14/2017  . Traumatic arthritis of ankle, right 01/07/2017  . H/O ankle fusion 11/14/2016  . 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 At risk for adverse drug event 10/08/2016  . History of snoring 10/08/2016  . Subtalar joint instability, left   . 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 COPD with exacerbation (  [place]) 07/02/2016  . History of alcohol abuse 07/02/2016  . COPD exacerbation ([place]) 07/02/2016  . Tobacco dependence 06/06/2015  . Medication overuse headache 02/28/2015  . Localization-related symptomatic epilepsy and epileptic syndromes with complex partial seizures, not intractable, without status epilepticus ([place]) 02/28/2015  . Cognitive impairment 02/28/2015  . 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 Tobacco abuse 02/28/2015  . Brain tumor ([place]) 11/19/2013  . Seizures ([place]) 11/19/2013  . Memory loss   . HA (headache)   . Gait disturbance   . Pain in left ankle and joints of left  foot 10/15/2013  . Falls frequently 10/15/2013  . Swelling 09/09/2013  . Rash and nonspecific skin eruption 09/09/2013  . Ankle fracture, lateral malleolus, closed 09/03/2013  . Unspecified vitamin D deficiency 08/12/2013  . Polycythemia   . Memory deficit   . History of drug abuse   . GERD (gastroesophageal reflux disease)   . Anxiety   . Arthritis   . Hyperlipidemia   . Prediabetes    Past Medical History:  Diagnosis Date  . Anxiety   . Arthritis    Left ankle  . COPD (chronic obstructive pulmonary disease) ([place])   . Drug abuse   . Gait disturbance   . GERD (gastroesophageal reflux disease)   . 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  Marland Kitchen Hyperlipidemia   . Hypertension   . 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 Memory loss   . Peripheral vision loss    Left due to brain surgery  . Pneumonia   . Polycythemia   . Prediabetes   . Seizures ([place]) 11/19/2013   none since 2016  . Shortness of breath    Hx of smoking.    Family History  Problem Relation Age of Onset  . Hypertension Mother   . Bronchitis Mother   . Cancer Maternal Aunt   . Cancer Maternal Grandmother   . Stroke Neg Hx   . Diabetes Neg Hx     Past Surgical History:  Procedure Laterality Date  . ANKLE ARTHROSCOPY  01/17/2012   Procedure: ANKLE ARTHROSCOPY;  Surgeon: [name], MD;  Location: [facility];  Service: Orthopedics;  Laterality: Left;  . ANKLE ARTHROSCOPY WITH FUSION Left 10/02/2016   Procedure: Left Posterior Subtalar Arthrodesis Arthroscopy;  Surgeon: [name], MD;  Location: [place];  Service: Orthopedics;  Laterality: Left;  . ANKLE FUSION Left 11/2012   Dr Sharol Given  . ANKLE SURGERY  2003   Left  . BRAIN SURGERY  2009   Biopsy  . FRACTURE SURGERY  2003   Left ankle  . HARDWARE REMOVAL Left 12/08/2012   Procedure: HARDWARE REMOVAL;  Surgeon: [name], MD;  Location: [place];  Service: Orthopedics;  Laterality: Left;   Removal Deep Hardware, Take Down Non-Union, Revision Internal Fixation Left Ankle  . I&D EXTREMITY Left 01/01/2013   Procedure: IRRIGATION AND DEBRIDEMENT EXTREMITY;  Surgeon: [name], MD;  Location: [facility];  Service: Orthopedics;  Laterality: Left;  Irrigation, Debridement and Placement Antibiotic Beads Left Ankle  . INNER EAR SURGERY    . OPEN REDUCTION INTERNAL FIXATION (ORIF) SCAPHOID WITH DISTAL RADIUS GRAFT Right 02/01/2013   Procedure: OPEN REDUCTION INTERNAL FIXATION (ORIF) RIGHT SCAPHOID FRACTURE WITH DISTAL RADIUS GRAFT;  Surgeon: Schuyler Amor, MD;  Location: [facility];  Service: Orthopedics;  Laterality: Right;  . ORIF ANKLE FRACTURE Left 12/08/2012   Procedure: OPEN REDUCTION INTERNAL FIXATION (  ORIF) ANKLE FRACTURE;  Surgeon: [name], MD;  Location: [facility];  Service: Orthopedics;  Laterality: Left;  Removal Deep Hardware, Take Down Non-Union, Revision Internal Fixation Left Ankle  . ORIF ANKLE FRACTURE Right 09/03/2013   Procedure: OPEN REDUCTION INTERNAL FIXATION (ORIF) ANKLE FRACTURE;  Surgeon: [name], MD;  Location: [facility];  Service: Orthopedics;  Laterality: Right;  Open Reduction Internal Fixation Right Fibula  . SCAPHOID FRACTURE SURGERY  02/04/2013  . SHOULDER ARTHROSCOPY  2008   Left  . TYMPANOSTOMY TUBE PLACEMENT  1993   Social History   Occupational History  .  Not Employed    Disabled   Social History Main Topics  . Smoking status: Current Every Day Smoker    Packs/day: 0.25    Years: 30.00    Types: Cigarettes  . Smokeless tobacco: Never Used  . Alcohol use No     Comment: quit Oct. 2017(heavy drinker) .In [place]  . Drug use: No  . Sexual activity: Not Currently

## 2017-03-05 ENCOUNTER — Ambulatory Visit: Payer: Medicare Other | Admitting: Neurology

## 2017-03-10 ENCOUNTER — Other Ambulatory Visit: Payer: Self-pay | Admitting: Nurse Practitioner

## 2017-03-10 DIAGNOSIS — J449 Chronic obstructive pulmonary disease, unspecified: Secondary | ICD-10-CM

## 2017-03-17 ENCOUNTER — Other Ambulatory Visit (INDEPENDENT_AMBULATORY_CARE_PROVIDER_SITE_OTHER): Payer: Self-pay | Admitting: Orthopedic Surgery

## 2017-03-17 ENCOUNTER — Other Ambulatory Visit: Payer: Self-pay | Admitting: Nurse Practitioner

## 2017-03-17 DIAGNOSIS — M25375 Other instability, left foot: Secondary | ICD-10-CM

## 2017-03-18 ENCOUNTER — Ambulatory Visit (INDEPENDENT_AMBULATORY_CARE_PROVIDER_SITE_OTHER): Payer: Medicare Other | Admitting: Orthopedic Surgery

## 2017-03-18 ENCOUNTER — Telehealth (INDEPENDENT_AMBULATORY_CARE_PROVIDER_SITE_OTHER): Payer: Self-pay | Admitting: Orthopedic Surgery

## 2017-03-18 ENCOUNTER — Encounter (INDEPENDENT_AMBULATORY_CARE_PROVIDER_SITE_OTHER): Payer: Self-pay | Admitting: Orthopedic Surgery

## 2017-03-18 VITALS — Ht 72.0 in | Wt 248.0 lb

## 2017-03-18 DIAGNOSIS — M25572 Pain in left ankle and joints of left foot: Secondary | ICD-10-CM | POA: Diagnosis not present

## 2017-03-18 MED ORDER — HYDROCODONE-ACETAMINOPHEN 5-325 MG PO TABS: 1.0000 | ORAL_TABLET | Freq: Four times a day (QID) | ORAL | 0 refills | Status: DC | PRN

## 2017-03-18 NOTE — Telephone Encounter (Signed)
I called pt and advised that Dr. Sharol Given is in agreement that he should investigate facilities that are close to where he lives or family. Pt to call and speak with facility and see what appeals to him.

## 2017-03-18 NOTE — Progress Notes (Signed)
Office Visit Note   Patient: Harold Bailey           Date of Birth: 30-Jul-1966           MRN: [id number] Visit Date: 03/18/2017              Requested by: Everardo Beals, NP [facility] [address] [place], [place] [address] PCP: Everardo Beals, NP  Chief Complaint  Patient presents with  . Left Ankle - Pain      HPI: Patient states that he fell yesterday coming down this gap. He states his ankle gave out. He states he was using a cane. He complains of pain with weightbearing on the plantar aspect of his heel he denies any ankle or foot pain. He is currently using a lace up ankle stabilizing orthosis. Patient states his foot states read.  Assessment & Plan: Visit Diagnoses:  1. Pain in left ankle and joints of left foot     Plan: Patient does have venous stasis changes recommended compression stockings knee-high. Patient is putting too much pressure on his heel and he will use 11/01/2014 since he'll list unload the heel during ambulation continue with his cane follow-up as needed prescription provided for Vicodin. Patient was given a prescription for 15-20 mm of compression knee-high compression stockings  Follow-Up Instructions: Return if symptoms worsen or fail to improve.   Ortho Exam  Patient is alert, oriented, no adenopathy, well-dressed, normal affect, normal respiratory effort. Patient does have an antalgic gait. He has no tenderness to palpation of the ankle foot or hind foot. The plantar aspect of the heel is nontender to palpation the plantar fascia is nontender to palpation the Achilles tendon is nontender to palpation. Patient's foot is neutral with him seated. Patient does have some venous stasis changes but no cellulitis no ulcers.  Imaging: No results found.  Labs: Lab Results  Component Value Date   HGBA1C 5.9 (H) 11/22/2013   HGBA1C 5.5 08/12/2013   REPTSTATUS 01/06/2013 FINAL 01/01/2013   REPTSTATUS 01/04/2013 FINAL  01/01/2013   GRAMSTAIN  01/01/2013    RARE WBC PRESENT, PREDOMINANTLY PMN RARE SQUAMOUS EPITHELIAL CELLS PRESENT RARE GRAM POSITIVE COCCI IN PAIRS   GRAMSTAIN  01/01/2013    RARE WBC PRESENT, PREDOMINANTLY PMN RARE SQUAMOUS EPITHELIAL CELLS PRESENT RARE GRAM POSITIVE COCCI IN PAIRS   CULT NO ANAEROBES ISOLATED 01/01/2013   CULT  01/01/2013    FEW METHICILLIN RESISTANT STAPHYLOCOCCUS AUREUS Note: RIFAMPIN AND GENTAMICIN SHOULD NOT BE USED AS SINGLE DRUGS FOR TREATMENT OF STAPH INFECTIONS. This organism DOES NOT demonstrate inducible Clindamycin resistance in vitro. CRITICAL RESULT CALLED TO, READ BACK BY AND VERIFIED WITH: SYLIVA H 4/14  @930  BY REAMM   LABORGA METHICILLIN RESISTANT STAPHYLOCOCCUS AUREUS 01/01/2013    Orders:  No orders of the defined types were placed in this encounter.  Meds ordered this encounter  Medications  . HYDROcodone-acetaminophen (NORCO/VICODIN) 5-325 MG tablet    Sig: Take 1 tablet by mouth every 6 (six) hours as needed for moderate pain.    Dispense:  30 tablet    Refill:  0     Procedures: No procedures performed  Clinical Data: No additional findings.  ROS:  All other systems negative, except as noted in the HPI. Review of Systems  Objective: Vital Signs: Ht 6' (1.829 m)   Wt 248 lb (112.5 kg)   BMI 33.63 kg/m   Specialty Comments:  No specialty comments available.  PMFS History: Patient Active Problem  List   Diagnosis Date Noted  . Plantar fasciitis of left foot 01/14/2017  . Traumatic arthritis of ankle, right 01/07/2017  . H/O ankle fusion 11/14/2016  . 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 At risk for adverse drug event 10/08/2016  . History of snoring 10/08/2016  . Subtalar joint instability, left   . 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 COPD with exacerbation ([place]) 07/02/2016  . History of alcohol abuse 07/02/2016  . COPD exacerbation ([place]) 07/02/2016  . Tobacco dependence 06/06/2015  .  Medication overuse headache 02/28/2015  . Localization-related symptomatic epilepsy and epileptic syndromes with complex partial seizures, not intractable, without status epilepticus ([place]) 02/28/2015  . Cognitive impairment 02/28/2015  . 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 Tobacco abuse 02/28/2015  . Brain tumor ([place]) 11/19/2013  . Seizures ([place]) 11/19/2013  . Memory loss   . HA (headache)   . Gait disturbance   . Pain in left ankle and joints of left foot 10/15/2013  . Falls frequently 10/15/2013  . Swelling 09/09/2013  . Rash and nonspecific skin eruption 09/09/2013  . Ankle fracture, lateral malleolus, closed 09/03/2013  . Unspecified vitamin D deficiency 08/12/2013  . Polycythemia   . Memory deficit   . History of drug abuse   . GERD (gastroesophageal reflux disease)   . Anxiety   . Arthritis   . Hyperlipidemia   . Prediabetes    Past Medical History:  Diagnosis Date  . Anxiety   . Arthritis    Left ankle  . COPD (chronic obstructive pulmonary disease) ([place])   . Drug abuse   . Gait disturbance   . GERD (gastroesophageal reflux disease)   . 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  Marland Kitchen Hyperlipidemia   . Hypertension   . 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 Memory loss   . Peripheral vision loss    Left due to brain surgery  . Pneumonia   . Polycythemia   . Prediabetes   . Seizures ([place]) 11/19/2013   none since 2016  . Shortness of breath    Hx of smoking.    Family History  Problem Relation Age of Onset  . Hypertension Mother   . Bronchitis Mother   . Cancer Maternal Aunt   . Cancer Maternal Grandmother   . Stroke Neg Hx   . Diabetes Neg Hx     Past Surgical History:  Procedure Laterality Date  . ANKLE ARTHROSCOPY  01/17/2012   Procedure: ANKLE ARTHROSCOPY;  Surgeon: [name], MD;  Location: [facility];  Service: Orthopedics;  Laterality: Left;  . ANKLE ARTHROSCOPY WITH FUSION Left  10/02/2016   Procedure: Left Posterior Subtalar Arthrodesis Arthroscopy;  Surgeon: [name], MD;  Location: [facility];  Service: Orthopedics;  Laterality: Left;  . ANKLE FUSION Left 11/2012   Dr Sharol Given  . ANKLE SURGERY  2003   Left  . BRAIN SURGERY  2009   Biopsy  . FRACTURE SURGERY  2003   Left ankle  . HARDWARE REMOVAL Left 12/08/2012   Procedure: HARDWARE REMOVAL;  Surgeon: [name], MD;  Location: [facility];  Service: Orthopedics;  Laterality: Left;  Removal Deep Hardware, Take Down Non-Union, Revision Internal Fixation Left Ankle  . I&D EXTREMITY Left 01/01/2013   Procedure: IRRIGATION AND DEBRIDEMENT EXTREMITY;  Surgeon: [name], MD;  Location: [facility];  Service: Orthopedics;  Laterality: Left;  Irrigation, Debridement and  Placement Antibiotic Beads Left Ankle  . INNER EAR SURGERY    . OPEN REDUCTION INTERNAL FIXATION (ORIF) SCAPHOID WITH DISTAL RADIUS GRAFT Right 02/01/2013   Procedure: OPEN REDUCTION INTERNAL FIXATION (ORIF) RIGHT SCAPHOID FRACTURE WITH DISTAL RADIUS GRAFT;  Surgeon: Schuyler Amor, MD;  Location: [facility];  Service: Orthopedics;  Laterality: Right;  . ORIF ANKLE FRACTURE Left 12/08/2012   Procedure: OPEN REDUCTION INTERNAL FIXATION (ORIF) ANKLE FRACTURE;  Surgeon: [name], MD;  Location: [facility];  Service: Orthopedics;  Laterality: Left;  Removal Deep Hardware, Take Down Non-Union, Revision Internal Fixation Left Ankle  . ORIF ANKLE FRACTURE Right 09/03/2013   Procedure: OPEN REDUCTION INTERNAL FIXATION (ORIF) ANKLE FRACTURE;  Surgeon: [name], MD;  Location: [facility];  Service: Orthopedics;  Laterality: Right;  Open Reduction Internal Fixation Right Fibula  . SCAPHOID FRACTURE SURGERY  02/04/2013  . SHOULDER ARTHROSCOPY  2008   Left  . TYMPANOSTOMY TUBE PLACEMENT  1993   Social History   Occupational History  .  Not Employed    Disabled   Social History Main Topics  . Smoking status: Current Every Day Smoker    Packs/day: 0.25    Years: 30.00     Types: Cigarettes  . Smokeless tobacco: Never Used  . Alcohol use No     Comment: quit Oct. 2017(heavy drinker) .In [place]  . Drug use: No  . Sexual activity: Not Currently

## 2017-03-18 NOTE — Telephone Encounter (Signed)
Patient called advised he is worried about falling and being alone. Patient said he is concerned he may fall and lay there for a few days without being found. Patient want to know if he need to go to a nursing home. Patient want to know how he would get set up for a home. The number to contact patient is [phone]

## 2017-03-28 ENCOUNTER — Telehealth (INDEPENDENT_AMBULATORY_CARE_PROVIDER_SITE_OTHER): Payer: Self-pay

## 2017-03-28 ENCOUNTER — Other Ambulatory Visit (INDEPENDENT_AMBULATORY_CARE_PROVIDER_SITE_OTHER): Payer: Self-pay

## 2017-03-28 ENCOUNTER — Other Ambulatory Visit (INDEPENDENT_AMBULATORY_CARE_PROVIDER_SITE_OTHER): Payer: Self-pay | Admitting: Orthopedic Surgery

## 2017-03-28 MED ORDER — NABUMETONE 500 MG PO TABS: 500.0000 mg | ORAL_TABLET | Freq: Two times a day (BID) | ORAL | 0 refills | Status: DC | PRN

## 2017-03-28 NOTE — Telephone Encounter (Signed)
Called pt to advise and said that he would like this faxed to his pharm. This has been done. Pt will call with any other questions.

## 2017-03-28 NOTE — Telephone Encounter (Signed)
We could call in Relafen for him at this time

## 2017-03-28 NOTE — Telephone Encounter (Signed)
The pt is calling for a refill. He states that his last refill was not a narcotic but in his chart I see 03/18/17 a vicodin 5/325 #30. He is asking for pain medication. What do you want to do?

## 2017-03-28 NOTE — Telephone Encounter (Signed)
Patient called wanting refill on pain medication " not narcotic" just something for pain but states he does not know what he has been given.

## 2017-04-11 ENCOUNTER — Other Ambulatory Visit (INDEPENDENT_AMBULATORY_CARE_PROVIDER_SITE_OTHER): Payer: Self-pay | Admitting: Orthopedic Surgery

## 2017-04-11 ENCOUNTER — Other Ambulatory Visit: Payer: Self-pay | Admitting: Nurse Practitioner

## 2017-04-11 ENCOUNTER — Telehealth (INDEPENDENT_AMBULATORY_CARE_PROVIDER_SITE_OTHER): Payer: Self-pay | Admitting: Orthopedic Surgery

## 2017-04-11 NOTE — Telephone Encounter (Signed)
Spoke with patient [place] and want to know if he can do light duty with his ankle the way it is. Patient asked if Dr Sharol Given can have a note faxed to [phone] if he can do light duty.  aa  AttnElta Gu[place]   The number to contact patient is [phone]

## 2017-04-14 NOTE — Telephone Encounter (Signed)
Okay for light duty work, no climbing ladders, maximum lifting 25 pounds.

## 2017-04-15 NOTE — Telephone Encounter (Signed)
I called and spoke with the patient this morning to advise a work note for light duty work, no climbing ladders, maximum lifting of 25 lbs. Advised patient this was faxed to [facility] as he had requested with the number provided [phone]

## 2017-04-18 ENCOUNTER — Telehealth (INDEPENDENT_AMBULATORY_CARE_PROVIDER_SITE_OTHER): Payer: Self-pay | Admitting: Orthopedic Surgery

## 2017-04-18 NOTE — Telephone Encounter (Signed)
PT CALLED AND ASKED FOR ANYTHING FOR PAIN THAT IS NON-NARCOTIC. HE SAID HE JUST STARTED TO WALK.  [phone]

## 2017-04-18 NOTE — Telephone Encounter (Signed)
I called and advised the pt he did receive an rx for Relafen this month and also Voltaren gel the end of last month. Pt advised that [place] is [place] the mountains and will be gone for three months and wanted to have his medication in order. He was not able to tell me if he picked up the rx for the Voltaren gel and advised that if he needed a refill on this then to call his pharm and they will contact us for refill. Pt voiced understanding and will call with any questions.

## 2017-05-06 DIAGNOSIS — I471 Supraventricular tachycardia, unspecified: Secondary | ICD-10-CM | POA: Insufficient documentation

## 2017-05-06 DIAGNOSIS — Z87898 Personal history of other specified conditions: Secondary | ICD-10-CM | POA: Insufficient documentation

## 2017-05-07 DIAGNOSIS — R9439 Abnormal result of other cardiovascular function study: Secondary | ICD-10-CM | POA: Insufficient documentation

## 2017-05-10 ENCOUNTER — Inpatient Hospital Stay (HOSPITAL_COMMUNITY)
Admission: EM | Admit: 2017-05-10 | Discharge: 2017-05-27 | DRG: 004 | Disposition: A | Discharge status: Skilled Nursing Facility | Payer: Medicare Other | Attending: Pulmonary Disease | Admitting: Pulmonary Disease

## 2017-05-10 ENCOUNTER — Encounter (HOSPITAL_COMMUNITY): Payer: Self-pay

## 2017-05-10 ENCOUNTER — Emergency Department (HOSPITAL_COMMUNITY): Payer: Medicare Other

## 2017-05-10 DIAGNOSIS — F319 Bipolar disorder, unspecified: Secondary | ICD-10-CM | POA: Diagnosis present

## 2017-05-10 DIAGNOSIS — Z789 Other specified health status: Secondary | ICD-10-CM

## 2017-05-10 DIAGNOSIS — J9600 Acute respiratory failure, unspecified whether with hypoxia or hypercapnia: Secondary | ICD-10-CM

## 2017-05-10 DIAGNOSIS — A419 Sepsis, unspecified organism: Secondary | ICD-10-CM | POA: Diagnosis not present

## 2017-05-10 DIAGNOSIS — K219 Gastro-esophageal reflux disease without esophagitis: Secondary | ICD-10-CM | POA: Diagnosis present

## 2017-05-10 DIAGNOSIS — J9622 Acute and chronic respiratory failure with hypercapnia: Secondary | ICD-10-CM | POA: Diagnosis not present

## 2017-05-10 DIAGNOSIS — Z8249 Family history of ischemic heart disease and other diseases of the circulatory system: Secondary | ICD-10-CM

## 2017-05-10 DIAGNOSIS — I10 Essential (primary) hypertension: Secondary | ICD-10-CM | POA: Diagnosis present

## 2017-05-10 DIAGNOSIS — R569 Unspecified convulsions: Secondary | ICD-10-CM | POA: Diagnosis not present

## 2017-05-10 DIAGNOSIS — I471 Supraventricular tachycardia: Secondary | ICD-10-CM | POA: Diagnosis present

## 2017-05-10 DIAGNOSIS — J449 Chronic obstructive pulmonary disease, unspecified: Secondary | ICD-10-CM | POA: Diagnosis present

## 2017-05-10 DIAGNOSIS — E870 Hyperosmolality and hypernatremia: Secondary | ICD-10-CM | POA: Diagnosis not present

## 2017-05-10 DIAGNOSIS — Z452 Encounter for adjustment and management of vascular access device: Secondary | ICD-10-CM

## 2017-05-10 DIAGNOSIS — Z6831 Body mass index (BMI) 31.0-31.9, adult: Secondary | ICD-10-CM

## 2017-05-10 DIAGNOSIS — E872 Acidosis: Secondary | ICD-10-CM | POA: Diagnosis present

## 2017-05-10 DIAGNOSIS — B9562 Methicillin resistant Staphylococcus aureus infection as the cause of diseases classified elsewhere: Secondary | ICD-10-CM | POA: Diagnosis present

## 2017-05-10 DIAGNOSIS — A0472 Enterocolitis due to Clostridium difficile, not specified as recurrent: Secondary | ICD-10-CM | POA: Diagnosis not present

## 2017-05-10 DIAGNOSIS — I82B12 Acute embolism and thrombosis of left subclavian vein: Secondary | ICD-10-CM | POA: Diagnosis not present

## 2017-05-10 DIAGNOSIS — I2510 Atherosclerotic heart disease of native coronary artery without angina pectoris: Secondary | ICD-10-CM | POA: Diagnosis present

## 2017-05-10 DIAGNOSIS — E669 Obesity, unspecified: Secondary | ICD-10-CM | POA: Diagnosis present

## 2017-05-10 DIAGNOSIS — Z825 Family history of asthma and other chronic lower respiratory diseases: Secondary | ICD-10-CM | POA: Diagnosis not present

## 2017-05-10 DIAGNOSIS — R451 Restlessness and agitation: Secondary | ICD-10-CM | POA: Diagnosis not present

## 2017-05-10 DIAGNOSIS — I2699 Other pulmonary embolism without acute cor pulmonale: Secondary | ICD-10-CM | POA: Diagnosis not present

## 2017-05-10 DIAGNOSIS — J810 Acute pulmonary edema: Secondary | ICD-10-CM | POA: Diagnosis not present

## 2017-05-10 DIAGNOSIS — I82A12 Acute embolism and thrombosis of left axillary vein: Secondary | ICD-10-CM | POA: Diagnosis not present

## 2017-05-10 DIAGNOSIS — J9690 Respiratory failure, unspecified, unspecified whether with hypoxia or hypercapnia: Secondary | ICD-10-CM

## 2017-05-10 DIAGNOSIS — Z0189 Encounter for other specified special examinations: Secondary | ICD-10-CM

## 2017-05-10 DIAGNOSIS — J441 Chronic obstructive pulmonary disease with (acute) exacerbation: Secondary | ICD-10-CM | POA: Diagnosis not present

## 2017-05-10 DIAGNOSIS — F419 Anxiety disorder, unspecified: Secondary | ICD-10-CM | POA: Diagnosis not present

## 2017-05-10 DIAGNOSIS — J13 Pneumonia due to Streptococcus pneumoniae: Secondary | ICD-10-CM

## 2017-05-10 DIAGNOSIS — Z9289 Personal history of other medical treatment: Secondary | ICD-10-CM

## 2017-05-10 DIAGNOSIS — G40209 Localization-related (focal) (partial) symptomatic epilepsy and epileptic syndromes with complex partial seizures, not intractable, without status epilepticus: Secondary | ICD-10-CM | POA: Diagnosis present

## 2017-05-10 DIAGNOSIS — I82432 Acute embolism and thrombosis of left popliteal vein: Secondary | ICD-10-CM | POA: Diagnosis not present

## 2017-05-10 DIAGNOSIS — J9611 Chronic respiratory failure with hypoxia: Secondary | ICD-10-CM

## 2017-05-10 DIAGNOSIS — E1165 Type 2 diabetes mellitus with hyperglycemia: Secondary | ICD-10-CM | POA: Diagnosis present

## 2017-05-10 DIAGNOSIS — F17210 Nicotine dependence, cigarettes, uncomplicated: Secondary | ICD-10-CM | POA: Diagnosis present

## 2017-05-10 DIAGNOSIS — J9621 Acute and chronic respiratory failure with hypoxia: Secondary | ICD-10-CM | POA: Diagnosis not present

## 2017-05-10 DIAGNOSIS — X58XXXA Exposure to other specified factors, initial encounter: Secondary | ICD-10-CM | POA: Diagnosis not present

## 2017-05-10 DIAGNOSIS — T17990A Other foreign object in respiratory tract, part unspecified in causing asphyxiation, initial encounter: Secondary | ICD-10-CM | POA: Diagnosis not present

## 2017-05-10 DIAGNOSIS — J189 Pneumonia, unspecified organism: Secondary | ICD-10-CM | POA: Diagnosis present

## 2017-05-10 DIAGNOSIS — E1151 Type 2 diabetes mellitus with diabetic peripheral angiopathy without gangrene: Secondary | ICD-10-CM | POA: Diagnosis present

## 2017-05-10 DIAGNOSIS — E876 Hypokalemia: Secondary | ICD-10-CM | POA: Diagnosis not present

## 2017-05-10 DIAGNOSIS — E785 Hyperlipidemia, unspecified: Secondary | ICD-10-CM | POA: Diagnosis present

## 2017-05-10 DIAGNOSIS — M79609 Pain in unspecified limb: Secondary | ICD-10-CM | POA: Diagnosis not present

## 2017-05-10 DIAGNOSIS — R0602 Shortness of breath: Secondary | ICD-10-CM | POA: Diagnosis not present

## 2017-05-10 DIAGNOSIS — I82622 Acute embolism and thrombosis of deep veins of left upper extremity: Secondary | ICD-10-CM | POA: Diagnosis not present

## 2017-05-10 DIAGNOSIS — J690 Pneumonitis due to inhalation of food and vomit: Secondary | ICD-10-CM | POA: Diagnosis present

## 2017-05-10 DIAGNOSIS — J9601 Acute respiratory failure with hypoxia: Secondary | ICD-10-CM

## 2017-05-10 DIAGNOSIS — I739 Peripheral vascular disease, unspecified: Secondary | ICD-10-CM | POA: Diagnosis not present

## 2017-05-10 DIAGNOSIS — D7589 Other specified diseases of blood and blood-forming organs: Secondary | ICD-10-CM | POA: Diagnosis present

## 2017-05-10 DIAGNOSIS — G40909 Epilepsy, unspecified, not intractable, without status epilepticus: Secondary | ICD-10-CM | POA: Diagnosis present

## 2017-05-10 DIAGNOSIS — D751 Secondary polycythemia: Secondary | ICD-10-CM | POA: Diagnosis present

## 2017-05-10 DIAGNOSIS — G9340 Encephalopathy, unspecified: Secondary | ICD-10-CM | POA: Diagnosis present

## 2017-05-10 DIAGNOSIS — R6521 Severe sepsis with septic shock: Secondary | ICD-10-CM | POA: Diagnosis not present

## 2017-05-10 DIAGNOSIS — I959 Hypotension, unspecified: Secondary | ICD-10-CM | POA: Diagnosis not present

## 2017-05-10 DIAGNOSIS — F1120 Opioid dependence, uncomplicated: Secondary | ICD-10-CM | POA: Diagnosis present

## 2017-05-10 DIAGNOSIS — Z01818 Encounter for other preprocedural examination: Secondary | ICD-10-CM

## 2017-05-10 DIAGNOSIS — Z781 Physical restraint status: Secondary | ICD-10-CM

## 2017-05-10 DIAGNOSIS — G8929 Other chronic pain: Secondary | ICD-10-CM | POA: Diagnosis present

## 2017-05-10 DIAGNOSIS — Z9889 Other specified postprocedural states: Secondary | ICD-10-CM | POA: Diagnosis present

## 2017-05-10 DIAGNOSIS — R0603 Acute respiratory distress: Secondary | ICD-10-CM

## 2017-05-10 DIAGNOSIS — Z9911 Dependence on respirator [ventilator] status: Secondary | ICD-10-CM

## 2017-05-10 DIAGNOSIS — Z4659 Encounter for fitting and adjustment of other gastrointestinal appliance and device: Secondary | ICD-10-CM

## 2017-05-10 LAB — RAPID URINE DRUG SCREEN, HOSP PERFORMED
AMPHETAMINES: NOT DETECTED
BENZODIAZEPINES: POSITIVE — AB
Barbiturates: NOT DETECTED
COCAINE: NOT DETECTED
OPIATES: NOT DETECTED
Tetrahydrocannabinol: NOT DETECTED

## 2017-05-10 LAB — CBC WITH DIFFERENTIAL/PLATELET
BASOS ABS: 0.1 10*3/uL (ref 0.0–0.1)
BASOS PCT: 1 %
EOS ABS: 0 10*3/uL (ref 0.0–0.7)
Eosinophils Relative: 0 %
HCT: 54.9 % — ABNORMAL HIGH (ref 39.0–52.0)
Hemoglobin: 18.2 g/dL — ABNORMAL HIGH (ref 13.0–17.0)
LYMPHS PCT: 9 %
Lymphs Abs: 0.9 10*3/uL (ref 0.7–4.0)
MCH: 33.5 pg (ref 26.0–34.0)
MCHC: 33.2 g/dL (ref 30.0–36.0)
MCV: 101.1 fL — ABNORMAL HIGH (ref 78.0–100.0)
MONO ABS: 1 10*3/uL (ref 0.1–1.0)
Monocytes Relative: 10 %
NEUTROS ABS: 7.8 10*3/uL — AB (ref 1.7–7.7)
Neutrophils Relative %: 80 %
PLATELETS: 165 10*3/uL (ref 150–400)
RBC: 5.43 MIL/uL (ref 4.22–5.81)
RDW: 13.2 % (ref 11.5–15.5)
WBC: 9.8 10*3/uL (ref 4.0–10.5)

## 2017-05-10 LAB — BASIC METABOLIC PANEL
ANION GAP: 9 (ref 5–15)
BUN: 17 mg/dL (ref 6–20)
CALCIUM: 9.6 mg/dL (ref 8.9–10.3)
CO2: 34 mmol/L — AB (ref 22–32)
CREATININE: 0.65 mg/dL (ref 0.61–1.24)
Chloride: 96 mmol/L — ABNORMAL LOW (ref 101–111)
GLUCOSE: 151 mg/dL — AB (ref 65–99)
Potassium: 4.2 mmol/L (ref 3.5–5.1)
Sodium: 139 mmol/L (ref 135–145)

## 2017-05-10 LAB — I-STAT ARTERIAL BLOOD GAS, ED
ACID-BASE EXCESS: 10 mmol/L — AB (ref 0.0–2.0)
Acid-Base Excess: 10 mmol/L — ABNORMAL HIGH (ref 0.0–2.0)
BICARBONATE: 39.7 mmol/L — AB (ref 20.0–28.0)
Bicarbonate: 39.8 mmol/L — ABNORMAL HIGH (ref 20.0–28.0)
O2 SAT: 97 %
O2 Saturation: 93 %
PCO2 ART: 71.6 mmHg — AB (ref 32.0–48.0)
PH ART: 7.349 — AB (ref 7.350–7.450)
PO2 ART: 102 mmHg (ref 83.0–108.0)
Patient temperature: 97.8
TCO2: 42 mmol/L (ref 0–100)
TCO2: 42 mmol/L (ref 0–100)
pCO2 arterial: 72.5 mmHg (ref 32.0–48.0)
pH, Arterial: 7.348 — ABNORMAL LOW (ref 7.350–7.450)
pO2, Arterial: 74 mmHg — ABNORMAL LOW (ref 83.0–108.0)

## 2017-05-10 LAB — GLUCOSE, CAPILLARY: Glucose-Capillary: 196 mg/dL — ABNORMAL HIGH (ref 65–99)

## 2017-05-10 LAB — I-STAT TROPONIN, ED: TROPONIN I, POC: 0.01 ng/mL (ref 0.00–0.08)

## 2017-05-10 LAB — I-STAT CG4 LACTIC ACID, ED: Lactic Acid, Venous: 1.7 mmol/L (ref 0.5–1.9)

## 2017-05-10 MED ORDER — METHYLPREDNISOLONE SODIUM SUCC 125 MG IJ SOLR: 125.0000 mg | Freq: Once | INTRAMUSCULAR | Status: DC

## 2017-05-10 MED ORDER — IPRATROPIUM-ALBUTEROL 0.5-2.5 (3) MG/3ML IN SOLN
3.0000 mL | Freq: Once | RESPIRATORY_TRACT | Status: AC
  Administered 2017-05-10: 3 mL via RESPIRATORY_TRACT
  Filled 2017-05-10: qty 3

## 2017-05-10 MED ORDER — ALBUTEROL (5 MG/ML) CONTINUOUS INHALATION SOLN
10.0000 mg/h | INHALATION_SOLUTION | Freq: Once | RESPIRATORY_TRACT | Status: AC
  Administered 2017-05-10: 10 mg/h via RESPIRATORY_TRACT
  Filled 2017-05-10: qty 20

## 2017-05-10 MED ORDER — ALBUTEROL SULFATE (2.5 MG/3ML) 0.083% IN NEBU
2.5000 mg | INHALATION_SOLUTION | Freq: Once | RESPIRATORY_TRACT | Status: AC
  Administered 2017-05-10: 2.5 mg via RESPIRATORY_TRACT
  Filled 2017-05-10: qty 3

## 2017-05-10 MED ORDER — LORAZEPAM 2 MG/ML IJ SOLN
1.0000 mg | Freq: Four times a day (QID) | INTRAMUSCULAR | Status: DC | PRN
  Administered 2017-05-10 – 2017-05-11 (×2): 1 mg via INTRAVENOUS
  Filled 2017-05-10 (×3): qty 1

## 2017-05-10 MED ORDER — LORAZEPAM BOLUS VIA INFUSION
1.0000 mg | Freq: Once | INTRAVENOUS | Status: DC
  Filled 2017-05-10: qty 1

## 2017-05-10 NOTE — ED Triage Notes (Signed)
To room via EMS. On CPAP.  Onset today shortness of breath.   Recent dx pneumonia.  Pt alert, labored breathing.

## 2017-05-10 NOTE — Progress Notes (Signed)
Critical ABG results hand delivered to K. Ruthann Cancer, PA. No changes at this time and pt to remain on bipap. RT will continue to monitor.

## 2017-05-10 NOTE — Progress Notes (Signed)
Critical ABG results hand delivered to Dr Jeanell Sparrow in ED. No changes at this time. Pt to remain on bipap. RT will continue to monitor.

## 2017-05-10 NOTE — Progress Notes (Signed)
Pt brought in by EMS on cpap. Pt with Shallow/labored breathing on cpap. Pt taken off and placed on our NIV/PC bipap. Pt stating he can't feel bipap working and is very anxious. RT reassured pt it was working and to just give it a few minutes to work. Pt in agreement. Good VT, Low leak, VS within normal limits. RT will continue to closely monitor pt

## 2017-05-10 NOTE — ED Provider Notes (Signed)
[facility] DEPT Provider Note   CSN: [id number] Arrival date & time: 05/10/17  1525     History   Chief Complaint No chief complaint on file.   HPI Harold Bailey is a 52 y.o. male who presents with SOB. PMH significant for COPD, HTN, HLD, hx of polysubstance abuse, brain tumor, seizures. He has been living at [facility] in [place] for a rehab program. He was admitted at [facility] in [place], [place] for chest pain, SOB, and palpitations. He underwent a cardiac cath on 05/08/2017 which showed non-obstructive CAD and medical management was recommended. He was also treated for a COPD exacerbation with steroids and Cefdinir which he has been taking. He states he has not resumed smoking. Today he started becoming more SOB again. He has been using his inhalers without relief. EMS was called and he received three breathing tx, 125 of Solumedrol, mg sulfate 2mg .  HPI  Past Medical History:  Diagnosis Date  . Anxiety   . Arthritis    Left ankle  . COPD (chronic obstructive pulmonary disease) ([place])   . Drug abuse   . Gait disturbance   . GERD (gastroesophageal reflux disease)   . 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  Marland Kitchen Hyperlipidemia   . Hypertension   . 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 Memory loss   . Peripheral vision loss    Left due to brain surgery  . Pneumonia   . Polycythemia   . Prediabetes   . Seizures ([place]) 11/19/2013   none since 2016  . Shortness of breath    Hx of smoking.    Patient Active Problem List   Diagnosis Date Noted  . Plantar fasciitis of left foot 01/14/2017  . Traumatic arthritis of ankle, right 01/07/2017  . H/O ankle fusion 11/14/2016  . 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 At risk for adverse drug event 10/08/2016  . History of snoring 10/08/2016  . Subtalar joint instability, left   . 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 COPD with  exacerbation ([place]) 07/02/2016  . History of alcohol abuse 07/02/2016  . COPD exacerbation ([place]) 07/02/2016  . Tobacco dependence 06/06/2015  . Medication overuse headache 02/28/2015  . Localization-related symptomatic epilepsy and epileptic syndromes with complex partial seizures, not intractable, without status epilepticus ([place]) 02/28/2015  . Cognitive impairment 02/28/2015  . 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 Tobacco abuse 02/28/2015  . Brain tumor ([place]) 11/19/2013  . Seizures ([place]) 11/19/2013  . Memory loss   . HA (headache)   . Gait disturbance   . Pain in left ankle and joints of left foot 10/15/2013  . Falls frequently 10/15/2013  . Swelling 09/09/2013  . Rash and nonspecific skin eruption 09/09/2013  . Ankle fracture, lateral malleolus, closed 09/03/2013  . Unspecified vitamin D deficiency 08/12/2013  . Polycythemia   . Memory deficit   . History of drug abuse   . GERD (gastroesophageal reflux disease)   . Anxiety   . Arthritis   . Hyperlipidemia   . Prediabetes     Past Surgical History:  Procedure Laterality Date  . ANKLE ARTHROSCOPY  01/17/2012   Procedure: ANKLE ARTHROSCOPY;  Surgeon: [name], MD;  Location: [facility];  Service: Orthopedics;  Laterality: Left;  . ANKLE ARTHROSCOPY WITH FUSION Left 10/02/2016   Procedure: Left Posterior Subtalar Arthrodesis Arthroscopy;  Surgeon: [name], MD;  Location: [facility];  Service: Orthopedics;  Laterality: Left;  . ANKLE FUSION Left 11/2012   Dr Sharol Given  . ANKLE SURGERY  2003   Left  . BRAIN SURGERY  2009   Biopsy  . FRACTURE SURGERY  2003   Left ankle  . HARDWARE REMOVAL Left 12/08/2012   Procedure: HARDWARE REMOVAL;  Surgeon: [name], MD;  Location: [facility];  Service: Orthopedics;  Laterality: Left;  Removal Deep Hardware, Take Down Non-Union, Revision Internal Fixation Left Ankle  . I&D EXTREMITY Left 01/01/2013   Procedure: IRRIGATION AND DEBRIDEMENT EXTREMITY;  Surgeon: [name], MD;  Location: [place] [facility];   Service: Orthopedics;  Laterality: Left;  Irrigation, Debridement and Placement Antibiotic Beads Left Ankle  . INNER EAR SURGERY    . OPEN REDUCTION INTERNAL FIXATION (ORIF) SCAPHOID WITH DISTAL RADIUS GRAFT Right 02/01/2013   Procedure: OPEN REDUCTION INTERNAL FIXATION (ORIF) RIGHT SCAPHOID FRACTURE WITH DISTAL RADIUS GRAFT;  Surgeon: Schuyler Amor, MD;  Location: [facility];  Service: Orthopedics;  Laterality: Right;  . ORIF ANKLE FRACTURE Left 12/08/2012   Procedure: OPEN REDUCTION INTERNAL FIXATION (ORIF) ANKLE FRACTURE;  Surgeon: [name], MD;  Location: [facility];  Service: Orthopedics;  Laterality: Left;  Removal Deep Hardware, Take Down Non-Union, Revision Internal Fixation Left Ankle  . ORIF ANKLE FRACTURE Right 09/03/2013   Procedure: OPEN REDUCTION INTERNAL FIXATION (ORIF) ANKLE FRACTURE;  Surgeon: [name], MD;  Location: [facility];  Service: Orthopedics;  Laterality: Right;  Open Reduction Internal Fixation Right Fibula  . SCAPHOID FRACTURE SURGERY  02/04/2013  . SHOULDER ARTHROSCOPY  2008   Left  . TYMPANOSTOMY TUBE PLACEMENT  1993       Home Medications    Prior to Admission medications   Medication Sig Start Date End Date Taking? Authorizing Provider  acetaminophen (TYLENOL) 500 MG tablet Take by mouth every 6 (six) hours as needed.     [provider]  albuterol (PROVENTIL HFA;VENTOLIN HFA) 108 (90 Base) MCG/ACT inhaler Inhale 2 puffs into the lungs every 4 (four) hours as needed for wheezing or shortness of breath (cough, shortness of breath or wheezing.). 11/07/16   Lauree Chandler, NP  ALPRAZolam Duanne Moron) 1 MG tablet Take 1 mg by mouth at bedtime as needed for anxiety.    [provider]  buPROPion (ZYBAN) 150 MG 12 hr tablet TAKE 1 TABLET(150 MG) BY MOUTH TWICE DAILY 02/05/17   Suzan Slick, NP  cyclobenzaprine (FLEXERIL) 10 MG tablet Take 10 mg by mouth 3 (three) times daily as needed for muscle spasms.    [provider]  diclofenac sodium  (VOLTAREN) 1 % GEL APPLY 2 TO 4 GRAMS EXTERNALLY TO THE AFFECTED AREA TWICE DAILY 01/07/17   [name], MD  diclofenac sodium (VOLTAREN) 1 % GEL APPLY 2 TO 4 GRAMS EXTERNALLY TO THE AFFECTED AREA TWICE DAILY 03/17/17   [name], MD  divalproex (DEPAKOTE ER) 500 MG 24 hr tablet Take 2 tablets (1,000 mg total) by mouth daily. 11/22/16   Pieter Partridge, DO  docusate sodium (COLACE) 100 MG capsule Take 100 mg by mouth 2 (two) times daily.    [provider]  Fluticasone-Salmeterol (ADVAIR DISKUS) 250-50 MCG/DOSE AEPB Inhale 1 puff into the lungs 2 (two) times daily. 11/07/16   Lauree Chandler, NP  gabapentin (NEURONTIN) 300 MG capsule gabapentin 300 mg capsule  TK ONE C PO TID UTD    [provider]  gabapentin (NEURONTIN) 300 MG capsule TAKE 1 CAPSULE(300 MG) BY  MOUTH THREE TIMES DAILY 04/11/17   Suzan Slick, NP  HYDROcodone-acetaminophen (NORCO/VICODIN) 5-325 MG tablet hydrocodone 5 mg-acetaminophen 325 mg tablet  TK 1 T PO Q 6 H PRF 3 DAYS    [provider]  HYDROcodone-acetaminophen (NORCO/VICODIN) 5-325 MG tablet Take 1 tablet by mouth every 6 (six) hours as needed for moderate pain. 03/18/17   [name], MD  Lacosamide (VIMPAT) 100 MG TABS Take 1 tablet (100 mg total) by mouth 2 (two) times daily. 11/22/16   Pieter Partridge, DO  lisinopril-hydrochlorothiazide (PRINZIDE,ZESTORETIC) 20-25 MG tablet lisinopril 20 mg-hydrochlorothiazide 25 mg tablet    [provider]  meloxicam (MOBIC) 15 MG tablet Take 15 mg by mouth daily.    [provider]  methocarbamol (ROBAXIN) 500 MG tablet methocarbamol 500 mg tablet  TK 1 T PO Q 6 TO 8 H PRF SPASMS OR PAIN    [provider]  Multiple Vitamin (MULTIVITAMIN WITH MINERALS) TABS tablet Take 1 tablet by mouth daily. 07/04/16   Johnson, Clanford L, MD  nabumetone (RELAFEN) 500 MG tablet Take 1 tablet (500 mg total) by mouth 2 (two) times daily as needed. 03/28/17   [name], MD  naproxen  (NAPROSYN) 500 MG tablet TAKE 1 TABLET(500 MG) BY MOUTH TWICE DAILY WITH MEALS AS NEEDED FOR MILD PAIN OR MODERATE PAIN 11/15/16   Suzan Slick, NP  traZODone (DESYREL) 50 MG tablet Take 1/2 tablet by mouth twice daily as needed for anxiety. Stop Date 11/23/16 11/07/16 11/23/16  Lauree Chandler, NP  varenicline (CHANTIX) 0.5 MG tablet Take 1 tablet (0.5 mg total) by mouth 2 (two) times daily. 02/04/17   [name], MD    Family History Family History  Problem Relation Age of Onset  . Hypertension Mother   . Bronchitis Mother   . Cancer Maternal Aunt   . Cancer Maternal Grandmother   . Stroke Neg Hx   . Diabetes Neg Hx     Social History Social History  Substance Use Topics  . Smoking status: Current Every Day Smoker    Packs/day: 0.25    Years: 30.00    Types: Cigarettes  . Smokeless tobacco: Never Used  . Alcohol use No     Comment: quit Oct. 2017(heavy drinker) .In AA     Allergies   Hydrocodone and No known allergies   Review of Systems Review of Systems  Constitutional: Positive for fatigue. Negative for chills and fever.  Respiratory: Positive for cough, chest tightness, shortness of breath and wheezing.   Cardiovascular: Positive for chest pain. Negative for palpitations and leg swelling.  Gastrointestinal: Positive for nausea. Negative for abdominal pain and vomiting.  Neurological: Positive for light-headedness. Negative for syncope and weakness.  All other systems reviewed and are negative.    Physical Exam Updated Vital Signs BP (!) 148/105   Pulse (!) 101   Temp 97.8 F (36.6 C) (Oral)   Resp 18   SpO2 96%   Physical Exam  Constitutional: He is oriented to person, place, and time. He appears well-developed and well-nourished. He appears distressed.  Chronically ill appearing male on Bipap  HENT:  Head: Normocephalic and atraumatic.  Eyes: Right eye exhibits no discharge. Left eye exhibits no discharge. Right conjunctiva is injected. Left  conjunctiva is injected. No scleral icterus.  Neck: Normal range of motion.  Cardiovascular: Tachycardia present.  Exam reveals no gallop and no friction rub.   No murmur heard. Pulmonary/Chest: Tachypnea noted. He is in respiratory  distress. He has decreased breath sounds. He has wheezes. He has no rhonchi. He has no rales.  Abdominal: Soft. Bowel sounds are normal. He exhibits no distension and no mass. There is no tenderness. There is no rebound and no guarding.  Musculoskeletal:  No leg edema  Neurological: He is alert and oriented to person, place, and time.  Skin: Skin is warm and dry. He is not diaphoretic.  Psychiatric: He has a normal mood and affect. His behavior is normal.  Nursing note and vitals reviewed.    ED Treatments / Results  Labs (all labs ordered are listed, but only abnormal results are displayed) Labs Reviewed  BASIC METABOLIC PANEL - Abnormal; Notable for the following:       Result Value   Chloride 96 (*)    CO2 34 (*)    Glucose, Bld 151 (*)    All other components within normal limits  CBC WITH DIFFERENTIAL/PLATELET - Abnormal; Notable for the following:    Hemoglobin 18.2 (*)    HCT 54.9 (*)    MCV 101.1 (*)    Neutro Abs 7.8 (*)    All other components within normal limits  RAPID URINE DRUG SCREEN, HOSP PERFORMED - Abnormal; Notable for the following:    Benzodiazepines POSITIVE (*)    All other components within normal limits  GLUCOSE, CAPILLARY - Abnormal; Notable for the following:    Glucose-Capillary 196 (*)    All other components within normal limits  I-STAT ARTERIAL BLOOD GAS, ED - Abnormal; Notable for the following:    pH, Arterial 7.348 (*)    pCO2 arterial 72.5 (*)    Bicarbonate 39.8 (*)    Acid-Base Excess 10.0 (*)    All other components within normal limits  I-STAT ARTERIAL BLOOD GAS, ED - Abnormal; Notable for the following:    pH, Arterial 7.349 (*)    pCO2 arterial 71.6 (*)    pO2, Arterial 74.0 (*)    Bicarbonate 39.7  (*)    Acid-Base Excess 10.0 (*)    All other components within normal limits  MRSA PCR SCREENING  HIV ANTIBODY (ROUTINE TESTING)  BASIC METABOLIC PANEL  CBC  I-STAT TROPONIN, ED  I-STAT CG4 LACTIC ACID, ED    EKG  EKG Interpretation  Date/Time:  Saturday May 10 2017 15:34:50 EDT Ventricular Rate:  101 PR Interval:    QRS Duration: 98 QT Interval:  337 QTC Calculation: 437 R Axis:   80 Text Interpretation:  Sinus tachycardia Non-specific ST-t changes compared to ekg of 02 October 2016 Confirmed by Pattricia Boss [phone]) on 05/10/2017 4:54:40 PM       Radiology Dg Chest Port 1 View  Result Date: 05/10/2017 CLINICAL DATA:  Shortness of Breath EXAM: PORTABLE CHEST 1 VIEW COMPARISON:  July 02, 2016 FINDINGS: There is bullous disease in the upper lobes bilaterally. Interstitial prominence elsewhere in the lungs in part is due to redistribution of blood flow to viable lung segments. There are scattered areas of scarring in both mid and lower lung zones. There is no frank edema or consolidation. The heart size and pulmonary vascularity are normal. There is stable decreased vascularity in the upper lobes consistent with the underlying bullous disease. No adenopathy. No bone lesions. IMPRESSION: Upper lobe bullous disease. Areas of mild scarring. No edema or consolidation. Electronically Signed   By: Lowella Grip III M.D.   On: 05/10/2017 17:01    Procedures Procedures (including critical care time)  CRITICAL CARE Performed by: Recardo Evangelist  Total critical care time: 35 minutes  Critical care time was exclusive of separately billable procedures and treating other patients.  Critical care was necessary to treat or prevent imminent or life-threatening deterioration.  Critical care was time spent personally by me on the following activities: development of treatment plan with patient and/or surrogate as well as nursing, discussions with consultants, evaluation of  patient's response to treatment, examination of patient, obtaining history from patient or surrogate, ordering and performing treatments and interventions, ordering and review of laboratory studies, ordering and review of radiographic studies, pulse oximetry and re-evaluation of patient's condition.   Medications Ordered in ED Medications  LORazepam (ATIVAN) injection 1 mg (1 mg Intravenous Given 05/10/17 2247)  gabapentin (NEURONTIN) capsule 300 mg (not administered)  buPROPion (WELLBUTRIN SR) 12 hr tablet 150 mg (not administered)  diclofenac sodium (VOLTAREN) 1 % transdermal gel 2 g (not administered)  divalproex (DEPAKOTE ER) 24 hr tablet 1,000 mg (not administered)  lacosamide (VIMPAT) tablet 100 mg (not administered)  meloxicam (MOBIC) tablet 15 mg (not administered)  fluticasone furoate-vilanterol (BREO ELLIPTA) 200-25 MCG/INH 1 puff (not administered)  enoxaparin (LOVENOX) injection 40 mg (not administered)  methylPREDNISolone sodium succinate (SOLU-MEDROL) 40 mg/mL injection 40 mg (not administered)  albuterol (PROVENTIL) (2.5 MG/3ML) 0.083% nebulizer solution 2.5 mg (not administered)  albuterol (PROVENTIL) (2.5 MG/3ML) 0.083% nebulizer solution 2.5 mg (not administered)  ondansetron (ZOFRAN) tablet 4 mg (not administered)    Or  ondansetron (ZOFRAN) injection 4 mg (not administered)  acetaminophen (TYLENOL) tablet 650 mg (not administered)    Or  acetaminophen (TYLENOL) suppository 650 mg (not administered)  cefTRIAXone (ROCEPHIN) 1 g in dextrose 5 % 50 mL IVPB (not administered)  azithromycin (ZITHROMAX) tablet 500 mg (not administered)  lisinopril (PRINIVIL,ZESTRIL) tablet 20 mg (not administered)  hydrochlorothiazide (HYDRODIURIL) tablet 25 mg (not administered)  ipratropium-albuterol (DUONEB) 0.5-2.5 (3) MG/3ML nebulizer solution 3 mL (3 mLs Nebulization Given 05/10/17 1621)  albuterol (PROVENTIL,VENTOLIN) solution continuous neb (10 mg/hr Nebulization Given 05/10/17 1703)    albuterol (PROVENTIL) (2.5 MG/3ML) 0.083% nebulizer solution 2.5 mg (2.5 mg Nebulization Given 05/10/17 2059)     Initial Impression / Assessment and Plan / ED Course  I have reviewed the triage vital signs and the nursing notes.  Pertinent labs & imaging results that were available during my care of the patient were reviewed by me and considered in my medical decision making (see chart for details).  51 year old male with COPD exacerbation. He is in respiratory distress and on Bipap on my initial evaluation. He is hypertensive and mildly tachycardic. He was given 3 breathing tx by EMS, steroids, and Mag. Here he was given another Duoneb and then a continuous neb with minimal improvement. Will obtain labs, EKG, CXR.   CBC shows elevated Hgb. BMP shows hyperglycemia. ABG shows pH of 7.34 and CO2 of 71.6. EKG is sinus tachycardia and Trop is normal. CXR is negative. On reassessment he is awake and alert, answering question appropriately. Lungs still have significantly decreased breath sounds on repeat exam. Spoke with Dr. Tamala Julian with critical care who evaluated patient and felt he was okay for SDU. Spoke with Dr. Loleta Books who will admit.    Final Clinical Impressions(s) / ED Diagnoses   Final diagnoses:  COPD exacerbation [facility])    New Prescriptions New Prescriptions   No medications on file     Iris Pert 05/11/17 [id number]    Pattricia Boss, MD 05/11/17 [phone]

## 2017-05-10 NOTE — H&P (Signed)
History and Physical  Patient Name: Harold Bailey     KVQ:[id number]    DOB: 1966-06-18    DOA: 05/10/2017 PCP: Everardo Beals, NP  Patient coming from: Home  Chief Complaint: Dyspnea      HPI: Harold Bailey is a 51 y.o. male with a past medical history significant for anxiety, asthma/COPD, smoking, SVT, seizures, and Bipolar and substance abuse who presents with dyspnea.  The patient was up in [place] at a drug rehab place (has been clean some months now) until this week.  On Monday, he was seen for chest pain, found to have new SVT, cardioverted with adenosine and discharged with Cards f/u and new metoprolol. Tuesday he was admitted for continued episodes of chest pain.  During that hospitalization, he had a nuc med stress with defect, but follow up left heart catheterization showed only nonobstructive disease.  He had no more episodes of SVT or chest pain.  While there, he did however, have an episode where he desaturated to the 60s, and started to cough up mucus (chest x-ray and chest CT were both negative for pneumonia), but he was started on Solu-medrol and antibiotics.  He was discharged two days ago with prednisone and cefdinir, which he has been taking.  Since discharge, he came back to [facility] and is staying with his wife/ex-wife.  In the last 24 hours, he noticed that his breathing was starting to get worse, and by tonight, he was having extreme difficulty with breathing, so he called EMS who gave CPAP, solu-medrol, Mag, and nebs en route.  ED course: -Afebrile, heart rate 101, respirations 30, BP 248/105, pulse ox 93% on BiPAP -Na 139, K 4.2, Cr 0.65, WBC 9.8K, Hgb 18.2 and macrocytic -CXR showed no consolidation or edema but did show bullous emphysema -Troponin negatgive, ECG showed no ST changes -He was given more nebs and started on BiPAP and appear dyspneic -Lactic acid normal, case was discussed with CCM who recommended BiPAP and stepdown care -ABG showed pH  7.34, pCO2 72, bicarb 39     ROS: Review of Systems  Respiratory: Positive for shortness of breath and wheezing.   Cardiovascular: Positive for chest pain and palpitations.  All other systems reviewed and are negative.         Past Medical History:  Diagnosis Date  . Anxiety   . Arthritis    Left ankle  . COPD (chronic obstructive pulmonary disease) ([place])   . Drug abuse   . Gait disturbance   . GERD (gastroesophageal reflux disease)   . 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  Marland Kitchen Hyperlipidemia   . Hypertension   . 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 Memory loss   . Peripheral vision loss    Left due to brain surgery  . Pneumonia   . Polycythemia   . Prediabetes   . Seizures ([place]) 11/19/2013   none since 2016  . Shortness of breath    Hx of smoking.    Past Surgical History:  Procedure Laterality Date  . ANKLE ARTHROSCOPY  01/17/2012   Procedure: ANKLE ARTHROSCOPY;  Surgeon: [name], MD;  Location: [place];  Service: Orthopedics;  Laterality: Left;  . ANKLE ARTHROSCOPY WITH FUSION Left 10/02/2016   Procedure: Left Posterior Subtalar Arthrodesis Arthroscopy;  Surgeon: [name], MD;  Location: [place];  Service: Orthopedics;  Laterality: Left;  . ANKLE FUSION Left 11/2012  Dr Sharol Given  . ANKLE SURGERY  2003   Left  . BRAIN SURGERY  2009   Biopsy  . FRACTURE SURGERY  2003   Left ankle  . HARDWARE REMOVAL Left 12/08/2012   Procedure: HARDWARE REMOVAL;  Surgeon: [name], MD;  Location: [place];  Service: Orthopedics;  Laterality: Left;  Removal Deep Hardware, Take Down Non-Union, Revision Internal Fixation Left Ankle  . I&D EXTREMITY Left 01/01/2013   Procedure: IRRIGATION AND DEBRIDEMENT EXTREMITY;  Surgeon: [name], MD;  Location: [place];  Service: Orthopedics;  Laterality: Left;  Irrigation, Debridement and Placement Antibiotic Beads Left Ankle  . INNER EAR SURGERY    . OPEN  REDUCTION INTERNAL FIXATION (ORIF) SCAPHOID WITH DISTAL RADIUS GRAFT Right 02/01/2013   Procedure: OPEN REDUCTION INTERNAL FIXATION (ORIF) RIGHT SCAPHOID FRACTURE WITH DISTAL RADIUS GRAFT;  Surgeon: Schuyler Amor, MD;  Location: [place];  Service: Orthopedics;  Laterality: Right;  . ORIF ANKLE FRACTURE Left 12/08/2012   Procedure: OPEN REDUCTION INTERNAL FIXATION (ORIF) ANKLE FRACTURE;  Surgeon: [name], MD;  Location: [place];  Service: Orthopedics;  Laterality: Left;  Removal Deep Hardware, Take Down Non-Union, Revision Internal Fixation Left Ankle  . ORIF ANKLE FRACTURE Right 09/03/2013   Procedure: OPEN REDUCTION INTERNAL FIXATION (ORIF) ANKLE FRACTURE;  Surgeon: [name], MD;  Location: [place];  Service: Orthopedics;  Laterality: Right;  Open Reduction Internal Fixation Right Fibula  . SCAPHOID FRACTURE SURGERY  02/04/2013  . SHOULDER ARTHROSCOPY  2008   Left  . TYMPANOSTOMY TUBE PLACEMENT  1993    Social History: Patient currently is staying with his wife.  The patient walks unassisted.  Smoker.  Allergies  Allergen Reactions  . Hydrocodone Rash  . No Known Allergies     Family history: family history includes Bronchitis in his mother; Cancer in his maternal aunt and maternal grandmother; Hypertension in his mother.  Prior to Admission medications   Medication Sig Start Date End Date Taking? Authorizing Provider  acetaminophen (TYLENOL) 500 MG tablet Take 1,500 mg by mouth every 6 (six) hours as needed for mild pain.    Yes [provider]  albuterol (PROVENTIL HFA;VENTOLIN HFA) 108 (90 Base) MCG/ACT inhaler Inhale 2 puffs into the lungs every 4 (four) hours as needed for wheezing or shortness of breath (cough, shortness of breath or wheezing.). 11/07/16  Yes Lauree Chandler, NP  ALPRAZolam Duanne Moron) 1 MG tablet Take 1 mg by mouth at bedtime as needed for anxiety.   Yes [provider]  buPROPion (ZYBAN) 150 MG 12 hr tablet TAKE 1 TABLET(150 MG) BY MOUTH  TWICE DAILY 02/05/17  Yes Dondra Prader R, NP  cyclobenzaprine (FLEXERIL) 10 MG tablet Take 10 mg by mouth 3 (three) times daily as needed for muscle spasms.   Yes [provider]  diclofenac sodium (VOLTAREN) 1 % GEL APPLY 2 TO 4 GRAMS EXTERNALLY TO THE AFFECTED AREA TWICE DAILY 01/07/17  Yes [name], MD  divalproex (DEPAKOTE ER) 500 MG 24 hr tablet Take 2 tablets (1,000 mg total) by mouth daily. 11/22/16  Yes Tomi Likens, Adam R, DO  docusate sodium (COLACE) 100 MG capsule Take 100 mg by mouth 2 (two) times daily as needed for mild constipation.    Yes [provider]  Fluticasone-Salmeterol (ADVAIR DISKUS) 250-50 MCG/DOSE AEPB Inhale 1 puff into the lungs 2 (two) times daily. 11/07/16  Yes Lauree Chandler, NP  gabapentin (NEURONTIN) 300 MG capsule TAKE 1 CAPSULE(300 MG) BY MOUTH THREE TIMES  DAILY Patient taking differently: TAKE 1 CAPSULE(300 MG) BY MOUTH TWICE DAILY 04/11/17  Yes Dondra Prader R, NP  Lacosamide (VIMPAT) 100 MG TABS Take 1 tablet (100 mg total) by mouth 2 (two) times daily. 11/22/16  Yes Jaffe, Adam R, DO  lisinopril-hydrochlorothiazide (PRINZIDE,ZESTORETIC) 20-25 MG tablet TAKE 1 TABLET BY MOUTH DAILY   Yes [provider]  meloxicam (MOBIC) 15 MG tablet Take 15 mg by mouth daily.   Yes [provider]  methocarbamol (ROBAXIN) 500 MG tablet TAKE 500MG  BY MOUTH EVERY 6 TO 8 HOURS AS NEEDED FOR MUSCLE SPASMS   Yes [provider]  Multiple Vitamin (MULTIVITAMIN WITH MINERALS) TABS tablet Take 1 tablet by mouth daily. 07/04/16  Yes Johnson, Clanford L, MD  nabumetone (RELAFEN) 500 MG tablet Take 1 tablet (500 mg total) by mouth 2 (two) times daily as needed. 03/28/17  Yes [name], MD  naproxen (NAPROSYN) 500 MG tablet TAKE 1 TABLET(500 MG) BY MOUTH TWICE DAILY WITH MEALS AS NEEDED FOR MILD PAIN OR MODERATE PAIN 11/15/16  Yes Dondra Prader R, NP  traZODone (DESYREL) 50 MG tablet Take 1/2 tablet by mouth twice daily as needed for anxiety. Stop  Date 11/23/16 11/07/16 05/10/17 Yes Lauree Chandler, NP  diclofenac sodium (VOLTAREN) 1 % GEL APPLY 2 TO 4 GRAMS EXTERNALLY TO THE AFFECTED AREA TWICE DAILY Patient not taking: Reported on 05/10/2017 03/17/17   [name], MD  HYDROcodone-acetaminophen (NORCO/VICODIN) 5-325 MG tablet Take 1 tablet by mouth every 6 (six) hours as needed for moderate pain. Patient not taking: Reported on 05/10/2017 03/18/17   [name], MD  varenicline (CHANTIX) 0.5 MG tablet Take 1 tablet (0.5 mg total) by mouth 2 (two) times daily. Patient not taking: Reported on 05/10/2017 02/04/17   [name], MD       Physical Exam: BP (!) 143/78 (BP Location: Left Arm)   Pulse (!) 106   Temp 97.8 F (36.6 C) (Axillary)   Resp (!) 27   Ht 6' (1.829 m)   Wt 102.1 kg (225 lb 1.4 oz)   SpO2 99%   BMI 30.53 kg/m  General appearance: Well-developed, adult male, alert and in distress from dyspnea, on BiPAP.   Eyes: Anicteric, conjunctiva pink, lids and lashes normal. PERRL.    ENT: No nasal deformity, discharge, epistaxis.  Hearing normal. OP dry without lesions.   Neck: No neck masses.  Trachea midline.  No thyromegaly/tenderness. Lymph: No cervical or supraclavicular lymphadenopathy. Skin: Warm and dry.  No jaundice.  No suspicious rashes or lesions. Cardiac: Tachycardic, nl S1-S2, no murmurs appreciated.  Capillary refill is brisk.  JVP not visible.  No LE edema.  Radial and DP pulses 2+ and symmetric. Respiratory: Tachypneic, working hard to breath, speaks in short sentences.  Lungs very tight.   Abdomen: Abdomen soft.  No TTP. No ascites, distension, hepatosplenomegaly.   MSK: No deformities or effusions.  No cyanosis or clubbing. Neuro: Cranial nerves normal.  Sensation intact to light touch. Speech is fluent.  Muscle strength normal.    Psych: Sensorium intact and responding to questions, attention normal.  Behavior appropriate.  Affect anxious.  Judgment and insight appear normal.     Labs on  Admission:  I have personally reviewed following labs and imaging studies: CBC:  Recent Labs Lab 05/10/17 1647  WBC 9.8  NEUTROABS 7.8*  HGB 18.2*  HCT 54.9*  MCV 101.1*  PLT 229   Basic Metabolic Panel:  Recent Labs Lab 05/10/17 1647  NA 139  K 4.2  CL 96*  CO2 34*  GLUCOSE 151*  BUN 17  CREATININE 0.65  CALCIUM 9.6   GFR: Estimated Creatinine Clearance: 133.5 mL/min (by C-G formula based on SCr of 0.65 mg/dL).  Liver Function Tests: No results for input(s): AST, ALT, ALKPHOS, BILITOT, PROT, ALBUMIN in the last 168 hours. No results for input(s): LIPASE, AMYLASE in the last 168 hours. No results for input(s): AMMONIA in the last 168 hours. Coagulation Profile: No results for input(s): INR, PROTIME in the last 168 hours. Cardiac Enzymes: No results for input(s): CKTOTAL, CKMB, CKMBINDEX, TROPONINI in the last 168 hours. BNP (last 3 results) No results for input(s): PROBNP in the last 8760 hours. HbA1C: No results for input(s): HGBA1C in the last 72 hours. CBG:  Recent Labs Lab 05/10/17 2322  GLUCAP 196*   Lipid Profile: No results for input(s): CHOL, HDL, LDLCALC, TRIG, CHOLHDL, LDLDIRECT in the last 72 hours. Thyroid Function Tests: No results for input(s): TSH, T4TOTAL, FREET4, T3FREE, THYROIDAB in the last 72 hours. Anemia Panel: No results for input(s): VITAMINB12, FOLATE, FERRITIN, TIBC, IRON, RETICCTPCT in the last 72 hours. Sepsis Labs: Lactic acid 1.2 Invalid input(s): PROCALCITONIN, LACTICIDVEN No results found for this or any previous visit (from the past 240 hour(s)).       Radiological Exams on Admission: Personally reviewed CXR shows no opacity, bullous emphysema: Dg Chest Port 1 View  Result Date: 05/10/2017 CLINICAL DATA:  Shortness of Breath EXAM: PORTABLE CHEST 1 VIEW COMPARISON:  July 02, 2016 FINDINGS: There is bullous disease in the upper lobes bilaterally. Interstitial prominence elsewhere in the lungs in part is due to  redistribution of blood flow to viable lung segments. There are scattered areas of scarring in both mid and lower lung zones. There is no frank edema or consolidation. The heart size and pulmonary vascularity are normal. There is stable decreased vascularity in the upper lobes consistent with the underlying bullous disease. No adenopathy. No bone lesions. IMPRESSION: Upper lobe bullous disease. Areas of mild scarring. No edema or consolidation. Electronically Signed   By: Lowella Grip III M.D.   On: 05/10/2017 17:01    EKG: Independently reviewed. Rate 101, nonspecific T wave changes.      Assessment/Plan Active Problems:   COPD exacerbation ([place])  1. COPD exacerbation with hypercarbic and hypoxic respiratory failure:    -Solumedrol 40 BID -Nebulized bronchodilators -Continue BiPAP -ABG in AM -Continue Breo   2. Pneumonia:  -Stop [place] last two days of treatment with CTX and azithromycin  3. Seizures:  -Continue Vimpat, gabapentin  4. Anxiety:  -Continue Depakote, bupropion -Lorazepam PRN for anxiety  5. Lung nodules:  -Follow up in 12 months with CT chest  6. Hypertension:  -Continue lisinopril, HCTZ      DVT prophylaxis: Lovenox  Code Status: FULL  Family Communication: Wife at bedside  Disposition Plan: Anticipate IV steroids, bronchodilators Consults called: None Admission status: INPATIENT   Medical decision making: Patient seen at 9:45 PM on 05/10/2017.  The patient was discussed with Janetta Hora, PA-C.  What exists of the patient's chart was reviewed in depth and summarized above.  Clinical condition: imrpoving with BiPAP, appearing more calm.        Edwin Dada [facility] Pager [phone]    At the time of admission, it appears that the appropriate admission status for this patient is INPATIENT. This is judged to be reasonable and necessary in order to provide the required intensity of service to  ensure the patient's  safety given the presenting symptoms, physical exam findings, and initial radiographic and laboratory data in the context of their chronic comorbidities.  Together, these circumstances are felt to place hiim at high risk for further clinical deterioration threatening life, limb, or organ.   Patient requires inpatient status due to high intensity of service, high risk for further deterioration and high frequency of surveillance required because of this severe exacerbation of their chronic organ failure requiring BiPAP and with respiratory acidosis.  I certify that at the point of admission it is my clinical judgment that the patient will require inpatient hospital care spanning beyond 2 midnights from the point of admission and that early discharge would result in unnecessary risk of decompensation and readmission or threat to life, limb or bodily function.

## 2017-05-10 NOTE — ED Notes (Signed)
EMS gave Solumedrol 125 mg, 3 neb treatments, albuterol x 5 mg each, Atrovent 1 mg, Mag Sulfate 2mg .

## 2017-05-11 ENCOUNTER — Inpatient Hospital Stay (HOSPITAL_COMMUNITY): Payer: Medicare Other

## 2017-05-11 ENCOUNTER — Encounter (HOSPITAL_COMMUNITY): Payer: Self-pay | Admitting: Family Medicine

## 2017-05-11 DIAGNOSIS — J189 Pneumonia, unspecified organism: Secondary | ICD-10-CM | POA: Diagnosis present

## 2017-05-11 DIAGNOSIS — R0602 Shortness of breath: Secondary | ICD-10-CM

## 2017-05-11 DIAGNOSIS — I10 Essential (primary) hypertension: Secondary | ICD-10-CM | POA: Diagnosis present

## 2017-05-11 DIAGNOSIS — J441 Chronic obstructive pulmonary disease with (acute) exacerbation: Principal | ICD-10-CM

## 2017-05-11 LAB — CBC
HCT: 51.5 % (ref 39.0–52.0)
HEMOGLOBIN: 16.9 g/dL (ref 13.0–17.0)
MCH: 33.2 pg (ref 26.0–34.0)
MCHC: 32.8 g/dL (ref 30.0–36.0)
MCV: 101.2 fL — ABNORMAL HIGH (ref 78.0–100.0)
PLATELETS: 148 10*3/uL — AB (ref 150–400)
RBC: 5.09 MIL/uL (ref 4.22–5.81)
RDW: 13.2 % (ref 11.5–15.5)
WBC: 10.1 10*3/uL (ref 4.0–10.5)

## 2017-05-11 LAB — POCT I-STAT 3, ART BLOOD GAS (G3+)
Acid-Base Excess: 9 mmol/L — ABNORMAL HIGH (ref 0.0–2.0)
Bicarbonate: 40.3 mmol/L — ABNORMAL HIGH (ref 20.0–28.0)
O2 SAT: 95 %
PCO2 ART: 83.7 mmHg — AB (ref 32.0–48.0)
PH ART: 7.291 — AB (ref 7.350–7.450)
TCO2: 43 mmol/L (ref 0–100)
pO2, Arterial: 87 mmHg (ref 83.0–108.0)

## 2017-05-11 LAB — GLUCOSE, CAPILLARY
GLUCOSE-CAPILLARY: 163 mg/dL — AB (ref 65–99)
Glucose-Capillary: 170 mg/dL — ABNORMAL HIGH (ref 65–99)
Glucose-Capillary: 149 mg/dL — ABNORMAL HIGH (ref 65–99)
Glucose-Capillary: 172 mg/dL — ABNORMAL HIGH (ref 65–99)

## 2017-05-11 LAB — BLOOD GAS, ARTERIAL
ACID-BASE EXCESS: 14.2 mmol/L — AB (ref 0.0–2.0)
Bicarbonate: 41.1 mmol/L — ABNORMAL HIGH (ref 20.0–28.0)
DELIVERY SYSTEMS: POSITIVE
Drawn by: 23604
EXPIRATORY PAP: 8
FIO2: 100
INSPIRATORY PAP: 16
O2 Saturation: 99.3 %
PH ART: 7.299 — AB (ref 7.350–7.450)
Patient temperature: 97.9
RATE: 12 resp/min
pCO2 arterial: 85.8 mmHg (ref 32.0–48.0)
pO2, Arterial: 255 mmHg — ABNORMAL HIGH (ref 83.0–108.0)

## 2017-05-11 LAB — BASIC METABOLIC PANEL
ANION GAP: 9 (ref 5–15)
BUN: 16 mg/dL (ref 6–20)
CALCIUM: 9.1 mg/dL (ref 8.9–10.3)
CO2: 36 mmol/L — AB (ref 22–32)
CREATININE: 0.59 mg/dL — AB (ref 0.61–1.24)
Chloride: 96 mmol/L — ABNORMAL LOW (ref 101–111)
GLUCOSE: 159 mg/dL — AB (ref 65–99)
Potassium: 4.3 mmol/L (ref 3.5–5.1)
Sodium: 141 mmol/L (ref 135–145)

## 2017-05-11 LAB — PHOSPHORUS
PHOSPHORUS: 3.6 mg/dL (ref 2.5–4.6)
PHOSPHORUS: 2.2 mg/dL — AB (ref 2.5–4.6)

## 2017-05-11 LAB — MAGNESIUM
MAGNESIUM: 2.1 mg/dL (ref 1.7–2.4)
Magnesium: 2.2 mg/dL (ref 1.7–2.4)

## 2017-05-11 LAB — HIV ANTIBODY (ROUTINE TESTING W REFLEX): HIV SCREEN 4TH GENERATION: NONREACTIVE

## 2017-05-11 LAB — MRSA PCR SCREENING: MRSA BY PCR: POSITIVE — AB

## 2017-05-11 MED ORDER — DEXTROSE 5 % IV SOLN
500.0000 mg | INTRAVENOUS | Status: DC
  Administered 2017-05-12: 500 mg via INTRAVENOUS
  Filled 2017-05-11: qty 500

## 2017-05-11 MED ORDER — MUPIROCIN 2 % EX OINT
1.0000 "application " | TOPICAL_OINTMENT | Freq: Two times a day (BID) | CUTANEOUS | Status: AC
  Administered 2017-05-11 – 2017-05-15 (×10): 1 via NASAL
  Filled 2017-05-11: qty 22

## 2017-05-11 MED ORDER — ALBUTEROL SULFATE (2.5 MG/3ML) 0.083% IN NEBU
2.5000 mg | INHALATION_SOLUTION | Freq: Four times a day (QID) | RESPIRATORY_TRACT | Status: DC
  Administered 2017-05-11 – 2017-05-27 (×65): 2.5 mg via RESPIRATORY_TRACT
  Filled 2017-05-11 (×66): qty 3

## 2017-05-11 MED ORDER — DIVALPROEX SODIUM ER 500 MG PO TB24
1000.0000 mg | ORAL_TABLET | Freq: Every day | ORAL | Status: DC
  Filled 2017-05-11 (×2): qty 2

## 2017-05-11 MED ORDER — BUPROPION HCL ER (SR) 150 MG PO TB12
150.0000 mg | ORAL_TABLET | Freq: Two times a day (BID) | ORAL | Status: DC
  Filled 2017-05-11: qty 1

## 2017-05-11 MED ORDER — AZITHROMYCIN 500 MG PO TABS
500.0000 mg | ORAL_TABLET | ORAL | Status: DC
  Administered 2017-05-11: 500 mg via ORAL
  Filled 2017-05-11: qty 1

## 2017-05-11 MED ORDER — PANTOPRAZOLE SODIUM 40 MG IV SOLR: 40.0000 mg | Freq: Every day | INTRAVENOUS | Status: DC

## 2017-05-11 MED ORDER — METHYLPREDNISOLONE SODIUM SUCC 40 MG IJ SOLR
40.0000 mg | Freq: Two times a day (BID) | INTRAMUSCULAR | Status: DC
  Administered 2017-05-11: 40 mg via INTRAVENOUS
  Filled 2017-05-11: qty 1

## 2017-05-11 MED ORDER — SODIUM CHLORIDE 0.9 % IV SOLN
0.0000 ug/min | INTRAVENOUS | Status: DC
  Administered 2017-05-11: 10 ug/min via INTRAVENOUS
  Administered 2017-05-11: 25 ug/min via INTRAVENOUS
  Administered 2017-05-12: 15 ug/min via INTRAVENOUS
  Administered 2017-05-16: 20 ug/min via INTRAVENOUS
  Administered 2017-05-17: 15 ug/min via INTRAVENOUS
  Administered 2017-05-17 – 2017-05-18 (×2): 20 ug/min via INTRAVENOUS
  Administered 2017-05-18 (×2): 15 ug/min via INTRAVENOUS
  Administered 2017-05-19: 5 ug/min via INTRAVENOUS
  Administered 2017-05-20: 15 ug/min via INTRAVENOUS
  Filled 2017-05-11 (×11): qty 1

## 2017-05-11 MED ORDER — ONDANSETRON HCL 4 MG PO TABS: 4.0000 mg | ORAL_TABLET | Freq: Four times a day (QID) | ORAL | Status: DC | PRN

## 2017-05-11 MED ORDER — MIDAZOLAM HCL 2 MG/2ML IJ SOLN
INTRAMUSCULAR | Status: AC
  Administered 2017-05-11: 4 mg via INTRAVENOUS
  Filled 2017-05-11: qty 4

## 2017-05-11 MED ORDER — ONDANSETRON HCL 4 MG/2ML IJ SOLN: 4.0000 mg | Freq: Four times a day (QID) | INTRAMUSCULAR | Status: DC | PRN

## 2017-05-11 MED ORDER — ALBUTEROL SULFATE (2.5 MG/3ML) 0.083% IN NEBU
2.5000 mg | INHALATION_SOLUTION | RESPIRATORY_TRACT | Status: DC | PRN
  Administered 2017-05-11 (×2): 2.5 mg via RESPIRATORY_TRACT
  Filled 2017-05-11 (×2): qty 3

## 2017-05-11 MED ORDER — VITAL HIGH PROTEIN PO LIQD
1000.0000 mL | ORAL | Status: DC
  Administered 2017-05-11 (×5)
  Administered 2017-05-11: 1000 mL
  Administered 2017-05-11: 13:00:00
  Administered 2017-05-12: 1000 mL

## 2017-05-11 MED ORDER — ORAL CARE MOUTH RINSE
15.0000 mL | Freq: Two times a day (BID) | OROMUCOSAL | Status: DC
  Administered 2017-05-11 (×2): 15 mL via OROMUCOSAL

## 2017-05-11 MED ORDER — DICLOFENAC SODIUM 1 % TD GEL
2.0000 g | Freq: Four times a day (QID) | TRANSDERMAL | Status: DC
  Filled 2017-05-11: qty 100

## 2017-05-11 MED ORDER — CHLORHEXIDINE GLUCONATE 0.12 % MT SOLN
15.0000 mL | Freq: Two times a day (BID) | OROMUCOSAL | Status: DC
  Administered 2017-05-11: 15 mL via OROMUCOSAL

## 2017-05-11 MED ORDER — LORAZEPAM 2 MG/ML IJ SOLN
0.5000 mg | Freq: Once | INTRAMUSCULAR | Status: AC
  Administered 2017-05-11: 0.5 mg via INTRAVENOUS
  Filled 2017-05-11: qty 1

## 2017-05-11 MED ORDER — LACOSAMIDE 50 MG PO TABS
100.0000 mg | ORAL_TABLET | Freq: Two times a day (BID) | ORAL | Status: DC
  Administered 2017-05-11 – 2017-05-19 (×17): 100 mg
  Filled 2017-05-11 (×17): qty 2

## 2017-05-11 MED ORDER — FENTANYL BOLUS VIA INFUSION
50.0000 ug | INTRAVENOUS | Status: DC | PRN
  Filled 2017-05-11: qty 50

## 2017-05-11 MED ORDER — ACETAMINOPHEN 650 MG RE SUPP
650.0000 mg | Freq: Four times a day (QID) | RECTAL | Status: DC | PRN
  Administered 2017-05-11: 650 mg via RECTAL
  Filled 2017-05-11: qty 1

## 2017-05-11 MED ORDER — DEXTROSE 5 % IV SOLN
2.0000 g | Freq: Three times a day (TID) | INTRAVENOUS | Status: DC
  Administered 2017-05-11 – 2017-05-15 (×13): 2 g via INTRAVENOUS
  Filled 2017-05-11 (×14): qty 2

## 2017-05-11 MED ORDER — VANCOMYCIN HCL 10 G IV SOLR
2000.0000 mg | Freq: Once | INTRAVENOUS | Status: AC
  Administered 2017-05-11: 2000 mg via INTRAVENOUS
  Filled 2017-05-11: qty 2000

## 2017-05-11 MED ORDER — PANTOPRAZOLE SODIUM 40 MG PO PACK
40.0000 mg | PACK | Freq: Every day | ORAL | Status: DC
  Administered 2017-05-12 – 2017-05-16 (×5): 40 mg
  Filled 2017-05-11 (×5): qty 20

## 2017-05-11 MED ORDER — MIDAZOLAM HCL 2 MG/2ML IJ SOLN
4.0000 mg | Freq: Once | INTRAMUSCULAR | Status: AC
  Administered 2017-05-11: 4 mg via INTRAVENOUS

## 2017-05-11 MED ORDER — ACETAMINOPHEN 325 MG PO TABS
650.0000 mg | ORAL_TABLET | Freq: Four times a day (QID) | ORAL | Status: DC | PRN
  Administered 2017-05-16 – 2017-05-17 (×3): 650 mg via ORAL
  Filled 2017-05-11 (×3): qty 2

## 2017-05-11 MED ORDER — LACOSAMIDE 50 MG PO TABS
100.0000 mg | ORAL_TABLET | Freq: Two times a day (BID) | ORAL | Status: DC
  Administered 2017-05-11: 100 mg via ORAL
  Filled 2017-05-11: qty 2

## 2017-05-11 MED ORDER — VANCOMYCIN HCL IN DEXTROSE 1-5 GM/200ML-% IV SOLN
1000.0000 mg | Freq: Two times a day (BID) | INTRAVENOUS | Status: DC
  Administered 2017-05-11 – 2017-05-12 (×3): 1000 mg via INTRAVENOUS
  Filled 2017-05-11 (×5): qty 200

## 2017-05-11 MED ORDER — CHLORHEXIDINE GLUCONATE CLOTH 2 % EX PADS
6.0000 | MEDICATED_PAD | Freq: Every day | CUTANEOUS | Status: AC
  Administered 2017-05-11 – 2017-05-14 (×4): 6 via TOPICAL

## 2017-05-11 MED ORDER — METHYLPREDNISOLONE SODIUM SUCC 125 MG IJ SOLR
80.0000 mg | Freq: Three times a day (TID) | INTRAMUSCULAR | Status: DC
  Administered 2017-05-11 – 2017-05-15 (×12): 80 mg via INTRAVENOUS
  Filled 2017-05-11: qty 2
  Filled 2017-05-11 (×12): qty 1.28

## 2017-05-11 MED ORDER — GABAPENTIN 250 MG/5ML PO SOLN
300.0000 mg | Freq: Three times a day (TID) | ORAL | Status: DC
  Administered 2017-05-11 – 2017-05-27 (×42): 300 mg
  Filled 2017-05-11 (×49): qty 6

## 2017-05-11 MED ORDER — VALPROATE SODIUM 250 MG/5ML PO SOLN
250.0000 mg | Freq: Two times a day (BID) | ORAL | Status: DC
  Filled 2017-05-11: qty 5

## 2017-05-11 MED ORDER — CHLORHEXIDINE GLUCONATE 0.12% ORAL RINSE (MEDLINE KIT)
15.0000 mL | Freq: Two times a day (BID) | OROMUCOSAL | Status: DC
  Administered 2017-05-11 – 2017-05-12 (×2): 15 mL via OROMUCOSAL

## 2017-05-11 MED ORDER — PROPOFOL 1000 MG/100ML IV EMUL
INTRAVENOUS | Status: AC
  Administered 2017-05-11: 5 ug/kg/min via INTRAVENOUS
  Filled 2017-05-11: qty 100

## 2017-05-11 MED ORDER — PRO-STAT SUGAR FREE PO LIQD
30.0000 mL | Freq: Two times a day (BID) | ORAL | Status: DC
  Administered 2017-05-11 – 2017-05-12 (×3): 30 mL
  Filled 2017-05-11 (×3): qty 30

## 2017-05-11 MED ORDER — PROPOFOL 1000 MG/100ML IV EMUL
0.0000 ug/kg/min | INTRAVENOUS | Status: DC
  Administered 2017-05-11: 5 ug/kg/min via INTRAVENOUS
  Administered 2017-05-12: 10 ug/kg/min via INTRAVENOUS
  Administered 2017-05-12: 30 ug/kg/min via INTRAVENOUS
  Administered 2017-05-13: 20 ug/kg/min via INTRAVENOUS
  Administered 2017-05-13: 30 ug/kg/min via INTRAVENOUS
  Administered 2017-05-13 – 2017-05-14 (×3): 20 ug/kg/min via INTRAVENOUS
  Filled 2017-05-11 (×9): qty 100

## 2017-05-11 MED ORDER — INSULIN ASPART 100 UNIT/ML ~~LOC~~ SOLN
0.0000 [IU] | SUBCUTANEOUS | Status: DC
  Administered 2017-05-11 (×2): 2 [IU] via SUBCUTANEOUS
  Administered 2017-05-11: 1 [IU] via SUBCUTANEOUS
  Administered 2017-05-11 – 2017-05-13 (×8): 2 [IU] via SUBCUTANEOUS
  Administered 2017-05-13: 3 [IU] via SUBCUTANEOUS
  Administered 2017-05-13: 5 [IU] via SUBCUTANEOUS

## 2017-05-11 MED ORDER — VALPROATE SODIUM 250 MG/5ML PO SOLN
500.0000 mg | Freq: Two times a day (BID) | ORAL | Status: DC
  Administered 2017-05-11 – 2017-05-19 (×18): 500 mg
  Filled 2017-05-11 (×19): qty 10

## 2017-05-11 MED ORDER — LISINOPRIL 20 MG PO TABS
20.0000 mg | ORAL_TABLET | Freq: Every day | ORAL | Status: DC
  Filled 2017-05-11: qty 1

## 2017-05-11 MED ORDER — LORAZEPAM 2 MG/ML IJ SOLN
1.0000 mg | Freq: Once | INTRAMUSCULAR | Status: AC
  Administered 2017-05-11: 1 mg via INTRAVENOUS

## 2017-05-11 MED ORDER — FENTANYL CITRATE (PF) 100 MCG/2ML IJ SOLN
100.0000 ug | INTRAMUSCULAR | Status: DC | PRN
  Administered 2017-05-11 (×2): 100 ug via INTRAVENOUS
  Filled 2017-05-11 (×2): qty 2

## 2017-05-11 MED ORDER — ENOXAPARIN SODIUM 40 MG/0.4ML ~~LOC~~ SOLN
40.0000 mg | SUBCUTANEOUS | Status: DC
  Administered 2017-05-11 – 2017-05-13 (×3): 40 mg via SUBCUTANEOUS
  Filled 2017-05-11 (×3): qty 0.4

## 2017-05-11 MED ORDER — ROCURONIUM BROMIDE 50 MG/5ML IV SOLN
50.0000 mg | Freq: Once | INTRAVENOUS | Status: AC
  Administered 2017-05-11: 50 mg via INTRAVENOUS

## 2017-05-11 MED ORDER — MIDAZOLAM HCL 2 MG/2ML IJ SOLN
2.0000 mg | INTRAMUSCULAR | Status: DC | PRN
  Administered 2017-05-11 – 2017-05-20 (×20): 2 mg via INTRAVENOUS
  Filled 2017-05-11 (×18): qty 2

## 2017-05-11 MED ORDER — ORAL CARE MOUTH RINSE
15.0000 mL | Freq: Four times a day (QID) | OROMUCOSAL | Status: DC
  Administered 2017-05-12 (×2): 15 mL via OROMUCOSAL

## 2017-05-11 MED ORDER — FENTANYL CITRATE (PF) 100 MCG/2ML IJ SOLN
200.0000 ug | Freq: Once | INTRAMUSCULAR | Status: AC
  Administered 2017-05-11: 200 ug via INTRAVENOUS

## 2017-05-11 MED ORDER — GABAPENTIN 300 MG PO CAPS
300.0000 mg | ORAL_CAPSULE | Freq: Three times a day (TID) | ORAL | Status: DC
  Administered 2017-05-11: 300 mg via ORAL
  Filled 2017-05-11: qty 1

## 2017-05-11 MED ORDER — FENTANYL CITRATE (PF) 100 MCG/2ML IJ SOLN
INTRAMUSCULAR | Status: AC
  Administered 2017-05-11: 200 ug via INTRAVENOUS
  Filled 2017-05-11: qty 4

## 2017-05-11 MED ORDER — LISINOPRIL-HYDROCHLOROTHIAZIDE 20-25 MG PO TABS: 1.0000 | ORAL_TABLET | Freq: Every day | ORAL | Status: DC

## 2017-05-11 MED ORDER — HYDROCHLOROTHIAZIDE 25 MG PO TABS
25.0000 mg | ORAL_TABLET | Freq: Every day | ORAL | Status: DC
  Filled 2017-05-11: qty 1

## 2017-05-11 MED ORDER — HALOPERIDOL LACTATE 5 MG/ML IJ SOLN
1.0000 mg | Freq: Once | INTRAMUSCULAR | Status: AC
  Administered 2017-05-11: 1 mg via INTRAVENOUS
  Filled 2017-05-11: qty 1

## 2017-05-11 MED ORDER — FLUTICASONE FUROATE-VILANTEROL 200-25 MCG/INH IN AEPB
1.0000 | INHALATION_SPRAY | Freq: Every day | RESPIRATORY_TRACT | Status: DC
  Administered 2017-05-11: 08:00:00 1 via RESPIRATORY_TRACT
  Filled 2017-05-11: qty 28

## 2017-05-11 MED ORDER — DEXTROSE 5 % IV SOLN
1.0000 g | INTRAVENOUS | Status: DC
  Administered 2017-05-11: 1 g via INTRAVENOUS
  Filled 2017-05-11: qty 10

## 2017-05-11 MED ORDER — SODIUM CHLORIDE 0.9 % IV BOLUS (SEPSIS)
1000.0000 mL | Freq: Once | INTRAVENOUS | Status: AC
  Administered 2017-05-11: 1000 mL via INTRAVENOUS

## 2017-05-11 MED ORDER — FENTANYL 2500MCG IN NS 250ML (10MCG/ML) PREMIX INFUSION
25.0000 ug/h | INTRAVENOUS | Status: DC
  Administered 2017-05-11: 100 ug/h via INTRAVENOUS
  Administered 2017-05-12: 300 ug/h via INTRAVENOUS
  Administered 2017-05-12: 200 ug/h via INTRAVENOUS
  Administered 2017-05-12 – 2017-05-13 (×2): 100 ug/h via INTRAVENOUS
  Administered 2017-05-14: 300 ug/h via INTRAVENOUS
  Administered 2017-05-14: 200 ug/h via INTRAVENOUS
  Administered 2017-05-15: 100 ug/h via INTRAVENOUS
  Administered 2017-05-16: 225 ug/h via INTRAVENOUS
  Administered 2017-05-16: 100 ug/h via INTRAVENOUS
  Administered 2017-05-17: 250 ug/h via INTRAVENOUS
  Administered 2017-05-17: 100 ug/h via INTRAVENOUS
  Administered 2017-05-18: 300 ug/h via INTRAVENOUS
  Administered 2017-05-18: 350 ug/h via INTRAVENOUS
  Administered 2017-05-18: 300 ug/h via INTRAVENOUS
  Administered 2017-05-19: 200 ug/h via INTRAVENOUS
  Administered 2017-05-19: 400 ug/h via INTRAVENOUS
  Administered 2017-05-19: 350 ug/h via INTRAVENOUS
  Administered 2017-05-20: 100 ug/h via INTRAVENOUS
  Administered 2017-05-20: 200 ug/h via INTRAVENOUS
  Filled 2017-05-11 (×21): qty 250

## 2017-05-11 MED ORDER — LORAZEPAM 2 MG/ML IJ SOLN: 1.0000 mg | Freq: Once | INTRAMUSCULAR | Status: DC

## 2017-05-11 MED ORDER — MELOXICAM 7.5 MG PO TABS
15.0000 mg | ORAL_TABLET | Freq: Every day | ORAL | Status: DC
  Filled 2017-05-11: qty 2

## 2017-05-11 MED ORDER — FENTANYL CITRATE (PF) 100 MCG/2ML IJ SOLN
100.0000 ug | INTRAMUSCULAR | Status: DC | PRN
  Administered 2017-05-12: 100 ug via INTRAVENOUS
  Filled 2017-05-11: qty 2

## 2017-05-11 MED ORDER — NICOTINE 21 MG/24HR TD PT24
21.0000 mg | MEDICATED_PATCH | Freq: Every day | TRANSDERMAL | Status: DC
  Administered 2017-05-11: 21 mg via TRANSDERMAL
  Filled 2017-05-11: qty 1

## 2017-05-11 NOTE — Procedures (Signed)
Bronchoscopy Procedure Note Harold Bailey [id number] Jun 03, 1966  Procedure: Bronchoscopy Indications: Diagnostic evaluation of the airways, Obtain specimens for culture and/or other diagnostic studies and Remove secretions  Procedure Details Consent: Unable to obtain consent because of emergent medical necessity. Time Out: Verified patient identification, verified procedure, site/side was marked, verified correct patient position, special equipment/implants available, medications/allergies/relevent history reviewed, required imaging and test results available.  Performed  In preparation for procedure, patient was given 100% FiO2 and bronchoscope lubricated. Sedation: propofol  Airway entered and the following bronchi were examined: RUL, RML, RLL, LUL, LLL and Bronchi.   Procedures performed: Brushings NOt perfromed Bronchoscope removed.  , Patient placed back on 100% FiO2 at conclusion of procedure.    Evaluation Hemodynamic Status: BP stable throughout; O2 sats: stable throughout Patient's Current Condition: stable Specimens:  Sent purulent fluid Complications: No apparent complications Patient did tolerate procedure well.   Raylene Miyamoto. 05/11/2017   1. Tracheal about 50% obstructed by thick white cream pus- suctioned clear 2 obstructed left main and al lobes thick pus- suctioned clear 3. RUL same as above, cleared 4. BAL upper division thick pus 5. No lesions 6 ett wnl 3 cm above carina  Lavon Paganini. Titus Mould, MD, [place] Pgr: [facility]

## 2017-05-11 NOTE — Progress Notes (Addendum)
Called by RN at [phone] for recent ABG results,  increased agitation and AMS.Given additional ativan at that time.  At the bedside at 0600. Patient is confused, pulling at lines, unable to keep BIPAP machine on stating he can not breath with it on and attempting to get out of bed. Breath sounds diminished and wet cough is noted. Given haldol at 0630 for increased agitation. Respiratory called for duoneb tx.  CXR ordered.   Assessment/plan:  COPD: Continue BiPAP, CXR orderd, duoneb tx by respiratory.  Anxiety: Continue ativan PRN. Given 1x dose of Haldol.

## 2017-05-11 NOTE — Progress Notes (Signed)
Patient was brought in this morning from another unit with respiratory failure requiring intubation. Started on propofol and later fentanyl . Patient became hypotensive post intubation, order for one little bolus normal saline. After adding fentanyl because patient was on 50 of propofol and still agitated and fighting the vent. Patient responded well but became hypotensive again. MD notified, order for another little of bolus and started patient on neo-phene phrine. Patient appear very stable and is resting comfortably. I have since  wean him off the propofol and currently managing sedation just with fentanyl.   Patient had a condom cath but have not urinated since he came to [place]. I scanned his bladder and he had above 480 present. Foley is inserted and MD notified.

## 2017-05-11 NOTE — Progress Notes (Signed)
Seen on unit  Very SOB and anxious Cyanotic on face mask--had to be put back on BIpap 18 BP 137/85   Pulse (!) 112   Temp 97.6 F (36.4 C) (Axillary)   Resp 18   Ht 6' (1.829 m)   Wt 102.1 kg (225 lb 1.4 oz)   SpO2 99%   BMI 30.53 kg/m  Cannot catch breath AE negligible--coughing  Patient status and ABG indicative of worsening resp status--will probably need Intubation and ICU level Care  Dr. Titus Mould aware and input appreciated Care ceded to CCM  Verneita Griffes, MD [facility] [phone]

## 2017-05-11 NOTE — Consult Note (Addendum)
PULMONARY / CRITICAL CARE MEDICINE   Name: Harold Bailey MRN: [id number] DOB: [birth year]    ADMISSION DATE:  05/10/2017 CONSULTATION DATE:  05/11/17  REFERRING MD:  Dr. Loleta Books  CHIEF COMPLAINT:  Resp Distress   HISTORY OF PRESENT ILLNESS:   51 yo male smoker with hx of COPD/Asthma , Seizure disorder, SVT , Bipolar, substance abuse with recent admission to outlying hospital with chest pain. He had SVT, cardioverted with Adenosine . LHC showed nonobstructive CAD. He developed bronchitic symptoms during this admission and was treated with prednisone and omnicef. CXR and CT chest were reported as neg for PNA. 2 days prior to admission he developed progressively worsening sob, cough and wheezing . He was brought to ER on 8/18 and admitted by [facility] . He was started on solumedrol, nebs, and abx. LA was normal . CXR showed on pna.  Early am on 819 with progressive decline with hypoxia, increased wob and lethargy . PCCM asked to consult. Pt was moved to ICU and intubated. Bedside bronchoscopy showed very thick airway secretions. Airway .   PAST MEDICAL HISTORY :  He  has a past medical history of Anxiety; Arthritis; COPD (chronic obstructive pulmonary disease) ([place]); Drug abuse; Gait disturbance; GERD (gastroesophageal reflux disease); ASNKNLZJ(673.4) (2009); Hyperlipidemia; Hypertension; Memory deficit; Memory loss; Peripheral vision loss; Pneumonia; Polycythemia; Prediabetes; Seizures ([place]) (11/19/2013); and Shortness of breath.  PAST SURGICAL HISTORY: He  has a past surgical history that includes Ankle surgery (2003); Shoulder arthroscopy (2008); Brain surgery (2009); Fracture surgery (2003); Tympanostomy tube placement (1993); Ankle arthroscopy (01/17/2012); Inner ear surgery; Ankle Fusion (Left, 11/2012); ORIF ankle fracture (Left, 12/08/2012); Hardware Removal (Left, 12/08/2012); I&D extremity (Left, 01/01/2013); Open reduction internal fixation (orif) scaphoid with distal radius graft (Right,  02/01/2013); Scaphoid fracture surgery (02/04/2013); ORIF ankle fracture (Right, 09/03/2013); and Ankle arthroscopy with fusion (Left, 10/02/2016).  Allergies  Allergen Reactions  . Hydrocodone Rash  . No Known Allergies     No current facility-administered medications on file prior to encounter.    Current Outpatient Prescriptions on File Prior to Encounter  Medication Sig  . acetaminophen (TYLENOL) 500 MG tablet Take 1,500 mg by mouth every 6 (six) hours as needed for mild pain.   Marland Kitchen albuterol (PROVENTIL HFA;VENTOLIN HFA) 108 (90 Base) MCG/ACT inhaler Inhale 2 puffs into the lungs every 4 (four) hours as needed for wheezing or shortness of breath (cough, shortness of breath or wheezing.).  Marland Kitchen ALPRAZolam (XANAX) 1 MG tablet Take 1 mg by mouth at bedtime as needed for anxiety.  Marland Kitchen buPROPion (ZYBAN) 150 MG 12 hr tablet TAKE 1 TABLET(150 MG) BY MOUTH TWICE DAILY  . cyclobenzaprine (FLEXERIL) 10 MG tablet Take 10 mg by mouth 3 (three) times daily as needed for muscle spasms.  . diclofenac sodium (VOLTAREN) 1 % GEL APPLY 2 TO 4 GRAMS EXTERNALLY TO THE AFFECTED AREA TWICE DAILY  . divalproex (DEPAKOTE ER) 500 MG 24 hr tablet Take 2 tablets (1,000 mg total) by mouth daily.  Marland Kitchen docusate sodium (COLACE) 100 MG capsule Take 100 mg by mouth 2 (two) times daily as needed for mild constipation.   . Fluticasone-Salmeterol (ADVAIR DISKUS) 250-50 MCG/DOSE AEPB Inhale 1 puff into the lungs 2 (two) times daily.  Marland Kitchen gabapentin (NEURONTIN) 300 MG capsule TAKE 1 CAPSULE(300 MG) BY MOUTH THREE TIMES DAILY (Patient taking differently: TAKE 1 CAPSULE(300 MG) BY MOUTH TWICE DAILY)  . Lacosamide (VIMPAT) 100 MG TABS Take 1 tablet (100 mg total) by mouth 2 (two) times daily.  Marland Kitchen lisinopril-hydrochlorothiazide (PRINZIDE,ZESTORETIC)  20-25 MG tablet TAKE 1 TABLET BY MOUTH DAILY  . meloxicam (MOBIC) 15 MG tablet Take 15 mg by mouth daily.  . methocarbamol (ROBAXIN) 500 MG tablet TAKE 500MG BY MOUTH EVERY 6 TO 8 HOURS AS NEEDED  FOR MUSCLE SPASMS  . Multiple Vitamin (MULTIVITAMIN WITH MINERALS) TABS tablet Take 1 tablet by mouth daily.  . nabumetone (RELAFEN) 500 MG tablet Take 1 tablet (500 mg total) by mouth 2 (two) times daily as needed.  . naproxen (NAPROSYN) 500 MG tablet TAKE 1 TABLET(500 MG) BY MOUTH TWICE DAILY WITH MEALS AS NEEDED FOR MILD PAIN OR MODERATE PAIN  . traZODone (DESYREL) 50 MG tablet Take 1/2 tablet by mouth twice daily as needed for anxiety. Stop Date 11/23/16    FAMILY HISTORY:  His indicated that his mother is alive. He indicated that his father is alive. He indicated that the status of his maternal grandmother is unknown. He indicated that the status of his maternal aunt is unknown. He indicated that the status of his neg hx is unknown.    SOCIAL HISTORY: He  reports that he has been smoking Cigarettes.  He has a 7.50 pack-year smoking history. He has never used smokeless tobacco. He reports that he does not drink alcohol or use drugs.  REVIEW OF SYSTEMS:   Unable to obtain as sedated on intubated on vent   SUBJECTIVE:  resp distress   VITAL SIGNS: BP 137/85   Pulse (!) 114   Temp 97.6 F (36.4 C) (Axillary)   Resp 19   Ht 6' (1.829 m)   Wt 225 lb 1.4 oz (102.1 kg)   SpO2 96%   BMI 30.53 kg/m   HEMODYNAMICS:    VENTILATOR SETTINGS: Vent Mode: BIPAP;PCV FiO2 (%):  [40 %-50 %] 50 % Set Rate:  [12 bmp] 12 bmp PEEP:  [8 cmH20] 8 cmH20  INTAKE / OUTPUT: I/O last 3 completed shifts: In: 59 [IV Piggyback:50] Out: 500 [Urine:500]  PHYSICAL EXAMINATION: General:  Obese male sedated on vent  Neuro: sedated on vent  HEENT:  ETT in place  Cardiovascular:  ST , no m//r.g  Lungs: decreased aeration , wheezing ,  Poor air movement  Abdomen:  Obese , BS +  Musculoskeletal:  Intact  Skin:  No rash   LABS:  BMET  Recent Labs Lab 05/10/17 1647 05/11/17 0254  NA 139 141  K 4.2 4.3  CL 96* 96*  CO2 34* 36*  BUN 17 16  CREATININE 0.65 0.59*  GLUCOSE 151* 159*     Electrolytes  Recent Labs Lab 05/10/17 1647 05/11/17 0254  CALCIUM 9.6 9.1    CBC  Recent Labs Lab 05/10/17 1647 05/11/17 0254  WBC 9.8 10.1  HGB 18.2* 16.9  HCT 54.9* 51.5  PLT 165 148*    Coag's No results for input(s): APTT, INR in the last 168 hours.  Sepsis Markers  Recent Labs Lab 05/10/17 1957  LATICACIDVEN 1.70    ABG  Recent Labs Lab 05/10/17 1633 05/10/17 1826 05/11/17 0510  PHART 7.348* 7.349* 7.299*  PCO2ART 72.5* 71.6* 85.8*  PO2ART 102.0 74.0* 255*    Liver Enzymes No results for input(s): AST, ALT, ALKPHOS, BILITOT, ALBUMIN in the last 168 hours.  Cardiac Enzymes No results for input(s): TROPONINI, PROBNP in the last 168 hours.  Glucose  Recent Labs Lab 05/10/17 2322  GLUCAP 196*    Imaging Dg Chest 1 View  Result Date: 05/11/2017 CLINICAL DATA:  Increased shortness of breath EXAM: CHEST 1 VIEW COMPARISON:  Yesterday FINDINGS: COPD with emphysematous changes and generalized interstitial coarsening. No edema, effusion, or pneumothorax. Negative for collapse. Normal heart size. IMPRESSION: COPD without acute superimposed finding. Electronically Signed   By: Monte Fantasia M.D.   On: 05/11/2017 07:22   Dg Chest Port 1 View  Result Date: 05/10/2017 CLINICAL DATA:  Shortness of Breath EXAM: PORTABLE CHEST 1 VIEW COMPARISON:  July 02, 2016 FINDINGS: There is bullous disease in the upper lobes bilaterally. Interstitial prominence elsewhere in the lungs in part is due to redistribution of blood flow to viable lung segments. There are scattered areas of scarring in both mid and lower lung zones. There is no frank edema or consolidation. The heart size and pulmonary vascularity are normal. There is stable decreased vascularity in the upper lobes consistent with the underlying bullous disease. No adenopathy. No bone lesions. IMPRESSION: Upper lobe bullous disease. Areas of mild scarring. No edema or consolidation. Electronically Signed    By: Lowella Grip III M.D.   On: 05/10/2017 17:01     STUDIES:  Bronch 8/19- tracheal and left main and RUL collapse thick pus  CULTURES: 8/19 Sputum cx Bronch bal >> 8/19 bc>>> ANTIBIOTICS: 8/19 Vanc >> 8/19 Fortax  >>  SIGNIFICANT EVENTS: 8/19 ETT emergent, bronch with collapse  LINES/TUBES: 8/19 ETT >>  DISCUSSION: 51 yo male smoker with Bipolar/substance abuse admitted in COPD exacerbation with progressive decline with hypercarbic respiratory failure requiring intubation /vent support on 8/19 .  ASSESSMENT / PLAN:  PULMONARY A: Acute on slight chronic Hypercarbic /hypoxic respiratory failrue  COPD exacerbation  HCAP  P:   Vent support  VAP  Daily assess SBT/wean  Solumedrol 45m q 8  IV abx w/ Vanc/Rocephin  Sputum Cx  Duoneb q4  Check ABG in 1 hr  Check CXR and ABG in am    CARDIOVASCULAR A:  Sinus Tachycardia  Nonobstructive CAD on Cath early 04/2017  SVT episode s/p adenosine 1 week prior to admit  HTN  P:  Hold home meds (Lisinopril/HCTZ  )  Monitor   RENAL A:  Normal crt clearance  P:   Replace electrolytes as indicated   GASTROINTESTINAL A:   NPO  P:   PPI  Start feeds  HEMATOLOGIC A:  Secondary polycythemia dvt prevention  P:  Lovenox SQ  Allow pos balance  INFECTIOUS A:   COPD exacerbation \hcap P:   Check sputum cx - BAL bronch  Vanc /Fortaz  Send BC  ENDOCRINE A:    Hyperglycemia  P:   Add SSI as indicated   NEUROLOGIC A:   Seizure disorder, vent dyscherony P:   RASS goal: -1  Cont seizure regimen .   FAMILY  - Updates: non at bedside   - Inter-disciplinary family meet or Palliative Care meeting due by:  05/18/17    TRexene EdisonNP -C  Pulmonary and CWarwickPager: [phone] 05/11/2017, 8:47 AM   STAFF NOTE: I, DMerrie Roof MD FACP have personally reviewed patient's available data, including medical history, events of note, physical examination and  test results as part of my evaluation. I have discussed with resident/NP and other care providers such as pharmacist, RN and RRT. In addition, I personally evaluated patient and elicited key findings of: lethargic, severe resp distress, paradoxical breathing severe, perr, poor air movement diffuse with wheezing, abdo obese, no edema, pcxr which I reviewed showed chronic changes bases and copd, he requires emergent intubation, after intubation he has pus all over airway and  collapse extensive c/w HCAP, stat vanc, ceftaz, likely can dc aztithro, send BAL bronch that was done, obtain hiv, sedate to deep rass, may need paralysis, steroids to 80 q8h with such poor air movement, Bders, start feeds after placing OGT, ppi, ssi with steroids, add prop if BP tolerates and prn fent, maintain prn benzo with prior use, no family here The patient is critically ill with multiple organ systems failure and requires high complexity decision making for assessment and support, frequent evaluation and titration of therapies, application of advanced monitoring technologies and extensive interpretation of multiple databases.   Critical Care Time devoted to patient care services described in this note is85 Minutes. This time reflects time of care of this signee: Merrie Roof, MD FACP. This critical care time does not reflect procedure time, or teaching time or supervisory time of PA/NP/Med student/Med Resident etc but could involve care discussion time. Rest per NP/medical resident whose note is outlined above and that I agree with   Lavon Paganini. Titus Mould, MD, [place] Pgr: [facility] 05/11/2017 9:26 AM

## 2017-05-11 NOTE — Progress Notes (Signed)
Called to notify patient's wife of transfer to ICU.

## 2017-05-11 NOTE — Procedures (Signed)
Intubation Procedure Note [facility] BELLUOM[facility] [id number] 18-May-1966  Procedure: Intubation Indications: Respiratory insufficiency  Procedure Details Consent: Unable to obtain consent because of emergent medical necessity. Time Out: Verified patient identification, verified procedure, site/side was marked, verified correct patient position, special equipment/implants available, medications/allergies/relevent history reviewed, required imaging and test results available.  Performed  Maximum sterile technique was used including antiseptics, cap, gloves, gown, hand hygiene and mask.  MAC and 4    Evaluation Hemodynamic Status: BP stable throughout; O2 sats: stable throughout Patient's Current Condition: stable Complications: No apparent complications Patient did tolerate procedure well. Chest X-ray ordered to verify placement.  CXR: pending.   Raylene Miyamoto. 05/11/2017  Pus all in airway suctioned Then difficult to bag  - r/o collapse obstruction = bronch planned

## 2017-05-11 NOTE — Progress Notes (Signed)
Pharmacy Antibiotic Note  Harold Bailey is a 51 y.o. male admitted on 05/10/2017 with SOB, pt required emergent intubation in ICU this morning. Planning to escalate abx for likely pneumonia. Bronch completed which showed thick frothy pus-like secretions. SCr 0.59, CrCl > 90 ml/min.   Plan: -Vancomycin 2 g IV x1 then 1g/12h -Ceftazidime 2g/8h -Monitor renal fx, cultures, VT at Css   Height: 6' (182.9 cm) Weight: 225 lb 1.4 oz (102.1 kg) IBW/kg (Calculated) : 77.6  Temp (24hrs), Avg:97.9 F (36.6 C), Min:97.6 F (36.4 C), Max:98.3 F (36.8 C)   Recent Labs Lab 05/10/17 1647 05/10/17 1957 05/11/17 0254  WBC 9.8  --  10.1  CREATININE 0.65  --  0.59*  LATICACIDVEN  --  1.70  --     Estimated Creatinine Clearance: 133.5 mL/min (A) (by C-G formula based on SCr of 0.59 mg/dL (L)).    Allergies  Allergen Reactions  . Hydrocodone Rash  . No Known Allergies     Antimicrobials this admission: 8/19 ceftriaxone x1 8/19 azithromycin > 8/19 ceftazidime > 8/19 vancomycin >  Dose adjustments this admission: N/A  Microbiology results: 8/18 mrsa pcr: pos 8/18 BAL: sent    Harvel Quale 05/11/2017 8:58 AM

## 2017-05-11 NOTE — Progress Notes (Signed)
Chaplain reported to RN regarding consult (request for prayer), usually filled on weekdays, except by special request.  RN reported that patient was heavily sedated and tomorrow would be fine. If patient becomes more alert and a need is observed, please page the chaplain on call.  Will recommend f/u with floor chaplain.  Dorathy Daft [place], [place]

## 2017-05-11 NOTE — Progress Notes (Signed)
Pt. experiencing increased confusion and shortness of breath at shift change, MD at bedside, pt. was emergently transferred to 78M for intubation.

## 2017-05-12 ENCOUNTER — Inpatient Hospital Stay (HOSPITAL_COMMUNITY): Payer: Medicare Other

## 2017-05-12 DIAGNOSIS — I959 Hypotension, unspecified: Secondary | ICD-10-CM

## 2017-05-12 DIAGNOSIS — G9340 Encephalopathy, unspecified: Secondary | ICD-10-CM

## 2017-05-12 DIAGNOSIS — J9601 Acute respiratory failure with hypoxia: Secondary | ICD-10-CM

## 2017-05-12 LAB — CBC WITH DIFFERENTIAL/PLATELET
BASOS PCT: 0 %
Basophils Absolute: 0 10*3/uL (ref 0.0–0.1)
EOS PCT: 0 %
Eosinophils Absolute: 0 10*3/uL (ref 0.0–0.7)
HEMATOCRIT: 49.4 % (ref 39.0–52.0)
Hemoglobin: 15.2 g/dL (ref 13.0–17.0)
LYMPHS ABS: 1 10*3/uL (ref 0.7–4.0)
Lymphocytes Relative: 8 %
MCH: 32.6 pg (ref 26.0–34.0)
MCHC: 30.8 g/dL (ref 30.0–36.0)
MCV: 106 fL — AB (ref 78.0–100.0)
MONO ABS: 0.8 10*3/uL (ref 0.1–1.0)
Monocytes Relative: 6 %
Neutro Abs: 10.7 10*3/uL — ABNORMAL HIGH (ref 1.7–7.7)
Neutrophils Relative %: 86 %
Platelets: 161 10*3/uL (ref 150–400)
RBC: 4.66 MIL/uL (ref 4.22–5.81)
RDW: 14.1 % (ref 11.5–15.5)
WBC: 12.5 10*3/uL — ABNORMAL HIGH (ref 4.0–10.5)

## 2017-05-12 LAB — BLOOD GAS, ARTERIAL
Acid-Base Excess: 10.7 mmol/L — ABNORMAL HIGH (ref 0.0–2.0)
Bicarbonate: 36.8 mmol/L — ABNORMAL HIGH (ref 20.0–28.0)
DRAWN BY: 330991
FIO2: 70
MECHVT: 620 mL
O2 Saturation: 91.3 %
PEEP/CPAP: 10 cmH2O
PO2 ART: 62.3 mmHg — AB (ref 83.0–108.0)
Patient temperature: 98.2
RATE: 22 resp/min
pCO2 arterial: 70.2 mmHg (ref 32.0–48.0)
pH, Arterial: 7.338 — ABNORMAL LOW (ref 7.350–7.450)

## 2017-05-12 LAB — COMPREHENSIVE METABOLIC PANEL
ALT: 23 U/L (ref 17–63)
AST: 12 U/L — ABNORMAL LOW (ref 15–41)
Albumin: 2.8 g/dL — ABNORMAL LOW (ref 3.5–5.0)
Alkaline Phosphatase: 62 U/L (ref 38–126)
Anion gap: 4 — ABNORMAL LOW (ref 5–15)
BUN: 25 mg/dL — ABNORMAL HIGH (ref 6–20)
CO2: 37 mmol/L — ABNORMAL HIGH (ref 22–32)
Calcium: 8.8 mg/dL — ABNORMAL LOW (ref 8.9–10.3)
Chloride: 99 mmol/L — ABNORMAL LOW (ref 101–111)
Creatinine, Ser: 0.72 mg/dL (ref 0.61–1.24)
GFR calc Af Amer: >60 mL/min (ref >60)
GFR calc non Af Amer: >60 mL/min (ref >60)
Glucose, Bld: 160 mg/dL — ABNORMAL HIGH (ref 65–99)
Potassium: 4.6 mmol/L (ref 3.5–5.1)
Sodium: 140 mmol/L (ref 135–145)
Total Bilirubin: 0.5 mg/dL (ref 0.3–1.2)
Total Protein: 5.8 g/dL — ABNORMAL LOW (ref 6.5–8.1)

## 2017-05-12 LAB — GLUCOSE, CAPILLARY
GLUCOSE-CAPILLARY: 176 mg/dL — AB (ref 65–99)
GLUCOSE-CAPILLARY: 185 mg/dL — AB (ref 65–99)
GLUCOSE-CAPILLARY: 188 mg/dL — AB (ref 65–99)
Glucose-Capillary: 176 mg/dL — ABNORMAL HIGH (ref 65–99)
Glucose-Capillary: 184 mg/dL — ABNORMAL HIGH (ref 65–99)
Glucose-Capillary: 191 mg/dL — ABNORMAL HIGH (ref 65–99)
Glucose-Capillary: 254 mg/dL — ABNORMAL HIGH (ref 65–99)

## 2017-05-12 LAB — MAGNESIUM
MAGNESIUM: 2.6 mg/dL — AB (ref 1.7–2.4)
Magnesium: 2.4 mg/dL (ref 1.7–2.4)

## 2017-05-12 LAB — PHOSPHORUS
PHOSPHORUS: 2.3 mg/dL — AB (ref 2.5–4.6)
Phosphorus: 3.4 mg/dL (ref 2.5–4.6)

## 2017-05-12 MED ORDER — VITAL HIGH PROTEIN PO LIQD
1000.0000 mL | ORAL | Status: DC
  Administered 2017-05-12 – 2017-05-13 (×2): 1000 mL
  Administered 2017-05-13
  Administered 2017-05-14: 1000 mL
  Administered 2017-05-14 (×4)
  Administered 2017-05-15 – 2017-05-18 (×4): 1000 mL
  Filled 2017-05-12: qty 1000

## 2017-05-12 MED ORDER — SODIUM CHLORIDE 0.9 % IV SOLN: 0.4000 ug/kg/h | INTRAVENOUS | Status: DC

## 2017-05-12 MED ORDER — ORAL CARE MOUTH RINSE
15.0000 mL | Freq: Four times a day (QID) | OROMUCOSAL | Status: DC
  Administered 2017-05-12 – 2017-05-20 (×32): 15 mL via OROMUCOSAL

## 2017-05-12 MED ORDER — PRO-STAT SUGAR FREE PO LIQD
60.0000 mL | Freq: Four times a day (QID) | ORAL | Status: DC
  Administered 2017-05-12 – 2017-05-19 (×27): 60 mL
  Filled 2017-05-12 (×32): qty 60

## 2017-05-12 MED ORDER — CHLORHEXIDINE GLUCONATE 0.12% ORAL RINSE (MEDLINE KIT)
15.0000 mL | Freq: Two times a day (BID) | OROMUCOSAL | Status: DC
  Administered 2017-05-12 – 2017-05-20 (×16): 15 mL via OROMUCOSAL

## 2017-05-12 MED ORDER — SODIUM CHLORIDE 0.9 % IV SOLN
0.4000 ug/kg/h | INTRAVENOUS | Status: DC
  Administered 2017-05-12: 0.4 ug/kg/h via INTRAVENOUS
  Administered 2017-05-12: 0.5 ug/kg/h via INTRAVENOUS
  Filled 2017-05-12 (×2): qty 2

## 2017-05-12 MED ORDER — DEXMEDETOMIDINE HCL IN NACL 400 MCG/100ML IV SOLN
0.4000 ug/kg/h | INTRAVENOUS | Status: DC
  Administered 2017-05-12: 0.5 ug/kg/h via INTRAVENOUS
  Filled 2017-05-12: qty 100

## 2017-05-12 MED ORDER — FENTANYL CITRATE (PF) 100 MCG/2ML IJ SOLN
50.0000 ug | INTRAMUSCULAR | Status: DC | PRN
  Administered 2017-05-15 – 2017-05-16 (×2): 100 ug via INTRAVENOUS
  Administered 2017-05-19: 50 ug via INTRAVENOUS
  Filled 2017-05-12 (×4): qty 2

## 2017-05-12 NOTE — Progress Notes (Signed)
[facility] Progress Note Patient Name: Harold Bailey DOB: Oct 23, 1965 MRN: [id number]   Date of Service  05/12/2017  HPI/Events of Note  ABG on 70%/PRVC 22/TV 620/P 10 = 7.33/70/62/36.8. The patient's pH is acceptable.   eICU Interventions  Continue present management.      Intervention Category Major Interventions: Acid-Base disturbance - evaluation and management;Respiratory failure - evaluation and management  Sommer,Steven Cornelia Copa 05/12/2017, 3:56 AM

## 2017-05-12 NOTE — Progress Notes (Signed)
CRITICAL VALUE ALERT  Critical Value:  PH 7.33, CO2 70, PO2 62, and Bicarb 36.8  Date & Time Notied:  05/12/17 0354  Provider Notified: Dr. Oletta Darter  Orders Received/Actions taken: No new orders given at this time

## 2017-05-12 NOTE — Progress Notes (Signed)
152mL Fentanyl wasted in sink witnessed by Cheral Marker, RN

## 2017-05-12 NOTE — Progress Notes (Signed)
   05/12/17 0950  Clinical Encounter Type  Visited With Patient  Visit Type Other (Comment) ([place] consult)  Spiritual Encounters  Spiritual Needs Prayer  Stress Factors  Patient Stress Factors Health changes  Introduction to Pt. Offered prayer of hope and peace.

## 2017-05-12 NOTE — Progress Notes (Signed)
Initial Nutrition Assessment  DOCUMENTATION CODES:   Obesity unspecified  INTERVENTION:   Vital High Protein @ rate of 20 ml/hr (trickle feedings to maintain gut integrity) with addition of Pro-Stat 60 mL QID.  Provides 162 g of protein, 1280 kcals. Additional kcals from diprivan at present   NUTRITION DIAGNOSIS:   Inadequate oral intake related to acute illness as evidenced by NPO status.  GOAL:   Provide needs based on ASPEN/SCCM guidelines  MONITOR:   Vent status, Labs, Weight trends, TF tolerance  REASON FOR ASSESSMENT:   Consult, Ventilator Enteral/tube feeding initiation and management  ASSESSMENT:   51 yo male admitted with dyspnea with COPD exacerbation, pneumonia. Pt with hx of anxiety, COPD, seizures, substance abuse, bipolar disorder   Patient is currently intubated on ventilator support MV: 13.3 L/min Temp (24hrs), Avg:98.3 F (36.8 C), Min:97.7 F (36.5 C), Max:98.5 F (36.9 C)  Diprivan: 30.3 ml/hr (800 kcals in 24 hours), much lower dose over night  Labs: reviewed Meds: solumedrol  Diet Order:  Diet NPO time specified  Skin:  Reviewed, no issues  Last BM:  no documented BM  Height:   Ht Readings from Last 1 Encounters:  05/10/17 6' (1.829 m)    Weight:   Wt Readings from Last 1 Encounters:  05/12/17 233 lb 4 oz (105.8 kg)    Ideal Body Weight:  80.9 kg  BMI:  Body mass index is 31.63 kg/m.  Estimated Nutritional Needs:   Kcal:  6144-3154 kcals or 1559 kcals (70% [employer])  Protein:  162-178 g  Fluid:  >/= 2 L  EDUCATION NEEDS:   No education needs identified at this time  [place], [place], LDN [phone] Pager  [phone] Weekend/On-Call Pager

## 2017-05-12 NOTE — Progress Notes (Signed)
PULMONARY / CRITICAL CARE MEDICINE   Name: Harold Bailey MRN: [id number] DOB: [birth year]    ADMISSION DATE:  05/10/2017 CONSULTATION DATE:  05/11/17  REFERRING MD:  Dr. Loleta Books  CHIEF COMPLAINT:  Resp Distress   HISTORY OF PRESENT ILLNESS:   51 yo male smoker with hx of COPD/Asthma , Seizure disorder, SVT , Bipolar, substance abuse with recent admission to outlying hospital with chest pain. He had SVT, cardioverted with Adenosine . LHC showed nonobstructive CAD. He developed bronchitic symptoms during this admission and was treated with prednisone and omnicef. CXR and CT chest were reported as neg for PNA. 2 days prior to admission he developed progressively worsening sob, cough and wheezing . He was brought to ER on 8/18 and admitted by [facility] . He was started on solumedrol, nebs, and abx. LA was normal . CXR showed on pna.  Early am on 819 with progressive decline with hypoxia, increased wob and lethargy . PCCM asked to consult. Pt was moved to ICU and intubated. Bedside bronchoscopy showed very thick airway secretions. Airway .    SUBJECTIVE:  Agitated despite propofol 50, fent 400.    VITAL SIGNS: BP 126/79   Pulse (!) 119   Temp 97.7 F (36.5 C) (Axillary)   Resp (!) 23   Ht 6' (1.829 m)   Wt 105.8 kg (233 lb 4 oz)   SpO2 90%   BMI 31.63 kg/m   HEMODYNAMICS:    VENTILATOR SETTINGS: Vent Mode: PRVC FiO2 (%):  [70 %-80 %] 70 % Set Rate:  [22 bmp] 22 bmp Vt Set:  [638 mL] 620 mL PEEP:  [10 cmH20] 10 cmH20 Plateau Pressure:  [19 cmH20-28 cmH20] 28 cmH20  INTAKE / OUTPUT: I/O last 3 completed shifts: In: 2081.5 [I.V.:968.8; NG/GT:712.7; IV Piggyback:400] Out: 1610 [Urine:1610]  PHYSICAL EXAMINATION: General:  Obese male sedated on vent  Neuro: sedated on vent, rass +1, easily agitated per RN HEENT:  ETT in place  Cardiovascular:  ST , no m//r.g  Lungs: decreased aeration , +wheezing throughout ,  Poor air movement  Abdomen:  Obese , BS +  Musculoskeletal:  Intact   Skin:  No rash   LABS:  BMET  Recent Labs Lab 05/10/17 1647 05/11/17 0254 05/12/17 0455  NA 139 141 140  K 4.2 4.3 4.6  CL 96* 96* 99*  CO2 34* 36* 37*  BUN 17 16 25*  CREATININE 0.65 0.59* 0.72  GLUCOSE 151* 159* 160*    Electrolytes  Recent Labs Lab 05/10/17 1647 05/11/17 0254 05/11/17 0930 05/11/17 1655 05/12/17 0455  CALCIUM 9.6 9.1  --   --  8.8*  MG  --   --  2.1 2.2 2.4  PHOS  --   --  3.6 2.2* 2.3*    CBC  Recent Labs Lab 05/10/17 1647 05/11/17 0254 05/12/17 0455  WBC 9.8 10.1 12.5*  HGB 18.2* 16.9 15.2  HCT 54.9* 51.5 49.4  PLT 165 148* 161    Coag's No results for input(s): APTT, INR in the last 168 hours.  Sepsis Markers  Recent Labs Lab 05/10/17 1957  LATICACIDVEN 1.70    ABG  Recent Labs Lab 05/11/17 0510 05/11/17 0940 05/12/17 0345  PHART 7.299* 7.291* 7.338*  PCO2ART 85.8* 83.7* 70.2*  PO2ART 255* 87.0 62.3*    Liver Enzymes  Recent Labs Lab 05/12/17 0455  AST 12*  ALT 23  ALKPHOS 62  BILITOT 0.5  ALBUMIN 2.8*    Cardiac Enzymes No results for input(s): TROPONINI, PROBNP in the  last 168 hours.  Glucose  Recent Labs Lab 05/11/17 0850 05/11/17 1229 05/11/17 1623 05/11/17 2046 05/12/17 0011 05/12/17 0418  GLUCAP 170* 149* 172* 163* 176* 176*    Imaging Portable Chest Xray  Result Date: 05/12/2017 CLINICAL DATA:  History of COPD, shortness of breath, hypertension. EXAM: PORTABLE CHEST 1 VIEW COMPARISON:  None. FINDINGS: Endotracheal tube and NG tube in unchanged position. Heart size normal. Stable mild basilar atelectasis. Interim partial clearing of basilar atelectasis/infiltrates and small pleural effusions. No pneumothorax . IMPRESSION: 1. Lines and tubes in stable position. 2. Interim partial clearing of basilar atelectasis/ infiltrates and small pleural effusions . Electronically Signed   By: Marcello Moores  Register   On: 05/12/2017 06:44     STUDIES:  Bronch 8/19- tracheal and left main and RUL  collapse thick pus  CULTURES: 8/19 Sputum cx Bronch bal >>few GNR>>> 8/19 bc>>>  ANTIBIOTICS: 8/19 Vanc >> 8/19 Fortax  >>  SIGNIFICANT EVENTS: 8/19 ETT emergent, bronch with collapse  LINES/TUBES: 8/19 ETT >>  DISCUSSION: 51 yo male smoker with Bipolar/substance abuse admitted in COPD exacerbation with progressive decline with hypercarbic respiratory failure requiring intubation /vent support on 8/19 .  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/2017  SVT episode s/p adenosine 1 week prior to admit  HTN  P:  Hold home meds (Lisinopril/HCTZ  )  Monitor   RENAL hypophos  P:   Replace electrolytes as indicated  F/u chem   GASTROINTESTINAL A:   NPO  P:   PPI  TF  HEMATOLOGIC A:  Secondary polycythemia dvt prevention  P:  Lovenox SQ  Allow pos balance  INFECTIOUS A:   COPD exacerbation \hcap P:   Follow culture- BAL bronch  VDeniece Ree/Fortaz  Send BC D/c azithro HIV pending  ENDOCRINE A:    Hyperglycemia  P:   Add SSI as indicated   NEUROLOGIC A:   Seizure disorder, vent dyscherony P:   RASS goal: -1  Cont seizure regimen  Continue propofol, fentanyl - discussed with RN ensure PIV working   PRN versed - home benzo  FAMILY  - Updates: no family available 8/20  - Inter-disciplinary family meet or Palliative Care meeting due by:  05/18/17    Harold Madrid NP 05/12/2017  11:05 AM Pager: [phone] Attending Note:  51year old male with male with COPD history presenting with a COPD exacerbation.  Patient was intubated yesterday and remains on full vent support.  Neo for BP support.  Add precedex since on high  dose fentanyl and propofol.  Continue full vent support.  Hold off weaning and extubation for today.  KVO IVF.  Continue TF.  Keep even.  Will attempt weaning again in AM.  I reviewed CXR myself, ETT in good position and hyperinflation noted.    The patient is critically ill with multiple organ systems failure and requires high complexity decision making for assessment and support, frequent evaluation and titration of therapies, application of advanced monitoring technologies and extensive interpretation of multiple databases.   Critical Care Time devoted to patient care services described in this note is  35  Minutes.  This time reflects time of care of this signee Dr Jennet Maduro. This critical care time does not reflect procedure time, or teaching time or supervisory time of PA/NP/Med student/Med Resident etc but could involve care discussion time.  Rush Farmer, M.D. [facility] Medicine. Pager: [phone]. After hours pager: [phone].

## 2017-05-12 NOTE — Progress Notes (Signed)
Critical ABG result called to RN. RT will continue to monitor.

## 2017-05-13 ENCOUNTER — Inpatient Hospital Stay (HOSPITAL_COMMUNITY): Payer: Medicare Other

## 2017-05-13 DIAGNOSIS — J810 Acute pulmonary edema: Secondary | ICD-10-CM

## 2017-05-13 DIAGNOSIS — I2699 Other pulmonary embolism without acute cor pulmonale: Secondary | ICD-10-CM

## 2017-05-13 LAB — CBC
HCT: 47 % (ref 39.0–52.0)
HEMOGLOBIN: 14.7 g/dL (ref 13.0–17.0)
MCH: 32.7 pg (ref 26.0–34.0)
MCHC: 31.3 g/dL (ref 30.0–36.0)
MCV: 104.7 fL — ABNORMAL HIGH (ref 78.0–100.0)
PLATELETS: 143 10*3/uL — AB (ref 150–400)
RBC: 4.49 MIL/uL (ref 4.22–5.81)
RDW: 13.5 % (ref 11.5–15.5)
WBC: 13.6 10*3/uL — ABNORMAL HIGH (ref 4.0–10.5)

## 2017-05-13 LAB — BLOOD GAS, ARTERIAL
Acid-Base Excess: 11.5 mmol/L — ABNORMAL HIGH (ref 0.0–2.0)
BICARBONATE: 37.4 mmol/L — AB (ref 20.0–28.0)
Drawn by: 252031
FIO2: 90
LHR: 22 {breaths}/min
O2 SAT: 97.5 %
PATIENT TEMPERATURE: 98.6
PCO2 ART: 69 mmHg — AB (ref 32.0–48.0)
PEEP: 10 cmH2O
PH ART: 7.353 (ref 7.350–7.450)
PO2 ART: 101 mmHg (ref 83.0–108.0)
VT: 620 mL

## 2017-05-13 LAB — BASIC METABOLIC PANEL
Anion gap: 7 (ref 5–15)
BUN: 36 mg/dL — ABNORMAL HIGH (ref 6–20)
CALCIUM: 9.1 mg/dL (ref 8.9–10.3)
CHLORIDE: 97 mmol/L — AB (ref 101–111)
CO2: 36 mmol/L — ABNORMAL HIGH (ref 22–32)
Creatinine, Ser: 0.78 mg/dL (ref 0.61–1.24)
GFR calc non Af Amer: >60 mL/min (ref >60)
Glucose, Bld: 228 mg/dL — ABNORMAL HIGH (ref 65–99)
POTASSIUM: 4.6 mmol/L (ref 3.5–5.1)
SODIUM: 140 mmol/L (ref 135–145)

## 2017-05-13 LAB — PHOSPHORUS: Phosphorus: 3.5 mg/dL (ref 2.5–4.6)

## 2017-05-13 LAB — GLUCOSE, CAPILLARY
Glucose-Capillary: 210 mg/dL — ABNORMAL HIGH (ref 65–99)
Glucose-Capillary: 185 mg/dL — ABNORMAL HIGH (ref 65–99)
Glucose-Capillary: 181 mg/dL — ABNORMAL HIGH (ref 65–99)
Glucose-Capillary: 166 mg/dL — ABNORMAL HIGH (ref 65–99)
Glucose-Capillary: 168 mg/dL — ABNORMAL HIGH (ref 65–99)

## 2017-05-13 LAB — MAGNESIUM: Magnesium: 2.5 mg/dL — ABNORMAL HIGH (ref 1.7–2.4)

## 2017-05-13 LAB — VANCOMYCIN, TROUGH: Vancomycin Tr: 7 ug/mL — ABNORMAL LOW (ref 15–20)

## 2017-05-13 LAB — PROCALCITONIN: Procalcitonin: <0.1 ng/mL

## 2017-05-13 MED ORDER — IOPAMIDOL (ISOVUE-370) INJECTION 76%
INTRAVENOUS | Status: AC
  Administered 2017-05-13: 100 mL via INTRAVENOUS
  Filled 2017-05-13: qty 100

## 2017-05-13 MED ORDER — INSULIN ASPART 100 UNIT/ML ~~LOC~~ SOLN
0.0000 [IU] | SUBCUTANEOUS | Status: DC
  Administered 2017-05-13 (×4): 2 [IU] via SUBCUTANEOUS
  Administered 2017-05-14: 3 [IU] via SUBCUTANEOUS
  Administered 2017-05-14 (×4): 2 [IU] via SUBCUTANEOUS
  Administered 2017-05-14: 3 [IU] via SUBCUTANEOUS
  Administered 2017-05-15: 2 [IU] via SUBCUTANEOUS
  Administered 2017-05-15 (×2): 3 [IU] via SUBCUTANEOUS

## 2017-05-13 MED ORDER — HEPARIN (PORCINE) IN NACL 100-0.45 UNIT/ML-% IJ SOLN
1700.0000 [IU]/h | INTRAMUSCULAR | Status: DC
  Administered 2017-05-13: 1700 [IU]/h via INTRAVENOUS
  Filled 2017-05-13: qty 250

## 2017-05-13 MED ORDER — HEPARIN BOLUS VIA INFUSION
6000.0000 [IU] | Freq: Once | INTRAVENOUS | Status: AC
  Administered 2017-05-13: 6000 [IU] via INTRAVENOUS
  Filled 2017-05-13: qty 6000

## 2017-05-13 MED ORDER — SODIUM CHLORIDE 0.9 % IV SOLN
1500.0000 mg | Freq: Two times a day (BID) | INTRAVENOUS | Status: DC
  Administered 2017-05-13 – 2017-05-21 (×16): 1500 mg via INTRAVENOUS
  Filled 2017-05-13 (×18): qty 1500

## 2017-05-13 MED ORDER — FUROSEMIDE 10 MG/ML IJ SOLN
40.0000 mg | Freq: Four times a day (QID) | INTRAMUSCULAR | Status: AC
  Administered 2017-05-13 (×3): 40 mg via INTRAVENOUS
  Filled 2017-05-13 (×3): qty 4

## 2017-05-13 MED ORDER — VANCOMYCIN HCL 10 G IV SOLR
1500.0000 mg | Freq: Two times a day (BID) | INTRAVENOUS | Status: DC
  Filled 2017-05-13: qty 1500

## 2017-05-13 MED ORDER — INSULIN ASPART 100 UNIT/ML ~~LOC~~ SOLN
4.0000 [IU] | Freq: Four times a day (QID) | SUBCUTANEOUS | Status: DC
  Administered 2017-05-13 – 2017-05-15 (×9): 4 [IU] via SUBCUTANEOUS

## 2017-05-13 MED ORDER — INSULIN ASPART 100 UNIT/ML ~~LOC~~ SOLN: 4.0000 [IU] | SUBCUTANEOUS | Status: DC

## 2017-05-13 NOTE — Progress Notes (Signed)
PULMONARY / CRITICAL CARE MEDICINE   Name: Harold Bailey MRN: [id number] DOB: 09-12-1966    ADMISSION DATE:  05/10/2017 CONSULTATION DATE:  05/11/17  REFERRING MD:  Dr. Loleta Books  CHIEF COMPLAINT:  Resp Distress   HISTORY OF PRESENT ILLNESS:   51 yo male smoker with hx of COPD/Asthma , Seizure disorder, SVT , Bipolar, substance abuse with recent admission to outlying hospital with chest pain. He had SVT, cardioverted with Adenosine . LHC showed nonobstructive CAD. He developed bronchitic symptoms during this admission and was treated with prednisone and omnicef. CXR and CT chest were reported as neg for PNA. 2 days prior to admission he developed progressively worsening sob, cough and wheezing . He was brought to ER on 8/18 and admitted by [facility] . He was started on solumedrol, nebs, and abx. LA was normal . CXR showed on pna.  Early am on 819 with progressive decline with hypoxia, increased wob and lethargy . PCCM asked to consult. Pt was moved to ICU and intubated. Bedside bronchoscopy showed very thick airway secretions. Airway .    SUBJECTIVE:  Minimal agitation on propfol and fentanyl drips, no events overnight.  VITAL SIGNS: BP 107/66   Pulse 66   Temp 98.3 F (36.8 C) (Oral)   Resp (!) 22   Ht 6' (1.829 m)   Wt 105.1 kg (231 lb 11.3 oz)   SpO2 99%   BMI 31.42 kg/m   HEMODYNAMICS:    VENTILATOR SETTINGS: Vent Mode: PRVC FiO2 (%):  [80 %-100 %] 90 % Set Rate:  [22 bmp] 22 bmp Vt Set:  [619 mL] 620 mL PEEP:  [10 cmH20] 10 cmH20 Plateau Pressure:  [20 cmH20-28 cmH20] 20 cmH20  INTAKE / OUTPUT: I/O last 3 completed shifts: In: 3560.8 [I.V.:1595.8; NG/GT:1065; IV JKDTOIZTI:458] Out: 1625 [Urine:1625]  PHYSICAL EXAMINATION: General:  Obese male sedated on vent, NAD Neuro: Sedated, RASS of 0 HEENT:  ETT in place, [place]/AT, PERRL Cardiovascular:  RRR, Nl S1/S2, -M/R/G Lungs: decreased aeration , +wheezing throughout ,  Poor air movement  Abdomen:  Obese, NT, ND and BS +   Musculoskeletal:  -edema and -tenderness Skin:  No rash, intact  LABS:  BMET  Recent Labs Lab 05/11/17 0254 05/12/17 0455 05/13/17 0318  NA 141 140 140  K 4.3 4.6 4.6  CL 96* 99* 97*  CO2 36* 37* 36*  BUN 16 25* 36*  CREATININE 0.59* 0.72 0.78  GLUCOSE 159* 160* 228*   Electrolytes  Recent Labs Lab 05/11/17 0254  05/12/17 0455 05/12/17 1654 05/13/17 0318  CALCIUM 9.1  --  8.8*  --  9.1  MG  --   < > 2.4 2.6* 2.5*  PHOS  --   < > 2.3* 3.4 3.5  < > = values in this interval not displayed.  CBC  Recent Labs Lab 05/11/17 0254 05/12/17 0455 05/13/17 0318  WBC 10.1 12.5* 13.6*  HGB 16.9 15.2 14.7  HCT 51.5 49.4 47.0  PLT 148* 161 143*   Coag's No results for input(s): APTT, INR in the last 168 hours.  Sepsis Markers  Recent Labs Lab 05/10/17 1957  LATICACIDVEN 1.70   ABG  Recent Labs Lab 05/11/17 0940 05/12/17 0345 05/13/17 0310  PHART 7.291* 7.338* 7.353  PCO2ART 83.7* 70.2* 69.0*  PO2ART 87.0 62.3* 101   Liver Enzymes  Recent Labs Lab 05/12/17 0455  AST 12*  ALT 23  ALKPHOS 62  BILITOT 0.5  ALBUMIN 2.8*    Cardiac Enzymes No results for input(s): TROPONINI, PROBNP  in the last 168 hours.  Glucose  Recent Labs Lab 05/12/17 1153 05/12/17 1535 05/12/17 2012 05/12/17 2356 05/13/17 0323 05/13/17 0748  GLUCAP 188* 191* 185* 254* 210* 185*    Imaging Dg Chest Port 1 View  Result Date: 05/13/2017 CLINICAL DATA:  Intubation. EXAM: PORTABLE CHEST 1 VIEW COMPARISON:  05/12/2017.  05/10/2017.  07/02/2016. FINDINGS: Endotracheal tube, NG tube in stable position. Heart size normal. Persistent Check mild basilar subsegmental atelectasis/infiltrates. Chronic interstitial prominence noted. No prominent pleural effusion, very tiny pleural effusions cannot be excluded. No pneumothorax. IMPRESSION: 1.  Lines and tubes in stable position. 2. Persistent unchanged mild bibasilar subsegmental atelectasis/infiltrates. No prominent pleural effusion.  Very tiny pleural effusions cannot be excluded. Electronically Signed   By: Marcello Moores  Register   On: 05/13/2017 06:45   STUDIES:  Bronch 8/19- tracheal and left main and RUL collapse thick pus  CULTURES: 8/19 Sputum cx Bronch bal >>few GNR>>> 8/19 bc>>>  ANTIBIOTICS: 8/19 Vanc >> 8/19 Fortax  >>  SIGNIFICANT EVENTS: 8/19 ETT emergent, bronch with collapse  LINES/TUBES: 8/19 ETT >> PIV  DISCUSSION: 51 yo male smoker with Bipolar/substance abuse admitted in COPD exacerbation with progressive decline with hypercarbic respiratory failure requiring intubation /vent support on 8/19 .  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/2017  SVT episode s/p adenosine 1 week prior to admit  HTN  P:  Hold home meds (Lisinopril/HCTZ  )  Monitor   RENAL hypophos  P:   Replace electrolytes as indicated  F/u chem  KVO IVF Lasix 40 mg IV q6 x3 doses  GASTROINTESTINAL A:   NPO  P:   PPI  TF  HEMATOLOGIC A:  Secondary polycythemia dvt prevention  P:  Lovenox SQ  Allow pos balance  INFECTIOUS A:   COPD exacerbation \hcap P:   Follow culture- BAL bronch  Vanc /Fortaz  Send BC D/ced azithro HIV negative  ENDOCRINE A:    Hyperglycemia  P:   Add SSI as indicated   NEUROLOGIC A:   Seizure disorder, vent dyscherony P:   RASS goal: -1  Cont seizure regimen  Continue propofol, fentanyl PRN versed - home benzo  FAMILY  - Updates: no family available 8/20  - Inter-disciplinary family meet or Palliative Care meeting due by:  05/18/17   The patient is critically ill with multiple organ systems failure and requires  high complexity decision making for assessment and support, frequent evaluation and titration of therapies, application of advanced monitoring technologies and extensive interpretation of multiple databases.   Critical Care Time devoted to patient care services described in this note is  35  Minutes. This time reflects time of care of this signee Dr WJennet Maduro This critical care time does not reflect procedure time, or teaching time or supervisory time of PA/NP/Med student/Med Resident etc but could involve care discussion time.  WRush Farmer M.D. [facility] HospitalPulmonary/Critical Care Medicine. Pager: [phone] After hours pager: [phone]

## 2017-05-13 NOTE — Progress Notes (Signed)
Pharmacy Antibiotic Note  Harold Bailey is a 51 y.o. male admitted on 05/10/2017 with shortness of breath and pneumonia. The patient has been on vancomycin since 05/11/2017. A vancomycin trough was drawn today and was subtherapeutic at 7. The trough was drawn approximately 1 hour late and has been taking into account for the dosing. The WBC is continuing to trend up. A procalcitonin has been ordered for tomorrow. Cultures are currently pending. The urine output is minimal at this time, reported at 0.33m/kg/hour. Vancomycin and ceftazidime will be continued as planned below.  Plan: -Change to Vancomycin 15061mIV q12 hours  -target trough: 15-20  -Continue Ceftazidime 2g IV q8 hours  -Monitor renal fx, cultures, vancomycin levels as indicated    Height: 6' (182.9 cm) Weight: 231 lb 11.3 oz (105.1 kg) IBW/kg (Calculated) : 77.6  Temp (24hrs), Avg:98.4 F (36.9 C), Min:97.2 F (36.2 C), Max:99.1 F (37.3 C)   Recent Labs Lab 05/10/17 1647 05/10/17 1957 05/11/17 0254 05/12/17 0455 05/13/17 0318 05/13/17 1137  WBC 9.8  --  10.1 12.5* 13.6*  --   CREATININE 0.65  --  0.59* 0.72 0.78  --   LATICACIDVEN  --  1.70  --   --   --   --   VANCOTROUGH  --   --   --   --   --  7*    Estimated Creatinine Clearance: 135.4 mL/min (by C-G formula based on SCr of 0.78 mg/dL).    Allergies  Allergen Reactions  . Hydrocodone Rash  . No Known Allergies     Antimicrobials this admission: 8/19 ceftriaxone x1 8/19 azithromycin >>8/20 8/19 ceftazidime >> 8/19 vancomycin >>    Microbiology results: 8/19 mrsa pcr: positive  8/19 BAL: pending   ShJalene MulletPharm.D. PGY1 Pharmacy Resident 05/13/2017 2:42 PM Main Pharmacy: [phone]

## 2017-05-13 NOTE — Progress Notes (Signed)
Pt. Transported to to CT on vent without incident.

## 2017-05-13 NOTE — Progress Notes (Signed)
ANTICOAGULATION CONSULT NOTE - Initial Consult  Pharmacy Consult for heparin  Indication: pulmonary embolus  Allergies  Allergen Reactions  . Hydrocodone Rash  . No Known Allergies     Patient Measurements: Height: 6' (182.9 cm) Weight: 231 lb 11.3 oz (105.1 kg) IBW/kg (Calculated) : 77.6 Heparin Dosing Weight: 98.5  Vital Signs: Temp: 98.3 F (36.8 C) (08/21 0700) Temp Source: Oral (08/21 0700) BP: 107/66 (08/21 0900) Pulse Rate: 66 (08/21 0900)  Labs:  Recent Labs  05/11/17 0254 05/12/17 0455 05/13/17 0318  HGB 16.9 15.2 14.7  HCT 51.5 49.4 47.0  PLT 148* 161 143*  CREATININE 0.59* 0.72 0.78    Estimated Creatinine Clearance: 135.4 mL/min (by C-G formula based on SCr of 0.78 mg/dL).   Medical History: Past Medical History:  Diagnosis Date  . Anxiety   . Arthritis    Left ankle  . COPD (chronic obstructive pulmonary disease) ([place])   . Drug abuse   . Gait disturbance   . GERD (gastroesophageal reflux disease)   . 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  Marland Kitchen Hyperlipidemia   . Hypertension   . 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 Memory loss   . Peripheral vision loss    Left due to brain surgery  . Pneumonia   . Polycythemia   . Prediabetes   . Seizures ([place]) 11/19/2013   none since 2016  . Shortness of breath    Hx of smoking.    Assessment: 51 y.o. male with possible pulmonary embolism. The patient was previously on lovenox for DVT prophylaxis, which has been discontinued. Hemoglobin and platelets are stable and within normal limits. Patient is being work-up for PE at this time and if VQ scan is negative pharmacy has been consulted to discontinue heparin and restart lovenox.     Goal of Therapy:  Heparin level 0.3-0.7 units/ml Monitor platelets by anticoagulation protocol: Yes   Plan:  Give 6000 units bolus x 1 Start heparin infusion at 1700 units/hr Check anti-Xa  level in 6 hours and daily while on heparin Continue to monitor H&H and platelets  [employer] 05/13/2017,11:27 AM

## 2017-05-14 ENCOUNTER — Inpatient Hospital Stay (HOSPITAL_COMMUNITY): Payer: Medicare Other

## 2017-05-14 DIAGNOSIS — J189 Pneumonia, unspecified organism: Secondary | ICD-10-CM

## 2017-05-14 LAB — GLUCOSE, CAPILLARY
GLUCOSE-CAPILLARY: 168 mg/dL — AB (ref 65–99)
GLUCOSE-CAPILLARY: 213 mg/dL — AB (ref 65–99)
Glucose-Capillary: 160 mg/dL — ABNORMAL HIGH (ref 65–99)
Glucose-Capillary: 163 mg/dL — ABNORMAL HIGH (ref 65–99)
Glucose-Capillary: 166 mg/dL — ABNORMAL HIGH (ref 65–99)
Glucose-Capillary: 241 mg/dL — ABNORMAL HIGH (ref 65–99)

## 2017-05-14 LAB — BLOOD GAS, ARTERIAL
Acid-Base Excess: 19.4 mmol/L — ABNORMAL HIGH (ref 0.0–2.0)
Bicarbonate: 45.2 mmol/L — ABNORMAL HIGH (ref 20.0–28.0)
DRAWN BY: 252031
FIO2: 80
MECHVT: 620 mL
O2 Saturation: 94.4 %
PATIENT TEMPERATURE: 98.6
PCO2 ART: 70.8 mmHg — AB (ref 32.0–48.0)
PEEP: 10 cmH2O
RATE: 22 resp/min
pH, Arterial: 7.421 (ref 7.350–7.450)
pO2, Arterial: 71.7 mmHg — ABNORMAL LOW (ref 83.0–108.0)

## 2017-05-14 LAB — PHOSPHORUS: Phosphorus: 3.3 mg/dL (ref 2.5–4.6)

## 2017-05-14 LAB — CBC
HCT: 47.4 % (ref 39.0–52.0)
Hemoglobin: 14.5 g/dL (ref 13.0–17.0)
MCH: 32.4 pg (ref 26.0–34.0)
MCHC: 30.6 g/dL (ref 30.0–36.0)
MCV: 105.8 fL — AB (ref 78.0–100.0)
PLATELETS: 149 10*3/uL — AB (ref 150–400)
RBC: 4.48 MIL/uL (ref 4.22–5.81)
RDW: 13.4 % (ref 11.5–15.5)
WBC: 11.8 10*3/uL — AB (ref 4.0–10.5)

## 2017-05-14 LAB — BASIC METABOLIC PANEL
Anion gap: 9 (ref 5–15)
Anion gap: 8 (ref 5–15)
BUN: 35 mg/dL — AB (ref 6–20)
BUN: 37 mg/dL — AB (ref 6–20)
CALCIUM: 9 mg/dL (ref 8.9–10.3)
CHLORIDE: 92 mmol/L — AB (ref 101–111)
CO2: 42 mmol/L — ABNORMAL HIGH (ref 22–32)
CO2: 43 mmol/L — AB (ref 22–32)
CREATININE: 0.75 mg/dL (ref 0.61–1.24)
Calcium: 9.1 mg/dL (ref 8.9–10.3)
Chloride: 94 mmol/L — ABNORMAL LOW (ref 101–111)
Creatinine, Ser: 0.72 mg/dL (ref 0.61–1.24)
GFR calc Af Amer: >60 mL/min (ref >60)
GFR calc Af Amer: >60 mL/min (ref >60)
GFR calc non Af Amer: >60 mL/min (ref >60)
GLUCOSE: 155 mg/dL — AB (ref 65–99)
GLUCOSE: 207 mg/dL — AB (ref 65–99)
Potassium: 3.9 mmol/L (ref 3.5–5.1)
Potassium: 3.5 mmol/L (ref 3.5–5.1)
SODIUM: 145 mmol/L (ref 135–145)
SODIUM: 143 mmol/L (ref 135–145)

## 2017-05-14 LAB — MAGNESIUM: Magnesium: 2.3 mg/dL (ref 1.7–2.4)

## 2017-05-14 LAB — PROCALCITONIN: Procalcitonin: <0.1 ng/mL

## 2017-05-14 LAB — TRIGLYCERIDES: Triglycerides: 328 mg/dL — ABNORMAL HIGH (ref <150)

## 2017-05-14 MED ORDER — DOCUSATE SODIUM 50 MG/5ML PO LIQD
100.0000 mg | Freq: Every day | ORAL | Status: DC
  Administered 2017-05-14 – 2017-05-16 (×3): 100 mg
  Filled 2017-05-14 (×3): qty 10

## 2017-05-14 MED ORDER — MAGNESIUM CITRATE PO SOLN
1.0000 | Freq: Once | ORAL | Status: AC
  Administered 2017-05-14: 1 via ORAL
  Filled 2017-05-14: qty 296

## 2017-05-14 MED ORDER — CHLORHEXIDINE GLUCONATE CLOTH 2 % EX PADS
6.0000 | MEDICATED_PAD | Freq: Every day | CUTANEOUS | Status: DC
  Administered 2017-05-14 – 2017-05-27 (×14): 6 via TOPICAL

## 2017-05-14 MED ORDER — SODIUM CHLORIDE 0.9% FLUSH: 10.0000 mL | INTRAVENOUS | Status: DC | PRN

## 2017-05-14 MED ORDER — ENOXAPARIN SODIUM 40 MG/0.4ML ~~LOC~~ SOLN
40.0000 mg | SUBCUTANEOUS | Status: DC
  Administered 2017-05-14 – 2017-05-22 (×9): 40 mg via SUBCUTANEOUS
  Filled 2017-05-14 (×10): qty 0.4

## 2017-05-14 MED ORDER — FUROSEMIDE 10 MG/ML IJ SOLN
8.0000 mg/h | INTRAVENOUS | Status: AC
  Administered 2017-05-14: 8 mg/h via INTRAVENOUS
  Filled 2017-05-14 (×2): qty 25

## 2017-05-14 MED ORDER — FREE WATER
250.0000 mL | Freq: Four times a day (QID) | Status: DC
  Administered 2017-05-14 – 2017-05-20 (×24): 250 mL

## 2017-05-14 MED ORDER — ACETAZOLAMIDE SODIUM 500 MG IJ SOLR
250.0000 mg | Freq: Four times a day (QID) | INTRAMUSCULAR | Status: AC
  Administered 2017-05-14 – 2017-05-15 (×3): 250 mg via INTRAVENOUS
  Filled 2017-05-14 (×3): qty 250

## 2017-05-14 MED ORDER — DEXMEDETOMIDINE HCL IN NACL 400 MCG/100ML IV SOLN
0.4000 ug/kg/h | INTRAVENOUS | Status: DC
  Administered 2017-05-15 (×3): 1.2 ug/kg/h via INTRAVENOUS
  Administered 2017-05-15: 0.5 ug/kg/h via INTRAVENOUS
  Administered 2017-05-16 (×5): 1.2 ug/kg/h via INTRAVENOUS
  Administered 2017-05-16: 0.7 ug/kg/h via INTRAVENOUS
  Administered 2017-05-16: 0.8 ug/kg/h via INTRAVENOUS
  Administered 2017-05-17 (×2): 1 ug/kg/h via INTRAVENOUS
  Administered 2017-05-17: 0.9 ug/kg/h via INTRAVENOUS
  Administered 2017-05-17 – 2017-05-18 (×3): 1 ug/kg/h via INTRAVENOUS
  Administered 2017-05-18 (×4): 1.2 ug/kg/h via INTRAVENOUS
  Administered 2017-05-18 (×2): 1 ug/kg/h via INTRAVENOUS
  Administered 2017-05-19 – 2017-05-20 (×8): 1.2 ug/kg/h via INTRAVENOUS
  Administered 2017-05-20: 1 ug/kg/h via INTRAVENOUS
  Administered 2017-05-20 (×3): 1.2 ug/kg/h via INTRAVENOUS
  Administered 2017-05-20: 0.6 ug/kg/h via INTRAVENOUS
  Administered 2017-05-20 – 2017-05-21 (×3): 1.2 ug/kg/h via INTRAVENOUS
  Filled 2017-05-14 (×43): qty 100

## 2017-05-14 MED ORDER — METOLAZONE 5 MG PO TABS
5.0000 mg | ORAL_TABLET | Freq: Every day | ORAL | Status: AC
  Administered 2017-05-14: 5 mg via ORAL
  Filled 2017-05-14 (×2): qty 1

## 2017-05-14 MED ORDER — SODIUM CHLORIDE 0.9 % IV SOLN
0.4000 ug/kg/h | INTRAVENOUS | Status: DC
  Administered 2017-05-14: 0.5 ug/kg/h via INTRAVENOUS
  Administered 2017-05-14: 0.4 ug/kg/h via INTRAVENOUS
  Filled 2017-05-14 (×4): qty 2

## 2017-05-14 MED ORDER — SODIUM CHLORIDE 0.9% FLUSH
10.0000 mL | Freq: Two times a day (BID) | INTRAVENOUS | Status: DC
  Administered 2017-05-16 – 2017-05-26 (×13): 10 mL

## 2017-05-14 MED ORDER — MIDAZOLAM HCL 2 MG/2ML IJ SOLN
1.0000 mg | INTRAMUSCULAR | Status: DC | PRN
  Administered 2017-05-19 (×2): 2 mg via INTRAVENOUS
  Filled 2017-05-14 (×4): qty 2

## 2017-05-14 MED ORDER — POTASSIUM CHLORIDE 20 MEQ/15ML (10%) PO SOLN
40.0000 meq | Freq: Three times a day (TID) | ORAL | Status: AC
  Administered 2017-05-14 (×2): 40 meq
  Filled 2017-05-14 (×2): qty 30

## 2017-05-14 NOTE — Progress Notes (Signed)
eLink Physician-Brief Progress Note Patient Name: Harold Bailey DOB: [birth year] MRN: [id number]   Date of Service  05/14/2017  HPI/Events of Note  ABG on 80%/PRVC 22/TV 620/P 10 = 7.42/70.8/71.7/45.2  eICU Interventions  Continue present management.      Intervention Category Major Interventions: Respiratory failure - evaluation and management  Quantarius Genrich Eugene 05/14/2017, 5:27 AM

## 2017-05-14 NOTE — Progress Notes (Signed)
Peripherally Inserted Central Catheter/Midline Placement  The IV Nurse has discussed with the patient and/or persons authorized to consent for the patient, the purpose of this procedure and the potential benefits and risks involved with this procedure.  The benefits include less needle sticks, lab draws from the catheter, and the patient may be discharged home with the catheter. Risks include, but not limited to, infection, bleeding, blood clot (thrombus formation), and puncture of an artery; nerve damage and irregular heartbeat and possibility to perform a PICC exchange if needed/ordered by physician.  Alternatives to this procedure were also discussed.  [employer] PICC patient education guide, fact sheet on infection prevention and patient information card has been provided to patient /or left at bedside.    PICC/Midline Placement Documentation    Consent signed by Wife at bedside    Synthia Innocent 05/14/2017, 4:21 PM

## 2017-05-14 NOTE — Progress Notes (Signed)
PULMONARY / CRITICAL CARE MEDICINE   Name: Harold Bailey MRN: [id number] DOB: 09/23/1966    ADMISSION DATE:  05/10/2017 CONSULTATION DATE:  05/11/17  REFERRING MD:  Dr. Loleta Books  CHIEF COMPLAINT:  Resp Distress   HISTORY OF PRESENT ILLNESS:   51 yo male smoker with hx of COPD/Asthma , Seizure disorder, SVT , Bipolar, substance abuse with recent admission to outlying hospital with chest pain. He had SVT, cardioverted with Adenosine . LHC showed nonobstructive CAD. He developed bronchitic symptoms during this admission and was treated with prednisone and omnicef. CXR and CT chest were reported as neg for PNA. 2 days prior to admission he developed progressively worsening sob, cough and wheezing . He was brought to ER on 8/18 and admitted by [facility] . He was started on solumedrol, nebs, and abx. LA was normal . CXR showed on pna.  Early am on 819 with progressive decline with hypoxia, increased wob and lethargy . PCCM asked to consult. Pt was moved to ICU and intubated. Bedside bronchoscopy showed very thick airway secretions. Airway .    SUBJECTIVE:  Minimal agitation on propfol and fentanyl drips, no events overnight.  VITAL SIGNS: BP 134/76   Pulse (!) 59   Temp 98.4 F (36.9 C) (Oral)   Resp 20   Ht 6' (1.829 m)   Wt 106.3 kg (234 lb 5.6 oz)   SpO2 93%   BMI 31.78 kg/m   HEMODYNAMICS:    VENTILATOR SETTINGS: Vent Mode: PRVC FiO2 (%):  [70 %-80 %] 80 % Set Rate:  [20 bmp-22 bmp] 22 bmp Vt Set:  [956 mL] 620 mL PEEP:  [10 cmH20] 10 cmH20 Plateau Pressure:  [17 cmH20-20 cmH20] 17 cmH20  INTAKE / OUTPUT: I/O last 3 completed shifts: In: 2984.1 [I.V.:571.1; NG/GT:813; IV Piggyback:1600] Out: 5250 [Urine:5250]  PHYSICAL EXAMINATION: General:  Obese male sedated on vent, NAD. Neuro: Sedated, RASS of 0. HEENT:  ETT in place, [place]/AT, PERRL. Cardiovascular:  RRR, Nl S1/S2, -M/R/G. Lungs: decreased aeration , +wheezing throughout ,  Poor air movement  Abdomen:  Obese, NT, ND  and BS+. Musculoskeletal:  -edema and -tenderness Skin:  No rash, intact  LABS:  BMET  Recent Labs Lab 05/12/17 0455 05/13/17 0318 05/14/17 0243  NA 140 140 145  K 4.6 4.6 3.9  CL 99* 97* 94*  CO2 37* 36* 42*  BUN 25* 36* 35*  CREATININE 0.72 0.78 0.72  GLUCOSE 160* 228* 155*   Electrolytes  Recent Labs Lab 05/12/17 0455 05/12/17 1654 05/13/17 0318 05/14/17 0243  CALCIUM 8.8*  --  9.1 9.0  MG 2.4 2.6* 2.5* 2.3  PHOS 2.3* 3.4 3.5 3.3    CBC  Recent Labs Lab 05/12/17 0455 05/13/17 0318 05/14/17 0243  WBC 12.5* 13.6* 11.8*  HGB 15.2 14.7 14.5  HCT 49.4 47.0 47.4  PLT 161 143* 149*   Coag's No results for input(s): APTT, INR in the last 168 hours.  Sepsis Markers  Recent Labs Lab 05/10/17 1957 05/13/17 1137 05/14/17 0243  LATICACIDVEN 1.70  --   --   PROCALCITON  --  <0.10 <0.10   ABG  Recent Labs Lab 05/12/17 0345 05/13/17 0310 05/14/17 0445  PHART 7.338* 7.353 7.421  PCO2ART 70.2* 69.0* 70.8*  PO2ART 62.3* 101 71.7*   Liver Enzymes  Recent Labs Lab 05/12/17 0455  AST 12*  ALT 23  ALKPHOS 62  BILITOT 0.5  ALBUMIN 2.8*   Cardiac Enzymes No results for input(s): TROPONINI, PROBNP in the last 168 hours.  Glucose  Recent Labs Lab 05/13/17 1130 05/13/17 1523 05/13/17 2017 05/13/17 2346 05/14/17 0823 05/14/17 1132  GLUCAP 181* 166* 168* 163* 160* 168*    Imaging Ct Angio Chest Pe W Or Wo Contrast  Result Date: 05/13/2017 CLINICAL DATA:  Shortness of breath. EXAM: CT ANGIOGRAPHY CHEST WITH CONTRAST TECHNIQUE: Multidetector CT imaging of the chest was performed using the standard protocol during bolus administration of intravenous contrast. Multiplanar CT image reconstructions and MIPs were obtained to evaluate the vascular anatomy. CONTRAST:  100 mL of Isovue 370 COMPARISON:  CT of the chest January 15, 2015. Chest x-ray May 13, 2017. FINDINGS: Cardiovascular: The aorta is not well opacified limiting evaluation. However, there  is no aneurysm or dissection identified. No significant atherosclerosis. The heart is unchanged. Artifact partially obscures evaluation of the upper lobe pulmonary arterial branches on the right. Taking the artifact into account, regions of decreased attenuation in upper lobe pulmonary arterial branches is thought to be artifactual rather than embolic. The regions of decreased attenuation demonstrate linear interfaces and affect both arteryies and veins. This is thought to be due to streak and beam hardening artifact. No convincing emboli identified. Mediastinum/Nodes: An NG tube terminates below today's film. A prominent node in the right hilum is new measuring up to 2 cm. No other adenopathy. The esophagus and thyroid are otherwise unremarkable. Lungs/Pleura: The ETT is in good position. The central airways are normal. There is bronchial wall thickening associated with right lower lobe airways with associated opacity, most consistent pneumonia or aspiration. Atelectasis is considered less likely tree-in-bud opacities are seen, most prominent in the left base, consistent with an infectious process. No pneumothorax. Emphysematous changes are noted. Atelectasis is seen in the left base. No suspicious nodules or masses Upper Abdomen: No acute abnormality. Musculoskeletal: No chest wall abnormality. No acute or significant osseous findings. Review of the MIP images confirms the above findings. IMPRESSION: 1. Evaluation of the right upper lobe pulmonary arteries is limited due to artifact. Taking this into account, there is no convincing evidence of pulmonary emboli. 2. Bronchial wall thickening and opacity in the right base most consistent with aspiration or pneumonia. Tree in bud opacities, particularly in the left lower lobe, also consistent with an infectious process. Recommend follow-up to resolution. 3. The prominent new node in the right hilum is probably reactive. Recommend attention on follow-up. Electronically  Signed   By: Dorise Bullion III M.D   On: 05/13/2017 13:23   Dg Chest Port 1 View  Result Date: 05/14/2017 CLINICAL DATA:  Intubation. EXAM: PORTABLE CHEST 1 VIEW COMPARISON:  CT 05/13/2017.  Chest x-ray 10/12/2016. FINDINGS: Endotracheal tube, NG tube in stable position. Heart size normal. Persistent basilar atelectasis. Persistent infiltrate in the right lung base. No pleural effusion or pneumothorax. Heart size stable . IMPRESSION: 1.  Lines and tubes in stable position. 2. Persistent basilar atelectasis and right base infiltrate. No interim change. Electronically Signed   By: Marcello Moores  Register   On: 05/14/2017 06:42   STUDIES:  Bronch 8/19- tracheal and left main and RUL collapse thick pus  CULTURES: 8/19 Sputum cx Bronch bal >>few GNR>>> 8/19 bc>>>  ANTIBIOTICS: 8/19 Vanc >> 8/19 Fortax  >>  SIGNIFICANT EVENTS: 8/19 ETT emergent, bronch with collapse  LINES/TUBES: 8/19 ETT >> PIV  DISCUSSION: 51 yo male smoker with Bipolar/substance abuse admitted in COPD exacerbation with progressive decline with hypercarbic respiratory failure requiring intubation /vent support on 8/19 .  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  SVT episode s/p adenosine 1 week prior to admit  HTN  P:  Hold home meds (Lisinopril/HCTZ) Monitor tele  RENAL hypophos  P:   Replace electrolytes as indicated  F/u chem  KVO IVF Lasix 8 mg/hr drip x24 hours Zaroxolyn 5 mg PO x1 dose Acetazolamide 250 mg IV q6 x3 doses KCl  GASTROINTESTINAL A:   NPO  P:   PPI  TF per nutrition  HEMATOLOGIC A:  Secondary polycythemia dvt prevention  P:   Lovenox SQ Active diureses  INFECTIOUS A:   COPD exacerbation \hcap P:   Follow culture- BAL bronch  Vanc /Fortaz  Send BC D/ced azithro HIV negative  ENDOCRINE A:    Hyperglycemia  P:   Add SSI as indicated   NEUROLOGIC A:   Seizure disorder, vent dyscherony P:   RASS goal: -1  Cont seizure regimen  D/C propofol PRN versed - home benzo  FAMILY  - Updates: no family available 8/22  - Inter-disciplinary family meet or Palliative Care meeting due by:  05/18/17   The patient is critically ill with multiple organ systems failure and requires high complexity decision making for assessment and support, frequent evaluation and titration of therapies, application of advanced monitoring technologies and extensive interpretation of multiple databases.   Critical Care Time devoted to patient care services described in this note is  35  Minutes. This time reflects time of care of this signee Dr WJennet Maduro This critical care time does not reflect procedure time, or teaching time or supervisory time of PA/NP/Med student/Med Resident etc but could involve care discussion time.  WRush Farmer M.D. LUniversity Of Maryland Medical CenterPulmonary/Critical Care Medicine. Pager: [phone] After hours pager: [phone]

## 2017-05-15 ENCOUNTER — Inpatient Hospital Stay (HOSPITAL_COMMUNITY): Payer: Medicare Other

## 2017-05-15 LAB — GLUCOSE, CAPILLARY
GLUCOSE-CAPILLARY: 191 mg/dL — AB (ref 65–99)
GLUCOSE-CAPILLARY: 246 mg/dL — AB (ref 65–99)
GLUCOSE-CAPILLARY: 177 mg/dL — AB (ref 65–99)
Glucose-Capillary: 229 mg/dL — ABNORMAL HIGH (ref 65–99)
Glucose-Capillary: 205 mg/dL — ABNORMAL HIGH (ref 65–99)

## 2017-05-15 LAB — BLOOD GAS, ARTERIAL
Acid-Base Excess: 20.4 mmol/L — ABNORMAL HIGH (ref 0.0–2.0)
Bicarbonate: 46.3 mmol/L — ABNORMAL HIGH (ref 20.0–28.0)
Drawn by: 40415
FIO2: 70
O2 SAT: 94.3 %
PEEP: 10 cmH2O
PH ART: 7.424 (ref 7.350–7.450)
Patient temperature: 98.6
RATE: 22 resp/min
VT: 620 mL
pCO2 arterial: 72.1 mmHg (ref 32.0–48.0)
pO2, Arterial: 73.7 mmHg — ABNORMAL LOW (ref 83.0–108.0)

## 2017-05-15 LAB — BASIC METABOLIC PANEL
ANION GAP: 8 (ref 5–15)
BUN: 47 mg/dL — ABNORMAL HIGH (ref 6–20)
CALCIUM: 9.5 mg/dL (ref 8.9–10.3)
CHLORIDE: 87 mmol/L — AB (ref 101–111)
CO2: 47 mmol/L — AB (ref 22–32)
Creatinine, Ser: 0.87 mg/dL (ref 0.61–1.24)
GFR calc non Af Amer: >60 mL/min (ref >60)
Glucose, Bld: 181 mg/dL — ABNORMAL HIGH (ref 65–99)
Potassium: 3.6 mmol/L (ref 3.5–5.1)
Sodium: 142 mmol/L (ref 135–145)

## 2017-05-15 LAB — CBC
HEMATOCRIT: 51.4 % (ref 39.0–52.0)
HEMOGLOBIN: 15.9 g/dL (ref 13.0–17.0)
MCH: 32.6 pg (ref 26.0–34.0)
MCHC: 30.9 g/dL (ref 30.0–36.0)
MCV: 105.5 fL — ABNORMAL HIGH (ref 78.0–100.0)
Platelets: 134 10*3/uL — ABNORMAL LOW (ref 150–400)
RBC: 4.87 MIL/uL (ref 4.22–5.81)
RDW: 13.3 % (ref 11.5–15.5)
WBC: 10.1 10*3/uL (ref 4.0–10.5)

## 2017-05-15 LAB — CULTURE, RESPIRATORY

## 2017-05-15 LAB — PROCALCITONIN

## 2017-05-15 LAB — MAGNESIUM: Magnesium: 3 mg/dL — ABNORMAL HIGH (ref 1.7–2.4)

## 2017-05-15 LAB — CULTURE, RESPIRATORY W GRAM STAIN

## 2017-05-15 LAB — PHOSPHORUS: PHOSPHORUS: 4.5 mg/dL (ref 2.5–4.6)

## 2017-05-15 MED ORDER — INSULIN ASPART 100 UNIT/ML ~~LOC~~ SOLN
4.0000 [IU] | SUBCUTANEOUS | Status: DC
  Administered 2017-05-15 – 2017-05-20 (×28): 4 [IU] via SUBCUTANEOUS

## 2017-05-15 MED ORDER — POTASSIUM CHLORIDE 20 MEQ/15ML (10%) PO SOLN
40.0000 meq | Freq: Three times a day (TID) | ORAL | Status: AC
  Administered 2017-05-15 (×2): 40 meq
  Filled 2017-05-15 (×2): qty 30

## 2017-05-15 MED ORDER — INSULIN ASPART 100 UNIT/ML ~~LOC~~ SOLN
0.0000 [IU] | SUBCUTANEOUS | Status: DC
  Administered 2017-05-15: 5 [IU] via SUBCUTANEOUS
  Administered 2017-05-15: 3 [IU] via SUBCUTANEOUS
  Administered 2017-05-16: 8 [IU] via SUBCUTANEOUS
  Administered 2017-05-16: 3 [IU] via SUBCUTANEOUS
  Administered 2017-05-16: 5 [IU] via SUBCUTANEOUS
  Administered 2017-05-16: 3 [IU] via SUBCUTANEOUS
  Administered 2017-05-16: 5 [IU] via SUBCUTANEOUS
  Administered 2017-05-16: 3 [IU] via SUBCUTANEOUS
  Administered 2017-05-17: 8 [IU] via SUBCUTANEOUS
  Administered 2017-05-17: 2 [IU] via SUBCUTANEOUS
  Administered 2017-05-17 (×3): 5 [IU] via SUBCUTANEOUS
  Administered 2017-05-17: 8 [IU] via SUBCUTANEOUS
  Administered 2017-05-18 (×4): 3 [IU] via SUBCUTANEOUS
  Administered 2017-05-18: 2 [IU] via SUBCUTANEOUS
  Administered 2017-05-18: 3 [IU] via SUBCUTANEOUS
  Administered 2017-05-19: 2 [IU] via SUBCUTANEOUS
  Administered 2017-05-19 (×2): 3 [IU] via SUBCUTANEOUS
  Administered 2017-05-19 (×3): 2 [IU] via SUBCUTANEOUS
  Administered 2017-05-20: 3 [IU] via SUBCUTANEOUS
  Administered 2017-05-20 – 2017-05-21 (×2): 5 [IU] via SUBCUTANEOUS
  Administered 2017-05-21: 2 [IU] via SUBCUTANEOUS
  Administered 2017-05-22: 8 [IU] via SUBCUTANEOUS
  Administered 2017-05-22: 5 [IU] via SUBCUTANEOUS
  Administered 2017-05-22 (×3): 3 [IU] via SUBCUTANEOUS
  Administered 2017-05-22: 5 [IU] via SUBCUTANEOUS
  Administered 2017-05-22 – 2017-05-23 (×2): 3 [IU] via SUBCUTANEOUS
  Administered 2017-05-23: 5 [IU] via SUBCUTANEOUS

## 2017-05-15 MED ORDER — FUROSEMIDE 10 MG/ML IJ SOLN
8.0000 mg/h | INTRAVENOUS | Status: DC
  Administered 2017-05-15: 8 mg/h via INTRAVENOUS
  Filled 2017-05-15: qty 25

## 2017-05-15 MED ORDER — METHYLPREDNISOLONE SODIUM SUCC 40 MG IJ SOLR
40.0000 mg | Freq: Two times a day (BID) | INTRAMUSCULAR | Status: DC
  Administered 2017-05-15 – 2017-05-19 (×8): 40 mg via INTRAVENOUS
  Filled 2017-05-15 (×10): qty 1

## 2017-05-15 MED ORDER — INSULIN ASPART 100 UNIT/ML ~~LOC~~ SOLN: 0.0000 [IU] | Freq: Three times a day (TID) | SUBCUTANEOUS | Status: DC

## 2017-05-15 MED ORDER — FLEET ENEMA 7-19 GM/118ML RE ENEM
1.0000 | ENEMA | Freq: Once | RECTAL | Status: AC
  Administered 2017-05-15: 1 via RECTAL
  Filled 2017-05-15: qty 1

## 2017-05-15 MED ORDER — METOLAZONE 5 MG PO TABS
5.0000 mg | ORAL_TABLET | Freq: Every day | ORAL | Status: AC
  Administered 2017-05-15: 5 mg via ORAL
  Filled 2017-05-15: qty 1

## 2017-05-15 NOTE — Care Management Note (Signed)
Case Management Note  Patient Details  Name: Harold Bailey MRN: [id number] Date of Birth: Aug 13, 1966  Subjective/Objective:     Pt admitted with resp distress and PNA  - progressively declined in ED requiring intubation        Action/Plan:   PTA from home with spouse.  CM will continue to follow for discharge needs   Expected Discharge Date:                  Expected Discharge Plan:     In-House Referral:     Discharge planning Services  CM Consult  Post Acute Care Choice:    Choice offered to:     DME Arranged:    DME Agency:     HH Arranged:    HH Agency:     Status of Service:     If discussed at [employer], dates discussed:    Additional Comments:  Maryclare Labrador, RN 05/15/2017, 4:25 PM

## 2017-05-15 NOTE — Progress Notes (Signed)
PULMONARY / CRITICAL CARE MEDICINE   Name: Harold Bailey MRN: [id number] DOB: Sep 24, 1965    ADMISSION DATE:  05/10/2017 CONSULTATION DATE:  05/11/17  REFERRING MD:  Dr. Loleta Books  CHIEF COMPLAINT:  Resp Distress   HISTORY OF PRESENT ILLNESS:   51 yo male smoker with hx of COPD/Asthma , Seizure disorder, SVT , Bipolar, substance abuse with recent admission to outlying hospital with chest pain. He had SVT, cardioverted with Adenosine . LHC showed nonobstructive CAD. He developed bronchitic symptoms during this admission and was treated with prednisone and omnicef. CXR and CT chest were reported as neg for PNA. 2 days prior to admission he developed progressively worsening sob, cough and wheezing . He was brought to ER on 8/18 and admitted by [facility] . He was started on solumedrol, nebs, and abx. LA was normal . CXR showed on pna.  Early am on 819 with progressive decline with hypoxia, increased wob and lethargy . PCCM asked to consult. Pt was moved to ICU and intubated. Bedside bronchoscopy showed very thick airway secretions. Airway .    SUBJECTIVE:  No events overnight, diuresed well.  VITAL SIGNS: BP 125/88 (BP Location: Right Arm)   Pulse 65   Temp 99.1 F (37.3 C) (Oral)   Resp (!) 22   Ht 6' (1.829 m)   Wt 106.5 kg (234 lb 12.6 oz)   SpO2 (!) 89%   BMI 31.84 kg/m   HEMODYNAMICS: CVP:  [12 mmHg-19 mmHg] 12 mmHg  VENTILATOR SETTINGS: Vent Mode: PRVC FiO2 (%):  [40 %-70 %] 60 % Set Rate:  [22 bmp] 22 bmp Vt Set:  [884 mL] 620 mL PEEP:  [10 cmH20] 10 cmH20 Plateau Pressure:  [19 cmH20-21 cmH20] 19 cmH20  INTAKE / OUTPUT: I/O last 3 completed shifts: In: 4541.1 [I.V.:1671.1; NG/GT:1170; IV Piggyback:1700] Out: 16606 [Urine:10550]  PHYSICAL EXAMINATION: General:  Obese male sedated on vent, NAD. Neuro: Sedated, RASS of 0. HEENT:  ETT in place, [place]/AT, PERRL. Cardiovascular:  RRR, Nl S1/S2, -M/R/G. Lungs: decreased aeration , +wheezing throughout ,  Poor air movement   Abdomen:  Obese, NT, ND and BS+. Musculoskeletal:  -edema and -tenderness Skin:  No rash, intact  LABS:  BMET  Recent Labs Lab 05/14/17 0243 05/14/17 2024 05/15/17 0440  NA 145 143 142  K 3.9 3.5 3.6  CL 94* 92* 87*  CO2 42* 43* 47*  BUN 35* 37* 47*  CREATININE 0.72 0.75 0.87  GLUCOSE 155* 207* 181*   Electrolytes  Recent Labs Lab 05/13/17 0318 05/14/17 0243 05/14/17 2024 05/15/17 0440  CALCIUM 9.1 9.0 9.1 9.5  MG 2.5* 2.3  --  3.0*  PHOS 3.5 3.3  --  4.5    CBC  Recent Labs Lab 05/13/17 0318 05/14/17 0243 05/15/17 0440  WBC 13.6* 11.8* 10.1  HGB 14.7 14.5 15.9  HCT 47.0 47.4 51.4  PLT 143* 149* 134*   Coag's No results for input(s): APTT, INR in the last 168 hours.  Sepsis Markers  Recent Labs Lab 05/10/17 1957 05/13/17 1137 05/14/17 0243 05/15/17 0440  LATICACIDVEN 1.70  --   --   --   PROCALCITON  --  <0.10 <0.10 <0.10   ABG  Recent Labs Lab 05/13/17 0310 05/14/17 0445 05/15/17 0355  PHART 7.353 7.421 7.424  PCO2ART 69.0* 70.8* 72.1*  PO2ART 101 71.7* 73.7*   Liver Enzymes  Recent Labs Lab 05/12/17 0455  AST 12*  ALT 23  ALKPHOS 62  BILITOT 0.5  ALBUMIN 2.8*   Cardiac Enzymes No  results for input(s): TROPONINI, PROBNP in the last 168 hours.  Glucose  Recent Labs Lab 05/14/17 1645 05/14/17 2013 05/14/17 2332 05/15/17 0332 05/15/17 0810 05/15/17 1132  GLUCAP 166* 213* 241* 229* 191* 205*    Imaging Dg Chest Port 1 View  Result Date: 05/15/2017 CLINICAL DATA:  Routine, intubated EXAM: PORTABLE CHEST 1 VIEW COMPARISON:  05/14/2017 FINDINGS: Endotracheal tube and NG tube are unchanged. There is bibasilar atelectasis, improved since prior study. Heart is upper limits normal in size. No effusions or acute bony abnormality. Left PICC line is in place with the tip in the SVC. IMPRESSION: Normal bibasilar atelectasis, improving since prior study. Electronically Signed   By: Rolm Baptise M.D.   On: 05/15/2017 07:13    STUDIES:  Bronch 8/19- tracheal and left main and RUL collapse thick pus  CULTURES: 8/19 Sputum cx Bronch bal >>MRSA 8/19 bc>>>  ANTIBIOTICS: 8/19 Vanc >> 8/19 Fortax>>>8/23  SIGNIFICANT EVENTS: 8/19 ETT emergent, bronch with collapse  LINES/TUBES: 8/19 ETT >> PIV  DISCUSSION: 51 yo male smoker with Bipolar/substance abuse admitted in COPD exacerbation with progressive decline with hypercarbic respiratory failure requiring intubation /vent support on 8/19 .  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  SVT episode s/p adenosine 1 week prior to admit  HTN  P:  Hold home meds (Lisinopril/HCTZ) Monitor tele  RENAL hypophos  P:   Replace electrolytes as indicated  F/u chem  KVO IVF Lasix 8 mg/hr drip x24 hours Zaroxolyn 5 mg PO x1 dose Acetazolamide 250 mg IV q6 x3 doses KCl  GASTROINTESTINAL A:   NPO  P:   PPI  TF per nutrition  HEMATOLOGIC A:  Secondary polycythemia dvt prevention  P:  Lovenox SQ Active diureses  INFECTIOUS A:   COPD exacerbation \hcap P:   Follow culture- BAL bronch  Vanc D/C fortaz 8/23 Send BC D/ced azithro HIV negative  ENDOCRINE A:    Hyperglycemia  P:   Add SSI as indicated   NEUROLOGIC A:   Seizure disorder, vent dyscherony P:   RASS goal: -1  Cont seizure regimen  D/Ced propofol PRN versed - home benzo  FAMILY  - Updates: no family available 8/23  - Inter-disciplinary family meet or Palliative Care meeting due by:  05/18/17   The patient is critically ill with multiple organ systems failure and requires high complexity decision making for assessment and support,  frequent evaluation and titration of therapies, application of advanced monitoring technologies and extensive interpretation of multiple databases.   Critical Care Time devoted to patient care services described in this note is  35  Minutes. This time reflects time of care of this signee Dr WJennet Maduro This critical care time does not reflect procedure time, or teaching time or supervisory time of PA/NP/Med student/Med Resident etc but could involve care discussion time.  WRush Farmer M.D. [facility] Hospital[facility]ulmonary/Critical Care Medicine. Pager: [phone] After hours pager: [phone]

## 2017-05-16 ENCOUNTER — Inpatient Hospital Stay (HOSPITAL_COMMUNITY): Payer: Medicare Other

## 2017-05-16 LAB — BASIC METABOLIC PANEL
ANION GAP: 13 (ref 5–15)
BUN: 58 mg/dL — ABNORMAL HIGH (ref 6–20)
CALCIUM: 10 mg/dL (ref 8.9–10.3)
CHLORIDE: 81 mmol/L — AB (ref 101–111)
CO2: 47 mmol/L — AB (ref 22–32)
Creatinine, Ser: 0.96 mg/dL (ref 0.61–1.24)
GFR calc non Af Amer: >60 mL/min (ref >60)
Glucose, Bld: 193 mg/dL — ABNORMAL HIGH (ref 65–99)
Potassium: 3.4 mmol/L — ABNORMAL LOW (ref 3.5–5.1)
SODIUM: 141 mmol/L (ref 135–145)

## 2017-05-16 LAB — BLOOD GAS, ARTERIAL
Acid-Base Excess: 22.8 mmol/L — ABNORMAL HIGH (ref 0.0–2.0)
Bicarbonate: 48.8 mmol/L — ABNORMAL HIGH (ref 20.0–28.0)
DRAWN BY: 41977
FIO2: 70
O2 Saturation: 95.4 %
PEEP: 10 cmH2O
PH ART: 7.45 (ref 7.350–7.450)
Patient temperature: 98.6
RATE: 22 resp/min
VT: 620 mL
pCO2 arterial: 71.2 mmHg (ref 32.0–48.0)
pO2, Arterial: 80.1 mmHg — ABNORMAL LOW (ref 83.0–108.0)

## 2017-05-16 LAB — CBC
HEMATOCRIT: 54.8 % — AB (ref 39.0–52.0)
HEMOGLOBIN: 17.8 g/dL — AB (ref 13.0–17.0)
MCH: 34 pg (ref 26.0–34.0)
MCHC: 32.5 g/dL (ref 30.0–36.0)
MCV: 104.6 fL — ABNORMAL HIGH (ref 78.0–100.0)
Platelets: 105 10*3/uL — ABNORMAL LOW (ref 150–400)
RBC: 5.24 MIL/uL (ref 4.22–5.81)
RDW: 13.5 % (ref 11.5–15.5)
WBC: 26.3 10*3/uL — AB (ref 4.0–10.5)

## 2017-05-16 LAB — GLUCOSE, CAPILLARY
GLUCOSE-CAPILLARY: 190 mg/dL — AB (ref 65–99)
GLUCOSE-CAPILLARY: 255 mg/dL — AB (ref 65–99)
Glucose-Capillary: 182 mg/dL — ABNORMAL HIGH (ref 65–99)
Glucose-Capillary: 188 mg/dL — ABNORMAL HIGH (ref 65–99)
Glucose-Capillary: 238 mg/dL — ABNORMAL HIGH (ref 65–99)

## 2017-05-16 LAB — MAGNESIUM: MAGNESIUM: 2.7 mg/dL — AB (ref 1.7–2.4)

## 2017-05-16 LAB — VANCOMYCIN, TROUGH: VANCOMYCIN TR: 18 ug/mL (ref 15–20)

## 2017-05-16 LAB — C DIFFICILE QUICK SCREEN W PCR REFLEX
C Diff antigen: POSITIVE — AB
C Diff interpretation: DETECTED
C Diff toxin: POSITIVE — AB

## 2017-05-16 LAB — PHOSPHORUS: PHOSPHORUS: 4.3 mg/dL (ref 2.5–4.6)

## 2017-05-16 MED ORDER — VANCOMYCIN 50 MG/ML ORAL SOLUTION
125.0000 mg | Freq: Four times a day (QID) | ORAL | Status: DC
  Administered 2017-05-16 – 2017-05-22 (×19): 125 mg via ORAL
  Filled 2017-05-16 (×23): qty 2.5

## 2017-05-16 MED ORDER — DEXTROSE 5 % IV SOLN
2.0000 g | Freq: Three times a day (TID) | INTRAVENOUS | Status: DC
  Administered 2017-05-16 – 2017-05-20 (×13): 2 g via INTRAVENOUS
  Filled 2017-05-16 (×13): qty 2

## 2017-05-16 MED ORDER — ACETAZOLAMIDE SODIUM 500 MG IJ SOLR
250.0000 mg | Freq: Four times a day (QID) | INTRAMUSCULAR | Status: DC
Start: 2017-05-16 — End: 2017-05-16
  Administered 2017-05-16: 250 mg via INTRAVENOUS
  Filled 2017-05-16 (×2): qty 250

## 2017-05-16 MED ORDER — POTASSIUM CHLORIDE 20 MEQ/15ML (10%) PO SOLN
40.0000 meq | Freq: Three times a day (TID) | ORAL | Status: AC
  Administered 2017-05-16 (×2): 40 meq
  Filled 2017-05-16 (×3): qty 30

## 2017-05-16 MED ORDER — FUROSEMIDE 10 MG/ML IJ SOLN
40.0000 mg | Freq: Four times a day (QID) | INTRAMUSCULAR | Status: DC
Start: 2017-05-16 — End: 2017-05-16
  Administered 2017-05-16: 40 mg via INTRAVENOUS
  Filled 2017-05-16 (×2): qty 4

## 2017-05-16 MED ORDER — FAMOTIDINE 40 MG/5ML PO SUSR
20.0000 mg | Freq: Two times a day (BID) | ORAL | Status: DC
  Administered 2017-05-16 – 2017-05-19 (×7): 20 mg
  Filled 2017-05-16 (×8): qty 2.5

## 2017-05-16 NOTE — Progress Notes (Signed)
Pharmacy Antibiotic Note  Harold Bailey is a 51 y.o. male admitted on 05/10/2017 with shortness of breath and pneumonia. The patient has been on vancomycin since 05/11/2017. A vancomycin trough was drawn today and was at goal at 18. The respiratory culture is positive for MRSA, but broadening coverage today after de-escalating yesterday as there was a spike in WBC to 26.3 today and patient has been febrile overnight. Patient is being diuresed currently and has good urine output and stable serum creatinine.   Plan: -Continue Vancomycin 1515m IV q12 hours  -target trough: 15-20  -re-start Ceftazidime 2g IV q8 hours  -Monitor renal fx, cultures, vancomycin levels as indicated    Height: 6' (182.9 cm) Weight: 226 lb 3.1 oz (102.6 kg) IBW/kg (Calculated) : 77.6  Temp (24hrs), Avg:100.6 F (38.1 C), Min:98.6 F (37 C), Max:102.8 F (39.3 C)   Recent Labs Lab 05/10/17 1957  05/12/17 0455 05/13/17 0318 05/13/17 1137 05/14/17 0243 05/14/17 2024 05/15/17 0440 05/16/17 0240  WBC  --   < > 12.5* 13.6*  --  11.8*  --  10.1 26.3*  CREATININE  --   < > 0.72 0.78  --  0.72 0.75 0.87 0.96  LATICACIDVEN 1.70  --   --   --   --   --   --   --   --   VANCOTROUGH  --   --   --   --  7*  --   --   --  18  < > = values in this interval not displayed.  Estimated Creatinine Clearance: 111.5 mL/min (by C-G formula based on SCr of 0.96 mg/dL).    Allergies  Allergen Reactions  . Hydrocodone Rash  . No Known Allergies     Antimicrobials this admission: 8/19 ceftriaxone x1 8/19 azithromycin >>8/20 8/19 ceftazidime >>8/23, 8/24>> 8/19 vancomycin >>   Microbiology results: 8/19 mrsa pcr: positive  8/19 BAL: MRSA   SJalene Mullet Pharm.D. PGY1 Pharmacy Resident 05/16/2017 1:14 PM Main Pharmacy: [phone]

## 2017-05-16 NOTE — Progress Notes (Signed)
RN notified of pt temp of 102.8 F orally 

## 2017-05-16 NOTE — Progress Notes (Signed)
eLink Physician-Brief Progress Note Patient Name: Harold Bailey DOB: [birth year] MRN: [id number]   Date of Service  05/16/2017  HPI/Events of Note  Notified of positive C. difficile.   eICU Interventions  1. Started on Vancocin 125 mg via tube every 6 hours  2. Discontinuing Protonix 3. Starting Pepcid 20 mg via tube twice a day      Intervention Category Major Interventions: Sepsis - evaluation and management  Tera Partridge 05/16/2017, 7:26 PM

## 2017-05-16 NOTE — Progress Notes (Signed)
eLink Physician-Brief Progress Note Patient Name: Harold Bailey DOB: [birth year] MRN: [id number]   Date of Service  05/16/2017  HPI/Events of Note  Fever to 101.4 F. Currently on Vancomycin for MRSA is sputum.   eICU Interventions  Will order: 1. Blood cultures X 2 now. 2. Please give ordered Tylenol.      Intervention Category Major Interventions: Infection - evaluation and management  Sommer,Steven Eugene 05/16/2017, 4:15 AM

## 2017-05-16 NOTE — Progress Notes (Signed)
eLink Physician-Brief Progress Note Patient Name: KIN GALBRAITH DOB: [birth year] MRN: [id number]   Date of Service  05/16/2017  HPI/Events of Note  Contacted by bedside nurse regarding persistent fever despite ice packs and Tylenol. Currently on broad-spectrum antibiotics. Cultures obtained today.   eICU Interventions  Verbal order for cooling blanket.      Intervention Category Intermediate Interventions: Other:  Tera Partridge 05/16/2017, 4:32 PM

## 2017-05-16 NOTE — Progress Notes (Signed)
RN notified of pt temp of 102.6 F orally.

## 2017-05-16 NOTE — Progress Notes (Signed)
CRITICAL VALUE ALERT  Critical Value:  Positive c-diff  Date & Time Notied:  05/16/17 1915  Provider Notified: Ashok Cordia  Orders Received/Actions taken: po vanc, pepcid

## 2017-05-16 NOTE — Progress Notes (Signed)
PULMONARY / CRITICAL CARE MEDICINE   Name: Harold Bailey MRN: [id number] DOB: 07/07/1966    ADMISSION DATE:  05/10/2017 CONSULTATION DATE:  05/11/17  REFERRING MD:  Dr. Loleta Books  CHIEF COMPLAINT:  Resp Distress   HISTORY OF PRESENT ILLNESS:   51 yo male smoker with hx of COPD/Asthma , Seizure disorder, SVT , Bipolar, substance abuse with recent admission to outlying hospital with chest pain. He had SVT, cardioverted with Adenosine . LHC showed nonobstructive CAD. He developed bronchitic symptoms during this admission and was treated with prednisone and omnicef. CXR and CT chest were reported as neg for PNA. 2 days prior to admission he developed progressively worsening sob, cough and wheezing . He was brought to ER on 8/18 and admitted by [facility] . He was started on solumedrol, nebs, and abx. LA was normal . CXR showed on pna.  Early am on 819 with progressive decline with hypoxia, increased wob and lethargy . PCCM asked to consult. Pt was moved to ICU and intubated. Bedside bronchoscopy showed very thick airway secretions. Airway .    SUBJECTIVE:  No events overnight, diuresed well.  VITAL SIGNS: BP 101/74   Pulse 90   Temp (!) 102.8 F (39.3 C) (Oral)   Resp (!) 22   Ht 6' (1.829 m)   Wt 102.6 kg (226 lb 3.1 oz)   SpO2 93%   BMI 30.68 kg/m   HEMODYNAMICS: CVP:  [6 mmHg-11 mmHg] 9 mmHg  VENTILATOR SETTINGS: Vent Mode: PRVC FiO2 (%):  [60 %-70 %] 70 % Set Rate:  [22 bmp] 22 bmp Vt Set:  [878 mL] 620 mL PEEP:  [10 cmH20] 10 cmH20 Plateau Pressure:  [18 cmH20-20 cmH20] 19 cmH20  INTAKE / OUTPUT: I/O last 3 completed shifts: In: 6438.4 [I.V.:1948.4; NG/GT:2390; IV Piggyback:2100] Out: 10210 [Urine:10210]  PHYSICAL EXAMINATION: General:  Obese male sedated on vent, NAD. Neuro: Sedated, RASS of 0. HEENT:  ETT in place, [place]/AT, PERRL. Cardiovascular:  RRR, Nl S1/S2, -M/R/G. Lungs: decreased aeration , +wheezing throughout ,  Poor air movement  Abdomen:  Obese, NT, ND and  BS+. Musculoskeletal:  -edema and -tenderness Skin:  No rash, intact  LABS:  BMET  Recent Labs Lab 05/14/17 2024 05/15/17 0440 05/16/17 0240  NA 143 142 141  K 3.5 3.6 3.4*  CL 92* 87* 81*  CO2 43* 47* 47*  BUN 37* 47* 58*  CREATININE 0.75 0.87 0.96  GLUCOSE 207* 181* 193*   Electrolytes  Recent Labs Lab 05/14/17 0243 05/14/17 2024 05/15/17 0440 05/16/17 0240  CALCIUM 9.0 9.1 9.5 10.0  MG 2.3  --  3.0* 2.7*  PHOS 3.3  --  4.5 4.3    CBC  Recent Labs Lab 05/14/17 0243 05/15/17 0440 05/16/17 0240  WBC 11.8* 10.1 26.3*  HGB 14.5 15.9 17.8*  HCT 47.4 51.4 54.8*  PLT 149* 134* 105*   Coag's No results for input(s): APTT, INR in the last 168 hours.  Sepsis Markers  Recent Labs Lab 05/10/17 1957 05/13/17 1137 05/14/17 0243 05/15/17 0440  LATICACIDVEN 1.70  --   --   --   PROCALCITON  --  <0.10 <0.10 <0.10   ABG  Recent Labs Lab 05/14/17 0445 05/15/17 0355 05/16/17 0510  PHART 7.421 7.424 7.450  PCO2ART 70.8* 72.1* 71.2*  PO2ART 71.7* 73.7* 80.1*   Liver Enzymes  Recent Labs Lab 05/12/17 0455  AST 12*  ALT 23  ALKPHOS 62  BILITOT 0.5  ALBUMIN 2.8*   Cardiac Enzymes No results for input(s): TROPONINI,  PROBNP in the last 168 hours.  Glucose  Recent Labs Lab 05/15/17 1132 05/15/17 1552 05/15/17 2026 05/16/17 0009 05/16/17 0325 05/16/17 0832  GLUCAP 205* 246* 177* 238* 188* 190*    Imaging Dg Chest Port 1 View  Result Date: 05/16/2017 CLINICAL DATA:  Intubation. EXAM: PORTABLE CHEST 1 VIEW COMPARISON:  05/15/2017.  CT 05/13/2017 . FINDINGS: Endotracheal tube, NG tube, left PICC line stable position. Heart size normal. Bibasilar atelectasis and infiltrate, right side greater than left again noted. Interim slight worsening from prior exam . No pneumothorax. IMPRESSION: 1. Lines and tubes in stable position. 2. Bibasilar atelectasis and infiltrate, right side greater than left again noted . Interim slight worsening from prior exam  . Electronically Signed   By: Marcello Moores  Register   On: 05/16/2017 06:56   STUDIES:  Bronch 8/19- tracheal and left main and RUL collapse thick pus  CULTURES: 8/19 Sputum cx Bronch bal >>MRSA 8/19 bc>>>  ANTIBIOTICS: 8/19 Vanc >> 8/19 Fortax>>>8/23  SIGNIFICANT EVENTS: 8/19 ETT emergent, bronch with collapse  LINES/TUBES: 8/19 ETT >> PIV  DISCUSSION: 51 yo male smoker with Bipolar/substance abuse admitted in COPD exacerbation with progressive decline with hypercarbic respiratory failure requiring intubation /vent support on 8/19 .  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  SVT episode s/p adenosine 1 week prior to admit  HTN  P:  Hold home meds (Lisinopril/HCTZ) Monitor tele  RENAL hypophos  P:   Replace electrolytes as indicated  F/u chem  KVO IVF Lasix to 40 mg IV q6 x3 doses Acetazolamide 250 mg IV q6 x3 doses KCl  GASTROINTESTINAL A:   NPO  P:   PPI  TF per nutrition  HEMATOLOGIC A:  Secondary polycythemia dvt prevention  P:  Lovenox SQ Active diureses as above  INFECTIOUS A:   COPD exacerbation \hcap P:   Follow culture- BAL bronch  Vanc Restart ceftaz given fever and elevated WBC overnight F/U BC D/ced azithro HIV negative  ENDOCRINE A:    Hyperglycemia  P:   Add SSI as indicated   NEUROLOGIC A:   Seizure disorder, vent dyscherony P:   RASS goal: -1  Cont seizure regimen  D/Ced propofol PRN versed - home benzo  FAMILY  - Updates: no family available 8/25  - Inter-disciplinary family meet or Palliative Care meeting due by:  05/18/17   The patient is critically ill with multiple organ  systems failure and requires high complexity decision making for assessment and support, frequent evaluation and titration of therapies, application of advanced monitoring technologies and extensive interpretation of multiple databases.   Critical Care Time devoted to patient care services described in this note is  35  Minutes. This time reflects time of care of this signee Dr WJennet Maduro This critical care time does not reflect procedure time, or teaching time or supervisory time of PA/NP/Med student/Med Resident etc but could involve care discussion time.  WRush Farmer M.D. [facility] CenterPulmonary/Critical Care Medicine. Pager: [phone] After hours pager: [phone]

## 2017-05-16 NOTE — Progress Notes (Signed)
eLink Physician-Brief Progress Note Patient Name: CHANDLOR NOECKER DOB: September 11, 1966 MRN: [id number]   Date of Service  05/16/2017  HPI/Events of Note  Patient with progressively worsening hypotension. Ordered scheduled Diamox and Lasix for diuresis. Has central IV access. Currently on mechanical ventilation. Concern for sepsis.   eICU Interventions  1. Holding on IV fluid bolus 2. Discontinuing Diamox and Lasix 3. Discontinuing Breo from MAP 4. Requested nurse initiated vasopressor infusion with Neo-Synephrine to target mean arterial pressure & systolic blood pressure      Intervention Category Major Interventions: Shock - evaluation and management  Tera Partridge 05/16/2017, 6:01 PM

## 2017-05-16 NOTE — Progress Notes (Signed)
eLink Physician-Brief Progress Note Patient Name: Harold Bailey DOB: 30-Jun-1966 MRN: [id number]   Date of Service  05/16/2017  HPI/Events of Note  Notified by bedside nurse of frequent bowel movements. Previously on Colace daily. Also on tube feedings. Leukocytosis significantly worse today and still febrile. Currently on broad-spectrum antibiotics for sepsis.   eICU Interventions  1. Enteric precautions 2. C. difficile testing 3. Rectal tube management system ordered      Intervention Category Major Interventions: Sepsis - evaluation and management  Tera Partridge 05/16/2017, 4:46 PM

## 2017-05-17 ENCOUNTER — Inpatient Hospital Stay (HOSPITAL_COMMUNITY): Payer: Medicare Other

## 2017-05-17 DIAGNOSIS — A419 Sepsis, unspecified organism: Secondary | ICD-10-CM

## 2017-05-17 DIAGNOSIS — R6521 Severe sepsis with septic shock: Secondary | ICD-10-CM

## 2017-05-17 LAB — BLOOD GAS, ARTERIAL
ACID-BASE EXCESS: 11.9 mmol/L — AB (ref 0.0–2.0)
BICARBONATE: 36.5 mmol/L — AB (ref 20.0–28.0)
DRAWN BY: 41977
FIO2: 50
LHR: 22 {breaths}/min
MECHVT: 620 mL
O2 SAT: 96.1 %
PEEP/CPAP: 10 cmH2O
PH ART: 7.451 — AB (ref 7.350–7.450)
Patient temperature: 98.6
pCO2 arterial: 53.2 mmHg — ABNORMAL HIGH (ref 32.0–48.0)
pO2, Arterial: 81.6 mmHg — ABNORMAL LOW (ref 83.0–108.0)

## 2017-05-17 LAB — CBC
HCT: 58.6 % — ABNORMAL HIGH (ref 39.0–52.0)
HEMOGLOBIN: 18.7 g/dL — AB (ref 13.0–17.0)
MCH: 33.3 pg (ref 26.0–34.0)
MCHC: 31.9 g/dL (ref 30.0–36.0)
MCV: 104.5 fL — AB (ref 78.0–100.0)
PLATELETS: 104 10*3/uL — AB (ref 150–400)
RBC: 5.61 MIL/uL (ref 4.22–5.81)
RDW: 13.8 % (ref 11.5–15.5)
WBC: 34.9 10*3/uL — AB (ref 4.0–10.5)

## 2017-05-17 LAB — BASIC METABOLIC PANEL
ANION GAP: 11 (ref 5–15)
BUN: 92 mg/dL — AB (ref 6–20)
CHLORIDE: 92 mmol/L — AB (ref 101–111)
CO2: 39 mmol/L — ABNORMAL HIGH (ref 22–32)
Calcium: 8.8 mg/dL — ABNORMAL LOW (ref 8.9–10.3)
Creatinine, Ser: 1.19 mg/dL (ref 0.61–1.24)
GFR calc Af Amer: >60 mL/min (ref >60)
Glucose, Bld: 261 mg/dL — ABNORMAL HIGH (ref 65–99)
POTASSIUM: 2.9 mmol/L — AB (ref 3.5–5.1)
Sodium: 142 mmol/L (ref 135–145)

## 2017-05-17 LAB — GLUCOSE, CAPILLARY
GLUCOSE-CAPILLARY: 146 mg/dL — AB (ref 65–99)
GLUCOSE-CAPILLARY: 209 mg/dL — AB (ref 65–99)
GLUCOSE-CAPILLARY: 245 mg/dL — AB (ref 65–99)
GLUCOSE-CAPILLARY: 272 mg/dL — AB (ref 65–99)
Glucose-Capillary: 181 mg/dL — ABNORMAL HIGH (ref 65–99)
Glucose-Capillary: 230 mg/dL — ABNORMAL HIGH (ref 65–99)
Glucose-Capillary: 240 mg/dL — ABNORMAL HIGH (ref 65–99)
Glucose-Capillary: 253 mg/dL — ABNORMAL HIGH (ref 65–99)

## 2017-05-17 LAB — MAGNESIUM: MAGNESIUM: 3.2 mg/dL — AB (ref 1.7–2.4)

## 2017-05-17 LAB — PHOSPHORUS: Phosphorus: 3.6 mg/dL (ref 2.5–4.6)

## 2017-05-17 MED ORDER — ACETAZOLAMIDE SODIUM 500 MG IJ SOLR
250.0000 mg | Freq: Four times a day (QID) | INTRAMUSCULAR | Status: AC
  Administered 2017-05-17 – 2017-05-18 (×3): 250 mg via INTRAVENOUS
  Filled 2017-05-17 (×3): qty 250

## 2017-05-17 MED ORDER — POTASSIUM CHLORIDE 20 MEQ/15ML (10%) PO SOLN
40.0000 meq | Freq: Once | ORAL | Status: AC
Start: 2017-05-17 — End: 2017-05-17
  Administered 2017-05-17: 40 meq

## 2017-05-17 MED ORDER — INSULIN GLARGINE 100 UNIT/ML ~~LOC~~ SOLN
15.0000 [IU] | Freq: Every day | SUBCUTANEOUS | Status: DC
  Administered 2017-05-17 – 2017-05-20 (×4): 15 [IU] via SUBCUTANEOUS
  Filled 2017-05-17 (×5): qty 0.15

## 2017-05-17 MED ORDER — FUROSEMIDE 10 MG/ML IJ SOLN
40.0000 mg | Freq: Four times a day (QID) | INTRAMUSCULAR | Status: AC
  Administered 2017-05-17 (×3): 40 mg via INTRAVENOUS
  Filled 2017-05-17 (×3): qty 4

## 2017-05-17 MED ORDER — POTASSIUM CHLORIDE 20 MEQ/15ML (10%) PO SOLN
40.0000 meq | ORAL | Status: AC
  Administered 2017-05-17 (×2): 40 meq
  Filled 2017-05-17 (×2): qty 30

## 2017-05-17 NOTE — Progress Notes (Signed)
PULMONARY / CRITICAL CARE MEDICINE   Name: Harold Bailey MRN: [id number] DOB: [birth year]    ADMISSION DATE:  05/10/2017 CONSULTATION DATE:  05/11/17  REFERRING MD:  Dr. Loleta Books  CHIEF COMPLAINT:  Resp Distress   HISTORY OF PRESENT ILLNESS:   51 yo male smoker with hx of COPD/Asthma , Seizure disorder, SVT , Bipolar, substance abuse with recent admission to outlying hospital with chest pain. He had SVT, cardioverted with Adenosine . LHC showed nonobstructive CAD. He developed bronchitic symptoms during this admission and was treated with prednisone and omnicef. CXR and CT chest were reported as neg for PNA. 2 days prior to admission he developed progressively worsening sob, cough and wheezing . He was brought to ER on 8/18 and admitted by [facility] . He was started on solumedrol, nebs, and abx. LA was normal . CXR showed on pna.  Early am on 819 with progressive decline with hypoxia, increased wob and lethargy . PCCM asked to consult. Pt was moved to ICU and intubated. Bedside bronchoscopy showed very thick airway secretions. Airway .    SUBJECTIVE:  Diarrhea and c diff positive overnight  VITAL SIGNS: BP 102/80   Pulse 70   Temp 98.7 F (37.1 C) (Rectal)   Resp (!) 22   Ht 6' (1.829 m)   Wt 102.8 kg (226 lb 10.1 oz)   SpO2 96%   BMI 30.74 kg/m   HEMODYNAMICS: CVP:  [7 mmHg-10 mmHg] 7 mmHg  VENTILATOR SETTINGS: Vent Mode: PRVC FiO2 (%):  [50 %-70 %] 50 % Set Rate:  [22 bmp] 22 bmp Vt Set:  [088 mL] 620 mL PEEP:  [8 cmH20-10 cmH20] 8 cmH20 Plateau Pressure:  [15 cmH20-19 cmH20] 15 cmH20  INTAKE / OUTPUT: I/O last 3 completed shifts: In: 7235.9 [I.V.:1915.9; Other:90; NG/GT:3080; IV PJSRPRXYV:8592] Out: 9244 [Urine:5705; Stool:700]  PHYSICAL EXAMINATION: General:  Obese male sedated on vent, NAD. Neuro: Sedated, RASS of 0. HEENT:  ETT in place, [place]/AT, PERRL. Cardiovascular:  RRR, Nl S1/S2, -M/R/G. Lungs: decreased aeration , +wheezing throughout ,  Poor air movement   Abdomen:  Obese, NT, ND and BS+. Musculoskeletal:  -edema and -tenderness Skin:  No rash, intact  LABS:  BMET  Recent Labs Lab 05/15/17 0440 05/16/17 0240 05/17/17 0335  NA 142 141 142  K 3.6 3.4* 2.9*  CL 87* 81* 92*  CO2 47* 47* 39*  BUN 47* 58* 92*  CREATININE 0.87 0.96 1.19  GLUCOSE 181* 193* 261*   Electrolytes  Recent Labs Lab 05/15/17 0440 05/16/17 0240 05/17/17 0335  CALCIUM 9.5 10.0 8.8*  MG 3.0* 2.7* 3.2*  PHOS 4.5 4.3 3.6    CBC  Recent Labs Lab 05/15/17 0440 05/16/17 0240 05/17/17 0335  WBC 10.1 26.3* 34.9*  HGB 15.9 17.8* 18.7*  HCT 51.4 54.8* 58.6*  PLT 134* 105* 104*   Coag's No results for input(s): APTT, INR in the last 168 hours.  Sepsis Markers  Recent Labs Lab 05/10/17 1957 05/13/17 1137 05/14/17 0243 05/15/17 0440  LATICACIDVEN 1.70  --   --   --   PROCALCITON  --  <0.10 <0.10 <0.10   ABG  Recent Labs Lab 05/15/17 0355 05/16/17 0510 05/17/17 0350  PHART 7.424 7.450 7.451*  PCO2ART 72.1* 71.2* 53.2*  PO2ART 73.7* 80.1* 81.6*   Liver Enzymes  Recent Labs Lab 05/12/17 0455  AST 12*  ALT 23  ALKPHOS 62  BILITOT 0.5  ALBUMIN 2.8*   Cardiac Enzymes No results for input(s): TROPONINI, PROBNP in the last 168 hours.  Glucose  Recent Labs Lab 05/16/17 0832 05/16/17 1151 05/16/17 1543 05/16/17 2045 05/17/17 0010 05/17/17 0343  GLUCAP 190* 255* 230* 182* 245* 253*    Imaging No results found. STUDIES:  Bronch 8/19- tracheal and left main and RUL collapse thick pus  CULTURES: 8/19 Sputum cx Bronch bal >>MRSA 8/19 bc>>> 8/24 C diff positive  ANTIBIOTICS: 8/19 Vanc >> 8/19 Fortax>>>8/23>>>8/24>>> 8/24 PO Vanc  SIGNIFICANT EVENTS: 8/19 ETT emergent, bronch with collapse  LINES/TUBES: 8/19 ETT >> PIV  DISCUSSION: 51 yo male smoker with Bipolar/substance abuse admitted in COPD exacerbation with progressive decline with hypercarbic respiratory failure requiring intubation /vent support on 8/19  .  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  SVT episode s/p adenosine 1 week prior to admit  HTN  P:  Hold home meds (Lisinopril/HCTZ) Monitor tele Neo 15 mcg for BP support  RENAL hypophos  P:   Replace electrolytes as indicated  F/u chem  KVO IVF Lasix to 40 mg IV q6 x3 doses Acetazolamide 250 mg IV q6 x3 doses KCl replacement ordered  GASTROINTESTINAL A:   NPO  P:   PPI  TF per nutrition  HEMATOLOGIC A:  Secondary polycythemia dvt prevention  P:  Lovenox SQ Active diureses as above  INFECTIOUS A:   COPD exacerbation \hcap C diff positive P:   Follow culture- BAL bronch  Vanc PO and IV Restart ceftaz given fever and elevated WBC overnight F/U BC D/ced azithro HIV negative  ENDOCRINE A:    Hyperglycemia  P:   Add SSI as indicated   NEUROLOGIC A:   Seizure disorder, vent dyscherony P:   RASS goal: -1  Cont seizure regimen  D/Ced propofol PRN versed - home benzo  FAMILY  - Updates: no family available 8/25  - Inter-disciplinary family meet or Palliative Care meeting due by:  05/18/17   The patient is critically ill with multiple organ systems failure and requires high complexity decision making for assessment and support, frequent evaluation and titration of therapies, application of advanced monitoring technologies and extensive interpretation of multiple databases.   Critical Care Time devoted to patient care services described in this note is  35  Minutes. This time reflects time of care of this signee Dr Jennet Maduro. This critical care time does not reflect procedure time, or teaching time or supervisory time of PA/NP/Med student/Med Resident etc but could involve care discussion time.  Rush Farmer, M.D. [facility] Pulmonary/Critical Care Medicine. Pager: [phone]. After hours pager: [phone].

## 2017-05-17 NOTE — Progress Notes (Signed)
eLink Physician-Brief Progress Note Patient Name: Harold Bailey DOB: 01/22/1966 MRN: [id number]   Date of Service  05/17/2017  HPI/Events of Note  Request to renew bilateral soft wrist restraints.   eICU Interventions  Will renew bilateral soft wrist restraints.      Intervention Category Minor Interventions: Agitation / anxiety - evaluation and management  Lysle Dingwall 05/17/2017, 6:31 PM

## 2017-05-17 NOTE — Progress Notes (Signed)
eLink Physician-Brief Progress Note Patient Name: Harold Bailey DOB: Oct 18, 1965 MRN: [id number]   Date of Service  05/17/2017  HPI/Events of Note  hypokalemia  eICU Interventions  Potassium replaced     Intervention Category Intermediate Interventions: Electrolyte abnormality - evaluation and management  [name] 05/17/2017, 4:41 AM

## 2017-05-18 ENCOUNTER — Inpatient Hospital Stay (HOSPITAL_COMMUNITY): Payer: Medicare Other

## 2017-05-18 DIAGNOSIS — A0472 Enterocolitis due to Clostridium difficile, not specified as recurrent: Secondary | ICD-10-CM

## 2017-05-18 LAB — BLOOD GAS, ARTERIAL
ACID-BASE EXCESS: 9.1 mmol/L — AB (ref 0.0–2.0)
Bicarbonate: 33.5 mmol/L — ABNORMAL HIGH (ref 20.0–28.0)
DRAWN BY: 236041
FIO2: 50
O2 SAT: 93.5 %
PATIENT TEMPERATURE: 100.7
PCO2 ART: 52.4 mmHg — AB (ref 32.0–48.0)
PEEP/CPAP: 5 cmH2O
PH ART: 7.428 (ref 7.350–7.450)
RATE: 22 resp/min
VT: 620 mL
pO2, Arterial: 72.2 mmHg — ABNORMAL LOW (ref 83.0–108.0)

## 2017-05-18 LAB — CBC
HEMATOCRIT: 58.2 % — AB (ref 39.0–52.0)
Hemoglobin: 18.6 g/dL — ABNORMAL HIGH (ref 13.0–17.0)
MCH: 34.2 pg — ABNORMAL HIGH (ref 26.0–34.0)
MCHC: 32 g/dL (ref 30.0–36.0)
MCV: 107 fL — AB (ref 78.0–100.0)
Platelets: 110 10*3/uL — ABNORMAL LOW (ref 150–400)
RBC: 5.44 MIL/uL (ref 4.22–5.81)
RDW: 13.7 % (ref 11.5–15.5)
WBC: 23.6 10*3/uL — ABNORMAL HIGH (ref 4.0–10.5)

## 2017-05-18 LAB — GLUCOSE, CAPILLARY
GLUCOSE-CAPILLARY: 149 mg/dL — AB (ref 65–99)
GLUCOSE-CAPILLARY: 156 mg/dL — AB (ref 65–99)
Glucose-Capillary: 186 mg/dL — ABNORMAL HIGH (ref 65–99)
Glucose-Capillary: 173 mg/dL — ABNORMAL HIGH (ref 65–99)
Glucose-Capillary: 171 mg/dL — ABNORMAL HIGH (ref 65–99)
Glucose-Capillary: 145 mg/dL — ABNORMAL HIGH (ref 65–99)

## 2017-05-18 LAB — BASIC METABOLIC PANEL
Anion gap: 14 (ref 5–15)
BUN: 72 mg/dL — ABNORMAL HIGH (ref 6–20)
CALCIUM: 9.6 mg/dL (ref 8.9–10.3)
CHLORIDE: 101 mmol/L (ref 101–111)
CO2: 36 mmol/L — ABNORMAL HIGH (ref 22–32)
CREATININE: 1.05 mg/dL (ref 0.61–1.24)
GFR calc Af Amer: >60 mL/min (ref >60)
GFR calc non Af Amer: >60 mL/min (ref >60)
GLUCOSE: 159 mg/dL — AB (ref 65–99)
Potassium: 3.6 mmol/L (ref 3.5–5.1)
Sodium: 151 mmol/L — ABNORMAL HIGH (ref 135–145)

## 2017-05-18 LAB — MAGNESIUM: Magnesium: 2.9 mg/dL — ABNORMAL HIGH (ref 1.7–2.4)

## 2017-05-18 LAB — PHOSPHORUS: Phosphorus: 2.8 mg/dL (ref 2.5–4.6)

## 2017-05-18 MED ORDER — POTASSIUM PHOSPHATES 15 MMOLE/5ML IV SOLN
30.0000 mmol | Freq: Once | INTRAVENOUS | Status: AC
  Administered 2017-05-18: 30 mmol via INTRAVENOUS
  Filled 2017-05-18: qty 10

## 2017-05-18 MED ORDER — FUROSEMIDE 10 MG/ML IJ SOLN
40.0000 mg | Freq: Four times a day (QID) | INTRAMUSCULAR | Status: AC
  Administered 2017-05-18 – 2017-05-19 (×3): 40 mg via INTRAVENOUS
  Filled 2017-05-18 (×3): qty 4

## 2017-05-18 MED ORDER — FREE WATER: 250.0000 mL | Freq: Four times a day (QID) | Status: DC

## 2017-05-18 MED ORDER — ACETAZOLAMIDE SODIUM 500 MG IJ SOLR
250.0000 mg | Freq: Four times a day (QID) | INTRAMUSCULAR | Status: AC
  Administered 2017-05-18 – 2017-05-19 (×3): 250 mg via INTRAVENOUS
  Filled 2017-05-18 (×3): qty 250

## 2017-05-18 NOTE — Progress Notes (Signed)
PULMONARY / CRITICAL CARE MEDICINE   Name: Harold Bailey MRN: [id number] DOB: 1965/11/24    ADMISSION DATE:  05/10/2017 CONSULTATION DATE:  05/11/17  REFERRING MD:  Dr. Loleta Books  CHIEF COMPLAINT:  Resp Distress   HISTORY OF PRESENT ILLNESS:   51 yo male smoker with hx of COPD/Asthma , Seizure disorder, SVT , Bipolar, substance abuse with recent admission to outlying hospital with chest pain. He had SVT, cardioverted with Adenosine . LHC showed nonobstructive CAD. He developed bronchitic symptoms during this admission and was treated with prednisone and omnicef. CXR and CT chest were reported as neg for PNA. 2 days prior to admission he developed progressively worsening sob, cough and wheezing . He was brought to ER on 8/18 and admitted by [facility] . He was started on solumedrol, nebs, and abx. LA was normal . CXR showed on pna.  Early am on 819 with progressive decline with hypoxia, increased wob and lethargy . PCCM asked to consult. Pt was moved to ICU and intubated. Bedside bronchoscopy showed very thick airway secretions. Airway .    SUBJECTIVE:  Diarrhea and c diff positive No events overnight  VITAL SIGNS: BP 109/71   Pulse 73   Temp (!) 100.7 F (38.2 C) (Rectal)   Resp (!) 22   Ht 6' (1.829 m)   Wt 103.9 kg (229 lb 0.9 oz)   SpO2 92%   BMI 31.07 kg/m   HEMODYNAMICS: CVP:  [7 mmHg-8 mmHg] 8 mmHg  VENTILATOR SETTINGS: Vent Mode: PRVC FiO2 (%):  [40 %-50 %] 40 % Set Rate:  [22 bmp] 22 bmp Vt Set:  [502 mL] 620 mL PEEP:  [5 cmH20-8 cmH20] 5 cmH20 Plateau Pressure:  [14 cmH20-16 cmH20] 14 cmH20  INTAKE / OUTPUT: I/O last 3 completed shifts: In: 3812.9 [I.V.:1872.9; NG/GT:1290; IV Piggyback:650] Out: 4600 [Urine:3900; Stool:700]  PHYSICAL EXAMINATION: General:  Obese male sedated on vent, NAD. Neuro: Sedated, RASS of 0. HEENT:  ETT in place, [place]/AT, PERRL. Cardiovascular:  RRR, Nl S1/S2, -M/R/G. Lungs: decreased aeration , +wheezing throughout ,  Poor air movement   Abdomen:  Obese, NT, ND and BS+. Musculoskeletal:  -edema and -tenderness Skin:  No rash, intact  LABS:  BMET  Recent Labs Lab 05/16/17 0240 05/17/17 0335 05/18/17 0504  NA 141 142 151*  K 3.4* 2.9* 3.6  CL 81* 92* 101  CO2 47* 39* 36*  BUN 58* 92* 72*  CREATININE 0.96 1.19 1.05  GLUCOSE 193* 261* 159*   Electrolytes  Recent Labs Lab 05/16/17 0240 05/17/17 0335 05/18/17 0504  CALCIUM 10.0 8.8* 9.6  MG 2.7* 3.2* 2.9*  PHOS 4.3 3.6 2.8   CBC  Recent Labs Lab 05/16/17 0240 05/17/17 0335 05/18/17 0504  WBC 26.3* 34.9* 23.6*  HGB 17.8* 18.7* 18.6*  HCT 54.8* 58.6* 58.2*  PLT 105* 104* 110*   Coag's No results for input(s): APTT, INR in the last 168 hours.  Sepsis Markers  Recent Labs Lab 05/13/17 1137 05/14/17 0243 05/15/17 0440  PROCALCITON <0.10 <0.10 <0.10   ABG  Recent Labs Lab 05/16/17 0510 05/17/17 0350 05/18/17 0345  PHART 7.450 7.451* 7.428  PCO2ART 71.2* 53.2* 52.4*  PO2ART 80.1* 81.6* 72.2*   Liver Enzymes  Recent Labs Lab 05/12/17 0455  AST 12*  ALT 23  ALKPHOS 62  BILITOT 0.5  ALBUMIN 2.8*   Cardiac Enzymes No results for input(s): TROPONINI, PROBNP in the last 168 hours.  Glucose  Recent Labs Lab 05/17/17 1205 05/17/17 1554 05/17/17 1956 05/18/17 0002 05/18/17 0348  05/18/17 0832  GLUCAP 272* 209* 146* 181* 186* 156*   Imaging Dg Chest Port 1 View  Result Date: 05/18/2017 CLINICAL DATA:  CLINICAL DATA ETT EXAM: PORTABLE CHEST 1 VIEW COMPARISON:  None. FINDINGS: There is artifact from the patient's bed or blanket. Normal cardiomediastinal silhouette. Support tubes and lines are stable. Basilar opacities representing atelectasis and probable small effusions are noted. IMPRESSION: Slight worsening aeration.  Bibasilar subsegmental atelectasis. Electronically Signed   By: Staci Righter M.D.   On: 05/18/2017 07:16   Dg Chest Port 1 View  Result Date: 05/17/2017 CLINICAL DATA:  Ventilator dependent respiratory  failure. Patient pulled out ET tube. Re-intubation. EXAM: PORTABLE CHEST 1 VIEW COMPARISON:  05/17/2017 FINDINGS: Endotracheal tube, nasogastric tube, and left arm PICC line normal seen in appropriate position. No pneumothorax visualized. Mild elevation of right hemidiaphragm is again noted. Atelectasis or infiltrate in the medial right lung base shows no significant change. IMPRESSION: Endotracheal tube in appropriate position . Stable atelectasis or infiltrate in medial right lung base. Electronically Signed   By: Earle Gell M.D.   On: 05/17/2017 14:34   STUDIES:  Bronch 8/19- tracheal and left main and RUL collapse thick pus  CULTURES: 8/19 Sputum cx Bronch bal >>MRSA 8/19 bc>>> 8/24 C diff positive  ANTIBIOTICS: 8/19 Vanc >> 8/19 Fortax>>>8/23>>>8/24>>> 8/24 PO Vanc  SIGNIFICANT EVENTS: 8/19 ETT emergent, bronch with collapse  LINES/TUBES: 8/19 ETT >> PIV  DISCUSSION: 52 yo male smoker with Bipolar/substance abuse admitted in COPD exacerbation with progressive decline with hypercarbic respiratory failure requiring intubation /vent support on 8/19 .  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  SVT episode s/p adenosine 1 week prior to admit  HTN  P:  Hold home meds (Lisinopril/HCTZ) Monitor tele Neo 15 mcg for BP support, hope is that with decrease in sedation will no longer need pressors  RENAL hypophos  P:   Replace electrolytes as indicated  F/u chem  KVO IVF Lasix to 40 mg IV q6 x3 doses Acetazolamide 250 mg IV q6 x3 doses KCl replacement  ordered  GASTROINTESTINAL A:   NPO  P:   PPI  TF per nutrition  HEMATOLOGIC A:  Secondary polycythemia dvt prevention  P:  Lovenox SQ Active diureses as above  INFECTIOUS A:   COPD exacerbation \hcap C diff positive P:   Follow culture- BAL bronch  Vanc PO and IV Restarted ceftaz given fever and elevated WBC overnight 8/24 F/U BC D/ced azithro HIV negative  ENDOCRINE A:    Hyperglycemia  P:   Add SSI as indicated   NEUROLOGIC A:   Seizure disorder, vent dyscherony P:   RASS goal: -1  Cont seizure regimen  D/Ced propofol PRN versed - home benzo  FAMILY  - Updates: no family available 8/26  - Inter-disciplinary family meet or Palliative Care meeting due by:  05/18/17   The patient is critically ill with multiple organ systems failure and requires high complexity decision making for assessment and support, frequent evaluation and titration of therapies, application of advanced monitoring technologies and extensive interpretation of multiple databases.   Critical Care  Time devoted to patient care services described in this note is  35  Minutes. This time reflects time of care of this signee Dr Jennet Maduro. This critical care time does not reflect procedure time, or teaching time or supervisory time of PA/NP/Med student/Med Resident etc but could involve care discussion time.  Rush Farmer, M.D. [facility] Pulmonary/Critical Care Medicine. Pager: [phone]. After hours pager: [phone].

## 2017-05-18 NOTE — Progress Notes (Signed)
Patient placed back on full support due to extreme agitation/confusion. Currently on PRVC 620 22 40% +5 at this time.

## 2017-05-18 NOTE — Progress Notes (Signed)
eLink Physician-Brief Progress Note Patient Name: Harold Bailey DOB: 07/20/1966 MRN: [id number]   Date of Service  05/18/2017  HPI/Events of Note  Agitation - Request to renew restraint orders.    eICU Interventions  Will renew restraint orders.      Intervention Category Minor Interventions: Agitation / anxiety - evaluation and management  Corday Wyka Cornelia Copa 05/18/2017, 7:16 PM

## 2017-05-19 ENCOUNTER — Inpatient Hospital Stay (HOSPITAL_COMMUNITY): Payer: Medicare Other

## 2017-05-19 ENCOUNTER — Other Ambulatory Visit: Payer: Medicare Other

## 2017-05-19 LAB — GLUCOSE, CAPILLARY
GLUCOSE-CAPILLARY: 141 mg/dL — AB (ref 65–99)
Glucose-Capillary: 154 mg/dL — ABNORMAL HIGH (ref 65–99)
Glucose-Capillary: 175 mg/dL — ABNORMAL HIGH (ref 65–99)
Glucose-Capillary: 143 mg/dL — ABNORMAL HIGH (ref 65–99)
Glucose-Capillary: 126 mg/dL — ABNORMAL HIGH (ref 65–99)

## 2017-05-19 LAB — CBC
HCT: 56.6 % — ABNORMAL HIGH (ref 39.0–52.0)
Hemoglobin: 17 g/dL (ref 13.0–17.0)
MCH: 32.1 pg (ref 26.0–34.0)
MCHC: 30 g/dL (ref 30.0–36.0)
MCV: 106.8 fL — ABNORMAL HIGH (ref 78.0–100.0)
PLATELETS: 99 10*3/uL — AB (ref 150–400)
RBC: 5.3 MIL/uL (ref 4.22–5.81)
RDW: 14.4 % (ref 11.5–15.5)
WBC: 11.5 10*3/uL — ABNORMAL HIGH (ref 4.0–10.5)

## 2017-05-19 LAB — BASIC METABOLIC PANEL
Anion gap: 9 (ref 5–15)
BUN: 45 mg/dL — AB (ref 6–20)
CALCIUM: 8.5 mg/dL — AB (ref 8.9–10.3)
CO2: 32 mmol/L (ref 22–32)
CREATININE: 0.8 mg/dL (ref 0.61–1.24)
Chloride: 110 mmol/L (ref 101–111)
GFR calc Af Amer: >60 mL/min (ref >60)
Glucose, Bld: 173 mg/dL — ABNORMAL HIGH (ref 65–99)
Potassium: 3.3 mmol/L — ABNORMAL LOW (ref 3.5–5.1)
SODIUM: 151 mmol/L — AB (ref 135–145)

## 2017-05-19 LAB — MAGNESIUM: MAGNESIUM: 2.5 mg/dL — AB (ref 1.7–2.4)

## 2017-05-19 LAB — PHOSPHORUS: Phosphorus: 3.1 mg/dL (ref 2.5–4.6)

## 2017-05-19 MED ORDER — METHYLPREDNISOLONE SODIUM SUCC 40 MG IJ SOLR
40.0000 mg | INTRAMUSCULAR | Status: DC
  Administered 2017-05-20: 40 mg via INTRAVENOUS
  Filled 2017-05-19: qty 1

## 2017-05-19 MED ORDER — POTASSIUM CHLORIDE 20 MEQ/15ML (10%) PO SOLN
40.0000 meq | Freq: Three times a day (TID) | ORAL | Status: AC
  Administered 2017-05-19 (×2): 40 meq
  Filled 2017-05-19 (×2): qty 30

## 2017-05-19 MED ORDER — VITAL HIGH PROTEIN PO LIQD
1000.0000 mL | ORAL | Status: DC
  Administered 2017-05-19: 1000 mL
  Filled 2017-05-19: qty 1000

## 2017-05-19 MED ORDER — POTASSIUM CHLORIDE 20 MEQ/15ML (10%) PO SOLN
40.0000 meq | Freq: Once | ORAL | Status: AC
  Administered 2017-05-19: 40 meq
  Filled 2017-05-19: qty 30

## 2017-05-19 MED ORDER — FUROSEMIDE 10 MG/ML IJ SOLN
40.0000 mg | Freq: Four times a day (QID) | INTRAMUSCULAR | Status: AC
  Administered 2017-05-19 – 2017-05-20 (×3): 40 mg via INTRAVENOUS
  Filled 2017-05-19 (×3): qty 4

## 2017-05-19 MED ORDER — PRO-STAT SUGAR FREE PO LIQD
60.0000 mL | Freq: Two times a day (BID) | ORAL | Status: DC
  Administered 2017-05-19: 60 mL
  Filled 2017-05-19 (×2): qty 60

## 2017-05-19 NOTE — Progress Notes (Signed)
Nutrition Follow-up  DOCUMENTATION CODES:   Obesity unspecified  INTERVENTION:  - Increase TF of Vital High Protein to 50 mL/hr (1200 mL daily) - 60 mL Pro-Stat BID -Tube feeding + Pro-Stat regimen provides 1600 kcal, 165 grams of protein, 1008 ml of H2O.  - Increased TF + free water flushes providing total of 2508 mL H2O daily - Pt remains hypernatremic, if does not trend down s/p increasing TF rate recommend increasing free water flushes   NUTRITION DIAGNOSIS:   Inadequate oral intake related to acute illness as evidenced by NPO status.  Ongoing   GOAL:   Provide needs based on ASPEN/SCCM guidelines  Met via TF regimen   MONITOR:   Vent status, Labs, Weight trends, TF tolerance  REASON FOR ASSESSMENT:   Consult, Ventilator Enteral/tube feeding initiation and management  ASSESSMENT:   51 yo male admitted with dyspnea with COPD exacerbation, pneumonia. Pt with hx of anxiety, COPD, seizures, substance abuse, bipolar disorder  Discussed pt with RN. Per RN pt tolerating TF. Per RN propofol has been off since last week. Patient is currently intubated on ventilator support Pt having loose stools, C. Diff positive  MV: 14.3 L/min Temp (24hrs), Avg:99 F (37.2 C), Min:97.4 F (36.3 C), Max:99.5 F (37.5 C) Pt is -6 L since admission Per MD note PS trials with hopeful extubation in AM  Labs reviewed; CBG 143-186, Na 151, K 3.3, Magnesium 2.5 Medications reviewed; Pepcid, Sliding scale insulin, Lantus, Solu-medrol  Diet Order:  Diet NPO time specified  Skin:  Reviewed, no issues  Last BM:  05/19/17  Height:   Ht Readings from Last 1 Encounters:  05/10/17 6' (1.829 m)    Weight:   Wt Readings from Last 1 Encounters:  05/19/17 229 lb 4.5 oz (104 kg)    Ideal Body Weight:  80.9 kg  BMI:  Body mass index is 31.1 kg/m.  Estimated Nutritional Needs:   Kcal:  7591-6384 kcals or 1559 kcals (70% [employer])  Protein:  162-178 g  Fluid:  >/= 2  L  EDUCATION NEEDS:   No education needs identified at this time  Parks Ranger, RDN 05/19/2017 1:08 PM

## 2017-05-19 NOTE — Progress Notes (Signed)
Pharmacy Antibiotic Note  Harold Bailey is a 51 y.o. male admitted on 05/10/2017 with shortness of breath and pneumonia. The patient has positive respiratory cultures for MRSA pneumonia and has been on vancomycin since 05/11/2017. The patient was restarted on ceftazidime on 05/16/2017, despite the positive cultures as there was an increase in WBC overnight and the patient became febrile. The patient was also tested for c.diff on 05/16/2017 and cultures are positive. Will continue ceftazidime, vancomycin IV, and vancomycin PO per the plan below. The renal function is stable, WBC count is trending down, and patient is afebrile at this time.    Plan: -Continue Vancomycin 1564m IV q12 hours  -target trough: 15-20  -Continue Ceftazidime 2g IV q8 hours   -Continue Vancomycin 1285mPO q6 hours -Monitor renal fx, cultures, vancomycin levels as indicated   Height: 6' (182.9 cm) Weight: 229 lb 4.5 oz (104 kg) IBW/kg (Calculated) : 77.6  Temp (24hrs), Avg:99 F (37.2 C), Min:97.4 F (36.3 C), Max:99.5 F (37.5 C)   Recent Labs Lab 05/13/17 1137  05/15/17 0440 05/16/17 0240 05/17/17 0335 05/18/17 0504 05/19/17 0415  WBC  --   < > 10.1 26.3* 34.9* 23.6* 11.5*  CREATININE  --   < > 0.87 0.96 1.19 1.05 0.80  VANCOTROUGH 7*  --   --  18  --   --   --   < > = values in this interval not displayed.  Estimated Creatinine Clearance: 134.8 mL/min (by C-G formula based on SCr of 0.8 mg/dL).    Allergies  Allergen Reactions  . Hydrocodone Rash  . No Known Allergies     Antimicrobials this admission: 8/19 ceftriaxone x1 8/19 azithromycin >>8/20 8/19 ceftazidime >>8/23, 8/24>> 8/19 vancomycin IV >> 05/16/2017 vancomycin PO>>   Microbiology results: 8/19 mrsa pcr: positive  8/19 BAL: MRSA  8/24 C diff antigen/toxin: positive   ShJalene MulletPharm.D. PGY1 Pharmacy Resident 05/19/2017 12:26 PM Main Pharmacy: [phone]

## 2017-05-19 NOTE — Progress Notes (Signed)
PULMONARY / CRITICAL CARE MEDICINE   Name: Harold Bailey MRN: [id number] DOB: April 24, 1966    ADMISSION DATE:  05/10/2017 CONSULTATION DATE:  05/11/17  REFERRING MD:  Dr. Loleta Books  CHIEF COMPLAINT:  Resp Distress   HISTORY OF PRESENT ILLNESS:   51 yo male smoker with hx of COPD/Asthma , Seizure disorder, SVT , Bipolar, substance abuse with recent admission to outlying hospital with chest pain. He had SVT, cardioverted with Adenosine . LHC showed nonobstructive CAD. He developed bronchitic symptoms during this admission and was treated with prednisone and omnicef. CXR and CT chest were reported as neg for PNA. 2 days prior to admission he developed progressively worsening sob, cough and wheezing . He was brought to ER on 8/18 and admitted by [facility] . He was started on solumedrol, nebs, and abx. LA was normal . CXR showed on pna.  Early am on 819 with progressive decline with hypoxia, increased wob and lethargy . PCCM asked to consult. Pt was moved to ICU and intubated. Bedside bronchoscopy showed very thick airway secretions. Airway .    SUBJECTIVE:  Diarrhea and c diff positive Became very agitated during a weaning trial this AM  VITAL SIGNS: BP (!) 84/72   Pulse (!) 101   Temp 99.5 F (37.5 C) (Rectal)   Resp (!) 22   Ht 6' (1.829 m)   Wt 104 kg (229 lb 4.5 oz)   SpO2 (!) 88%   BMI 31.10 kg/m   HEMODYNAMICS: CVP:  [3 mmHg-8 mmHg] 3 mmHg  VENTILATOR SETTINGS: Vent Mode: PRVC FiO2 (%):  [40 %] 40 % Set Rate:  [22 bmp] 22 bmp Vt Set:  [826 mL] 620 mL PEEP:  [5 cmH20] 5 cmH20 Pressure Support:  [5 cmH20] 5 cmH20 Plateau Pressure:  [12 cmH20-15 cmH20] 15 cmH20  INTAKE / OUTPUT: I/O last 3 completed shifts: In: 5168.3 [I.V.:2488.3; NG/GT:480; IV Piggyback:2200] Out: 5550 [Urine:4700; Stool:850]  PHYSICAL EXAMINATION: General:  Obese male sedated on vent, NAD. Neuro: Sedated, RASS of 0. HEENT:  ETT in place, [place]/AT, PERRL. Cardiovascular:  RRR, Nl S1/S2, -M/R/G. Lungs:  decreased aeration , +wheezing throughout ,  Poor air movement  Abdomen:  Obese, NT, ND and BS+. Musculoskeletal:  -edema and -tenderness Skin:  No rash, intact  LABS:  BMET  Recent Labs Lab 05/17/17 0335 05/18/17 0504 05/19/17 0415  NA 142 151* 151*  K 2.9* 3.6 3.3*  CL 92* 101 110  CO2 39* 36* 32  BUN 92* 72* 45*  CREATININE 1.19 1.05 0.80  GLUCOSE 261* 159* 173*   Electrolytes  Recent Labs Lab 05/17/17 0335 05/18/17 0504 05/19/17 0415  CALCIUM 8.8* 9.6 8.5*  MG 3.2* 2.9* 2.5*  PHOS 3.6 2.8 3.1   CBC  Recent Labs Lab 05/17/17 0335 05/18/17 0504 05/19/17 0415  WBC 34.9* 23.6* 11.5*  HGB 18.7* 18.6* 17.0  HCT 58.6* 58.2* 56.6*  PLT 104* 110* 99*   Coag's No results for input(s): APTT, INR in the last 168 hours.  Sepsis Markers  Recent Labs Lab 05/13/17 1137 05/14/17 0243 05/15/17 0440  PROCALCITON <0.10 <0.10 <0.10   ABG  Recent Labs Lab 05/16/17 0510 05/17/17 0350 05/18/17 0345  PHART 7.450 7.451* 7.428  PCO2ART 71.2* 53.2* 52.4*  PO2ART 80.1* 81.6* 72.2*   Liver Enzymes No results for input(s): AST, ALT, ALKPHOS, BILITOT, ALBUMIN in the last 168 hours. Cardiac Enzymes No results for input(s): TROPONINI, PROBNP in the last 168 hours.  Glucose  Recent Labs Lab 05/18/17 1225 05/18/17 1619 05/18/17 2057  05/18/17 2356 05/19/17 0346 05/19/17 0824  GLUCAP 173* 171* 145* 149* 154* 175*   Imaging Dg Chest Port 1 View  Result Date: 05/19/2017 CLINICAL DATA:  Intubation. EXAM: PORTABLE CHEST 1 VIEW COMPARISON:  05/18/2017. FINDINGS: Endotracheal tube, NG tube, left PICC line in stable position. Progressive right base atelectasis. Right base infiltrate cannot be excluded. Persistent mild left base subsegmental atelectasis. Small right pleural effusion. No pneumothorax. IMPRESSION: 1. Lines and tubes in stable position. 2. Progressive right base atelectasis. Right base infiltrate cannot be excluded. Small right pleural effusion. 3.  Persistent mild left base subsegmental atelectasis . Electronically Signed   By: Marcello Moores  Register   On: 05/19/2017 06:24   STUDIES:  Bronch 8/19- tracheal and left main and RUL collapse thick pus  CULTURES: 8/19 Sputum cx Bronch bal >>MRSA 8/19 bc>>> 8/24 C diff positive  ANTIBIOTICS: 8/19 Vanc >> 8/19 Fortax>>>8/23>>>8/24>>> 8/24 PO Vanc  SIGNIFICANT EVENTS: 8/19 ETT emergent, bronch with collapse  LINES/TUBES: 8/19 ETT >> PIV  DISCUSSION: 51 yo male smoker with Bipolar/substance abuse admitted in COPD exacerbation with progressive decline with hypercarbic respiratory failure requiring intubation /vent support on 8/19 .  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  SVT episode s/p adenosine 1 week prior to admit  HTN  P:  Hold home meds (Lisinopril/HCTZ) Monitor tele Off neo but keep it hanging given diureses  RENAL hypophos  P:   Replace electrolytes as indicated  F/u chem  KVO IVF Lasix to 40 mg IV q6 x3 doses KCl replacement ordered  GASTROINTESTINAL A:   NPO  P:   PPI  TF per nutrition  HEMATOLOGIC A:  Secondary polycythemia dvt prevention  P:  Lovenox SQ Active diureses as above  INFECTIOUS A:   COPD exacerbation \hcap C diff positive P:   Follow culture- BAL bronch  Vanc PO and IV Restarted ceftaz given fever and elevated WBC overnight 8/24 F/U BC D/ced azithro HIV negative  ENDOCRINE A:    Hyperglycemia  P:   Add SSI as indicated   NEUROLOGIC A:   Seizure disorder, vent dyscherony P:    RASS goal: -1  Cont seizure regimen  D/Ced propofol PRN versed - home benzo  FAMILY  - Updates: Wife updated bedside 8/27  - Inter-disciplinary family meet or Palliative Care meeting due by:  05/18/17   The patient is critically ill with multiple organ systems failure and requires high complexity decision making for assessment and support, frequent evaluation and titration of therapies, application of advanced monitoring technologies and extensive interpretation of multiple databases.   Critical Care Time devoted to patient care services described in this note is  35  Minutes. This time reflects time of care of this signee Dr Jennet Maduro. This critical care time does not reflect procedure time, or teaching time or supervisory time of PA/NP/Med student/Med Resident etc but could involve care discussion time.  Rush Farmer, M.D. [facility] Pulmonary/Critical Care Medicine. Pager: [phone]. After hours pager: [phone].

## 2017-05-19 NOTE — Progress Notes (Signed)
   [facility] Physician Progress Note and Electrolyte Replacement  Patient Name: Harold Bailey DOB: 31-Dec-1965 MRN: [id number]  Date of Service  05/19/2017   HPI/Events of Note    Recent Labs Lab 05/15/17 0440 05/16/17 0240 05/17/17 0335 05/18/17 0504 05/19/17 0415  NA 142 141 142 151* 151*  K 3.6 3.4* 2.9* 3.6 3.3*  CL 87* 81* 92* 101 110  CO2 47* 47* 39* 36* 32  GLUCOSE 181* 193* 261* 159* 173*  BUN 47* 58* 92* 72* 45*  CREATININE 0.87 0.96 1.19 1.05 0.80  CALCIUM 9.5 10.0 8.8* 9.6 8.5*  MG 3.0* 2.7* 3.2* 2.9* 2.5*  PHOS 4.5 4.3 3.6 2.8 3.1    Estimated Creatinine Clearance: 134.8 mL/min (by C-G formula based on SCr of 0.8 mg/dL).  Intake/Output      08/26 0701 - 08/27 0700   I.V. (mL/kg) 1019 (9.8)   NG/GT 220   IV Piggyback 150   Total Intake(mL/kg) 1389 (13.4)   Urine (mL/kg/hr) 1950 (0.8)   Stool 550   Total Output 2500   Net -1111        - I/O DETAILED x 24h    Total I/O In: 168.8 [I.V.:168.8] Out: 425 [Urine:225; Stool:200] - I/O THIS SHIFT    ASSESSMENT k 3.3 with good ur op  eICURN Interventions  Replete 58meq kcl via tuibe   ASSESSMENT: MAJOR ELECTROLYTE      Dr. Brand Males, M.D., [facility].C.P Pulmonary and Critical Care Medicine Staff Physician [place] [facility] and Critical Care Pager: [phone], If no answer or between  15:00h - 7:00h: call [phone]  05/19/2017 5:41 AM

## 2017-05-19 NOTE — Progress Notes (Signed)
eLink Physician-Brief Progress Note Patient Name: Harold Bailey DOB: 1965/12/23 MRN: [id number]   Date of Service  05/19/2017  HPI/Events of Note  Pulling at tubes/lines  eICU Interventions  Renew restraints     Intervention Category Major Interventions: Change in mental status - evaluation and management  Simonne Maffucci 05/19/2017, 6:06 PM

## 2017-05-20 ENCOUNTER — Inpatient Hospital Stay (HOSPITAL_COMMUNITY): Payer: Medicare Other

## 2017-05-20 DIAGNOSIS — R451 Restlessness and agitation: Secondary | ICD-10-CM

## 2017-05-20 LAB — POCT I-STAT 3, ART BLOOD GAS (G3+)
Acid-Base Excess: 8 mmol/L — ABNORMAL HIGH (ref 0.0–2.0)
Bicarbonate: 33.3 mmol/L — ABNORMAL HIGH (ref 20.0–28.0)
O2 SAT: 91 %
PCO2 ART: 45.1 mmHg (ref 32.0–48.0)
PO2 ART: 58 mmHg — AB (ref 83.0–108.0)
Patient temperature: 98.4
TCO2: 35 mmol/L — AB (ref 22–32)
pH, Arterial: 7.476 — ABNORMAL HIGH (ref 7.350–7.450)

## 2017-05-20 LAB — CBC
HCT: 49.1 % (ref 39.0–52.0)
Hemoglobin: 15 g/dL (ref 13.0–17.0)
MCH: 33 pg (ref 26.0–34.0)
MCHC: 30.5 g/dL (ref 30.0–36.0)
MCV: 108.1 fL — ABNORMAL HIGH (ref 78.0–100.0)
PLATELETS: 88 10*3/uL — AB (ref 150–400)
RBC: 4.54 MIL/uL (ref 4.22–5.81)
RDW: 14.2 % (ref 11.5–15.5)
WBC: 11.1 10*3/uL — ABNORMAL HIGH (ref 4.0–10.5)

## 2017-05-20 LAB — BASIC METABOLIC PANEL
Anion gap: 7 (ref 5–15)
BUN: 36 mg/dL — AB (ref 6–20)
CALCIUM: 7.5 mg/dL — AB (ref 8.9–10.3)
CO2: 30 mmol/L (ref 22–32)
CREATININE: 0.69 mg/dL (ref 0.61–1.24)
Chloride: 115 mmol/L — ABNORMAL HIGH (ref 101–111)
GFR calc non Af Amer: >60 mL/min (ref >60)
Glucose, Bld: 187 mg/dL — ABNORMAL HIGH (ref 65–99)
Potassium: 2.7 mmol/L — CL (ref 3.5–5.1)
SODIUM: 152 mmol/L — AB (ref 135–145)

## 2017-05-20 LAB — GLUCOSE, CAPILLARY
GLUCOSE-CAPILLARY: 167 mg/dL — AB (ref 65–99)
GLUCOSE-CAPILLARY: 118 mg/dL — AB (ref 65–99)
GLUCOSE-CAPILLARY: 115 mg/dL — AB (ref 65–99)
Glucose-Capillary: 206 mg/dL — ABNORMAL HIGH (ref 65–99)
Glucose-Capillary: 85 mg/dL (ref 65–99)
Glucose-Capillary: 92 mg/dL (ref 65–99)

## 2017-05-20 LAB — PHOSPHORUS: PHOSPHORUS: 1.8 mg/dL — AB (ref 2.5–4.6)

## 2017-05-20 LAB — MAGNESIUM: MAGNESIUM: 2 mg/dL (ref 1.7–2.4)

## 2017-05-20 MED ORDER — DEXTROSE 5 % IV SOLN
30.0000 mmol | Freq: Once | INTRAVENOUS | Status: AC
  Administered 2017-05-20: 30 mmol via INTRAVENOUS
  Filled 2017-05-20: qty 10

## 2017-05-20 MED ORDER — POTASSIUM CHLORIDE 10 MEQ/50ML IV SOLN
10.0000 meq | INTRAVENOUS | Status: DC
  Administered 2017-05-20 (×3): 10 meq via INTRAVENOUS
  Filled 2017-05-20 (×5): qty 50

## 2017-05-20 MED ORDER — VALPROATE SODIUM 500 MG/5ML IV SOLN
500.0000 mg | Freq: Two times a day (BID) | INTRAVENOUS | Status: DC
  Administered 2017-05-20 – 2017-05-27 (×14): 500 mg via INTRAVENOUS
  Filled 2017-05-20 (×16): qty 5

## 2017-05-20 MED ORDER — METHYLPREDNISOLONE SODIUM SUCC 40 MG IJ SOLR
20.0000 mg | INTRAMUSCULAR | Status: DC
  Administered 2017-05-21: 20 mg via INTRAVENOUS
  Filled 2017-05-20 (×2): qty 0.5

## 2017-05-20 MED ORDER — ORAL CARE MOUTH RINSE
15.0000 mL | Freq: Two times a day (BID) | OROMUCOSAL | Status: DC
  Administered 2017-05-20 – 2017-05-21 (×2): 15 mL via OROMUCOSAL

## 2017-05-20 MED ORDER — SODIUM CHLORIDE 0.9 % IV SOLN
100.0000 mg | Freq: Two times a day (BID) | INTRAVENOUS | Status: DC
  Administered 2017-05-20 – 2017-05-26 (×14): 100 mg via INTRAVENOUS
  Filled 2017-05-20 (×23): qty 10

## 2017-05-20 NOTE — Procedures (Signed)
Extubation Procedure Note  Patient Details:   Name: Harold Bailey DOB: October 23, 1965 MRN: [id number]   Airway Documentation:     Evaluation  O2 sats: stable throughout Complications: No apparent complications Patient did tolerate procedure well. Bilateral Breath Sounds: Expiratory wheezes, Diminished   Yes   Patient tolerated wean. Positive for cuff leak. MD ordered to extubate. Patient extubated to a 6 Lpm nasal cannula. No signs of dyspnea or stridor. Patient resting comfortably. RN at bedside.  Myrtie Neither 05/20/2017, 10:47 AM

## 2017-05-20 NOTE — Progress Notes (Signed)
[facility] ADULT ICU REPLACEMENT PROTOCOL FOR AM LAB REPLACEMENT ONLY  The patient does apply for the [facility] Adult ICU Electrolyte Replacment Protocol based on the criteria listed below:   1. Is GFR >/= 40 ml/min? Yes.    Patient's GFR today is >60 2. Is urine output >/= 0.5 ml/kg/hr for the last 6 hours? Yes.   Patient's UOP is 2.7 ml/kg/hr 3. Is BUN < 60 mg/dL? Yes.    Patient's BUN today is 36 4. Abnormal electrolyte(s): K=2.7 5. Ordered repletion with: 19meq 6. If a panic level lab has been reported, has the CCM MD in charge been notified? Yes.  .   Physician:  Chase Caller, MD  Kathyrn Drown Dollar 05/20/2017 5:59 AM

## 2017-05-20 NOTE — Progress Notes (Signed)
Wasted 200 mls of fentanyl, witnessed by Damita Dunnings RN

## 2017-05-20 NOTE — Progress Notes (Signed)
PULMONARY / CRITICAL CARE MEDICINE   Name: Harold Bailey MRN: [id number] DOB: [birth year]    ADMISSION DATE:  05/10/2017 CONSULTATION DATE:  05/11/17  REFERRING MD:  Dr. Loleta Books  CHIEF COMPLAINT:  Resp Distress   HISTORY OF PRESENT ILLNESS:   51 yo male smoker with hx of COPD/Asthma , Seizure disorder, SVT , Bipolar, substance abuse with recent admission to outlying hospital with chest pain. He had SVT, cardioverted with Adenosine . LHC showed nonobstructive CAD. He developed bronchitic symptoms during this admission and was treated with prednisone and omnicef. CXR and CT chest were reported as neg for PNA. 2 days prior to admission he developed progressively worsening sob, cough and wheezing . He was brought to ER on 8/18 and admitted by [facility] . He was started on solumedrol, nebs, and abx. LA was normal . CXR showed on pna.  Early am on 819 with progressive decline with hypoxia, increased wob and lethargy . PCCM asked to consult. Pt was moved to ICU and intubated. Bedside bronchoscopy showed very thick airway secretions. Airway .    SUBJECTIVE:  No events overnight, no commands  VITAL SIGNS: BP (!) 118/102   Pulse (!) 109   Temp 99.2 F (37.3 C) (Oral)   Resp 20   Ht 6' (1.829 m)   Wt 104.6 kg (230 lb 9.6 oz)   SpO2 (!) 88%   BMI 31.28 kg/m   HEMODYNAMICS: CVP:  [1 mmHg-8 mmHg] 4 mmHg  VENTILATOR SETTINGS: Vent Mode: PSV;CPAP FiO2 (%):  [40 %] 40 % Set Rate:  [22 bmp] 22 bmp Vt Set:  [494 mL] 620 mL PEEP:  [5 cmH20] 5 cmH20 Pressure Support:  [8 cmH20] 8 cmH20 Plateau Pressure:  [13 cmH20-18 cmH20] 16 cmH20  INTAKE / OUTPUT: I/O last 3 completed shifts: In: 9557.1 [I.V.:2782.1; NG/GT:3575; IV Piggyback:3200] Out: 6480 [Urine:5780; Stool:700]  PHYSICAL EXAMINATION: General:  Obese male, awake and interactive, following commands Neuro: Awake and interactive, moving all ext to command HEENT:  ETT in place, [place]/AT, PERRL, EOM-I and MMM Cardiovascular:  RRR, Nl S1/S2,  -M/R/G. Lungs: Decrease air movement Abdomen:  Obese, NT, ND and BS+. Musculoskeletal:  -edema and -tenderness Skin:  No rash, intact  LABS:  BMET  Recent Labs Lab 05/18/17 0504 05/19/17 0415 05/20/17 0454  NA 151* 151* 152*  K 3.6 3.3* 2.7*  CL 101 110 115*  CO2 36* 32 30  BUN 72* 45* 36*  CREATININE 1.05 0.80 0.69  GLUCOSE 159* 173* 187*   Electrolytes  Recent Labs Lab 05/18/17 0504 05/19/17 0415 05/20/17 0454  CALCIUM 9.6 8.5* 7.5*  MG 2.9* 2.5* 2.0  PHOS 2.8 3.1 1.8*   CBC  Recent Labs Lab 05/18/17 0504 05/19/17 0415 05/20/17 0454  WBC 23.6* 11.5* 11.1*  HGB 18.6* 17.0 15.0  HCT 58.2* 56.6* 49.1  PLT 110* 99* 88*   Coag's No results for input(s): APTT, INR in the last 168 hours.  Sepsis Markers  Recent Labs Lab 05/13/17 1137 05/14/17 0243 05/15/17 0440  PROCALCITON <0.10 <0.10 <0.10   ABG  Recent Labs Lab 05/17/17 0350 05/18/17 0345 05/20/17 0236  PHART 7.451* 7.428 7.476*  PCO2ART 53.2* 52.4* 45.1  PO2ART 81.6* 72.2* 58.0*   Liver Enzymes No results for input(s): AST, ALT, ALKPHOS, BILITOT, ALBUMIN in the last 168 hours. Cardiac Enzymes No results for input(s): TROPONINI, PROBNP in the last 168 hours.  Glucose  Recent Labs Lab 05/19/17 1155 05/19/17 1526 05/19/17 2106 05/20/17 0017 05/20/17 0328 05/20/17 0755  GLUCAP 143* 141*  126* 206* 167* 118*   Imaging Dg Chest Port 1 View  Result Date: 05/20/2017 CLINICAL DATA:  Intubation. EXAM: PORTABLE CHEST 1 VIEW COMPARISON:  05/19/2017 . FINDINGS: Endotracheal tube, NG tube, left PICC line stable position. Heart size stable. Persistent but improved basilar subsegmental atelectasis . No definite infiltrate noted. Stable elevation right hemidiaphragm no pleural effusion or pneumothorax . IMPRESSION: 1. Lines and tubes in stable position. 2. Persistent but improved bibasilar subsegmental atelectasis . No definite infiltrate noted. Small right pleural effusion. Stable elevation right  hemidiaphragm . Electronically Signed   By: Marcello Moores  Register   On: 05/20/2017 07:26   Dg Abd Portable 1v  Result Date: 05/19/2017 CLINICAL DATA:  51 year old may male status post feeding tube placement. EXAM: PORTABLE ABDOMEN - 1 VIEW COMPARISON:  None. FINDINGS: Markedly limited examination secondary to positioning and technique. An enteric tube courses below the diaphragm with the distal tip overlying the gastric body/antrum. IMPRESSION: Enteric tube with the distal tip overlying the gastric body/antrum. Electronically Signed   By: Kristopher Oppenheim M.D.   On: 05/19/2017 10:59   STUDIES:  Bronch 8/19- tracheal and left main and RUL collapse thick pus  CULTURES: 8/19 Sputum cx Bronch bal >>MRSA 8/19 bc>>> 8/24 C diff positive  ANTIBIOTICS: 8/19 Vanc >>8/29 8/19 Fortax>>>8/23>>>8/24>>>8/28 8/24 PO Vanc  SIGNIFICANT EVENTS: 8/19 ETT emergent, bronch with collapse  LINES/TUBES: 8/19 ETT >>8/28 PIV  DISCUSSION: 51 yo male smoker with Bipolar/substance abuse admitted in COPD exacerbation with progressive decline with hypercarbic respiratory failure requiring intubation /vent support on 8/19 .  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  SVT episode s/p adenosine 1 week prior to admit  HTN  P:  Hold home meds (Lisinopril/HCTZ) Monitor tele D/C neo  RENAL hypophos  P:   Replace electrolytes as indicated  F/u chem  KVO IVF D/C Lasix K3PO4 replacement  GASTROINTESTINAL A:   NPO  P:   PPI  TF per nutrition  HEMATOLOGIC A:  Secondary polycythemia dvt prevention  P:  Lovenox  SQ CBC in AM  INFECTIOUS A:   COPD exacerbation \hcap C diff positive P:   Cultures noted Continue PO vanc D/C IV vanc in AM D/C ceftaz D/ced azithro HIV negative  ENDOCRINE A:    Hyperglycemia  P:   SSI  D/C TF coverage D/C Lantus  NEUROLOGIC A:   Seizure disorder, vent dyscherony P:   RASS goal: -1  Cont seizure regimen  D/Ced propofol D/C versed D/C fentanyl  FAMILY  - Updates: Family and patient updated bedside  - Inter-disciplinary family meet or Palliative Care meeting due by:  05/18/17   The patient is critically ill with multiple organ systems failure and requires high complexity decision making for assessment and support, frequent evaluation and titration of therapies, application of advanced monitoring technologies and extensive interpretation of multiple databases.   Critical Care Time devoted to patient care services described in this note is  35  Minutes. This time reflects time of care of this signee Dr Jennet Maduro. This critical care time does not reflect procedure time, or teaching time or supervisory time  of PA/NP/Med student/Med Resident etc but could involve care discussion time.  Rush Farmer, M.D. [facility] Pulmonary/Critical Care Medicine. Pager: [phone]. After hours pager: [phone].

## 2017-05-20 NOTE — Progress Notes (Signed)
Low 02 saturations on 6LNC at 86-87%. Placed patient on high flow [place] at 8L. 02 saturations now at 92%. RT will continue to monitor.

## 2017-05-21 ENCOUNTER — Inpatient Hospital Stay (HOSPITAL_COMMUNITY): Payer: Medicare Other

## 2017-05-21 DIAGNOSIS — J9611 Chronic respiratory failure with hypoxia: Secondary | ICD-10-CM

## 2017-05-21 DIAGNOSIS — J9621 Acute and chronic respiratory failure with hypoxia: Secondary | ICD-10-CM

## 2017-05-21 DIAGNOSIS — J9622 Acute and chronic respiratory failure with hypercapnia: Secondary | ICD-10-CM

## 2017-05-21 LAB — GLUCOSE, CAPILLARY
GLUCOSE-CAPILLARY: 174 mg/dL — AB (ref 65–99)
GLUCOSE-CAPILLARY: 82 mg/dL (ref 65–99)
Glucose-Capillary: 89 mg/dL (ref 65–99)
Glucose-Capillary: 113 mg/dL — ABNORMAL HIGH (ref 65–99)
Glucose-Capillary: 137 mg/dL — ABNORMAL HIGH (ref 65–99)
Glucose-Capillary: 116 mg/dL — ABNORMAL HIGH (ref 65–99)
Glucose-Capillary: 207 mg/dL — ABNORMAL HIGH (ref 65–99)

## 2017-05-21 LAB — POCT I-STAT 3, ART BLOOD GAS (G3+)
ACID-BASE EXCESS: 5 mmol/L — AB (ref 0.0–2.0)
BICARBONATE: 29 mmol/L — AB (ref 20.0–28.0)
O2 Saturation: 95 %
PH ART: 7.488 — AB (ref 7.350–7.450)
PO2 ART: 70 mmHg — AB (ref 83.0–108.0)
Patient temperature: 98.3
TCO2: 30 mmol/L (ref 22–32)
pCO2 arterial: 38.2 mmHg (ref 32.0–48.0)

## 2017-05-21 LAB — CBC
HEMATOCRIT: 46.5 % (ref 39.0–52.0)
Hemoglobin: 14.8 g/dL (ref 13.0–17.0)
MCH: 33.8 pg (ref 26.0–34.0)
MCHC: 31.8 g/dL (ref 30.0–36.0)
MCV: 106.2 fL — ABNORMAL HIGH (ref 78.0–100.0)
PLATELETS: 102 10*3/uL — AB (ref 150–400)
RBC: 4.38 MIL/uL (ref 4.22–5.81)
RDW: 14.1 % (ref 11.5–15.5)
WBC: 10.4 10*3/uL (ref 4.0–10.5)

## 2017-05-21 LAB — PHOSPHORUS: PHOSPHORUS: 2.3 mg/dL — AB (ref 2.5–4.6)

## 2017-05-21 LAB — BASIC METABOLIC PANEL
ANION GAP: 6 (ref 5–15)
BUN: 23 mg/dL — ABNORMAL HIGH (ref 6–20)
CALCIUM: 7.8 mg/dL — AB (ref 8.9–10.3)
CO2: 30 mmol/L (ref 22–32)
Chloride: 119 mmol/L — ABNORMAL HIGH (ref 101–111)
Creatinine, Ser: 0.65 mg/dL (ref 0.61–1.24)
Glucose, Bld: 99 mg/dL (ref 65–99)
Potassium: 3.5 mmol/L (ref 3.5–5.1)
SODIUM: 155 mmol/L — AB (ref 135–145)

## 2017-05-21 LAB — TRIGLYCERIDES: TRIGLYCERIDES: 205 mg/dL — AB (ref <150)

## 2017-05-21 LAB — CULTURE, BLOOD (ROUTINE X 2)
CULTURE: NO GROWTH
Culture: NO GROWTH
SPECIAL REQUESTS: ADEQUATE
Special Requests: ADEQUATE

## 2017-05-21 LAB — MAGNESIUM: MAGNESIUM: 2.2 mg/dL (ref 1.7–2.4)

## 2017-05-21 MED ORDER — ARFORMOTEROL TARTRATE 15 MCG/2ML IN NEBU
15.0000 ug | INHALATION_SOLUTION | Freq: Two times a day (BID) | RESPIRATORY_TRACT | Status: DC
  Administered 2017-05-21 – 2017-05-26 (×12): 15 ug via RESPIRATORY_TRACT
  Filled 2017-05-21 (×14): qty 2

## 2017-05-21 MED ORDER — MIDAZOLAM HCL 2 MG/2ML IJ SOLN
INTRAMUSCULAR | Status: AC
  Filled 2017-05-21: qty 2

## 2017-05-21 MED ORDER — GUAIFENESIN 100 MG/5ML PO SOLN
5.0000 mL | Freq: Four times a day (QID) | ORAL | Status: DC
  Administered 2017-05-21 – 2017-05-27 (×25): 100 mg
  Filled 2017-05-21 (×26): qty 5

## 2017-05-21 MED ORDER — MIDAZOLAM HCL 2 MG/2ML IJ SOLN
INTRAMUSCULAR | Status: AC
  Administered 2017-05-21: 4 mg
  Filled 2017-05-21: qty 4

## 2017-05-21 MED ORDER — FREE WATER
200.0000 mL | Freq: Four times a day (QID) | Status: DC
  Administered 2017-05-21 – 2017-05-27 (×24): 200 mL

## 2017-05-21 MED ORDER — FENTANYL CITRATE (PF) 100 MCG/2ML IJ SOLN
100.0000 ug | INTRAMUSCULAR | Status: AC | PRN
  Administered 2017-05-21 (×3): 100 ug via INTRAVENOUS
  Filled 2017-05-21 (×2): qty 2

## 2017-05-21 MED ORDER — METHYLPREDNISOLONE SODIUM SUCC 40 MG IJ SOLR
40.0000 mg | Freq: Two times a day (BID) | INTRAMUSCULAR | Status: DC
  Administered 2017-05-21 – 2017-05-23 (×4): 40 mg via INTRAVENOUS
  Filled 2017-05-21 (×4): qty 1

## 2017-05-21 MED ORDER — SODIUM CHLORIDE 3 % IN NEBU
4.0000 mL | INHALATION_SOLUTION | Freq: Two times a day (BID) | RESPIRATORY_TRACT | Status: AC
  Administered 2017-05-21 – 2017-05-23 (×6): 4 mL via RESPIRATORY_TRACT
  Filled 2017-05-21 (×6): qty 4

## 2017-05-21 MED ORDER — JEVITY 1.2 CAL PO LIQD
1000.0000 mL | ORAL | Status: DC
  Administered 2017-05-21: 75 mL
  Administered 2017-05-22: 1000 mL
  Filled 2017-05-21 (×4): qty 1000

## 2017-05-21 MED ORDER — POTASSIUM PHOSPHATES 15 MMOLE/5ML IV SOLN
10.0000 mmol | Freq: Once | INTRAVENOUS | Status: AC
  Administered 2017-05-21: 10 mmol via INTRAVENOUS
  Filled 2017-05-21: qty 3.33

## 2017-05-21 MED ORDER — DEXTROSE 5 % IV SOLN: INTRAVENOUS | Status: DC

## 2017-05-21 MED ORDER — DEXTROSE 5 % IV SOLN
INTRAVENOUS | Status: DC
  Administered 2017-05-21 – 2017-05-23 (×4): via INTRAVENOUS

## 2017-05-21 MED ORDER — POTASSIUM CHLORIDE 10 MEQ/50ML IV SOLN
10.0000 meq | INTRAVENOUS | Status: AC
  Administered 2017-05-21 (×2): 10 meq via INTRAVENOUS
  Filled 2017-05-21 (×2): qty 50

## 2017-05-21 MED ORDER — PROPOFOL 1000 MG/100ML IV EMUL
0.0000 ug/kg/min | INTRAVENOUS | Status: DC
  Administered 2017-05-21: 50 ug/kg/min via INTRAVENOUS
  Administered 2017-05-22: 60 ug/kg/min via INTRAVENOUS
  Administered 2017-05-22 (×2): 70 ug/kg/min via INTRAVENOUS
  Administered 2017-05-22: 60 ug/kg/min via INTRAVENOUS
  Administered 2017-05-22 (×3): 70 ug/kg/min via INTRAVENOUS
  Administered 2017-05-22: 60 ug/kg/min via INTRAVENOUS
  Administered 2017-05-22 – 2017-05-23 (×2): 70 ug/kg/min via INTRAVENOUS
  Administered 2017-05-23: 60 ug/kg/min via INTRAVENOUS
  Administered 2017-05-23: 70 ug/kg/min via INTRAVENOUS
  Administered 2017-05-23 (×2): 60 ug/kg/min via INTRAVENOUS
  Administered 2017-05-23: 70 ug/kg/min via INTRAVENOUS
  Administered 2017-05-23: 50 ug/kg/min via INTRAVENOUS
  Administered 2017-05-24 (×2): 65 ug/kg/min via INTRAVENOUS
  Filled 2017-05-21 (×23): qty 100

## 2017-05-21 MED ORDER — FENTANYL CITRATE (PF) 100 MCG/2ML IJ SOLN
100.0000 ug | INTRAMUSCULAR | Status: DC | PRN
  Administered 2017-05-21 – 2017-05-23 (×4): 100 ug via INTRAVENOUS
  Filled 2017-05-21 (×6): qty 2

## 2017-05-21 MED ORDER — PROPOFOL 1000 MG/100ML IV EMUL
INTRAVENOUS | Status: AC
  Filled 2017-05-21: qty 100

## 2017-05-21 MED ORDER — FENTANYL CITRATE (PF) 100 MCG/2ML IJ SOLN
INTRAMUSCULAR | Status: AC
  Administered 2017-05-21: 100 ug via INTRAVENOUS
  Filled 2017-05-21: qty 2

## 2017-05-21 MED ORDER — MIDAZOLAM HCL 2 MG/2ML IJ SOLN
INTRAMUSCULAR | Status: AC
  Administered 2017-05-21: 4 mg
  Filled 2017-05-21: qty 2

## 2017-05-21 MED ORDER — ETOMIDATE 2 MG/ML IV SOLN
30.0000 mg | Freq: Once | INTRAVENOUS | Status: AC
  Administered 2017-05-21: 30 mg via INTRAVENOUS

## 2017-05-21 MED ORDER — BUDESONIDE 0.5 MG/2ML IN SUSP
0.5000 mg | Freq: Two times a day (BID) | RESPIRATORY_TRACT | Status: DC
  Administered 2017-05-21 – 2017-05-27 (×13): 0.5 mg via RESPIRATORY_TRACT
  Filled 2017-05-21 (×13): qty 2

## 2017-05-21 NOTE — Progress Notes (Signed)
Cortrak Tube Team Note:  Consult received to place a Cortrak feeding tube.   A 10 F Cortrak tube was placed in the right nare and secured with a nasal bridle at 92 cm. Per the Cortrak monitor reading the tube tip is post-pyloric.   No x-ray is required. RN may begin using tube.    If the tube becomes dislodged please keep the tube and contact the Cortrak team at [email or web address] (password [email or web address]) for replacement.  If after hours and replacement cannot be delayed, place a NG tube and confirm placement with an abdominal x-ray.   Kerman Passey MS, RD, LDN [phone] Pager  [phone] Weekend/On-Call Pager

## 2017-05-21 NOTE — Progress Notes (Signed)
[facility] ADULT ICU REPLACEMENT PROTOCOL FOR AM LAB REPLACEMENT ONLY  The patient does apply for the [facility] Adult ICU Electrolyte Replacment Protocol based on the criteria listed below:   1. Is GFR >/= 40 ml/min? Yes.    Patient's GFR today is >60 2. Is urine output >/= 0.5 ml/kg/hr for the last 6 hours? Yes.   Patient's UOP is 0.56 ml/kg/hr 3. Is BUN < 60 mg/dL? Yes.    Patient's BUN today is 23 4. Abnormal electrolyte(s): 3.5 5. Ordered repletion with:per protocol 6. If a panic level lab has been reported, has the CCM MD in charge been notified? Yes.  .   Physician:  Dr. Delene Loll, Philis Nettle 05/21/2017 5:14 AM

## 2017-05-21 NOTE — Procedures (Signed)
Intubation Procedure Note Harold Bailey [id number] [birth year]  Procedure: Intubation Indications: Respiratory insufficiency  Procedure Details Consent: Risks of procedure as well as the alternatives and risks of each were explained to the (patient/caregiver).  Consent for procedure obtained. Time Out: Verified patient identification, verified procedure, site/side was marked, verified correct patient position, special equipment/implants available, medications/allergies/relevent history reviewed, required imaging and test results available.  Performed   MAC and 3 glidoscope with 7.5 ET stabilized at 25 at the lips     Evaluation Hemodynamic Status: BP stable throughout; O2 sats: stable throughout Patient's Current Condition: stable Complications: No apparent complications Patient did tolerate procedure well. Chest X-ray ordered to verify placement.  CXR: pending.   Harold Bailey 05/21/2017

## 2017-05-21 NOTE — Evaluation (Signed)
Clinical/Bedside Swallow Evaluation Patient Details  Name: Harold Bailey MRN: [id number] Date of Birth: [birth year]  Today's Date: 05/21/2017 Time: SLP Start Time (ACUTE ONLY): 0931 SLP Stop Time (ACUTE ONLY): 0953 SLP Time Calculation (min) (ACUTE ONLY): 22 min  Past Medical History:  Past Medical History:  Diagnosis Date  . Anxiety   . Arthritis    Left ankle  . COPD (chronic obstructive pulmonary disease) ([place])   . Drug abuse   . Gait disturbance   . GERD (gastroesophageal reflux disease)   . 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  Marland Kitchen Hyperlipidemia   . Hypertension   . 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 Memory loss   . Peripheral vision loss    Left due to brain surgery  . Pneumonia   . Polycythemia   . Prediabetes   . Seizures ([place]) 11/19/2013   none since 2016  . Shortness of breath    Hx of smoking.   Past Surgical History:  Past Surgical History:  Procedure Laterality Date  . ANKLE ARTHROSCOPY  01/17/2012   Procedure: ANKLE ARTHROSCOPY;  Surgeon: [name], MD;  Location: [place];  Service: Orthopedics;  Laterality: Left;  . ANKLE ARTHROSCOPY WITH FUSION Left 10/02/2016   Procedure: Left Posterior Subtalar Arthrodesis Arthroscopy;  Surgeon: [name], MD;  Location: [place];  Service: Orthopedics;  Laterality: Left;  . ANKLE FUSION Left 11/2012   Dr Sharol Given  . ANKLE SURGERY  2003   Left  . BRAIN SURGERY  2009   Biopsy  . FRACTURE SURGERY  2003   Left ankle  . HARDWARE REMOVAL Left 12/08/2012   Procedure: HARDWARE REMOVAL;  Surgeon: [name], MD;  Location: [place];  Service: Orthopedics;  Laterality: Left;  Removal Deep Hardware, Take Down Non-Union, Revision Internal Fixation Left Ankle  . I&D EXTREMITY Left 01/01/2013   Procedure: IRRIGATION AND DEBRIDEMENT EXTREMITY;  Surgeon: [name], MD;  Location: [place];  Service: Orthopedics;  Laterality: Left;  Irrigation,  Debridement and Placement Antibiotic Beads Left Ankle  . INNER EAR SURGERY    . OPEN REDUCTION INTERNAL FIXATION (ORIF) SCAPHOID WITH DISTAL RADIUS GRAFT Right 02/01/2013   Procedure: OPEN REDUCTION INTERNAL FIXATION (ORIF) RIGHT SCAPHOID FRACTURE WITH DISTAL RADIUS GRAFT;  Surgeon: Schuyler Amor, MD;  Location: [place];  Service: Orthopedics;  Laterality: Right;  . ORIF ANKLE FRACTURE Left 12/08/2012   Procedure: OPEN REDUCTION INTERNAL FIXATION (ORIF) ANKLE FRACTURE;  Surgeon: [name], MD;  Location: [place];  Service: Orthopedics;  Laterality: Left;  Removal Deep Hardware, Take Down Non-Union, Revision Internal Fixation Left Ankle  . ORIF ANKLE FRACTURE Right 09/03/2013   Procedure: OPEN REDUCTION INTERNAL FIXATION (ORIF) ANKLE FRACTURE;  Surgeon: [name], MD;  Location: [place];  Service: Orthopedics;  Laterality: Right;  Open Reduction Internal Fixation Right Fibula  . SCAPHOID FRACTURE SURGERY  02/04/2013  . SHOULDER ARTHROSCOPY  2008   Left  . TYMPANOSTOMY TUBE PLACEMENT  1993   HPI:  Pt is a 51 yo male smoker admitted in COPD exacerbation with progressive decline with hypercarbic respiratory failure requiring intubation 8/19-8/28. PMH: Bipolar, substance abuse, COPD, seizures, SOB, PNA, memory deficit, HTN, GERD, anxiety   Assessment / Plan / Recommendation Clinical Impression  Pt has low, hoarse vocal quality and a weak cough, but he does bear down in an attempt to increase subglottal force with Mod I.  Delayed but consistent coughing follows small amounts of water. Recommend to remain NPO except for a few ice chips following oral care pending improved voicing and decreased signs of aspiration clinically which would indicate readiness to instrumental testing. SLP Visit Diagnosis: Dysphagia, unspecified (R13.10)    Aspiration Risk  Moderate aspiration risk;Severe aspiration risk    Diet Recommendation NPO;Ice chips PRN after oral care   Medication Administration: Via  alternative means    Other  Recommendations Oral Care Recommendations: Oral care QID;Oral care prior to ice chip/H20   Follow up Recommendations  (tba)      Frequency and Duration min 3x week  2 weeks       Prognosis Prognosis for Safe Diet Advancement: Good      Swallow Study   General HPI: Pt is a 51 yo male smoker admitted in COPD exacerbation with progressive decline with hypercarbic respiratory failure requiring intubation 8/19-8/28. PMH: Bipolar, substance abuse, COPD, seizures, SOB, PNA, memory deficit, HTN, GERD, anxiety Type of Study: Bedside Swallow Evaluation Previous Swallow Assessment: none in chart Diet Prior to this Study: NPO Temperature Spikes Noted: No Respiratory Status: Nasal cannula History of Recent Intubation: Yes Length of Intubations (days): 10 days Date extubated: 05/20/17 Behavior/Cognition: Alert;Cooperative;Requires cueing Oral Cavity Assessment: Dry Oral Care Completed by SLP: No Oral Cavity - Dentition: Edentulous;Dentures, not available Vision: Functional for self-feeding Patient Positioning: Upright in bed Baseline Vocal Quality: Hoarse;Low vocal intensity Volitional Cough: Weak;Other (Comment) (pt bares down to increase effort with Mod I)    Oral/Motor/Sensory Function Overall Oral Motor/Sensory Function: Within functional limits   Ice Chips Ice chips: Within functional limits Presentation: Spoon   Thin Liquid Thin Liquid: Impaired Presentation: Spoon Pharyngeal  Phase Impairments: Cough - Delayed    Nectar Thick Nectar Thick Liquid: Not tested   Honey Thick Honey Thick Liquid: Not tested   Puree Puree: Not tested   Solid   GO   Solid: Not tested        Germain Osgood 05/21/2017,10:15 AM  Germain Osgood, M.A. CCC-SLP [phone]

## 2017-05-21 NOTE — Progress Notes (Signed)
Pt continues to have increased work of breathing while on [place]. MD at bedside preparing to intubate. Versed 4mg  administered (2128), etomidate 30mg  (2128), prop started (2130), Fentany 16mcg (2136), another 4mg  of Versed (2142). Pt was intubated at 2130. OG placed and CXR ordered to confirm placement of both.

## 2017-05-21 NOTE — Progress Notes (Signed)
Nutrition Follow-up  DOCUMENTATION CODES:   Obesity unspecified  INTERVENTION:    Jevity 1.2 at 75 ml/h (1800 ml per day)  Provides 2160 kcal, 100 gm protein, 1458 ml free water daily  NUTRITION DIAGNOSIS:   Inadequate oral intake related to acute illness as evidenced by NPO status.  Ongoing  GOAL:   Patient will meet greater than or equal to 90% of their needs  Unmet  MONITOR:   TF tolerance, Diet advancement, Labs, I & O's  REASON FOR ASSESSMENT:   Consult, Ventilator Enteral/tube feeding initiation and management  ASSESSMENT:   51 yo male admitted with dyspnea with COPD exacerbation, pneumonia. Pt with hx of anxiety, COPD, seizures, substance abuse, bipolar disorder  Patient was extubated on 8/28. Discussed patient in ICU rounds and with RN today. Being treated for C diff. Patient failed swallow evaluation with SLP today. Cortrak tube being placed. RD to order TF. Labs reviewed: sodium 155 (H), phosphorus 2.3 (L) Medications reviewed and include K phos.  Diet Order:  Diet NPO time specified  Skin:  Reviewed, no issues  Last BM:  8/28  Height:   Ht Readings from Last 1 Encounters:  05/10/17 6' (1.829 m)    Weight:   Wt Readings from Last 1 Encounters:  05/21/17 227 lb 1.2 oz (103 kg)    Ideal Body Weight:  80.9 kg  BMI:  Body mass index is 30.8 kg/m.  Estimated Nutritional Needs:   Kcal:  2000-2200  Protein:  100-120 gm  Fluid:  2-2.2 L  EDUCATION NEEDS:   No education needs identified at this time  Molli Barrows, [place], [place], [place] Pager [phone] After Hours Pager [phone]

## 2017-05-21 NOTE — Care Management Note (Addendum)
Case Management Note  Patient Details  Name: Harold Bailey MRN: [id number] Date of Birth: Aug 16, 1966  Subjective/Objective:     Pt admitted with resp distress and PNA  - progressively declined in ED requiring intubation        Action/Plan:   PTA from home with spouse.  CM will continue to follow for discharge needs   Expected Discharge Date:                  Expected Discharge Plan:     In-House Referral:     Discharge planning Services  CM Consult  Post Acute Care Choice:    Choice offered to:     DME Arranged:    DME Agency:     HH Arranged:    HH Agency:     Status of Service:     If discussed at [employer], dates discussed:    Additional Comments: 05/21/2017  CM spoke with Pts wife - CM explained the recommendation of LTACH.  Wife is in agreement with both facilities to contact her via phone.  CM to follow back up with wife tomorrow am at bedside.  Pt discussed in LOS 8/29 - pt remains appropriate for continued stay.  LTACH referral given by attending and discussed in LOS.  Both agencies will offer pt bed.  CM left voicemail for wife requesting call back Maryclare Labrador, RN 05/21/2017, 1:40 PM

## 2017-05-21 NOTE — Progress Notes (Signed)
PULMONARY / CRITICAL CARE MEDICINE   Name: Harold Bailey MRN: [id number] DOB: Jul 23, 1966    ADMISSION DATE:  05/10/2017 CONSULTATION DATE:  05/11/2017  REFERRING MD:  Loleta Books  CHIEF COMPLAINT:  Dyspnea  HISTORY OF PRESENT ILLNESS:   51 y/o male with COPD/asthma admitted for a flare of the same with MRSA bronchitis causing acute rspiratory failure necessitating intubation.   SUBJECTIVE:  Extubated yesterday Missed a dose of oral vanc SLP this morning> high risk for aspiration  VITAL SIGNS: BP 120/75   Pulse 79   Temp 98.2 F (36.8 C)   Resp (!) 25   Ht 6' (1.829 m)   Wt 103 kg (227 lb 1.2 oz)   SpO2 93%   BMI 30.80 kg/m   HEMODYNAMICS: CVP:  [6 mmHg-11 mmHg] 11 mmHg  VENTILATOR SETTINGS:    INTAKE / OUTPUT: I/O last 3 completed shifts: In: 5625.2 [I.V.:1692.7; NG/GT:2142.5; IV Piggyback:1790] Out: 6144 [Urine:4325; Stool:400]  PHYSICAL EXAMINATION:  General:  Resting comfortably in bed, slight tachypnea, frequent cough, speaking in full sentences HENT: NCAT OP clear PULM: rhonchi bilaterally B, normal effort CV: RRR, no mgr GI: BS+, soft, nontender MSK: normal bulk and tone Neuro: awake, alert, no distress, MAEW   LABS:  BMET  Recent Labs Lab 05/19/17 0415 05/20/17 0454 05/21/17 0416  NA 151* 152* 155*  K 3.3* 2.7* 3.5  CL 110 115* 119*  CO2 32 30 30  BUN 45* 36* 23*  CREATININE 0.80 0.69 0.65  GLUCOSE 173* 187* 99    Electrolytes  Recent Labs Lab 05/19/17 0415 05/20/17 0454 05/21/17 0416  CALCIUM 8.5* 7.5* 7.8*  MG 2.5* 2.0 2.2  PHOS 3.1 1.8* 2.3*    CBC  Recent Labs Lab 05/19/17 0415 05/20/17 0454 05/21/17 0416  WBC 11.5* 11.1* 10.4  HGB 17.0 15.0 14.8  HCT 56.6* 49.1 46.5  PLT 99* 88* 102*    Coag's No results for input(s): APTT, INR in the last 168 hours.  Sepsis Markers  Recent Labs Lab 05/15/17 0440  PROCALCITON <0.10    ABG  Recent Labs Lab 05/17/17 0350 05/18/17 0345 05/20/17 0236  PHART  7.451* 7.428 7.476*  PCO2ART 53.2* 52.4* 45.1  PO2ART 81.6* 72.2* 58.0*    Liver Enzymes No results for input(s): AST, ALT, ALKPHOS, BILITOT, ALBUMIN in the last 168 hours.  Cardiac Enzymes No results for input(s): TROPONINI, PROBNP in the last 168 hours.  Glucose  Recent Labs Lab 05/20/17 1156 05/20/17 1552 05/20/17 2013 05/21/17 0011 05/21/17 0358 05/21/17 0812  GLUCAP 85 115* 92 82 89 113*    Imaging No results found.   STUDIES:  Bronchoscopy 8/19 > tracheomalacia, left bronchomalacia, thick pus 8/21 CT chest> emphysema upper lobes, thick airways but no bronchiectasis, subsegmental atelectasis  CULTURES: 8/19 sputum culture > MRSA 8/19 blood > 8/24 blood >  8/24 c diff positive  ANTIBIOTICS: 8/19 IV vanc > 8/29 8/28 Fortaz > off 8/24 PO vanc  SIGNIFICANT EVENTS:   LINES/TUBES: 8/19 ETT > 8/28  DISCUSSION: 51 y/o male with COPD exacerbation, MRSA tracheobronchitis.  ASSESSMENT / PLAN:  PULMONARY A: COPD exacerbation Thick mucus/bronchitis Poor mucociliary clearance Probable aspiration P:   Continue solumedrol  Add brovana/pulmicort/hypertonic saline Add chest PT Continue bronchodilators Continue high flow O2, goal > 90%  CARDIOVASCULAR A:  No acute events P:  Tele Monitor BP  RENAL A:   Hypernatremia Hypophosphatemia P:   Start D5 50cc/hr Start free water via ET tube Monitor BMET and UOP Replace electrolytes as needed Kphos IV  today  GASTROINTESTINAL A:   Aspiration risk P:   NPO Place cor trak Start tube feedings  HEMATOLOGIC A:   No acute issues P:  Monitor for bleeding  INFECTIOUS A:   C diff MRSA tracheobronchitis P:   Stop IV vanc Continue oral vanc  ENDOCRINE A:   No acute issues P:   Monitor glucose  NEUROLOGIC A:   Seizure disorder Anxiety P:   Continue Vimpat and Depakote and gabapentin    FAMILY  - Updates: mother updated bedside by me 05/21/2017  My cc time 33 minutes  Roselie Awkward, MD [place] PCCM Pager: [phone] Cell: [phone] After 3pm or if no response, call [phone]   05/21/2017, 10:33 AM

## 2017-05-21 NOTE — Progress Notes (Signed)
[facility] PCCM PROGRESS NOTE  S: 51 year old male with COPD/asthma admitted for a flare of the same with MRSA bronchitis causing acute rspiratory failure necessitating intubation. Course complicated by thick secretions, chronic pulmonary disease, and possible aspiration. I was called to evaluate 8/29 PM for respiratory distress. On my evaluation patient states he is not short of breath, but then again, it is hard to make out what he is saying and he is somewhat confused. Wife and RN feel his respiratory status is worsened.   O: BP 124/79   Pulse (!) 127   Temp 98.3 F (36.8 C) (Oral)   Resp 18   Ht 6' (1.829 m)   Wt 103 kg (227 lb 1.2 oz)   SpO2 92%   BMI 30.80 kg/m   General:  Adult male of normal body habitus in mild/mod resp distress Neuro: Alert, oreitned to self/place. Somewhat confused.   HEENT:  [place]/AT, PERRL, no JVD Cardiovascular:  Tachy, regular, no MRG Lungs:  Coarse, poor air movement.  Abdomen:  Soft, non-tender, non-distended Musculoskeletal:  No acute deformity Skin:  Intact, MMM   A/P: Respiratory distress: etiology unclear, could be refractory COPD/asthma exacerbation or mucous plugging related to increased secretions. - ABG - BiPAP - Increase steroids including dose now. - Not a great BiPAP candidate, so if does not turn around quickly will proceed with intubation.   Georgann Housekeeper, ACNP [facility] Pulmonology/Critical Care Pager [phone] or [phone]

## 2017-05-22 ENCOUNTER — Inpatient Hospital Stay (HOSPITAL_COMMUNITY): Payer: Medicare Other

## 2017-05-22 DIAGNOSIS — M79609 Pain in unspecified limb: Secondary | ICD-10-CM

## 2017-05-22 LAB — CBC WITH DIFFERENTIAL/PLATELET
BASOS ABS: 0 10*3/uL (ref 0.0–0.1)
BASOS PCT: 0 %
EOS ABS: 0 10*3/uL (ref 0.0–0.7)
Eosinophils Relative: 0 %
HEMATOCRIT: 49.9 % (ref 39.0–52.0)
HEMOGLOBIN: 15.4 g/dL (ref 13.0–17.0)
Lymphocytes Relative: 7 %
Lymphs Abs: 0.9 10*3/uL (ref 0.7–4.0)
MCH: 33 pg (ref 26.0–34.0)
MCHC: 30.9 g/dL (ref 30.0–36.0)
MCV: 106.9 fL — AB (ref 78.0–100.0)
Monocytes Absolute: 0.8 10*3/uL (ref 0.1–1.0)
Monocytes Relative: 6 %
NEUTROS ABS: 11.5 10*3/uL — AB (ref 1.7–7.7)
NEUTROS PCT: 87 %
Platelets: 157 10*3/uL (ref 150–400)
RBC: 4.67 MIL/uL (ref 4.22–5.81)
RDW: 14.3 % (ref 11.5–15.5)
WBC: 13.2 10*3/uL — AB (ref 4.0–10.5)

## 2017-05-22 LAB — GLUCOSE, CAPILLARY
GLUCOSE-CAPILLARY: 170 mg/dL — AB (ref 65–99)
Glucose-Capillary: 172 mg/dL — ABNORMAL HIGH (ref 65–99)
Glucose-Capillary: 179 mg/dL — ABNORMAL HIGH (ref 65–99)
Glucose-Capillary: 282 mg/dL — ABNORMAL HIGH (ref 65–99)
Glucose-Capillary: 208 mg/dL — ABNORMAL HIGH (ref 65–99)
Glucose-Capillary: 216 mg/dL — ABNORMAL HIGH (ref 65–99)

## 2017-05-22 LAB — BASIC METABOLIC PANEL
Anion gap: 8 (ref 5–15)
Anion gap: 5 (ref 5–15)
BUN: 26 mg/dL — ABNORMAL HIGH (ref 6–20)
BUN: 24 mg/dL — ABNORMAL HIGH (ref 6–20)
CALCIUM: 7.9 mg/dL — AB (ref 8.9–10.3)
CHLORIDE: 110 mmol/L (ref 101–111)
CO2: 30 mmol/L (ref 22–32)
CO2: 31 mmol/L (ref 22–32)
CREATININE: 0.77 mg/dL (ref 0.61–1.24)
Calcium: 8.2 mg/dL — ABNORMAL LOW (ref 8.9–10.3)
Chloride: 116 mmol/L — ABNORMAL HIGH (ref 101–111)
Creatinine, Ser: 0.85 mg/dL (ref 0.61–1.24)
GFR calc Af Amer: >60 mL/min (ref >60)
GFR calc non Af Amer: >60 mL/min (ref >60)
Glucose, Bld: 189 mg/dL — ABNORMAL HIGH (ref 65–99)
Glucose, Bld: 236 mg/dL — ABNORMAL HIGH (ref 65–99)
Potassium: 4.1 mmol/L (ref 3.5–5.1)
Potassium: 3.6 mmol/L (ref 3.5–5.1)
Sodium: 154 mmol/L — ABNORMAL HIGH (ref 135–145)
Sodium: 146 mmol/L — ABNORMAL HIGH (ref 135–145)

## 2017-05-22 MED ORDER — PRO-STAT SUGAR FREE PO LIQD
60.0000 mL | Freq: Four times a day (QID) | ORAL | Status: DC
  Administered 2017-05-22 – 2017-05-27 (×18): 60 mL via ORAL
  Filled 2017-05-22 (×22): qty 60

## 2017-05-22 MED ORDER — CHLORHEXIDINE GLUCONATE 0.12% ORAL RINSE (MEDLINE KIT)
15.0000 mL | Freq: Two times a day (BID) | OROMUCOSAL | Status: DC
  Administered 2017-05-22 – 2017-05-23 (×4): 15 mL via OROMUCOSAL

## 2017-05-22 MED ORDER — VITAL HIGH PROTEIN PO LIQD
1000.0000 mL | ORAL | Status: DC
  Administered 2017-05-22 – 2017-05-27 (×3): 1000 mL

## 2017-05-22 MED ORDER — FAMOTIDINE IN NACL 20-0.9 MG/50ML-% IV SOLN
20.0000 mg | Freq: Two times a day (BID) | INTRAVENOUS | Status: DC
  Administered 2017-05-22 – 2017-05-26 (×10): 20 mg via INTRAVENOUS
  Filled 2017-05-22 (×11): qty 50

## 2017-05-22 MED ORDER — ORAL CARE MOUTH RINSE
15.0000 mL | OROMUCOSAL | Status: DC
Start: 2017-05-22 — End: 2017-05-23
  Administered 2017-05-22 – 2017-05-23 (×15): 15 mL via OROMUCOSAL

## 2017-05-22 MED ORDER — VANCOMYCIN 50 MG/ML ORAL SOLUTION
125.0000 mg | Freq: Four times a day (QID) | ORAL | Status: DC
  Administered 2017-05-22 – 2017-05-27 (×21): 125 mg
  Filled 2017-05-22 (×22): qty 2.5

## 2017-05-22 NOTE — Progress Notes (Signed)
Nutrition Follow-up  DOCUMENTATION CODES:   Obesity unspecified  INTERVENTION:   To better meet nutrition needs now that patient is re-intubated, change TF:   Vital High Protein at 20 ml/h   Pro-stat 60 ml QID  Provides 1280 kcal, 162 gm protein, 401 ml free water daily  Total intake including kcal from Propofol will be 2259 kcal, unable to meet protein needs without exceeding kcal goal.  NUTRITION DIAGNOSIS:   Inadequate oral intake related to acute illness as evidenced by NPO status.  Ongoing  GOAL:   Patient will meet greater than or equal to 90% of their needs  Met with TF  MONITOR:   TF tolerance, Diet advancement, Labs, I & O's  ASSESSMENT:   51 yo male admitted with dyspnea with COPD exacerbation, pneumonia. Pt with hx of anxiety, COPD, seizures, substance abuse, bipolar disorder.  Discussed patient in ICU rounds and with RN today. Patient required re-intubation last night due to mucous plugging.  Plans for trach placement tomorrow morning. TF was held for vomiting, but has been resumed. Patient is currently receiving Jevity 1.2 via post-pyloric Cortrak tube at 75 ml/h (1800 ml/day) to provide 2160 kcals, 100 gm protein, 1458 ml free water daily.  Free water flushes 200 ml every 6 hours. Patient is currently intubated on ventilator support Temp (24hrs), Avg:99 F (37.2 C), Min:98.3 F (36.8 C), Max:99.4 F (37.4 C)  Propofol: 37.1 ml/hr providing 979 kcal per day from lipid. Labs reviewed. Sodium 154 (H) CBG's: 172-179-170 Medications reviewed.  Diet Order:  Diet NPO time specified  Skin:  Reviewed, no issues  Last BM:  8/28  Height:   Ht Readings from Last 1 Encounters:  05/10/17 6' (1.829 m)    Weight:   Wt Readings from Last 1 Encounters:  05/22/17 225 lb 15.5 oz (102.5 kg)    Ideal Body Weight:  80.9 kg  BMI:  Body mass index is 30.65 kg/m.  Estimated Nutritional Needs:   Kcal:  1130-1435  Protein:  162 gm  Fluid:  2-2.2  L  EDUCATION NEEDS:   No education needs identified at this time  Kimberly Harris, RD, LDN, CNSC Pager [phone] After Hours Pager [phone]\ 

## 2017-05-22 NOTE — Progress Notes (Signed)
SLP Cancellation Note  Patient Details Name: RUDI BUNYARD MRN: [id number] DOB: January 07, 1966   Cancelled treatment:       Reason Eval/Treat Not Completed: Medical issues which prohibited therapy (reintubated). Will f/u as able.   Germain Osgood 05/22/2017, 8:48 AM  Germain Osgood, M.A. CCC-SLP [phone]

## 2017-05-22 NOTE — Care Management Note (Signed)
Case Management Note  Patient Details  Name: DEVARIUS NELLES MRN: [id number] Date of Birth: 01-06-1966  Subjective/Objective:     Pt admitted with resp distress and PNA  - progressively declined in ED requiring intubation        Action/Plan:   PTA from home with spouse.  CM will continue to follow for discharge needs   Expected Discharge Date:                  Expected Discharge Plan:     In-House Referral:     Discharge planning Services  CM Consult  Post Acute Care Choice:    Choice offered to:     DME Arranged:    DME Agency:     HH Arranged:    HH Agency:     Status of Service:     If discussed at [employer], dates discussed:    Additional Comments: 05/22/2017  Pt had to be re-intubated overnight - plan is for trach 8/31.  Wife spoke with both [facility] and [facility] liaison and chose [facility].  CM will follow up tomorrow post trach placement.   05/21/17 CM spoke with Pts wife - CM explained the recommendation of LTACH.  Wife is in agreement with both facilities to contact her via phone.  CM to follow back up with wife tomorrow am at bedside.  Pt discussed in LOS 8/29 - pt remains appropriate for continued stay.  LTACH referral given by attending and discussed in LOS.  Both agencies will offer pt bed.  CM left voicemail for wife requesting call back Maryclare Labrador, RN 05/22/2017, 2:38 PM

## 2017-05-22 NOTE — Progress Notes (Signed)
PT Cancellation Note  Patient Details Name: Harold Bailey MRN: [id number] DOB: 10/24/1965   Cancelled Treatment:    Reason Eval/Treat Not Completed: Medical issues which prohibited therapy. Pt now re-intubated.    [place] 05/22/2017, 9:35 AM [employer] PT [phone]

## 2017-05-22 NOTE — Progress Notes (Signed)
PULMONARY / CRITICAL CARE MEDICINE   Name: Harold Bailey MRN: [id number] DOB: 1966-07-20    ADMISSION DATE:  05/10/2017 CONSULTATION DATE:  05/11/2017  REFERRING MD:  Loleta Books  CHIEF COMPLAINT:  Dyspnea  Brief:   51 y/o male with COPD/asthma admitted for a flare of the same with MRSA bronchitis causing acute rspiratory failure necessitating intubation.   SUBJECTIVE:  Extubated 8/28. Intubated overnight for respiratory distress.   VITAL SIGNS: BP 113/86   Pulse (!) 110   Temp 99.3 F (37.4 C) (Oral)   Resp (!) 21   Ht 6' (1.829 m)   Wt 102.5 kg (225 lb 15.5 oz)   SpO2 100%   BMI 30.65 kg/m   HEMODYNAMICS:    VENTILATOR SETTINGS: Vent Mode: PRVC FiO2 (%):  [40 %] 40 % Set Rate:  [15 bmp] 15 bmp Vt Set:  [600 mL] 600 mL PEEP:  [5 cmH20] 5 cmH20 Plateau Pressure:  [13 cmH20] 13 cmH20  INTAKE / OUTPUT: I/O last 3 completed shifts: In: 2910.5 [I.V.:2008.4; NG/GT:433.8; IV Piggyback:468.3] Out: 2380 [Urine:2155; Emesis/NG output:50; Stool:175]  PHYSICAL EXAMINATION:  General: Adult male, no distress, lying in bed  HEENT: ETT in place  PULM: Rhonchi bilaterally, labored breathing, no wheeze/crackles  CV: RRR, no MRG  GI: BS+, soft, nontender MSK: -edema  Neuro: Alert, follows commands    LABS:  BMET  Recent Labs Lab 05/20/17 0454 05/21/17 0416 05/22/17 0327  NA 152* 155* 154*  K 2.7* 3.5 4.1  CL 115* 119* 116*  CO2 30 30 30   BUN 36* 23* 26*  CREATININE 0.69 0.65 0.85  GLUCOSE 187* 99 189*    Electrolytes  Recent Labs Lab 05/19/17 0415 05/20/17 0454 05/21/17 0416 05/22/17 0327  CALCIUM 8.5* 7.5* 7.8* 8.2*  MG 2.5* 2.0 2.2  --   PHOS 3.1 1.8* 2.3*  --     CBC  Recent Labs Lab 05/20/17 0454 05/21/17 0416 05/22/17 0327  WBC 11.1* 10.4 13.2*  HGB 15.0 14.8 15.4  HCT 49.1 46.5 49.9  PLT 88* 102* 157    Coag's No results for input(s): APTT, INR in the last 168 hours.  Sepsis Markers No results for input(s): LATICACIDVEN,  PROCALCITON, O2SATVEN in the last 168 hours.  ABG  Recent Labs Lab 05/18/17 0345 05/20/17 0236 05/21/17 2028  PHART 7.428 7.476* 7.488*  PCO2ART 52.4* 45.1 38.2  PO2ART 72.2* 58.0* 70.0*    Liver Enzymes No results for input(s): AST, ALT, ALKPHOS, BILITOT, ALBUMIN in the last 168 hours.  Cardiac Enzymes No results for input(s): TROPONINI, PROBNP in the last 168 hours.  Glucose  Recent Labs Lab 05/21/17 1159 05/21/17 1545 05/21/17 2032 05/21/17 2349 05/22/17 0429 05/22/17 0759  GLUCAP 137* 116* 207* 174* 172* 179*    Imaging Dg Chest Port 1 View  Result Date: 05/22/2017 CLINICAL DATA:  Acute respiratory failure. EXAM: PORTABLE CHEST 1 VIEW COMPARISON:  05/21/2017.  05/17/2017. FINDINGS: Endotracheal tube, NG tube, left PICC line in stable position. Mild bibasilar atelectasis and infiltrates unchanged. Heart size normal. No pneumothorax. IMPRESSION: 1. Lines and tubes in stable position. 2.  Mild by base atelectasis and infiltrates unchanged. Electronically Signed   By: Marcello Moores  Register   On: 05/22/2017 06:31   Dg Chest Port 1 View  Result Date: 05/21/2017 CLINICAL DATA:  Post intubation. EXAM: PORTABLE CHEST 1 VIEW COMPARISON:  05/21/2017 FINDINGS: The patient is intubated. Endotracheal tube in satisfactory radiographic position. Feeding catheter collimated off the image. Left PICC line stable. Cardiomediastinal silhouette is normal. Mediastinal contours  appear intact. There is no evidence of pneumothorax. Low lung volumes with bibasilar peribronchial airspace opacities. Osseous structures are without acute abnormality. Soft tissues are grossly normal. IMPRESSION: Status post intubation. Low lung volumes with bibasilar peribronchial airspace opacities, right greater than left. Electronically Signed   By: Fidela Salisbury M.D.   On: 05/21/2017 22:04   Dg Chest Port 1 View  Result Date: 05/21/2017 CLINICAL DATA:  Acute respiratory failure. EXAM: PORTABLE CHEST 1 VIEW  COMPARISON:  05/20/2017 FINDINGS: The patient is extubated. Feeding catheter is collimated off the image. Left PICC line, stable terminates in the expected location of proximal superior vena cava. Cardiomediastinal silhouette is normal. Mediastinal contours appear intact. There is no evidence of pneumothorax. Stable elevation of the right hemidiaphragm. Peribronchial right subhilar opacities persist. Osseous structures are without acute abnormality. Soft tissues are grossly normal. IMPRESSION: Persistent peribronchial right subhilar airspace opacities. Post extubation. Electronically Signed   By: Fidela Salisbury M.D.   On: 05/21/2017 19:27   Dg Abd Portable 1v  Result Date: 05/21/2017 CLINICAL DATA:  Orogastric tube placement EXAM: PORTABLE ABDOMEN - 1 VIEW COMPARISON:  05/19/2017 FINDINGS: A new enteric tube has been placed alongside the pre-existing tube. Both tubes extend well into the stomach and possibly into the proximal duodenum. IMPRESSION: Enteric tubes extend at least to the distal antrum and possibly into the proximal duodenum. Electronically Signed   By: Andreas Newport M.D.   On: 05/21/2017 22:04     STUDIES:  Bronchoscopy 8/19 > tracheomalacia, left bronchomalacia, thick pus 8/21 CT chest> emphysema upper lobes, thick airways but no bronchiectasis, subsegmental atelectasis  CULTURES: 8/19 sputum culture > MRSA 8/24 blood > no growth  8/24 c diff positive  ANTIBIOTICS: 8/19 IV vanc > 8/29 8/28 Fortaz > off 8/24 PO vanc  SIGNIFICANT EVENTS: 8/19 Intubated  8/28 Extubated  8/29 Re-intubated   LINES/TUBES: 8/19 ETT > 8/28 ETT 8/29 >>   DISCUSSION: 51 y/o male with COPD exacerbation, MRSA tracheobronchitis.  ASSESSMENT / PLAN:  PULMONARY A: Acute Hypoxic Respiratory Distress in setting of MRSA Tracheobronchitis +/- AECOPD Thick mucus/bronchitis Probable aspiration P:   Maintain Saturation >90  Vent Support Wean as tolerated  Trend CXR/ABG  Continue  solumedrol 40 mg BID (started 8/29)  Continue Chest PT  Continue brovana/pulmicort/hypertonic saline  CARDIOVASCULAR A:  No acute events P:  Cardiac Monitoring  Maintain MAP >65  RENAL A:   Hypernatremia Hypophosphatemia P:   Continue D5 50cc/hr and Free water  Trend BMP > Repeat 1700  Replace electrolytes as needed  GASTROINTESTINAL A:   Aspiration risk P:   NPO PPI  Continue TF   HEMATOLOGIC A:   No acute issues P:  Trend CBC   INFECTIOUS A:   MRSA tracheobronchitis C.Diff Positive  P:   Trend WBC and Fever Curve  Continue Oral Vancomycin (Day 6)   ENDOCRINE A:   Hyperglycemia  P:   Trend Glucose  SSI   NEUROLOGIC A:   Seizure disorder Anxiety P:   RASS goal 0/-1 Wean Propofol gtt to achieve RASS  PRN Fentanyl  Continue Vimpat and Depakote and gabapentin    FAMILY  - Updates: No family at bedside  CC Time: 32 minutes   Hayden Pedro, AGACNP-BC [facility]  Pgr: [phone]  PCCM Pgr: [phone]

## 2017-05-22 NOTE — Progress Notes (Signed)
*  PRELIMINARY RESULTS* Vascular Ultrasound Left upper extremity venous duplex has been completed.  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  Other visualized deep and superficial veins appear negative for thrombosis.  Preliminary results given to patients nurse at bedside.  Everrett Coombe 05/22/2017, 4:04 PM

## 2017-05-23 ENCOUNTER — Inpatient Hospital Stay (HOSPITAL_COMMUNITY): Payer: Medicare Other

## 2017-05-23 LAB — GLUCOSE, CAPILLARY
GLUCOSE-CAPILLARY: 210 mg/dL — AB (ref 65–99)
GLUCOSE-CAPILLARY: 200 mg/dL — AB (ref 65–99)
GLUCOSE-CAPILLARY: 147 mg/dL — AB (ref 65–99)
GLUCOSE-CAPILLARY: 121 mg/dL — AB (ref 65–99)
Glucose-Capillary: 135 mg/dL — ABNORMAL HIGH (ref 65–99)
Glucose-Capillary: 141 mg/dL — ABNORMAL HIGH (ref 65–99)

## 2017-05-23 LAB — PROTIME-INR
INR: 1.07
PROTHROMBIN TIME: 13.8 s (ref 11.4–15.2)

## 2017-05-23 LAB — BASIC METABOLIC PANEL
ANION GAP: 7 (ref 5–15)
BUN: 22 mg/dL — ABNORMAL HIGH (ref 6–20)
CALCIUM: 8 mg/dL — AB (ref 8.9–10.3)
CHLORIDE: 108 mmol/L (ref 101–111)
CO2: 30 mmol/L (ref 22–32)
CREATININE: 0.7 mg/dL (ref 0.61–1.24)
GFR calc non Af Amer: >60 mL/min (ref >60)
Glucose, Bld: 210 mg/dL — ABNORMAL HIGH (ref 65–99)
Potassium: 4 mmol/L (ref 3.5–5.1)
Sodium: 145 mmol/L (ref 135–145)

## 2017-05-23 LAB — CBC
HCT: 45.8 % (ref 39.0–52.0)
HEMOGLOBIN: 13.8 g/dL (ref 13.0–17.0)
MCH: 32 pg (ref 26.0–34.0)
MCHC: 30.1 g/dL (ref 30.0–36.0)
MCV: 106.3 fL — ABNORMAL HIGH (ref 78.0–100.0)
PLATELETS: 151 10*3/uL (ref 150–400)
RBC: 4.31 MIL/uL (ref 4.22–5.81)
RDW: 14.2 % (ref 11.5–15.5)
WBC: 9 10*3/uL (ref 4.0–10.5)

## 2017-05-23 LAB — MAGNESIUM: MAGNESIUM: 2.3 mg/dL (ref 1.7–2.4)

## 2017-05-23 LAB — APTT: APTT: 22 s — AB (ref 24–36)

## 2017-05-23 LAB — PHOSPHORUS: Phosphorus: 3.4 mg/dL (ref 2.5–4.6)

## 2017-05-23 MED ORDER — MIDAZOLAM HCL 2 MG/2ML IJ SOLN
4.0000 mg | Freq: Once | INTRAMUSCULAR | Status: AC
  Administered 2017-05-23: 4 mg via INTRAVENOUS

## 2017-05-23 MED ORDER — FENTANYL CITRATE (PF) 100 MCG/2ML IJ SOLN: 100.0000 ug | Freq: Once | INTRAMUSCULAR | Status: AC

## 2017-05-23 MED ORDER — FENTANYL CITRATE (PF) 100 MCG/2ML IJ SOLN
INTRAMUSCULAR | Status: AC
  Administered 2017-05-23: 100 ug
  Filled 2017-05-23: qty 2

## 2017-05-23 MED ORDER — INSULIN ASPART 100 UNIT/ML ~~LOC~~ SOLN
0.0000 [IU] | SUBCUTANEOUS | Status: DC
  Administered 2017-05-23 (×3): 2 [IU] via SUBCUTANEOUS
  Administered 2017-05-23: 3 [IU] via SUBCUTANEOUS
  Administered 2017-05-24 (×4): 2 [IU] via SUBCUTANEOUS
  Administered 2017-05-25 (×2): 3 [IU] via SUBCUTANEOUS
  Administered 2017-05-25 – 2017-05-26 (×2): 2 [IU] via SUBCUTANEOUS
  Administered 2017-05-26: 3 [IU] via SUBCUTANEOUS
  Administered 2017-05-26: 2 [IU] via SUBCUTANEOUS
  Administered 2017-05-27: 3 [IU] via SUBCUTANEOUS

## 2017-05-23 MED ORDER — HEPARIN BOLUS VIA INFUSION
6000.0000 [IU] | Freq: Once | INTRAVENOUS | Status: AC
  Administered 2017-05-23: 6000 [IU] via INTRAVENOUS
  Filled 2017-05-23: qty 6000

## 2017-05-23 MED ORDER — ROCURONIUM BROMIDE 50 MG/5ML IV SOLN
50.0000 mg | Freq: Once | INTRAVENOUS | Status: AC
  Administered 2017-05-23: 50 mg via INTRAVENOUS

## 2017-05-23 MED ORDER — HEPARIN (PORCINE) IN NACL 100-0.45 UNIT/ML-% IJ SOLN
1750.0000 [IU]/h | INTRAMUSCULAR | Status: DC
  Administered 2017-05-23 – 2017-05-24 (×2): 1700 [IU]/h via INTRAVENOUS
  Administered 2017-05-24 – 2017-05-27 (×4): 1750 [IU]/h via INTRAVENOUS
  Filled 2017-05-23 (×13): qty 250

## 2017-05-23 MED ORDER — ETOMIDATE 2 MG/ML IV SOLN
40.0000 mg | Freq: Once | INTRAVENOUS | Status: AC
  Administered 2017-05-23: 40 mg via INTRAVENOUS

## 2017-05-23 MED ORDER — INSULIN ASPART 100 UNIT/ML ~~LOC~~ SOLN
4.0000 [IU] | Freq: Four times a day (QID) | SUBCUTANEOUS | Status: DC
  Administered 2017-05-23 (×2): 4 [IU] via SUBCUTANEOUS

## 2017-05-23 MED ORDER — ORAL CARE MOUTH RINSE
15.0000 mL | Freq: Four times a day (QID) | OROMUCOSAL | Status: DC
  Administered 2017-05-24 – 2017-05-26 (×10): 15 mL via OROMUCOSAL

## 2017-05-23 MED ORDER — FENTANYL CITRATE (PF) 100 MCG/2ML IJ SOLN
100.0000 ug | Freq: Once | INTRAMUSCULAR | Status: AC
  Administered 2017-05-23: 100 ug via INTRAVENOUS

## 2017-05-23 MED ORDER — MIDAZOLAM HCL 2 MG/2ML IJ SOLN
INTRAMUSCULAR | Status: AC
  Administered 2017-05-23: 2 mg
  Filled 2017-05-23: qty 4

## 2017-05-23 MED ORDER — METHYLPREDNISOLONE SODIUM SUCC 40 MG IJ SOLR
40.0000 mg | Freq: Every day | INTRAMUSCULAR | Status: DC
  Administered 2017-05-24: 40 mg via INTRAVENOUS
  Filled 2017-05-23 (×2): qty 1

## 2017-05-23 MED ORDER — CHLORHEXIDINE GLUCONATE 0.12% ORAL RINSE (MEDLINE KIT)
15.0000 mL | Freq: Two times a day (BID) | OROMUCOSAL | Status: DC
  Administered 2017-05-24 – 2017-05-26 (×3): 15 mL via OROMUCOSAL

## 2017-05-23 MED ORDER — FENTANYL 2500MCG IN NS 250ML (10MCG/ML) PREMIX INFUSION
25.0000 ug/h | INTRAVENOUS | Status: DC
Start: 2017-05-23 — End: 2017-05-25
  Administered 2017-05-23: 50 ug/h via INTRAVENOUS
  Administered 2017-05-24: 250 ug/h via INTRAVENOUS
  Administered 2017-05-24: 300 ug/h via INTRAVENOUS
  Filled 2017-05-23 (×3): qty 250

## 2017-05-23 NOTE — Procedures (Signed)
Name:  [name] MRN:  [id number] DOB:  1966/07/21  OPERATIVE NOTE  Procedure:  Percutaneous tracheostomy.  Indications:  Ventilator-dependent respiratory failure.  Consent:  Procedure, alternatives, risks and benefits discussed with medical POA.  Questions answered.  Consent obtained.  Anesthesia:  Versed, fent, roc, prop, etomidate  Procedure summary:  Appropriate equipment was assembled.  The patient was identified as Harold Bailey and safety timeout was performed. The patient was placed in supine position with a towel roll behind shoulder blades and neck extended.  Sterile technique was used. The patient's neck and upper chest were prepped using chlorhexidine / alcohol scrub and the field was draped in usual sterile fashion with full body drape. After the adequate sedation / anesthesia was achieved, attention was directed at the midline trachea, where the cricothyroid membrane was palpated. Approximately two fingerbreadths above the sternal notch, a verticle incision was created with a scalpel after local infiltration with 0.2% Lidocaine. Noted very superficial small vein ligated easily with silk 1 sutures. Then, using Seldinger technique and a percutaneous tracheostomy set, the trachea was entered with a 14 gauge needle with an overlying sheath. This was all confirmed under direct visualization of a fiberoptic flexible bronchoscope. Entrance into the trachea was identified through the third tracheal ring interspace. Following this, a guidewire was inserted. The needle was removed, leaving the sheath and the guidewire intact. Next, the sheath was removed and a small dilator was inserted. The tracheal rings were then dilated. A #6 Shiley was then opened. The balloon was checked. It was placed over a tracheal dilator, which was then advanced over the guidewire and through the previously dilated tract. The Shiley tracheostomy tube was noted to pass in the trachea with little resistance.  The guidewire and dilator tubes were removed from the trachea. An inner cannula was placed through the tracheostomy tube. The tracheostomy was then secured at the anterior neck with 4 monofilament sutures. The oral endotracheal tube was removed and the ventilator was attached to the newly placed tracheostomy tube. Adequate tidal volumes were noted. The cuff was inflated and no evidence of air leak was noted. No evidence of bleeding was noted. At this point, the procedure was concluded. Post-procedure chest x-ray was ordered.  Complications:  No immediate complications were noted.  Hemodynamic parameters and oxygenation remained stable throughout the procedure.  Estimated blood loss:  Less then 15 mL.  Raylene Miyamoto., MD [facility] [place] Pager: [phone]  05/23/2017, 10:16 AM   Should follow with [facility] tr[facility] [phone]

## 2017-05-23 NOTE — Progress Notes (Signed)
Inpatient Diabetes Program Recommendations  AACE/ADA: New Consensus Statement on Inpatient Glycemic Control (2015)  Target Ranges:  Prepandial:   less than 140 mg/dL      Peak postprandial:   less than 180 mg/dL (1-2 hours)      Critically ill patients:  140 - 180 mg/dL   Lab Results  Component Value Date   GLUCAP 200 (H) 05/23/2017   HGBA1C 5.9 (H) 11/22/2013    Review of Glycemic ControlResults for BRODERIC, BARA (MRN [id number]) as of 05/23/2017 09:07  Ref. Range 05/22/2017 15:56 05/22/2017 20:00 05/22/2017 23:30 05/23/2017 03:30 05/23/2017 08:03  Glucose-Capillary Latest Ref Range: 65 - 99 mg/dL 282 (H) 208 (H) 216 (H) 210 (H) 200 (H)   Diabetes history: Pre-diabetes Outpatient Diabetes medications: None Current orders for Inpatient glycemic control:  Novolog moderate q 4 hours, Novolog 4 units q 4 hours  Inpatient Diabetes Program Recommendations:   Agree with orders.    Thanks, Adah Perl, RN, BC-ADM Inpatient Diabetes Coordinator Pager [phone] (8a-5p)

## 2017-05-23 NOTE — Progress Notes (Signed)
PULMONARY / CRITICAL CARE MEDICINE   Name: Harold Bailey MRN: [id number] DOB: 1966-04-19    ADMISSION DATE:  05/10/2017 CONSULTATION DATE:  05/11/2017  REFERRING MD:  Loleta Books  CHIEF COMPLAINT:  Dyspnea  Brief:   51 y/o male with COPD/asthma admitted for a flare of the same with MRSA bronchitis causing acute rspiratory failure necessitating intubation.   SUBJECTIVE:  No acute events overnight  Planning for tracheostomy today  VITAL SIGNS: BP 99/74 (BP Location: Right Arm)   Pulse 88   Temp 98.4 F (36.9 C) (Oral)   Resp 17   Ht 6' (1.829 m)   Wt 104.7 kg (230 lb 13.2 oz)   SpO2 99%   BMI 31.31 kg/m   HEMODYNAMICS:    VENTILATOR SETTINGS: Vent Mode: PRVC FiO2 (%):  [40 %] 40 % Set Rate:  [15 bmp] 15 bmp Vt Set:  [620 mL] 620 mL PEEP:  [5 cmH20] 5 cmH20 Plateau Pressure:  [12 cmH20-16 cmH20] 12 cmH20  INTAKE / OUTPUT: I/O last 3 completed shifts: In: 6042.8 [I.V.:4294; Other:10; NG/GT:1423.8; IV Piggyback:315] Out: 0630 [Urine:1487; Emesis/NG output:50]  PHYSICAL EXAMINATION:  General:  In bed on vent HENT: NCAT ETT in place PULM: rhonchi R base B, vent supported breathing CV: RRR, no mgr GI: BS+, soft, nontender MSK: normal bulk and tone Neuro: sedated on vent, arouses easily and follows commands     LABS:  BMET  Recent Labs Lab 05/22/17 0327 05/22/17 2000 05/23/17 0350  NA 154* 146* 145  K 4.1 3.6 4.0  CL 116* 110 108  CO2 30 31 30   BUN 26* 24* 22*  CREATININE 0.85 0.77 0.70  GLUCOSE 189* 236* 210*    Electrolytes  Recent Labs Lab 05/20/17 0454 05/21/17 0416 05/22/17 0327 05/22/17 2000 05/23/17 0350  CALCIUM 7.5* 7.8* 8.2* 7.9* 8.0*  MG 2.0 2.2  --   --  2.3  PHOS 1.8* 2.3*  --   --  3.4    CBC  Recent Labs Lab 05/21/17 0416 05/22/17 0327 05/23/17 0350  WBC 10.4 13.2* 9.0  HGB 14.8 15.4 13.8  HCT 46.5 49.9 45.8  PLT 102* 157 151    Coag's  Recent Labs Lab 05/23/17 0350  APTT 22*  INR 1.07    Sepsis  Markers No results for input(s): LATICACIDVEN, PROCALCITON, O2SATVEN in the last 168 hours.  ABG  Recent Labs Lab 05/18/17 0345 05/20/17 0236 05/21/17 2028  PHART 7.428 7.476* 7.488*  PCO2ART 52.4* 45.1 38.2  PO2ART 72.2* 58.0* 70.0*    Liver Enzymes No results for input(s): AST, ALT, ALKPHOS, BILITOT, ALBUMIN in the last 168 hours.  Cardiac Enzymes No results for input(s): TROPONINI, PROBNP in the last 168 hours.  Glucose  Recent Labs Lab 05/22/17 1116 05/22/17 1556 05/22/17 2000 05/22/17 2330 05/23/17 0330 05/23/17 0803  GLUCAP 170* 282* 208* 216* 210* 200*    Imaging Dg Chest Port 1 View  Result Date: 05/23/2017 CLINICAL DATA:  Intubation. EXAM: PORTABLE CHEST 1 VIEW COMPARISON:  05/22/2017. FINDINGS: Endotracheal tube, NG tube, left PICC line stable position. Heart size stable. Mild bibasilar atelectasis/ infiltrates unchanged. Small left pleural effusion. No pneumothorax. IMPRESSION: 1. Lines and tubes in stable position. 2.  Mild bibasilar atelectasis/ infiltrates unchanged. Electronically Signed   By: Marcello Moores  Register   On: 05/23/2017 06:29     STUDIES:  Bronchoscopy 8/19 > tracheomalacia, left bronchomalacia, thick pus 8/21 CT chest> emphysema upper lobes, thick airways but no bronchiectasis, subsegmental atelectasis  CULTURES: 8/19 sputum culture > MRSA 8/24  blood > no growth  8/24 c diff positive  ANTIBIOTICS: 8/19 IV vanc > 8/29 8/28 Fortaz > off 8/24 PO vanc  SIGNIFICANT EVENTS: 8/19 Intubated  8/28 Extubated  8/29 Re-intubated   LINES/TUBES: 8/19 ETT > 8/28 ETT 8/29 >>   DISCUSSION: 51 y/o male with COPD exacerbation, MRSA tracheobronchitis.  ASSESSMENT / PLAN:  PULMONARY A: Acute Hypoxic Respiratory Distress in setting of MRSA Tracheobronchitis +/- AECOPD Thick mucus/bronchitis Probable aspiration P:   Wean solumedrol to 20mg  IV daily Full mechanical vent support VAP prevention Daily WUA/SBT Continue bronchodilators as  ordered Plan tracheostomy today   CARDIOVASCULAR A:  No acute events P:  Tele Monitor hemodynamics  RENAL A:   Hypernatremia Hypophosphatemia P:   Stop D5 infusion Continue free water via tube Monitor BMET and UOP Replace electrolytes as needed  GASTROINTESTINAL A:   Aspiration risk P:   Continue tube feeding Continue pantoprazole  HEMATOLOGIC A:   No acute issues P:  Monitor for bleeding  INFECTIOUS A:   MRSA tracheobronchitis C.Diff Positive  P:   Monitor for fever Continue Oral Vancomycin for 10 days  ENDOCRINE A:   Hyperglycemia  P:   Monitor glucose, SSI Add tube feeding coverage with regular insulin  NEUROLOGIC A:   Seizure disorder Anxiety P:   RASS 0 to -1 PAD protocol fentanyl and propofol    FAMILY  - Updates: wife updated bedside by me on 8/31  CC Time: 31 minutes   Roselie Awkward, MD [place] [facility] Pager: [phone] Cell: [phone] After 3pm or if no response, call [phone]

## 2017-05-23 NOTE — Progress Notes (Signed)
Family informed and educated about procedure. Consent in the chart. Tracheostomy done at the bedside. Pt tolerated well. VS stable

## 2017-05-23 NOTE — Progress Notes (Signed)
PT Cancellation Note  Patient Details Name: Harold Bailey MRN: [id number] DOB: Dec 18, 1965   Cancelled Treatment:    Reason Eval/Treat Not Completed: Medical issues which prohibited therapy. Pt sedated on vent and getting trach. Will follow up on readiness on Monday.   [place] 05/23/2017, 10:07 AM Suanne Marker PT [phone]

## 2017-05-23 NOTE — Progress Notes (Signed)
ANTICOAGULATION CONSULT NOTE - Initial Consult  Pharmacy Consult for Heparin  Indication: upper extremity DVT  Allergies  Allergen Reactions  . Hydrocodone Rash  . No Known Allergies     Patient Measurements: Height: 6' (182.9 cm) Weight: 230 lb 13.2 oz (104.7 kg) IBW/kg (Calculated) : 77.6 Heparin Dosing Weight: 99.3  Vital Signs: Temp: 98.7 F (37.1 C) (08/31 1152) Temp Source: Oral (08/31 1152) BP: 112/78 (08/31 1200) Pulse Rate: 75 (08/31 1200)  Labs:  Recent Labs  05/21/17 0416 05/22/17 0327 05/22/17 2000 05/23/17 0350  HGB 14.8 15.4  --  13.8  HCT 46.5 49.9  --  45.8  PLT 102* 157  --  151  APTT  --   --   --  22*  LABPROT  --   --   --  13.8  INR  --   --   --  1.07  CREATININE 0.65 0.85 0.77 0.70    Estimated Creatinine Clearance: 135.1 mL/min (by C-G formula based on SCr of 0.7 mg/dL).   Medical History: Past Medical History:  Diagnosis Date  . Anxiety   . Arthritis    Left ankle  . COPD (chronic obstructive pulmonary disease) ([place])   . Drug abuse   . Gait disturbance   . GERD (gastroesophageal reflux disease)   . 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  Marland Kitchen Hyperlipidemia   . Hypertension   . 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 Memory loss   . Peripheral vision loss    Left due to brain surgery  . Pneumonia   . Polycythemia   . Prediabetes   . Seizures ([place]) 11/19/2013   none since 2016  . Shortness of breath    Hx of smoking.     Assessment: 51 y.o. male starting on heparin infusion today after doppler of upper extremity shows DVT in left subclavian. The PICC line was removed from left arm today and a new line was placed in the right arm. Patient also had a tracheostomy, due to this will hold start of heparin until 1900 on 05/23/2017 per the CCM team. Hemoglobin and platelets are stable at this time.    Goal of Therapy:  Heparin level 0.3-0.7 units/ml Monitor  platelets by anticoagulation protocol: Yes   Plan:  Give 6000 units bolus x 1 Start heparin infusion at 1700 units/hr Check anti-Xa level in 6 hours and daily while on heparin Continue to monitor H&H and platelets  Jalene Mullet, Pharm.D. PGY1 Pharmacy Resident 05/23/2017 1:33 PM Main Pharmacy: [phone]

## 2017-05-23 NOTE — Progress Notes (Signed)
[facility] Progress Note Patient Name: JAIRON RIPBERGER DOB: 04-13-1966 MRN: [id number]   Date of Service  05/23/2017  HPI/Events of Note  CBgs better  eICU Interventions  Dc standing insulin orders & use SSI scale     Intervention Category Intermediate Interventions: Hyperglycemia - evaluation and treatment  ALVA,RAKESH V. 05/23/2017, 11:55 PM

## 2017-05-23 NOTE — Progress Notes (Signed)
eLink Physician-Brief Progress Note Patient Name: Harold Bailey DOB: October 07, 1965 MRN: [id number]   Date of Service  05/23/2017  HPI/Events of Note  Nurse reports patient agitation reaching for tracheostomy despite propofol drip. Currently on mechanical ventilation. Fentanyl helps to relieve patient's discomfort/agitation but only briefly.   eICU Interventions  Restarting fentanyl drip with titration goal      Intervention Category Major Interventions: Delirium, psychosis, severe agitation - evaluation and management  Tera Partridge 05/23/2017, 8:04 PM

## 2017-05-24 ENCOUNTER — Inpatient Hospital Stay (HOSPITAL_COMMUNITY): Payer: Medicare Other

## 2017-05-24 LAB — CBC WITH DIFFERENTIAL/PLATELET
BASOS ABS: 0 10*3/uL (ref 0.0–0.1)
BASOS PCT: 0 %
EOS ABS: 0.1 10*3/uL (ref 0.0–0.7)
Eosinophils Relative: 1 %
HCT: 44.3 % (ref 39.0–52.0)
HEMOGLOBIN: 13.8 g/dL (ref 13.0–17.0)
LYMPHS ABS: 2.7 10*3/uL (ref 0.7–4.0)
Lymphocytes Relative: 26 %
MCH: 32.2 pg (ref 26.0–34.0)
MCHC: 31.2 g/dL (ref 30.0–36.0)
MCV: 103.3 fL — ABNORMAL HIGH (ref 78.0–100.0)
Monocytes Absolute: 0.7 10*3/uL (ref 0.1–1.0)
Monocytes Relative: 7 %
NEUTROS PCT: 66 %
Neutro Abs: 6.9 10*3/uL (ref 1.7–7.7)
Platelets: 142 10*3/uL — ABNORMAL LOW (ref 150–400)
RBC: 4.29 MIL/uL (ref 4.22–5.81)
RDW: 13.9 % (ref 11.5–15.5)
WBC: 10.4 10*3/uL (ref 4.0–10.5)

## 2017-05-24 LAB — TRIGLYCERIDES: TRIGLYCERIDES: 219 mg/dL — AB (ref <150)

## 2017-05-24 LAB — BASIC METABOLIC PANEL
ANION GAP: 3 — AB (ref 5–15)
BUN: 20 mg/dL (ref 6–20)
CHLORIDE: 108 mmol/L (ref 101–111)
CO2: 33 mmol/L — ABNORMAL HIGH (ref 22–32)
CREATININE: 0.57 mg/dL — AB (ref 0.61–1.24)
Calcium: 8 mg/dL — ABNORMAL LOW (ref 8.9–10.3)
GFR calc non Af Amer: >60 mL/min (ref >60)
Glucose, Bld: 133 mg/dL — ABNORMAL HIGH (ref 65–99)
Potassium: 3.7 mmol/L (ref 3.5–5.1)
SODIUM: 144 mmol/L (ref 135–145)

## 2017-05-24 LAB — GLUCOSE, CAPILLARY
GLUCOSE-CAPILLARY: 145 mg/dL — AB (ref 65–99)
GLUCOSE-CAPILLARY: 134 mg/dL — AB (ref 65–99)
GLUCOSE-CAPILLARY: 104 mg/dL — AB (ref 65–99)
Glucose-Capillary: 106 mg/dL — ABNORMAL HIGH (ref 65–99)
Glucose-Capillary: 116 mg/dL — ABNORMAL HIGH (ref 65–99)
Glucose-Capillary: 129 mg/dL — ABNORMAL HIGH (ref 65–99)

## 2017-05-24 LAB — HEPARIN LEVEL (UNFRACTIONATED)
HEPARIN UNFRACTIONATED: 0.3 [IU]/mL (ref 0.30–0.70)
Heparin Unfractionated: 0.4 IU/mL (ref 0.30–0.70)

## 2017-05-24 MED ORDER — FENTANYL CITRATE (PF) 100 MCG/2ML IJ SOLN
25.0000 ug | INTRAMUSCULAR | Status: DC | PRN
  Administered 2017-05-25: 100 ug via INTRAVENOUS
  Administered 2017-05-26 (×2): 50 ug via INTRAVENOUS
  Administered 2017-05-26 – 2017-05-27 (×6): 100 ug via INTRAVENOUS
  Filled 2017-05-24 (×10): qty 2

## 2017-05-24 MED ORDER — FENTANYL 50 MCG/HR TD PT72
75.0000 ug | MEDICATED_PATCH | TRANSDERMAL | Status: DC
  Administered 2017-05-24 – 2017-05-27 (×2): 75 ug via TRANSDERMAL
  Filled 2017-05-24 (×2): qty 1

## 2017-05-24 MED ORDER — VECURONIUM BROMIDE 10 MG IV SOLR: 10.0000 mg | Freq: Once | INTRAVENOUS | Status: AC

## 2017-05-24 MED ORDER — PROPOFOL 500 MG/50ML IV EMUL: 5.0000 ug/kg/min | Freq: Once | INTRAVENOUS | Status: AC

## 2017-05-24 MED ORDER — FENTANYL CITRATE (PF) 100 MCG/2ML IJ SOLN: 200.0000 ug | Freq: Once | INTRAMUSCULAR | Status: AC

## 2017-05-24 MED ORDER — MIDAZOLAM HCL 2 MG/2ML IJ SOLN: 4.0000 mg | Freq: Once | INTRAMUSCULAR | Status: AC

## 2017-05-24 MED ORDER — ETOMIDATE 2 MG/ML IV SOLN: 40.0000 mg | Freq: Once | INTRAVENOUS | Status: AC

## 2017-05-24 NOTE — Progress Notes (Signed)
PULMONARY / CRITICAL CARE MEDICINE   Name: Harold Bailey MRN: [id number] DOB: [birth year]    ADMISSION DATE:  05/10/2017 CONSULTATION DATE:  05/11/2017  REFERRING MD:  Loleta Books  CHIEF COMPLAINT:  Dyspnea  Brief:   51 y/o male with COPD/asthma admitted for a flare of the same with MRSA bronchitis causing acute rspiratory failure necessitating intubation.   SUBJECTIVE:  Tracheostomy yesterday, went well Now on trach collar  VITAL SIGNS: BP 140/76   Pulse (!) 101   Temp 98.4 F (36.9 C) (Oral)   Resp (!) 24   Ht 6' (1.829 m)   Wt 107.7 kg (237 lb 7 oz)   SpO2 95%   BMI 32.20 kg/m   HEMODYNAMICS:    VENTILATOR SETTINGS: Vent Mode: Spontaneous FiO2 (%):  [40 %] 40 % Set Rate:  [15 bmp] 15 bmp Vt Set:  [657 mL] 620 mL PEEP:  [5 cmH20] 5 cmH20 Pressure Support:  [10 cmH20] 10 cmH20 Plateau Pressure:  [9 cmH20] 9 cmH20  INTAKE / OUTPUT: I/O last 3 completed shifts: In: 4931.5 [I.V.:2921.9; NG/GT:1589.7; IV Piggyback:420] Out: 1910 [Urine:1910]  PHYSICAL EXAMINATION:  General:  In bed, off vent, breathing comfortably HENT: NCAT trach in place, no bleeding PULM: rhonchi bilaterally, normal effort CV: RRR, no mgr GI: BS+, soft, nontender MSK: normal bulk and tone Neuro: awake on vent, no distress     LABS:  BMET  Recent Labs Lab 05/22/17 2000 05/23/17 0350 05/24/17 0227  NA 146* 145 144  K 3.6 4.0 3.7  CL 110 108 108  CO2 31 30 33*  BUN 24* 22* 20  CREATININE 0.77 0.70 0.57*  GLUCOSE 236* 210* 133*    Electrolytes  Recent Labs Lab 05/20/17 0454 05/21/17 0416  05/22/17 2000 05/23/17 0350 05/24/17 0227  CALCIUM 7.5* 7.8*  < > 7.9* 8.0* 8.0*  MG 2.0 2.2  --   --  2.3  --   PHOS 1.8* 2.3*  --   --  3.4  --   < > = values in this interval not displayed.  CBC  Recent Labs Lab 05/22/17 0327 05/23/17 0350 05/24/17 0227  WBC 13.2* 9.0 10.4  HGB 15.4 13.8 13.8  HCT 49.9 45.8 44.3  PLT 157 151 142*    Coag's  Recent Labs Lab  05/23/17 0350  APTT 22*  INR 1.07    Sepsis Markers No results for input(s): LATICACIDVEN, PROCALCITON, O2SATVEN in the last 168 hours.  ABG  Recent Labs Lab 05/18/17 0345 05/20/17 0236 05/21/17 2028  PHART 7.428 7.476* 7.488*  PCO2ART 52.4* 45.1 38.2  PO2ART 72.2* 58.0* 70.0*    Liver Enzymes No results for input(s): AST, ALT, ALKPHOS, BILITOT, ALBUMIN in the last 168 hours.  Cardiac Enzymes No results for input(s): TROPONINI, PROBNP in the last 168 hours.  Glucose  Recent Labs Lab 05/23/17 1652 05/23/17 2030 05/23/17 2346 05/24/17 0335 05/24/17 0810 05/24/17 1159  GLUCAP 141* 121* 135* 145* 106* 129*    Imaging Dg Chest Port 1 View  Result Date: 05/24/2017 CLINICAL DATA:  Acute respiratory failure with hypoxemia. EXAM: PORTABLE CHEST 1 VIEW COMPARISON:  One-view chest x-ray 05/23/2017 FINDINGS: The heart size is exaggerated by low lung volumes. Tracheostomy tube is stable. A small bore feeding tube is in place. The tip is in the fundus of the stomach. Bibasilar airspace disease is again noted with slight increase. There is slight increase in a diffuse interstitial pattern is well. Small bilateral pleural effusions are suspected. IMPRESSION: 1. Slight increase in diffuse interstitial  pattern mile basilar airspace disease, likely reflecting edema and atelectasis. Infection is considered less likely. 2. Tracheostomy tube is stable. 3. The tip of the small bore feeding tube is in the fundus of the stomach. Electronically Signed   By: San Morelle M.D.   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  ASSESSMENT / PLAN:  PULMONARY A: Acute Hypoxic Respiratory Distress in setting of MRSA Tracheobronchitis +/- AECOPD Thick mucus/bronchitis Probable aspiration P:   Continue solumedrol Continue hypertonic saline Continue bronchodilators as ordered Trach collar trial> OK to go all day today if possible   CARDIOVASCULAR A:  No acute events P:  Tele Monitor hemodynamics  RENAL A:   No acute issues P:   Monitor BMET and UOP Replace electrolytes as needed  GASTROINTESTINAL A:   Aspiration risk P:   Continue tube feeding pantoprazole  HEMATOLOGIC A:   No acute issues P:  Monitor for bleeding  INFECTIOUS A:   MRSA tracheobronchitis C.Diff Positive  P:   Monitor for fever Oral vanc for 10 days  ENDOCRINE A:   Hyperglycemia  P:   Monitor glucose, SSI   NEUROLOGIC A:   Seizure disorder Anxiety Pain, narcotic dependence P:   RASS 0 to -1 Stop fentanyl drip Stop propofol Fentanyl patch Prn fentanyl    FAMILY  - Updates: wife updated by me on 8/31  CC Time: 32 minutes   Roselie Awkward, MD [place] [facility] Pager: [phone] Cell: [phone] After 3pm or if no response, call [phone]

## 2017-05-24 NOTE — Progress Notes (Signed)
ANTICOAGULATION CONSULT NOTE - Follow Up Consult  Pharmacy Consult for Heparin  Indication: DVT, upper extremity   Allergies  Allergen Reactions  . Hydrocodone Rash  . No Known Allergies     Patient Measurements: Height: 6' (182.9 cm) Weight: 230 lb 13.2 oz (104.7 kg) IBW/kg (Calculated) : 77.6  Vital Signs: Temp: 98.3 F (36.8 C) (08/31 2348) Temp Source: Oral (08/31 2348) BP: 101/69 (09/01 0100) Pulse Rate: 93 (09/01 0100)  Labs:  Recent Labs  05/22/17 0327 05/22/17 2000 05/23/17 0350 05/24/17 0227  HGB 15.4  --  13.8 13.8  HCT 49.9  --  45.8 44.3  PLT 157  --  151 142*  APTT  --   --  22*  --   LABPROT  --   --  13.8  --   INR  --   --  1.07  --   HEPARINUNFRC  --   --   --  0.40  CREATININE 0.85 0.77 0.70  --     Estimated Creatinine Clearance: 135.1 mL/min (by C-G formula based on SCr of 0.7 mg/dL).   Assessment: 51 y.o. male starting on heparin infusion 8/31 after doppler of upper extremity shows DVT in left subclavian. The PICC line was removed from left arm 8/31 and a new line was placed in the right arm. Initial heparin level this AM is therapeutic    Goal of Therapy:  Heparin level 0.3-0.7 units/ml Monitor platelets by anticoagulation protocol: Yes   Plan:  -Cont heparin at 1700 units/hr -1000 HL  Narda Bonds 05/24/2017,3:07 AM

## 2017-05-24 NOTE — Progress Notes (Signed)
Patient placed back on ventilator, SpO2 dropping, and patient seemed worn out. Tolerating ventilator well at this time. RT will continue to monitor.

## 2017-05-24 NOTE — Progress Notes (Signed)
ANTICOAGULATION CONSULT NOTE - Follow Up Consult  Pharmacy Consult for Heparin  Indication: DVT, upper extremity   Allergies  Allergen Reactions  . Hydrocodone Rash  . No Known Allergies     Patient Measurements: Height: 6' (182.9 cm) Weight: 237 lb 7 oz (107.7 kg) IBW/kg (Calculated) : 77.6  Vital Signs: Temp: 98.4 F (36.9 C) (09/01 1157) Temp Source: Oral (09/01 1157) BP: 140/76 (09/01 1103) Pulse Rate: 101 (09/01 1103)  Labs:  Recent Labs  05/22/17 0327 05/22/17 2000 05/23/17 0350 05/24/17 0227 05/24/17 1021  HGB 15.4  --  13.8 13.8  --   HCT 49.9  --  45.8 44.3  --   PLT 157  --  151 142*  --   APTT  --   --  22*  --   --   LABPROT  --   --  13.8  --   --   INR  --   --  1.07  --   --   HEPARINUNFRC  --   --   --  0.40 0.30  CREATININE 0.85 0.77 0.70 0.57*  --     Estimated Creatinine Clearance: 136.9 mL/min (A) (by C-G formula based on SCr of 0.57 mg/dL (L)).   Assessment: 51 y.o. male starting on heparin infusion 8/31 after doppler of upper extremity shows DVT in left subclavian. The PICC line was removed from left arm 8/31 and a new line was placed in the right arm. Currently on IV heparin at 1700 units/hr. Repeat heparin level remains therapeutic but has trended down to lower limit of normal.   H/H stable and wnl. Plt 142K  Goal of Therapy:  Heparin level 0.3-0.7 units/ml Monitor platelets by anticoagulation protocol: Yes   Plan:  -Increase heparin to 1750 units/hr -Monitor daily HL, CBC and s/s of bleeding   Albertina Parr, PharmD., BCPS Clinical Pharmacist Pager [phone]

## 2017-05-25 ENCOUNTER — Inpatient Hospital Stay (HOSPITAL_COMMUNITY): Payer: Medicare Other

## 2017-05-25 DIAGNOSIS — I739 Peripheral vascular disease, unspecified: Secondary | ICD-10-CM

## 2017-05-25 DIAGNOSIS — Z789 Other specified health status: Secondary | ICD-10-CM

## 2017-05-25 DIAGNOSIS — M79609 Pain in unspecified limb: Secondary | ICD-10-CM

## 2017-05-25 LAB — CBC
HEMATOCRIT: 46.3 % (ref 39.0–52.0)
Hemoglobin: 14.9 g/dL (ref 13.0–17.0)
MCH: 33.3 pg (ref 26.0–34.0)
MCHC: 32.2 g/dL (ref 30.0–36.0)
MCV: 103.3 fL — AB (ref 78.0–100.0)
PLATELETS: 143 10*3/uL — AB (ref 150–400)
RBC: 4.48 MIL/uL (ref 4.22–5.81)
RDW: 13.7 % (ref 11.5–15.5)
WBC: 12.7 10*3/uL — ABNORMAL HIGH (ref 4.0–10.5)

## 2017-05-25 LAB — VAS US LOWER EXTREMITY ARTERIAL DUPLEX
RIGHT POST TIB DIST SYS: 8 cm/s
RIGHT POST TIB MID SYS: 9 cm/s
RPOPDPSV: -12 cm/s
RPOPPPSV: -43 cm/s
RSFMPSV: -49 cm/s
RSFPPSV: 47 cm/s
Right post tibial sys PSV: 44 cm/s
Right super femoral dist sys PSV: -33 cm/s

## 2017-05-25 LAB — BASIC METABOLIC PANEL
ANION GAP: 7 (ref 5–15)
BUN: 15 mg/dL (ref 6–20)
CALCIUM: 7.9 mg/dL — AB (ref 8.9–10.3)
CO2: 29 mmol/L (ref 22–32)
CREATININE: 0.45 mg/dL — AB (ref 0.61–1.24)
Chloride: 108 mmol/L (ref 101–111)
GFR calc non Af Amer: >60 mL/min (ref >60)
GLUCOSE: 132 mg/dL — AB (ref 65–99)
Potassium: 3.5 mmol/L (ref 3.5–5.1)
Sodium: 144 mmol/L (ref 135–145)

## 2017-05-25 LAB — GLUCOSE, CAPILLARY
GLUCOSE-CAPILLARY: 170 mg/dL — AB (ref 65–99)
GLUCOSE-CAPILLARY: 134 mg/dL — AB (ref 65–99)
Glucose-Capillary: 119 mg/dL — ABNORMAL HIGH (ref 65–99)
Glucose-Capillary: 122 mg/dL — ABNORMAL HIGH (ref 65–99)
Glucose-Capillary: 123 mg/dL — ABNORMAL HIGH (ref 65–99)
Glucose-Capillary: 175 mg/dL — ABNORMAL HIGH (ref 65–99)

## 2017-05-25 LAB — HEPARIN LEVEL (UNFRACTIONATED)

## 2017-05-25 MED ORDER — ASPIRIN 81 MG PO CHEW
81.0000 mg | CHEWABLE_TABLET | Freq: Every day | ORAL | Status: DC
  Administered 2017-05-25 – 2017-05-27 (×3): 81 mg
  Filled 2017-05-25 (×3): qty 1

## 2017-05-25 MED ORDER — POTASSIUM CHLORIDE 20 MEQ/15ML (10%) PO SOLN
20.0000 meq | ORAL | Status: AC
  Administered 2017-05-25: 20 meq
  Filled 2017-05-25: qty 15

## 2017-05-25 MED ORDER — PREDNISONE 20 MG PO TABS
40.0000 mg | ORAL_TABLET | Freq: Every day | ORAL | Status: DC
  Administered 2017-05-25 – 2017-05-27 (×3): 40 mg
  Filled 2017-05-25 (×3): qty 2

## 2017-05-25 NOTE — Progress Notes (Signed)
Notified Marni Griffon NP of need for IV access. States to place a line for med needs.  RN aware.

## 2017-05-25 NOTE — Progress Notes (Signed)
PULMONARY / CRITICAL CARE MEDICINE   Name: Harold Bailey MRN: [id number] DOB: [birth year]    ADMISSION DATE:  05/10/2017 CONSULTATION DATE:  05/11/2017  REFERRING MD:  Loleta Books  CHIEF COMPLAINT:  Dyspnea  Brief:   51 y/o male with COPD/asthma admitted for a flare of the same with MRSA bronchitis causing acute rspiratory failure necessitating intubation.   SUBJECTIVE:  Reports that his left foot is "cold" feels numb and hurts.   VITAL SIGNS: BP 103/77   Pulse (!) 108   Temp 98.8 F (37.1 C) (Oral)   Resp (!) 27   Ht 6' (1.829 m)   Wt 237 lb 7 oz (107.7 kg)   SpO2 92%   BMI 32.20 kg/m   HEMODYNAMICS:    VENTILATOR SETTINGS: Vent Mode: PSV FiO2 (%):  [40 %-98 %] 98 % Set Rate:  [15 bmp] 15 bmp Vt Set:  [620 mL] 620 mL PEEP:  [5 cmH20] 5 cmH20 Pressure Support:  [10 cmH20] 10 cmH20 Plateau Pressure:  [15 cmH20-22 cmH20] 22 cmH20  INTAKE / OUTPUT: I/O last 3 completed shifts: In: 3900.3 [I.V.:1615.3; NG/GT:1960; IV Piggyback:325] Out: 2375 [Urine:2375]  PHYSICAL EXAMINATION:  General appearance:  51 Year old  Male, well nourished, restless, no distress  Eyes: anicteric sclerae, moist conjunctivae; PERRL, EOMI bilaterally. Mouth:  membranes and no mucosal ulcerations; normal hard and soft palate Neck: Trachea midline; neck supple, no JVD, # 6 cuffed trach Lungs/chest: scattered rhonchi, with normal respiratory effort and no intercostal retractions CV: RRR, no MRGs  Abdomen: Soft, non-tender; no masses or HSM Extremities: No peripheral sig edema or extremity lymphadenopathy, does have cold left foot w/ paresthesia  Skin: Normal temperature (w/ exception of above) turgor and texture; no rash, ulcers or subcutaneous nodules Psych: Appropriate affect, alert and oriented to person, place and time  LABS:  BMET  Recent Labs Lab 05/23/17 0350 05/24/17 0227 05/25/17 0502  NA 145 144 144  K 4.0 3.7 3.5  CL 108 108 108  CO2 30 33* 29  BUN 22* 20 15   CREATININE 0.70 0.57* 0.45*  GLUCOSE 210* 133* 132*    Electrolytes  Recent Labs Lab 05/20/17 0454 05/21/17 0416  05/23/17 0350 05/24/17 0227 05/25/17 0502  CALCIUM 7.5* 7.8*  < > 8.0* 8.0* 7.9*  MG 2.0 2.2  --  2.3  --   --   PHOS 1.8* 2.3*  --  3.4  --   --   < > = values in this interval not displayed.  CBC  Recent Labs Lab 05/22/17 0327 05/23/17 0350 05/24/17 0227  WBC 13.2* 9.0 10.4  HGB 15.4 13.8 13.8  HCT 49.9 45.8 44.3  PLT 157 151 142*    Coag's  Recent Labs Lab 05/23/17 0350  APTT 22*  INR 1.07    Sepsis Markers No results for input(s): LATICACIDVEN, PROCALCITON, O2SATVEN in the last 168 hours.  ABG  Recent Labs Lab 05/20/17 0236 05/21/17 2028  PHART 7.476* 7.488*  PCO2ART 45.1 38.2  PO2ART 58.0* 70.0*    Liver Enzymes No results for input(s): AST, ALT, ALKPHOS, BILITOT, ALBUMIN in the last 168 hours.  Cardiac Enzymes No results for input(s): TROPONINI, PROBNP in the last 168 hours.  Glucose  Recent Labs Lab 05/24/17 1159 05/24/17 1539 05/24/17 2010 05/24/17 2359 05/25/17 0358 05/25/17 0828  GLUCAP 129* 134* 104* 116* 119* 175*    Imaging No results found.   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 -doing well.  -looks comfortable on ATC -has sig c/o left foot pain and paresthesia   ASSESSMENT / PLAN:  Acute Hypoxic Respiratory Distress in setting of MRSA Tracheobronchitis +/-  AECOPD Thick mucus/bronchitis Probable aspiration pcxr 9/1-->basilar airspace disease. Trach intact.  Plan:   ATC as tolerated Cont HT saline nebs Cont BDs Solumedrol   Aspiration risk Inadequate oral intake d/t acute illness Plan Cont Tubefeeds Cont PPI    LUE DVT Plan Cont heparin gtt Restrict LUE for access and labs  Left foot pain & paresthesia and temp change Plan Arterial US duplex Hep gtt May need vascular   C.Diff Positive  Plan   Trend fever Oral vanc x 10d   Hyperglycemia  Plan ssi    Seizure disorder Anxiety Pain, narcotic dependence Plan RASS goal 0 to -1 fent Patch PRN fent     FAMILY  - Updates: wife updated by me on 8/31  cct 34 min  Erick Colace ACNP-BC [place] Pager # [phone] OR # [phone] if no answer

## 2017-05-25 NOTE — Progress Notes (Signed)
[facility] ADULT ICU REPLACEMENT PROTOCOL FOR AM LAB REPLACEMENT ONLY  The patient does apply for the [facility] Adult ICU Electrolyte Replacment Protocol based on the criteria listed below:   1. Is GFR >/= 40 ml/min? Yes.    Patient's GFR today is >60 2. Is urine output >/= 0.5 ml/kg/hr for the last 6 hours? Yes.   Patient's UOP is 0.5 ml/kg/hr 3. Is BUN < 60 mg/dL? Yes.    Patient's BUN today is 15 4. Abnormal electrolyte(s): K+3.5 5. Ordered repletion with: Protocol 6. If a panic level lab has been reported, has the CCM MD in charge been notified? No..   Physician:  Oneita Jolly, Eddie Dibbles [place] 05/25/2017 6:20 AM

## 2017-05-25 NOTE — Progress Notes (Signed)
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  Reconstitution noted in the popliteal.  Incidentally, there is acute, non occlusive DVT noted in the left popliteal vein.      RIGHT    LEFT    PRESSURE WAVEFORM  PRESSURE WAVEFORM  BRACHIAL 117 T BRACHIAL Restricted/ DVT   DP 63 DM DP  inaudible  AT   AT  inaudible  PT 62 DM PT  inaudible  PER   PER    GREAT TOE absent NA GREAT TOE absent NA    RIGHT LEFT  ABI 0.54      Asley Baskerville, RVT 05/25/2017, 4:59 PM

## 2017-05-25 NOTE — Procedures (Signed)
Central Venous Catheter Insertion Procedure Note Harold Bailey [id number] 06-20-1966  Procedure: Insertion of Central Venous Catheter Indications: Drug and/or fluid administration and Frequent blood sampling  Procedure Details Consent: Risks of procedure as well as the alternatives and risks of each were explained to the (patient/caregiver).  Consent for procedure obtained. Time Out: Verified patient identification, verified procedure, site/side was marked, verified correct patient position, special equipment/implants available, medications/allergies/relevent history reviewed, required imaging and test results available.  Performed Real time [place] used to ID and cannulate vessel   Maximum sterile technique was used including antiseptics, cap, gloves, gown, hand hygiene, mask and sheet. Skin prep: Chlorhexidine; local anesthetic administered A antimicrobial bonded/coated triple lumen catheter was placed in the right internal jugular vein using the Seldinger technique.  Evaluation Blood flow good Complications: No apparent complications Patient did tolerate procedure well. Chest X-ray ordered to verify placement.  CXR: pending.  Clementeen Graham 05/25/2017, 3:23 PM  Erick Colace ACNP-BC [place] Pager # [phone] OR # [phone] if no answer

## 2017-05-25 NOTE — Progress Notes (Signed)
ANTICOAGULATION CONSULT NOTE - Follow Up Consult  Pharmacy Consult for Heparin  Indication: DVT, upper extremity   Allergies  Allergen Reactions  . Hydrocodone Rash  . No Known Allergies     Patient Measurements: Height: 6' (182.9 cm) Weight: 237 lb 7 oz (107.7 kg) IBW/kg (Calculated) : 77.6  Vital Signs: Temp: 98.9 F (37.2 C) (09/02 1146) Temp Source: Oral (09/02 1146) BP: 108/83 (09/02 1200) Pulse Rate: 98 (09/02 1200)  Labs:  Recent Labs  05/23/17 0350 05/24/17 0227 05/24/17 1021 05/25/17 0502  HGB 13.8 13.8  --   --   HCT 45.8 44.3  --   --   PLT 151 142*  --   --   APTT 22*  --   --   --   LABPROT 13.8  --   --   --   INR 1.07  --   --   --   HEPARINUNFRC  --  0.40 0.30 >2.20*  CREATININE 0.70 0.57*  --  0.45*    Estimated Creatinine Clearance: 136.9 mL/min (A) (by C-G formula based on SCr of 0.45 mg/dL (L)).   Assessment: 51 y.o. male starting on heparin infusion 8/31 after doppler of upper extremity shows DVT in left subclavian. The PICC line was removed from left arm 8/31 and a new line was placed in the right arm. On IV heparin at 1750 units/hr this AM with HL > 2.2. Likely drawn inappropriately. Heparin was stopped this AM for central line placement.  H/H stable and wnl. Plt 142K  Goal of Therapy:  Heparin level 0.3-0.7 units/ml Monitor platelets by anticoagulation protocol: Yes   Plan:  -Resume IV heparin after central line placement  -F/u 6 hour HL  -Monitor daily HL, CBC and s/s of bleeding   Albertina Parr, PharmD., BCPS Clinical Pharmacist Pager [phone]

## 2017-05-25 NOTE — Consult Note (Addendum)
Hospital Consult    Reason for Consult:  Cold left leg Requesting team:  PCCM Salvadore Dom) MRN #:  [id number]  History of Present Illness: This is a 51 y.o. male admitted with mrsa bronchitis. He is now trached and more alert and awake and notices coolness to his left foot. He tells me this has happened before. He is diabetic and a smoker. No previous vascular surgery but does have left ankle surgery in the past. His left foot does not hurt at this time and he can move it without limitation and it feels normal when touched. Heparin is on for left arm dvt but has been held for placement of central venous line.   Past Medical History:  Diagnosis Date  . Anxiety   . Arthritis    Left ankle  . COPD (chronic obstructive pulmonary disease) ([place])   . Drug abuse   . Gait disturbance   . GERD (gastroesophageal reflux disease)   . 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  Marland Kitchen Hyperlipidemia   . Hypertension   . 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 Memory loss   . Peripheral vision loss    Left due to brain surgery  . Pneumonia   . Polycythemia   . Prediabetes   . Seizures ([place]) 11/19/2013   none since 2016  . Shortness of breath    Hx of smoking.    Past Surgical History:  Procedure Laterality Date  . ANKLE ARTHROSCOPY  01/17/2012   Procedure: ANKLE ARTHROSCOPY;  Surgeon: [name], MD;  Location: [place];  Service: Orthopedics;  Laterality: Left;  . ANKLE ARTHROSCOPY WITH FUSION Left 10/02/2016   Procedure: Left Posterior Subtalar Arthrodesis Arthroscopy;  Surgeon: [name], MD;  Location: [place];  Service: Orthopedics;  Laterality: Left;  . ANKLE FUSION Left 11/2012   Dr Sharol Given  . ANKLE SURGERY  2003   Left  . BRAIN SURGERY  2009   Biopsy  . FRACTURE SURGERY  2003   Left ankle  . HARDWARE REMOVAL Left 12/08/2012   Procedure: HARDWARE REMOVAL;  Surgeon: [name], MD;  Location: [place];  Service:  Orthopedics;  Laterality: Left;  Removal Deep Hardware, Take Down Non-Union, Revision Internal Fixation Left Ankle  . I&D EXTREMITY Left 01/01/2013   Procedure: IRRIGATION AND DEBRIDEMENT EXTREMITY;  Surgeon: [name], MD;  Location: [place];  Service: Orthopedics;  Laterality: Left;  Irrigation, Debridement and Placement Antibiotic Beads Left Ankle  . INNER EAR SURGERY    . OPEN REDUCTION INTERNAL FIXATION (ORIF) SCAPHOID WITH DISTAL RADIUS GRAFT Right 02/01/2013   Procedure: OPEN REDUCTION INTERNAL FIXATION (ORIF) RIGHT SCAPHOID FRACTURE WITH DISTAL RADIUS GRAFT;  Surgeon: Schuyler Amor, MD;  Location: [place];  Service: Orthopedics;  Laterality: Right;  . ORIF ANKLE FRACTURE Left 12/08/2012   Procedure: OPEN REDUCTION INTERNAL FIXATION (ORIF) ANKLE FRACTURE;  Surgeon: [name], MD;  Location: [place];  Service: Orthopedics;  Laterality: Left;  Removal Deep Hardware, Take Down Non-Union, Revision Internal Fixation Left Ankle  . ORIF ANKLE FRACTURE Right 09/03/2013   Procedure: OPEN REDUCTION INTERNAL FIXATION (ORIF) ANKLE FRACTURE;  Surgeon: [name], MD;  Location: [place];  Service: Orthopedics;  Laterality: Right;  Open Reduction Internal Fixation Right Fibula  . SCAPHOID FRACTURE SURGERY  02/04/2013  . SHOULDER ARTHROSCOPY  2008   Left  . [place]  Allergies  Allergen Reactions  . Hydrocodone Rash  . No Known Allergies     Prior to Admission medications   Medication Sig Start Date End Date Taking? Authorizing Provider  acetaminophen (TYLENOL) 500 MG tablet Take 1,500 mg by mouth every 6 (six) hours as needed for mild pain.    Yes [provider]  albuterol (PROVENTIL HFA;VENTOLIN HFA) 108 (90 Base) MCG/ACT inhaler Inhale 2 puffs into the lungs every 4 (four) hours as needed for wheezing or shortness of breath (cough, shortness of breath or wheezing.). 11/07/16  Yes Lauree Chandler, NP  ALPRAZolam Duanne Moron) 1 MG tablet Take 1 mg by mouth at  bedtime as needed for anxiety.   Yes [provider]  buPROPion (ZYBAN) 150 MG 12 hr tablet TAKE 1 TABLET(150 MG) BY MOUTH TWICE DAILY 02/05/17  Yes Dondra Prader R, NP  cyclobenzaprine (FLEXERIL) 10 MG tablet Take 10 mg by mouth 3 (three) times daily as needed for muscle spasms.   Yes [provider]  diclofenac sodium (VOLTAREN) 1 % GEL APPLY 2 TO 4 GRAMS EXTERNALLY TO THE AFFECTED AREA TWICE DAILY 01/07/17  Yes [name], MD  divalproex (DEPAKOTE ER) 500 MG 24 hr tablet Take 2 tablets (1,000 mg total) by mouth daily. 11/22/16  Yes Tomi Likens, Adam R, DO  docusate sodium (COLACE) 100 MG capsule Take 100 mg by mouth 2 (two) times daily as needed for mild constipation.    Yes [provider]  Fluticasone-Salmeterol (ADVAIR DISKUS) 250-50 MCG/DOSE AEPB Inhale 1 puff into the lungs 2 (two) times daily. 11/07/16  Yes Lauree Chandler, NP  gabapentin (NEURONTIN) 300 MG capsule TAKE 1 CAPSULE(300 MG) BY MOUTH THREE TIMES DAILY Patient taking differently: TAKE 1 CAPSULE(300 MG) BY MOUTH TWICE DAILY 04/11/17  Yes Dondra Prader R, NP  Lacosamide (VIMPAT) 100 MG TABS Take 1 tablet (100 mg total) by mouth 2 (two) times daily. 11/22/16  Yes Jaffe, Adam R, DO  lisinopril-hydrochlorothiazide (PRINZIDE,ZESTORETIC) 20-25 MG tablet TAKE 1 TABLET BY MOUTH DAILY   Yes [provider]  meloxicam (MOBIC) 15 MG tablet Take 15 mg by mouth daily.   Yes [provider]  methocarbamol (ROBAXIN) 500 MG tablet TAKE 500MG  BY MOUTH EVERY 6 TO 8 HOURS AS NEEDED FOR MUSCLE SPASMS   Yes [provider]  Multiple Vitamin (MULTIVITAMIN WITH MINERALS) TABS tablet Take 1 tablet by mouth daily. 07/04/16  Yes Johnson, Clanford L, MD  nabumetone (RELAFEN) 500 MG tablet Take 1 tablet (500 mg total) by mouth 2 (two) times daily as needed. 03/28/17  Yes [name], MD  naproxen (NAPROSYN) 500 MG tablet TAKE 1 TABLET(500 MG) BY MOUTH TWICE DAILY WITH MEALS AS NEEDED FOR MILD PAIN OR MODERATE PAIN  11/15/16  Yes Dondra Prader R, NP  traZODone (DESYREL) 50 MG tablet Take 1/2 tablet by mouth twice daily as needed for anxiety. Stop Date 11/23/16 11/07/16 05/10/17 Yes Lauree Chandler, NP    Social History   Social History  . Marital status: Married    Spouse name: Vickie  . Number of children: 1  . Years of education: 37   Occupational History  .  Not Employed    Disabled   Social History Main Topics  . Smoking status: Current Every Day Smoker    Packs/day: 0.25    Years: 30.00    Types: Cigarettes  . Smokeless tobacco: Never Used  . Alcohol use No     Comment: quit Oct. 2017(heavy drinker) .In [place]  .  Drug use: No  . Sexual activity: Not Currently   Other Topics Concern  . Not on file   Social History Narrative   Patient lives at home with his wife Maxwell Caul)    Disabled   Left handed   Education 12 th    Caffeine Coffee four cups     Family History  Problem Relation Age of Onset  . Hypertension Mother   . Bronchitis Mother   . Cancer Maternal Aunt   . Cancer Maternal Grandmother   . Stroke Neg Hx   . Diabetes Neg Hx    REVIEW OF SYSTEMS (negative unless checked):   Cardiac:  []  Chest pain or chest pressure? []  Shortness of breath upon activity? []  Shortness of breath when lying flat? []  Irregular heart rhythm?  Vascular:  []  Pain in calf, thigh, or hip brought on by walking? []  Pain in feet at night that wakes you up from your sleep? [x]  Blood clot in your veins? [x]  Leg swelling?  Pulmonary:  []  Oxygen at home? []  Productive cough? []  Wheezing?  Neurologic:  []  Sudden weakness in arms or legs? []  Sudden numbness in arms or legs? []  Sudden onset of difficult speaking or slurred speech? []  Temporary loss of vision in one eye? []  Problems with dizziness?  Gastrointestinal:  []  Blood in stool? [x]  diarrhea  Genitourinary:  []  Burning when urinating? []  Blood in urine?  Psychiatric:  []  Major depression  Hematologic:  []  Bleeding  problems? []  Problems with blood clotting?  Dermatologic:  []  Rashes or ulcers?  Constitutional:  []  Fever or chills?  Ear/Nose/Throat:  []  Change in hearing? []  Nose bleeds? []  Sore throat?  Musculoskeletal:  []  Back pain? []  Joint pain? []  Muscle pain?    Physical Examination  Vitals:   05/25/17 1000 05/25/17 1146  BP: 125/83   Pulse: (!) 110   Resp: 17   Temp:  98.9 F (37.2 C)  SpO2: 98%    Body mass index is 32.2 kg/m.  General:  Awake and alert, nad HENT: WNL, normocephalic Pulmonary: normal non-labored breathing, trached Cardiac: tachycardic 2+ femoral pulses Non palpable popliteal pulses Pt signals are monophasic bilaterally, no dp or peroneal signals Abdomen:soft, no masses Extremities: right foot is warm, cap refill <2s Left foot is cool to touch particularly at the level of the toes, cap refill is sluggish, there are 2 well healed incisions from ankle surgery Neurologic: both feet have normal sensation and motor strength Psychiatric:  Appropriate mood and affect  CBC    Component Value Date/Time   WBC 10.4 05/24/2017 0227   RBC 4.29 05/24/2017 0227   HGB 13.8 05/24/2017 0227   HCT 44.3 05/24/2017 0227   PLT 142 (L) 05/24/2017 0227   MCV 103.3 (H) 05/24/2017 0227   MCH 32.2 05/24/2017 0227   MCHC 31.2 05/24/2017 0227   RDW 13.9 05/24/2017 0227   LYMPHSABS 2.7 05/24/2017 0227   MONOABS 0.7 05/24/2017 0227   EOSABS 0.1 05/24/2017 0227   BASOSABS 0.0 05/24/2017 0227    BMET    Component Value Date/Time   NA 144 05/25/2017 0502   K 3.5 05/25/2017 0502   CL 108 05/25/2017 0502   CO2 29 05/25/2017 0502   GLUCOSE 132 (H) 05/25/2017 0502   BUN 15 05/25/2017 0502   CREATININE 0.45 (L) 05/25/2017 0502   CREATININE 1.06 11/22/2013 1723   CALCIUM 7.9 (L) 05/25/2017 0502   GFRNONAA >60 05/25/2017 0502   GFRNONAA 82 11/22/2013 1723   GFRAA >60  05/25/2017 0502   GFRAA >89 11/22/2013 1723    COAGS: Lab Results  Component Value Date    INR 1.07 05/23/2017   INR 0.98 09/03/2013   INR 2.11 (H) 02/05/2013     Non-Invasive Vascular Imaging:   Summary: Findings consistent with subacute deep vein thrombosis involving the subclavian, axillary and brachial veins of the left upper extremity.   ASSESSMENT/PLAN: 51 y.o. male here with bronchitis now complicated by C. difficile and now has tracheostomy in place. He is on heparin for left upper extremity DVT. This is currently held for central venous line placement which should be resumed after that. He should also be on aspirin given that he likely has significant vascular disease with history of smoking and diabetes. His left foot is cool to touch but his sensory and motor intact at this time and there is a signal at the ankle posterior tibial artery which is consistent with the contralateral exam. From this standpoint he does not appear to have any limb threatening acute limb ischemia. We will get left lower extremity duplex bilateral ABIs. He will need at least angiography during this hospitalization to evaluate his left lower extremity. Should he have physical exam change this can be done on a more urgent basis. Will continue to follow.  Zymier Rodgers C. Donzetta Matters, MD [facility] Office: [phone] Pager: [phone]   Addendum:  He has an incidentally found left leg dvt, already on systemic anticoagulation for left arm dvt and unlikely contributing to symptoms. The LLE duplex demonstrates collateral flow consistent with a chronic process although he has minimal flow in his foot consistent with physical exam. Given lack of neuro symptoms in his foot will continue to monitor and plan for elective angiogram prior to discharge.   Aaniya Sterba C. Donzetta Matters, MD

## 2017-05-25 NOTE — Progress Notes (Signed)
eLink Physician-Brief Progress Note Patient Name: Harold Bailey DOB: 11/09/1965 MRN: [id number]   Date of Service  05/25/2017  HPI/Events of Note  DVT noted in left popliteal vein. Currently on heparin drip.   eICU Interventions  Continuing systemic anticoagulation with heparin drip.      Intervention Category Major Interventions: Other:  Tera Partridge 05/25/2017, 6:56 PM

## 2017-05-26 LAB — CBC
HEMATOCRIT: 45.3 % (ref 39.0–52.0)
HEMOGLOBIN: 14.3 g/dL (ref 13.0–17.0)
MCH: 32.6 pg (ref 26.0–34.0)
MCHC: 31.6 g/dL (ref 30.0–36.0)
MCV: 103.4 fL — ABNORMAL HIGH (ref 78.0–100.0)
Platelets: 142 10*3/uL — ABNORMAL LOW (ref 150–400)
RBC: 4.38 MIL/uL (ref 4.22–5.81)
RDW: 13.8 % (ref 11.5–15.5)
WBC: 12.2 10*3/uL — AB (ref 4.0–10.5)

## 2017-05-26 LAB — GLUCOSE, CAPILLARY
GLUCOSE-CAPILLARY: 96 mg/dL (ref 65–99)
GLUCOSE-CAPILLARY: 164 mg/dL — AB (ref 65–99)
GLUCOSE-CAPILLARY: 127 mg/dL — AB (ref 65–99)
Glucose-Capillary: 107 mg/dL — ABNORMAL HIGH (ref 65–99)

## 2017-05-26 LAB — BASIC METABOLIC PANEL
Anion gap: 7 (ref 5–15)
BUN: 17 mg/dL (ref 6–20)
CALCIUM: 7.8 mg/dL — AB (ref 8.9–10.3)
CO2: 31 mmol/L (ref 22–32)
Chloride: 108 mmol/L (ref 101–111)
Creatinine, Ser: 0.48 mg/dL — ABNORMAL LOW (ref 0.61–1.24)
GFR calc Af Amer: >60 mL/min (ref >60)
GFR calc non Af Amer: >60 mL/min (ref >60)
GLUCOSE: 95 mg/dL (ref 65–99)
Potassium: 3 mmol/L — ABNORMAL LOW (ref 3.5–5.1)
SODIUM: 146 mmol/L — AB (ref 135–145)

## 2017-05-26 LAB — HEPARIN LEVEL (UNFRACTIONATED)
Heparin Unfractionated: 0.37 IU/mL (ref 0.30–0.70)
Heparin Unfractionated: 0.48 IU/mL (ref 0.30–0.70)

## 2017-05-26 MED ORDER — POTASSIUM CHLORIDE 20 MEQ/15ML (10%) PO SOLN
40.0000 meq | Freq: Once | ORAL | Status: AC
  Administered 2017-05-26: 40 meq
  Filled 2017-05-26: qty 30

## 2017-05-26 NOTE — Progress Notes (Signed)
ANTICOAGULATION CONSULT NOTE - Follow Up Consult  Pharmacy Consult for Heparin  Indication: DVT, upper/lower extremity   Allergies  Allergen Reactions  . Hydrocodone Rash  . No Known Allergies     Patient Measurements: Height: 6' (182.9 cm) Weight: 237 lb 7 oz (107.7 kg) IBW/kg (Calculated) : 77.6  Vital Signs: Temp: 99.6 F (37.6 C) (09/03 0352) Temp Source: Oral (09/02 2349) BP: 93/65 (09/03 0310) Pulse Rate: 93 (09/03 0310)  Labs:  Recent Labs  05/24/17 0227 05/24/17 1021 05/25/17 0502 05/25/17 1725 05/26/17 0400  HGB 13.8  --   --  14.9 14.3  HCT 44.3  --   --  46.3 45.3  PLT 142*  --   --  143* 142*  HEPARINUNFRC 0.40 0.30 >2.20*  --  0.37  CREATININE 0.57*  --  0.45*  --   --     Estimated Creatinine Clearance: 136.9 mL/min (A) (by C-G formula based on SCr of 0.45 mg/dL (L)).   Assessment: 51 y.o. male starting on heparin infusion 8/31 after doppler of upper extremity shows DVT in left subclavian. The PICC line was removed from left arm 8/31 and a new line was placed in the right arm. 9/2 pt also found to have LLE DVT. Heparin level is therapeutic x 1 after re-start.   Goal of Therapy:  Heparin level 0.3-0.7 units/ml Monitor platelets by anticoagulation protocol: Yes   Plan:  -Cont heparin 1750 units/hr -1200 HL  Narda Bonds 05/26/2017,4:42 AM

## 2017-05-26 NOTE — Evaluation (Signed)
Physical Therapy Evaluation Patient Details Name: Harold Bailey MRN: [id number] DOB: 09-Dec-1965 Today's Date: 05/26/2017   History of Present Illness  Harold Bailey is a 51 y.o. male with a past medical history significant for anxiety, asthma/COPD, smoking, SVT, seizures, memory issue for benign brain tumor, and Bipolar and substance abuse who presents with dyspnea found to COPD exacerbation with hypercarbic and hypoxic respiratory failure with MRSA bronchitis causing acute respiratory failure necessitating intubation, trached 05/23/17 also with LUE and LLE DVT  Clinical Impression  Pt pleasant and eager to move. Pt wanting to know if he can go home today despite being on trach collar and nocturnal vent. Pt with decreased strength, function, balance, activity tolerance and cardiopulmonary function who will benefit from acute therapy to maximize mobility, gait, balance and independence to decrease fall risk and burden of care. Pt encouraged to mobilize daily with nursing assist.   SpO2 85-93% on 55% trach collar    Follow Up Recommendations SNF;Supervision/Assistance - 24 hour    Equipment Recommendations  3in1 (PT)    Recommendations for Other Services       Precautions / Restrictions Precautions Precautions: Fall Precaution Comments: trach, cortrak Restrictions Weight Bearing Restrictions: No      Mobility  Bed Mobility Overal bed mobility: Needs Assistance Bed Mobility: Supine to Sit     Supine to sit: Min assist     General bed mobility comments: HOB 30 degrees with use of rail, min assist to complete trunk elevation, moves quickly  Transfers Overall transfer level: Needs assistance Equipment used: Rolling walker (2 wheeled) Transfers: Sit to/from [facility] Sit to Stand: Min assist;+2 safety/equipment Stand pivot transfers: Min assist;+2 safety/equipment       General transfer comment: cues for safety and lines with pt standing from bed,  pivot to [employer] and cues for hand placement with transfers  Ambulation/Gait Ambulation/Gait assistance: Min assist;+2 safety/equipment Ambulation Distance (Feet): 20 Feet Assistive device: Rolling walker (2 wheeled) Gait Pattern/deviations: Step-through pattern;Decreased stride length;Trunk flexed   Gait velocity interpretation: Below normal speed for age/gender General Gait Details: cues for posture, position in RW and activity regulation as pt SpO2 dropped to 85% on 55% trach collar with gait, chair follow with return to room seated  Stairs            Wheelchair Mobility    Modified Rankin (Stroke Patients Only)       Balance Overall balance assessment: Needs assistance Sitting-balance support: Feet supported;No upper extremity supported Sitting balance-Leahy Scale: Fair Sitting balance - Comments: impulsive reaching for items from chair position as moving back into room   Standing balance support: Bilateral upper extremity supported;During functional activity Standing balance-Leahy Scale: Poor                               Pertinent Vitals/Pain Pain Assessment: 0-10 Pain Score: 10-Worst pain ever Faces Pain Scale: Hurts worst Pain Location: first he said his face "like someone had punched him in the face" then when he moved he said is left great toe (both 10s) Pain Descriptors / Indicators: Grimacing;Sore Pain Intervention(s): Limited activity within patient's tolerance;Repositioned;Monitored during session    [name] expects to be discharged to:: Skilled nursing facility Living Arrangements: Spouse/significant other Available Help at Discharge: Family;Available PRN/intermittently Type of Home: House Home Access: Level entry   Entrance Stairs-Number of Steps: 7 Home Layout: Two level Home Equipment: [place] - 2 wheels;Cane - single point  Additional Comments: Pt will stay in basement--level entry but all other living areas upstairs,  ex-wife works during the day    Prior Function Level of Independence: Independent               Hand Dominance   Dominant Hand: Left    Extremity/Trunk Assessment   Upper Extremity Assessment Upper Extremity Assessment: Defer to OT evaluation    Lower Extremity Assessment Lower Extremity Assessment: Generalized weakness    Cervical / Trunk Assessment Cervical / Trunk Assessment: Normal  Communication   Communication: Tracheostomy;Other (comment) (mouthing and writing)  Cognition Arousal/Alertness: Awake/alert Behavior During Therapy: Impulsive Overall Cognitive Status: History of cognitive impairments - at baseline Area of Impairment: Orientation;Following commands;Safety/judgement                 Orientation Level: Disoriented to;Time     Following Commands: Follows one step commands consistently Safety/Judgement: Decreased awareness of safety            General Comments      Exercises     Assessment/Plan    PT Assessment Patient needs continued PT services  PT Problem List Decreased strength;Decreased mobility;Decreased safety awareness;Decreased activity tolerance;Decreased cognition;Cardiopulmonary status limiting activity;Decreased balance;Decreased knowledge of use of DME       PT Treatment Interventions Gait training;Therapeutic exercise;Therapeutic activities;DME instruction;Functional mobility training;Patient/family education;Balance training    PT Goals (Current goals can be found in the Care Plan section)  Acute Rehab PT Goals Patient Stated Goal: return home PT Goal Formulation: With patient Time For Goal Achievement: 06/09/17 Potential to Achieve Goals: Fair    Frequency Min 3X/week   Barriers to discharge Decreased caregiver support      Co-evaluation PT/OT/SLP Co-Evaluation/Treatment: Yes Reason for Co-Treatment: Complexity of the patient's impairments (multi-system involvement);For patient/therapist safety PT goals  addressed during session: Mobility/safety with mobility;Balance;Proper use of DME OT goals addressed during session: ADL's and self-care;Strengthening/ROM       AM-PAC PT "6 Clicks" Daily Activity  Outcome Measure Difficulty turning over in bed (including adjusting bedclothes, sheets and blankets)?: A Little Difficulty moving from lying on back to sitting on the side of the bed? : Unable Difficulty sitting down on and standing up from a chair with arms (e.g., wheelchair, bedside commode, etc,.)?: Unable Help needed moving to and from a bed to chair (including a wheelchair)?: A Little Help needed walking in hospital room?: A Little Help needed climbing 3-5 steps with a railing? : A Lot 6 Click Score: 13    End of Session Equipment Utilized During Treatment: Gait belt;Oxygen Activity Tolerance: Patient tolerated treatment well Patient left: in chair;with chair alarm set;with call bell/phone within reach;with nursing/sitter in room Nurse Communication: Mobility status PT Visit Diagnosis: Other abnormalities of gait and mobility (R26.89);Difficulty in walking, not elsewhere classified (R26.2);Muscle weakness (generalized) (M62.81);History of falling (Z91.81)    Time: 9470-9628 PT Time Calculation (min) (ACUTE ONLY): 37 min   Charges:   PT Evaluation $PT Eval Moderate Complexity: 1 Mod PT Treatments $Therapeutic Activity: 8-22 mins   PT G Codes:        Elwyn Reach, PT [phone]   [place] 05/26/2017, 9:49 AM

## 2017-05-26 NOTE — Progress Notes (Signed)
SLP Cancellation Note  Patient Details Name: Harold Bailey MRN: [id number] DOB: 1966-03-20   Cancelled treatment:       Reason Eval/Treat Not Completed: Medical issues which prohibited therapy (on vent)  Gabriel Rainwater [place], CCC-SLP [phone]  Gabriel Rainwater Meryl 05/26/2017, 8:17 AM

## 2017-05-26 NOTE — Progress Notes (Signed)
[facility] Progress Note Patient Name: Harold Bailey DOB: 1966-03-21 MRN: [id number]   Date of Service  05/26/2017  HPI/Events of Note  K+ = 3.0 and Creatinine = 0.48.  eICU Interventions  Will replace K+.     Intervention Category Major Interventions: Electrolyte abnormality - evaluation and management  Sommer,Steven Eugene 05/26/2017, 4:58 AM

## 2017-05-26 NOTE — Progress Notes (Signed)
PULMONARY / CRITICAL CARE MEDICINE   Name: Harold Bailey MRN: [id number] DOB: 11/26/1965    ADMISSION DATE:  05/10/2017 CONSULTATION DATE:  05/11/2017  REFERRING MD:  Loleta Books  CHIEF COMPLAINT:  Dyspnea  Brief:   51 y/o male with COPD/asthma admitted for a flare of the same with MRSA bronchitis causing acute rspiratory failure necessitating intubation.   SUBJECTIVE:  Seen by vascular for left foot vascular insufficiency yesterday Weans well off of vent  VITAL SIGNS: BP (!) 80/66   Pulse (!) 102   Temp 99 F (37.2 C) (Oral)   Resp 16   Ht 6' (1.829 m)   Wt 102 kg (224 lb 13.9 oz)   SpO2 96%   BMI 30.50 kg/m   HEMODYNAMICS:    VENTILATOR SETTINGS: Vent Mode: PRVC FiO2 (%):  [60 %] 60 % Set Rate:  [15 bmp] 15 bmp Vt Set:  [620 mL] 620 mL PEEP:  [5 cmH20] 5 cmH20 Plateau Pressure:  [18 cmH20-25 cmH20] 25 cmH20  INTAKE / OUTPUT: I/O last 3 completed shifts: In: 2482.5 [I.V.:727.5; NG/GT:1480; IV Piggyback:275] Out: 2510 [Urine:2510]  PHYSICAL EXAMINATION:  General:  Resting comfortably in bed HENT: NCAT trach in place, some drainage PULM: rhonchi bilaterally, normal effort CV: RRR, no mgr GI: BS+, soft, nontender MSK: normal bulk and tone Derm: left foot more warm today, some varicosities noted Neuro: awake, alert, no distress, MAEW      LABS:  BMET  Recent Labs Lab 05/24/17 0227 05/25/17 0502 05/26/17 0400  NA 144 144 146*  K 3.7 3.5 3.0*  CL 108 108 108  CO2 33* 29 31  BUN 20 15 17   CREATININE 0.57* 0.45* 0.48*  GLUCOSE 133* 132* 95    Electrolytes  Recent Labs Lab 05/20/17 0454 05/21/17 0416  05/23/17 0350 05/24/17 0227 05/25/17 0502 05/26/17 0400  CALCIUM 7.5* 7.8*  < > 8.0* 8.0* 7.9* 7.8*  MG 2.0 2.2  --  2.3  --   --   --   PHOS 1.8* 2.3*  --  3.4  --   --   --   < > = values in this interval not displayed.  CBC  Recent Labs Lab 05/24/17 0227 05/25/17 1725 05/26/17 0400  WBC 10.4 12.7* 12.2*  HGB 13.8 14.9 14.3   HCT 44.3 46.3 45.3  PLT 142* 143* 142*    Coag's  Recent Labs Lab 05/23/17 0350  APTT 22*  INR 1.07    Sepsis Markers No results for input(s): LATICACIDVEN, PROCALCITON, O2SATVEN in the last 168 hours.  ABG  Recent Labs Lab 05/20/17 0236 05/21/17 2028  PHART 7.476* 7.488*  PCO2ART 45.1 38.2  PO2ART 58.0* 70.0*    Liver Enzymes No results for input(s): AST, ALT, ALKPHOS, BILITOT, ALBUMIN in the last 168 hours.  Cardiac Enzymes No results for input(s): TROPONINI, PROBNP in the last 168 hours.  Glucose  Recent Labs Lab 05/25/17 1615 05/25/17 1955 05/25/17 2348 05/26/17 0350 05/26/17 0901 05/26/17 1205  GLUCAP 170* 134* 122* 96 107* 164*    Imaging Dg Chest Port 1 View  Result Date: 05/25/2017 CLINICAL DATA:  Encounter for central line placement EXAM: PORTABLE CHEST 1 VIEW COMPARISON:  May 24, 2017 FINDINGS: The heart size and mediastinal contours are within normal limits. Right jugular central venous line is identified with distal tip in the superior vena cava. No pleural line is identified to suggest pneumothorax. There are minimal bilateral pleural effusions and minimal atelectasis of both lung bases. A feeding tube is identified coiled  in the proximal stomach. The visualized skeletal structures are stable. IMPRESSION: Right jugular central venous line with distal tip in the superior vena cava. No pleural line noted to suggest pneumothorax. Minimal bilateral pleural effusions and minimal atelectasis of both lung bases. Electronically Signed   By: Abelardo Diesel M.D.   On: 05/25/2017 15:53     STUDIES:  Bronchoscopy 8/19 > tracheomalacia, left bronchomalacia, thick pus 8/21 CT chest> emphysema upper lobes, thick airways but no bronchiectasis, subsegmental atelectasis 8/30 LUE ultrasound > DVT 9/2 lower extremity doppler: lower extremity DVT noted, left distal femoral artery occluded but collateral flow noted  CULTURES: 8/19 sputum culture > MRSA 8/24  blood > no growth  8/24 c diff positive  ANTIBIOTICS: 8/19 IV vanc > 8/29 8/28 Fortaz > off 8/24 PO vanc  SIGNIFICANT EVENTS: 8/19 Intubated  8/28 Extubated  8/29 Re-intubated  8/31 Tracheostomy  LINES/TUBES: 8/19 ETT > 8/28 ETT 8/29 >> 8/31 Trach 8/31 Titus Mould)  DISCUSSION: 51 y/o male with COPD exacerbation, MRSA tracheobronchitis.  ASSESSMENT / PLAN:  PULMONARY A: Acute Hypoxic Respiratory Distress in setting of MRSA Tracheobronchitis +/- AECOPD Thick mucus/bronchitis Probable aspiration P:   Continue solumedrol Continue bronchodilators Trach collar as long as tolerated   CARDIOVASCULAR A:  Left greater than right lower extremity ischemic changes Left upper extremity DVT Lower lower extremity DVT Left femoral artery stenosis P:  Tele Monitor hemodynamics Vascular appreciated, will need angiogram at some point Continue heparin  RENAL A:   No acute issues P:   Monitor BMET and UOP Replace electrolytes as needed  GASTROINTESTINAL A:   Aspiration risk P:   Continue tube feeding Monitor for aspiration SLP evaluation Few ice chips for mouth dryness   HEMATOLOGIC A:   No acute issues P:  Monitor for bleeding  INFECTIOUS A:   MRSA tracheobronchitis > treated C.Diff Positive  P:   Oral vanc for 10 day course  ENDOCRINE A:   Hyperglycemia  P:   SSI   NEUROLOGIC A:   Seizure disorder Anxiety Pain, narcotic dependence P:   Prn fentanyl    FAMILY  - Updates: wife updated by me on 8/31, mother updated 9/2  To vent SDU bed  Roselie Awkward, MD [facility] Pager: [phone] Cell: [phone] After 3pm or if no response, call [phone]

## 2017-05-26 NOTE — Progress Notes (Signed)
  Progress Note    05/26/2017 2:07 PM * No surgery found *  Subjective:  Left foot feeling better  Vitals:   05/26/17 1315 05/26/17 1400  BP:  92/68  Pulse: (!) 103 99  Resp: 16 12  Temp:    SpO2: 92% 95%    Physical Exam: Awake and alert Left foot is warmer today with cap refill <3s Non discernible signals by doppler in left foot Non tender left foot, sensory and motor in tact  CBC    Component Value Date/Time   WBC 12.2 (H) 05/26/2017 0400   RBC 4.38 05/26/2017 0400   HGB 14.3 05/26/2017 0400   HCT 45.3 05/26/2017 0400   PLT 142 (L) 05/26/2017 0400   MCV 103.4 (H) 05/26/2017 0400   MCH 32.6 05/26/2017 0400   MCHC 31.6 05/26/2017 0400   RDW 13.8 05/26/2017 0400   LYMPHSABS 2.7 05/24/2017 0227   MONOABS 0.7 05/24/2017 0227   EOSABS 0.1 05/24/2017 0227   BASOSABS 0.0 05/24/2017 0227    BMET    Component Value Date/Time   NA 146 (H) 05/26/2017 0400   K 3.0 (L) 05/26/2017 0400   CL 108 05/26/2017 0400   CO2 31 05/26/2017 0400   GLUCOSE 95 05/26/2017 0400   BUN 17 05/26/2017 0400   CREATININE 0.48 (L) 05/26/2017 0400   CREATININE 1.06 11/22/2013 1723   CALCIUM 7.8 (L) 05/26/2017 0400   GFRNONAA >60 05/26/2017 0400   GFRNONAA 82 11/22/2013 1723   GFRAA >60 05/26/2017 0400   GFRAA >89 11/22/2013 1723    INR    Component Value Date/Time   INR 1.07 05/23/2017 0350     Intake/Output Summary (Last 24 hours) at 05/26/17 1407 Last data filed at 05/26/17 1400  Gross per 24 hour  Intake           1557.5 ml  Output             1165 ml  Net            392.5 ml     Assessment:  51 y.o. male is here with mrsa bronchitis s/p trach. Foot is cool but sensorimotor in tact with collaterals noted on duplex  Plan: Will continue to follow. Likely will benefit from angiogram electively prior to discharge. Aspirin and statin when able. anticoagulation for dvt.    Nizar Cutler C. Donzetta Matters, MD [facility] Office: [phone] Pager:  [phone]  05/26/2017 2:07 PM

## 2017-05-26 NOTE — Progress Notes (Signed)
ANTICOAGULATION CONSULT NOTE - Follow Up Consult  Pharmacy Consult for Heparin  Indication: DVT, upper/lower extremity   Allergies  Allergen Reactions  . Hydrocodone Rash  . No Known Allergies     Patient Measurements: Height: 6' (182.9 cm) Weight: 224 lb 13.9 oz (102 kg) IBW/kg (Calculated) : 77.6  Vital Signs: Temp: 99 F (37.2 C) (09/03 1208) Temp Source: Oral (09/03 1208) BP: 113/79 (09/03 1300) Pulse Rate: 103 (09/03 1315)  Labs:  Recent Labs  05/24/17 0227  05/25/17 0502 05/25/17 1725 05/26/17 0400 05/26/17 1152  HGB 13.8  --   --  14.9 14.3  --   HCT 44.3  --   --  46.3 45.3  --   PLT 142*  --   --  143* 142*  --   HEPARINUNFRC 0.40  < > >2.20*  --  0.37 0.48  CREATININE 0.57*  --  0.45*  --  0.48*  --   < > = values in this interval not displayed.  Estimated Creatinine Clearance: 133.5 mL/min (A) (by C-G formula based on SCr of 0.48 mg/dL (L)).   Assessment: 51 y.o. male starting on heparin infusion 8/31 after doppler of upper extremity shows DVT in left subclavian. The PICC line was removed from left arm 8/31 and a new line was placed in the right arm. 9/2 pt also found to have LLE DVT. Heparin level is therapeutic x 2 after re-start. CBC within normal limits and no signs of bleeding per nurse.   Goal of Therapy:  Heparin level 0.3-0.7 units/ml Monitor platelets by anticoagulation protocol: Yes   Plan:  -Cont heparin 1750 units/hr - Daily heparin level/CBC - Monitor for signs/symptoms of bleeding  Susa Raring, PharmD PGY2 Infectious Diseases Pharmacy Resident Pager: [phone] 05/26/2017,1:41 PM

## 2017-05-26 NOTE — Evaluation (Signed)
Occupational Therapy Evaluation Patient Details Name: Harold Bailey MRN: [id number] DOB: 04/13/1966 Today's Date: 05/26/2017    History of Present Illness Harold Bailey is a 51 y.o. male with a past medical history significant for anxiety, asthma/COPD, smoking, SVT, seizures, memory issue for benign brain tumor, and Bipolar and substance abuse who presents with dyspnea found to COPD exacerbation with hypercarbic and hypoxic respiratory failure with MRSA bronchitis causing acute respiratory failure necessitating intubation, trached 05/23/17 also with LUE and LLE DVT   Clinical Impression   This 51 yo male admitted with above presents to acute OT with deficits below (see OT problem list) thus affecting his PLOF of being totally independent with his basic ADLs. He will benefit from acute OT with follow up OT at SNF to get back to PLOF.    Follow Up Recommendations  SNF;Supervision/Assistance - 24 hour    Equipment Recommendations  Other (comment) (TBD next venue)       Precautions / Restrictions Precautions Precautions: Fall Restrictions Weight Bearing Restrictions: No      Mobility Bed Mobility Overal bed mobility: Needs Assistance Bed Mobility: Supine to Sit     Supine to sit: Min assist     General bed mobility comments: moves quickly  Transfers Overall transfer level: Needs assistance Equipment used: Rolling walker (2 wheeled) Transfers: Sit to/from [facility] Sit to Stand: Min assist;+2 safety/equipment Stand pivot transfers: Min assist;+2 safety/equipment       General transfer comment: min A sit<>stand (+2 for safety/lines/tubes)    Balance Overall balance assessment: Needs assistance Sitting-balance support: Feet supported;No upper extremity supported Sitting balance-Leahy Scale: Fair Sitting balance - Comments: impulsive so really have to watch him   Standing balance support: Bilateral upper extremity supported;During functional  activity Standing balance-Leahy Scale: Poor                             ADL either performed or assessed with clinical judgement   ADL Overall ADL's : Needs assistance/impaired Eating/Feeding: NPO   Grooming: Set up;Supervision/safety;Sitting   Upper Body Bathing: Set up;Supervision/ safety;Sitting   Lower Body Bathing: Moderate assistance Lower Body Bathing Details (indicate cue type and reason): min A sit<>stand (+2 for safety/lines/tubes) Upper Body Dressing : Minimal assistance;Sitting   Lower Body Dressing: Moderate assistance Lower Body Dressing Details (indicate cue type and reason): min A sit<>stand (+2 for safety/lines/tubes) Toilet Transfer: Minimal assistance;+2 for safety/equipment;Stand-pivot;RW;BSC   Toileting- Clothing Manipulation and Hygiene: Total assistance Toileting - Clothing Manipulation Details (indicate cue type and reason): min A sit<>stand (+2 for safety/lines/tubes)             Vision Patient Visual Report: No change from baseline              Pertinent Vitals/Pain Pain Assessment: 0-10 Faces Pain Scale: Hurts worst Pain Location: first he said his face "like someone had punched him in the face" then when he moved he said is left great toe (both 10s) Pain Descriptors / Indicators: Grimacing;Sore Pain Intervention(s): Limited activity within patient's tolerance;Repositioned (made RN aware he was asking for pain cream for toe)     Hand Dominance Left   Extremity/Trunk Assessment Upper Extremity Assessment Upper Extremity Assessment: Generalized weakness (shakiness with movement)   Lower Extremity Assessment Lower Extremity Assessment: Defer to PT evaluation       Communication Communication Communication: Tracheostomy   Cognition Arousal/Alertness: Awake/alert Behavior During Therapy: Impulsive Overall Cognitive Status: History of cognitive impairments -  at baseline Area of Impairment: Orientation;Following  commands;Safety/judgement                 Orientation Level: Disoriented to;Time     Following Commands: Follows one step commands consistently Safety/Judgement: Decreased awareness of safety                    Home Living Family/patient expects to be discharged to:: Skilled nursing facility Living Arrangements: Spouse/significant other Available Help at Discharge: Family;Available PRN/intermittently (wife works) Type of Home: House Home Access: Level entry (basement) Entrance Stairs-Number of Steps: 7   Home Layout: Two level Alternate Level Stairs-Number of Steps: flight Alternate Level Stairs-Rails: Right;Left Bathroom Shower/Tub: Teacher, early years/pre: Handicapped height     Home Equipment: Environmental consultant - 2 wheels;Cane - single point   Additional Comments: Pt will stay in basement--level entry      Prior Functioning/Environment Level of Independence: Independent                 OT Problem List: Decreased strength;Impaired balance (sitting and/or standing);Cardiopulmonary status limiting activity;Decreased knowledge of use of DME or AE;Pain;Decreased safety awareness;Decreased cognition      OT Treatment/Interventions: Self-care/ADL training;Therapeutic activities;Patient/family education;DME and/or AE instruction;Balance training    OT Goals(Current goals can be found in the care plan section) Acute Rehab OT Goals Patient Stated Goal: asking about going home today OT Goal Formulation: With patient Time For Goal Achievement: 06/09/17 Potential to Achieve Goals: Good  OT Frequency: Min 2X/week   Barriers to D/C: Decreased caregiver support          Co-evaluation PT/OT/SLP Co-Evaluation/Treatment: Yes Reason for Co-Treatment: Complexity of the patient's impairments (multi-system involvement);For patient/therapist safety;To address functional/ADL transfers   OT goals addressed during session: ADL's and self-care;Strengthening/ROM       AM-PAC PT "6 Clicks" Daily Activity     Outcome Measure Help from another person eating meals?: Total (NPO) Help from another person taking care of personal grooming?: A Little Help from another person toileting, which includes using toliet, bedpan, or urinal?: A Lot Help from another person bathing (including washing, rinsing, drying)?: A Little Help from another person to put on and taking off regular upper body clothing?: A Little Help from another person to put on and taking off regular lower body clothing?: A Lot 6 Click Score: 14   End of Session Equipment Utilized During Treatment: Gait belt;Rolling walker;Oxygen (on wall (10 liters/60%); on tank (8 liters/55%)) Nurse Communication: Mobility status  Activity Tolerance: Patient tolerated treatment well Patient left: in chair;with call bell/phone within reach;with chair alarm set  OT Visit Diagnosis: Unsteadiness on feet (R26.81);Muscle weakness (generalized) (M62.81);Other symptoms and signs involving cognitive function                Time: 9509-3267 OT Time Calculation (min): 38 min Charges:  OT General Charges $OT Visit: 1 Visit OT Evaluation $OT Eval High Complexity: [address] [place], [place] [phone] 05/26/2017

## 2017-05-27 ENCOUNTER — Ambulatory Visit: Payer: Medicare Other | Admitting: Neurology

## 2017-05-27 ENCOUNTER — Other Ambulatory Visit (HOSPITAL_COMMUNITY): Payer: Self-pay

## 2017-05-27 ENCOUNTER — Inpatient Hospital Stay
Admission: AD | Admit: 2017-05-27 | Discharge: 2017-05-30 | Disposition: A | Discharge status: Other Acute Inpatient Hospital | Payer: Self-pay | Source: Ambulatory Visit | Attending: Internal Medicine | Admitting: Internal Medicine

## 2017-05-27 DIAGNOSIS — Z4659 Encounter for fitting and adjustment of other gastrointestinal appliance and device: Secondary | ICD-10-CM

## 2017-05-27 DIAGNOSIS — J9690 Respiratory failure, unspecified, unspecified whether with hypoxia or hypercapnia: Secondary | ICD-10-CM

## 2017-05-27 DIAGNOSIS — F419 Anxiety disorder, unspecified: Secondary | ICD-10-CM

## 2017-05-27 DIAGNOSIS — G40209 Localization-related (focal) (partial) symptomatic epilepsy and epileptic syndromes with complex partial seizures, not intractable, without status epilepticus: Secondary | ICD-10-CM

## 2017-05-27 LAB — CBC
HCT: 42.3 % (ref 39.0–52.0)
Hemoglobin: 13.2 g/dL (ref 13.0–17.0)
MCH: 32 pg (ref 26.0–34.0)
MCHC: 31.2 g/dL (ref 30.0–36.0)
MCV: 102.7 fL — ABNORMAL HIGH (ref 78.0–100.0)
PLATELETS: 122 10*3/uL — AB (ref 150–400)
RBC: 4.12 MIL/uL — AB (ref 4.22–5.81)
RDW: 13.9 % (ref 11.5–15.5)
WBC: 10 10*3/uL (ref 4.0–10.5)

## 2017-05-27 LAB — GLUCOSE, CAPILLARY
GLUCOSE-CAPILLARY: 105 mg/dL — AB (ref 65–99)
GLUCOSE-CAPILLARY: 115 mg/dL — AB (ref 65–99)
GLUCOSE-CAPILLARY: 172 mg/dL — AB (ref 65–99)
GLUCOSE-CAPILLARY: 92 mg/dL (ref 65–99)
Glucose-Capillary: 103 mg/dL — ABNORMAL HIGH (ref 65–99)

## 2017-05-27 LAB — BASIC METABOLIC PANEL
Anion gap: 4 — ABNORMAL LOW (ref 5–15)
BUN: 18 mg/dL (ref 6–20)
CO2: 33 mmol/L — ABNORMAL HIGH (ref 22–32)
CREATININE: 0.46 mg/dL — AB (ref 0.61–1.24)
Calcium: 8.1 mg/dL — ABNORMAL LOW (ref 8.9–10.3)
Chloride: 108 mmol/L (ref 101–111)
GFR calc Af Amer: >60 mL/min (ref >60)
Glucose, Bld: 102 mg/dL — ABNORMAL HIGH (ref 65–99)
POTASSIUM: 3.1 mmol/L — AB (ref 3.5–5.1)
SODIUM: 145 mmol/L (ref 135–145)

## 2017-05-27 LAB — HEPARIN LEVEL (UNFRACTIONATED): Heparin Unfractionated: 0.44 IU/mL (ref 0.30–0.70)

## 2017-05-27 MED ORDER — ACETAMINOPHEN 325 MG PO TABS: 650.0000 mg | ORAL_TABLET | Freq: Four times a day (QID) | ORAL | Status: DC | PRN

## 2017-05-27 MED ORDER — INSULIN ASPART 100 UNIT/ML ~~LOC~~ SOLN: 0.0000 [IU] | SUBCUTANEOUS | 11 refills | Status: DC

## 2017-05-27 MED ORDER — LACOSAMIDE 50 MG PO TABS
100.0000 mg | ORAL_TABLET | Freq: Two times a day (BID) | ORAL | Status: DC
  Filled 2017-05-27: qty 2

## 2017-05-27 MED ORDER — POTASSIUM CHLORIDE 20 MEQ/15ML (10%) PO SOLN
40.0000 meq | Freq: Two times a day (BID) | ORAL | Status: DC
  Administered 2017-05-27: 40 meq
  Filled 2017-05-27: qty 30

## 2017-05-27 MED ORDER — VITAL HIGH PROTEIN PO LIQD: 1000.0000 mL | ORAL | Status: DC

## 2017-05-27 MED ORDER — HEPARIN (PORCINE) IN NACL 100-0.45 UNIT/ML-% IJ SOLN: 1750.0000 [IU]/h | INTRAMUSCULAR | Status: DC

## 2017-05-27 MED ORDER — ONDANSETRON HCL 4 MG/2ML IJ SOLN: 4.0000 mg | Freq: Four times a day (QID) | INTRAMUSCULAR | Status: DC | PRN

## 2017-05-27 MED ORDER — BUDESONIDE 0.5 MG/2ML IN SUSP: 0.5000 mg | Freq: Two times a day (BID) | RESPIRATORY_TRACT | 12 refills | Status: DC

## 2017-05-27 MED ORDER — VALPROATE SODIUM 500 MG/5ML IV SOLN: 500.0000 mg | Freq: Two times a day (BID) | INTRAVENOUS | Status: DC

## 2017-05-27 MED ORDER — LACOSAMIDE 100 MG PO TABS: 100.0000 mg | ORAL_TABLET | Freq: Two times a day (BID) | ORAL | Status: DC

## 2017-05-27 MED ORDER — ASPIRIN 81 MG PO CHEW: 81.0000 mg | CHEWABLE_TABLET | Freq: Every day | ORAL | Status: DC

## 2017-05-27 MED ORDER — LACOSAMIDE 10 MG/ML PO SOLN: 100.0000 mg | Freq: Two times a day (BID) | ORAL | Status: DC

## 2017-05-27 MED ORDER — POTASSIUM CHLORIDE 10 MEQ/50ML IV SOLN
10.0000 meq | INTRAVENOUS | Status: DC
  Administered 2017-05-27: 10 meq via INTRAVENOUS
  Filled 2017-05-27: qty 50

## 2017-05-27 MED ORDER — GUAIFENESIN 100 MG/5ML PO SOLN: 5.0000 mL | Freq: Four times a day (QID) | ORAL | 0 refills | Status: DC

## 2017-05-27 MED ORDER — FREE WATER: 100.0000 mL | Freq: Three times a day (TID) | Status: DC

## 2017-05-27 MED ORDER — CHLORHEXIDINE GLUCONATE 0.12% ORAL RINSE (MEDLINE KIT): 15.0000 mL | Freq: Two times a day (BID) | OROMUCOSAL | 0 refills | Status: DC

## 2017-05-27 MED ORDER — PRO-STAT SUGAR FREE PO LIQD: 60.0000 mL | Freq: Four times a day (QID) | ORAL | 0 refills | Status: DC

## 2017-05-27 MED ORDER — ACETAMINOPHEN 650 MG RE SUPP: 650.0000 mg | Freq: Four times a day (QID) | RECTAL | Status: DC | PRN

## 2017-05-27 MED ORDER — CHLORHEXIDINE GLUCONATE CLOTH 2 % EX PADS: 6.0000 | MEDICATED_PAD | Freq: Every day | CUTANEOUS | Status: DC

## 2017-05-27 MED ORDER — VANCOMYCIN 50 MG/ML ORAL SOLUTION: 125.0000 mg | Freq: Four times a day (QID) | ORAL | Status: DC

## 2017-05-27 MED ORDER — GABAPENTIN 250 MG/5ML PO SOLN: 300.0000 mg | Freq: Three times a day (TID) | ORAL | 12 refills | Status: DC

## 2017-05-27 MED ORDER — ARFORMOTEROL TARTRATE 15 MCG/2ML IN NEBU: 15.0000 ug | INHALATION_SOLUTION | Freq: Two times a day (BID) | RESPIRATORY_TRACT | Status: DC

## 2017-05-27 MED ORDER — FENTANYL CITRATE (PF) 100 MCG/2ML IJ SOLN
25.0000 ug | INTRAMUSCULAR | 0 refills | Status: DC | PRN
Start: 2017-05-27 — End: 2018-09-02

## 2017-05-27 MED ORDER — FENTANYL 75 MCG/HR TD PT72: 75.0000 ug | MEDICATED_PATCH | TRANSDERMAL | 0 refills | Status: DC

## 2017-05-27 MED ORDER — SODIUM CHLORIDE 0.9% FLUSH: 10.0000 mL | Freq: Two times a day (BID) | INTRAVENOUS | Status: DC

## 2017-05-27 MED ORDER — ONDANSETRON HCL 4 MG PO TABS: 4.0000 mg | ORAL_TABLET | Freq: Four times a day (QID) | ORAL | Status: DC | PRN

## 2017-05-27 MED ORDER — ALBUTEROL SULFATE (2.5 MG/3ML) 0.083% IN NEBU: 2.5000 mg | INHALATION_SOLUTION | Freq: Four times a day (QID) | RESPIRATORY_TRACT | 12 refills | Status: DC

## 2017-05-27 MED ORDER — PREDNISONE 20 MG PO TABS: 40.0000 mg | ORAL_TABLET | Freq: Every day | ORAL | Status: DC

## 2017-05-27 NOTE — Discharge Summary (Signed)
Physician Discharge Summary       Patient ID: Harold Bailey MRN: [id number] DOB/AGE: 1966-07-26 51 y.o.  Admit date: 05/10/2017 Discharge date: 05/27/2017  Discharge Diagnoses:   Acute on chronic hypoxic respiratory failure MRSA tracheobronchitis  AECOPD Presumed aspiration PNA Tracheostomy status C-diff Colitis  Left upper extremity DVT Left Lower extremity DVT Left femoral artery stenosis Cold Left foot w/ severe PVD Seizure d/o Acute encephalopathy  Anxiety  Narcotic dependence  physical deconditioning   Detailed Hospital Course:  Subjective 51 year old male patient with significant history of chronic obstructive pulmonary disease, seizure disorder, bipolar disease and supraventricular tachycardia. Had a recent hospitalization for SVT requiring cardioversion with adenosine. Also recent left heart cath which showed non-obstructive coronary artery disease. Apparently during that hospitalization he developed symptoms consistent with bronchitis for which he was treated with Omnicef and prednisone. He was eventually discharged to home. He presented to the emergency room on 8/18 at [facility] clinic due to history of worsening shortness of breath, cough, and wheezing. He was recently admitted to the hospitalist service, therapeutic interventions included systemic steroids, nebulizers,and empiric antibiotics.Early the morning of 8/19 he developed progressive respiratory decline with lethargy. He was intubated, placed on mechanical ventilation, underwent bedside bronchoscopy which showed very thick airway secretions. Hospital course highlights: Transferred to the intensive care 8/19. Broad spectrum antibiotics including: vancomycin, and ceftazidime.Cultures were obtained, these ultimately grew methicillin resistant staph aureus,and he completed a ten-day course of therapy for this. Hospital course was complicated by the following: Failed extubation,was extubated on 8/28,  however due to agitated delirium, as well as hallucinations he required reintubation on 8/29. Additional complicating factors included C. Difficile colitis identified on 8/24. He was initiated on oral vancomycin for this. Because of his prolonged hospitalization,failed extubation attempt, and ongoing delirium as well as underlying obstructive lung disease it was felt best to proceed with tracheostomy in efforts to liberate him from mechanical ventilation. He underwent tracheostomy on 8/31 by Dr. Titus Mould. Weaning efforts were again initiated the following day. At time of discharge dictation his agitated delirium has resolved. He is now on aerosol trach collar and doing well with this. He is completed antibiotics for pneumonia/MRSA bronchitis. He is weaning systemic steroids. And continuing rehabilitation efforts. During a.m. Rounds on 9/2 the patient complained of left foot pain/paresthesia and feeling cold. He had a known history of peripheral vascular disease. Because of this we asked vascular surgery to see him.He was seen by Dr. Donzetta Matters his foot was cool but sensory and motor were intact. He also had intact collaterals noted on duplex imaging. Recommendations were this were to continue aspirin and statin. Continue anticoagulation. And he is set up for a angiogram with Dr. Oneida Alar on 9/7. Depending on these results he may require elective intervention. He is however ready for transfer. [facility]   Discharge Plan by active problems   Tracheostomy dependent following prolonged critical illness  COPD w/ resolving Acute excerebration  Plan  Continue Prednisone; wean as able to off  Continue bronchodilators Trach collar as long as tolerated   Left greater than right lower extremity ischemic changes in setting of severe PVD w/ Cold left foot -seen by vascular surgery Dr Donzetta Matters Plan **Needs Angiogram Friday 9/7; scheduled w/ Dr Su Grand** If intervention  required may need to come back [facility] as [facility] surg support [facility]  Con heparin gtt  Left upper extremity DVT Lower lower extremity DVT Plan Continue  heparin  Inadequate oral intake R/o dysphagia  Aspiration risk Plan:   Continue tube feeding Monitor for aspiration SLP evaluation Few ice chips for mouth dryness   C.Diff Positive  Plan   Oral vanc for 14d course. (started 8/24)  Hyperglycemia  Plan SSI  Seizure disorder Anxiety Pain, narcotic dependence P:   Prn fentanyl Cont Vimpat & valproic acid   Significant Hospital tests/ studies  Consults  Dr Donzetta Matters (vascular surgery)  STUDIES:  Bronchoscopy 8/19 > tracheomalacia, left bronchomalacia, thick pus 8/21 CT chest> emphysema upper lobes, thick airways but no bronchiectasis, subsegmental atelectasis 8/30 LUE ultrasound > DVT 9/2 lower extremity doppler: lower extremity DVT noted, left distal femoral artery occluded but collateral flow noted  CULTURES: 8/19 sputum culture > MRSA 8/24 blood > no growth  8/24 c diff positive  ANTIBIOTICS: 8/19 IV vanc > 8/29 8/28 Fortaz > off 8/24 PO vanc  SIGNIFICANT EVENTS: 8/19 Intubated  8/28 Extubated  8/29 Re-intubated  8/31 Tracheostomy  LINES/TUBES: 8/19 ETT > 8/28 ETT 8/29 >> 8/31 Trach 8/31 Titus Mould)  Discharge Exam: BP 108/75   Pulse 84   Temp 98.5 F (36.9 C) (Oral)   Resp 13   Ht 6' (1.829 m)   Wt 227 lb 1.2 oz (103 kg)   SpO2 92%   BMI 30.80 kg/m   Gen.: 51 year old male resting comfortably in the chair no acute distress. HEENT #6 cuffed trach currently on aerosol trach collar minimal tracheal secretions pulmonary: Respirations nonlabored, scattered rhonchi. Cardiac: Regular rate and rhythm abdomen: Soft nontender extremities: Warm with the exception of the left foot, however this has improved on serial exams he does have bilateral upper extremity edema/anasarca neuro: Awake, oriented,no focal deficits  Labs at  discharge Lab Results  Component Value Date   CREATININE 0.46 (L) 05/27/2017   BUN 18 05/27/2017   NA 145 05/27/2017   K 3.1 (L) 05/27/2017   CL 108 05/27/2017   CO2 33 (H) 05/27/2017   Lab Results  Component Value Date   WBC 10.0 05/27/2017   HGB 13.2 05/27/2017   HCT 42.3 05/27/2017   MCV 102.7 (H) 05/27/2017   PLT 122 (L) 05/27/2017   Lab Results  Component Value Date   ALT 23 05/12/2017   AST 12 (L) 05/12/2017   ALKPHOS 62 05/12/2017   BILITOT 0.5 05/12/2017   Lab Results  Component Value Date   INR 1.07 05/23/2017   INR 0.98 09/03/2013   INR 2.11 (H) 02/05/2013    Current radiology studies Dg Chest Port 1 View  Result Date: 05/25/2017 CLINICAL DATA:  Encounter for central line placement EXAM: PORTABLE CHEST 1 VIEW COMPARISON:  May 24, 2017 FINDINGS: The heart size and mediastinal contours are within normal limits. Right jugular central venous line is identified with distal tip in the superior vena cava. No pleural line is identified to suggest pneumothorax. There are minimal bilateral pleural effusions and minimal atelectasis of both lung bases. A feeding tube is identified coiled in the proximal stomach. The visualized skeletal structures are stable. IMPRESSION: Right jugular central venous line with distal tip in the superior vena cava. No pleural line noted to suggest pneumothorax. Minimal bilateral pleural effusions and minimal atelectasis of both lung bases. Electronically Signed   By: Abelardo Diesel M.D.   On: 05/25/2017 15:53    Disposition:  01-Home or Self Care  Discharge Instructions    Increase activity slowly    Complete by:  As directed      Allergies as of  05/27/2017      Reactions   Hydrocodone Rash   No Known Allergies       Medication List    STOP taking these medications   albuterol 108 (90 Base) MCG/ACT inhaler Commonly known as:  PROVENTIL HFA;VENTOLIN HFA Replaced by:  albuterol (2.5 MG/3ML) 0.083% nebulizer solution   ALPRAZolam 1  MG tablet Commonly known as:  XANAX   buPROPion 150 MG 12 hr tablet Commonly known as:  ZYBAN   cyclobenzaprine 10 MG tablet Commonly known as:  FLEXERIL   diclofenac sodium 1 % Gel Commonly known as:  VOLTAREN   divalproex 500 MG 24 hr tablet Commonly known as:  DEPAKOTE ER   docusate sodium 100 MG capsule Commonly known as:  COLACE   Fluticasone-Salmeterol 250-50 MCG/DOSE Aepb Commonly known as:  ADVAIR DISKUS   gabapentin 300 MG capsule Commonly known as:  NEURONTIN Replaced by:  gabapentin 250 MG/5ML solution   lisinopril-hydrochlorothiazide 20-25 MG tablet Commonly known as:  PRINZIDE,ZESTORETIC   meloxicam 15 MG tablet Commonly known as:  MOBIC   methocarbamol 500 MG tablet Commonly known as:  ROBAXIN   multivitamin with minerals Tabs tablet   nabumetone 500 MG tablet Commonly known as:  RELAFEN   naproxen 500 MG tablet Commonly known as:  NAPROSYN   traZODone 50 MG tablet Commonly known as:  DESYREL     TAKE these medications   acetaminophen 325 MG tablet Commonly known as:  TYLENOL Place 2 tablets (650 mg total) into feeding tube every 6 (six) hours as needed for mild pain (or Fever >/= 101). What changed:  medication strength  how much to take  how to take this  reasons to take this   albuterol (2.5 MG/3ML) 0.083% nebulizer solution Commonly known as:  PROVENTIL Take 3 mLs (2.5 mg total) by nebulization every 6 (six) hours. Replaces:  albuterol 108 (90 Base) MCG/ACT inhaler   arformoterol 15 MCG/2ML Nebu Commonly known as:  BROVANA Take 2 mLs (15 mcg total) by nebulization 2 (two) times daily.   aspirin 81 MG chewable tablet Place 1 tablet (81 mg total) into feeding tube daily.   budesonide 0.5 MG/2ML nebulizer solution Commonly known as:  PULMICORT Take 2 mLs (0.5 mg total) by nebulization 2 (two) times daily.   chlorhexidine gluconate (MEDLINE KIT) 0.12 % solution Commonly known as:  PERIDEX 15 mLs by Mouth Rinse route 2 (two)  times daily.   Chlorhexidine Gluconate Cloth 2 % Pads Apply 6 each topically daily.   feeding supplement (PRO-STAT SUGAR FREE 64) Liqd Take 60 mLs by mouth 4 (four) times daily.   feeding supplement (VITAL HIGH PROTEIN) Liqd liquid Place 1,000 mLs into feeding tube continuous.   fentaNYL 100 MCG/2ML injection Commonly known as:  SUBLIMAZE Inject 0.5-2 mLs (25-100 mcg total) into the vein every 2 (two) hours as needed for severe pain.   fentaNYL 75 MCG/HR Commonly known as:  DURAGESIC - dosed mcg/hr Place 1 patch (75 mcg total) onto the skin every 3 (three) days.   free water Soln Place 100 mLs into feeding tube every 8 (eight) hours.   gabapentin 250 MG/5ML solution Commonly known as:  NEURONTIN Place 6 mLs (300 mg total) into feeding tube every 8 (eight) hours. Replaces:  gabapentin 300 MG capsule   guaiFENesin 100 MG/5ML Soln Commonly known as:  ROBITUSSIN Place 5 mLs (100 mg total) into feeding tube every 6 (six) hours.   heparin 100-0.45 UNIT/ML-% infusion Inject 1,750 Units/hr into the vein continuous.  insulin aspart 100 UNIT/ML injection Commonly known as:  novoLOG Inject 0-15 Units into the skin every 4 (four) hours.   Lacosamide 100 MG Tabs Place 1 tablet (100 mg total) into feeding tube 2 (two) times daily. What changed:  how to take this   predniSONE 20 MG tablet Commonly known as:  DELTASONE Place 2 tablets (40 mg total) into feeding tube daily with breakfast.   sodium chloride flush 0.9 % Soln Commonly known as:  NS 10-40 mLs by Intracatheter route every 12 (twelve) hours.   valproate 500 mg in dextrose 5 % 50 mL Inject 500 mg into the vein 2 (two) times daily.   vancomycin 50 mg/mL oral solution Commonly known as:  VANCOCIN Place 2.5 mLs (125 mg total) into feeding tube every 6 (six) hours.            Discharge Care Instructions        Start     Ordered   05/28/17 0000  aspirin 81 MG chewable tablet  Daily    Question:  Supervising  Provider  Answer:  Chesley Mires   05/27/17 1307   05/28/17 0000  Chlorhexidine Gluconate Cloth 2 % PADS  Daily    Question:  Supervising Provider  Answer:  Chesley Mires   05/27/17 1307   05/28/17 0000  predniSONE (DELTASONE) 20 MG tablet  Daily with breakfast    Question:  Supervising Provider  Answer:  Chesley Mires   05/27/17 1307   05/27/17 0000  acetaminophen (TYLENOL) 325 MG tablet  Every 6 hours PRN    Question:  Supervising Provider  Answer:  Chesley Mires   05/27/17 1307   05/27/17 0000  albuterol (PROVENTIL) (2.5 MG/3ML) 0.083% nebulizer solution  Every 6 hours    Question:  Supervising Provider  Answer:  Chesley Mires   05/27/17 1307   05/27/17 0000  arformoterol (BROVANA) 15 MCG/2ML NEBU  2 times daily    Question:  Supervising Provider  Answer:  Chesley Mires   05/27/17 1307   05/27/17 0000  budesonide (PULMICORT) 0.5 MG/2ML nebulizer solution  2 times daily    Question:  Supervising Provider  Answer:  Chesley Mires   05/27/17 1307   05/27/17 0000  chlorhexidine gluconate, MEDLINE KIT, (PERIDEX) 0.12 % solution  2 times daily    Question:  Supervising Provider  Answer:  Chesley Mires   05/27/17 1307   05/27/17 0000  Amino Acids-Protein Hydrolys (FEEDING SUPPLEMENT, PRO-STAT SUGAR FREE 64,) LIQD  4 times daily    Question:  Supervising Provider  Answer:  Chesley Mires   05/27/17 1307   05/27/17 0000  Nutritional Supplements (FEEDING SUPPLEMENT, VITAL HIGH PROTEIN,) LIQD liquid  Continuous    Question:  Supervising Provider  Answer:  Chesley Mires   05/27/17 1307   05/27/17 0000  fentaNYL (DURAGESIC - DOSED MCG/HR) 75 MCG/HR  every 72 hours    Question:  Supervising Provider  Answer:  Chesley Mires   05/27/17 1307   05/27/17 0000  fentaNYL (SUBLIMAZE) 100 MCG/2ML injection  Every 2 hours PRN    Question:  Supervising Provider  Answer:  Chesley Mires   05/27/17 1307   05/27/17 0000  Water For Irrigation, Sterile (FREE WATER) SOLN  Every 8 hours    Question:  Supervising Provider   Answer:  Chesley Mires   05/27/17 1307   05/27/17 0000  gabapentin (NEURONTIN) 250 MG/5ML solution  Every 8 hours    Question:  Supervising Provider  Answer:  Chesley Mires  05/27/17 1307   05/27/17 0000  guaiFENesin (ROBITUSSIN) 100 MG/5ML SOLN  Every 6 hours    Question:  Supervising Provider  Answer:  Chesley Mires   05/27/17 1307   05/27/17 0000  heparin 100-0.45 UNIT/ML-% infusion  Continuous    Question:  Supervising Provider  Answer:  Chesley Mires   05/27/17 1307   05/27/17 0000  insulin aspart (NOVOLOG) 100 UNIT/ML injection  Every 4 hours    Question:  Supervising Provider  Answer:  Chesley Mires   05/27/17 1307   05/27/17 0000  lacosamide 100 MG TABS  2 times daily    Question:  Supervising Provider  Answer:  Chesley Mires   05/27/17 1307   05/27/17 0000  sodium chloride flush (NS) 0.9 % SOLN  Every 12 hours    Question:  Supervising Provider  Answer:  Chesley Mires   05/27/17 1307   05/27/17 0000  valproate 500 mg in dextrose 5 % 50 mL  2 times daily    Question:  Supervising Provider  Answer:  Chesley Mires   05/27/17 1307   05/27/17 0000  vancomycin (VANCOCIN) 50 mg/mL oral solution  Every 6 hours    Question:  Supervising Provider  Answer:  Chesley Mires   05/27/17 1307   05/27/17 0000  Increase activity slowly     05/27/17 1307       Discharged Condition: good  Physician Statement:   The Patient was personally examined, the discharge assessment and plan has been personally reviewed and I agree with ACNP Shiah Berhow's assessment and plan.45 minutes of time have been dedicated to discharge assessment, planning and discharge instructions.   Signed: Clementeen Graham 05/27/2017, 1:07 PM

## 2017-05-27 NOTE — Progress Notes (Signed)
ANTICOAGULATION CONSULT NOTE - Follow Up Consult  Pharmacy Consult for Heparin  Indication: DVT, upper/lower extremity   Allergies  Allergen Reactions  . Hydrocodone Rash  . No Known Allergies     Patient Measurements: Height: 6' (182.9 cm) Weight: 227 lb 1.2 oz (103 kg) IBW/kg (Calculated) : 77.6  Vital Signs: Temp: 98.5 F (36.9 C) (09/04 0337) Temp Source: Oral (09/04 0337) BP: 152/121 (09/04 0815) Pulse Rate: 97 (09/04 0815)  Labs:  Recent Labs  05/25/17 0502  05/25/17 1725 05/26/17 0400 05/26/17 1152 05/27/17 0332  HGB  --   < > 14.9 14.3  --  13.2  HCT  --   --  46.3 45.3  --  42.3  PLT  --   --  143* 142*  --  122*  HEPARINUNFRC >2.20*  --   --  0.37 0.48 0.44  CREATININE 0.45*  --   --  0.48*  --  0.46*  < > = values in this interval not displayed.  Estimated Creatinine Clearance: 134.1 mL/min (A) (by C-G formula based on SCr of 0.46 mg/dL (L)).   Assessment: 51 y.o. male starting on heparin infusion 8/31 after doppler of upper extremity shows DVT in left subclavian. The PICC line was removed from left arm 8/31 and a new line was placed in the right arm. 9/2 pt also found to have LLE DVT. Heparin level is therapeutic x 3 after re-start. H/H within normal limits, slight downtrend in platelets with no signs of bleeding at this time.   Goal of Therapy:  Heparin level 0.3-0.7 units/ml Monitor platelets by anticoagulation protocol: Yes   Plan:  -Continue heparin gtt at 1750 units/hr - Daily heparin level/CBC - Monitor for signs/symptoms of bleeding  Bertis Ruddy, PharmD Pharmacy Resident Pager #: [phone] 05/27/2017 9:33 AM

## 2017-05-27 NOTE — Progress Notes (Signed)
[facility] Progress Note Patient Name: Harold Bailey DOB: [birth year] MRN: [id number]   Date of Service  05/27/2017  HPI/Events of Note  K+ = 3.1 and Creatinine = 0.46.  eICU Interventions  Will replace K+.     Intervention Category Major Interventions: Electrolyte abnormality - evaluation and management  Sommer,Steven Eugene 05/27/2017, 6:32 AM

## 2017-05-27 NOTE — Progress Notes (Signed)
Physical Therapy Treatment Patient Details Name: Harold Bailey MRN: [id number] DOB: 02-22-1966 Today's Date: 05/27/2017    History of Present Illness Harold Bailey is a 51 y.o. male with a past medical history significant for anxiety, asthma/COPD, smoking, SVT, seizures, memory issue for benign brain tumor, and Bipolar and substance abuse who presents with dyspnea found to COPD exacerbation with hypercarbic and hypoxic respiratory failure with MRSA bronchitis causing acute respiratory failure necessitating intubation, trached 05/23/17 also with LUE and LLE DVT    PT Comments    Pt very pleasant and willing to mobilize. Pt with frequent stool but continent and able to increase ambulation distance and activity with decreased assist. Pt continues to perseverate on getting ice despite being NPO. Will continue to follow to maximize function.   HR 135 with gait, SpO2 88-93% on FiO2 55% trach collar   Follow Up Recommendations  SNF;Supervision/Assistance - 24 hour     Equipment Recommendations  3in1 (PT)    Recommendations for Other Services       Precautions / Restrictions Precautions Precautions: Fall Precaution Comments: trach, cortrak    Mobility  Bed Mobility Overal bed mobility: Needs Assistance Bed Mobility: Supine to Sit     Supine to sit: Min assist     General bed mobility comments: HOB 15degrees with assist to fully elevate trunk, use of rail   Transfers Overall transfer level: Needs assistance   Transfers: Sit to/from Stand;Stand Pivot Transfers Sit to Stand: Min assist Stand pivot transfers: Min guard       General transfer comment: cues for safety and lines with min assist to stand initially from bed, stood from [facility] and chair without physical assist. Pivot bed>BSC and chair >BSC for toileting with assist for pericare  Ambulation/Gait Ambulation/Gait assistance: Min assist Ambulation Distance (Feet): 110 Feet Assistive device: Rolling walker (2  wheeled) Gait Pattern/deviations: Step-through pattern;Decreased stride length;Trunk flexed   Gait velocity interpretation: Below normal speed for age/gender General Gait Details: cues for posture, position in RW and activity regulation as pt SpO2 dropped to 88% on 55% trach collar with gait, chair follow with return to room seated   Stairs            Wheelchair Mobility    Modified Rankin (Stroke Patients Only)       Balance Overall balance assessment: Needs assistance   Sitting balance-Leahy Scale: Good       Standing balance-Leahy Scale: Poor                              Cognition Arousal/Alertness: Awake/alert Behavior During Therapy: Impulsive Overall Cognitive Status: No family/caregiver present to determine baseline cognitive functioning Area of Impairment: Orientation;Following commands;Safety/judgement                 Orientation Level: Disoriented to;Time     Following Commands: Follows one step commands consistently Safety/Judgement: Decreased awareness of safety            Exercises General Exercises - Lower Extremity Long Arc Quad: AROM;Both;Seated;10 reps Hip Flexion/Marching: AROM;Both;Seated;10 reps    General Comments        Pertinent Vitals/Pain Pain Score: 4  Pain Location: nose with cortrak Pain Descriptors / Indicators: Grimacing;Sore Pain Intervention(s): Monitored during session    Home Living                      Prior Function  PT Goals (current goals can now be found in the care plan section) Progress towards PT goals: Progressing toward goals    Frequency    Min 3X/week      PT Plan Current plan remains appropriate    Co-evaluation              AM-PAC PT "6 Clicks" Daily Activity  Outcome Measure  Difficulty turning over in bed (including adjusting bedclothes, sheets and blankets)?: A Little Difficulty moving from lying on back to sitting on the side of the  bed? : Unable Difficulty sitting down on and standing up from a chair with arms (e.g., wheelchair, bedside commode, etc,.)?: A Little Help needed moving to and from a bed to chair (including a wheelchair)?: A Little Help needed walking in hospital room?: A Little Help needed climbing 3-5 steps with a railing? : A Lot 6 Click Score: 15    End of Session Equipment Utilized During Treatment: Gait belt;Oxygen Activity Tolerance: Patient tolerated treatment well Patient left: in chair;with chair alarm set;with call bell/phone within reach;with nursing/sitter in room Nurse Communication: Mobility status PT Visit Diagnosis: Other abnormalities of gait and mobility (R26.89);Difficulty in walking, not elsewhere classified (R26.2);Muscle weakness (generalized) (M62.81);History of falling (Z91.81)     Time: 4854-6270 PT Time Calculation (min) (ACUTE ONLY): 35 min  Charges:  $Gait Training: 8-22 mins $Therapeutic Exercise: 8-22 mins                    G Codes:       Elwyn Reach, PT [phone]    [place]ent 05/27/2017, 10:02 AM

## 2017-05-27 NOTE — Progress Notes (Signed)
[place] TEAM 1 - Stepdown/ICU TEAM  Harold Bailey  HWE:[id number] DOB: 06-Oct-1965 DOA: 05/10/2017 PCP: Everardo Beals, NP    Brief Narrative:  51 y/o male with COPD/asthma admitted for a flare of the same with MRSA bronchitis causing acute rspiratory failure necessitating intubation.  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  Communication is difficult due to the presence of a trach.  He is in good spirits.  He denies shortness of breath.  He does continue to expectorate thickish sputum via his trach.  He reports slowing of his diarrhea.  There is no evidence of uncontrolled pain.  Assessment & Plan:  MRSA Tracheobronchitis +/- AECOPD w/ acute hypoxic respiratory failure Has completed a course of antibiotic therapy - continue bronchodilators and steroids - tracheostomy care per pulmonary medicine  Probable aspiration SLP eval pending   Left greater than right lower extremity ischemic changes - Left femoral artery stenosis Vascular Surgery following - will need to have angiogram prior to discharge - aspirin and statin  Left upper extremity DVT - Lower lower extremity DVT Continue heparin  C.Diff Positive  Oral vanc for 10 day course - clinically appears to be improving  Hypokalemia Due to limited intake as well as accelerated GI losses - supplement and follow  Seizure disorder  Macrocytosis  Check E93 and folic acid   Anxiety  Chronic Pain, narcotic dependence Prn fentanyl  MRSA screen +  DVT prophylaxis: heparin gtt Code Status: FULL CODE Family Communication: no family present at time of exam  Disposition Plan: will need SNF for a  rehab stay   Consultants:  PCCM  Antimicrobials:  8/19 IV vanc > 8/29 8/28 Fortaz > off 8/24 PO vanc   Objective: Blood pressure (!) 152/121, pulse 97, temperature 98.5 F (36.9 C), temperature source Oral, resp. rate 15, height 6' (1.829 m), weight 103 kg (227 lb 1.2 oz), SpO2 99 %.  Intake/Output Summary (Last 24 hours) at 05/27/17 0918 Last data filed at 05/27/17 0859  Gross per 24 hour  Intake           2217.5 ml  Output             1218 ml  Net            999.5 ml   Filed Weights   05/24/17 0358 05/26/17 0500 05/27/17 0449  Weight: 107.7 kg (237 lb 7 oz) 102 kg (224 lb 13.9 oz) 103 kg (227 lb 1.2 oz)    Examination: General: No acute respiratory distress - mucous via trach  Lungs: Clear to auscultation bilaterally without wheezes or crackles Cardiovascular: Regular rate and rhythm without murmur gallop or rub normal S1 and S2 Abdomen: Nontender, nondistended, soft, bowel sounds positive, no rebound, no ascites, no appreciable mass Extremities: No significant cyanosis, clubbing, or edema bilateral lower extremities  CBC:  Recent Labs Lab 05/22/17 0327 05/23/17 0350 05/24/17 0227 05/25/17 1725 05/26/17 0400 05/27/17 0332  WBC 13.2* 9.0 10.4 12.7* 12.2* 10.0  NEUTROABS 11.5*  --  6.9  --   --   --   HGB 15.4 13.8 13.8 14.9 14.3 13.2  HCT 49.9 45.8 44.3 46.3 45.3 42.3  MCV 106.9* 106.3* 103.3* 103.3* 103.4* 102.7*  PLT 157  151 142* 143* 142* 413*   Basic Metabolic Panel:  Recent Labs Lab 05/21/17 0416  05/23/17 0350 05/24/17 0227 05/25/17 0502 05/26/17 0400 05/27/17 0332  NA 155*  < > 145 144 144 146* 145  K 3.5  < > 4.0 3.7 3.5 3.0* 3.1*  CL 119*  < > 108 108 108 108 108  CO2 30  < > 30 33* 29 31 33*  GLUCOSE 99  < > 210* 133* 132* 95 102*  BUN 23*  < > 22* _0 CREATININE 0.65  < > 0.70 0.57* 0.45* 0.48* 0.46*  CALCIUM 7.8*  < > 8.0* 8.0* 7.9* 7.8* 8.1*  MG 2.2  --  2.3  --   --   --   --   PHOS 2.3*  --  3.4  --   --   --   --   < >  = values in this interval not displayed. GFR: Estimated Creatinine Clearance: 134.1 mL/min (A) (by C-G formula based on SCr of 0.46 mg/dL (L)).  Liver Function Tests: No results for input(s): AST, ALT, ALKPHOS, BILITOT, PROT, ALBUMIN in the last 168 hours. No results for input(s): LIPASE, AMYLASE in the last 168 hours. No results for input(s): AMMONIA in the last 168 hours.  Coagulation Profile:  Recent Labs Lab 05/23/17 0350  INR 1.07    HbA1C: Hgb A1c MFr Bld  Date/Time Value Ref Range Status  11/22/2013 05:23 PM 5.9 (H) <5.7 % Final    Comment:                                                                           According to the ADA Clinical Practice Recommendations for 2011, when HbA1c is used as a screening test:     >=6.5%   Diagnostic of Diabetes Mellitus            (if abnormal result is confirmed)   5.7-6.4%   Increased risk of developing Diabetes Mellitus   References:Diagnosis and Classification of Diabetes Mellitus,Diabetes KGMW,1027,25(DGUYQ 1):S62-S69 and Standards of Medical Care in         Diabetes - 2011,Diabetes IHKV,4259,56 (Suppl 1):S11-S61.    08/12/2013 03:18 PM 5.5 <5.7 % Final    Comment:                                                                           According to the ADA Clinical Practice Recommendations for 2011, when HbA1c is used as a screening test:     >=6.5%   Diagnostic of Diabetes Mellitus            (if abnormal result is confirmed)   5.7-6.4%   Increased risk of developing Diabetes Mellitus   References:Diagnosis and Classification of Diabetes Mellitus,Diabetes LOVF,6433,29(JJOAC 1):S62-S69 and Standards of Medical Care in         Diabetes - 2011,Diabetes Care,2011,34 (Suppl 1):S11-S61.      CBG:  Recent  Labs Lab 05/26/17 1551 05/26/17 1946 05/27/17 0011 05/27/17 0336 05/27/17 0819  GLUCAP 115* 127* 105* 92 103*    Scheduled Meds: . albuterol  2.5 mg Nebulization Q6H  . arformoterol  15 mcg  Nebulization BID  . aspirin  81 mg Per Tube Daily  . budesonide (PULMICORT) nebulizer solution  0.5 mg Nebulization BID  . chlorhexidine gluconate (MEDLINE KIT)  15 mL Mouth Rinse BID  . Chlorhexidine Gluconate Cloth  6 each Topical Daily  . feeding supplement (PRO-STAT SUGAR FREE 64)  60 mL Oral QID  . fentaNYL  75 mcg Transdermal Q72H  . free water  200 mL Per Tube Q6H  . gabapentin  300 mg Per Tube Q8H  . guaiFENesin  5 mL Per Tube Q6H  . insulin aspart  0-15 Units Subcutaneous Q4H  . mouth rinse  15 mL Mouth Rinse QID  . predniSONE  40 mg Per Tube Q breakfast  . sodium chloride flush  10-40 mL Intracatheter Q12H  . vancomycin  125 mg Per Tube Q6H     LOS: 17 days   Cherene Altes, MD [facility] Office  [phone] Pager - Text Page per Shea Evans as per below:  On-Call/Text Page:      [email or web address]      password [email or web address]  If 7PM-7AM, please contact night-coverage [email or web address] Password [email or web address] 05/27/2017, 9:18 AM

## 2017-05-27 NOTE — Care Management (Signed)
Pt to discharge to [facility] today

## 2017-05-27 NOTE — Progress Notes (Signed)
  Progress Note    05/27/2017 11:22 AM * No surgery found *  Subjective:  No new complaints  Vitals:   05/27/17 1030 05/27/17 1100  BP:  (!) 177/152  Pulse: 91 93  Resp: (!) 21 12  Temp:    SpO2: 93% 92%    Physical Exam: aaox3 Trach in place Left foot remains cool with weak pt signal  CBC    Component Value Date/Time   WBC 10.0 05/27/2017 0332   RBC 4.12 (L) 05/27/2017 0332   HGB 13.2 05/27/2017 0332   HCT 42.3 05/27/2017 0332   PLT 122 (L) 05/27/2017 0332   MCV 102.7 (H) 05/27/2017 0332   MCH 32.0 05/27/2017 0332   MCHC 31.2 05/27/2017 0332   RDW 13.9 05/27/2017 0332   LYMPHSABS 2.7 05/24/2017 0227   MONOABS 0.7 05/24/2017 0227   EOSABS 0.1 05/24/2017 0227   BASOSABS 0.0 05/24/2017 0227    BMET    Component Value Date/Time   NA 145 05/27/2017 0332   K 3.1 (L) 05/27/2017 0332   CL 108 05/27/2017 0332   CO2 33 (H) 05/27/2017 0332   GLUCOSE 102 (H) 05/27/2017 0332   BUN 18 05/27/2017 0332   CREATININE 0.46 (L) 05/27/2017 0332   CREATININE 1.06 11/22/2013 1723   CALCIUM 8.1 (L) 05/27/2017 0332   GFRNONAA >60 05/27/2017 0332   GFRNONAA 82 11/22/2013 1723   GFRAA >60 05/27/2017 0332   GFRAA >89 11/22/2013 1723    INR    Component Value Date/Time   INR 1.07 05/23/2017 0350     Intake/Output Summary (Last 24 hours) at 05/27/17 1122 Last data filed at 05/27/17 1100  Gross per 24 hour  Intake             2430 ml  Output             1293 ml  Net             1137 ml     Assessment:  51 y.o. male is here with bronchitis. Has cold right foot with weak signal.   Plan: Angiogram with Dr. Oneida Alar planned for Friday. Asa/statin  Eda Paschal Donzetta Matters, MD [facility] Office: [phone] Pager: [phone]  05/27/2017 11:22 AM

## 2017-05-28 LAB — CBC WITH DIFFERENTIAL/PLATELET
BASOS ABS: 0 10*3/uL (ref 0.0–0.1)
Basophils Relative: 0 %
EOS PCT: 1 %
Eosinophils Absolute: 0.1 10*3/uL (ref 0.0–0.7)
HCT: 43.4 % (ref 39.0–52.0)
Hemoglobin: 13.9 g/dL (ref 13.0–17.0)
LYMPHS PCT: 10 %
Lymphs Abs: 0.8 10*3/uL (ref 0.7–4.0)
MCH: 32.3 pg (ref 26.0–34.0)
MCHC: 32 g/dL (ref 30.0–36.0)
MCV: 100.9 fL — AB (ref 78.0–100.0)
MONO ABS: 0.3 10*3/uL (ref 0.1–1.0)
Monocytes Relative: 4 %
Neutro Abs: 6.7 10*3/uL (ref 1.7–7.7)
Neutrophils Relative %: 85 %
PLATELETS: 149 10*3/uL — AB (ref 150–400)
RBC: 4.3 MIL/uL (ref 4.22–5.81)
RDW: 14 % (ref 11.5–15.5)
WBC: 8 10*3/uL (ref 4.0–10.5)

## 2017-05-28 LAB — COMPREHENSIVE METABOLIC PANEL
ALT: 65 U/L — ABNORMAL HIGH (ref 17–63)
AST: 32 U/L (ref 15–41)
Albumin: 2.3 g/dL — ABNORMAL LOW (ref 3.5–5.0)
Alkaline Phosphatase: 57 U/L (ref 38–126)
Anion gap: 5 (ref 5–15)
BUN: 10 mg/dL (ref 6–20)
CHLORIDE: 102 mmol/L (ref 101–111)
CO2: 37 mmol/L — ABNORMAL HIGH (ref 22–32)
Calcium: 8.6 mg/dL — ABNORMAL LOW (ref 8.9–10.3)
Creatinine, Ser: 0.54 mg/dL — ABNORMAL LOW (ref 0.61–1.24)
GFR calc non Af Amer: >60 mL/min (ref >60)
Glucose, Bld: 126 mg/dL — ABNORMAL HIGH (ref 65–99)
POTASSIUM: 4.2 mmol/L (ref 3.5–5.1)
Sodium: 144 mmol/L (ref 135–145)
Total Bilirubin: 0.6 mg/dL (ref 0.3–1.2)
Total Protein: 5.1 g/dL — ABNORMAL LOW (ref 6.5–8.1)

## 2017-05-28 LAB — PROTIME-INR
INR: 0.97
PROTHROMBIN TIME: 12.8 s (ref 11.4–15.2)

## 2017-05-28 LAB — MAGNESIUM: MAGNESIUM: 2 mg/dL (ref 1.7–2.4)

## 2017-05-28 LAB — HEPARIN LEVEL (UNFRACTIONATED): HEPARIN UNFRACTIONATED: 0.25 [IU]/mL — AB (ref 0.30–0.70)

## 2017-05-28 LAB — TSH: TSH: 1.109 u[IU]/mL (ref 0.350–4.500)

## 2017-05-28 LAB — HEMOGLOBIN A1C
HEMOGLOBIN A1C: 6.3 % — AB (ref 4.8–5.6)
MEAN PLASMA GLUCOSE: 134.11 mg/dL

## 2017-05-28 LAB — PHOSPHORUS: PHOSPHORUS: 3.5 mg/dL (ref 2.5–4.6)

## 2017-05-29 LAB — CBC
HEMATOCRIT: 44.1 % (ref 39.0–52.0)
Hemoglobin: 14.5 g/dL (ref 13.0–17.0)
MCH: 32.7 pg (ref 26.0–34.0)
MCHC: 32.9 g/dL (ref 30.0–36.0)
MCV: 99.3 fL (ref 78.0–100.0)
PLATELETS: 145 10*3/uL — AB (ref 150–400)
RBC: 4.44 MIL/uL (ref 4.22–5.81)
RDW: 14 % (ref 11.5–15.5)
WBC: 8.5 10*3/uL (ref 4.0–10.5)

## 2017-05-29 LAB — BASIC METABOLIC PANEL
Anion gap: 8 (ref 5–15)
BUN: 5 mg/dL — AB (ref 6–20)
CO2: 33 mmol/L — ABNORMAL HIGH (ref 22–32)
CREATININE: 0.45 mg/dL — AB (ref 0.61–1.24)
Calcium: 8.4 mg/dL — ABNORMAL LOW (ref 8.9–10.3)
Chloride: 101 mmol/L (ref 101–111)
GFR calc Af Amer: >60 mL/min (ref >60)
GFR calc non Af Amer: >60 mL/min (ref >60)
GLUCOSE: 101 mg/dL — AB (ref 65–99)
Potassium: 3.1 mmol/L — ABNORMAL LOW (ref 3.5–5.1)
Sodium: 142 mmol/L (ref 135–145)

## 2017-05-29 LAB — PROTIME-INR
INR: 1.06
PROTHROMBIN TIME: 13.7 s (ref 11.4–15.2)

## 2017-05-29 LAB — HEPARIN LEVEL (UNFRACTIONATED): HEPARIN UNFRACTIONATED: 0.29 [IU]/mL — AB (ref 0.30–0.70)

## 2017-05-29 NOTE — Progress Notes (Signed)
   Plan for aortogram with ble runoff and possible intervention on left tomorrow. Discussed with patient and he agrees to proceed. Npo past midnight.   Maxene Byington C. Donzetta Matters, MD [facility] [place] Office: [phone] Pager: [phone]

## 2017-05-30 ENCOUNTER — Encounter (HOSPITAL_COMMUNITY)
Admission: RE | Disposition: A | Discharge status: Other Acute Inpatient Hospital | Payer: Self-pay | Source: Ambulatory Visit | Attending: Vascular Surgery

## 2017-05-30 ENCOUNTER — Ambulatory Visit (HOSPITAL_COMMUNITY)
Admission: RE | Admit: 2017-05-30 | Discharge: 2017-05-30 | Disposition: A | Discharge status: Other Acute Inpatient Hospital | Payer: Medicare Other | Source: Ambulatory Visit | Attending: Vascular Surgery | Admitting: Vascular Surgery

## 2017-05-30 ENCOUNTER — Encounter
Admission: AD | Disposition: A | Discharge status: Other Acute Inpatient Hospital | Payer: Self-pay | Source: Ambulatory Visit | Attending: Internal Medicine

## 2017-05-30 ENCOUNTER — Ambulatory Visit (HOSPITAL_COMMUNITY): Admit: 2017-05-30 | Payer: Medicare Other | Admitting: Vascular Surgery

## 2017-05-30 ENCOUNTER — Inpatient Hospital Stay
Admission: RE | Admit: 2017-05-30 | Discharge: 2017-06-15 | Disposition: A | Discharge status: Home Health | Payer: Medicare Other | Source: Other Acute Inpatient Hospital | Attending: Urology | Admitting: Urology

## 2017-05-30 DIAGNOSIS — E1151 Type 2 diabetes mellitus with diabetic peripheral angiopathy without gangrene: Secondary | ICD-10-CM | POA: Diagnosis not present

## 2017-05-30 DIAGNOSIS — J449 Chronic obstructive pulmonary disease, unspecified: Secondary | ICD-10-CM | POA: Diagnosis not present

## 2017-05-30 DIAGNOSIS — I10 Essential (primary) hypertension: Secondary | ICD-10-CM | POA: Insufficient documentation

## 2017-05-30 DIAGNOSIS — E785 Hyperlipidemia, unspecified: Secondary | ICD-10-CM | POA: Insufficient documentation

## 2017-05-30 DIAGNOSIS — Z7951 Long term (current) use of inhaled steroids: Secondary | ICD-10-CM | POA: Insufficient documentation

## 2017-05-30 DIAGNOSIS — I70223 Atherosclerosis of native arteries of extremities with rest pain, bilateral legs: Secondary | ICD-10-CM | POA: Insufficient documentation

## 2017-05-30 DIAGNOSIS — F419 Anxiety disorder, unspecified: Secondary | ICD-10-CM | POA: Insufficient documentation

## 2017-05-30 DIAGNOSIS — Z01810 Encounter for preprocedural cardiovascular examination: Secondary | ICD-10-CM

## 2017-05-30 DIAGNOSIS — Z930 Tracheostomy status: Secondary | ICD-10-CM | POA: Insufficient documentation

## 2017-05-30 DIAGNOSIS — K219 Gastro-esophageal reflux disease without esophagitis: Secondary | ICD-10-CM | POA: Insufficient documentation

## 2017-05-30 DIAGNOSIS — I82622 Acute embolism and thrombosis of deep veins of left upper extremity: Secondary | ICD-10-CM | POA: Insufficient documentation

## 2017-05-30 DIAGNOSIS — M19072 Primary osteoarthritis, left ankle and foot: Secondary | ICD-10-CM | POA: Diagnosis not present

## 2017-05-30 DIAGNOSIS — F17210 Nicotine dependence, cigarettes, uncomplicated: Secondary | ICD-10-CM | POA: Diagnosis not present

## 2017-05-30 DIAGNOSIS — I739 Peripheral vascular disease, unspecified: Secondary | ICD-10-CM | POA: Diagnosis present

## 2017-05-30 HISTORY — PX: ABDOMINAL AORTOGRAM W/LOWER EXTREMITY: CATH118223

## 2017-05-30 LAB — CBC WITH DIFFERENTIAL/PLATELET
BASOS PCT: 0 %
Basophils Absolute: 0 10*3/uL (ref 0.0–0.1)
EOS ABS: 0.1 10*3/uL (ref 0.0–0.7)
EOS PCT: 1 %
HCT: 39.9 % (ref 39.0–52.0)
HEMOGLOBIN: 13.1 g/dL (ref 13.0–17.0)
Lymphocytes Relative: 28 %
Lymphs Abs: 2.2 10*3/uL (ref 0.7–4.0)
MCH: 32.6 pg (ref 26.0–34.0)
MCHC: 32.8 g/dL (ref 30.0–36.0)
MCV: 99.3 fL (ref 78.0–100.0)
MONOS PCT: 8 %
Monocytes Absolute: 0.6 10*3/uL (ref 0.1–1.0)
NEUTROS PCT: 63 %
Neutro Abs: 4.8 10*3/uL (ref 1.7–7.7)
PLATELETS: 123 10*3/uL — AB (ref 150–400)
RBC: 4.02 MIL/uL — ABNORMAL LOW (ref 4.22–5.81)
RDW: 13.7 % (ref 11.5–15.5)
WBC: 7.7 10*3/uL (ref 4.0–10.5)

## 2017-05-30 LAB — APTT: APTT: 27 s (ref 24–36)

## 2017-05-30 LAB — POTASSIUM: Potassium: 3.5 mmol/L (ref 3.5–5.1)

## 2017-05-30 LAB — PROTIME-INR
INR: 0.97
PROTHROMBIN TIME: 12.8 s (ref 11.4–15.2)

## 2017-05-30 LAB — HEPARIN LEVEL (UNFRACTIONATED)

## 2017-05-30 SURGERY — ABDOMINAL AORTOGRAM W/LOWER EXTREMITY
Anesthesia: LOCAL

## 2017-05-30 MED ORDER — LIDOCAINE HCL (PF) 1 % IJ SOLN
INTRAMUSCULAR | Status: DC | PRN
  Administered 2017-05-30: 30 mL via INTRADERMAL

## 2017-05-30 MED ORDER — MORPHINE SULFATE (PF) 10 MG/ML IV SOLN: 2.0000 mg | INTRAVENOUS | Status: DC | PRN

## 2017-05-30 MED ORDER — IODIXANOL 320 MG/ML IV SOLN
INTRAVENOUS | Status: DC | PRN
  Administered 2017-05-30: 190 mL via INTRA_ARTERIAL

## 2017-05-30 MED ORDER — HEPARIN (PORCINE) IN NACL 2-0.9 UNIT/ML-% IJ SOLN
INTRAMUSCULAR | Status: AC
  Filled 2017-05-30: qty 1000

## 2017-05-30 MED ORDER — HEPARIN (PORCINE) IN NACL 2-0.9 UNIT/ML-% IJ SOLN
INTRAMUSCULAR | Status: AC | PRN
  Administered 2017-05-30: 1000 mL via INTRA_ARTERIAL

## 2017-05-30 MED ORDER — LIDOCAINE HCL (PF) 1 % IJ SOLN
INTRAMUSCULAR | Status: AC
  Filled 2017-05-30: qty 30

## 2017-05-30 MED ORDER — SODIUM CHLORIDE 0.9 % IV SOLN: INTRAVENOUS | Status: DC

## 2017-05-30 SURGICAL SUPPLY — 9 items
CATH ANGIO 5F PIGTAIL 65CM (CATHETERS) ×1 IMPLANT
COVER PRB 48X5XTLSCP FOLD TPE (BAG) IMPLANT
COVER PROBE 5X48 (BAG) ×2
KIT PV (KITS) ×2 IMPLANT
SHEATH PINNACLE 5F 10CM (SHEATH) ×1 IMPLANT
SYR MEDRAD MARK V 150ML (SYRINGE) ×2 IMPLANT
TRANSDUCER W/STOPCOCK (MISCELLANEOUS) ×2 IMPLANT
TRAY PV CATH (CUSTOM PROCEDURE TRAY) ×2 IMPLANT
WIRE HITORQ VERSACORE ST 145CM (WIRE) ×1 IMPLANT

## 2017-05-30 NOTE — Discharge Instructions (Signed)
Angiogram, Care After °This sheet gives you information about how to care for yourself after your procedure. Your health care provider may also give you more specific instructions. If you have problems or questions, contact your health care provider. °What can I expect after the procedure? °After the procedure, it is common to have bruising and tenderness at the catheter insertion area. °Follow these instructions at home: °Insertion site care  °· Follow instructions from your health care provider about how to take care of your insertion site. Make sure you: °¨ Wash your hands with soap and water before you change your bandage (dressing). If soap and water are not available, use hand sanitizer. °¨ Change your dressing as told by your health care provider. °¨ Leave stitches (sutures), skin glue, or adhesive strips in place. These skin closures may need to stay in place for 2 weeks or longer. If adhesive strip edges start to loosen and curl up, you may trim the loose edges. Do not remove adhesive strips completely unless your health care provider tells you to do that. °· Do not take baths, swim, or use a hot tub until your health care provider approves. °· You may shower 24-48 hours after the procedure or as told by your health care provider. °¨ Gently wash the site with plain soap and water. °¨ Pat the area dry with a clean towel. °¨ Do not rub the site. This may cause bleeding. °· Do not apply powder or lotion to the site. Keep the site clean and dry. °· Check your insertion site every day for signs of infection. Check for: °¨ Redness, swelling, or pain. °¨ Fluid or blood. °¨ Warmth. °¨ Pus or a bad smell. °Activity  °· Rest as told by your health care provider, usually for 1-2 days. °· Do not lift anything that is heavier than 10 lbs. (4.5 kg) or as told by your health care provider. °· Do not drive for 24 hours if you were given a medicine to help you relax (sedative). °· Do not drive or use heavy machinery while  taking prescription pain medicine. °General instructions  °· Return to your normal activities as told by your health care provider, usually in about a week. Ask your health care provider what activities are safe for you. °· If the catheter site starts bleeding, lie flat and put pressure on the site. If the bleeding does not stop, get help right away. This is a medical emergency. °· Drink enough fluid to keep your urine clear or pale yellow. This helps flush the contrast dye from your body. °· Take over-the-counter and prescription medicines only as told by your health care provider. °· Keep all follow-up visits as told by your health care provider. This is important. °Contact a health care provider if: °· You have a fever or chills. °· You have redness, swelling, or pain around your insertion site. °· You have fluid or blood coming from your insertion site. °· The insertion site feels warm to the touch. °· You have pus or a bad smell coming from your insertion site. °· You have bruising around the insertion site. °· You notice blood collecting in the tissue around the catheter site (hematoma). The hematoma may be painful to the touch. °Get help right away if: °· You have severe pain at the catheter insertion area. °· The catheter insertion area swells very fast. °· The catheter insertion area is bleeding, and the bleeding does not stop when you hold steady pressure on   the area. °· The area near or just beyond the catheter insertion site becomes pale, cool, tingly, or numb. °These symptoms may represent a serious problem that is an emergency. Do not wait to see if the symptoms will go away. Get medical help right away. Call your local emergency services (911 in the [place] Do not drive yourself to the hospital. °Summary °· After the procedure, it is common to have bruising and tenderness at the catheter insertion area. °· After the procedure, it is important to rest and drink plenty of fluids. °· Do not take baths,  swim, or use a hot tub until your health care provider says it is okay to do so. You may shower 24-48 hours after the procedure or as told by your health care provider. °· If the catheter site starts bleeding, lie flat and put pressure on the site. If the bleeding does not stop, get help right away. This is a medical emergency. °This information is not intended to replace advice given to you by your health care provider. Make sure you discuss any questions you have with your health care provider. °Document Released: 03/28/2005 Document Revised: 08/14/2016 Document Reviewed: 08/14/2016 °Elsevier Interactive Patient Education © 2017 [employer] ° °

## 2017-05-30 NOTE — Op Note (Addendum)
Procedure: Abdominal aortogram with bilateral lower extremity runoff  Preoperative diagnosis: Rest pain bilateral feet left greater than right  Postoperative diagnosis: Same  Anesthesia: Local  Operative findings: #1 right leg right above-knee popliteal occlusion with reconstitution of the below-knee popliteal artery is one-vessel runoff posterior tibial artery #2 left leg occlusion above-knee popliteal artery reconstitution of very diseased below-knee popliteal artery one-vessel runoff posterior tibial artery to the left foot  Operative details: After obtaining informed consent, the patient was brought to the [facility] lab. The patient was placed in supine position on the Angio table. Both groins were prepped and draped in usual sterile fashion. Ultrasound was used to identify the right common femoral artery and femoral bifurcation. Local anesthesia was infiltrated over this. An introducer needle was then used to cannulate the right common femoral artery without difficulty. An 035 versacore wire was then threaded up in the abdominal aorta under fluoroscopic guidance. A 5 French sheathover the guidewire in the right common femoral artery and thoroughly flushed with heparinized saline. A 5 French pigtail catheter was then advanced over the guidewire into the abdominal aorta and abdominal aortogram was obtained in AP projection. The left and right renal arteries are patent. The infrarenal abdominal aorta internal iliac common iliac and external iliac arteries are all widely patent without flow-limiting stenosis.  Next the pectoral catheter was pulled down just above the aortic bifurcation and bilateral lower extremity runoff views were obtained. These were done through the pigtail catheter.  In the left lower extremity, the left common femoral profunda femoris and superficial femoral arteries are patent. The above-knee popliteal artery is occluded. Due to patient movement and washout of contrast the tibial  vessels were not well visualized initially.  In the right lower extremity, the right common femoral profunda femoris and superficial femoral arteries are patent. The above-knee popliteal artery occludes. Similarly on the right leg due to movement contrast washout the tibial vessels were not well visualized.  At this point, additional views were obtained to the pigtail catheter starting at the tibial plateau. In the left leg the left below-knee popliteal artery does reconstitute via collaterals but appears to be a very diseased vessel. The anterior tibial artery is occluded. The peroneal artery is diminutive. The posterior tibial artery does reconstitute by what appears to be either the native tibioperoneal trunk or a collateral from the below-knee popliteal artery. The posterior tibial artery is a very large vessel which provides flow all the way down into the left foot.  In the right lower extremity, the right below-knee popliteal artery does reconstitute. The origins of all 3 tibial vessels are patent. However the anterior tibial artery occludes in the proximal leg. The posterior tibial artery proceeds all the way down to the right foot. The peroneal artery is patent but diminutive.  At this point the 5 French pigtail catheter was removed over a guidewire. A 5 French sheath was then removed and hemostasis obtained with direct pressure. The patient however the procedure well and there were no complications. The patient was taken to the holding area in stable condition.  Operative management: The patient will be scheduled for a left popliteal to posterior tibial artery bypass by Dr. Donzetta Matters most likely next Thursday, 06/05/2017.  Ruta Hinds, MD [facility] [place] Office: [phone] Pager: [phone]

## 2017-05-30 NOTE — H&P (View-Only) (Signed)
Hospital Consult    Reason for Consult:  Cold left leg Requesting team:  PCCM Salvadore Dom) MRN #:  [id number]  History of Present Illness: This is a 51 y.o. male admitted with mrsa bronchitis. He is now trached and more alert and awake and notices coolness to his left foot. He tells me this has happened before. He is diabetic and a smoker. No previous vascular surgery but does have left ankle surgery in the past. His left foot does not hurt at this time and he can move it without limitation and it feels normal when touched. Heparin is on for left arm dvt but has been held for placement of central venous line.   Past Medical History:  Diagnosis Date  . Anxiety   . Arthritis    Left ankle  . COPD (chronic obstructive pulmonary disease) ([place])   . Drug abuse   . Gait disturbance   . GERD (gastroesophageal reflux disease)   . 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  Marland Kitchen Hyperlipidemia   . Hypertension   . 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 Memory loss   . Peripheral vision loss    Left due to brain surgery  . Pneumonia   . Polycythemia   . Prediabetes   . Seizures ([place]) 11/19/2013   none since 2016  . Shortness of breath    Hx of smoking.    Past Surgical History:  Procedure Laterality Date  . ANKLE ARTHROSCOPY  01/17/2012   Procedure: ANKLE ARTHROSCOPY;  Surgeon: [name], MD;  Location: [facility];  Service: Orthopedics;  Laterality: Left;  . ANKLE ARTHROSCOPY WITH FUSION Left 10/02/2016   Procedure: Left Posterior Subtalar Arthrodesis Arthroscopy;  Surgeon: [name], MD;  Location: [facility];  Service: Orthopedics;  Laterality: Left;  . ANKLE FUSION Left 11/2012   Dr Sharol Given  . ANKLE SURGERY  2003   Left  . BRAIN SURGERY  2009   Biopsy  . FRACTURE SURGERY  2003   Left ankle  . HARDWARE REMOVAL Left 12/08/2012   Procedure: HARDWARE REMOVAL;  Surgeon: [name], MD;  Location: [facility];  Service:  Orthopedics;  Laterality: Left;  Removal Deep Hardware, Take Down Non-Union, Revision Internal Fixation Left Ankle  . I&D EXTREMITY Left 01/01/2013   Procedure: IRRIGATION AND DEBRIDEMENT EXTREMITY;  Surgeon: [name], MD;  Location: [facility];  Service: Orthopedics;  Laterality: Left;  Irrigation, Debridement and Placement Antibiotic Beads Left Ankle  . INNER EAR SURGERY    . OPEN REDUCTION INTERNAL FIXATION (ORIF) SCAPHOID WITH DISTAL RADIUS GRAFT Right 02/01/2013   Procedure: OPEN REDUCTION INTERNAL FIXATION (ORIF) RIGHT SCAPHOID FRACTURE WITH DISTAL RADIUS GRAFT;  Surgeon: Schuyler Amor, MD;  Location: [facility];  Service: Orthopedics;  Laterality: Right;  . ORIF ANKLE FRACTURE Left 12/08/2012   Procedure: OPEN REDUCTION INTERNAL FIXATION (ORIF) ANKLE FRACTURE;  Surgeon: [name], MD;  Location: [facility];  Service: Orthopedics;  Laterality: Left;  Removal Deep Hardware, Take Down Non-Union, Revision Internal Fixation Left Ankle  . ORIF ANKLE FRACTURE Right 09/03/2013   Procedure: OPEN REDUCTION INTERNAL FIXATION (ORIF) ANKLE FRACTURE;  Surgeon: [name], MD;  Location: [facility];  Service: Orthopedics;  Laterality: Right;  Open Reduction Internal Fixation Right Fibula  . SCAPHOID FRACTURE SURGERY  02/04/2013  . SHOULDER ARTHROSCOPY  2008   Left  . [place]  Allergies  Allergen Reactions  . Hydrocodone Rash  . No Known Allergies     Prior to Admission medications   Medication Sig Start Date End Date Taking? Authorizing Provider  acetaminophen (TYLENOL) 500 MG tablet Take 1,500 mg by mouth every 6 (six) hours as needed for mild pain.    Yes [provider]  albuterol (PROVENTIL HFA;VENTOLIN HFA) 108 (90 Base) MCG/ACT inhaler Inhale 2 puffs into the lungs every 4 (four) hours as needed for wheezing or shortness of breath (cough, shortness of breath or wheezing.). 11/07/16  Yes Lauree Chandler, NP  ALPRAZolam Duanne Moron) 1 MG tablet Take 1 mg by mouth at  bedtime as needed for anxiety.   Yes [provider]  buPROPion (ZYBAN) 150 MG 12 hr tablet TAKE 1 TABLET(150 MG) BY MOUTH TWICE DAILY 02/05/17  Yes Dondra Prader R, NP  cyclobenzaprine (FLEXERIL) 10 MG tablet Take 10 mg by mouth 3 (three) times daily as needed for muscle spasms.   Yes [provider]  diclofenac sodium (VOLTAREN) 1 % GEL APPLY 2 TO 4 GRAMS EXTERNALLY TO THE AFFECTED AREA TWICE DAILY 01/07/17  Yes [name], MD  divalproex (DEPAKOTE ER) 500 MG 24 hr tablet Take 2 tablets (1,000 mg total) by mouth daily. 11/22/16  Yes Tomi Likens, Adam R, DO  docusate sodium (COLACE) 100 MG capsule Take 100 mg by mouth 2 (two) times daily as needed for mild constipation.    Yes [provider]  Fluticasone-Salmeterol (ADVAIR DISKUS) 250-50 MCG/DOSE AEPB Inhale 1 puff into the lungs 2 (two) times daily. 11/07/16  Yes Lauree Chandler, NP  gabapentin (NEURONTIN) 300 MG capsule TAKE 1 CAPSULE(300 MG) BY MOUTH THREE TIMES DAILY Patient taking differently: TAKE 1 CAPSULE(300 MG) BY MOUTH TWICE DAILY 04/11/17  Yes Dondra Prader R, NP  Lacosamide (VIMPAT) 100 MG TABS Take 1 tablet (100 mg total) by mouth 2 (two) times daily. 11/22/16  Yes Jaffe, Adam R, DO  lisinopril-hydrochlorothiazide (PRINZIDE,ZESTORETIC) 20-25 MG tablet TAKE 1 TABLET BY MOUTH DAILY   Yes [provider]  meloxicam (MOBIC) 15 MG tablet Take 15 mg by mouth daily.   Yes [provider]  methocarbamol (ROBAXIN) 500 MG tablet TAKE 500MG  BY MOUTH EVERY 6 TO 8 HOURS AS NEEDED FOR MUSCLE SPASMS   Yes [provider]  Multiple Vitamin (MULTIVITAMIN WITH MINERALS) TABS tablet Take 1 tablet by mouth daily. 07/04/16  Yes Johnson, Clanford L, MD  nabumetone (RELAFEN) 500 MG tablet Take 1 tablet (500 mg total) by mouth 2 (two) times daily as needed. 03/28/17  Yes [name], MD  naproxen (NAPROSYN) 500 MG tablet TAKE 1 TABLET(500 MG) BY MOUTH TWICE DAILY WITH MEALS AS NEEDED FOR MILD PAIN OR MODERATE PAIN  11/15/16  Yes Dondra Prader R, NP  traZODone (DESYREL) 50 MG tablet Take 1/2 tablet by mouth twice daily as needed for anxiety. Stop Date 11/23/16 11/07/16 05/10/17 Yes Lauree Chandler, NP    Social History   Social History  . Marital status: Married    Spouse name: Vickie  . Number of children: 1  . Years of education: 96   Occupational History  .  Not Employed    Disabled   Social History Main Topics  . Smoking status: Current Every Day Smoker    Packs/day: 0.25    Years: 30.00    Types: Cigarettes  . Smokeless tobacco: Never Used  . Alcohol use No     Comment: quit Oct. 2017(heavy drinker) .In [place]  .  Drug use: No  . Sexual activity: Not Currently   Other Topics Concern  . Not on file   Social History Narrative   Patient lives at home with his wife Maxwell Caul)    Disabled   Left handed   Education 12 th    Caffeine Coffee four cups     Family History  Problem Relation Age of Onset  . Hypertension Mother   . Bronchitis Mother   . Cancer Maternal Aunt   . Cancer Maternal Grandmother   . Stroke Neg Hx   . Diabetes Neg Hx    REVIEW OF SYSTEMS (negative unless checked):   Cardiac:  []  Chest pain or chest pressure? []  Shortness of breath upon activity? []  Shortness of breath when lying flat? []  Irregular heart rhythm?  Vascular:  []  Pain in calf, thigh, or hip brought on by walking? []  Pain in feet at night that wakes you up from your sleep? [x]  Blood clot in your veins? [x]  Leg swelling?  Pulmonary:  []  Oxygen at home? []  Productive cough? []  Wheezing?  Neurologic:  []  Sudden weakness in arms or legs? []  Sudden numbness in arms or legs? []  Sudden onset of difficult speaking or slurred speech? []  Temporary loss of vision in one eye? []  Problems with dizziness?  Gastrointestinal:  []  Blood in stool? [x]  diarrhea  Genitourinary:  []  Burning when urinating? []  Blood in urine?  Psychiatric:  []  Major depression  Hematologic:  []  Bleeding  problems? []  Problems with blood clotting?  Dermatologic:  []  Rashes or ulcers?  Constitutional:  []  Fever or chills?  Ear/Nose/Throat:  []  Change in hearing? []  Nose bleeds? []  Sore throat?  Musculoskeletal:  []  Back pain? []  Joint pain? []  Muscle pain?    Physical Examination  Vitals:   05/25/17 1000 05/25/17 1146  BP: 125/83   Pulse: (!) 110   Resp: 17   Temp:  98.9 F (37.2 C)  SpO2: 98%    Body mass index is 32.2 kg/m.  General:  Awake and alert, nad HENT: WNL, normocephalic Pulmonary: normal non-labored breathing, trached Cardiac: tachycardic 2+ femoral pulses Non palpable popliteal pulses Pt signals are monophasic bilaterally, no dp or peroneal signals Abdomen:soft, no masses Extremities: right foot is warm, cap refill <2s Left foot is cool to touch particularly at the level of the toes, cap refill is sluggish, there are 2 well healed incisions from ankle surgery Neurologic: both feet have normal sensation and motor strength Psychiatric:  Appropriate mood and affect  CBC    Component Value Date/Time   WBC 10.4 05/24/2017 0227   RBC 4.29 05/24/2017 0227   HGB 13.8 05/24/2017 0227   HCT 44.3 05/24/2017 0227   PLT 142 (L) 05/24/2017 0227   MCV 103.3 (H) 05/24/2017 0227   MCH 32.2 05/24/2017 0227   MCHC 31.2 05/24/2017 0227   RDW 13.9 05/24/2017 0227   LYMPHSABS 2.7 05/24/2017 0227   MONOABS 0.7 05/24/2017 0227   EOSABS 0.1 05/24/2017 0227   BASOSABS 0.0 05/24/2017 0227    BMET    Component Value Date/Time   NA 144 05/25/2017 0502   K 3.5 05/25/2017 0502   CL 108 05/25/2017 0502   CO2 29 05/25/2017 0502   GLUCOSE 132 (H) 05/25/2017 0502   BUN 15 05/25/2017 0502   CREATININE 0.45 (L) 05/25/2017 0502   CREATININE 1.06 11/22/2013 1723   CALCIUM 7.9 (L) 05/25/2017 0502   GFRNONAA >60 05/25/2017 0502   GFRNONAA 82 11/22/2013 1723   GFRAA >60  05/25/2017 0502   GFRAA >89 11/22/2013 1723    COAGS: Lab Results  Component Value Date    INR 1.07 05/23/2017   INR 0.98 09/03/2013   INR 2.11 (H) 02/05/2013     Non-Invasive Vascular Imaging:   Summary: Findings consistent with subacute deep vein thrombosis involving the subclavian, axillary and brachial veins of the left upper extremity.   ASSESSMENT/PLAN: 51 y.o. male here with bronchitis now complicated by C. difficile and now has tracheostomy in place. He is on heparin for left upper extremity DVT. This is currently held for central venous line placement which should be resumed after that. He should also be on aspirin given that he likely has significant vascular disease with history of smoking and diabetes. His left foot is cool to touch but his sensory and motor intact at this time and there is a signal at the ankle posterior tibial artery which is consistent with the contralateral exam. From this standpoint he does not appear to have any limb threatening acute limb ischemia. We will get left lower extremity duplex bilateral ABIs. He will need at least angiography during this hospitalization to evaluate his left lower extremity. Should he have physical exam change this can be done on a more urgent basis. Will continue to follow.  Brandon C. Donzetta Matters, MD [facility] Office: [phone] Pager: [phone]   Addendum:  He has an incidentally found left leg dvt, already on systemic anticoagulation for left arm dvt and unlikely contributing to symptoms. The LLE duplex demonstrates collateral flow consistent with a chronic process although he has minimal flow in his foot consistent with physical exam. Given lack of neuro symptoms in his foot will continue to monitor and plan for elective angiogram prior to discharge.   Brandon C. Donzetta Matters, MD

## 2017-05-30 NOTE — Interval H&P Note (Signed)
History and Physical Interval Note:  05/30/2017 4:14 PM  Harold Bailey  has presented today for surgery, with the diagnosis of pvd  The various methods of treatment have been discussed with the patient and family. After consideration of risks, benefits and other options for treatment, the patient has consented to  Procedure(s): ABDOMINAL AORTOGRAM W/LOWER EXTREMITY (N/A) as a surgical intervention .  The patient's history has been reviewed, patient examined, no change in status, stable for surgery.  I have reviewed the patient's chart and labs.  Questions were answered to the patient's satisfaction.     Ruta Hinds

## 2017-05-31 ENCOUNTER — Encounter (HOSPITAL_BASED_OUTPATIENT_CLINIC_OR_DEPARTMENT_OTHER): Payer: Medicare Other

## 2017-05-31 DIAGNOSIS — Z01810 Encounter for preprocedural cardiovascular examination: Secondary | ICD-10-CM

## 2017-05-31 LAB — HEPARIN LEVEL (UNFRACTIONATED)

## 2017-05-31 NOTE — Progress Notes (Signed)
Right Lower Extremity Vein Map    Right Great Saphenous Vein   Segment Diameter Comment  1. Origin 4.41mm   2. High Thigh 4.40mm   3. Mid Thigh 41mm   4. Low Thigh 4.15mm   5. At Knee 3.32mm   6. High Calf 2.71mm branch  7. Low Calf 2.16mm branch  8. Ankle 2.3mm    mm    mm    mm      Left Lower Extremity Vein Map    Left Great Saphenous Vein   Segment Diameter Comment  1. Origin 4.75mm   2. High Thigh 4.7mm   3. Mid Thigh 3.51mm   4. Low Thigh 2.37mm   5. At Knee 3.56mm   6. High Calf 1.63mm branch  7. Low Calf 2.40mm   8. Ankle 2.73mm    mm    mm    mm

## 2017-06-02 ENCOUNTER — Encounter (HOSPITAL_COMMUNITY): Payer: Self-pay | Admitting: Vascular Surgery

## 2017-06-03 ENCOUNTER — Telehealth: Payer: Self-pay | Admitting: Vascular Surgery

## 2017-06-03 ENCOUNTER — Ambulatory Visit: Payer: Medicare Other | Admitting: Neurology

## 2017-06-03 NOTE — Telephone Encounter (Signed)
-----   Message from Denman George, RN sent at 06/03/2017 10:47 AM EDT ----- Regarding: needs appt. with Dr. Donzetta Matters in about 2-3 weeks.  Per Dr. Donzetta Matters, this pt. needs to f/u in the office in 2-3 weeks to discuss surgery.  He is currently in [facility]; [place].

## 2017-06-03 NOTE — Progress Notes (Signed)
  Progress Note    Discussed plan with patient and at this time we will defer surgery until he is stronger. Will f/u in office in 2-4 weeks.    Raif Chachere C. Donzetta Matters, MD [facility] Office: [phone] Pager: [phone]  06/03/2017 10:39 AM

## 2017-06-03 NOTE — Telephone Encounter (Signed)
Sched appt 06/27/17 at 8:45. Spoke to pt.

## 2017-06-05 ENCOUNTER — Encounter
Admission: RE | Disposition: A | Discharge status: Home Health | Payer: Self-pay | Source: Other Acute Inpatient Hospital | Attending: Urology

## 2017-06-05 SURGERY — CREATION, BYPASS, ARTERIAL, POPLITEAL TO TIBIAL, USING GRAFT
Anesthesia: General | Site: Leg Lower | Laterality: Left

## 2017-06-08 LAB — CBC
HCT: 48.4 % (ref 39.0–52.0)
Hemoglobin: 15.4 g/dL (ref 13.0–17.0)
MCH: 32.2 pg (ref 26.0–34.0)
MCHC: 31.8 g/dL (ref 30.0–36.0)
MCV: 101.3 fL — ABNORMAL HIGH (ref 78.0–100.0)
PLATELETS: 193 10*3/uL (ref 150–400)
RBC: 4.78 MIL/uL (ref 4.22–5.81)
RDW: 13.9 % (ref 11.5–15.5)
WBC: 8.2 10*3/uL (ref 4.0–10.5)

## 2017-06-08 LAB — BASIC METABOLIC PANEL
ANION GAP: 8 (ref 5–15)
BUN: 17 mg/dL (ref 6–20)
CALCIUM: 8.8 mg/dL — AB (ref 8.9–10.3)
CO2: 28 mmol/L (ref 22–32)
Chloride: 101 mmol/L (ref 101–111)
Creatinine, Ser: 0.69 mg/dL (ref 0.61–1.24)
GFR calc Af Amer: >60 mL/min (ref >60)
GLUCOSE: 114 mg/dL — AB (ref 65–99)
Potassium: 4.4 mmol/L (ref 3.5–5.1)
SODIUM: 137 mmol/L (ref 135–145)

## 2017-06-10 LAB — HEMOGLOBIN A1C
HEMOGLOBIN A1C: 6.3 % — AB (ref 4.8–5.6)
MEAN PLASMA GLUCOSE: 134.11 mg/dL

## 2017-06-16 ENCOUNTER — Telehealth (INDEPENDENT_AMBULATORY_CARE_PROVIDER_SITE_OTHER): Payer: Self-pay | Admitting: Radiology

## 2017-06-16 NOTE — Telephone Encounter (Signed)
Patient states that when he was in here about a year ago with something on the outside of his foot and Dr. Sharol Given said her could not take care of that, but he is wanting to know if Dr. Sharol Given could refer him to a podiatrist for this. He is trying to find out who he had seen in the past.  Please call him back.

## 2017-06-16 NOTE — Telephone Encounter (Signed)
I called and left patient voicemail for message below.

## 2017-06-16 NOTE — Telephone Encounter (Signed)
Have him come in so we can evaluate his foot. I will see him for his foot problem.

## 2017-06-23 ENCOUNTER — Encounter (HOSPITAL_COMMUNITY): Payer: Self-pay | Admitting: Emergency Medicine

## 2017-06-23 ENCOUNTER — Encounter (INDEPENDENT_AMBULATORY_CARE_PROVIDER_SITE_OTHER): Payer: Self-pay | Admitting: Orthopedic Surgery

## 2017-06-23 ENCOUNTER — Ambulatory Visit (INDEPENDENT_AMBULATORY_CARE_PROVIDER_SITE_OTHER): Payer: Medicare Other | Admitting: Orthopedic Surgery

## 2017-06-23 ENCOUNTER — Emergency Department (HOSPITAL_COMMUNITY)
Admission: EM | Admit: 2017-06-23 | Discharge: 2017-06-23 | Disposition: A | Discharge status: Home / Self Care | Payer: Medicare Other | Attending: Emergency Medicine | Admitting: Emergency Medicine

## 2017-06-23 DIAGNOSIS — M79674 Pain in right toe(s): Secondary | ICD-10-CM | POA: Insufficient documentation

## 2017-06-23 DIAGNOSIS — Z5321 Procedure and treatment not carried out due to patient leaving prior to being seen by health care provider: Secondary | ICD-10-CM | POA: Diagnosis not present

## 2017-06-23 DIAGNOSIS — I82629 Acute embolism and thrombosis of deep veins of unspecified upper extremity: Secondary | ICD-10-CM | POA: Insufficient documentation

## 2017-06-23 DIAGNOSIS — L03031 Cellulitis of right toe: Secondary | ICD-10-CM

## 2017-06-23 NOTE — ED Triage Notes (Signed)
Pt to ER for evaluation of right great toe pain. States is worried it's infected. Toe is red but no drainage or swelling noted. No hx of gout. States severe pain with weight bearing

## 2017-06-23 NOTE — ED Notes (Signed)
Pt states that is is going to see his PCP

## 2017-06-23 NOTE — Progress Notes (Signed)
Office Visit Note   Patient: Harold Bailey           Date of Birth: [birth year]           MRN: [id number] Visit Date: 06/23/2017              Requested by: Everardo Beals, NP [facility] [address] [place],  [address] PCP: Everardo Beals, NP  Chief Complaint  Patient presents with  . Right Great Toe - Follow-up      HPI: The patient is a 51 year old gentleman who presents a complaining of pain to the medial border of this great toenail on the right. States has had ingrown toenails removed on the left previously. This is been ongoing for several weeks is been to the emergency room for the same. However he did not wait to be seen due to a long wait time. He states he has an appointment with his podiatrist in 3 weeks but cannot wait until that time has been taking BC powders for pain. States they're tearing of stomach. Is requesting narcotic pain medication for the pain states he'll be moving soon and needs narcotics to get through his move.  Numerous prescriptions from numerous providers in [place].   Assessment & Plan: Visit Diagnoses:  1. Great toe pain, right     Plan: declined medication for pain medicine. Attempted excision of medial nail plate. Patient moving and not compliant during procedure. Distal half of medial nail plate removed. vaseline gauze and pressure dressing applied. Patient to follow up in 2 weeks for recheck. Cleanse and apply dry dressing daily.   Follow-Up Instructions: Return in about 2 weeks (around 07/07/2017), or if symptoms worsen or fail to improve.   Ortho Exam  Patient is alert, oriented, no adenopathy, well-dressed, normal affect, normal respiratory effort. Right great toe with some erythema and tenderness to medial border. Has colored the medial half of the nail plate with a black marker. Some dried blood to medial border. No purulence or active drainage. No swelling to toe.   Imaging: No results  found. No images are attached to the encounter.  Labs: Lab Results  Component Value Date   HGBA1C 6.3 (H) 06/10/2017   HGBA1C 6.3 (H) 05/28/2017   HGBA1C 5.9 (H) 11/22/2013   REPTSTATUS 05/21/2017 FINAL 05/16/2017   REPTSTATUS 05/21/2017 FINAL 05/16/2017   GRAMSTAIN  05/11/2017    ABUNDANT WBC PRESENT, PREDOMINANTLY PMN FEW GRAM NEGATIVE RODS    CULT NO GROWTH 5 DAYS 05/16/2017   CULT NO GROWTH 5 DAYS 05/16/2017   LABORGA METHICILLIN RESISTANT STAPHYLOCOCCUS AUREUS 05/11/2017    Orders:  No orders of the defined types were placed in this encounter.  No orders of the defined types were placed in this encounter.    Procedures: Nail Removal Date/Time: 06/23/2017 4:02 PM Performed by: Suzan Slick Authorized by: Dondra Prader R   Consent:    Consent obtained:  Verbal   Consent given by:  Patient   Risks discussed:  Incomplete removal, bleeding and infection   Alternatives discussed:  Alternative treatment, no treatment and observation Location:    Foot:  R big toe Pre-procedure details:    Skin preparation:  Betadine Anesthesia (see MAR for exact dosages):    Anesthesia method:  Nerve block   Block needle gauge:  25 G   Block anesthetic:  Lidocaine 1% w/o epi Nail Removal:    Nail removed:  Partial   Nail side:  Medial Post-procedure details:  Dressing:  4x4 sterile gauze and Xeroform gauze   Patient tolerance of procedure:  Procedure terminated at patient's request    Clinical Data: No additional findings.  ROS:  All other systems negative, except as noted in the HPI. Review of Systems  Constitutional: Negative for chills and fever.  Skin: Positive for wound. Negative for color change.    Objective: Vital Signs: There were no vitals taken for this visit.  Specialty Comments:  No specialty comments available.  PMFS History: Patient Active Problem List   Diagnosis Date Noted  . Difficult intravenous access   . Acute on chronic respiratory  failure with hypoxia and hypercapnia (HCC)   . Essential hypertension 05/11/2017  . Pneumonia 05/11/2017  . SOB (shortness of breath)   . Plantar fasciitis of left foot 01/14/2017  . Traumatic arthritis of ankle, right 01/07/2017  . H/O ankle fusion 11/14/2016  . 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 At risk for adverse drug event 10/08/2016  . History of snoring 10/08/2016  . Subtalar joint instability, left   . 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 History of alcohol abuse 07/02/2016  . COPD exacerbation ([place]) 07/02/2016  . Tobacco dependence 06/06/2015  . Medication overuse headache 02/28/2015  . Localization-related symptomatic epilepsy and epileptic syndromes with complex partial seizures, not intractable, without status epilepticus ([place]) 02/28/2015  . Cognitive impairment 02/28/2015  . 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 Tobacco abuse 02/28/2015  . Brain tumor ([place]) 11/19/2013  . Seizures ([place]) 11/19/2013  . Memory loss   . HA (headache)   . Gait disturbance   . Pain in left ankle and joints of left foot 10/15/2013  . Falls frequently 10/15/2013  . Swelling 09/09/2013  . Rash and nonspecific skin eruption 09/09/2013  . Ankle fracture, lateral malleolus, closed 09/03/2013  . Unspecified vitamin D deficiency 08/12/2013  . Polycythemia   . Memory deficit   . History of drug abuse   . GERD (gastroesophageal reflux disease)   . Anxiety   . Arthritis   . Hyperlipidemia   . Prediabetes    Past Medical History:  Diagnosis Date  . Anxiety   . Arthritis    Left ankle  . COPD (chronic obstructive pulmonary disease) ([place])   . Drug abuse ([place])   . Gait disturbance   . GERD (gastroesophageal reflux disease)   . 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  Marland Kitchen Hyperlipidemia   . Hypertension   . 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 Memory loss   . Peripheral vision loss    Left  due to brain surgery  . Pneumonia   . Polycythemia   . Prediabetes   . Seizures ([place]) 11/19/2013   none since 2016  . Shortness of breath    Hx of smoking.    Family History  Problem Relation Age of Onset  . Hypertension Mother   . Bronchitis Mother   . Cancer Maternal Aunt   . Cancer Maternal Grandmother   . Stroke Neg Hx   . Diabetes Neg Hx     Past Surgical History:  Procedure Laterality Date  . ABDOMINAL AORTOGRAM W/LOWER EXTREMITY N/A 05/30/2017   Procedure: ABDOMINAL AORTOGRAM W/LOWER EXTREMITY;  Surgeon: Elam Dutch, MD;  Location: [facility];  Service: Cardiovascular;  Laterality: N/A;  . ANKLE ARTHROSCOPY  01/17/2012   Procedure: ANKLE ARTHROSCOPY;  Surgeon: [name], MD;  Location:  [facility];  Service: Orthopedics;  Laterality: Left;  . ANKLE ARTHROSCOPY WITH FUSION Left 10/02/2016   Procedure: Left Posterior Subtalar Arthrodesis Arthroscopy;  Surgeon: [name], MD;  Location: [facility];  Service: Orthopedics;  Laterality: Left;  . ANKLE FUSION Left 11/2012   Dr Sharol Given  . ANKLE SURGERY  2003   Left  . BRAIN SURGERY  2009   Biopsy  . FRACTURE SURGERY  2003   Left ankle  . HARDWARE REMOVAL Left 12/08/2012   Procedure: HARDWARE REMOVAL;  Surgeon: [name], MD;  Location: [facility];  Service: Orthopedics;  Laterality: Left;  Removal Deep Hardware, Take Down Non-Union, Revision Internal Fixation Left Ankle  . I&D EXTREMITY Left 01/01/2013   Procedure: IRRIGATION AND DEBRIDEMENT EXTREMITY;  Surgeon: [name], MD;  Location: [facility];  Service: Orthopedics;  Laterality: Left;  Irrigation, Debridement and Placement Antibiotic Beads Left Ankle  . INNER EAR SURGERY    . OPEN REDUCTION INTERNAL FIXATION (ORIF) SCAPHOID WITH DISTAL RADIUS GRAFT Right 02/01/2013   Procedure: OPEN REDUCTION INTERNAL FIXATION (ORIF) RIGHT SCAPHOID FRACTURE WITH DISTAL RADIUS GRAFT;  Surgeon: Schuyler Amor, MD;  Location: [facility];  Service: Orthopedics;  Laterality: Right;  . ORIF ANKLE  FRACTURE Left 12/08/2012   Procedure: OPEN REDUCTION INTERNAL FIXATION (ORIF) ANKLE FRACTURE;  Surgeon: [name], MD;  Location: [facility];  Service: Orthopedics;  Laterality: Left;  Removal Deep Hardware, Take Down Non-Union, Revision Internal Fixation Left Ankle  . ORIF ANKLE FRACTURE Right 09/03/2013   Procedure: OPEN REDUCTION INTERNAL FIXATION (ORIF) ANKLE FRACTURE;  Surgeon: [name], MD;  Location: [facility];  Service: Orthopedics;  Laterality: Right;  Open Reduction Internal Fixation Right Fibula  . SCAPHOID FRACTURE SURGERY  02/04/2013  . SHOULDER ARTHROSCOPY  2008   Left  . TYMPANOSTOMY TUBE PLACEMENT  1993   Social History   Occupational History  .  Not Employed    Disabled   Social History Main Topics  . Smoking status: Current Every Day Smoker    Packs/day: 0.25    Years: 30.00    Types: Cigarettes  . Smokeless tobacco: Never Used  . Alcohol use No     Comment: quit Oct. 2017(heavy drinker) .In [place]  . Drug use: No  . Sexual activity: Not Currently

## 2017-06-27 ENCOUNTER — Ambulatory Visit (INDEPENDENT_AMBULATORY_CARE_PROVIDER_SITE_OTHER): Payer: Medicare Other | Admitting: Vascular Surgery

## 2017-06-27 ENCOUNTER — Encounter: Payer: Self-pay | Admitting: Vascular Surgery

## 2017-06-27 VITALS — BP 135/88 | HR 77 | Temp 97.0°F | Resp 18 | Ht 72.0 in | Wt 229.0 lb

## 2017-06-27 DIAGNOSIS — I739 Peripheral vascular disease, unspecified: Secondary | ICD-10-CM | POA: Diagnosis not present

## 2017-06-27 NOTE — Progress Notes (Signed)
Patient ID: Harold Bailey, male   DOB: March 02, 1966, 50 y.o.   MRN: [id number]  Reason for Consult: Follow-up (to discuss surgery)   Referred by Everardo Beals, NP  Subjective:     HPI:  Harold Bailey is a 51 y.o. male with history of multiple surgeries to his left ankle from fractures was recently seen in the hospital for inability to find signals in his left foot and at that time his left ABI was 0. He was not in significant pain at the time but was in the ICU for a various drawn MRSA bronchitis and was trached. He is now out of the hospital his trach is been removed and he is walking. He has no wounds on his feet all day does have an area of an ingrown toenail on the right side. He has chronic pain specifically his left foot and also has numbness feels like his walking on graft with both feet. He does not have any rest pain that sounds vascular nor does he have claudication type symptoms at this time. He is presenting today to discuss surgery.  Past Medical History:  Diagnosis Date  . Anxiety   . Arthritis    Left ankle  . COPD (chronic obstructive pulmonary disease) ([place])   . Drug abuse ([place])   . Gait disturbance   . GERD (gastroesophageal reflux disease)   . 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  Marland Kitchen Hyperlipidemia   . Hypertension   . 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 Memory loss   . Peripheral vision loss    Left due to brain surgery  . Pneumonia   . Polycythemia   . Prediabetes   . Seizures ([place]) 11/19/2013   none since 2016  . Shortness of breath    Hx of smoking.   Family History  Problem Relation Age of Onset  . Hypertension Mother   . Bronchitis Mother   . Cancer Maternal Aunt   . Cancer Maternal Grandmother   . Stroke Neg Hx   . Diabetes Neg Hx    Past Surgical History:  Procedure Laterality Date  . ABDOMINAL AORTOGRAM W/LOWER EXTREMITY N/A 05/30/2017   Procedure:  ABDOMINAL AORTOGRAM W/LOWER EXTREMITY;  Surgeon: Elam Dutch, MD;  Location: [facility];  Service: Cardiovascular;  Laterality: N/A;  . ANKLE ARTHROSCOPY  01/17/2012   Procedure: ANKLE ARTHROSCOPY;  Surgeon: [name], MD;  Location: [facility];  Service: Orthopedics;  Laterality: Left;  . ANKLE ARTHROSCOPY WITH FUSION Left 10/02/2016   Procedure: Left Posterior Subtalar Arthrodesis Arthroscopy;  Surgeon: [name], MD;  Location: [facility];  Service: Orthopedics;  Laterality: Left;  . ANKLE FUSION Left 11/2012   Dr Sharol Given  . ANKLE SURGERY  2003   Left  . BRAIN SURGERY  2009   Biopsy  . FRACTURE SURGERY  2003   Left ankle  . HARDWARE REMOVAL Left 12/08/2012   Procedure: HARDWARE REMOVAL;  Surgeon: [name], MD;  Location: [facility];  Service: Orthopedics;  Laterality: Left;  Removal Deep Hardware, Take Down Non-Union, Revision Internal Fixation Left Ankle  . I&D EXTREMITY Left 01/01/2013   Procedure: IRRIGATION AND DEBRIDEMENT EXTREMITY;  Surgeon: [name], MD;  Location: [facility];  Service: Orthopedics;  Laterality: Left;  Irrigation, Debridement and Placement Antibiotic Beads Left Ankle  . INNER EAR SURGERY    . OPEN REDUCTION INTERNAL FIXATION (  ORIF) SCAPHOID WITH DISTAL RADIUS GRAFT Right 02/01/2013   Procedure: OPEN REDUCTION INTERNAL FIXATION (ORIF) RIGHT SCAPHOID FRACTURE WITH DISTAL RADIUS GRAFT;  Surgeon: Schuyler Amor, MD;  Location: [facility];  Service: Orthopedics;  Laterality: Right;  . ORIF ANKLE FRACTURE Left 12/08/2012   Procedure: OPEN REDUCTION INTERNAL FIXATION (ORIF) ANKLE FRACTURE;  Surgeon: [name], MD;  Location: [facility];  Service: Orthopedics;  Laterality: Left;  Removal Deep Hardware, Take Down Non-Union, Revision Internal Fixation Left Ankle  . ORIF ANKLE FRACTURE Right 09/03/2013   Procedure: OPEN REDUCTION INTERNAL FIXATION (ORIF) ANKLE FRACTURE;  Surgeon: [name], MD;  Location: [facility];  Service: Orthopedics;  Laterality: Right;  Open Reduction  Internal Fixation Right Fibula  . SCAPHOID FRACTURE SURGERY  02/04/2013  . SHOULDER ARTHROSCOPY  2008   Left  . TYMPANOSTOMY TUBE PLACEMENT  1993    Short Social History:  Social History  Substance Use Topics  . Smoking status: Current Every Day Smoker    Packs/day: 0.25    Years: 30.00    Types: Cigarettes  . Smokeless tobacco: Never Used  . Alcohol use No     Comment: quit Oct. 2017(heavy drinker) .In AA    Allergies  Allergen Reactions  . Hydrocodone Rash  . No Known Allergies   . Other     No narcotics     Current Outpatient Prescriptions  Medication Sig Dispense Refill  . acetaminophen (TYLENOL) 325 MG tablet Place 2 tablets (650 mg total) into feeding tube every 6 (six) hours as needed for mild pain (or Fever >/= 101).    Marland Kitchen albuterol (PROVENTIL) (2.5 MG/3ML) 0.083% nebulizer solution Take 3 mLs (2.5 mg total) by nebulization every 6 (six) hours. 75 mL 12  . Amino Acids-Protein Hydrolys (FEEDING SUPPLEMENT, PRO-STAT SUGAR FREE 64,) LIQD Take 60 mLs by mouth 4 (four) times daily. 900 mL 0  . arformoterol (BROVANA) 15 MCG/2ML NEBU Take 2 mLs (15 mcg total) by nebulization 2 (two) times daily. 120 mL   . aspirin 81 MG chewable tablet Place 1 tablet (81 mg total) into feeding tube daily.    . budesonide (PULMICORT) 0.5 MG/2ML nebulizer solution Take 2 mLs (0.5 mg total) by nebulization 2 (two) times daily.  12  . Chlorhexidine Gluconate Cloth 2 % PADS Apply 6 each topically daily.    . chlorhexidine gluconate, MEDLINE KIT, (PERIDEX) 0.12 % solution 15 mLs by Mouth Rinse route 2 (two) times daily. 120 mL 0  . fentaNYL (DURAGESIC - DOSED MCG/HR) 75 MCG/HR Place 1 patch (75 mcg total) onto the skin every 3 (three) days. 5 patch 0  . fentaNYL (SUBLIMAZE) 100 MCG/2ML injection Inject 0.5-2 mLs (25-100 mcg total) into the vein every 2 (two) hours as needed for severe pain. 2 mL 0  . gabapentin (NEURONTIN) 250 MG/5ML solution Place 6 mLs (300 mg total) into feeding tube every 8  (eight) hours.  12  . guaiFENesin (ROBITUSSIN) 100 MG/5ML SOLN Place 5 mLs (100 mg total) into feeding tube every 6 (six) hours. 1200 mL 0  . heparin 100-0.45 UNIT/ML-% infusion Inject 1,750 Units/hr into the vein continuous. 250 mL   . insulin aspart (NOVOLOG) 100 UNIT/ML injection Inject 0-15 Units into the skin every 4 (four) hours. 10 mL 11  . lacosamide 100 MG TABS Place 1 tablet (100 mg total) into feeding tube 2 (two) times daily. 60 tablet   . Nutritional Supplements (FEEDING SUPPLEMENT, VITAL HIGH PROTEIN,) LIQD liquid Place 1,000 mLs into feeding tube continuous.    Marland Kitchen  oxyCODONE (OXY IR/ROXICODONE) 5 MG immediate release tablet TK 1 T PO Q 6 H PRN P  0  . predniSONE (DELTASONE) 20 MG tablet Place 2 tablets (40 mg total) into feeding tube daily with breakfast.    . sodium chloride flush (NS) 0.9 % SOLN 10-40 mLs by Intracatheter route every 12 (twelve) hours.    . traMADol (ULTRAM) 50 MG tablet TK 1 T PO Q 6 H FOR 5 DAYS  0  . valproate 500 mg in dextrose 5 % 50 mL Inject 500 mg into the vein 2 (two) times daily.    . vancomycin (VANCOCIN) 50 mg/mL oral solution Place 2.5 mLs (125 mg total) into feeding tube every 6 (six) hours.    . Water For Irrigation, Sterile (FREE WATER) SOLN Place 100 mLs into feeding tube every 8 (eight) hours.     No current facility-administered medications for this visit.     Review of Systems  Constitutional:  Constitutional negative. HENT: HENT negative.  Eyes: Eyes negative.  Respiratory: Positive for cough and shortness of breath.  Cardiovascular: Positive for chest pain, chest tightness, dyspnea with exertion and leg swelling.  GI: Gastrointestinal negative.  Musculoskeletal: Positive for leg pain and joint pain.  Neurological: Positive for numbness.  Hematologic: Hematologic/lymphatic negative.  Psychiatric: Psychiatric negative.        Objective:  Objective   Vitals:   06/27/17 0929  BP: 135/88  Pulse: 77  Resp: 18  Temp: (!) 97 F  (36.1 C)  SpO2: 93%  Weight: 229 lb (103.9 kg)  Height: 6' (1.829 m)   Body mass index is 31.06 kg/m.  Physical Exam  Constitutional: He is oriented to person, place, and time. He appears well-developed.  HENT:  Head: Normocephalic.  Eyes: Pupils are equal, round, and reactive to light.  Cardiovascular: Normal rate.   Pulses:      Femoral pulses are 2+ on the right side, and 2+ on the left side. Strong bilateral PT signals  Cap refill <1s bilateral feet  Abdominal: Soft. He exhibits no mass.  Musculoskeletal: Normal range of motion. He exhibits no edema.  Neurological: He is alert and oriented to person, place, and time.  Skin: Skin is warm and dry.  Psychiatric: He has a normal mood and affect. His behavior is normal. Judgment and thought content normal.    Data: His ABIs again were reviewed demonstrating 0 on the left. He also has vein mapping with marginal vein bilaterally for bypass.  Angiogram was reviewed which demonstrates bilateral SFA occlusions and on the left he reconstitutes a below-knee popliteal as he does on the right as well dominant flow is via the posterior tibial arteries.     Assessment/Plan:     51 year old male here to discuss surgery for what was critical limb ischemia while in the hospital when admitted with severe bronchitis requiring tracheostomy. He does have leg pain specifically on the left. Multiple traumatic injuries and surgeries on that side and has apparent neuropathy bilaterally as well. 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 From this standpoint given the fact that he has no rest pain or wounds at this time I think we should hold off on surgery. We will get repeat ABIs in 6 months. He does have an area of an ingrown toenail on the right lateral him that should this not heal we may have to proceed soon with intervention which  could potentially be stenting given his recent bronchitis issues. We will otherwise see him in 6 months.     Waynetta Sandy MD [facility]

## 2017-06-27 NOTE — Addendum Note (Signed)
Addended by: Lianne Cure A on: 06/27/2017 10:50 AM   Modules accepted: Orders

## 2017-07-02 ENCOUNTER — Other Ambulatory Visit (INDEPENDENT_AMBULATORY_CARE_PROVIDER_SITE_OTHER): Payer: Self-pay | Admitting: Orthopedic Surgery

## 2017-07-02 ENCOUNTER — Telehealth (INDEPENDENT_AMBULATORY_CARE_PROVIDER_SITE_OTHER): Payer: Self-pay

## 2017-07-02 MED ORDER — MELOXICAM 7.5 MG PO TABS: 7.5000 mg | ORAL_TABLET | Freq: Every day | ORAL | 3 refills | Status: DC

## 2017-07-02 NOTE — Telephone Encounter (Signed)
I sent prescription to the pharmacy. 

## 2017-07-02 NOTE — Telephone Encounter (Signed)
Patient requesting rx for meloxicam. I called and spoke with his wife. Using [facility] in [place]. Can you please advise about rx request.

## 2017-07-02 NOTE — Telephone Encounter (Signed)
Patient has a question concerning 2 of his medications.  Cb# is [phone].  Please advise.  Thank You.

## 2017-07-04 ENCOUNTER — Ambulatory Visit (INDEPENDENT_AMBULATORY_CARE_PROVIDER_SITE_OTHER): Payer: Medicare Other | Admitting: Family

## 2017-07-28 ENCOUNTER — Ambulatory Visit: Payer: Medicare Other | Admitting: Neurology

## 2017-07-30 ENCOUNTER — Encounter: Payer: Self-pay | Admitting: Neurology

## 2017-07-30 ENCOUNTER — Ambulatory Visit (INDEPENDENT_AMBULATORY_CARE_PROVIDER_SITE_OTHER): Payer: Medicare Other | Admitting: Neurology

## 2017-07-30 VITALS — BP 118/78 | HR 94 | Ht 72.0 in | Wt 240.2 lb

## 2017-07-30 DIAGNOSIS — G0490 Encephalitis and encephalomyelitis, unspecified: Secondary | ICD-10-CM

## 2017-07-30 DIAGNOSIS — R4189 Other symptoms and signs involving cognitive functions and awareness: Secondary | ICD-10-CM | POA: Diagnosis not present

## 2017-07-30 DIAGNOSIS — G40209 Localization-related (focal) (partial) symptomatic epilepsy and epileptic syndromes with complex partial seizures, not intractable, without status epilepticus: Secondary | ICD-10-CM

## 2017-07-30 NOTE — Patient Instructions (Addendum)
1.  Continue Depakote ER 1000mg  daily and lacosamide 100mg  twice daily 2.  Start exercise, mental stimulation and social activities 3.  Recommend Mediterranean diet (see below) to help prevent dementia 4.  Follow up in 6 months   Mediterranean Diet A Mediterranean diet refers to food and lifestyle choices that are based on the traditions of countries located on the [employer]. This way of eating has been shown to help prevent certain conditions and improve outcomes for people who have chronic diseases, like kidney disease and heart disease. What are tips for following this plan? Lifestyle  Cook and eat meals together with your family, when possible.  Drink enough fluid to keep your urine clear or pale yellow.  Be physically active every day. This includes: ? Aerobic exercise like running or swimming. ? Leisure activities like gardening, walking, or housework.  Get 7-8 hours of sleep each night.  If recommended by your health care provider, drink red wine in moderation. This means 1 glass a day for nonpregnant women and 2 glasses a day for men. A glass of wine equals 5 oz (150 mL). Reading food labels  Check the serving size of packaged foods. For foods such as rice and pasta, the serving size refers to the amount of cooked product, not dry.  Check the total fat in packaged foods. Avoid foods that have saturated fat or trans fats.  Check the ingredients list for added sugars, such as corn syrup. Shopping  At the grocery store, buy most of your food from the areas near the walls of the store. This includes: ? Fresh fruits and vegetables (produce). ? Grains, beans, nuts, and seeds. Some of these may be available in unpackaged forms or large amounts (in bulk). ? Fresh seafood. ? Poultry and eggs. ? Low-fat dairy products.  Buy whole ingredients instead of prepackaged foods.  Buy fresh fruits and vegetables in-season from local farmers markets.  Buy frozen fruits and  vegetables in resealable bags.  If you do not have access to quality fresh seafood, buy precooked frozen shrimp or canned fish, such as tuna, salmon, or sardines.  Buy small amounts of raw or cooked vegetables, salads, or olives from the deli or salad bar at your store.  Stock your pantry so you always have certain foods on hand, such as olive oil, canned tuna, canned tomatoes, rice, pasta, and beans. Cooking  Cook foods with extra-virgin olive oil instead of using butter or other vegetable oils.  Have meat as a side dish, and have vegetables or grains as your main dish. This means having meat in small portions or adding small amounts of meat to foods like pasta or stew.  Use beans or vegetables instead of meat in common dishes like chili or lasagna.  Experiment with different cooking methods. Try roasting or broiling vegetables instead of steaming or sauteing them.  Add frozen vegetables to soups, stews, pasta, or rice.  Add nuts or seeds for added healthy fat at each meal. You can add these to yogurt, salads, or vegetable dishes.  Marinate fish or vegetables using olive oil, lemon juice, garlic, and fresh herbs. Meal planning  Plan to eat 1 vegetarian meal one day each week. Try to work up to 2 vegetarian meals, if possible.  Eat seafood 2 or more times a week.  Have healthy snacks readily available, such as: ? Vegetable sticks with hummus. ? [place] yogurt. ? Fruit and nut trail mix.  Eat balanced meals throughout the week. This includes: ?  Fruit: 2-3 servings a day ? Vegetables: 4-5 servings a day ? Low-fat dairy: 2 servings a day ? Fish, poultry, or lean meat: 1 serving a day ? Beans and legumes: 2 or more servings a week ? Nuts and seeds: 1-2 servings a day ? Whole grains: 6-8 servings a day ? Extra-virgin olive oil: 3-4 servings a day  Limit red meat and sweets to only a few servings a month What are my food choices?  Mediterranean diet ? Recommended ? Grains:  Whole-grain pasta. Brown rice. Bulgar wheat. Polenta. Couscous. Whole-wheat bread. [place]. ? Vegetables: Artichokes. Beets. Broccoli. Cabbage. Carrots. Eggplant. Green beans. Chard. Kale. Spinach. Onions. Leeks. Peas. Squash. Tomatoes. Peppers. Radishes. ? Fruits: Apples. Apricots. Avocado. Berries. Bananas. Cherries. Dates. Figs. Grapes. Lemons. Melon. Oranges. Peaches. Plums. Pomegranate. ? Meats and other protein foods: Beans. Almonds. Sunflower seeds. Pine nuts. Peanuts. [place]. Salmon. Scallops. Shrimp. Bettles. Tilapia. Clams. Oysters. Eggs. ? Dairy: Low-fat milk. Cheese. Greek yogurt. ? Beverages: Water. Red wine. Herbal tea. ? Fats and oils: Extra virgin olive oil. Avocado oil. Grape seed oil. ? Sweets and desserts: [place] yogurt with honey. Baked apples. Poached pears. Trail mix. ? Seasoning and other foods: Basil. Cilantro. Coriander. Cumin. Mint. Parsley. Sage. Rosemary. Tarragon. Garlic. Oregano. Thyme. Pepper. Balsalmic vinegar. Tahini. Hummus. Tomato sauce. Olives. Mushrooms. ? Limit these ? Grains: Prepackaged pasta or rice dishes. Prepackaged cereal with added sugar. ? Vegetables: Deep fried potatoes (french fries). ? Fruits: Fruit canned in syrup. ? Meats and other protein foods: Beef. Pork. Lamb. Poultry with skin. Hot dogs. Berniece Salines. ? Dairy: Ice cream. Sour cream. Whole milk. ? Beverages: Juice. Sugar-sweetened soft drinks. Beer. Liquor and spirits. ? Fats and oils: Butter. Canola oil. Vegetable oil. Beef fat (tallow). Lard. ? Sweets and desserts: Cookies. Cakes. Pies. Candy. ? Seasoning and other foods: Mayonnaise. Premade sauces and marinades. ? The items listed may not be a complete list. Talk with your dietitian about what dietary choices are right for you. Summary  The Mediterranean diet includes both food and lifestyle choices.  Eat a variety of fresh fruits and vegetables, beans, nuts, seeds, and whole grains.  Limit the amount of red meat and sweets that you  eat.  Talk with your health care provider about whether it is safe for you to drink red wine in moderation. This means 1 glass a day for nonpregnant women and 2 glasses a day for men. A glass of wine equals 5 oz (150 mL). This information is not intended to replace advice given to you by your health care provider. Make sure you discuss any questions you have with your health care provider. Document Released: 05/02/2016 Document Revised: 06/04/2016 Document Reviewed: 05/02/2016 Elsevier Interactive Patient Education  [employer].
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
## 2017-08-08 ENCOUNTER — Other Ambulatory Visit: Payer: Self-pay

## 2017-08-08 ENCOUNTER — Other Ambulatory Visit: Payer: Self-pay | Admitting: Neurology

## 2017-08-08 ENCOUNTER — Telehealth: Payer: Self-pay | Admitting: Neurology

## 2017-08-08 DIAGNOSIS — R569 Unspecified convulsions: Secondary | ICD-10-CM

## 2017-08-08 NOTE — Telephone Encounter (Signed)
He will need enough for 120 days

## 2017-08-08 NOTE — Telephone Encounter (Signed)
Patient called and is needing to see if he can get a refill on his medication? He will be going into a treatment center and can only bring in the medication he has with him. He said he uses [facility]. Please Call. Thanks

## 2017-08-08 NOTE — Telephone Encounter (Signed)
Called [facility] [place], spoke with [place], gave refills for Depakote ER 500mg  2QD, lacosamide 100mg  BID. Advsd her he can have #240 OR 60 with 4refills, whichever works better for the Pt.

## 2017-08-08 NOTE — Telephone Encounter (Signed)
Pt called and said as long as the prescription says refills it will be ok for the facility he is going to

## 2017-08-11 ENCOUNTER — Ambulatory Visit (INDEPENDENT_AMBULATORY_CARE_PROVIDER_SITE_OTHER): Payer: Medicare Other

## 2017-08-11 ENCOUNTER — Ambulatory Visit (INDEPENDENT_AMBULATORY_CARE_PROVIDER_SITE_OTHER): Payer: Medicare Other | Admitting: Orthopedic Surgery

## 2017-08-11 ENCOUNTER — Encounter (INDEPENDENT_AMBULATORY_CARE_PROVIDER_SITE_OTHER): Payer: Self-pay | Admitting: Orthopedic Surgery

## 2017-08-11 DIAGNOSIS — M722 Plantar fascial fibromatosis: Secondary | ICD-10-CM | POA: Diagnosis not present

## 2017-08-11 DIAGNOSIS — M25572 Pain in left ankle and joints of left foot: Secondary | ICD-10-CM

## 2017-08-11 DIAGNOSIS — M25571 Pain in right ankle and joints of right foot: Secondary | ICD-10-CM

## 2017-08-11 DIAGNOSIS — G8929 Other chronic pain: Secondary | ICD-10-CM

## 2017-08-11 DIAGNOSIS — M12571 Traumatic arthropathy, right ankle and foot: Secondary | ICD-10-CM

## 2017-08-11 MED ORDER — METHYLPREDNISOLONE ACETATE 40 MG/ML IJ SUSP
40.0000 mg | INTRAMUSCULAR | Status: AC | PRN
  Administered 2017-08-11: 40 mg

## 2017-08-11 MED ORDER — LIDOCAINE HCL 1 % IJ SOLN
2.0000 mL | INTRAMUSCULAR | Status: AC | PRN
  Administered 2017-08-11: 2 mL

## 2017-08-11 NOTE — Progress Notes (Signed)
Office Visit Note   Patient: Harold Bailey           Date of Birth: 1965-10-10           MRN: [id number] Visit Date: 08/11/2017              Requested by: Everardo Beals, NP [facility] [address] [place],  [address] PCP: Everardo Beals, NP  No chief complaint on file.     HPI: Patient is a 51 year old gentleman who presents complaining of traumatic arthritis of the right ankle.  He states that his pain over the ankle and subtalar joint of the left have resolved but he has been having episodes of pain across the midfoot that radiates proximally.  Patient also complains of occasional periodic pain in the anterior lateral joint line on the right ankle.  Assessment & Plan: Visit Diagnoses:  1. Chronic pain of both ankles   2. Plantar fasciitis of left foot   3. Traumatic arthritis of ankle, right     Plan: Patient is starting to develop arthritic pain through the midfoot on the left.  With the patellar subtalar fusion and will need to better protect the midfoot.  Discussed that a fusion for the midfoot is not an option.  Will have patient get a pair of extra-depth shoes custom orthotics a heel lift on the left with double upright braces to protect the midfoot on the left.  Patient received an injection for the plantar fasciitis on the left.  Patient will follow-up as needed.  Follow-Up Instructions: Return if symptoms worsen or fail to improve.   Ortho Exam  Patient is alert, oriented, no adenopathy, well-dressed, normal affect, normal respiratory effort. Examination patient has an antalgic gait he uses a cane he walks with his left foot externally rotated.  He has no pain with attempted motion across the ankle or subtalar joint his fusion is stable.  He has a good pulse he has no pain with passive range of motion through the midfoot joint and has good inversion eversion dorsiflexion and plantarflexion.  Patient is point tender to palpation  over the origin of the plantar fascia left foot there is no redness no cellulitis no ulcers no signs of infection.  Examination of the right ankle he is tender to palpation over the anterior lateral joint line.  There is crepitation with range of motion.  He has good subtalar motion he has a good pulse.  Imaging: Xr Ankle Complete Left  Result Date: 08/11/2017 2 view radiographs of the left ankle shows stable tibiotalar and subtalar fusion.  No complicating features no evidence of acute fracture.  The midfoot joints are well aligned in the lateral view.  Xr Ankle Complete Right  Result Date: 08/11/2017 3 view radiographs of the right ankle shows traumatic arthritis over the lateral joint line with subcondylar cysts and sclerosis with valgus collapse of the ankle.  The fibula was out to length with no shortening.  Lateral radiograph shows a small posterior malleolar fracture fibrous union.  No images are attached to the encounter.  Labs: Lab Results  Component Value Date   HGBA1C 6.3 (H) 06/10/2017   HGBA1C 6.3 (H) 05/28/2017   HGBA1C 5.9 (H) 11/22/2013   REPTSTATUS 05/21/2017 FINAL 05/16/2017   REPTSTATUS 05/21/2017 FINAL 05/16/2017   GRAMSTAIN  05/11/2017    ABUNDANT WBC PRESENT, PREDOMINANTLY PMN FEW GRAM NEGATIVE RODS    CULT NO GROWTH 5 DAYS 05/16/2017   CULT NO GROWTH 5  DAYS 05/16/2017   LABORGA METHICILLIN RESISTANT STAPHYLOCOCCUS AUREUS 05/11/2017    Orders:  Orders Placed This Encounter  Procedures  . XR Ankle Complete Left  . XR Ankle Complete Right   No orders of the defined types were placed in this encounter.    Procedures: Foot Inj Date/Time: 08/11/2017 2:24 PM Performed by: [name], MD Authorized by: [name], MD   Consent Given by:  Patient Site marked: the procedure site was marked   Timeout: prior to procedure the correct patient, procedure, and site was verified   Indications:  Fasciitis and pain Condition: Plantar Fasciitis     Location: left plantar fascia muscle   Prep: patient was prepped and draped in usual sterile fashion   Needle Size:  22 G Approach:  Dorsal Medications:  2 mL lidocaine 1 %; 40 mg methylPREDNISolone acetate 40 MG/ML Patient Tolerance:  Patient tolerated the procedure well with no immediate complications    Clinical Data: No additional findings.  ROS:  All other systems negative, except as noted in the HPI. Review of Systems  Objective: Vital Signs: There were no vitals taken for this visit.  Specialty Comments:  No specialty comments available.  PMFS History: Patient Active Problem List   Diagnosis Date Noted  . Difficult intravenous access   . Acute on chronic respiratory failure with hypoxia and hypercapnia (HCC)   . Essential hypertension 05/11/2017  . Pneumonia 05/11/2017  . SOB (shortness of breath)   . Plantar fasciitis of left foot 01/14/2017  . Traumatic arthritis of ankle, right 01/07/2017  . H/O ankle fusion 11/14/2016  . 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 At risk for adverse drug event 10/08/2016  . History of snoring 10/08/2016  . Subtalar joint instability, left   . 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 History of alcohol abuse 07/02/2016  . COPD exacerbation ([place]) 07/02/2016  . Tobacco dependence 06/06/2015  . Medication overuse headache 02/28/2015  . Localization-related symptomatic epilepsy and epileptic syndromes with complex partial seizures, not intractable, without status epilepticus ([place]) 02/28/2015  . Cognitive impairment 02/28/2015  . 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 Tobacco abuse 02/28/2015  . Brain tumor ([place]) 11/19/2013  . Seizures ([place]) 11/19/2013  . Memory loss   . HA (headache)   . Gait disturbance   . Pain in left ankle and joints of left foot 10/15/2013  . Falls frequently 10/15/2013  . Swelling 09/09/2013  . Rash and nonspecific skin eruption 09/09/2013  . Ankle fracture, lateral malleolus, closed  09/03/2013  . Unspecified vitamin D deficiency 08/12/2013  . Polycythemia   . Memory deficit   . History of drug abuse   . GERD (gastroesophageal reflux disease)   . Anxiety   . Arthritis   . Hyperlipidemia   . Prediabetes    Past Medical History:  Diagnosis Date  . Anxiety   . Arthritis    Left ankle  . COPD (chronic obstructive pulmonary disease) ([place])   . Drug abuse ([place])   . Gait disturbance   . GERD (gastroesophageal reflux disease)   . 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  Marland Kitchen Hyperlipidemia   . Hypertension   . 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 Memory loss   . Peripheral vision loss    Left due to brain surgery  . Pneumonia   . Polycythemia   . Prediabetes   . Seizures ([place]) 11/19/2013  none since 2016  . Shortness of breath    Hx of smoking.    Family History  Problem Relation Age of Onset  . Hypertension Mother   . Bronchitis Mother   . Cancer Maternal Aunt   . Cancer Maternal Grandmother   . Stroke Neg Hx   . Diabetes Neg Hx     Past Surgical History:  Procedure Laterality Date  . ABDOMINAL AORTOGRAM W/LOWER EXTREMITY N/A 05/30/2017   Performed by Elam Dutch, MD at [facility]  . ANKLE ARTHROSCOPY Left 01/17/2012   Performed by [name], MD at [place] [facility]  . ANKLE FUSION Left 11/2012   Dr Sharol Given  . ANKLE FUSION Left 01/17/2012   Performed by [name], MD at [facility]  . ANKLE SURGERY  2003   Left  . BRAIN SURGERY  2009   Biopsy  . FRACTURE SURGERY  2003   Left ankle  . HARDWARE REMOVAL Left 12/08/2012   Performed by [name], MD at   . INNER EAR SURGERY    . IRRIGATION AND DEBRIDEMENT EXTREMITY Left 01/01/2013   Performed by [name], MD at [facility]  . Left Posterior Subtalar Arthrodesis Arthroscopy Left 10/02/2016   Performed by [name], MD at [place]  . OPEN REDUCTION INTERNAL FIXATION (ORIF) ANKLE FRACTURE Right 09/03/2013    Performed by [name], MD at [place]  . OPEN REDUCTION INTERNAL FIXATION (ORIF) ANKLE FRACTURE Left 12/08/2012   Performed by [name], MD at [place]  . OPEN REDUCTION INTERNAL FIXATION (ORIF) RIGHT SCAPHOID FRACTURE WITH DISTAL RADIUS GRAFT Right 02/01/2013   Performed by Schuyler Amor, MD at [place]  02/04/2013  . SHOULDER ARTHROSCOPY  2008   Left  . TYMPANOSTOMY TUBE PLACEMENT  1993   Social History   Occupational History    Employer: NOT EMPLOYED    Comment: Disabled  Tobacco Use  . Smoking status: Current Every Day Smoker    Packs/day: 0.25    Years: 30.00    Pack years: 7.50    Types: Cigarettes  . Smokeless tobacco: Never Used  Substance and Sexual Activity  . Alcohol use: No    Alcohol/week: 0.0 oz    Comment: quit Oct. 2017(heavy drinker) .In [place]  . Drug use: No  . Sexual activity: Not Currently

## 2017-08-18 ENCOUNTER — Telehealth: Payer: Self-pay | Admitting: Neurology

## 2017-08-18 NOTE — Telephone Encounter (Signed)
LM on wifes VM for rtrn call

## 2017-08-18 NOTE — Telephone Encounter (Signed)
Pt left a message that he was returning a phone call

## 2017-08-18 NOTE — Telephone Encounter (Signed)
The medication in question is the lacosamide.   1.  Continue Depakote ER 1000mg  daily and lacosamide 100mg  twice daily for now. 2.  Start oxcarbazepine 150mg  twice daily for one week, then increase to 300mg  twice daily. 3.  When he reaches oxcarbazepine 300mg  twice daily, decrease lacosamide to 50mg  twice daily for 1 week, then stop.

## 2017-08-18 NOTE — Telephone Encounter (Signed)
Patient's wife called and said that Harold Bailey is trying to get into a treatment center and the Seizure medication he is on is consider a Narcotic. They are wondering is there anything else he could be put on? Please Call wife's cell at [phone]. Thanks

## 2017-08-18 NOTE — Telephone Encounter (Signed)
Dr Farrel Demark advise

## 2017-08-19 MED ORDER — OXCARBAZEPINE 150 MG PO TABS: 150.0000 mg | ORAL_TABLET | Freq: Two times a day (BID) | ORAL | 3 refills | Status: DC

## 2017-08-19 NOTE — Telephone Encounter (Signed)
Spoke with Pts wife, Jamerion. Advsd of new medication oxcarbazine and how to titrate up, and how to titrate down and d/c the lacosamide.

## 2017-08-20 ENCOUNTER — Telehealth: Payer: Self-pay | Admitting: Neurology

## 2017-08-20 NOTE — Telephone Encounter (Signed)
Called and spoke with Pt, went over oxcarbazine and locasamide instructions as well as continuing the depakote. I advsd Pt that when I spoke with his wife yesterday, she had written the instructions down. Pt will call back if any further questions

## 2017-08-20 NOTE — Telephone Encounter (Signed)
Patient needs to talk to someone about going to treatment but will need to talk to someone about medication

## 2017-08-22 ENCOUNTER — Other Ambulatory Visit (INDEPENDENT_AMBULATORY_CARE_PROVIDER_SITE_OTHER): Payer: Self-pay | Admitting: Family

## 2017-08-25 ENCOUNTER — Telehealth (INDEPENDENT_AMBULATORY_CARE_PROVIDER_SITE_OTHER): Payer: Self-pay | Admitting: Orthopedic Surgery

## 2017-08-25 NOTE — Telephone Encounter (Signed)
08/11/2017 ov note faxed to [place]

## 2017-08-28 ENCOUNTER — Ambulatory Visit: Payer: Medicare Other | Admitting: Neurology

## 2017-09-09 ENCOUNTER — Telehealth: Payer: Self-pay | Admitting: Neurology

## 2017-09-09 NOTE — Telephone Encounter (Signed)
Patient called needing his prescriptions to say 90 Day supply. He will be going into a facility on 09/26/2017 and will not be able to have them refilled. Please Advise. Thanks

## 2017-09-22 ENCOUNTER — Other Ambulatory Visit: Payer: Self-pay

## 2017-09-22 MED ORDER — OXCARBAZEPINE 300 MG PO TABS
300.0000 mg | ORAL_TABLET | Freq: Two times a day (BID) | ORAL | 1 refills | Status: DC
Start: 2017-09-22 — End: 2018-03-12

## 2017-09-22 MED ORDER — DIVALPROEX SODIUM ER 500 MG PO TB24: 1000.0000 mg | ORAL_TABLET | Freq: Every day | ORAL | 3 refills | Status: DC

## 2017-09-24 ENCOUNTER — Other Ambulatory Visit (INDEPENDENT_AMBULATORY_CARE_PROVIDER_SITE_OTHER): Payer: Self-pay | Admitting: Family

## 2017-09-24 MED ORDER — IBUPROFEN 800 MG PO TABS: 800.0000 mg | ORAL_TABLET | Freq: Three times a day (TID) | ORAL | 0 refills | Status: DC | PRN

## 2017-09-30 ENCOUNTER — Telehealth: Payer: Self-pay | Admitting: Neurology

## 2017-09-30 NOTE — Telephone Encounter (Signed)
Called Pt, LM on VM, advising he will need to get such a letter from whomever Rx'd the O2

## 2017-09-30 NOTE — Telephone Encounter (Signed)
Patient called stating that he had been in the Hospital for about 2 months with Pneumonia and is now having to be on Oxygen. He is needing a letter written stating that he cannot be without his Oxygen tank. Please Call. Thanks

## 2017-11-25 ENCOUNTER — Ambulatory Visit: Payer: Medicare Other | Admitting: Podiatry

## 2017-12-16 DIAGNOSIS — G4700 Insomnia, unspecified: Secondary | ICD-10-CM | POA: Insufficient documentation

## 2017-12-19 ENCOUNTER — Other Ambulatory Visit (INDEPENDENT_AMBULATORY_CARE_PROVIDER_SITE_OTHER): Payer: Self-pay | Admitting: Orthopedic Surgery

## 2017-12-21 ENCOUNTER — Other Ambulatory Visit (INDEPENDENT_AMBULATORY_CARE_PROVIDER_SITE_OTHER): Payer: Self-pay | Admitting: Family

## 2017-12-26 ENCOUNTER — Encounter (HOSPITAL_COMMUNITY): Payer: Medicare Other

## 2017-12-26 ENCOUNTER — Ambulatory Visit: Payer: Medicare Other | Admitting: Vascular Surgery

## 2018-01-01 ENCOUNTER — Ambulatory Visit: Payer: Medicare Other | Admitting: Podiatry

## 2018-01-11 ENCOUNTER — Other Ambulatory Visit (INDEPENDENT_AMBULATORY_CARE_PROVIDER_SITE_OTHER): Payer: Self-pay | Admitting: Orthopedic Surgery

## 2018-01-12 NOTE — Telephone Encounter (Signed)
Patient last seen in November 20189.  Ok to refill?

## 2018-01-15 ENCOUNTER — Encounter: Payer: Self-pay | Admitting: Internal Medicine

## 2018-01-23 ENCOUNTER — Ambulatory Visit (HOSPITAL_COMMUNITY)
Admission: RE | Admit: 2018-01-23 | Discharge: 2018-01-23 | Disposition: A | Discharge status: Home / Self Care | Payer: Medicare Other | Source: Ambulatory Visit | Attending: Vascular Surgery | Admitting: Vascular Surgery

## 2018-01-23 ENCOUNTER — Ambulatory Visit (INDEPENDENT_AMBULATORY_CARE_PROVIDER_SITE_OTHER): Payer: Medicare Other | Admitting: Vascular Surgery

## 2018-01-23 ENCOUNTER — Encounter: Payer: Self-pay | Admitting: Vascular Surgery

## 2018-01-23 VITALS — BP 135/89 | HR 110 | Resp 20 | Ht 72.0 in | Wt 237.0 lb

## 2018-01-23 DIAGNOSIS — I824Y2 Acute embolism and thrombosis of unspecified deep veins of left proximal lower extremity: Secondary | ICD-10-CM | POA: Diagnosis not present

## 2018-01-23 DIAGNOSIS — I739 Peripheral vascular disease, unspecified: Secondary | ICD-10-CM | POA: Insufficient documentation

## 2018-01-23 NOTE — Progress Notes (Signed)
Patient ID: Harold Bailey, male   DOB: 03-22-1966, 52 y.o.   MRN: [id number]  Reason for Consult: Follow-up (6 month f/u )   Referred by Everardo Beals, NP  Subjective:     HPI:  Harold Bailey is a 52 y.o. male was hospitalized last year with MR SA pneumonia and was found to have coolness of his lower extremities.  He underwent angiogram demonstrated bilateral SFA occlusions.  He also had an upper extremity DVT as well as a lower extremity DVT at that time has been maintained on Eliquis.  He is previously taking aspirin but has not been taking it even though it is on his med list today.  He has recently had pneumonia and he continues to smoke.  He has bilateral ankle pain as well as left thigh pain but this is mostly related to previous surgeries.  He has not had vascular surgery in the past.  He is able to walk does not have any wounds on his feet feels like his feet are okay at this time.  Past Medical History:  Diagnosis Date  . Anxiety   . Arthritis    Left ankle  . COPD (chronic obstructive pulmonary disease) ([place])   . Drug abuse ([facility])   . Gait disturbance   . GERD (gastroesophageal reflux disease)   . 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  Marland Kitchen Hyperlipidemia   . Hypertension   . 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 Memory loss   . Peripheral vision loss    Left due to brain surgery  . Pneumonia   . Polycythemia   . Prediabetes   . Seizures ([place]) 11/19/2013   none since 2016  . Shortness of breath    Hx of smoking.   Family History  Problem Relation Age of Onset  . Hypertension Mother   . Bronchitis Mother   . Cancer Maternal Aunt   . Cancer Maternal Grandmother   . Stroke Neg Hx   . Diabetes Neg Hx    Past Surgical History:  Procedure Laterality Date  . ABDOMINAL AORTOGRAM W/LOWER EXTREMITY N/A 05/30/2017   Procedure: ABDOMINAL AORTOGRAM W/LOWER EXTREMITY;  Surgeon: Elam Dutch, MD;  Location: [facility];  Service: Cardiovascular;  Laterality: N/A;  . ANKLE ARTHROSCOPY  01/17/2012   Procedure: ANKLE ARTHROSCOPY;  Surgeon: [name], MD;  Location: [facility];  Service: Orthopedics;  Laterality: Left;  . ANKLE ARTHROSCOPY WITH FUSION Left 10/02/2016   Procedure: Left Posterior Subtalar Arthrodesis Arthroscopy;  Surgeon: [name], MD;  Location: [facility];  Service: Orthopedics;  Laterality: Left;  . ANKLE FUSION Left 11/2012   Dr Sharol Given  . ANKLE SURGERY  2003   Left  . BRAIN SURGERY  2009   Biopsy  . FRACTURE SURGERY  2003   Left ankle  . HARDWARE REMOVAL Left 12/08/2012   Procedure: HARDWARE REMOVAL;  Surgeon: [name], MD;  Location: [facility];  Service: Orthopedics;  Laterality: Left;  Removal Deep Hardware, Take Down Non-Union, Revision Internal Fixation Left Ankle  . I&D EXTREMITY Left 01/01/2013   Procedure: IRRIGATION AND DEBRIDEMENT EXTREMITY;  Surgeon: [name], MD;  Location: [facility];  Service: Orthopedics;  Laterality: Left;  Irrigation, Debridement and Placement Antibiotic Beads Left Ankle  . INNER EAR SURGERY    . OPEN REDUCTION INTERNAL FIXATION (ORIF) SCAPHOID WITH DISTAL RADIUS GRAFT Right 02/01/2013  Procedure: OPEN REDUCTION INTERNAL FIXATION (ORIF) RIGHT SCAPHOID FRACTURE WITH DISTAL RADIUS GRAFT;  Surgeon: Schuyler Amor, MD;  Location: [facility];  Service: Orthopedics;  Laterality: Right;  . ORIF ANKLE FRACTURE Left 12/08/2012   Procedure: OPEN REDUCTION INTERNAL FIXATION (ORIF) ANKLE FRACTURE;  Surgeon: [name], MD;  Location: [facility];  Service: Orthopedics;  Laterality: Left;  Removal Deep Hardware, Take Down Non-Union, Revision Internal Fixation Left Ankle  . ORIF ANKLE FRACTURE Right 09/03/2013   Procedure: OPEN REDUCTION INTERNAL FIXATION (ORIF) ANKLE FRACTURE;  Surgeon: [name], MD;  Location: [facility];  Service: Orthopedics;  Laterality: Right;  Open Reduction Internal Fixation Right Fibula  . SCAPHOID FRACTURE SURGERY   02/04/2013  . SHOULDER ARTHROSCOPY  2008   Left  . TYMPANOSTOMY TUBE PLACEMENT  1993    Short Social History:  Social History   Tobacco Use  . Smoking status: Current Every Day Smoker    Packs/day: 0.25    Years: 30.00    Pack years: 7.50    Types: Cigarettes  . Smokeless tobacco: Never Used  Substance Use Topics  . Alcohol use: No    Alcohol/week: 0.0 oz    Comment: quit Oct. 2017(heavy drinker) .In [employer]    Allergies  Allergen Reactions  . Hydrocodone Rash  . No Known Allergies   . Other     No narcotics     Current Outpatient Medications  Medication Sig Dispense Refill  . albuterol (PROVENTIL) (2.5 MG/3ML) 0.083% nebulizer solution Take 3 mLs (2.5 mg total) by nebulization every 6 (six) hours. 75 mL 12  . arformoterol (BROVANA) 15 MCG/2ML NEBU Take 2 mLs (15 mcg total) by nebulization 2 (two) times daily. 120 mL   . budesonide (PULMICORT) 0.5 MG/2ML nebulizer solution Take 2 mLs (0.5 mg total) by nebulization 2 (two) times daily.  12  . ibuprofen (ADVIL,MOTRIN) 800 MG tablet TAKE 1 TABLET(800 MG) BY MOUTH EVERY 8 HOURS AS NEEDED 60 tablet 0  . Oxcarbazepine (TRILEPTAL) 300 MG tablet Take 1 tablet (300 mg total) by mouth 2 (two) times daily. 180 tablet 1  . acetaminophen (TYLENOL) 325 MG tablet Place 2 tablets (650 mg total) into feeding tube every 6 (six) hours as needed for mild pain (or Fever >/= 101). (Patient not taking: Reported on 01/23/2018)    . Amino Acids-Protein Hydrolys (FEEDING SUPPLEMENT, PRO-STAT SUGAR FREE 64,) LIQD Take 60 mLs by mouth 4 (four) times daily. (Patient not taking: Reported on 07/30/2017) 900 mL 0  . aspirin 81 MG chewable tablet Place 1 tablet (81 mg total) into feeding tube daily. (Patient not taking: Reported on 07/30/2017)    . Chlorhexidine Gluconate Cloth 2 % PADS Apply 6 each topically daily. (Patient not taking: Reported on 07/30/2017)    . chlorhexidine gluconate, MEDLINE KIT, (PERIDEX) 0.12 % solution 15 mLs by Mouth Rinse route 2 (two)  times daily. (Patient not taking: Reported on 07/30/2017) 120 mL 0  . diclofenac sodium (VOLTAREN) 1 % GEL APPLY 2 TO 4 GRAMS EXTERNALLY TO THE AFFECTED AREA TWICE DAILY 500 g 0  . divalproex (DEPAKOTE ER) 500 MG 24 hr tablet Take 2 tablets (1,000 mg total) by mouth daily. 180 tablet 3  . fentaNYL (DURAGESIC - DOSED MCG/HR) 75 MCG/HR Place 1 patch (75 mcg total) onto the skin every 3 (three) days. (Patient not taking: Reported on 07/30/2017) 5 patch 0  . fentaNYL (SUBLIMAZE) 100 MCG/2ML injection Inject 0.5-2 mLs (25-100 mcg total) into the vein every 2 (two) hours as  needed for severe pain. (Patient not taking: Reported on 07/30/2017) 2 mL 0  . gabapentin (NEURONTIN) 250 MG/5ML solution Place 6 mLs (300 mg total) into feeding tube every 8 (eight) hours. (Patient not taking: Reported on 01/23/2018)  12  . guaiFENesin (ROBITUSSIN) 100 MG/5ML SOLN Place 5 mLs (100 mg total) into feeding tube every 6 (six) hours. (Patient not taking: Reported on 07/30/2017) 1200 mL 0  . heparin 100-0.45 UNIT/ML-% infusion Inject 1,750 Units/hr into the vein continuous. (Patient not taking: Reported on 07/30/2017) 250 mL   . insulin aspart (NOVOLOG) 100 UNIT/ML injection Inject 0-15 Units into the skin every 4 (four) hours. (Patient not taking: Reported on 07/30/2017) 10 mL 11  . lacosamide 100 MG TABS Place 1 tablet (100 mg total) into feeding tube 2 (two) times daily. (Patient not taking: Reported on 01/23/2018) 60 tablet   . Nutritional Supplements (FEEDING SUPPLEMENT, VITAL HIGH PROTEIN,) LIQD liquid Place 1,000 mLs into feeding tube continuous. (Patient not taking: Reported on 07/30/2017)    . OXcarbazepine (TRILEPTAL) 150 MG tablet Take 1 tablet (150 mg total) by mouth 2 (two) times daily. For 1 week, then 2 tablets (380m) twice daily (Patient not taking: Reported on 01/23/2018) 120 tablet 3  . oxyCODONE (OXY IR/ROXICODONE) 5 MG immediate release tablet TK 1 T PO Q 6 H PRN P  0  . predniSONE (DELTASONE) 20 MG tablet Place 2  tablets (40 mg total) into feeding tube daily with breakfast. (Patient not taking: Reported on 01/23/2018)    . sodium chloride flush (NS) 0.9 % SOLN 10-40 mLs by Intracatheter route every 12 (twelve) hours. (Patient not taking: Reported on 07/30/2017)    . traMADol (ULTRAM) 50 MG tablet TK 1 T PO Q 6 H FOR 5 DAYS  0  . valproate 500 mg in dextrose 5 % 50 mL Inject 500 mg into the vein 2 (two) times daily. (Patient not taking: Reported on 07/30/2017)    . vancomycin (VANCOCIN) 50 mg/mL oral solution Place 2.5 mLs (125 mg total) into feeding tube every 6 (six) hours. (Patient not taking: Reported on 07/30/2017)    . Water For Irrigation, Sterile (FREE WATER) SOLN Place 100 mLs into feeding tube every 8 (eight) hours. (Patient not taking: Reported on 07/30/2017)     No current facility-administered medications for this visit.     Review of Systems  HENT: HENT negative.  Eyes: Eyes negative.  Respiratory: Positive for cough, shortness of breath and wheezing.  GI: Gastrointestinal negative.  Musculoskeletal: Musculoskeletal negative.  Skin: Skin negative.  Neurological: Positive for focal weakness.  Hematologic:       Blood clot Psychiatric: Positive for depressed mood.        Objective:  Objective   Vitals:   01/23/18 1058  BP: 135/89  Pulse: (!) 110  Resp: 20  SpO2: 93%  Weight: 237 lb (107.5 kg)  Height: 6' (1.829 m)   Body mass index is 32.14 kg/m.  Physical Exam  Constitutional: He is oriented to person, place, and time. He appears well-developed.  HENT:  Head: Normocephalic.  Eyes: Pupils are equal, round, and reactive to light.  Neck: Normal range of motion. Neck supple.  Cardiovascular: Normal rate.  Pulses:      Radial pulses are 2+ on the right side, and 2+ on the left side.       Femoral pulses are 2+ on the right side, and 2+ on the left side. Bilateral pt signals  Pulmonary/Chest: Effort normal. No respiratory distress.  Abdominal: Soft.  Musculoskeletal: He  exhibits no edema.  Neurological: He is alert and oriented to person, place, and time.  Skin: Skin is warm and dry.  Psychiatric: He has a normal mood and affect. His behavior is normal. Judgment and thought content normal.    Data: I have independently interpreted his ABIs to be 0.7 on the right 0.64 left.  TBI is 0.51 right 0.55 left     Assessment/Plan:     52 year old male follows up with ABIs today after being hospitalized and found to have bilateral SFA occlusions.  At that time he was critically ill on vasopressors and had MRSA bronchitis.  He has had an episode of subsequent pneumonia.  We discussed the need for smoking cessation for both his pulmonary and peripheral arterial disease is and he demonstrates good understanding but does not seem very excited to stop at this time given some stressful situations in his life.  He is on Eliquis for DVT while in the hospital and does not have any previous history of DVT at this point 6 months out is probably okay to stop it but I will touch base with his primary care provider.  I would like him to take baby aspirin whether or not he continues Eliquis.  From a peripheral arterial standpoint his feet appear well-perfused he does not have any wounds and pain is likely postsurgical.  We will have him follow-up in 1 year with repeat ABIs and he demonstrates good understanding.     Waynetta Sandy MD [facility]

## 2018-01-27 ENCOUNTER — Ambulatory Visit: Payer: Medicare Other | Admitting: Neurology

## 2018-01-27 ENCOUNTER — Encounter

## 2018-03-12 ENCOUNTER — Other Ambulatory Visit: Payer: Self-pay | Admitting: Neurology

## 2018-04-09 ENCOUNTER — Encounter: Payer: Self-pay | Admitting: Gastroenterology

## 2018-04-23 ENCOUNTER — Encounter: Payer: Medicare Other | Admitting: Internal Medicine

## 2018-04-27 ENCOUNTER — Ambulatory Visit: Payer: Medicare Other | Admitting: Gastroenterology

## 2018-04-27 ENCOUNTER — Encounter: Payer: Self-pay | Admitting: Gastroenterology
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
## 2018-05-08 ENCOUNTER — Ambulatory Visit (INDEPENDENT_AMBULATORY_CARE_PROVIDER_SITE_OTHER): Payer: Medicare Other | Admitting: Neurology

## 2018-05-08 ENCOUNTER — Other Ambulatory Visit (INDEPENDENT_AMBULATORY_CARE_PROVIDER_SITE_OTHER): Payer: Medicare Other

## 2018-05-08 ENCOUNTER — Encounter: Payer: Self-pay | Admitting: Neurology

## 2018-05-08 VITALS — BP 124/86 | HR 86 | Ht 72.0 in | Wt 233.0 lb

## 2018-05-08 DIAGNOSIS — M792 Neuralgia and neuritis, unspecified: Secondary | ICD-10-CM

## 2018-05-08 DIAGNOSIS — R4189 Other symptoms and signs involving cognitive functions and awareness: Secondary | ICD-10-CM | POA: Diagnosis not present

## 2018-05-08 DIAGNOSIS — Z79899 Other long term (current) drug therapy: Secondary | ICD-10-CM

## 2018-05-08 DIAGNOSIS — G0490 Encephalitis and encephalomyelitis, unspecified: Secondary | ICD-10-CM

## 2018-05-08 DIAGNOSIS — G4440 Drug-induced headache, not elsewhere classified, not intractable: Secondary | ICD-10-CM

## 2018-05-08 DIAGNOSIS — G40209 Localization-related (focal) (partial) symptomatic epilepsy and epileptic syndromes with complex partial seizures, not intractable, without status epilepticus: Secondary | ICD-10-CM | POA: Diagnosis not present

## 2018-05-08 LAB — COMPREHENSIVE METABOLIC PANEL
ALT: 12 U/L (ref 0–53)
AST: 16 U/L (ref 0–37)
Albumin: 4.4 g/dL (ref 3.5–5.2)
Alkaline Phosphatase: 52 U/L (ref 39–117)
BUN: 13 mg/dL (ref 6–23)
CO2: 33 meq/L — AB (ref 19–32)
Calcium: 9.9 mg/dL (ref 8.4–10.5)
Chloride: 104 mEq/L (ref 96–112)
Creatinine, Ser: 0.93 mg/dL (ref 0.40–1.50)
GFR: 90.16 mL/min (ref >60.00)
GLUCOSE: 98 mg/dL (ref 70–99)
POTASSIUM: 4.6 meq/L (ref 3.5–5.1)
SODIUM: 141 meq/L (ref 135–145)
TOTAL PROTEIN: 7.4 g/dL (ref 6.0–8.3)
Total Bilirubin: 0.5 mg/dL (ref 0.2–1.2)

## 2018-05-08 LAB — CBC
HEMATOCRIT: 53.6 % — AB (ref 39.0–52.0)
HEMOGLOBIN: 17.8 g/dL — AB (ref 13.0–17.0)
MCHC: 33.1 g/dL (ref 30.0–36.0)
MCV: 92.7 fl (ref 78.0–100.0)
PLATELETS: 162 10*3/uL (ref 150.0–400.0)
RBC: 5.78 Mil/uL (ref 4.22–5.81)
RDW: 17.5 % — AB (ref 11.5–15.5)
WBC: 7.7 10*3/uL (ref 4.0–10.5)

## 2018-05-08 MED ORDER — GABAPENTIN 300 MG PO CAPS: 300.0000 mg | ORAL_CAPSULE | Freq: Two times a day (BID) | ORAL | 5 refills | Status: DC

## 2018-05-08 NOTE — Patient Instructions (Addendum)
1.  Continue Depakote ER 1000mg  daily and oxcarbazepine 300mg  twice daily 2.  Start gabapentin 300mg  twice daily for nerve pain 3.  Check CBC, CMP 4.  Follow up in 6 months.  Your provider has requested that you have labwork completed today. Please go to [facility] ([id number]) on the second floor of this building before leaving the office today. You do not need to check in. If you are not called within 15 minutes please check with the front desk.

## 2018-05-19 ENCOUNTER — Ambulatory Visit (INDEPENDENT_AMBULATORY_CARE_PROVIDER_SITE_OTHER): Payer: Medicare Other | Admitting: Orthopedic Surgery

## 2018-05-19 ENCOUNTER — Ambulatory Visit: Payer: Medicare Other | Admitting: Gastroenterology

## 2018-05-29 ENCOUNTER — Other Ambulatory Visit (INDEPENDENT_AMBULATORY_CARE_PROVIDER_SITE_OTHER): Payer: Self-pay | Admitting: Orthopedic Surgery

## 2018-06-10 ENCOUNTER — Other Ambulatory Visit: Payer: Self-pay | Admitting: Neurology

## 2018-06-15 ENCOUNTER — Ambulatory Visit (INDEPENDENT_AMBULATORY_CARE_PROVIDER_SITE_OTHER): Payer: Medicare Other | Admitting: Orthopedic Surgery

## 2018-06-22 ENCOUNTER — Telehealth: Payer: Self-pay | Admitting: *Deleted

## 2018-06-22 NOTE — Telephone Encounter (Signed)
Hi Harold Bailey, Please review and advice if this patient  is appropriate, for [facility]. Neurology note for 05/08/18, stating seizures, not sure that they are controlled. Thanks, Santiago Glad

## 2018-06-22 NOTE — Telephone Encounter (Signed)
Harold Bailey,  This pt is cleared for anesthetic care at [facility].   Thanks,  Shandrea Lusk 

## 2018-06-23 ENCOUNTER — Ambulatory Visit (INDEPENDENT_AMBULATORY_CARE_PROVIDER_SITE_OTHER): Payer: Medicare Other | Admitting: Orthopedic Surgery

## 2018-06-24 ENCOUNTER — Encounter (INDEPENDENT_AMBULATORY_CARE_PROVIDER_SITE_OTHER): Payer: Self-pay | Admitting: Family

## 2018-06-24 ENCOUNTER — Ambulatory Visit (INDEPENDENT_AMBULATORY_CARE_PROVIDER_SITE_OTHER): Payer: Medicare Other | Admitting: Family

## 2018-06-24 VITALS — Ht 72.0 in | Wt 233.0 lb

## 2018-06-24 DIAGNOSIS — M722 Plantar fascial fibromatosis: Secondary | ICD-10-CM

## 2018-06-24 DIAGNOSIS — Z981 Arthrodesis status: Secondary | ICD-10-CM | POA: Diagnosis not present

## 2018-06-26 NOTE — Telephone Encounter (Signed)
Noted. Thanks.

## 2018-07-01 ENCOUNTER — Encounter (INDEPENDENT_AMBULATORY_CARE_PROVIDER_SITE_OTHER): Payer: Self-pay | Admitting: Family

## 2018-07-01 DIAGNOSIS — M722 Plantar fascial fibromatosis: Secondary | ICD-10-CM | POA: Diagnosis not present

## 2018-07-01 MED ORDER — METHYLPREDNISOLONE ACETATE 40 MG/ML IJ SUSP
40.0000 mg | INTRAMUSCULAR | Status: AC | PRN
  Administered 2018-07-01: 40 mg

## 2018-07-01 MED ORDER — LIDOCAINE HCL 1 % IJ SOLN
2.0000 mL | INTRAMUSCULAR | Status: AC | PRN
  Administered 2018-07-01: 2 mL

## 2018-07-01 NOTE — Progress Notes (Signed)
Office Visit Note   Patient: Harold Bailey           Date of Birth: 1966-02-25           MRN: [id number] Visit Date: 06/24/2018              Requested by: Everardo Beals, NP [facility] [address] [place], [place] [address] PCP: Everardo Beals, NP  Chief Complaint  Patient presents with  . Left Ankle - Follow-up, Pain  . Right Foot - Pain    Possibly ingrown toenail(Big Toe)      HPI: 52 year old gentleman who presents today complaining of left ankle pain describes this is mild.  Also complaining of left heel pain as well as pain to his great toe on his right foot.  The left ankle pain is chronic.  Patient status post left ankle fusion.  Reports he has had to resume work increasing his working hours is on his feet for about 4 and half hours a day this is much more than he is accustomed to.  He states he does wear a brace on his left ankle however has dependent swelling over the course of his workday is currently taking BC powder for pain occasionally taking Tylenol.  Complaining of worst pain to his left heel, on the bottom.  Has had injections for similar pain in the past which worked well.  Patient is also following with podiatry.  States that the week ago he had medial border of his great toenail removed complaining of pain in this area today.  No drainage no redness.  Assessment & Plan: Visit Diagnoses:  1. H/O ankle fusion   2. Plantar fasciitis of left foot     Plan: Reassurance provided.  Pain to the great toe normal following nail removal.  He will follow as scheduled with podiatry.  Discussed elevation and compression for swelling.  Will provide plantar fascia injection today.  Patient tolerated well.  Follow-Up Instructions: Return if symptoms worsen or fail to improve.   Left Ankle Exam   Tenderness  The patient is experiencing no tenderness.  Swelling: mild (no pitting)  Range of Motion  Dorsiflexion: abnormal Left ankle  dorsiflexion: s/p fusion.  Plantar flexion: abnormal       Patient is alert, oriented, no adenopathy, well-dressed, normal affect, normal respiratory effort. Point Tender to origin of plantar fascia on left. No pain to course of plantar fascia. No pain with lateral compression of calcaneus.  Great toe on right with out swelling or redness. Does have medial half of nail plate removed. No drainage. Tender to palpation.   Imaging: No results found. No images are attached to the encounter.  Labs: Lab Results  Component Value Date   HGBA1C 6.3 (H) 06/10/2017   HGBA1C 6.3 (H) 05/28/2017   HGBA1C 5.9 (H) 11/22/2013   REPTSTATUS 05/21/2017 FINAL 05/16/2017   REPTSTATUS 05/21/2017 FINAL 05/16/2017   GRAMSTAIN  05/11/2017    ABUNDANT WBC PRESENT, PREDOMINANTLY PMN FEW GRAM NEGATIVE RODS    CULT NO GROWTH 5 DAYS 05/16/2017   CULT NO GROWTH 5 DAYS 05/16/2017   LABORGA METHICILLIN RESISTANT STAPHYLOCOCCUS AUREUS 05/11/2017     Lab Results  Component Value Date   ALBUMIN 4.4 05/08/2018   ALBUMIN 2.3 (L) 05/28/2017   ALBUMIN 2.8 (L) 05/12/2017    Body mass index is 31.6 kg/m.  Orders:  No orders of the defined types were placed in this encounter.  No orders of the defined types  were placed in this encounter.    Procedures: Foot Inj Date/Time: 07/01/2018 9:30 AM Performed by: Suzan Slick, NP Authorized by: Suzan Slick, NP   Consent Given by:  Patient Site marked: the procedure site was marked   Timeout: prior to procedure the correct patient, procedure, and site was verified   Indications:  Fasciitis and pain Condition: Plantar Fasciitis   Location: left plantar fascia muscle   Prep: patient was prepped and draped in usual sterile fashion   Needle Size:  22 G Medications:  2 mL lidocaine 1 %; 40 mg methylPREDNISolone acetate 40 MG/ML Patient Tolerance:  Patient tolerated the procedure well with no immediate complications    Clinical Data: No additional  findings.  ROS:  All other systems negative, except as noted in the HPI. Review of Systems  Constitutional: Negative for chills and fever.  Musculoskeletal: Positive for arthralgias, gait problem and myalgias.  Skin: Negative for wound.    Objective: Vital Signs: Ht 6' (1.829 m)   Wt 233 lb (105.7 kg)   BMI 31.60 kg/m   Specialty Comments:  No specialty comments available.  PMFS History: Patient Active Problem List   Diagnosis Date Noted  . Difficult intravenous access   . Acute on chronic respiratory failure with hypoxia and hypercapnia (HCC)   . Essential hypertension 05/11/2017  . Pneumonia 05/11/2017  . SOB (shortness of breath)   . Plantar fasciitis of left foot 01/14/2017  . Traumatic arthritis of ankle, right 01/07/2017  . H/O ankle fusion 11/14/2016  . 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 At risk for adverse drug event 10/08/2016  . History of snoring 10/08/2016  . Subtalar joint instability, left   . 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 History of alcohol abuse 07/02/2016  . COPD exacerbation ([place]) 07/02/2016  . Tobacco dependence 06/06/2015  . Medication overuse headache 02/28/2015  . Localization-related symptomatic epilepsy and epileptic syndromes with complex partial seizures, not intractable, without status epilepticus ([place]) 02/28/2015  . Cognitive impairment 02/28/2015  . 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 Tobacco abuse 02/28/2015  . Brain tumor ([place]) 11/19/2013  . Seizures ([place]) 11/19/2013  . Memory loss   . HA (headache)   . Gait disturbance   . Falls frequently 10/15/2013  . Swelling 09/09/2013  . Rash and nonspecific skin eruption 09/09/2013  . Ankle fracture, lateral malleolus, closed 09/03/2013  . Unspecified vitamin D deficiency 08/12/2013  . Polycythemia   . Memory deficit   . History of drug abuse ([place])   . GERD (gastroesophageal reflux disease)   . Anxiety   . Arthritis   . Hyperlipidemia   . Prediabetes      Past Medical History:  Diagnosis Date  . Anxiety   . Arthritis    Left ankle  . COPD (chronic obstructive pulmonary disease) ([place])   . Drug abuse ([place])   . Gait disturbance   . GERD (gastroesophageal reflux disease)   . 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  Marland Kitchen Hyperlipidemia   . Hypertension   . 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 Memory loss   . Peripheral vision loss    Left due to brain surgery  . Pneumonia   . Polycythemia   . Prediabetes   . Seizures ([place]) 11/19/2013   none since 2016  . Shortness of breath    Hx of smoking.    Family History  Problem  Relation Age of Onset  . Hypertension Mother   . Bronchitis Mother   . Cancer Maternal Aunt   . Cancer Maternal Grandmother   . Stroke Neg Hx   . Diabetes Neg Hx     Past Surgical History:  Procedure Laterality Date  . ABDOMINAL AORTOGRAM W/LOWER EXTREMITY N/A 05/30/2017   Procedure: ABDOMINAL AORTOGRAM W/LOWER EXTREMITY;  Surgeon: Elam Dutch, MD;  Location: [facility];  Service: Cardiovascular;  Laterality: N/A;  . ANKLE ARTHROSCOPY  01/17/2012   Procedure: ANKLE ARTHROSCOPY;  Surgeon: [name], MD;  Location: [facility];  Service: Orthopedics;  Laterality: Left;  . ANKLE ARTHROSCOPY WITH FUSION Left 10/02/2016   Procedure: Left Posterior Subtalar Arthrodesis Arthroscopy;  Surgeon: [name], MD;  Location: [facility];  Service: Orthopedics;  Laterality: Left;  . ANKLE FUSION Left 11/2012   Dr Sharol Given  . ANKLE SURGERY  2003   Left  . BRAIN SURGERY  2009   Biopsy  . FRACTURE SURGERY  2003   Left ankle  . HARDWARE REMOVAL Left 12/08/2012   Procedure: HARDWARE REMOVAL;  Surgeon: [name], MD;  Location: [facility];  Service: Orthopedics;  Laterality: Left;  Removal Deep Hardware, Take Down Non-[place], Revision Internal Fixation Left Ankle  . I&D EXTREMITY Left 01/01/2013   Procedure: IRRIGATION AND DEBRIDEMENT EXTREMITY;  Surgeon:  [name], MD;  Location: [facility];  Service: Orthopedics;  Laterality: Left;  Irrigation, Debridement and Placement Antibiotic Beads Left Ankle  . INNER EAR SURGERY    . OPEN REDUCTION INTERNAL FIXATION (ORIF) SCAPHOID WITH DISTAL RADIUS GRAFT Right 02/01/2013   Procedure: OPEN REDUCTION INTERNAL FIXATION (ORIF) RIGHT SCAPHOID FRACTURE WITH DISTAL RADIUS GRAFT;  Surgeon: Schuyler Amor, MD;  Location: [facility];  Service: Orthopedics;  Laterality: Right;  . ORIF ANKLE FRACTURE Left 12/08/2012   Procedure: OPEN REDUCTION INTERNAL FIXATION (ORIF) ANKLE FRACTURE;  Surgeon: [name], MD;  Location: [facility];  Service: Orthopedics;  Laterality: Left;  Removal Deep Hardware, Take Down Non-[place], Revision Internal Fixation Left Ankle  . ORIF ANKLE FRACTURE Right 09/03/2013   Procedure: OPEN REDUCTION INTERNAL FIXATION (ORIF) ANKLE FRACTURE;  Surgeon: [name], MD;  Location: [facility];  Service: Orthopedics;  Laterality: Right;  Open Reduction Internal Fixation Right Fibula  . SCAPHOID FRACTURE SURGERY  02/04/2013  . SHOULDER ARTHROSCOPY  2008   Left  . TYMPANOSTOMY TUBE PLACEMENT  1993   Social History   Occupational History    Employer: NOT EMPLOYED    Comment: Disabled  Tobacco Use  . Smoking status: Current Every Day Smoker    Packs/day: 0.25    Years: 30.00    Pack years: 7.50    Types: Cigarettes  . Smokeless tobacco: Never Used  Substance and Sexual Activity  . Alcohol use: No    Alcohol/week: 0.0 standard drinks    Comment: quit Oct. 2017(heavy drinker) .In [place]  . Drug use: No  . Sexual activity: Not Currently

## 2018-07-10 ENCOUNTER — Encounter: Payer: Medicare Other | Admitting: Internal Medicine

## 2018-07-17 ENCOUNTER — Other Ambulatory Visit (INDEPENDENT_AMBULATORY_CARE_PROVIDER_SITE_OTHER): Payer: Self-pay | Admitting: Orthopedic Surgery

## 2018-08-07 ENCOUNTER — Encounter: Payer: Self-pay | Admitting: Internal Medicine

## 2018-08-26 ENCOUNTER — Encounter: Payer: Self-pay | Admitting: *Deleted

## 2018-08-26 ENCOUNTER — Telehealth: Payer: Self-pay | Admitting: *Deleted

## 2018-08-26 DIAGNOSIS — Z9981 Dependence on supplemental oxygen: Secondary | ICD-10-CM

## 2018-08-26 HISTORY — DX: Dependence on supplemental oxygen: Z99.81

## 2018-08-26 NOTE — Telephone Encounter (Signed)
I guess we missed the part about oxygen therapy.  Have him see one of the advanced practitioners in the office for full assessment and thanks

## 2018-08-26 NOTE — Telephone Encounter (Signed)
Pt states he has no feeding tube at the present, he ambulates on his own, he does use 4 liters of 02 at night every night due to his COPD , he has a very congested cough, he does not use cpap- .  He has no seizures since 2016 per pt.  due to his 02,  Does he need OV?  Or hospital case??  Thanks again, Lelan Pons

## 2018-08-26 NOTE — Telephone Encounter (Signed)
Dr Henrene Pastor,  This gentleman is on for a PV 12-17 and a direct colon with you 09-22-18.  He has a VERY complicated medical hx.  He has never seen GI.  He has a hx of a brain tumor, frequent falls, DM on insulin, GERD, memory deficit that is worsening, seizures- last noted 2016, hx of drug abuse, COPD, HTN, feeding tube.  Back in September he was scheduled and J. Nulty said he was okay for the [place].  I just wanted to be sure he was okay for a direct or with his hx if he needed an OV.  Please advise, and thank you so much for your time, .  Lelan Pons PV

## 2018-08-26 NOTE — Telephone Encounter (Signed)
If he is ambulatory, able to follow instructions, and motivated for screening colonoscopy, then okay for colonoscopy in [place] directly.  Thank you for checking

## 2018-08-26 NOTE — Telephone Encounter (Signed)
Called pt back- explained the need for an OV- Scheduled ov for Wednesday 12-11 at 315 pm, arrive at 3 with Darrell Jewel- pt verbalized appt date  and time, states wrote it down cancelled PV 12-17 and colon 12-31  [facility]

## 2018-09-02 ENCOUNTER — Ambulatory Visit (INDEPENDENT_AMBULATORY_CARE_PROVIDER_SITE_OTHER): Payer: Medicare Other | Admitting: Physician Assistant

## 2018-09-02 ENCOUNTER — Encounter: Payer: Self-pay | Admitting: Physician Assistant

## 2018-09-02 ENCOUNTER — Other Ambulatory Visit (INDEPENDENT_AMBULATORY_CARE_PROVIDER_SITE_OTHER): Payer: Self-pay | Admitting: Orthopedic Surgery

## 2018-09-02 ENCOUNTER — Encounter (INDEPENDENT_AMBULATORY_CARE_PROVIDER_SITE_OTHER): Payer: Self-pay

## 2018-09-02 VITALS — BP 126/80 | HR 107 | Ht 72.0 in | Wt 242.0 lb

## 2018-09-02 DIAGNOSIS — Z01818 Encounter for other preprocedural examination: Secondary | ICD-10-CM

## 2018-09-02 DIAGNOSIS — Z9981 Dependence on supplemental oxygen: Secondary | ICD-10-CM | POA: Diagnosis not present

## 2018-09-02 DIAGNOSIS — Z1211 Encounter for screening for malignant neoplasm of colon: Secondary | ICD-10-CM | POA: Diagnosis not present

## 2018-09-02 DIAGNOSIS — R7981 Abnormal blood-gas level: Secondary | ICD-10-CM | POA: Diagnosis not present

## 2018-09-02 NOTE — Patient Instructions (Signed)
We have sent in a referral to pulmonary.   We will call you to schedule a colonoscopy.

## 2018-09-02 NOTE — Progress Notes (Signed)
Chief Complaint: Preprocedural visit for screening colonoscopy in a patient with multiple medical comorbidities and home oxygen use  HPI:    Harold Bailey is a 52 year old male with a complicated past medical history including COPD on home oxygen, seizures, brain tumor, frequent falls, diabetes, GERD, history of drug abuse and feeding tube, who was initially scheduled Dr. Henrene Pastor for a direct colonoscopy, who was referred to me by Everardo Beals, NP for a preprocedural visit for a screening colonoscopy.      Today, the patient presents to clinic accompanied by his wife who does assist with his history.  He explains that he is doing fairly well other than "my breathing".  He tells me that he is on 4 L of oxygen at home but does not take this with him when he leaves the house, even though he is supposed to per his wife.  Currently, his oxygen saturation is 82% on room air.  Patient tells me he is having no GI issues.  Recalls a time when he was having some rectal bleeding back when he was 84 and had a colonoscopy which showed hemorrhoids and was otherwise normal.    Denies fever, chills, weight loss, anorexia, nausea, vomiting, heartburn, reflux, abdominal pain or change in bowel habits.     Past Medical History:  Diagnosis Date  . Anxiety   . Arthritis    Left ankle  . COPD (chronic obstructive pulmonary disease) ([place])   . Drug abuse ([place])   . Gait disturbance   . GERD (gastroesophageal reflux disease)   . 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  Marland Kitchen Hyperlipidemia   . Hypertension   . 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 Memory loss   . On home oxygen therapy 08/26/2018  . Peripheral vision loss    Left due to brain surgery  . Pneumonia   . Polycythemia   . Prediabetes   . Seizures ([place]) 11/19/2013   none since 2016  . Shortness of breath    Hx of smoking.    Past Surgical History:  Procedure Laterality  Date  . ABDOMINAL AORTOGRAM W/LOWER EXTREMITY N/A 05/30/2017   Procedure: ABDOMINAL AORTOGRAM W/LOWER EXTREMITY;  Surgeon: Elam Dutch, MD;  Location: [place] [facility];  Service: Cardiovascular;  Laterality: N/A;  . ANKLE ARTHROSCOPY  01/17/2012   Procedure: ANKLE ARTHROSCOPY;  Surgeon: [name], MD;  Location: [place];  Service: Orthopedics;  Laterality: Left;  . ANKLE ARTHROSCOPY WITH FUSION Left 10/02/2016   Procedure: Left Posterior Subtalar Arthrodesis Arthroscopy;  Surgeon: [name], MD;  Location: [place];  Service: Orthopedics;  Laterality: Left;  . ANKLE FUSION Left 11/2012   Dr Sharol Given  . ANKLE SURGERY  2003   Left  . BRAIN SURGERY  2009   Biopsy  . FRACTURE SURGERY  2003   Left ankle  . HARDWARE REMOVAL Left 12/08/2012   Procedure: HARDWARE REMOVAL;  Surgeon: [name], MD;  Location: [place];  Service: Orthopedics;  Laterality: Left;  Removal Deep Hardware, Take Down Non-Union, Revision Internal Fixation Left Ankle  . I&D EXTREMITY Left 01/01/2013   Procedure: IRRIGATION AND DEBRIDEMENT EXTREMITY;  Surgeon: [name], MD;  Location: [place];  Service: Orthopedics;  Laterality: Left;  Irrigation, Debridement and Placement Antibiotic Beads Left Ankle  . INNER EAR SURGERY    . OPEN REDUCTION INTERNAL FIXATION (ORIF) SCAPHOID WITH DISTAL RADIUS GRAFT  Right 02/01/2013   Procedure: OPEN REDUCTION INTERNAL FIXATION (ORIF) RIGHT SCAPHOID FRACTURE WITH DISTAL RADIUS GRAFT;  Surgeon: Schuyler Amor, MD;  Location: [place];  Service: Orthopedics;  Laterality: Right;  . ORIF ANKLE FRACTURE Left 12/08/2012   Procedure: OPEN REDUCTION INTERNAL FIXATION (ORIF) ANKLE FRACTURE;  Surgeon: [name], MD;  Location: [place];  Service: Orthopedics;  Laterality: Left;  Removal Deep Hardware, Take Down Non-Union, Revision Internal Fixation Left Ankle  . ORIF ANKLE FRACTURE Right 09/03/2013   Procedure: OPEN REDUCTION INTERNAL FIXATION (ORIF) ANKLE FRACTURE;  Surgeon: [name], MD;   Location: [place];  Service: Orthopedics;  Laterality: Right;  Open Reduction Internal Fixation Right Fibula  . SCAPHOID FRACTURE SURGERY  02/04/2013  . SHOULDER ARTHROSCOPY  2008   Left  . TYMPANOSTOMY TUBE PLACEMENT  1993    Current Outpatient Medications  Medication Sig Dispense Refill  . acetaminophen (TYLENOL) 325 MG tablet Place 2 tablets (650 mg total) into feeding tube every 6 (six) hours as needed for mild pain (or Fever >/= 101).    Marland Kitchen albuterol (PROVENTIL) (2.5 MG/3ML) 0.083% nebulizer solution Take 3 mLs (2.5 mg total) by nebulization every 6 (six) hours. 75 mL 12  . Alfalfa 600 MG TABS Take 600 mg by mouth daily.    . Amino Acids-Protein Hydrolys (FEEDING SUPPLEMENT, PRO-STAT SUGAR FREE 64,) LIQD Take 60 mLs by mouth 4 (four) times daily. 900 mL 0  . arformoterol (BROVANA) 15 MCG/2ML NEBU Take 2 mLs (15 mcg total) by nebulization 2 (two) times daily. 120 mL   . aspirin 81 MG chewable tablet Place 1 tablet (81 mg total) into feeding tube daily.    . budesonide (PULMICORT) 0.5 MG/2ML nebulizer solution Take 2 mLs (0.5 mg total) by nebulization 2 (two) times daily.  12  . Chlorhexidine Gluconate Cloth 2 % PADS Apply 6 each topically daily.    . chlorhexidine gluconate, MEDLINE KIT, (PERIDEX) 0.12 % solution 15 mLs by Mouth Rinse route 2 (two) times daily. 120 mL 0  . divalproex (DEPAKOTE ER) 500 MG 24 hr tablet Take 2 tablets (1,000 mg total) by mouth daily. 180 tablet 3  . famotidine (PEPCID) 20 MG tablet Take 20 mg by mouth 2 (two) times daily.    . fentaNYL (DURAGESIC - DOSED MCG/HR) 75 MCG/HR Place 1 patch (75 mcg total) onto the skin every 3 (three) days. 5 patch 0  . fentaNYL (SUBLIMAZE) 100 MCG/2ML injection Inject 0.5-2 mLs (25-100 mcg total) into the vein every 2 (two) hours as needed for severe pain. 2 mL 0  . gabapentin (NEURONTIN) 300 MG capsule Take 1 capsule (300 mg total) by mouth 2 (two) times daily. 60 capsule 5  . guaiFENesin (ROBITUSSIN) 100 MG/5ML SOLN Place 5 mLs  (100 mg total) into feeding tube every 6 (six) hours. 1200 mL 0  . ibuprofen (ADVIL,MOTRIN) 800 MG tablet TAKE 1 TABLET(800 MG) BY MOUTH EVERY 8 HOURS AS NEEDED 60 tablet 0  . insulin aspart (NOVOLOG) 100 UNIT/ML injection Inject 0-15 Units into the skin every 4 (four) hours. 10 mL 11  . lacosamide 100 MG TABS Place 1 tablet (100 mg total) into feeding tube 2 (two) times daily. 60 tablet   . meloxicam (MOBIC) 7.5 MG tablet Take 7.5 mg by mouth daily.    . meloxicam (MOBIC) 7.5 MG tablet TAKE 1 TABLET(7.5 MG) BY MOUTH DAILY 60 tablet 3  . Multiple Vitamins-Minerals (MULTIVITAMIN WITH MINERALS) tablet Take 1 tablet by mouth daily.    Marland Kitchen  Nutritional Supplements (FEEDING SUPPLEMENT, VITAL HIGH PROTEIN,) LIQD liquid Place 1,000 mLs into feeding tube continuous.    . OXcarbazepine (TRILEPTAL) 150 MG tablet Take 1 tablet (150 mg total) by mouth 2 (two) times daily. For 1 week, then 2 tablets ('300mg'$ ) twice daily 120 tablet 3  . Oxcarbazepine (TRILEPTAL) 300 MG tablet TAKE 1 TABLET(300 MG) BY MOUTH TWICE DAILY 180 tablet 1  . oxyCODONE (OXY IR/ROXICODONE) 5 MG immediate release tablet TK 1 T PO Q 6 H PRN P  0  . predniSONE (DELTASONE) 20 MG tablet Place 2 tablets (40 mg total) into feeding tube daily with breakfast.    . sodium chloride flush (NS) 0.9 % SOLN 10-40 mLs by Intracatheter route every 12 (twelve) hours.    . traMADol (ULTRAM) 50 MG tablet TK 1 T PO Q 6 H FOR 5 DAYS  0  . UNABLE TO FIND Take 1,200 mg by mouth. Med Name: Lecithin    . valproate 500 mg in dextrose 5 % 50 mL Inject 500 mg into the vein 2 (two) times daily.    . vancomycin (VANCOCIN) 50 mg/mL oral solution Place 2.5 mLs (125 mg total) into feeding tube every 6 (six) hours.    . VOLTAREN 1 % GEL APPLY 2 TO 4 GRAMS EXTERNALLY TO THE AFFECTED AREA TWICE DAILY 500 g 0  . Water For Irrigation, Sterile (FREE WATER) SOLN Place 100 mLs into feeding tube every 8 (eight) hours.     No current facility-administered medications for this visit.      Allergies as of 09/02/2018 - Review Complete 07/01/2018  Allergen Reaction Noted  . Hydrocodone Rash 07/29/2015  . Lyrica [pregabalin] Swelling 05/08/2018  . No known allergies  10/01/2016  . Other  05/06/2017    Family History  Problem Relation Age of Onset  . Hypertension Mother   . Bronchitis Mother   . Cancer Maternal Aunt   . Cancer Maternal Grandmother   . Stroke Neg Hx   . Diabetes Neg Hx     Social History   Socioeconomic History  . Marital status: Married    Spouse name: Vickie  . Number of children: 1  . Years of education: 65  . Highest education level: Not on file  Occupational History    Employer: NOT EMPLOYED    Comment: Disabled  Social Needs  . Financial resource strain: Not on file  . Food insecurity:    Worry: Not on file    Inability: Not on file  . Transportation needs:    Medical: Not on file    Non-medical: Not on file  Tobacco Use  . Smoking status: Current Every Day Smoker    Packs/day: 0.25    Years: 30.00    Pack years: 7.50    Types: Cigarettes  . Smokeless tobacco: Never Used  Substance and Sexual Activity  . Alcohol use: No    Alcohol/week: 0.0 standard drinks    Comment: quit Oct. 2017(heavy drinker) .In [place]  . Drug use: No  . Sexual activity: Not Currently  Lifestyle  . Physical activity:    Days per week: Not on file    Minutes per session: Not on file  . Stress: Not on file  Relationships  . Social connections:    Talks on phone: Not on file    Gets together: Not on file    Attends religious service: Not on file    Active member of club or organization: Not on file    Attends  meetings of clubs or organizations: Not on file    Relationship status: Not on file  . Intimate partner violence:    Fear of current or ex partner: Not on file    Emotionally abused: Not on file    Physically abused: Not on file    Forced sexual activity: Not on file  Other Topics Concern  . Not on file  Social History Narrative    Patient lives at home with his wife Maxwell Caul)    Disabled   Left handed   Education 12 th    Caffeine Coffee four cups    Review of Systems:    Constitutional: No weight loss, fever or chills Skin: No rash  Cardiovascular: No chest pain Respiratory: +chronic SOB Gastrointestinal: See HPI and otherwise negative Genitourinary: No dysuria  Neurological: No headache, dizziness or syncope Musculoskeletal: No new muscle or joint pain Hematologic: No bleeding Psychiatric: No history of depression or anxiety   Physical Exam:  Vital signs: BP 126/80   Pulse (!) 107   Ht 6' (1.829 m)   Wt 242 lb (109.8 kg)   SpO2 (!) 82%   BMI 32.82 kg/m   Constitutional:   Pleasant dusky appearing Caucasian male appears to be in NAD, Well developed, Well nourished, alert and cooperative Head:  Normocephalic and atraumatic. Eyes:   PEERL, EOMI. No icterus. Conjunctiva pink. Ears:  Normal auditory acuity. Neck:  Supple Throat: Oral cavity and pharynx without inflammation, swelling or lesion.  Respiratory: Respirations even and unlabored. Lungs clear to auscultation bilaterally.   No wheezes, crackles, or rhonchi.  Cardiovascular: Normal S1, S2. No MRG. Regular rate and rhythm. No peripheral edema, cyanosis or pallor.  Gastrointestinal:  Soft, nondistended, nontender. No rebound or guarding. Normal bowel sounds. No appreciable masses or hepatomegaly. Rectal:  Not performed.  Msk:  Symmetrical without gross deformities. Without edema, no deformity or joint abnormality.  Neurologic:  Alert and  oriented x4;  grossly normal neurologically.  Skin:   Dry and intact without significant lesions or rashes.  Psychiatric: Demonstrates good judgement and reason without abnormal affect or behaviors.  MOST RECENT LABS AND IMAGING: CBC    Component Value Date/Time   WBC 7.7 05/08/2018 1028   RBC 5.78 05/08/2018 1028   HGB 17.8 (H) 05/08/2018 1028   HCT 53.6 (H) 05/08/2018 1028   PLT 162.0 05/08/2018 1028    MCV 92.7 05/08/2018 1028   MCH 32.2 06/08/2017 0512   MCHC 33.1 05/08/2018 1028   RDW 17.5 (H) 05/08/2018 1028   LYMPHSABS 2.2 05/30/2017 0238   MONOABS 0.6 05/30/2017 0238   EOSABS 0.1 05/30/2017 0238   BASOSABS 0.0 05/30/2017 0238    CMP     Component Value Date/Time   NA 141 05/08/2018 1028   K 4.6 05/08/2018 1028   CL 104 05/08/2018 1028   CO2 33 (H) 05/08/2018 1028   GLUCOSE 98 05/08/2018 1028   BUN 13 05/08/2018 1028   CREATININE 0.93 05/08/2018 1028   CREATININE 1.06 11/22/2013 1723   CALCIUM 9.9 05/08/2018 1028   PROT 7.4 05/08/2018 1028   ALBUMIN 4.4 05/08/2018 1028   AST 16 05/08/2018 1028   ALT 12 05/08/2018 1028   ALKPHOS 52 05/08/2018 1028   BILITOT 0.5 05/08/2018 1028   GFRNONAA >60 06/08/2017 0512   GFRNONAA 82 11/22/2013 1723   GFRAA >60 06/08/2017 0512   GFRAA >89 11/22/2013 1723    Assessment: 1.  Screening for colorectal cancer: Patient is 36 and has not had a screening  colonoscopy since turning 77, history of distant colonoscopy when he was 3 for bleeding which turned out to be hemorrhoids per him, no GI complaints 2.  Home oxygen use: On 4 L at home for COPD, currently oxygen saturation is 82% on room air, patient tells me he is supposed to be on oxygen at all times but does not bring it with him  Plan: 1.  Patient will need to be scheduled for a colonoscopy in the hospital, this will be with Dr. Silverio Decamp as she is supervising this morning.  She does not have any availability in January and patient was told that we would need to wait until the schedule was available for February.  Did go ahead and discuss risks, benefits, limitations and alternatives and the patient agrees to proceed. 2.  Also referred the patient to pulmonology for his COPD and low oxygen saturation today 3.  Patient to follow in clinic per recommendations from Dr. Silverio Decamp after time of procedure.  Ellouise Newer, PA-C [facility] 09/02/2018, 3:16 PM  Cc: Everardo Beals, NP

## 2018-09-22 ENCOUNTER — Encounter: Payer: Medicare Other | Admitting: Internal Medicine

## 2018-09-29 ENCOUNTER — Telehealth: Payer: Self-pay | Admitting: Physician Assistant

## 2018-09-29 NOTE — Telephone Encounter (Signed)
[name] looks like you were going to call this pt when Dr Woodward Ku schedule is out.

## 2018-09-29 NOTE — Telephone Encounter (Signed)
Left message on patients voicemail to call office.   

## 2018-10-01 NOTE — Progress Notes (Signed)
Reviewed and agree with documentation and assessment and plan. K. Veena Nandigam , MD   

## 2018-10-08 ENCOUNTER — Other Ambulatory Visit: Payer: Self-pay | Admitting: Neurology

## 2018-10-12 ENCOUNTER — Institutional Professional Consult (permissible substitution): Payer: Medicare Other | Admitting: Pulmonary Disease

## 2018-10-15 ENCOUNTER — Ambulatory Visit (INDEPENDENT_AMBULATORY_CARE_PROVIDER_SITE_OTHER): Payer: Medicare Other | Admitting: Pulmonary Disease

## 2018-10-15 ENCOUNTER — Encounter: Payer: Self-pay | Admitting: Pulmonary Disease

## 2018-10-15 VITALS — BP 124/80 | HR 106 | Ht 72.0 in | Wt 242.0 lb

## 2018-10-15 DIAGNOSIS — J9611 Chronic respiratory failure with hypoxia: Secondary | ICD-10-CM | POA: Diagnosis not present

## 2018-10-15 DIAGNOSIS — J441 Chronic obstructive pulmonary disease with (acute) exacerbation: Secondary | ICD-10-CM

## 2018-10-15 DIAGNOSIS — H66002 Acute suppurative otitis media without spontaneous rupture of ear drum, left ear: Secondary | ICD-10-CM

## 2018-10-15 DIAGNOSIS — J432 Centrilobular emphysema: Secondary | ICD-10-CM | POA: Diagnosis not present

## 2018-10-15 DIAGNOSIS — H6692 Otitis media, unspecified, left ear: Secondary | ICD-10-CM | POA: Insufficient documentation

## 2018-10-15 MED ORDER — UMECLIDINIUM-VILANTEROL 62.5-25 MCG/INH IN AEPB: 1.0000 | INHALATION_SPRAY | Freq: Every day | RESPIRATORY_TRACT | 0 refills | Status: DC

## 2018-10-15 MED ORDER — CEPHALEXIN 500 MG PO CAPS: 500.0000 mg | ORAL_CAPSULE | Freq: Two times a day (BID) | ORAL | 0 refills | Status: DC

## 2018-10-15 NOTE — Assessment & Plan Note (Addendum)
  Trial of Anoro 1 puff daily, call me for prescription if this works Use albuterol 2 puffs every 6 hours as needed  CXR today

## 2018-10-15 NOTE — Progress Notes (Signed)
Subjective:    Patient ID: Harold Bailey, male    DOB: [birth year], 53 y.o.   MRN: [id number]  HPI  53 year old smoker presents for evaluation and management of COPD. He was evaluated for an elective colonoscopy and was found to have an oxygen saturation of 82% on room air and was referred for pulmonary clearance  He has never had pulmonary function testing done but was given this diagnosis after prolonged hospitalization in 04/2017 for 2 months that was complicated by chronic respiratory failure, MRSA pneumonia, C. difficile colitis and tracheostomy due to prolonged mechanical ventilation.  He was discharged on oxygen about 4 L at daytime and 2 L during sleep.  He admits that he is only using nocturnal oxygen and in the daytime only as needed.  He has a history of drug abuse and was in [place] for rehab and went through withdrawal of pain medications in 09/2017.  He is currently maintained on Xanax 1 mg thrice daily.  He also has seizure disorder on 2 anticonvulsants Bipolar disorder but states that he is stopped using his Trileptal.  He reports dyspnea on activities.  He reports intermittent wheezing but denies frequent chest colds. He used to smoke about 2 to 4 packs/day but now has cut down to 1.5 packs/day   Chest x-ray 06/12/2017 and 07/12/2016 was reviewed which shows hyperinflated lungs Spirometry today showed severe airway obstruction with ratio of 67, FEV1 of 39% FVC of 45%. He was taken off oxygen and saturation was 90% on room air and dropped to 87% on ambulating and recovered back to 94% with 2 L oxygen  Towards the end of the interview he also asked me to look into his ears due to purulent discharge    Past Medical History:  Diagnosis Date  . Anxiety   . Arthritis    Left ankle  . COPD (chronic obstructive pulmonary disease) ([place])   . COPD (chronic obstructive pulmonary disease) ([place])   . Drug abuse ([place])   . Gait disturbance   . GERD (gastroesophageal reflux  disease)   . 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  Marland Kitchen History of ankle surgery   . Hyperlipidemia   . Hypertension   . 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 Memory loss   . On home oxygen therapy 08/26/2018  . Peripheral vision loss    Left due to brain surgery  . Pneumonia   . Polycythemia   . Prediabetes   . Seizures ([place]) 11/19/2013   none since 2016  . Shortness of breath    Hx of smoking.    Past Surgical History:  Procedure Laterality Date  . ABDOMINAL AORTOGRAM W/LOWER EXTREMITY N/A 05/30/2017   Procedure: ABDOMINAL AORTOGRAM W/LOWER EXTREMITY;  Surgeon: Elam Dutch, MD;  Location: [place] [facility];  Service: Cardiovascular;  Laterality: N/A;  . ANKLE ARTHROSCOPY  01/17/2012   Procedure: ANKLE ARTHROSCOPY;  Surgeon: [name], MD;  Location: [place];  Service: Orthopedics;  Laterality: Left;  . ANKLE ARTHROSCOPY WITH FUSION Left 10/02/2016   Procedure: Left Posterior Subtalar Arthrodesis Arthroscopy;  Surgeon: [name], MD;  Location: [place];  Service: Orthopedics;  Laterality: Left;  . ANKLE FUSION Left 11/2012   Dr Sharol Given  . ANKLE SURGERY  2003   Left  . BRAIN SURGERY  2009   Biopsy  . FRACTURE SURGERY  2003   Left ankle  .  HARDWARE REMOVAL Left 12/08/2012   Procedure: HARDWARE REMOVAL;  Surgeon: [name], MD;  Location: [place];  Service: Orthopedics;  Laterality: Left;  Removal Deep Hardware, Take Down Non-Union, Revision Internal Fixation Left Ankle  . I&D EXTREMITY Left 01/01/2013   Procedure: IRRIGATION AND DEBRIDEMENT EXTREMITY;  Surgeon: [name], MD;  Location: [place];  Service: Orthopedics;  Laterality: Left;  Irrigation, Debridement and Placement Antibiotic Beads Left Ankle  . INNER EAR SURGERY    . OPEN REDUCTION INTERNAL FIXATION (ORIF) SCAPHOID WITH DISTAL RADIUS GRAFT Right 02/01/2013   Procedure: OPEN REDUCTION INTERNAL FIXATION (ORIF) RIGHT SCAPHOID FRACTURE WITH  DISTAL RADIUS GRAFT;  Surgeon: Schuyler Amor, MD;  Location: [place];  Service: Orthopedics;  Laterality: Right;  . ORIF ANKLE FRACTURE Left 12/08/2012   Procedure: OPEN REDUCTION INTERNAL FIXATION (ORIF) ANKLE FRACTURE;  Surgeon: [name], MD;  Location: [place];  Service: Orthopedics;  Laterality: Left;  Removal Deep Hardware, Take Down Non-Union, Revision Internal Fixation Left Ankle  . ORIF ANKLE FRACTURE Right 09/03/2013   Procedure: OPEN REDUCTION INTERNAL FIXATION (ORIF) ANKLE FRACTURE;  Surgeon: [name], MD;  Location: [place];  Service: Orthopedics;  Laterality: Right;  Open Reduction Internal Fixation Right Fibula  . SCAPHOID FRACTURE SURGERY  02/04/2013  . SHOULDER ARTHROSCOPY  2008   Left  . TYMPANOSTOMY TUBE PLACEMENT  1993      Allergies  Allergen Reactions  . Hydrocodone Rash  . Lyrica [Pregabalin] Swelling    Swelling and itching in hands  . No Known Allergies   . Other     No narcotics     Social History   Socioeconomic History  . Marital status: Married    Spouse name: Vickie  . Number of children: 1  . Years of education: 66  . Highest education level: Not on file  Occupational History    Employer: NOT EMPLOYED    Comment: Disabled  Social Needs  . Financial resource strain: Not on file  . Food insecurity:    Worry: Not on file    Inability: Not on file  . Transportation needs:    Medical: Not on file    Non-medical: Not on file  Tobacco Use  . Smoking status: Current Every Day Smoker    Packs/day: 0.25    Years: 30.00    Pack years: 7.50    Types: Cigarettes  . Smokeless tobacco: Never Used  Substance and Sexual Activity  . Alcohol use: No    Alcohol/week: 0.0 standard drinks    Comment: quit Oct. 2017(heavy drinker) .In [place]  . Drug use: No  . Sexual activity: Not Currently  Lifestyle  . Physical activity:    Days per week: Not on file    Minutes per session: Not on file  . Stress: Not on file  Relationships  . Social  connections:    Talks on phone: Not on file    Gets together: Not on file    Attends religious service: Not on file    Active member of club or organization: Not on file    Attends meetings of clubs or organizations: Not on file    Relationship status: Not on file  . Intimate partner violence:    Fear of current or ex partner: Not on file    Emotionally abused: Not on file    Physically abused: Not on file    Forced sexual activity: Not on file  Other Topics Concern  . Not on  file  Social History Narrative   Patient lives at home with his wife Maxwell Caul)    Disabled   Left handed   Education 12 th    Caffeine Coffee four cups     Family History  Problem Relation Age of Onset  . Hypertension Mother   . Bronchitis Mother   . Cancer Maternal Aunt   . Cancer Maternal Grandmother   . Stroke Neg Hx   . Diabetes Neg Hx      Review of Systems Constitutional: negative for anorexia, fevers and sweats  Eyes: negative for irritation, redness and visual disturbance  Ears, nose, mouth, throat, and face: negative for earaches, epistaxis, nasal congestion and sore throat  Respiratory: negative for  sputum and wheezing  Cardiovascular: negative for chest pain,  orthopnea, palpitations and syncope  Gastrointestinal: negative for abdominal pain, constipation, diarrhea, melena, nausea and vomiting  Genitourinary:negative for dysuria, frequency and hematuria  Hematologic/lymphatic: negative for bleeding, easy bruising and lymphadenopathy  Musculoskeletal:negative for arthralgias, muscle weakness and stiff joints  Neurological: negative for coordination problems, gait problems, headaches and weakness  Endocrine: negative for diabetic symptoms including polydipsia, polyuria and weight loss     Objective:   Physical Exam  Gen. Pleasant, well-nourished, in no distress, flat affect ENT - no pallor,icterus, no post nasal drip, purulent discharge left ear, dentures  Neck: No JVD, no  thyromegaly, no carotid bruits Lungs: no use of accessory muscles, no dullness to percussion, decreased BL without rales or rhonchi  Cardiovascular: Rhythm regular, heart sounds  normal, no murmurs or gallops, no peripheral edema Abdomen: soft and non-tender, no hepatosplenomegaly, BS normal. Musculoskeletal: No deformities, no cyanosis or clubbing Neuro:  alert, non focal       Assessment & Plan:

## 2018-10-15 NOTE — Assessment & Plan Note (Signed)
Use 2 L of oxygen during walking and sleep. Enroll in pulmonary rehab in [place] if you are willing

## 2018-10-15 NOTE — Assessment & Plan Note (Signed)
ENT referral Keflex 500 mg twice daily for 7 days

## 2018-10-15 NOTE — Patient Instructions (Addendum)
ENT referral Keflex 500 mg twice daily for 7 days  You have severe COPD You have to quit smoking altogether  Trial of Anoro 1 puff daily, call me for prescription if this works Use albuterol 2 puffs every 6 hours as needed  Use oxygen 2 L/min when you walk and during sleep. We can enroll you in pulmonary rehab at [facility]

## 2018-11-09 ENCOUNTER — Ambulatory Visit: Payer: Medicare Other | Admitting: Neurology
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
## 2018-11-11 ENCOUNTER — Encounter

## 2018-11-11 ENCOUNTER — Ambulatory Visit: Payer: Medicare Other | Admitting: Neurology

## 2018-11-19 ENCOUNTER — Ambulatory Visit: Payer: Medicare Other | Admitting: Pulmonary Disease

## 2018-11-24 ENCOUNTER — Encounter: Payer: Self-pay | Admitting: Neurology

## 2018-11-24 ENCOUNTER — Ambulatory Visit: Payer: Medicare HMO | Admitting: Neurology

## 2018-11-24 ENCOUNTER — Encounter
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
## 2018-12-01 ENCOUNTER — Encounter: Payer: Self-pay | Admitting: Pulmonary Disease

## 2018-12-01 ENCOUNTER — Ambulatory Visit (INDEPENDENT_AMBULATORY_CARE_PROVIDER_SITE_OTHER): Payer: Medicare HMO | Admitting: Pulmonary Disease

## 2018-12-01 DIAGNOSIS — Z720 Tobacco use: Secondary | ICD-10-CM

## 2018-12-01 DIAGNOSIS — J441 Chronic obstructive pulmonary disease with (acute) exacerbation: Secondary | ICD-10-CM | POA: Diagnosis not present

## 2018-12-01 DIAGNOSIS — J9611 Chronic respiratory failure with hypoxia: Secondary | ICD-10-CM

## 2018-12-01 MED ORDER — ALBUTEROL SULFATE HFA 108 (90 BASE) MCG/ACT IN AERS: 2.0000 | INHALATION_SPRAY | Freq: Four times a day (QID) | RESPIRATORY_TRACT | 6 refills | Status: AC | PRN

## 2018-12-01 MED ORDER — FLUTICASONE-UMECLIDIN-VILANT 100-62.5-25 MCG/INH IN AEPB: 1.0000 | INHALATION_SPRAY | Freq: Every day | RESPIRATORY_TRACT | 5 refills | Status: DC

## 2018-12-01 NOTE — Assessment & Plan Note (Signed)
Smoking cessation was emphasized is the most important intervention for him.  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 He has been able to cut down to 1 pack/day

## 2018-12-01 NOTE — Progress Notes (Signed)
   Subjective:    Patient ID: Harold Bailey, male    DOB: Jun 06, 1966, 53 y.o.   MRN: [id number]  HPI  53 year old smoker for FU of COPD & chronic hypoxic resp failure.  04/2017 Hosp  for 2 months for chronic respiratory failure, MRSA pneumonia, C. difficile colitis and tracheostomy due to prolonged mechanical ventilation.  He was discharged on oxygen about 4 L at daytime and 2 L during sleep.   PMH -substance abuse and was in [place] for rehab and went through withdrawal of pain medications in 09/2017.  He is currently maintained on Xanax 1 mg thrice daily.  BIpolar &  seizure disorder on 2 anticonvulsants  He used to smoke about 2 to 4 packs/day but now has cut down to 1 packs/day   On his last visit 09/2018, we gave him a sample of Anoro.  This really helped him but he never called back for prescription.  He would like to do away with his budesonide nebs too Regarding albuterol, he feels that the Ventolin did not help him as much.  He took Proventil inhaler from his girlfriend and that seems to work better for him.  He works a part-time job where he is exposed to welding smoke.  He also inquires about wood stove in his basement where he goes down to smoke  Oxygen saturation is 90% today.  He does not use oxygen in the daytime much but is compliant with this during sleep   Significant tests/ events reviewed  Chest x-ray 06/12/2017 and 07/12/2016 - hyperinflated lungs Spirometry 09/2018 severe airway obstruction with ratio of 67, FEV1 of 39% FVC of 45%.    Review of Systems neg for any significant sore throat, dysphagia, itching, sneezing, nasal congestion or excess/ purulent secretions, fever, chills, sweats, unintended wt loss, pleuritic or exertional cp, hempoptysis, orthopnea pnd or change in chronic leg swelling. Also denies presyncope, palpitations, heartburn, abdominal pain, nausea, vomiting, diarrhea or change in bowel or urinary habits, dysuria,hematuria, rash, arthralgias,  visual complaints, headache, numbness weakness or ataxia.     Objective:   Physical Exam   Gen. Pleasant, well-nourished, in no distress ENT - no thrush, no pallor/icterus,no post nasal drip Neck: No JVD, no thyromegaly, no carotid bruits Lungs: no use of accessory muscles, no dullness to percussion, decreased without rales or rhonchi  Cardiovascular: Rhythm regular, heart sounds  normal, no murmurs or gallops, no peripheral edema Musculoskeletal: No deformities, no cyanosis or clubbing         Assessment & Plan:

## 2018-12-01 NOTE — Assessment & Plan Note (Signed)
Prescription will be called in for Trelegy to take once daily. And Proventil MDI 2 puffs every 6 hours as needed-this would be a rescue pump.  He prefers to stop budesonide nebs  You have to quit smoking! Get started with pulmonary rehab at [facility]

## 2018-12-01 NOTE — Assessment & Plan Note (Signed)
He seems to qualify for oxygen 24/7 but prefers to use it only at night Hopefully during pulmonary rehab they will reinforce oxygen use in the daytime

## 2018-12-01 NOTE — Patient Instructions (Signed)
Prescription will be called in for Trelegy to take once daily. And Proventil MDI 2 puffs every 6 hours as needed-this would be a rescue pump.  You have to quit smoking! Get started with pulmonary rehab at [facility]

## 2018-12-02 ENCOUNTER — Encounter (HOSPITAL_COMMUNITY): Payer: Medicare Other

## 2018-12-05 ENCOUNTER — Other Ambulatory Visit: Payer: Self-pay | Admitting: Neurology

## 2018-12-05 IMAGING — DX DG CHEST 1V PORT
2 series · 2 of 2 positions shown · non-contrast
Comparison: July 02, 2016

CLINICAL DATA: Shortness of Breath

EXAM:
PORTABLE CHEST 1 VIEW

[chest ap (1 of 2)]
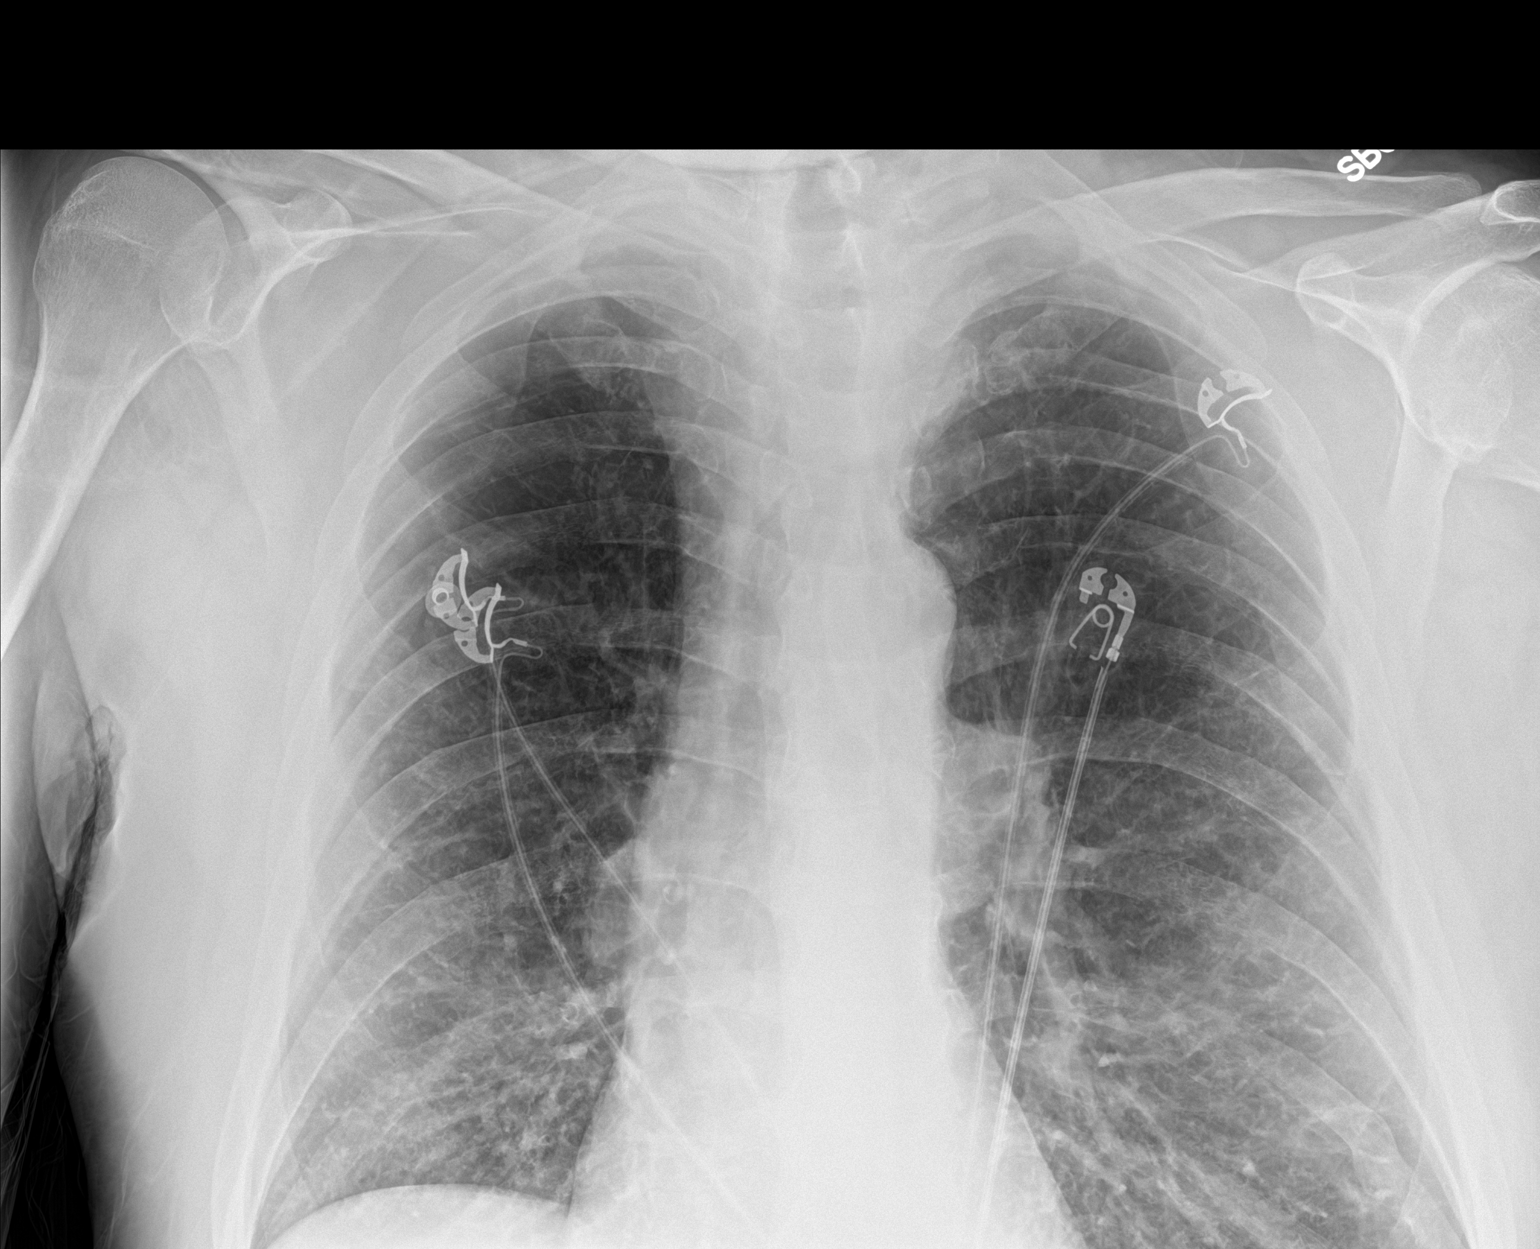

[chest ap (2 of 2)]
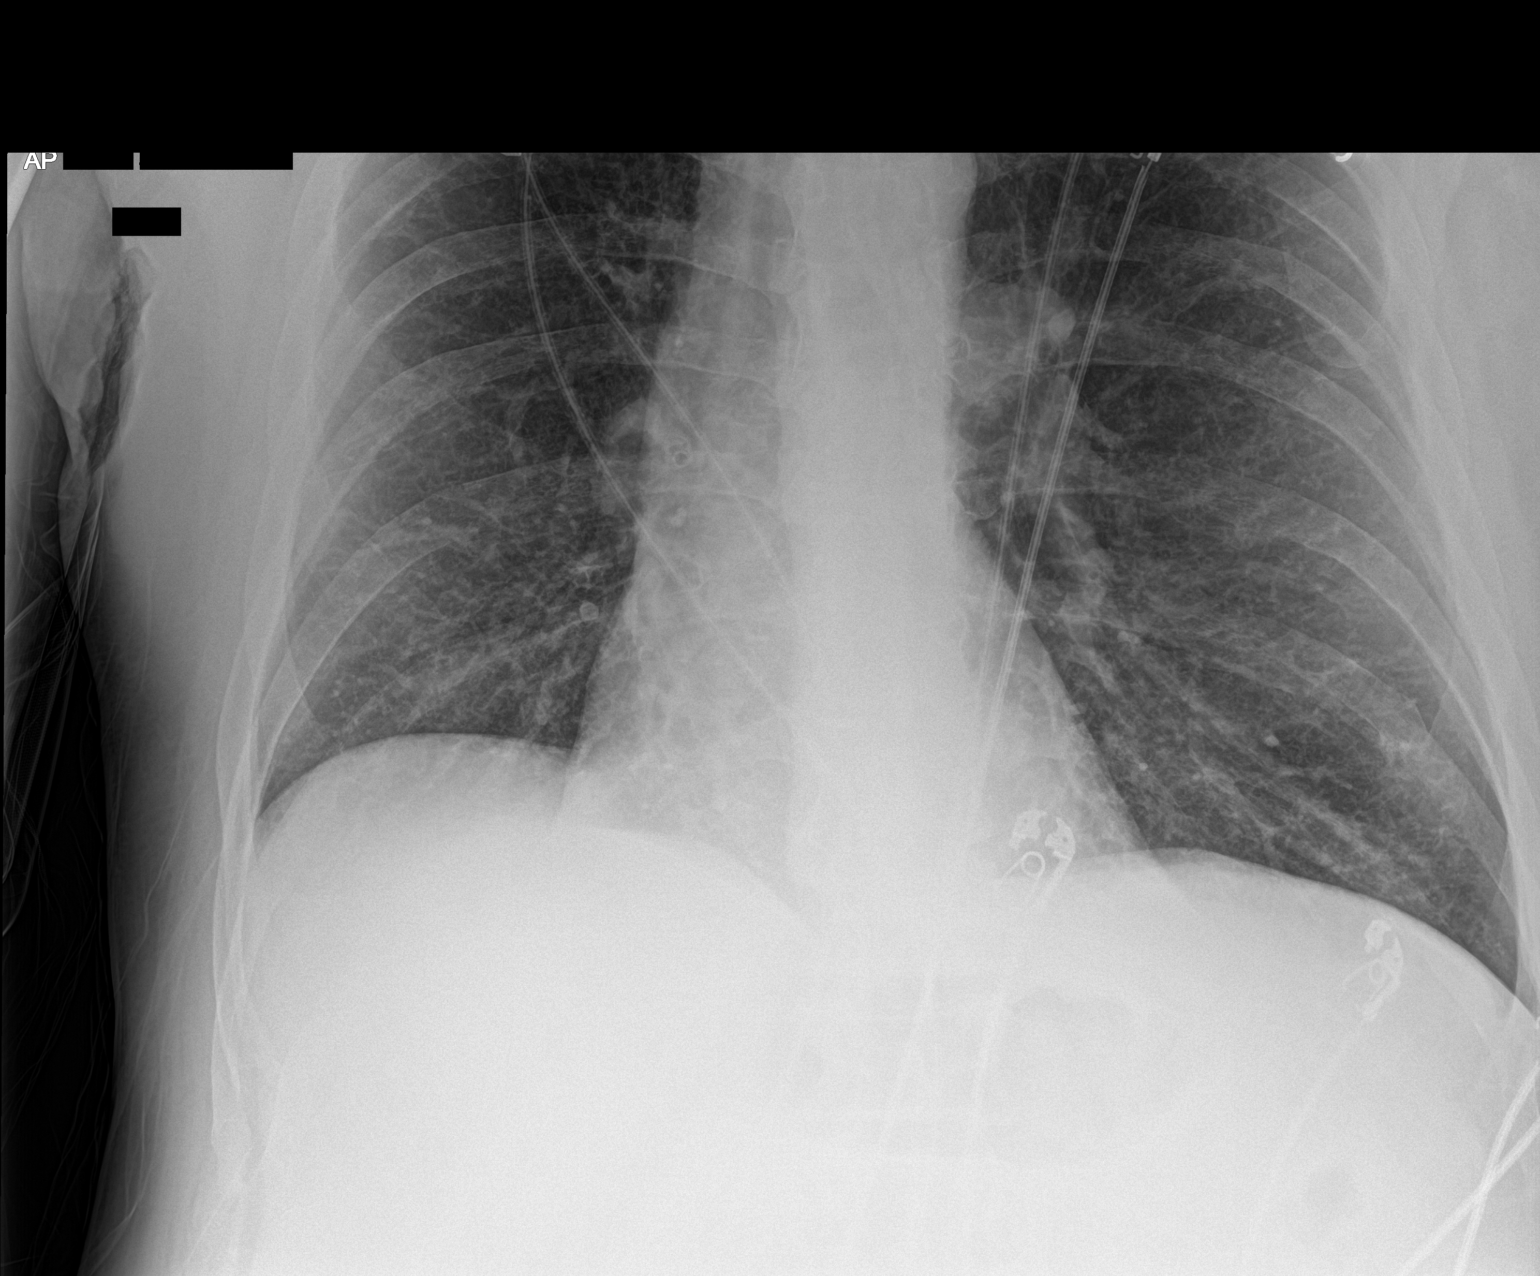

[2 of 2 positions shown; findings below may reference images not displayed]

FINDINGS: There is bullous disease in the upper lobes bilaterally.
Interstitial prominence elsewhere in the lungs in part is due to
redistribution of blood flow to viable lung segments. There are
scattered areas of scarring in both mid and lower lung zones. There
is no frank edema or consolidation. The heart size and pulmonary
vascularity are normal. There is stable decreased vascularity in the
upper lobes consistent with the underlying bullous disease. No
adenopathy. No bone lesions.
IMPRESSION: Upper lobe bullous disease. Areas of mild scarring. No edema or
consolidation.

## 2018-12-06 IMAGING — DX DG CHEST 1V
2 series · 2 of 2 positions shown · non-contrast
Comparison: Yesterday

CLINICAL DATA: Increased shortness of breath

EXAM:
CHEST 1 VIEW

[chest ap (1 of 2)]
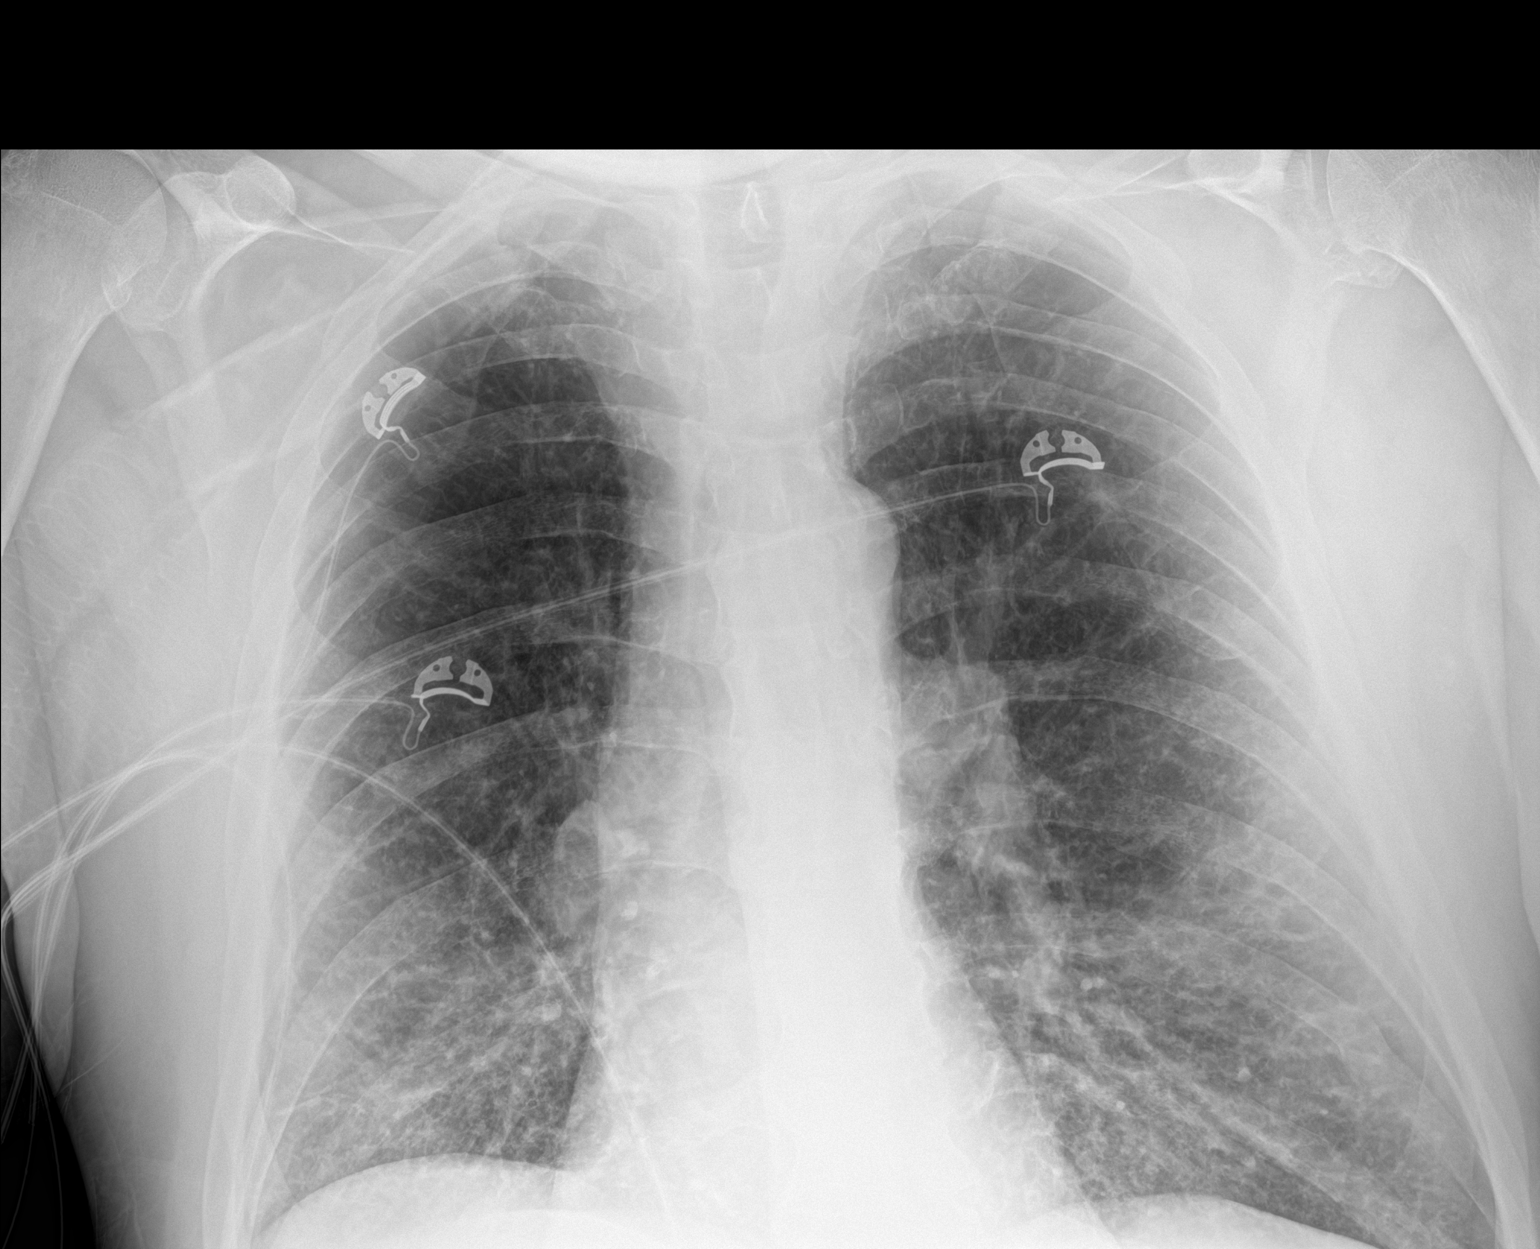

[chest ap (2 of 2)]
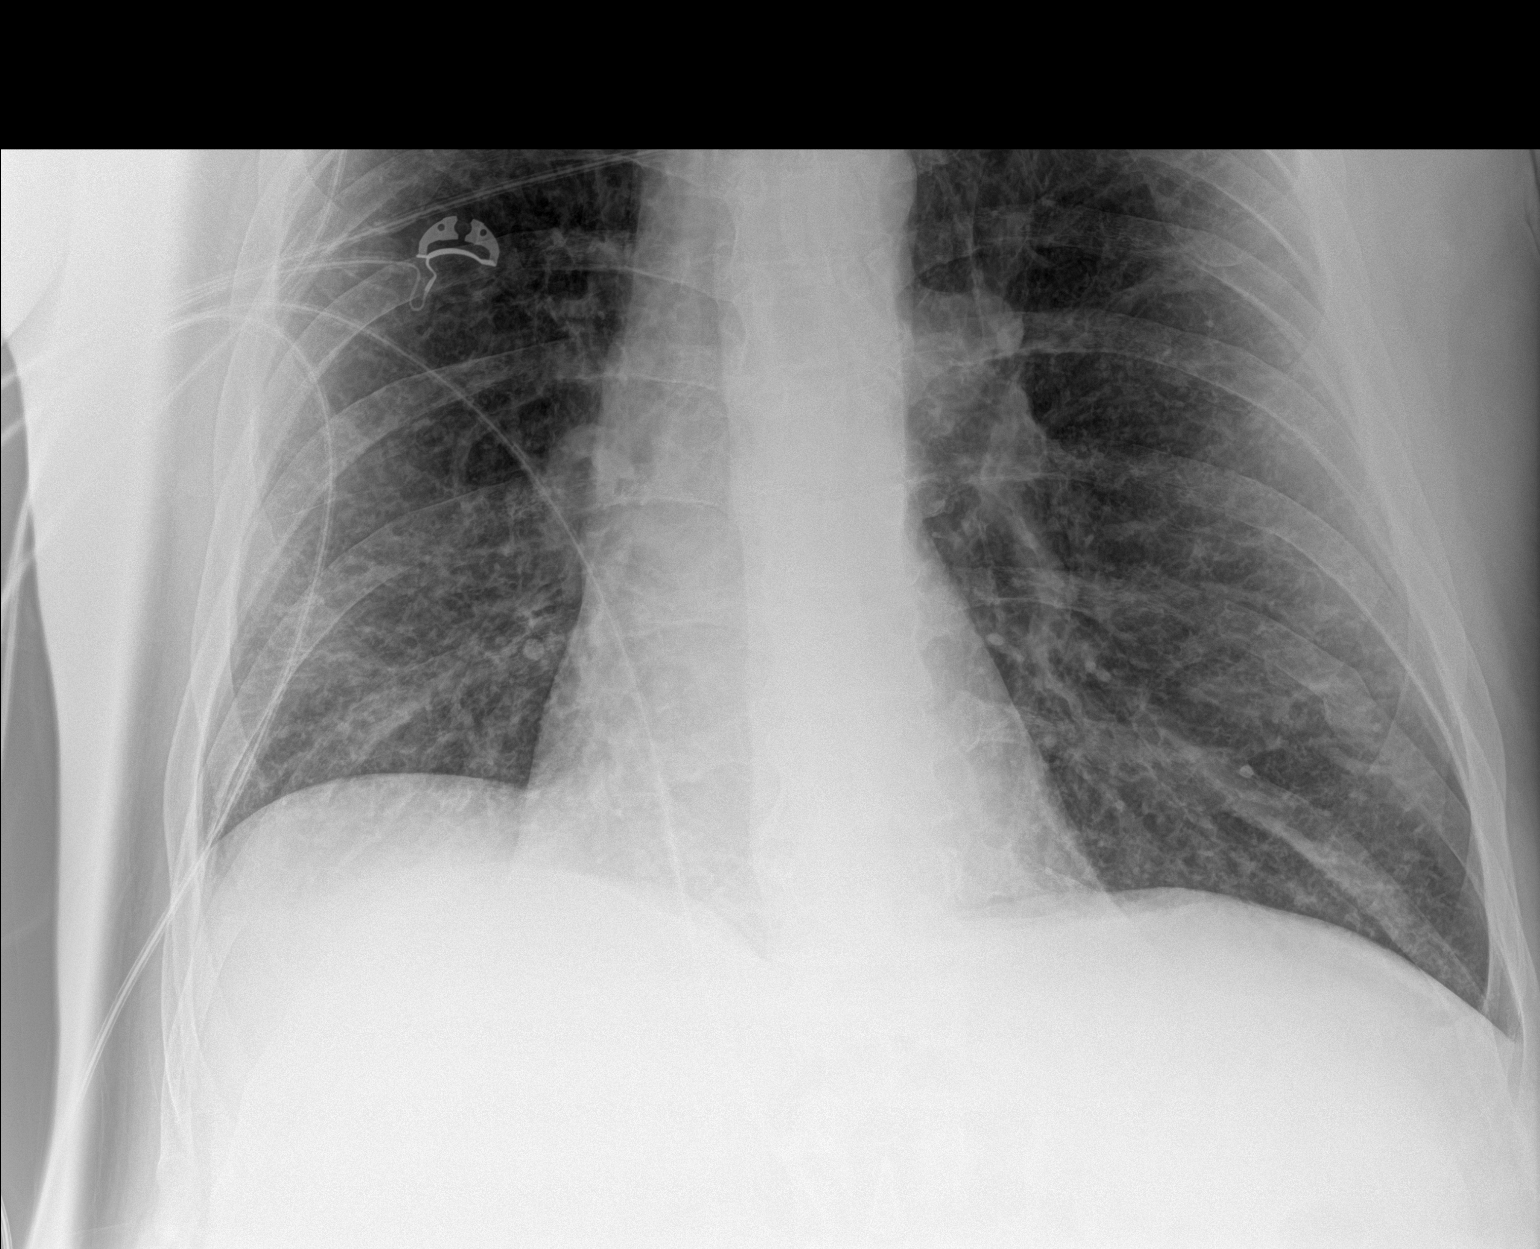

[2 of 2 positions shown; findings below may reference images not displayed]

FINDINGS: COPD with emphysematous changes and generalized interstitial
coarsening. No edema, effusion, or pneumothorax. Negative for
collapse. Normal heart size.
IMPRESSION: COPD without acute superimposed finding.

## 2018-12-07 ENCOUNTER — Other Ambulatory Visit: Payer: Self-pay | Admitting: Neurology

## 2018-12-07 IMAGING — DX DG CHEST 1V PORT
1 series · 1 of 1 positions shown · non-contrast
Comparison: None.

CLINICAL DATA: History of COPD, shortness of breath, hypertension.

EXAM:
PORTABLE CHEST 1 VIEW

[chest ap]
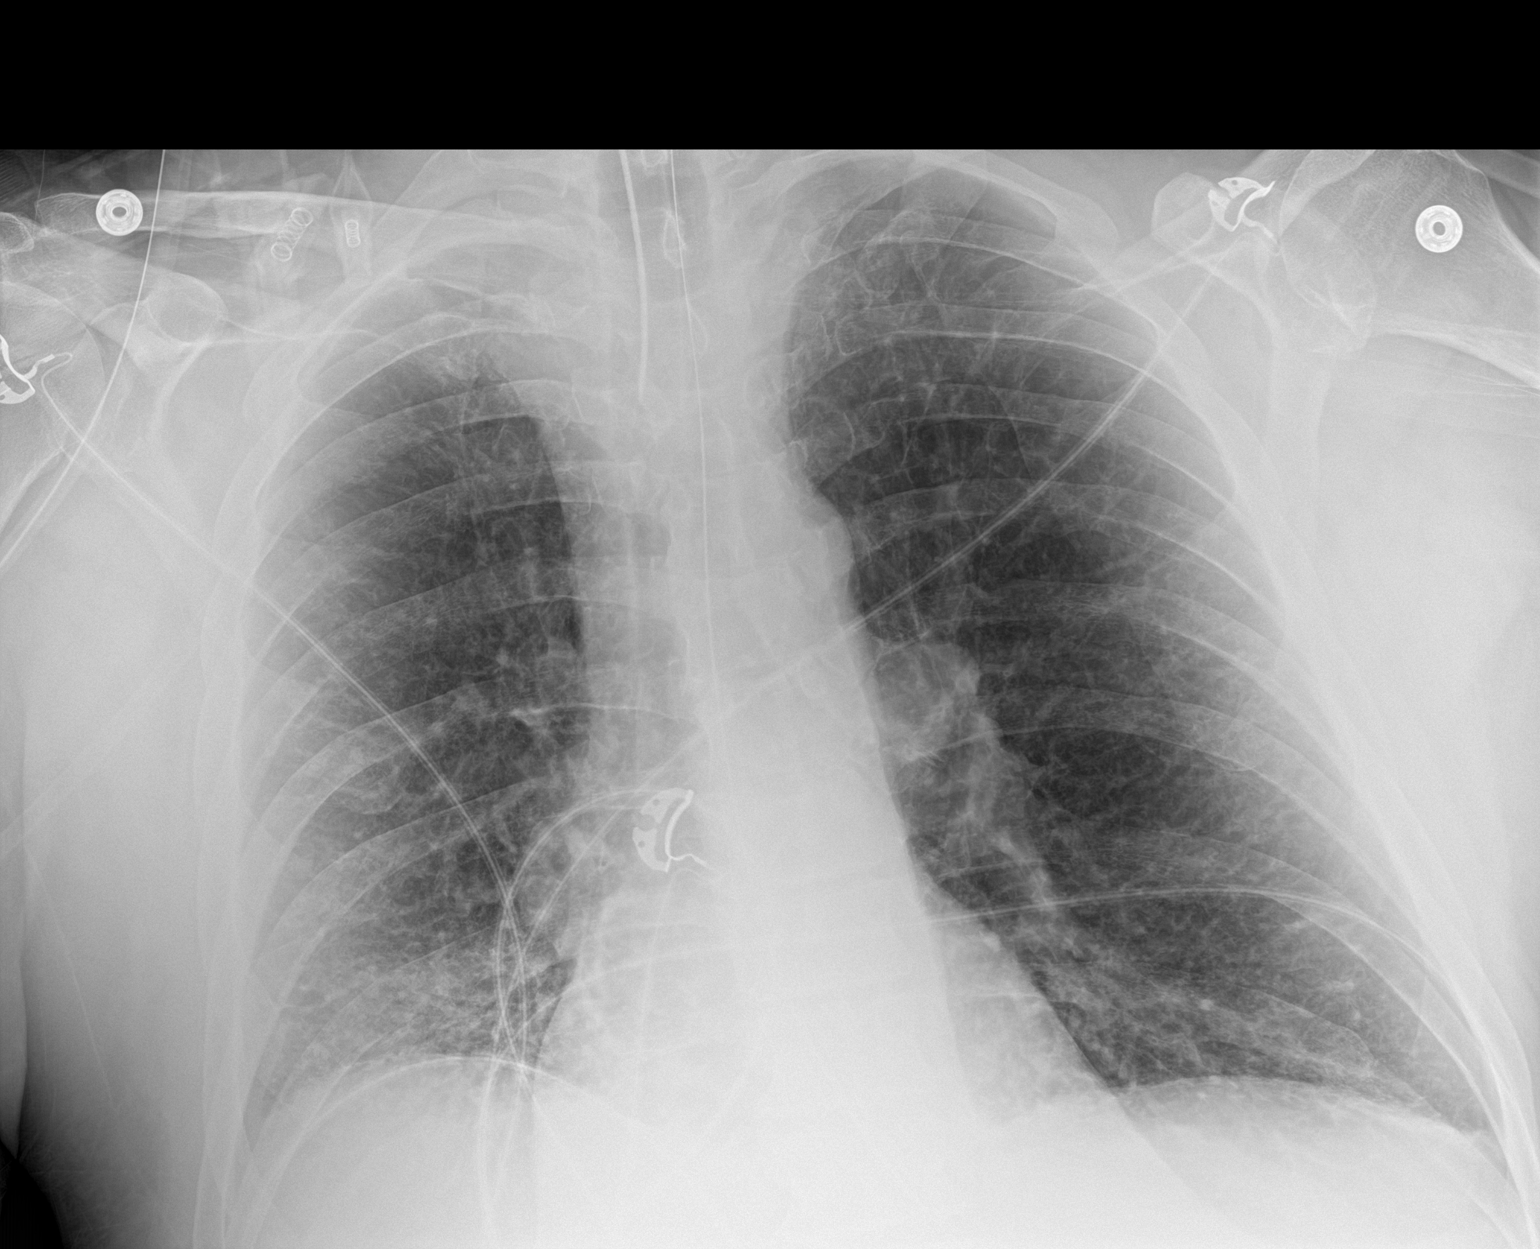

[1 of 1 positions shown; findings below may reference images not displayed]

FINDINGS: Endotracheal tube and NG tube in unchanged position. Heart size
normal. Stable mild basilar atelectasis. Interim partial clearing of
basilar atelectasis/infiltrates and small pleural effusions. No
pneumothorax .
IMPRESSION: 1. Lines and tubes in stable position.

2. Interim partial clearing of basilar atelectasis/ infiltrates and
small pleural effusions .

## 2018-12-23 ENCOUNTER — Encounter (HOSPITAL_COMMUNITY): Payer: Medicare HMO

## 2019-01-15 ENCOUNTER — Encounter: Payer: Self-pay | Admitting: Neurology

## 2019-01-15 ENCOUNTER — Ambulatory Visit: Payer: Medicare HMO | Admitting: Neurology
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
## 2019-01-20 ENCOUNTER — Other Ambulatory Visit: Payer: Self-pay

## 2019-01-20 ENCOUNTER — Telehealth (INDEPENDENT_AMBULATORY_CARE_PROVIDER_SITE_OTHER): Payer: Medicare HMO | Admitting: Neurology

## 2019-01-20 ENCOUNTER — Encounter: Payer: Self-pay | Admitting: Neurology

## 2019-01-20 VITALS — Ht 72.0 in | Wt 238.0 lb

## 2019-01-20 DIAGNOSIS — G40209 Localization-related (focal) (partial) symptomatic epilepsy and epileptic syndromes with complex partial seizures, not intractable, without status epilepticus: Secondary | ICD-10-CM

## 2019-01-20 DIAGNOSIS — M792 Neuralgia and neuritis, unspecified: Secondary | ICD-10-CM

## 2019-01-20 DIAGNOSIS — R4189 Other symptoms and signs involving cognitive functions and awareness: Secondary | ICD-10-CM

## 2019-01-20 DIAGNOSIS — G0490 Encephalitis and encephalomyelitis, unspecified: Secondary | ICD-10-CM

## 2019-01-20 DIAGNOSIS — G4440 Drug-induced headache, not elsewhere classified, not intractable: Secondary | ICD-10-CM

## 2019-01-20 MED ORDER — AMITRIPTYLINE HCL 10 MG PO TABS: 10.0000 mg | ORAL_TABLET | Freq: Every day | ORAL | 3 refills | Status: DC

## 2019-02-05 ENCOUNTER — Telehealth: Payer: Self-pay | Admitting: Neurology

## 2019-02-05 ENCOUNTER — Other Ambulatory Visit: Payer: Self-pay

## 2019-02-05 MED ORDER — AMITRIPTYLINE HCL 25 MG PO TABS: 25.0000 mg | ORAL_TABLET | Freq: Every day | ORAL | 3 refills | Status: DC

## 2019-02-05 NOTE — Telephone Encounter (Signed)
Patient called regarding his new medication Amitriptyline 10 MG. He said that Dosage was not enough, he is still having headaches. He would like it to be increased. He uses [facility] in [place]. Thanks

## 2019-02-05 NOTE — Telephone Encounter (Signed)
Called and advised Harold Bailey Rx was sent in for increased amitriptyline 25 mg

## 2019-02-16 DIAGNOSIS — H9202 Otalgia, left ear: Secondary | ICD-10-CM | POA: Insufficient documentation

## 2019-03-03 ENCOUNTER — Ambulatory Visit (INDEPENDENT_AMBULATORY_CARE_PROVIDER_SITE_OTHER): Payer: Medicare HMO | Admitting: Gastroenterology

## 2019-03-03 ENCOUNTER — Other Ambulatory Visit: Payer: Self-pay

## 2019-03-03 ENCOUNTER — Encounter: Payer: Self-pay | Admitting: Gastroenterology

## 2019-03-03 VITALS — Ht 72.0 in | Wt 255.0 lb

## 2019-03-03 DIAGNOSIS — R195 Other fecal abnormalities: Secondary | ICD-10-CM | POA: Diagnosis not present

## 2019-03-03 NOTE — Progress Notes (Deleted)
Virtual Visit via Video Note  I connected with Harold Bailey on 03/03/19 at  4:15 PM EDT by a video enabled telemedicine application and verified that I am speaking with the correct person using two identifiers.  Location: Patient: Home Provider: Office - [facility] - [address], [address], [place], [place] [address]  I discussed the limitations of evaluation and management by telemedicine and the availability of in person appointments. The patient expressed understanding and agreed to proceed. I also discussed with the patient that there may be a patient responsible charge related to this service. The patient expressed understanding and agreed to proceed.  Patient consented to consult via telephone: Yes People present and their role in pt care: Pt   History of Present Illness:  53 year old current everyday smoker  Maintenance: Trelegy Ellipta   Pt of Dr. [name]  Chief complaint:     Observations/Objective:   53 year old smoker for FU of COPD & chronic hypoxic resp failure.  04/2017 Hosp  for 2 months for chronic respiratory failure, MRSA pneumonia, C. difficile colitis and tracheostomy due to prolonged mechanical ventilation.  He was discharged on oxygen about 4 L at daytime and 2 L during sleep.   PMH -substance abuse and was in [place] for rehab and went through withdrawal of pain medications in 09/2017.  He is currently maintained on Xanax 1 mg thrice daily.  BIpolar &  seizure disorder on 2 anticonvulsants  He used to smoke about 2 to 4 packs/day but now has cut down to 1 packs/day   On his last visit 09/2018, we gave him a sample of Anoro.  This really helped him but he never called back for prescription.  He would like to do away with his budesonide nebs too Regarding albuterol, he feels that the Ventolin did not help him as much.  He took Proventil inhaler from his girlfriend and that seems to work better for him.  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  He also inquires about wood stove in his basement where he goes down to smoke  Oxygen saturation is 90% today.  He does not use oxygen in the daytime much but is compliant with this during sleep   Significant tests/ events reviewed  Chest x-ray 06/12/2017 and 07/12/2016 - hyperinflated lungs Spirometry 09/2018 severe airway obstruction with ratio of 67, FEV1 of 39% FVC of 45%.   Assessment and Plan:  No problem-specific Assessment & Plan notes found for this encounter.   Follow Up Instructions:  No follow-ups on file.    I discussed the assessment and treatment plan with the patient. The patient was provided an opportunity to ask questions and all were answered. The patient agreed with the plan and demonstrated an understanding of the instructions.   The patient was advised to call back or seek an in-person evaluation if the symptoms worsen or if the condition fails to improve as anticipated.  I provided *** minutes of non-face-to-face time during this encounter.   Lauraine Rinne, NP

## 2019-03-03 NOTE — H&P (View-Only) (Signed)
Harold Bailey    [id number]    20-Nov-1965  Primary Care Physician:Millsaps, Joelene Millin, NP  Referring Physician: Everardo Beals, NP [place], [place] [address]  This service was provided via audio only telemedicine (Doximity) due to [place] 19 pandemic.  Patient location: Home Provider location: Office Used 2 patient identifiers to confirm the correct person. Explained the limitations in evaluation and management via telemedicine. Patient is aware of potential medical charges for this visit.  Patient consented to this virtual visit.  The persons participating in this telemedicine service were myself, the patient and his wife  Interactive audio and video telecommunications were attempted between this provider and patient, however failed, due to patient having technical difficulties OR patient did not have access to video capability. We continued and completed visit with audio only.  Chief complaint: Positive Cologuard  HPI:  53 year old male with multiple comorbidities including severe COPD on home oxygen, seizure disorder, diabetes, GERD is here to discuss colonoscopy Cologuard test is positive Denies any melena or overt bright red blood per rectum No abdominal pain, vomiting, decreased appetite or weight loss. Family history of colon cancer.  Outpatient Encounter Medications as of 03/03/2019  Medication Sig  . albuterol (PROVENTIL HFA;VENTOLIN HFA) 108 (90 Base) MCG/ACT inhaler Inhale 2 puffs into the lungs every 6 (six) hours as needed for wheezing or shortness of breath.  Marland Kitchen albuterol (PROVENTIL) (2.5 MG/3ML) 0.083% nebulizer solution Take 3 mLs (2.5 mg total) by nebulization every 6 (six) hours.  . Alfalfa 600 MG TABS Take 600 mg by mouth daily.  Marland Kitchen ALPRAZolam (XANAX) 1 MG tablet Take 1 mg by mouth daily as needed.  Marland Kitchen amitriptyline (ELAVIL) 25 MG tablet Take 1 tablet (25 mg total) by mouth at bedtime.  Marland Kitchen aspirin 81 MG chewable tablet Place 1  tablet (81 mg total) into feeding tube daily.  . divalproex (DEPAKOTE ER) 500 MG 24 hr tablet TAKE 2 TABLETS(1000 MG) BY MOUTH DAILY  . fluticasone furoate-vilanterol (BREO ELLIPTA) 100-25 MCG/INH AEPB Breo Ellipta 200 mcg-25 mcg/dose powder for inhalation  INHALE 1 PUFF BY MOUTH EVERY DAY  . Fluticasone-Umeclidin-Vilant (TRELEGY ELLIPTA) 100-62.5-25 MCG/INH AEPB Inhale 1 puff into the lungs daily.  Marland Kitchen gabapentin (NEURONTIN) 300 MG capsule TAKE 1 CAPSULE(300 MG) BY MOUTH TWICE DAILY  . ibuprofen (ADVIL,MOTRIN) 800 MG tablet TAKE 1 TABLET(800 MG) BY MOUTH EVERY 8 HOURS AS NEEDED  . meloxicam (MOBIC) 7.5 MG tablet Take 7.5 mg by mouth daily.  . Multiple Vitamins-Minerals (MULTIVITAMIN WITH MINERALS) tablet Take 1 tablet by mouth daily.  Marland Kitchen omeprazole (PRILOSEC) 40 MG capsule Take 40 mg by mouth daily. As needed  . Oxcarbazepine (TRILEPTAL) 300 MG tablet TAKE 1 TABLET(300 MG) BY MOUTH TWICE DAILY  . OXYGEN Inhale into the lungs. At home oxygen  . rosuvastatin (CRESTOR) 10 MG tablet Take 10 mg by mouth daily.  Marland Kitchen UNABLE TO FIND Take 1,200 mg by mouth. Med Name: Lecithin  . VOLTAREN 1 % GEL APPLY 2 TO 4 GRAMS EXTERNALLY TO THE AFFECTED AREA TWICE DAILY  . [DISCONTINUED] amitriptyline (ELAVIL) 10 MG tablet Take 1 tablet (10 mg total) by mouth at bedtime. (Patient not taking: Reported on 03/03/2019)   No facility-administered encounter medications on file as of 03/03/2019.     Allergies as of 03/03/2019 - Review Complete 01/20/2019  Allergen Reaction Noted  . Hydrocodone Rash 07/29/2015  . Lyrica [pregabalin] Swelling 05/08/2018  . No known allergies  10/01/2016  . Other  05/06/2017    Past Medical History:  Diagnosis Date  . Anxiety   . Arthritis    Left ankle  . COPD (chronic obstructive pulmonary disease) ([place])   . COPD (chronic obstructive pulmonary disease) ([place])   . Drug abuse ([place])   . Gait disturbance   . GERD (gastroesophageal reflux disease)   . 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  Marland Kitchen History of ankle surgery   . Hyperlipidemia   . Hypertension   . 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 Memory loss   . On home oxygen therapy 08/26/2018  . Peripheral vision loss    Left due to brain surgery  . Pneumonia   . Polycythemia   . Prediabetes   . Seizures ([place]) 11/19/2013   none since 2016  . Shortness of breath    Hx of smoking.    Past Surgical History:  Procedure Laterality Date  . ABDOMINAL AORTOGRAM W/LOWER EXTREMITY N/A 05/30/2017   Procedure: ABDOMINAL AORTOGRAM W/LOWER EXTREMITY;  Surgeon: Elam Dutch, MD;  Location: [facility];  Service: Cardiovascular;  Laterality: N/A;  . ANKLE ARTHROSCOPY  01/17/2012   Procedure: ANKLE ARTHROSCOPY;  Surgeon: [name], MD;  Location: [facility];  Service: Orthopedics;  Laterality: Left;  . ANKLE ARTHROSCOPY WITH FUSION Left 10/02/2016   Procedure: Left Posterior Subtalar Arthrodesis Arthroscopy;  Surgeon: [name], MD;  Location: [facility];  Service: Orthopedics;  Laterality: Left;  . ANKLE FUSION Left 11/2012   Dr Sharol Given  . ANKLE SURGERY  2003   Left  . BRAIN SURGERY  2009   Biopsy  . FRACTURE SURGERY  2003   Left ankle  . HARDWARE REMOVAL Left 12/08/2012   Procedure: HARDWARE REMOVAL;  Surgeon: [name], MD;  Location: [facility];  Service: Orthopedics;  Laterality: Left;  Removal Deep Hardware, Take Down Non-Union, Revision Internal Fixation Left Ankle  . I&D EXTREMITY Left 01/01/2013   Procedure: IRRIGATION AND DEBRIDEMENT EXTREMITY;  Surgeon: [name], MD;  Location: [facility];  Service: Orthopedics;  Laterality: Left;  Irrigation, Debridement and Placement Antibiotic Beads Left Ankle  . INNER EAR SURGERY    . OPEN REDUCTION INTERNAL FIXATION (ORIF) SCAPHOID WITH DISTAL RADIUS GRAFT Right 02/01/2013   Procedure: OPEN REDUCTION INTERNAL FIXATION (ORIF) RIGHT SCAPHOID FRACTURE WITH DISTAL RADIUS GRAFT;  Surgeon: Schuyler Amor,  MD;  Location: [facility];  Service: Orthopedics;  Laterality: Right;  . ORIF ANKLE FRACTURE Left 12/08/2012   Procedure: OPEN REDUCTION INTERNAL FIXATION (ORIF) ANKLE FRACTURE;  Surgeon: [name], MD;  Location: [facility];  Service: Orthopedics;  Laterality: Left;  Removal Deep Hardware, Take Down Non-Union, Revision Internal Fixation Left Ankle  . ORIF ANKLE FRACTURE Right 09/03/2013   Procedure: OPEN REDUCTION INTERNAL FIXATION (ORIF) ANKLE FRACTURE;  Surgeon: [name], MD;  Location: [facility];  Service: Orthopedics;  Laterality: Right;  Open Reduction Internal Fixation Right Fibula  . SCAPHOID FRACTURE SURGERY  02/04/2013  . SHOULDER ARTHROSCOPY  2008   Left  . TYMPANOSTOMY TUBE PLACEMENT  1993    Family History  Problem Relation Age of Onset  . Hypertension Mother   . Bronchitis Mother   . Cancer Maternal Aunt   . Cancer Maternal Grandmother   . Stroke Neg Hx   . Diabetes Neg Hx     Social History   Socioeconomic History  . Marital status: Married  Spouse name: Vickie  . Number of children: 1  . Years of education: 90  . Highest education level: Not on file  Occupational History    Employer: NOT EMPLOYED    Comment: Disabled  Social Needs  . Financial resource strain: Not on file  . Food insecurity:    Worry: Not on file    Inability: Not on file  . Transportation needs:    Medical: Not on file    Non-medical: Not on file  Tobacco Use  . Smoking status: Current Every Day Smoker    Packs/day: 0.25    Years: 30.00    Pack years: 7.50    Types: Cigarettes  . Smokeless tobacco: Never Used  Substance and Sexual Activity  . Alcohol use: No    Alcohol/week: 0.0 standard drinks    Comment: quit Oct. 2017(heavy drinker) .In [place]  . Drug use: No  . Sexual activity: Not Currently  Lifestyle  . Physical activity:    Days per week: Not on file    Minutes per session: Not on file  . Stress: Not on file  Relationships  . Social connections:    Talks on phone: Not on file     Gets together: Not on file    Attends religious service: Not on file    Active member of club or organization: Not on file    Attends meetings of clubs or organizations: Not on file    Relationship status: Not on file  . Intimate partner violence:    Fear of current or ex partner: Not on file    Emotionally abused: Not on file    Physically abused: Not on file    Forced sexual activity: Not on file  Other Topics Concern  . Not on file  Social History Narrative   Patient lives at home with his wife Maxwell Caul)    Disabled   Left handed   Education 12 th    Caffeine Coffee four cups      Review of systems: Review of Systems as per HPI All other systems reviewed and are negative.   Physical Exam: Vitals were not taken and physical exam was not performed during this virtual visit.  Data Reviewed:  Reviewed labs, radiology imaging, old records and pertinent past GI work up   Assessment and Plan/Recommendations:  53 year old with multiple comorbidities including diabetes, GERD, drug abuse, history of seizure disorder and severe COPD on home oxygen Positive Cologuard, will proceed with colonoscopy for further evaluation Need to schedule the procedure at Hospital endoscopy unit due to his comorbidities  The risks and benefits as well as alternatives of endoscopic procedure(s) have been discussed and reviewed. All questions answered. The patient agrees to proceed. Avoid NSAID's prior to the procedure    K. Denzil Magnuson , MD   CC: Everardo Beals, NP

## 2019-03-03 NOTE — Patient Instructions (Addendum)
Please schedule colonoscopy at the hospital ( We will contact you with this appointment)  June 18 th  at 2pm at [facility]   Avoid NSAIDs at least 1-2 weeks prior to that procedure  I appreciate the  opportunity to care for you  Thank You   Harl Bowie , MD

## 2019-03-03 NOTE — Progress Notes (Signed)
Harold Bailey    [id number]    04-09-1966  Primary Care Physician:Millsaps, Joelene Millin, NP  Referring Physician: Everardo Beals, NP [place] [place], [place] [address]  This service was provided via audio only telemedicine (Doximity) due to [place] 19 pandemic.  Patient location: Home Provider location: Office Used 2 patient identifiers to confirm the correct person. Explained the limitations in evaluation and management via telemedicine. Patient is aware of potential medical charges for this visit.  Patient consented to this virtual visit.  The persons participating in this telemedicine service were myself, the patient and his wife  Interactive audio and video telecommunications were attempted between this provider and patient, however failed, due to patient having technical difficulties OR patient did not have access to video capability. We continued and completed visit with audio only.  Chief complaint: Positive Cologuard  HPI:  53 year old male with multiple comorbidities including severe COPD on home oxygen, seizure disorder, diabetes, GERD is here to discuss colonoscopy Cologuard test is positive Denies any melena or overt bright red blood per rectum No abdominal pain, vomiting, decreased appetite or weight loss. Family history of colon cancer.  Outpatient Encounter Medications as of 03/03/2019  Medication Sig  . albuterol (PROVENTIL HFA;VENTOLIN HFA) 108 (90 Base) MCG/ACT inhaler Inhale 2 puffs into the lungs every 6 (six) hours as needed for wheezing or shortness of breath.  Marland Kitchen albuterol (PROVENTIL) (2.5 MG/3ML) 0.083% nebulizer solution Take 3 mLs (2.5 mg total) by nebulization every 6 (six) hours.  . Alfalfa 600 MG TABS Take 600 mg by mouth daily.  Marland Kitchen ALPRAZolam (XANAX) 1 MG tablet Take 1 mg by mouth daily as needed.  Marland Kitchen amitriptyline (ELAVIL) 25 MG tablet Take 1 tablet (25 mg total) by mouth at bedtime.  Marland Kitchen aspirin 81 MG chewable tablet Place 1  tablet (81 mg total) into feeding tube daily.  . divalproex (DEPAKOTE ER) 500 MG 24 hr tablet TAKE 2 TABLETS(1000 MG) BY MOUTH DAILY  . fluticasone furoate-vilanterol (BREO ELLIPTA) 100-25 MCG/INH AEPB Breo Ellipta 200 mcg-25 mcg/dose powder for inhalation  INHALE 1 PUFF BY MOUTH EVERY DAY  . Fluticasone-Umeclidin-Vilant (TRELEGY ELLIPTA) 100-62.5-25 MCG/INH AEPB Inhale 1 puff into the lungs daily.  Marland Kitchen gabapentin (NEURONTIN) 300 MG capsule TAKE 1 CAPSULE(300 MG) BY MOUTH TWICE DAILY  . ibuprofen (ADVIL,MOTRIN) 800 MG tablet TAKE 1 TABLET(800 MG) BY MOUTH EVERY 8 HOURS AS NEEDED  . meloxicam (MOBIC) 7.5 MG tablet Take 7.5 mg by mouth daily.  . Multiple Vitamins-Minerals (MULTIVITAMIN WITH MINERALS) tablet Take 1 tablet by mouth daily.  Marland Kitchen omeprazole (PRILOSEC) 40 MG capsule Take 40 mg by mouth daily. As needed  . Oxcarbazepine (TRILEPTAL) 300 MG tablet TAKE 1 TABLET(300 MG) BY MOUTH TWICE DAILY  . OXYGEN Inhale into the lungs. At home oxygen  . rosuvastatin (CRESTOR) 10 MG tablet Take 10 mg by mouth daily.  Marland Kitchen UNABLE TO FIND Take 1,200 mg by mouth. Med Name: Lecithin  . VOLTAREN 1 % GEL APPLY 2 TO 4 GRAMS EXTERNALLY TO THE AFFECTED AREA TWICE DAILY  . [DISCONTINUED] amitriptyline (ELAVIL) 10 MG tablet Take 1 tablet (10 mg total) by mouth at bedtime. (Patient not taking: Reported on 03/03/2019)   No facility-administered encounter medications on file as of 03/03/2019.     Allergies as of 03/03/2019 - Review Complete 01/20/2019  Allergen Reaction Noted  . Hydrocodone Rash 07/29/2015  . Lyrica [pregabalin] Swelling 05/08/2018  . No known allergies  10/01/2016  . Other  05/06/2017    Past Medical History:  Diagnosis Date  . Anxiety   . Arthritis    Left ankle  . COPD (chronic obstructive pulmonary disease) ([place])   . COPD (chronic obstructive pulmonary disease) ([place])   . Drug abuse ([place])   . Gait disturbance   . GERD (gastroesophageal reflux disease)   . 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  Marland Kitchen History of ankle surgery   . Hyperlipidemia   . Hypertension   . 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 Memory loss   . On home oxygen therapy 08/26/2018  . Peripheral vision loss    Left due to brain surgery  . Pneumonia   . Polycythemia   . Prediabetes   . Seizures ([place]) 11/19/2013   none since 2016  . Shortness of breath    Hx of smoking.    Past Surgical History:  Procedure Laterality Date  . ABDOMINAL AORTOGRAM W/LOWER EXTREMITY N/A 05/30/2017   Procedure: ABDOMINAL AORTOGRAM W/LOWER EXTREMITY;  Surgeon: Elam Dutch, MD;  Location: [facility];  Service: Cardiovascular;  Laterality: N/A;  . ANKLE ARTHROSCOPY  01/17/2012   Procedure: ANKLE ARTHROSCOPY;  Surgeon: [name], MD;  Location: [facility];  Service: Orthopedics;  Laterality: Left;  . ANKLE ARTHROSCOPY WITH FUSION Left 10/02/2016   Procedure: Left Posterior Subtalar Arthrodesis Arthroscopy;  Surgeon: [name], MD;  Location: [facility];  Service: Orthopedics;  Laterality: Left;  . ANKLE FUSION Left 11/2012   Dr Sharol Given  . ANKLE SURGERY  2003   Left  . BRAIN SURGERY  2009   Biopsy  . FRACTURE SURGERY  2003   Left ankle  . HARDWARE REMOVAL Left 12/08/2012   Procedure: HARDWARE REMOVAL;  Surgeon: [name], MD;  Location: [facility];  Service: Orthopedics;  Laterality: Left;  Removal Deep Hardware, Take Down Non-Union, Revision Internal Fixation Left Ankle  . I&D EXTREMITY Left 01/01/2013   Procedure: IRRIGATION AND DEBRIDEMENT EXTREMITY;  Surgeon: [name], MD;  Location: [facility];  Service: Orthopedics;  Laterality: Left;  Irrigation, Debridement and Placement Antibiotic Beads Left Ankle  . INNER EAR SURGERY    . OPEN REDUCTION INTERNAL FIXATION (ORIF) SCAPHOID WITH DISTAL RADIUS GRAFT Right 02/01/2013   Procedure: OPEN REDUCTION INTERNAL FIXATION (ORIF) RIGHT SCAPHOID FRACTURE WITH DISTAL RADIUS GRAFT;  Surgeon: Schuyler Amor,  MD;  Location: [facility];  Service: Orthopedics;  Laterality: Right;  . ORIF ANKLE FRACTURE Left 12/08/2012   Procedure: OPEN REDUCTION INTERNAL FIXATION (ORIF) ANKLE FRACTURE;  Surgeon: [name], MD;  Location: [facility];  Service: Orthopedics;  Laterality: Left;  Removal Deep Hardware, Take Down Non-Union, Revision Internal Fixation Left Ankle  . ORIF ANKLE FRACTURE Right 09/03/2013   Procedure: OPEN REDUCTION INTERNAL FIXATION (ORIF) ANKLE FRACTURE;  Surgeon: [name], MD;  Location: [facility];  Service: Orthopedics;  Laterality: Right;  Open Reduction Internal Fixation Right Fibula  . SCAPHOID FRACTURE SURGERY  02/04/2013  . SHOULDER ARTHROSCOPY  2008   Left  . TYMPANOSTOMY TUBE PLACEMENT  1993    Family History  Problem Relation Age of Onset  . Hypertension Mother   . Bronchitis Mother   . Cancer Maternal Aunt   . Cancer Maternal Grandmother   . Stroke Neg Hx   . Diabetes Neg Hx     Social History   Socioeconomic History  . Marital status: Married  Spouse name: Vickie  . Number of children: 1  . Years of education: 65  . Highest education level: Not on file  Occupational History    Employer: NOT EMPLOYED    Comment: Disabled  Social Needs  . Financial resource strain: Not on file  . Food insecurity:    Worry: Not on file    Inability: Not on file  . Transportation needs:    Medical: Not on file    Non-medical: Not on file  Tobacco Use  . Smoking status: Current Every Day Smoker    Packs/day: 0.25    Years: 30.00    Pack years: 7.50    Types: Cigarettes  . Smokeless tobacco: Never Used  Substance and Sexual Activity  . Alcohol use: No    Alcohol/week: 0.0 standard drinks    Comment: quit Oct. 2017(heavy drinker) .In [place]  . Drug use: No  . Sexual activity: Not Currently  Lifestyle  . Physical activity:    Days per week: Not on file    Minutes per session: Not on file  . Stress: Not on file  Relationships  . Social connections:    Talks on phone: Not on file     Gets together: Not on file    Attends religious service: Not on file    Active member of club or organization: Not on file    Attends meetings of clubs or organizations: Not on file    Relationship status: Not on file  . Intimate partner violence:    Fear of current or ex partner: Not on file    Emotionally abused: Not on file    Physically abused: Not on file    Forced sexual activity: Not on file  Other Topics Concern  . Not on file  Social History Narrative   Patient lives at home with his wife Maxwell Caul)    Disabled   Left handed   Education 12 th    Caffeine Coffee four cups      Review of systems: Review of Systems as per HPI All other systems reviewed and are negative.   Physical Exam: Vitals were not taken and physical exam was not performed during this virtual visit.  Data Reviewed:  Reviewed labs, radiology imaging, old records and pertinent past GI work up   Assessment and Plan/Recommendations:  53 year old with multiple comorbidities including diabetes, GERD, drug abuse, history of seizure disorder and severe COPD on home oxygen Positive Cologuard, will proceed with colonoscopy for further evaluation Need to schedule the procedure at Hospital endoscopy unit due to his comorbidities  The risks and benefits as well as alternatives of endoscopic procedure(s) have been discussed and reviewed. All questions answered. The patient agrees to proceed. Avoid NSAID's prior to the procedure    K. Denzil Magnuson , MD   CC: Everardo Beals, NP

## 2019-03-04 ENCOUNTER — Other Ambulatory Visit: Payer: Self-pay

## 2019-03-04 ENCOUNTER — Telehealth: Payer: Self-pay | Admitting: *Deleted

## 2019-03-04 ENCOUNTER — Other Ambulatory Visit: Payer: Self-pay | Admitting: *Deleted

## 2019-03-04 ENCOUNTER — Telehealth: Payer: Medicare HMO | Admitting: Pulmonary Disease

## 2019-03-04 DIAGNOSIS — Z1211 Encounter for screening for malignant neoplasm of colon: Secondary | ICD-10-CM

## 2019-03-04 MED ORDER — PLENVU 140 G PO SOLR: 1.0000 | Freq: Once | ORAL | 0 refills | Status: AC

## 2019-03-04 MED ORDER — SUPREP BOWEL PREP KIT 17.5-3.13-1.6 GM/177ML PO SOLN: 1.0000 | Freq: Once | ORAL | 0 refills | Status: AC

## 2019-03-04 NOTE — Telephone Encounter (Signed)
Patient will go to [facility] tomorrow on the 12th for his [place] screening Appointment changed to [facility] to verify they could see the orders and informed patient

## 2019-03-04 NOTE — Telephone Encounter (Signed)
Patient wants to go to [facility] to have his COVID testing done. Per [facility] Setters he should be able to go there to get it done. Patient will call and confirm the testing sight   Mailed off colonoscopy instructions today

## 2019-03-04 NOTE — Progress Notes (Signed)
suprep   Sent suprep instructions and suprep   Insurance would not cover Plenvu

## 2019-03-05 ENCOUNTER — Encounter: Payer: Self-pay | Admitting: Gastroenterology

## 2019-03-05 ENCOUNTER — Other Ambulatory Visit (HOSPITAL_COMMUNITY)
Admission: RE | Admit: 2019-03-05 | Discharge: 2019-03-05 | Disposition: A | Discharge status: Home / Self Care | Payer: Medicare HMO | Source: Ambulatory Visit | Attending: Gastroenterology | Admitting: Gastroenterology

## 2019-03-05 ENCOUNTER — Other Ambulatory Visit: Payer: Self-pay

## 2019-03-05 DIAGNOSIS — Z1159 Encounter for screening for other viral diseases: Secondary | ICD-10-CM | POA: Diagnosis not present

## 2019-03-05 DIAGNOSIS — Z01812 Encounter for preprocedural laboratory examination: Secondary | ICD-10-CM | POA: Insufficient documentation

## 2019-03-06 LAB — NOVEL CORONAVIRUS, NAA (HOSP ORDER, SEND-OUT TO REF LAB; TAT 18-24 HRS): SARS-CoV-2, NAA: NOT DETECTED

## 2019-03-08 ENCOUNTER — Other Ambulatory Visit: Payer: Self-pay

## 2019-03-08 ENCOUNTER — Other Ambulatory Visit (HOSPITAL_COMMUNITY): Payer: Medicare HMO

## 2019-03-08 ENCOUNTER — Other Ambulatory Visit (HOSPITAL_COMMUNITY)
Admission: RE | Admit: 2019-03-08 | Discharge: 2019-03-08 | Disposition: A | Discharge status: Home / Self Care | Payer: Medicare HMO | Source: Ambulatory Visit | Attending: Gastroenterology | Admitting: Gastroenterology

## 2019-03-10 ENCOUNTER — Encounter (HOSPITAL_COMMUNITY): Payer: Self-pay

## 2019-03-10 ENCOUNTER — Other Ambulatory Visit: Payer: Self-pay

## 2019-03-10 NOTE — Progress Notes (Signed)
SPOKE W/  Harold Bailey     SCREENING SYMPTOMS OF COVID 19:   COUGH--NO  RUNNY NOSE---NO   SORE THROAT---NO  NASAL CONGESTION----NO  SNEEZING----NO  SHORTNESS OF BREATH---NO  DIFFICULTY BREATHING---NO  TEMP >100.0 -----NO  UNEXPLAINED BODY ACHES------NO  CHILLS -------- NO  HEADACHES ---------NO  LOSS OF SMELL/ TASTE --------NO    HAVE YOU OR ANY FAMILY MEMBER TRAVELLED PAST 14 DAYS OUT OF THE   COUNTY---lives in [place] STATE----NO COUNTRY----NO  HAVE YOU OR ANY FAMILY MEMBER BEEN EXPOSED TO ANYONE WITH COVID 19? NO

## 2019-03-11 ENCOUNTER — Encounter (HOSPITAL_COMMUNITY)
Admission: RE | Disposition: A | Discharge status: Home / Self Care | Payer: Self-pay | Source: Home / Self Care | Attending: Gastroenterology

## 2019-03-11 ENCOUNTER — Ambulatory Visit (HOSPITAL_COMMUNITY): Payer: Medicare HMO | Admitting: Anesthesiology

## 2019-03-11 ENCOUNTER — Other Ambulatory Visit: Payer: Self-pay

## 2019-03-11 ENCOUNTER — Ambulatory Visit (HOSPITAL_COMMUNITY)
Admission: RE | Admit: 2019-03-11 | Discharge: 2019-03-11 | Disposition: A | Discharge status: Home / Self Care | Payer: Medicare HMO | Attending: Gastroenterology | Admitting: Gastroenterology

## 2019-03-11 ENCOUNTER — Encounter (HOSPITAL_COMMUNITY): Payer: Self-pay | Admitting: Emergency Medicine

## 2019-03-11 DIAGNOSIS — Z791 Long term (current) use of non-steroidal anti-inflammatories (NSAID): Secondary | ICD-10-CM | POA: Diagnosis not present

## 2019-03-11 DIAGNOSIS — E119 Type 2 diabetes mellitus without complications: Secondary | ICD-10-CM | POA: Insufficient documentation

## 2019-03-11 DIAGNOSIS — E785 Hyperlipidemia, unspecified: Secondary | ICD-10-CM | POA: Diagnosis not present

## 2019-03-11 DIAGNOSIS — G40909 Epilepsy, unspecified, not intractable, without status epilepticus: Secondary | ICD-10-CM | POA: Insufficient documentation

## 2019-03-11 DIAGNOSIS — K648 Other hemorrhoids: Secondary | ICD-10-CM | POA: Insufficient documentation

## 2019-03-11 DIAGNOSIS — K625 Hemorrhage of anus and rectum: Secondary | ICD-10-CM

## 2019-03-11 DIAGNOSIS — K219 Gastro-esophageal reflux disease without esophagitis: Secondary | ICD-10-CM | POA: Diagnosis not present

## 2019-03-11 DIAGNOSIS — F419 Anxiety disorder, unspecified: Secondary | ICD-10-CM | POA: Insufficient documentation

## 2019-03-11 DIAGNOSIS — I10 Essential (primary) hypertension: Secondary | ICD-10-CM | POA: Insufficient documentation

## 2019-03-11 DIAGNOSIS — D122 Benign neoplasm of ascending colon: Secondary | ICD-10-CM | POA: Diagnosis not present

## 2019-03-11 DIAGNOSIS — Z79899 Other long term (current) drug therapy: Secondary | ICD-10-CM | POA: Insufficient documentation

## 2019-03-11 DIAGNOSIS — Z9981 Dependence on supplemental oxygen: Secondary | ICD-10-CM | POA: Diagnosis not present

## 2019-03-11 DIAGNOSIS — F17210 Nicotine dependence, cigarettes, uncomplicated: Secondary | ICD-10-CM | POA: Diagnosis not present

## 2019-03-11 DIAGNOSIS — R195 Other fecal abnormalities: Secondary | ICD-10-CM

## 2019-03-11 DIAGNOSIS — K5730 Diverticulosis of large intestine without perforation or abscess without bleeding: Secondary | ICD-10-CM | POA: Insufficient documentation

## 2019-03-11 DIAGNOSIS — H5462 Unqualified visual loss, left eye, normal vision right eye: Secondary | ICD-10-CM | POA: Diagnosis not present

## 2019-03-11 DIAGNOSIS — Z7951 Long term (current) use of inhaled steroids: Secondary | ICD-10-CM | POA: Insufficient documentation

## 2019-03-11 DIAGNOSIS — D123 Benign neoplasm of transverse colon: Secondary | ICD-10-CM | POA: Insufficient documentation

## 2019-03-11 DIAGNOSIS — Z800 Family history of malignant neoplasm of digestive organs: Secondary | ICD-10-CM | POA: Diagnosis not present

## 2019-03-11 DIAGNOSIS — Z86718 Personal history of other venous thrombosis and embolism: Secondary | ICD-10-CM | POA: Insufficient documentation

## 2019-03-11 DIAGNOSIS — K621 Rectal polyp: Secondary | ICD-10-CM | POA: Insufficient documentation

## 2019-03-11 DIAGNOSIS — K631 Perforation of intestine (nontraumatic): Secondary | ICD-10-CM

## 2019-03-11 DIAGNOSIS — J449 Chronic obstructive pulmonary disease, unspecified: Secondary | ICD-10-CM | POA: Insufficient documentation

## 2019-03-11 DIAGNOSIS — Z7982 Long term (current) use of aspirin: Secondary | ICD-10-CM | POA: Insufficient documentation

## 2019-03-11 DIAGNOSIS — K635 Polyp of colon: Secondary | ICD-10-CM | POA: Diagnosis not present

## 2019-03-11 HISTORY — DX: Presence of spectacles and contact lenses: Z97.3

## 2019-03-11 HISTORY — DX: Presence of dental prosthetic device (complete) (partial): Z97.2

## 2019-03-11 HISTORY — DX: Supraventricular tachycardia: I47.1

## 2019-03-11 HISTORY — DX: Personal history of other specified conditions: Z87.898

## 2019-03-11 HISTORY — DX: Supraventricular tachycardia, unspecified: I47.10

## 2019-03-11 HISTORY — PX: POLYPECTOMY: SHX5525

## 2019-03-11 HISTORY — PX: COLONOSCOPY WITH PROPOFOL: SHX5780

## 2019-03-11 HISTORY — DX: Acute embolism and thrombosis of unspecified deep veins of unspecified lower extremity: I82.409

## 2019-03-11 SURGERY — COLONOSCOPY WITH PROPOFOL
Anesthesia: Monitor Anesthesia Care

## 2019-03-11 MED ORDER — ALBUTEROL SULFATE (2.5 MG/3ML) 0.083% IN NEBU
2.5000 mg | INHALATION_SOLUTION | Freq: Once | RESPIRATORY_TRACT | Status: AC
  Administered 2019-03-11: 2.5 mg via RESPIRATORY_TRACT

## 2019-03-11 MED ORDER — SODIUM CHLORIDE 0.9 % IV SOLN: INTRAVENOUS | Status: DC

## 2019-03-11 MED ORDER — PROPOFOL 500 MG/50ML IV EMUL
INTRAVENOUS | Status: DC | PRN
  Administered 2019-03-11: 150 ug/kg/min via INTRAVENOUS

## 2019-03-11 MED ORDER — LACTATED RINGERS IV SOLN: INTRAVENOUS | Status: DC

## 2019-03-11 MED ORDER — PROPOFOL 10 MG/ML IV BOLUS
INTRAVENOUS | Status: AC
  Filled 2019-03-11: qty 40

## 2019-03-11 MED ORDER — ALBUTEROL SULFATE (2.5 MG/3ML) 0.083% IN NEBU
INHALATION_SOLUTION | RESPIRATORY_TRACT | Status: AC
  Filled 2019-03-11: qty 3

## 2019-03-11 MED ORDER — MIDAZOLAM HCL 2 MG/2ML IJ SOLN
INTRAMUSCULAR | Status: AC
  Filled 2019-03-11: qty 2

## 2019-03-11 MED ORDER — MIDAZOLAM HCL 5 MG/5ML IJ SOLN
INTRAMUSCULAR | Status: DC | PRN
  Administered 2019-03-11: 2 mg via INTRAVENOUS

## 2019-03-11 MED ORDER — LIDOCAINE HCL (CARDIAC) PF 100 MG/5ML IV SOSY
PREFILLED_SYRINGE | INTRAVENOUS | Status: DC | PRN
  Administered 2019-03-11: 100 mg via INTRAVENOUS

## 2019-03-11 MED ORDER — LACTATED RINGERS IV SOLN
INTRAVENOUS | Status: DC | PRN
  Administered 2019-03-11: 13:00:00 via INTRAVENOUS

## 2019-03-11 MED ORDER — PROPOFOL 500 MG/50ML IV EMUL
INTRAVENOUS | Status: DC | PRN
  Administered 2019-03-11: 30 mg via INTRAVENOUS

## 2019-03-11 MED ORDER — PROPOFOL 10 MG/ML IV BOLUS
INTRAVENOUS | Status: AC
  Filled 2019-03-11: qty 20

## 2019-03-11 SURGICAL SUPPLY — 22 items

## 2019-03-11 NOTE — Anesthesia Preprocedure Evaluation (Addendum)
Anesthesia Evaluation  Patient identified by MRN, date of birth, ID band Patient awake    Reviewed: Allergy & Precautions, NPO status , Patient's Chart, lab work & pertinent test results  History of Anesthesia Complications Negative for: history of anesthetic complications  Airway Mallampati: II  TM Distance: >3 FB Neck ROM: Full    Dental no notable dental hx. (+) Edentulous Upper, Edentulous Lower   Pulmonary shortness of breath and Long-Term Oxygen Therapy, COPD,  COPD inhaler, Current Smoker,     + wheezing      Cardiovascular hypertension, + DVT  Normal cardiovascular exam+ dysrhythmias Supra Ventricular Tachycardia  Rhythm:Regular Rate:Normal     Neuro/Psych  Headaches, Seizures -, Well Controlled,  PSYCHIATRIC DISORDERS Anxiety  Peripheral visual deficits Hx brain tumor     GI/Hepatic Neg liver ROS, GERD  Controlled,  Endo/Other   Obesity Pre-DM   Renal/GU negative Renal ROS  negative genitourinary   Musculoskeletal  (+) Arthritis ,   Abdominal   Peds negative pediatric ROS (+)  Hematology negative hematology ROS (+)   Anesthesia Other Findings   Reproductive/Obstetrics negative OB ROS                           Anesthesia Physical Anesthesia Plan  ASA: III  Anesthesia Plan: MAC   Post-op Pain Management:    Induction: Intravenous  PONV Risk Score and Plan: 1 and Propofol infusion and Treatment may vary due to age or medical condition  Airway Management Planned: Nasal Cannula and Natural Airway  Additional Equipment: None  Intra-op Plan:   Post-operative Plan:   Informed Consent: I have reviewed the patients History and Physical, chart, labs and discussed the procedure including the risks, benefits and alternatives for the proposed anesthesia with the patient or authorized representative who has indicated his/her understanding and acceptance.     Dental  advisory given  Plan Discussed with: CRNA and Surgeon  Anesthesia Plan Comments:       Anesthesia Quick Evaluation

## 2019-03-11 NOTE — Op Note (Signed)
[facility] Patient Name: Harold Bailey Procedure Date: 03/11/2019 MRN: [id number] Attending MD: Mauri Pole , MD Date of Birth: 21-Nov-1965 CSN: [id number] Age: 53 Admit Type: Outpatient Procedure:                Colonoscopy Indications:              Positive Cologuard test Providers:                Mauri Pole, MD, Carmie End, RN, Cherylynn Ridges, Technician, Arnoldo Hooker, CRNA Referring MD:              Medicines:                Monitored Anesthesia Care Complications:            No immediate complications. Estimated Blood Loss:     Estimated blood loss was minimal. Procedure:                Pre-Anesthesia Assessment:                           - Prior to the procedure, a History and Physical                            was performed, and patient medications and                            allergies were reviewed. The patient's tolerance of                            previous anesthesia was also reviewed. The risks                            and benefits of the procedure and the sedation                            options and risks were discussed with the patient.                            All questions were answered, and informed consent                            was obtained. Prior Anticoagulants: The patient has                            taken no previous anticoagulant or antiplatelet                            agents except for aspirin and has taken no previous                            anticoagulant or antiplatelet agents except for  NSAID medication. ASA Grade Assessment: IV - A                            patient with severe systemic disease that is a                            constant threat to life. After reviewing the risks                            and benefits, the patient was deemed in                            satisfactory condition to undergo the procedure.       After obtaining informed consent, the colonoscope                            was passed under direct vision. Throughout the                            procedure, the patient's blood pressure, pulse, and                            oxygen saturations were monitored continuously. The                            PCF-H190DL ([id number] Olympus pediatric colonscope                            was introduced through the anus and advanced to the                            the cecum, identified by appendiceal orifice and                            ileocecal valve. The colonoscopy was performed                            without difficulty. The patient tolerated the                            procedure well. The quality of the bowel                            preparation was adequate. The ileocecal valve,                            appendiceal orifice, and rectum were photographed. Scope In: 1:44:13 PM Scope Out: 2:13:11 PM Scope Withdrawal Time: 0 hours 26 minutes 1 second  Total Procedure Duration: 0 hours 28 minutes 58 seconds  Findings:      The perianal and digital rectal examinations were normal.      Eight sessile polyps were found in the rectum, sigmoid colon, transverse       colon and ascending colon. The polyps were 3 to 9  mm in size. These       polyps were removed with a cold snare. Resection and retrieval were       complete.      Two semi-pedunculated polyps were found in the rectum. The polyps were 2       to 3 mm in size. These polyps were removed with a hot snare. Resection       and retrieval were complete.      Scattered small and large-mouthed diverticula were found in the sigmoid       colon, descending colon, transverse colon and ascending colon.      Non-bleeding internal hemorrhoids were found during retroflexion. The       hemorrhoids were medium-sized. Impression:               - Eight 3 to 9 mm polyps in the rectum, in the                            sigmoid colon, in  the transverse colon and in the                            ascending colon, removed with a cold snare.                            Resected and retrieved.                           - Two 2 to 3 mm polyps in the rectum, removed with                            a hot snare. Resected and retrieved.                           - Moderate diverticulosis in the sigmoid colon, in                            the descending colon, in the transverse colon and                            in the ascending colon.                           - Non-bleeding internal hemorrhoids. Moderate Sedation:      Not Applicable - Patient had care per Anesthesia. Recommendation:           - Patient has a contact number available for                            emergencies. The signs and symptoms of potential                            delayed complications were discussed with the                            patient. Return to normal activities tomorrow.  Written discharge instructions were provided to the                            patient.                           - Resume previous diet.                           - Continue present medications.                           - Await pathology results.                           - Repeat colonoscopy date to be determined after                            pending pathology results are reviewed for                            surveillance based on pathology results. Procedure Code(s):        --- Professional ---                           [phone], Colonoscopy, flexible; with removal of                            tumor(s), polyp(s), or other lesion(s) by snare                            technique Diagnosis Code(s):        --- Professional ---                           K63.5, Polyp of colon                           K62.1, Rectal polyp                           K64.8, Other hemorrhoids                           R19.5, Other fecal abnormalities                            K57.30, Diverticulosis of large intestine without                            perforation or abscess without bleeding CPT copyright 2019 American Medical Association. All rights reserved. The codes documented in this report are preliminary and upon coder review may  be revised to meet current compliance requirements. Mauri Pole, MD 03/11/2019 2:22:23 PM This report has been signed electronically. Number of Addenda: 0

## 2019-03-11 NOTE — Discharge Instructions (Signed)

## 2019-03-11 NOTE — Transfer of Care (Signed)
Immediate Anesthesia Transfer of Care Note  Patient: Harold Bailey  Procedure(s) Performed: Procedure(s): COLONOSCOPY WITH PROPOFOL (N/A) POLYPECTOMY  Patient Location: PACU  Anesthesia Type:MAC  Level of Consciousness:  sedated, patient cooperative and responds to stimulation  Airway & Oxygen Therapy:Patient Spontanous Breathing and Patient connected to face mask oxgen  Post-op Assessment:  Report given to PACU RN and Post -op Vital signs reviewed and stable  Post vital signs:  Reviewed and stable  Last Vitals:  Vitals:   03/11/19 1305 03/11/19 1420  BP: 127/87 129/77  Pulse: 96 (!) 107  Resp: 15 (!) 21  Temp: (!) 36.1 C 37.3 C  SpO2: 62% 26%    Complications: No apparent anesthesia complications

## 2019-03-11 NOTE — Interval H&P Note (Signed)
History and Physical Interval Note:  03/11/2019 1:03 PM  Harold Bailey  has presented today for surgery, with the diagnosis of Positive cologard.  The various methods of treatment have been discussed with the patient and family. After consideration of risks, benefits and other options for treatment, the patient has consented to  Procedure(s): COLONOSCOPY WITH PROPOFOL (N/A) as a surgical intervention.  The patient's history has been reviewed, patient examined, no change in status, stable for surgery.  I have reviewed the patient's chart and labs.  Questions were answered to the patient's satisfaction.     Kavitha Nandigam

## 2019-03-12 NOTE — Anesthesia Postprocedure Evaluation (Signed)
Anesthesia Post Note  Patient: Harold Bailey  Procedure(s) Performed: COLONOSCOPY WITH PROPOFOL (N/A ) POLYPECTOMY     Patient location during evaluation: PACU Anesthesia Type: MAC Level of consciousness: awake and alert Pain management: pain level controlled Vital Signs Assessment: post-procedure vital signs reviewed and stable Respiratory status: spontaneous breathing, nonlabored ventilation and respiratory function stable Cardiovascular status: stable and blood pressure returned to baseline Anesthetic complications: no    Last Vitals:  Vitals:   03/11/19 1510 03/11/19 1513  BP: (!) 146/84   Pulse: 85 81  Resp: 16 11  Temp:    SpO2: 92% 92%    Last Pain:  Vitals:   03/11/19 1420  TempSrc: Temporal  PainSc: 0-No pain   Pain Goal:                   Audry Pili

## 2019-03-15 ENCOUNTER — Encounter (HOSPITAL_COMMUNITY): Payer: Self-pay | Admitting: Gastroenterology

## 2019-03-19 ENCOUNTER — Ambulatory Visit: Payer: Medicare HMO | Admitting: Neurology

## 2019-03-23 ENCOUNTER — Telehealth: Payer: Self-pay | Admitting: Orthopedic Surgery

## 2019-03-23 NOTE — Telephone Encounter (Signed)
Patient is having toothache like pains in his thigh in the middle down to the bone. Wanting to know if it has something to do with previous surgeries or if he needs new x rays.  Please advise

## 2019-03-23 NOTE — Telephone Encounter (Signed)
Can you please call pt and make an appt. Not clear what this could be and needs evaluation. Thank you!

## 2019-03-25 ENCOUNTER — Encounter: Payer: Self-pay | Admitting: Gastroenterology

## 2019-03-30 ENCOUNTER — Encounter: Payer: Self-pay | Admitting: Orthopedic Surgery

## 2019-03-30 ENCOUNTER — Ambulatory Visit (INDEPENDENT_AMBULATORY_CARE_PROVIDER_SITE_OTHER): Payer: Medicare HMO | Admitting: Orthopedic Surgery

## 2019-03-30 ENCOUNTER — Ambulatory Visit: Payer: Medicare HMO | Admitting: Orthopedic Surgery

## 2019-03-30 ENCOUNTER — Other Ambulatory Visit: Payer: Self-pay

## 2019-03-30 VITALS — Ht 72.0 in | Wt 240.0 lb

## 2019-03-30 DIAGNOSIS — M5442 Lumbago with sciatica, left side: Secondary | ICD-10-CM | POA: Diagnosis not present

## 2019-03-30 MED ORDER — PREDNISONE 50 MG PO TABS: ORAL_TABLET | ORAL | 0 refills | Status: DC

## 2019-03-31 ENCOUNTER — Encounter: Payer: Self-pay | Admitting: Orthopedic Surgery

## 2019-03-31 NOTE — Progress Notes (Signed)
Office Visit Note   Patient: Harold Bailey           Date of Birth: Sep 11, 1966           MRN: [id number] Visit Date: 03/30/2019              Requested by: Everardo Beals, NP [address] [place],  [place] [address] PCP: Everardo Beals, NP  Chief Complaint  Patient presents with  . Left Leg - Numbness      HPI: The patient is a 53 year old gentleman who presents today complaining of left thigh numbness and a deep aching pain.  This is been ongoing for some weeks.  He has not had any recent injury to his back.  He has no history of sciatica.  Denies any weakness no loss of control of bowel or bladder.  The numbness and aching is anterior as well as lateral.  Minimal pain in his buttocks.  Assessment & Plan: Visit Diagnoses:  1. Acute left-sided low back pain with left-sided sciatica     Plan: We will trial him on a short course of prednisone.  This was called into his pharmacy.  He will follow-up in the office in 3 to 4 weeks should he not have resolution of the symptoms.  Follow-Up Instructions: Return in about 3 weeks (around 04/20/2019).   Back Exam   Tenderness  The patient is experiencing no tenderness.   Range of Motion  The patient has normal back ROM.  Muscle Strength  The patient has normal back strength.  Tests  Straight leg raise right: negative Straight leg raise left: positive  Other  Gait: normal       Patient is alert, oriented, no adenopathy, well-dressed, normal affect, normal respiratory effort.   Imaging: No results found. No images are attached to the encounter.  Labs: Lab Results  Component Value Date   HGBA1C 6.3 (H) 06/10/2017   HGBA1C 6.3 (H) 05/28/2017   HGBA1C 5.9 (H) 11/22/2013   REPTSTATUS 05/21/2017 FINAL 05/16/2017   REPTSTATUS 05/21/2017 FINAL 05/16/2017   GRAMSTAIN  05/11/2017    ABUNDANT WBC PRESENT, PREDOMINANTLY PMN FEW GRAM NEGATIVE RODS    CULT NO GROWTH 5 DAYS 05/16/2017   CULT NO GROWTH 5  DAYS 05/16/2017   LABORGA METHICILLIN RESISTANT STAPHYLOCOCCUS AUREUS 05/11/2017     Lab Results  Component Value Date   ALBUMIN 4.4 05/08/2018   ALBUMIN 2.3 (L) 05/28/2017   ALBUMIN 2.8 (L) 05/12/2017    Lab Results  Component Value Date   MG 2.0 05/28/2017   MG 2.3 05/23/2017   MG 2.2 05/21/2017   No results found for: VD25OH  No results found for: PREALBUMIN CBC EXTENDED Latest Ref Rng & Units 05/08/2018 06/08/2017 05/30/2017  WBC 4.0 - 10.5 K/uL 7.7 8.2 7.7  RBC 4.22 - 5.81 Mil/uL 5.78 4.78 4.02(L)  HGB 13.0 - 17.0 g/dL 17.8(H) 15.4 13.1  HCT 39.0 - 52.0 % 53.6(H) 48.4 39.9  PLT 150.0 - 400.0 K/uL 162.0 193 123(L)  NEUTROABS 1.7 - 7.7 K/uL - - 4.8  LYMPHSABS 0.7 - 4.0 K/uL - - 2.2     Body mass index is 32.55 kg/m.  Orders:  No orders of the defined types were placed in this encounter.  Meds ordered this encounter  Medications  . predniSONE (DELTASONE) 50 MG tablet    Sig: Take one tablet by mouth once daily for 5 days    Dispense:  5 tablet    Refill:  0  Procedures: No procedures performed  Clinical Data: No additional findings.  ROS:  All other systems negative, except as noted in the HPI. Review of Systems  Constitutional: Negative for chills and fever.  Musculoskeletal: Positive for back pain.  Neurological: Positive for numbness. Negative for weakness.    Objective: Vital Signs: Ht 6' (1.829 m)   Wt 240 lb (108.9 kg)   BMI 32.55 kg/m   Specialty Comments:  No specialty comments available.  PMFS History: Patient Active Problem List   Diagnosis Date Noted  . Positive colorectal cancer screening using Cologuard test   . Otitis media, left 10/15/2018  . On home oxygen therapy 08/26/2018  . Difficult intravenous access   . Chronic respiratory failure with hypoxia ([place])   . Essential hypertension 05/11/2017  . Plantar fasciitis of left foot 01/14/2017  . Traumatic arthritis of ankle, right 01/07/2017  . H/O ankle fusion 11/14/2016   . 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 At risk for adverse drug event 10/08/2016  . History of snoring 10/08/2016  . Subtalar joint instability, left   . 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 History of alcohol abuse 07/02/2016  . COPD exacerbation ([place]) 07/02/2016  . Tobacco dependence 06/06/2015  . Medication overuse headache 02/28/2015  . Localization-related symptomatic epilepsy and epileptic syndromes with complex partial seizures, not intractable, without status epilepticus ([place]) 02/28/2015  . Cognitive impairment 02/28/2015  . Tobacco abuse 02/28/2015  . Brain tumor ([place]) 11/19/2013  . Seizures ([place]) 11/19/2013  . Memory loss   . HA (headache)   . Gait disturbance   . Falls frequently 10/15/2013  . Swelling 09/09/2013  . Rash and nonspecific skin eruption 09/09/2013  . Ankle fracture, lateral malleolus, closed 09/03/2013  . Unspecified vitamin D deficiency 08/12/2013  . Polycythemia   . Memory deficit   . History of drug abuse ([place])   . GERD (gastroesophageal reflux disease)   . Anxiety   . Arthritis   . Hyperlipidemia   . Prediabetes    Past Medical History:  Diagnosis Date  . Anxiety   . Arthritis    Left ankle, hand  . COPD (chronic obstructive pulmonary disease) ([place])   . COPD (chronic obstructive pulmonary disease) ([place])   . Drug abuse ([place])   . DVT (deep venous thrombosis) ([facility])    Bilateral legs  . Gait disturbance   . GERD (gastroesophageal reflux disease)   . 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  Marland Kitchen History of ankle surgery   . History of brain tumor   . Hyperlipidemia   . Hypertension   . 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 Memory loss   . On home oxygen therapy 08/26/2018   at night while lying down  . Peripheral vision loss    Left due to brain surgery  . Pneumonia   . Polycythemia   . Pre-diabetes   . Seizures ([place]) 11/19/2013   none  since 2016  . Shortness of breath    Hx of smoking.  Marland Kitchen SVT (supraventricular tachycardia) ([place])   . Wears dentures   . Wears glasses     Family History  Problem Relation Age of Onset  . Hypertension Mother   . Bronchitis Mother   . Cancer Maternal Aunt   . Cancer Maternal Grandmother   . Stroke Neg Hx   . Diabetes Neg Hx     Past Surgical History:  Procedure Laterality Date  . ABDOMINAL AORTOGRAM W/LOWER EXTREMITY N/A 05/30/2017   Procedure: ABDOMINAL AORTOGRAM W/LOWER EXTREMITY;  Surgeon: Elam Dutch, MD;  Location: [facility];  Service: Cardiovascular;  Laterality: N/A;  . ANKLE ARTHROSCOPY  01/17/2012   Procedure: ANKLE ARTHROSCOPY;  Surgeon: [name], MD;  Location: [facility];  Service: Orthopedics;  Laterality: Left;  . ANKLE ARTHROSCOPY WITH FUSION Left 10/02/2016   Procedure: Left Posterior Subtalar Arthrodesis Arthroscopy;  Surgeon: [name], MD;  Location: [facility];  Service: Orthopedics;  Laterality: Left;  . ANKLE FUSION Left 11/2012   Dr Sharol Given  . ANKLE SURGERY  2003   Left  . BRAIN SURGERY  2009   Biopsy  . COLONOSCOPY WITH PROPOFOL N/A 03/11/2019   Procedure: COLONOSCOPY WITH PROPOFOL;  Surgeon: Mauri Pole, MD;  Location: [facility];  Service: Endoscopy;  Laterality: N/A;  . FRACTURE SURGERY  2003   Left ankle  . HARDWARE REMOVAL Left 12/08/2012   Procedure: HARDWARE REMOVAL;  Surgeon: [name], MD;  Location: [facility];  Service: Orthopedics;  Laterality: Left;  Removal Deep Hardware, Take Down Non-Union, Revision Internal Fixation Left Ankle  . I&D EXTREMITY Left 01/01/2013   Procedure: IRRIGATION AND DEBRIDEMENT EXTREMITY;  Surgeon: [name], MD;  Location: [facility];  Service: Orthopedics;  Laterality: Left;  Irrigation, Debridement and Placement Antibiotic Beads Left Ankle  . INNER EAR SURGERY    . OPEN REDUCTION INTERNAL FIXATION (ORIF) SCAPHOID WITH DISTAL RADIUS GRAFT Right 02/01/2013   Procedure: OPEN REDUCTION INTERNAL FIXATION (ORIF)  RIGHT SCAPHOID FRACTURE WITH DISTAL RADIUS GRAFT;  Surgeon: Schuyler Amor, MD;  Location: [facility];  Service: Orthopedics;  Laterality: Right;  . ORIF ANKLE FRACTURE Left 12/08/2012   Procedure: OPEN REDUCTION INTERNAL FIXATION (ORIF) ANKLE FRACTURE;  Surgeon: [name], MD;  Location: [facility];  Service: Orthopedics;  Laterality: Left;  Removal Deep Hardware, Take Down Non-Union, Revision Internal Fixation Left Ankle  . ORIF ANKLE FRACTURE Right 09/03/2013   Procedure: OPEN REDUCTION INTERNAL FIXATION (ORIF) ANKLE FRACTURE;  Surgeon: [name], MD;  Location: [facility];  Service: Orthopedics;  Laterality: Right;  Open Reduction Internal Fixation Right Fibula  . POLYPECTOMY  03/11/2019   Procedure: POLYPECTOMY;  Surgeon: Mauri Pole, MD;  Location: [facility];  Service: Endoscopy;;  . SCAPHOID FRACTURE SURGERY  02/04/2013  . SHOULDER ARTHROSCOPY  2008   Left  . TYMPANOSTOMY TUBE PLACEMENT  1993   Social History   Occupational History    Employer: NOT EMPLOYED    Comment: Disabled  Tobacco Use  . Smoking status: Current Every Day Smoker    Packs/day: 1.00    Years: 40.00    Pack years: 40.00    Types: Cigarettes  . Smokeless tobacco: Never Used  Substance and Sexual Activity  . Alcohol use: No    Alcohol/week: 0.0 standard drinks    Comment: quit Oct. 2017(heavy drinker) .In [place]  . Drug use: No  . Sexual activity: Not Currently

## 2019-04-01 ENCOUNTER — Encounter (HOSPITAL_COMMUNITY)
Admission: RE | Admit: 2019-04-01 | Discharge: 2019-04-01 | Disposition: A | Discharge status: Home / Self Care | Payer: Medicare HMO | Source: Ambulatory Visit | Attending: Pulmonary Disease | Admitting: Pulmonary Disease

## 2019-04-01 ENCOUNTER — Other Ambulatory Visit: Payer: Self-pay

## 2019-04-01 VITALS — BP 140/80 | HR 100 | Temp 97.7°F | Ht 72.0 in | Wt 238.1 lb

## 2019-04-01 DIAGNOSIS — J432 Centrilobular emphysema: Secondary | ICD-10-CM | POA: Insufficient documentation

## 2019-04-01 NOTE — Progress Notes (Signed)
Daily Session Note  Patient Details  Name: Harold Bailey MRN: [id number] Date of Birth: 05-26-1966 Referring Provider:     PULMONARY REHAB OTHER RESP ORIENTATION from 04/01/2019 in [place]  Referring Provider  [name]      Encounter Date: 04/01/2019  Check In: Session Check In - 04/01/19 1315      Check-In   Supervising physician immediately available to respond to emergencies  See telemetry face sheet for immediately available MD    Location  [facility]    Staff Present  Russella Dar, MS, EP, [facility], Exercise Physiologist;Ronrico Dupin Wynetta Emery, RN, Cory Munch, Exercise Physiologist    Virtual Visit  No    Medication changes reported      No    Fall or balance concerns reported     Yes    Comments  Patient has gait disturbance and has had multipe falls.    Tobacco Cessation  --   Patient smokes 1.5 packs/day. He says he wants to quit but is not actively trying.   Warm-up and Cool-down  Performed as group-led Higher education careers adviser Performed  Yes    VAD Patient?  No    PAD/SET Patient?  No      Pain Assessment   Currently in Pain?  Yes    Pain Score  5     Pain Location  Ankle    Pain Orientation  Right;Left    Pain Descriptors / Indicators  Aching    Pain Type  Chronic pain    Pain Radiating Towards  No radiation    Pain Onset  Other (comment)   Pain is chronic.   Pain Frequency  Constant    Aggravating Factors   Cold weather.    Pain Relieving Factors  Patient takes BC powder    Effect of Pain on Daily Activities  Limits activities at times.    Multiple Pain Sites  No       Capillary Blood Glucose: No results found for this or any previous visit (from the past 24 hour(s)).  Exercise Prescription Changes - 04/01/19 1500      Home Exercise Plan   Plans to continue exercise at  Home (comment)    Frequency  Add 3 additional days to program exercise sessions.    Initial Home Exercises Provided  04/01/19        Social History   Tobacco Use  Smoking Status Current Every Day Smoker  . Packs/day: 1.00  . Years: 40.00  . Pack years: 40.00  . Types: Cigarettes  Smokeless Tobacco Never Used    Goals Met:  Proper associated with RPD/PD & O2 Sat Exercise tolerated well No report of cardiac concerns or symptoms Strength training completed today  Goals Unmet:  Not Applicable  Comments: Check out 1530   Dr. Sinda Du is Medical Director for [facility].

## 2019-04-01 NOTE — Progress Notes (Signed)
Pulmonary Individual Treatment Plan  Patient Details  Name: Harold Bailey MRN: [id number] Date of Birth: November 01, 1965 Referring Provider:     PULMONARY REHAB OTHER RESP ORIENTATION from 04/01/2019 in [facility]  Referring Provider  [name]      Initial Encounter Date:    PULMONARY REHAB OTHER RESP ORIENTATION from 04/01/2019 in [facility]  Date  04/01/19      Visit Diagnosis: Centrilobular emphysema  Patient's Home Medications on Admission:   Current Outpatient Medications:  .  ibuprofen (ADVIL) 800 MG tablet, Take 800 mg by mouth every 8 (eight) hours as needed for mild pain or moderate pain. Do not take with meloxicam, Disp: , Rfl:  .  albuterol (PROVENTIL HFA;VENTOLIN HFA) 108 (90 Base) MCG/ACT inhaler, Inhale 2 puffs into the lungs every 6 (six) hours as needed for wheezing or shortness of breath., Disp: 1 Inhaler, Rfl: 6 .  Alfalfa 500 MG TABS, Take 1,000 mg by mouth 2 (two) times a day., Disp: , Rfl:  .  ALPRAZolam (XANAX) 1 MG tablet, Take 1 mg by mouth 3 (three) times daily as needed for anxiety. , Disp: , Rfl:  .  amitriptyline (ELAVIL) 25 MG tablet, Take 1 tablet (25 mg total) by mouth at bedtime., Disp: 30 tablet, Rfl: 3 .  ascorbic acid (VITAMIN C) 1000 MG tablet, Take 1,000 mg by mouth daily., Disp: , Rfl:  .  aspirin EC 81 MG tablet, Take 81 mg by mouth daily., Disp: , Rfl:  .  BREO ELLIPTA 200-25 MCG/INH AEPB, Inhale 1 puff into the lungs daily., Disp: , Rfl:  .  divalproex (DEPAKOTE ER) 500 MG 24 hr tablet, TAKE 2 TABLETS(1000 MG) BY MOUTH DAILY (Patient taking differently: Take 500 mg by mouth 2 (two) times a day. ), Disp: 180 tablet, Rfl: 3 .  Fluticasone-Umeclidin-Vilant (TRELEGY ELLIPTA) 100-62.5-25 MCG/INH AEPB, Inhale 1 puff into the lungs daily., Disp: 1 each, Rfl: 5 .  gabapentin (NEURONTIN) 300 MG capsule, TAKE 1 CAPSULE(300 MG) BY MOUTH TWICE DAILY (Patient taking differently: Take 300 mg by mouth 2 (two) times daily.  ), Disp: 60 capsule, Rfl: 5 .  LECITHIN PO, Take 1 capsule by mouth daily., Disp: , Rfl:  .  meloxicam (MOBIC) 7.5 MG tablet, Take 7.5 mg by mouth daily., Disp: , Rfl:  .  Multiple Vitamins-Minerals (MULTIVITAMIN WITH MINERALS) tablet, Take 1 tablet by mouth daily., Disp: , Rfl:  .  Naphazoline-Pheniramine (OPCON-A) 0.027-0.315 % SOLN, Place 1 drop into both eyes every 8 (eight) hours as needed (red, irritated eyes)., Disp: , Rfl:  .  omeprazole (PRILOSEC) 40 MG capsule, Take 40 mg by mouth daily. , Disp: , Rfl:  .  Oxcarbazepine (TRILEPTAL) 300 MG tablet, TAKE 1 TABLET(300 MG) BY MOUTH TWICE DAILY (Patient taking differently: Take 300 mg by mouth 2 (two) times daily. ), Disp: 180 tablet, Rfl: 1 .  OXYGEN, Inhale into the lungs. At home oxygen, Disp: , Rfl:  .  predniSONE (DELTASONE) 50 MG tablet, Take one tablet by mouth once daily for 5 days, Disp: 5 tablet, Rfl: 0 .  rosuvastatin (CRESTOR) 10 MG tablet, Take 10 mg by mouth daily at 6 PM., Disp: , Rfl:  .  VOLTAREN 1 % GEL, APPLY 2 TO 4 GRAMS EXTERNALLY TO THE AFFECTED AREA TWICE DAILY (Patient taking differently: Apply 2 g topically 2 (two) times daily as needed (pain). ), Disp: 500 g, Rfl: 0  Past Medical History: Past Medical History:  Diagnosis Date  . Anxiety   .  Arthritis    Left ankle, hand  . COPD (chronic obstructive pulmonary disease) ([place])   . COPD (chronic obstructive pulmonary disease) ([place])   . Drug abuse ([place])   . DVT (deep venous thrombosis) ([facility])    Bilateral legs  . Gait disturbance   . GERD (gastroesophageal reflux disease)   . 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  Marland Kitchen History of ankle surgery   . History of brain tumor   . Hyperlipidemia   . Hypertension   . 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 Memory loss   . On home oxygen therapy 08/26/2018   at night while lying down  . Peripheral vision loss    Left due to brain surgery  .  Pneumonia   . Polycythemia   . Pre-diabetes   . Seizures ([place]) 11/19/2013   none since 2016  . Shortness of breath    Hx of smoking.  Marland Kitchen SVT (supraventricular tachycardia) ([place])   . Wears dentures   . Wears glasses     Tobacco Use: Social History   Tobacco Use  Smoking Status Current Every Day Smoker  . Packs/day: 1.00  . Years: 40.00  . Pack years: 40.00  . Types: Cigarettes  Smokeless Tobacco Never Used    Labs: Recent Review Flowsheet Data    Labs for ITP Cardiac and Pulmonary Rehab Latest Ref Rng & Units 05/20/2017 05/21/2017 05/24/2017 05/28/2017 06/10/2017   Cholestrol 0 - 200 mg/dL - - - - -   LDLCALC 0 - 99 mg/dL - - - - -   HDL >39 mg/dL - - - - -   Trlycerides <150 mg/dL - 205(H) 219(H) - -   Hemoglobin A1c 4.8 - 5.6 % - - - 6.3(H) 6.3(H)   PHART 7.350 - 7.450 7.476(H) 7.488(H) - - -   PCO2ART 32.0 - 48.0 mmHg 45.1 38.2 - - -   HCO3 20.0 - 28.0 mmol/L 33.3(H) 29.0(H) - - -   TCO2 22 - 32 mmol/L 35(H) 30 - - -   O2SAT % 91.0 95.0 - - -      Capillary Blood Glucose: Lab Results  Component Value Date   GLUCAP 172 (H) 05/27/2017   GLUCAP 103 (H) 05/27/2017   GLUCAP 92 05/27/2017   GLUCAP 105 (H) 05/27/2017   GLUCAP 127 (H) 05/26/2017     Pulmonary Assessment Scores: Pulmonary Assessment Scores    Row Name 04/01/19 1524         ADL UCSD   ADL Phase  Entry     SOB Score total  62     Rest  4     Walk  6     Stairs  2     Bath  3     Dress  3     Shop  3       CAT Score   CAT Score  24       mMRC Score   mMRC Score  3       UCSD: Self-administered rating of dyspnea associated with activities of daily living (ADLs) 6-point scale (0 = "not at all" to 5 = "maximal or unable to do because of breathlessness")  Scoring Scores range from 0 to 120.  Minimally important difference is 5 units  CAT: CAT can identify the health impairment of COPD patients and is better correlated with disease progression.  CAT has a scoring  range of zero to 40. The CAT  score is classified into four groups of low (less than 10), medium (10 - 20), high (21-30) and very high (31-40) based on the impact level of disease on health status. A CAT score over 10 suggests significant symptoms.  A worsening CAT score could be explained by an exacerbation, poor medication adherence, poor inhaler technique, or progression of COPD or comorbid conditions.  CAT MCID is 2 points  mMRC: mMRC (Modified Medical Research Council) Dyspnea Scale is used to assess the degree of baseline functional disability in patients of respiratory disease due to dyspnea. No minimal important difference is established. A decrease in score of 1 point or greater is considered a positive change.   Pulmonary Function Assessment: Pulmonary Function Assessment - 04/01/19 1548      Pulmonary Function Tests   FVC%  2.4 %    FEV1%  1.4 %    FEV1/FVC Ratio  58      Breath   Shortness of Breath  Yes       Exercise Target Goals: Exercise Program Goal: Individual exercise prescription set using results from initial 6 min walk test and THRR while considering  patient's activity barriers and safety.   Exercise Prescription Goal: Initial exercise prescription builds to 30-45 minutes a day of aerobic activity, 2-3 days per week.  Home exercise guidelines will be given to patient during program as part of exercise prescription that the participant will acknowledge.  Activity Barriers & Risk Stratification: Activity Barriers & Cardiac Risk Stratification - 04/01/19 1518      Activity Barriers & Cardiac Risk Stratification   Activity Barriers  Shortness of Breath;Joint Problems   bilateral ankle replacement   Cardiac Risk Stratification  High       6 Minute Walk: 6 Minute Walk    Row Name 04/01/19 1517         6 Minute Walk   Phase  Initial     Distance  700 feet     Walk Time  6 minutes     # of Rest Breaks  0     MPH  1.32     METS  2.01     RPE  12     Perceived Dyspnea   14     VO2  Peak  9.75     Symptoms  Yes (comment)     Comments  5/10 bilateral ankle pain     Resting HR  100 bpm     Resting BP  140/80     Resting Oxygen Saturation   91 %     Exercise Oxygen Saturation  during 6 min walk  88 %     Max Ex. HR  107 bpm     Max Ex. BP  160/78     2 Minute Post BP  138/80        Oxygen Initial Assessment: Oxygen Initial Assessment - 04/01/19 1523      Home Oxygen   Home Oxygen Device  Home Concentrator    Sleep Oxygen Prescription  Continuous    Liters per minute  4    Home Exercise Oxygen Prescription  None    Home at Rest Exercise Oxygen Prescription  --   As needed at 4 L/min   Compliance with Home Oxygen Use  Yes      Initial 6 min Walk   Oxygen Used  None      Program Oxygen Prescription   Program Oxygen  Prescription  --   Will use if O2 sat >88% at 2 to 4 L/Min     Intervention   Short Term Goals  To learn and exhibit compliance with exercise, home and travel O2 prescription;To learn and understand importance of monitoring SPO2 with pulse oximeter and demonstrate accurate use of the pulse oximeter.;To learn and understand importance of maintaining oxygen saturations>88%;To learn and demonstrate proper pursed lip breathing techniques or other breathing techniques.    Long  Term Goals  Exhibits compliance with exercise, home and travel O2 prescription;Verbalizes importance of monitoring SPO2 with pulse oximeter and return demonstration;Maintenance of O2 saturations>88%;Exhibits proper breathing techniques, such as pursed lip breathing or other method taught during program session       Oxygen Re-Evaluation:   Oxygen Discharge (Final Oxygen Re-Evaluation):   Initial Exercise Prescription: Initial Exercise Prescription - 04/01/19 1500      Date of Initial Exercise RX and Referring Provider   Date  04/01/19    Referring Provider  [name]    Expected Discharge Date  07/02/19      NuStep   Level  1    SPM  80    Minutes  17    METs  1.8       Arm Ergometer   Level  1    Watts  12    RPM  50    Minutes  17    METs  1.8      Prescription Details   Frequency (times per week)  2    Duration  Progress to 30 minutes of continuous aerobic without signs/symptoms of physical distress      Intensity   THRR 40-80% of Max Heartrate  [phone]    Ratings of Perceived Exertion  11-13    Perceived Dyspnea  0-4      Progression   Progression  Continue to progress workloads to maintain intensity without signs/symptoms of physical distress.      Resistance Training   Training Prescription  Yes    Weight  1    Reps  10-15       Perform Capillary Blood Glucose checks as needed.  Exercise Prescription Changes:  Exercise Prescription Changes    Row Name 04/01/19 1500             Home Exercise Plan   Plans to continue exercise at  Home (comment)       Frequency  Add 3 additional days to program exercise sessions.       Initial Home Exercises Provided  04/01/19          Exercise Comments:   Exercise Goals and Review:  Exercise Goals    Row Name 04/01/19 1519             Exercise Goals   Increase Physical Activity  Yes       Intervention  Develop an individualized exercise prescription for aerobic and resistive training based on initial evaluation findings, risk stratification, comorbidities and participant's personal goals.       Expected Outcomes  Short Term: Attend rehab on a regular basis to increase amount of physical activity.;Long Term: Add in home exercise to make exercise part of routine and to increase amount of physical activity.;Long Term: Exercising regularly at least 3-5 days a week.       Increase Strength and Stamina  Yes       Intervention  Develop an individualized exercise prescription for aerobic and resistive training based on initial evaluation  findings, risk stratification, comorbidities and participant's personal goals.       Expected Outcomes  Short Term: Increase workloads from initial  exercise prescription for resistance, speed, and METs.;Short Term: Perform resistance training exercises routinely during rehab and add in resistance training at home;Long Term: Improve cardiorespiratory fitness, muscular endurance and strength as measured by increased METs and functional capacity (6MWT)       Able to understand and use rate of perceived exertion (RPE) scale  Yes       Intervention  Provide education and explanation on how to use RPE scale       Expected Outcomes  Short Term: Able to use RPE daily in rehab to express subjective intensity level;Long Term:  Able to use RPE to guide intensity level when exercising independently       Able to understand and use Dyspnea scale  Yes       Intervention  Provide education and explanation on how to use Dyspnea scale       Expected Outcomes  Short Term: Able to use Dyspnea scale daily in rehab to express subjective sense of shortness of breath during exertion;Long Term: Able to use Dyspnea scale to guide intensity level when exercising independently       Knowledge and understanding of Target Heart Rate Range (THRR)  Yes       Intervention  Provide education and explanation of THRR including how the numbers were predicted and where they are located for reference       Expected Outcomes  Short Term: Able to state/look up THRR;Long Term: Able to use THRR to govern intensity when exercising independently;Short Term: Able to use daily as guideline for intensity in rehab       Able to check pulse independently  Yes       Intervention  Provide education and demonstration on how to check pulse in carotid and radial arteries.;Review the importance of being able to check your own pulse for safety during independent exercise       Expected Outcomes  Short Term: Able to explain why pulse checking is important during independent exercise;Long Term: Able to check pulse independently and accurately       Understanding of Exercise Prescription  Yes        Intervention  Provide education, explanation, and written materials on patient's individual exercise prescription       Expected Outcomes  Short Term: Able to explain program exercise prescription;Long Term: Able to explain home exercise prescription to exercise independently          Exercise Goals Re-Evaluation :   Discharge Exercise Prescription (Final Exercise Prescription Changes): Exercise Prescription Changes - 04/01/19 1500      Home Exercise Plan   Plans to continue exercise at  Home (comment)    Frequency  Add 3 additional days to program exercise sessions.    Initial Home Exercises Provided  04/01/19       Nutrition:  Target Goals: Understanding of nutrition guidelines, daily intake of sodium 1500mg , cholesterol 200mg , calories 30% from fat and 7% or less from saturated fats, daily to have 5 or more servings of fruits and vegetables.  Biometrics: Pre Biometrics - 04/01/19 1520      Pre Biometrics   Height  6' (1.829 m)    Weight  108 kg    Waist Circumference  44 inches    Hip Circumference  48 inches    Waist to Hip Ratio  0.92 %  BMI (Calculated)  32.28    Triceps Skinfold  9 mm    % Body Fat  28.1 %    Grip Strength  7.1 kg    Flexibility  0 in    Single Leg Stand  0 seconds        Nutrition Therapy Plan and Nutrition Goals:   Nutrition Assessments: Nutrition Assessments - 04/01/19 1521      MEDFICTS Scores   Pre Score  53       Nutrition Goals Re-Evaluation:   Nutrition Goals Discharge (Final Nutrition Goals Re-Evaluation):   Psychosocial: Target Goals: Acknowledge presence or absence of significant depression and/or stress, maximize coping skills, provide positive support system. Participant is able to verbalize types and ability to use techniques and skills needed for reducing stress and depression.  Initial Review & Psychosocial Screening: Initial Psych Review & Screening - 04/01/19 1526      Initial Review   Current issues with   Current Depression      Family Dynamics   Good Support System?  Yes    Comments  Patient says he dranks 6 to 12 beers/day. He says he is depressed due to his chronic health conditions.      Barriers   Psychosocial barriers to participate in program  Psychosocial barriers identified (see note)      Screening Interventions   Interventions  Encouraged to exercise;To provide support and resources with identified psychosocial needs;Provide feedback about the scores to participant    Expected Outcomes  Short Term goal: Utilizing psychosocial counselor, staff and physician to assist with identification of specific Stressors or current issues interfering with healing process. Setting desired goal for each stressor or current issue identified.;Short Term goal: Identification and review with participant of any Quality of Life or Depression concerns found by scoring the questionnaire.;Long Term goal: The participant improves quality of Life and PHQ9 Scores as seen by post scores and/or verbalization of changes       Quality of Life Scores: Quality of Life - 04/01/19 1521      Quality of Life   Select  Quality of Life      Quality of Life Scores   Health/Function Pre  4.53 %    Socioeconomic Pre  8.57 %    Psych/Spiritual Pre  17.14 %    Family Pre  15.6 %    GLOBAL Pre  9.59 %      Scores of 19 and below usually indicate a poorer quality of life in these areas.  A difference of  2-3 points is a clinically meaningful difference.  A difference of 2-3 points in the total score of the Quality of Life Index has been associated with significant improvement in overall quality of life, self-image, physical symptoms, and general health in studies assessing change in quality of life.   PHQ-9: Recent Review Flowsheet Data    Depression screen [facility] 2/9 04/01/2019   Decreased Interest 1   Down, Depressed, Hopeless 1   PHQ - 2 Score 2   Altered sleeping 1   Tired, decreased energy 1   Change in appetite  1   Feeling bad or failure about yourself  1   Trouble concentrating 1   Moving slowly or fidgety/restless 2   Suicidal thoughts 0   PHQ-9 Score 9   Difficult doing work/chores Somewhat difficult     Interpretation of Total Score  Total Score Depression Severity:  1-4 = Minimal depression, 5-9 = Mild depression, 10-14 = Moderate  depression, 15-19 = Moderately severe depression, 20-27 = Severe depression   Psychosocial Evaluation and Intervention: Psychosocial Evaluation - 04/01/19 1531      Psychosocial Evaluation & Interventions   Interventions  Stress management education;Relaxation education;Encouraged to exercise with the program and follow exercise prescription    Comments  Patients PHQ-9 score was 9. His QOL score was 9.43%. He says he is depressed and uses alcohol for his medication. He refused any counseling and treatment.    Expected Outcomes  His PHQ_9 score and his QOL score will improve.    Continue Psychosocial Services   Follow up required by staff       Psychosocial Re-Evaluation:   Psychosocial Discharge (Final Psychosocial Re-Evaluation):    Education: Education Goals: Education classes will be provided on a weekly basis, covering required topics. Participant will state understanding/return demonstration of topics presented.  Learning Barriers/Preferences: Learning Barriers/Preferences - 04/01/19 1522      Learning Barriers/Preferences   Learning Barriers  None    Learning Preferences  Skilled Demonstration       Education Topics: How Lungs Work and Diseases: - Discuss the anatomy of the lungs and diseases that can affect the lungs, such as COPD.   Exercise: -Discuss the importance of exercise, FITT principles of exercise, normal and abnormal responses to exercise, and how to exercise safely.   Environmental Irritants: -Discuss types of environmental irritants and how to limit exposure to environmental irritants.   Meds/Inhalers and oxygen: -  Discuss respiratory medications, definition of an inhaler and oxygen, and the proper way to use an inhaler and oxygen.   Energy Saving Techniques: - Discuss methods to conserve energy and decrease shortness of breath when performing activities of daily living.    Bronchial Hygiene / Breathing Techniques: - Discuss breathing mechanics, pursed-lip breathing technique,  proper posture, effective ways to clear airways, and other functional breathing techniques   Cleaning Equipment: - Provides group verbal and written instruction about the health risks of elevated stress, cause of high stress, and healthy ways to reduce stress.   Nutrition I: Fats: - Discuss the types of cholesterol, what cholesterol does to the body, and how cholesterol levels can be controlled.   Nutrition II: Labels: -Discuss the different components of food labels and how to read food labels.   Respiratory Infections: - Discuss the signs and symptoms of respiratory infections, ways to prevent respiratory infections, and the importance of seeking medical treatment when having a respiratory infection.   Stress I: Signs and Symptoms: - Discuss the causes of stress, how stress may lead to anxiety and depression, and ways to limit stress.   Stress II: Relaxation: -Discuss relaxation techniques to limit stress.   Oxygen for Home/Travel: - Discuss how to prepare for travel when on oxygen and proper ways to transport and store oxygen to ensure safety.   Knowledge Questionnaire Score: Knowledge Questionnaire Score - 04/01/19 1522      Knowledge Questionnaire Score   Pre Score  12/18       Core Components/Risk Factors/Patient Goals at Admission: Personal Goals and Risk Factors at Admission - 04/01/19 1534      Core Components/Risk Factors/Patient Goals on Admission    Weight Management  Obesity    Tobacco Cessation  Yes    Number of packs per day  1.5    Expected Outcomes  Short Term: Will demonstrate  readiness to quit, by selecting a quit date.    Improve shortness of breath with ADL's  Yes  Intervention  Provide education, individualized exercise plan and daily activity instruction to help decrease symptoms of SOB with activities of daily living.    Expected Outcomes  Long Term: Be able to perform more ADLs without symptoms or delay the onset of symptoms;Short Term: Improve cardiorespiratory fitness to achieve a reduction of symptoms when performing ADLs    Personal Goal Other  Yes    Personal Goal  Improved balance and gait and improved breathing.    Intervention  Patient will attend pulmonary rehab 2 days/week and supplement with exercise 3 days/week at home.    Expected Outcomes  Patient will meet his personal goals.       Core Components/Risk Factors/Patient Goals Review:    Core Components/Risk Factors/Patient Goals at Discharge (Final Review):    ITP Comments:   Comments: Patient arrived for 1st visit/orientation/education at 1330. Patient was referred to CR by Dr. [name] due to Centrilobular Emphysema (J43.2). During orientation advised patient on arrival and appointment times what to wear, what to do before, during and after exercise. Reviewed attendance and class policy. Talked about inclement weather and class consultation policy. Pt is scheduled to return Cardiac Rehab on 04/01/19 at 1:30. Pt was advised to come to class 15 minutes before class starts. Patient was also given instructions on meeting with the dietician and attending the Family Structure classes. Discussed RPE/Dpysnea scales. Discussed initial THR and how to find their radial and/or carotid pulse. Discussed the initial exercise prescription and how this effects their progress. Pt is eager to get started. Patient participated in warm up stretches followed by light weights and resistance bands. Patient was able to complete 6 minute walk test. Patient complained of pain in his ankles 5/10 at check in which is chronic. His  pain remained a 5/10 through out the orientation. Patient was measured for the equipment. Discussed equipment safety with patient. Took patient pre-anthropometric measurements. Patient finished visit at 1530.

## 2019-04-01 NOTE — Progress Notes (Signed)
Cardiac/Pulmonary Rehab Medication Review by a Pharmacist  Does the patient  feel that his/her medications are working for him/her?  yes  Has the patient been experiencing any side effects to the medications prescribed?  no  Does the patient measure his/her own blood pressure or blood glucose at home?  no   Does the patient have any problems obtaining medications due to transportation or finances?   no  Understanding of regimen: good Understanding of indications: good Potential of compliance: excellent  Questions asked to Determine Patient Understanding of Medication Regimen:  1. What is the name of the medication?  2. What is the medication used for?  3. When should it be taken?  4. How much should be taken?  5. How will you take it?  6. What side effects should you report?  Understanding Defined as: Excellent: All questions above are correct Good: Questions 1-4 are correct Fair: Questions 1-2 are correct  Poor: 1 or none of the above questions are correct   Pharmacist comments: 53 yo male presented to pulmonary rehab today. We reviewed his medications. His wife is with him and helps with all of them.  He had a couple of duplicate medications: 1. He takes meloxicam daily and he had ibuprofen, informed them not to take both on the same day. 2. Breo and Trelegy: he doesn't take either one daily but if he has problems he takes the trelegy daily. I reviewed the [facility] and the reason to take daily. Also, discussed the albuterol IH as his rescue IH. He is tolerating his current regimen and feels like it is working for him. He would like to get off some of his medications if he could. He has decreased his dose of amitriptyline and may be able to wean off that. Otherwise, he takes several OTC's and wanted to continue.  No other issues identified.  Thanks for the opportunity to participate in the care of this patient,  Isac Sarna, BS [place], [place] Pharmacist Pager  [phone] 04/01/2019 3:23 PM

## 2019-04-06 ENCOUNTER — Other Ambulatory Visit: Payer: Self-pay

## 2019-04-06 ENCOUNTER — Encounter (HOSPITAL_COMMUNITY)
Admission: RE | Admit: 2019-04-06 | Discharge: 2019-04-06 | Disposition: A | Discharge status: Home / Self Care | Payer: Medicare HMO | Source: Ambulatory Visit | Attending: Pulmonary Disease | Admitting: Pulmonary Disease

## 2019-04-06 DIAGNOSIS — J432 Centrilobular emphysema: Secondary | ICD-10-CM | POA: Diagnosis not present

## 2019-04-06 NOTE — Progress Notes (Signed)
Daily Session Note  Patient Details  Name: Harold Bailey MRN: [id number] Date of Birth: 15-Aug-1966 Referring Provider:     PULMONARY REHAB OTHER RESP ORIENTATION from 04/01/2019 in [place]  Referring Provider  [name]      Encounter Date: 04/06/2019  Check In: Session Check In - 04/06/19 1330      Check-In   Supervising physician immediately available to respond to emergencies  See telemetry face sheet for immediately available ER MD    Location  [facility]    Staff Present  Russella Dar, MS, EP, [facility], Exercise Physiologist;Salmaan Patchin Zachery Conch, Exercise Physiologist    Virtual Visit  No    Medication changes reported      No    Fall or balance concerns reported     Yes    Comments  Walks with limp and is unable to bend R ankle which causes balance issues and concern.    Tobacco Cessation  No Change    Warm-up and Cool-down  Performed as group-led instruction    Resistance Training Performed  Yes    VAD Patient?  No    PAD/SET Patient?  No      Pain Assessment   Currently in Pain?  No/denies    Pain Score  0-No pain    Multiple Pain Sites  No       Capillary Blood Glucose: No results found for this or any previous visit (from the past 24 hour(s)).    Social History   Tobacco Use  Smoking Status Current Every Day Smoker  . Packs/day: 1.00  . Years: 40.00  . Pack years: 40.00  . Types: Cigarettes  Smokeless Tobacco Never Used    Goals Met:  Proper associated with RPD/PD & O2 Sat Exercise tolerated well Queuing for purse lip breathing No report of cardiac concerns or symptoms Strength training completed today  Goals Unmet:  Not Applicable  Comments: Pt able to follow exercise prescription today without complaint.  Will continue to monitor for progression. Check out 1430.    Dr. Sinda Du is Medical Director for [facility].

## 2019-04-08 ENCOUNTER — Encounter (HOSPITAL_COMMUNITY): Payer: Medicare HMO

## 2019-04-13 ENCOUNTER — Other Ambulatory Visit: Payer: Self-pay

## 2019-04-13 ENCOUNTER — Encounter (HOSPITAL_COMMUNITY)
Admission: RE | Admit: 2019-04-13 | Discharge: 2019-04-13 | Disposition: A | Discharge status: Home / Self Care | Payer: Medicare HMO | Source: Ambulatory Visit | Attending: Pulmonary Disease | Admitting: Pulmonary Disease

## 2019-04-13 DIAGNOSIS — J432 Centrilobular emphysema: Secondary | ICD-10-CM | POA: Diagnosis not present

## 2019-04-13 NOTE — Progress Notes (Signed)
Pulmonary Individual Treatment Plan  Patient Details  Name: Harold Bailey MRN: [id number] Date of Birth: May 05, 1966 Referring Provider:     PULMONARY REHAB OTHER RESP ORIENTATION from 04/01/2019 in [place]  Referring Provider  [name]      Initial Encounter Date:    PULMONARY REHAB OTHER RESP ORIENTATION from 04/01/2019 in [place]  Date  04/01/19      Visit Diagnosis: Centrilobular emphysema ([place])   Patient's Home Medications on Admission:   Current Outpatient Medications:  .  albuterol (PROVENTIL HFA;VENTOLIN HFA) 108 (90 Base) MCG/ACT inhaler, Inhale 2 puffs into the lungs every 6 (six) hours as needed for wheezing or shortness of breath., Disp: 1 Inhaler, Rfl: 6 .  Alfalfa 500 MG TABS, Take 1,000 mg by mouth 2 (two) times a day., Disp: , Rfl:  .  ALPRAZolam (XANAX) 1 MG tablet, Take 1 mg by mouth 3 (three) times daily as needed for anxiety. , Disp: , Rfl:  .  amitriptyline (ELAVIL) 25 MG tablet, Take 1 tablet (25 mg total) by mouth at bedtime., Disp: 30 tablet, Rfl: 3 .  ascorbic acid (VITAMIN C) 1000 MG tablet, Take 1,000 mg by mouth daily., Disp: , Rfl:  .  aspirin EC 81 MG tablet, Take 81 mg by mouth daily., Disp: , Rfl:  .  BREO ELLIPTA 200-25 MCG/INH AEPB, Inhale 1 puff into the lungs daily., Disp: , Rfl:  .  divalproex (DEPAKOTE ER) 500 MG 24 hr tablet, TAKE 2 TABLETS(1000 MG) BY MOUTH DAILY (Patient taking differently: Take 500 mg by mouth 2 (two) times a day. ), Disp: 180 tablet, Rfl: 3 .  Fluticasone-Umeclidin-Vilant (TRELEGY ELLIPTA) 100-62.5-25 MCG/INH AEPB, Inhale 1 puff into the lungs daily., Disp: 1 each, Rfl: 5 .  gabapentin (NEURONTIN) 300 MG capsule, TAKE 1 CAPSULE(300 MG) BY MOUTH TWICE DAILY (Patient taking differently: Take 300 mg by mouth 2 (two) times daily. ), Disp: 60 capsule, Rfl: 5 .  ibuprofen (ADVIL) 800 MG tablet, Take 800 mg by mouth every 8 (eight) hours as needed for mild pain or moderate pain. Do not  take with meloxicam, Disp: , Rfl:  .  LECITHIN PO, Take 1 capsule by mouth daily., Disp: , Rfl:  .  meloxicam (MOBIC) 7.5 MG tablet, Take 7.5 mg by mouth daily., Disp: , Rfl:  .  Multiple Vitamins-Minerals (MULTIVITAMIN WITH MINERALS) tablet, Take 1 tablet by mouth daily., Disp: , Rfl:  .  Naphazoline-Pheniramine (OPCON-A) 0.027-0.315 % SOLN, Place 1 drop into both eyes every 8 (eight) hours as needed (red, irritated eyes)., Disp: , Rfl:  .  omeprazole (PRILOSEC) 40 MG capsule, Take 40 mg by mouth daily. , Disp: , Rfl:  .  Oxcarbazepine (TRILEPTAL) 300 MG tablet, TAKE 1 TABLET(300 MG) BY MOUTH TWICE DAILY (Patient taking differently: Take 300 mg by mouth 2 (two) times daily. ), Disp: 180 tablet, Rfl: 1 .  OXYGEN, Inhale into the lungs. At home oxygen, Disp: , Rfl:  .  predniSONE (DELTASONE) 50 MG tablet, Take one tablet by mouth once daily for 5 days, Disp: 5 tablet, Rfl: 0 .  rosuvastatin (CRESTOR) 10 MG tablet, Take 10 mg by mouth daily at 6 PM., Disp: , Rfl:  .  VOLTAREN 1 % GEL, APPLY 2 TO 4 GRAMS EXTERNALLY TO THE AFFECTED AREA TWICE DAILY (Patient taking differently: Apply 2 g topically 2 (two) times daily as needed (pain). ), Disp: 500 g, Rfl: 0  Past Medical History: Past Medical History:  Diagnosis Date  .  Anxiety   . Arthritis    Left ankle, hand  . COPD (chronic obstructive pulmonary disease) ([place])   . COPD (chronic obstructive pulmonary disease) ([place])   . Drug abuse ([place])   . DVT (deep venous thrombosis) ([facility])    Bilateral legs  . Gait disturbance   . GERD (gastroesophageal reflux disease)   . 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  Marland Kitchen History of ankle surgery   . History of brain tumor   . Hyperlipidemia   . Hypertension   . 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 Memory loss   . On home oxygen therapy 08/26/2018   at night while lying down  . 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 Pneumonia   . Polycythemia   . Pre-diabetes   . Seizures ([place]) 11/19/2013   none since 2016  . Shortness of breath    Hx of smoking.  Marland Kitchen SVT (supraventricular tachycardia) ([place])   . Wears dentures   . Wears glasses     Tobacco Use: Social History   Tobacco Use  Smoking Status Current Every Day Smoker  . Packs/day: 1.00  . Years: 40.00  . Pack years: 40.00  . Types: Cigarettes  Smokeless Tobacco Never Used    Labs: Recent Review Flowsheet Data    Labs for ITP Cardiac and Pulmonary Rehab Latest Ref Rng & Units 05/20/2017 05/21/2017 05/24/2017 05/28/2017 06/10/2017   Cholestrol 0 - 200 mg/dL - - - - -   LDLCALC 0 - 99 mg/dL - - - - -   HDL >39 mg/dL - - - - -   Trlycerides <150 mg/dL - 205(H) 219(H) - -   Hemoglobin A1c 4.8 - 5.6 % - - - 6.3(H) 6.3(H)   PHART 7.350 - 7.450 7.476(H) 7.488(H) - - -   PCO2ART 32.0 - 48.0 mmHg 45.1 38.2 - - -   HCO3 20.0 - 28.0 mmol/L 33.3(H) 29.0(H) - - -   TCO2 22 - 32 mmol/L 35(H) 30 - - -   O2SAT % 91.0 95.0 - - -      Capillary Blood Glucose: Lab Results  Component Value Date   GLUCAP 172 (H) 05/27/2017   GLUCAP 103 (H) 05/27/2017   GLUCAP 92 05/27/2017   GLUCAP 105 (H) 05/27/2017   GLUCAP 127 (H) 05/26/2017     Pulmonary Assessment Scores: Pulmonary Assessment Scores    Row Name 04/01/19 1524         ADL UCSD   ADL Phase  Entry     SOB Score total  62     Rest  4     Walk  6     Stairs  2     Bath  3     Dress  3     Shop  3       CAT Score   CAT Score  24       mMRC Score   mMRC Score  3       UCSD: Self-administered rating of dyspnea associated with activities of daily living (ADLs) 6-point scale (0 = "not at all" to 5 = "maximal or unable to do because of breathlessness")  Scoring Scores range from 0 to 120.  Minimally important difference is 5 units  CAT: CAT can identify the health impairment of COPD patients and is better correlated with disease progression.  CAT has a scoring range of zero to 40. The  CAT score is classified into four groups of low (less than 10), medium (10 - 20), high (21-30) and very high (31-40) based on the impact level of disease on health status. A CAT score over 10 suggests significant symptoms.  A worsening CAT score could be explained by an exacerbation, poor medication adherence, poor inhaler technique, or progression of COPD or comorbid conditions.  CAT MCID is 2 points  mMRC: mMRC (Modified Medical Research Council) Dyspnea Scale is used to assess the degree of baseline functional disability in patients of respiratory disease due to dyspnea. No minimal important difference is established. A decrease in score of 1 point or greater is considered a positive change.   Pulmonary Function Assessment: Pulmonary Function Assessment - 04/01/19 1548      Pulmonary Function Tests   FVC%  2.4 %    FEV1%  1.4 %    FEV1/FVC Ratio  58      Breath   Shortness of Breath  Yes       Exercise Target Goals: Exercise Program Goal: Individual exercise prescription set using results from initial 6 min walk test and THRR while considering  patient's activity barriers and safety.   Exercise Prescription Goal: Initial exercise prescription builds to 30-45 minutes a day of aerobic activity, 2-3 days per week.  Home exercise guidelines will be given to patient during program as part of exercise prescription that the participant will acknowledge.  Activity Barriers & Risk Stratification: Activity Barriers & Cardiac Risk Stratification - 04/01/19 1518      Activity Barriers & Cardiac Risk Stratification   Activity Barriers  Shortness of Breath;Joint Problems   bilateral ankle replacement   Cardiac Risk Stratification  High       6 Minute Walk: 6 Minute Walk    Row Name 04/01/19 1517         6 Minute Walk   Phase  Initial     Distance  700 feet     Walk Time  6 minutes     # of Rest Breaks  0     MPH  1.32     METS  2.01     RPE  12     Perceived Dyspnea   14      VO2 Peak  9.75     Symptoms  Yes (comment)     Comments  5/10 bilateral ankle pain     Resting HR  100 bpm     Resting BP  140/80     Resting Oxygen Saturation   91 %     Exercise Oxygen Saturation  during 6 min walk  88 %     Max Ex. HR  107 bpm     Max Ex. BP  160/78     2 Minute Post BP  138/80        Oxygen Initial Assessment: Oxygen Initial Assessment - 04/01/19 1523      Home Oxygen   Home Oxygen Device  Home Concentrator    Sleep Oxygen Prescription  Continuous    Liters per minute  4    Home Exercise Oxygen Prescription  None    Home at Rest Exercise Oxygen Prescription  --   As needed at 4 L/min   Compliance with Home Oxygen Use  Yes      Initial 6 min Walk   Oxygen Used  None      Program Oxygen Prescription  Program Oxygen Prescription  --   Will use if O2 sat >88% at 2 to 4 L/Min     Intervention   Short Term Goals  To learn and exhibit compliance with exercise, home and travel O2 prescription;To learn and understand importance of monitoring SPO2 with pulse oximeter and demonstrate accurate use of the pulse oximeter.;To learn and understand importance of maintaining oxygen saturations>88%;To learn and demonstrate proper pursed lip breathing techniques or other breathing techniques.    Long  Term Goals  Exhibits compliance with exercise, home and travel O2 prescription;Verbalizes importance of monitoring SPO2 with pulse oximeter and return demonstration;Maintenance of O2 saturations>88%;Exhibits proper breathing techniques, such as pursed lip breathing or other method taught during program session       Oxygen Re-Evaluation:   Oxygen Discharge (Final Oxygen Re-Evaluation):   Initial Exercise Prescription: Initial Exercise Prescription - 04/01/19 1500      Date of Initial Exercise RX and Referring Provider   Date  04/01/19    Referring Provider  [name]    Expected Discharge Date  07/02/19      NuStep   Level  1    SPM  80    Minutes  17    METs  1.8       Arm Ergometer   Level  1    Watts  12    RPM  50    Minutes  17    METs  1.8      Prescription Details   Frequency (times per week)  2    Duration  Progress to 30 minutes of continuous aerobic without signs/symptoms of physical distress      Intensity   THRR 40-80% of Max Heartrate  [phone]    Ratings of Perceived Exertion  11-13    Perceived Dyspnea  0-4      Progression   Progression  Continue to progress workloads to maintain intensity without signs/symptoms of physical distress.      Resistance Training   Training Prescription  Yes    Weight  1    Reps  10-15       Perform Capillary Blood Glucose checks as needed.  Exercise Prescription Changes:  Exercise Prescription Changes    Row Name 04/01/19 1500             Home Exercise Plan   Plans to continue exercise at  Home (comment)       Frequency  Add 3 additional days to program exercise sessions.       Initial Home Exercises Provided  04/01/19          Exercise Comments:  Exercise Comments    Row Name 04/13/19 1256           Exercise Comments  Harold Bailey has attended 2 sessions so far. He is really wanting to improve his walking ability so we are doing everything we can to aid him in that, but he is unable to walk on the treadmill. We will continue to monitor his progress and adjust his workouts accordingly.          Exercise Goals and Review:  Exercise Goals    Row Name 04/01/19 1519             Exercise Goals   Increase Physical Activity  Yes       Intervention  Develop an individualized exercise prescription for aerobic and resistive training based on initial evaluation findings, risk stratification, comorbidities and participant's personal goals.  Expected Outcomes  Short Term: Attend rehab on a regular basis to increase amount of physical activity.;Long Term: Add in home exercise to make exercise part of routine and to increase amount of physical activity.;Long Term: Exercising  regularly at least 3-5 days a week.       Increase Strength and Stamina  Yes       Intervention  Develop an individualized exercise prescription for aerobic and resistive training based on initial evaluation findings, risk stratification, comorbidities and participant's personal goals.       Expected Outcomes  Short Term: Increase workloads from initial exercise prescription for resistance, speed, and METs.;Short Term: Perform resistance training exercises routinely during rehab and add in resistance training at home;Long Term: Improve cardiorespiratory fitness, muscular endurance and strength as measured by increased METs and functional capacity (6MWT)       Able to understand and use rate of perceived exertion (RPE) scale  Yes       Intervention  Provide education and explanation on how to use RPE scale       Expected Outcomes  Short Term: Able to use RPE daily in rehab to express subjective intensity level;Long Term:  Able to use RPE to guide intensity level when exercising independently       Able to understand and use Dyspnea scale  Yes       Intervention  Provide education and explanation on how to use Dyspnea scale       Expected Outcomes  Short Term: Able to use Dyspnea scale daily in rehab to express subjective sense of shortness of breath during exertion;Long Term: Able to use Dyspnea scale to guide intensity level when exercising independently       Knowledge and understanding of Target Heart Rate Range (THRR)  Yes       Intervention  Provide education and explanation of THRR including how the numbers were predicted and where they are located for reference       Expected Outcomes  Short Term: Able to state/look up THRR;Long Term: Able to use THRR to govern intensity when exercising independently;Short Term: Able to use daily as guideline for intensity in rehab       Able to check pulse independently  Yes       Intervention  Provide education and demonstration on how to check pulse in carotid  and radial arteries.;Review the importance of being able to check your own pulse for safety during independent exercise       Expected Outcomes  Short Term: Able to explain why pulse checking is important during independent exercise;Long Term: Able to check pulse independently and accurately       Understanding of Exercise Prescription  Yes       Intervention  Provide education, explanation, and written materials on patient's individual exercise prescription       Expected Outcomes  Short Term: Able to explain program exercise prescription;Long Term: Able to explain home exercise prescription to exercise independently          Exercise Goals Re-Evaluation : Exercise Goals Re-Evaluation    Row Name 04/13/19 1255             Exercise Goal Re-Evaluation   Exercise Goals Review  Increase Physical Activity;Increase Strength and Stamina;Able to understand and use rate of perceived exertion (RPE) scale;Able to understand and use Dyspnea scale;Knowledge and understanding of Target Heart Rate Range (THRR);Able to check pulse independently;Understanding of Exercise Prescription       Comments  Pt.  is brand new to the program. He has completed one exercise session. He is limited in what he is able to do so we are still working to solidify his exercise prescription.       Expected Outcomes  Short: increase overall activity level. Long: improve balance and walking abilities.          Discharge Exercise Prescription (Final Exercise Prescription Changes): Exercise Prescription Changes - 04/01/19 1500      Home Exercise Plan   Plans to continue exercise at  Home (comment)    Frequency  Add 3 additional days to program exercise sessions.    Initial Home Exercises Provided  04/01/19       Nutrition:  Target Goals: Understanding of nutrition guidelines, daily intake of sodium 1500mg , cholesterol 200mg , calories 30% from fat and 7% or less from saturated fats, daily to have 5 or more servings of  fruits and vegetables.  Biometrics: Pre Biometrics - 04/01/19 1520      Pre Biometrics   Height  6' (1.829 m)    Weight  108 kg    Waist Circumference  44 inches    Hip Circumference  48 inches    Waist to Hip Ratio  0.92 %    BMI (Calculated)  32.28    Triceps Skinfold  9 mm    % Body Fat  28.1 %    Grip Strength  7.1 kg    Flexibility  0 in    Single Leg Stand  0 seconds        Nutrition Therapy Plan and Nutrition Goals:   Nutrition Assessments: Nutrition Assessments - 04/01/19 1521      MEDFICTS Scores   Pre Score  53       Nutrition Goals Re-Evaluation:   Nutrition Goals Discharge (Final Nutrition Goals Re-Evaluation):   Psychosocial: Target Goals: Acknowledge presence or absence of significant depression and/or stress, maximize coping skills, provide positive support system. Participant is able to verbalize types and ability to use techniques and skills needed for reducing stress and depression.  Initial Review & Psychosocial Screening: Initial Psych Review & Screening - 04/01/19 1526      Initial Review   Current issues with  Current Depression      Family Dynamics   Good Support System?  Yes    Comments  Patient says he dranks 6 to 12 beers/day. He says he is depressed due to his chronic health conditions.      Barriers   Psychosocial barriers to participate in program  Psychosocial barriers identified (see note)      Screening Interventions   Interventions  Encouraged to exercise;To provide support and resources with identified psychosocial needs;Provide feedback about the scores to participant    Expected Outcomes  Short Term goal: Utilizing psychosocial counselor, staff and physician to assist with identification of specific Stressors or current issues interfering with healing process. Setting desired goal for each stressor or current issue identified.;Short Term goal: Identification and review with participant of any Quality of Life or Depression  concerns found by scoring the questionnaire.;Long Term goal: The participant improves quality of Life and PHQ9 Scores as seen by post scores and/or verbalization of changes       Quality of Life Scores: Quality of Life - 04/01/19 1521      Quality of Life   Select  Quality of Life      Quality of Life Scores   Health/Function Pre  4.53 %    Socioeconomic Pre  8.57 %    Psych/Spiritual Pre  17.14 %    Family Pre  15.6 %    GLOBAL Pre  9.59 %      Scores of 19 and below usually indicate a poorer quality of life in these areas.  A difference of  2-3 points is a clinically meaningful difference.  A difference of 2-3 points in the total score of the Quality of Life Index has been associated with significant improvement in overall quality of life, self-image, physical symptoms, and general health in studies assessing change in quality of life.   PHQ-9: Recent Review Flowsheet Data    Depression screen [facility] 2/9 04/01/2019   Decreased Interest 1   Down, Depressed, Hopeless 1   PHQ - 2 Score 2   Altered sleeping 1   Tired, decreased energy 1   Change in appetite 1   Feeling bad or failure about yourself  1   Trouble concentrating 1   Moving slowly or fidgety/restless 2   Suicidal thoughts 0   PHQ-9 Score 9   Difficult doing work/chores Somewhat difficult     Interpretation of Total Score  Total Score Depression Severity:  1-4 = Minimal depression, 5-9 = Mild depression, 10-14 = Moderate depression, 15-19 = Moderately severe depression, 20-27 = Severe depression   Psychosocial Evaluation and Intervention: Psychosocial Evaluation - 04/01/19 1531      Psychosocial Evaluation & Interventions   Interventions  Stress management education;Relaxation education;Encouraged to exercise with the program and follow exercise prescription    Comments  Patients PHQ-9 score was 9. His QOL score was 9.43%. He says he is depressed and uses alcohol for his medication. He refused any counseling and  treatment.    Expected Outcomes  His PHQ_9 score and his QOL score will improve.    Continue Psychosocial Services   Follow up required by staff       Psychosocial Re-Evaluation:   Psychosocial Discharge (Final Psychosocial Re-Evaluation):    Education: Education Goals: Education classes will be provided on a weekly basis, covering required topics. Participant will state understanding/return demonstration of topics presented.  Learning Barriers/Preferences: Learning Barriers/Preferences - 04/01/19 1522      Learning Barriers/Preferences   Learning Barriers  None    Learning Preferences  Skilled Demonstration       Education Topics: How Lungs Work and Diseases: - Discuss the anatomy of the lungs and diseases that can affect the lungs, such as COPD.   Exercise: -Discuss the importance of exercise, FITT principles of exercise, normal and abnormal responses to exercise, and how to exercise safely.   Environmental Irritants: -Discuss types of environmental irritants and how to limit exposure to environmental irritants.   Meds/Inhalers and oxygen: - Discuss respiratory medications, definition of an inhaler and oxygen, and the proper way to use an inhaler and oxygen.   Energy Saving Techniques: - Discuss methods to conserve energy and decrease shortness of breath when performing activities of daily living.    Bronchial Hygiene / Breathing Techniques: - Discuss breathing mechanics, pursed-lip breathing technique,  proper posture, effective ways to clear airways, and other functional breathing techniques   Cleaning Equipment: - Provides group verbal and written instruction about the health risks of elevated stress, cause of high stress, and healthy ways to reduce stress.   Nutrition I: Fats: - Discuss the types of cholesterol, what cholesterol does to the body, and how cholesterol levels can be controlled.   Nutrition II: Labels: -Discuss the different components of  food  labels and how to read food labels.   Respiratory Infections: - Discuss the signs and symptoms of respiratory infections, ways to prevent respiratory infections, and the importance of seeking medical treatment when having a respiratory infection.   Stress I: Signs and Symptoms: - Discuss the causes of stress, how stress may lead to anxiety and depression, and ways to limit stress.   Stress II: Relaxation: -Discuss relaxation techniques to limit stress.   Oxygen for Home/Travel: - Discuss how to prepare for travel when on oxygen and proper ways to transport and store oxygen to ensure safety.   Knowledge Questionnaire Score: Knowledge Questionnaire Score - 04/01/19 1522      Knowledge Questionnaire Score   Pre Score  12/18       Core Components/Risk Factors/Patient Goals at Admission: Personal Goals and Risk Factors at Admission - 04/01/19 1534      Core Components/Risk Factors/Patient Goals on Admission    Weight Management  Obesity    Tobacco Cessation  Yes    Number of packs per day  1.5    Expected Outcomes  Short Term: Will demonstrate readiness to quit, by selecting a quit date.    Improve shortness of breath with ADL's  Yes    Intervention  Provide education, individualized exercise plan and daily activity instruction to help decrease symptoms of SOB with activities of daily living.    Expected Outcomes  Long Term: Be able to perform more ADLs without symptoms or delay the onset of symptoms;Short Term: Improve cardiorespiratory fitness to achieve a reduction of symptoms when performing ADLs    Personal Goal Other  Yes    Personal Goal  Improved balance and gait and improved breathing.    Intervention  Patient will attend pulmonary rehab 2 days/week and supplement with exercise 3 days/week at home.    Expected Outcomes  Patient will meet his personal goals.       Core Components/Risk Factors/Patient Goals Review:    Core Components/Risk Factors/Patient Goals  at Discharge (Final Review):    ITP Comments: ITP Comments    Row Name 04/13/19 1350           ITP Comments  Patient is new to the program. He has completed 3 sessions. Will continue to monitor for progress.          Comments: ITP REVIEW Patient is new to the program. He has completed 3 sessions. Will continue to monitor for progress.

## 2019-04-13 NOTE — Progress Notes (Signed)
Daily Session Note  Patient Details  Name: Harold Bailey MRN: [id number] Date of Birth: 09/04/1966 Referring Provider:     PULMONARY REHAB OTHER RESP ORIENTATION from 04/01/2019 in [place]  Referring Provider  [name]      Encounter Date: 04/13/2019  Check In: Session Check In - 04/13/19 1330      Check-In   Supervising physician immediately available to respond to emergencies  See telemetry face sheet for immediately available ER MD    Location  [facility]    Staff Present  Russella Dar, MS, EP, [facility], Exercise Physiologist;Vaudie Engebretsen Zachery Conch, Exercise Physiologist    Virtual Visit  No    Medication changes reported      No    Fall or balance concerns reported     Yes    Comments  ankle does not bend    Tobacco Cessation  No Change    Warm-up and Cool-down  Performed as group-led instruction    Resistance Training Performed  Yes    VAD Patient?  No    PAD/SET Patient?  No      Pain Assessment   Currently in Pain?  No/denies    Pain Score  5     Pain Location  Ankle    Pain Orientation  Right;Left    Pain Descriptors / Indicators  Aching    Pain Type  Chronic pain    Pain Onset  More than a month ago    Pain Frequency  Constant    Multiple Pain Sites  No       Capillary Blood Glucose: No results found for this or any previous visit (from the past 24 hour(s)).    Social History   Tobacco Use  Smoking Status Current Every Day Smoker  . Packs/day: 1.00  . Years: 40.00  . Pack years: 40.00  . Types: Cigarettes  Smokeless Tobacco Never Used    Goals Met:  Proper associated with RPD/PD & O2 Sat Independence with exercise equipment Using PLB without cueing & demonstrates good technique Exercise tolerated well No report of cardiac concerns or symptoms Strength training completed today  Goals Unmet:  not applicable   Comments: Pt able to follow exercise prescription today without complaint.  Will continue to monitor for  progression. Check out 1430.   Dr. Sinda Du is Medical Director for [facility] Pulmonary Rehab.

## 2019-04-15 ENCOUNTER — Other Ambulatory Visit: Payer: Self-pay

## 2019-04-15 ENCOUNTER — Encounter (HOSPITAL_COMMUNITY)
Admission: RE | Admit: 2019-04-15 | Discharge: 2019-04-15 | Disposition: A | Discharge status: Home / Self Care | Payer: Medicare HMO | Source: Ambulatory Visit | Attending: Pulmonary Disease | Admitting: Pulmonary Disease

## 2019-04-15 DIAGNOSIS — J432 Centrilobular emphysema: Secondary | ICD-10-CM | POA: Diagnosis not present

## 2019-04-15 NOTE — Progress Notes (Signed)
Daily Session Note  Patient Details  Name: Harold Bailey MRN: [id number] Date of Birth: 08-Dec-1965 Referring Provider:     PULMONARY REHAB OTHER RESP ORIENTATION from 04/01/2019 in [place]  Referring Provider  [name]      Encounter Date: 04/15/2019  Check In: Session Check In - 04/15/19 1330      Check-In   Supervising physician immediately available to respond to emergencies  See telemetry face sheet for immediately available MD    Location  [facility]    Staff Present  Russella Dar, MS, EP, [facility], Exercise Physiologist;Amanda Ballard, Exercise Physiologist;Zahari Fazzino Wynetta Emery, RN, BSN    Virtual Visit  No    Medication changes reported      No    Fall or balance concerns reported     Yes    Comments  Patient has history of falls.    Tobacco Cessation  No Change    Warm-up and Cool-down  Performed as group-led instruction    Resistance Training Performed  Yes    VAD Patient?  No    PAD/SET Patient?  No      Pain Assessment   Currently in Pain?  No/denies    Pain Score  0-No pain    Multiple Pain Sites  No       Capillary Blood Glucose: No results found for this or any previous visit (from the past 24 hour(s)).    Social History   Tobacco Use  Smoking Status Current Every Day Smoker  . Packs/day: 1.00  . Years: 40.00  . Pack years: 40.00  . Types: Cigarettes  Smokeless Tobacco Never Used    Goals Met:  Proper associated with RPD/PD & O2 Sat Independence with exercise equipment Improved SOB with ADL's Using PLB without cueing & demonstrates good technique Exercise tolerated well Personal goals reviewed No report of cardiac concerns or symptoms Strength training completed today  Goals Unmet:  Not Applicable  Comments: Pt able to follow exercise prescription today without complaint.  Will continue to monitor for progression. Check out 1430.   Dr. Sinda Du is Medical Director for [facility] Pulmonary Rehab.

## 2019-04-19 ENCOUNTER — Other Ambulatory Visit: Payer: Self-pay

## 2019-04-19 ENCOUNTER — Telehealth: Payer: Self-pay | Admitting: Family

## 2019-04-19 DIAGNOSIS — M5442 Lumbago with sciatica, left side: Secondary | ICD-10-CM

## 2019-04-19 NOTE — Telephone Encounter (Signed)
Pt was seen for LBP with rad s/s and given an rx for Prednisone and states that he needs something stronger that it is not helping the pain. What would you like to do?

## 2019-04-19 NOTE — Telephone Encounter (Signed)
Lets set him up for a mri of his lumbar spine and follow up with [facility] for evaluation for esi

## 2019-04-19 NOTE — Telephone Encounter (Signed)
I called and sw pt to advise of message below. Pt agreeable to plan and MRI referral made with follow up with [facility] to discuss ESI. To call with any questions.

## 2019-04-19 NOTE — Telephone Encounter (Signed)
Patient called and stated that he thinks he need a stronger biotic. The ones he;s taking are not helping.  Please call patient @ [phone]

## 2019-04-20 ENCOUNTER — Encounter (HOSPITAL_COMMUNITY)
Admission: RE | Admit: 2019-04-20 | Discharge: 2019-04-20 | Disposition: A | Discharge status: Home / Self Care | Payer: Medicare HMO | Source: Ambulatory Visit | Attending: Pulmonary Disease | Admitting: Pulmonary Disease

## 2019-04-20 ENCOUNTER — Other Ambulatory Visit: Payer: Self-pay

## 2019-04-20 DIAGNOSIS — J432 Centrilobular emphysema: Secondary | ICD-10-CM

## 2019-04-20 NOTE — Progress Notes (Signed)
Daily Session Note  Patient Details  Name: Harold Bailey MRN: [id number] Date of Birth: 1965-10-24 Referring Provider:     PULMONARY REHAB OTHER RESP ORIENTATION from 04/01/2019 in [place]  Referring Provider  [name]      Encounter Date: 04/20/2019  Check In: Session Check In - 04/20/19 1330      Check-In   Supervising physician immediately available to respond to emergencies  See telemetry face sheet for immediately available MD    Location  [facility]    Staff Present  Russella Dar, MS, EP, [facility], Exercise Physiologist;Ottie Tillery Zachery Conch, Exercise Physiologist    Virtual Visit  No    Medication changes reported      No    Fall or balance concerns reported     Yes    Comments  walks with drop foot, L leg fusion    Tobacco Cessation  Use Decreased   states he has gone from 2 ppd to 2 pp3d   Warm-up and Cool-down  Performed as group-led instruction    Resistance Training Performed  Yes    VAD Patient?  No    PAD/SET Patient?  No      PAD/SET Patient   Completed foot check today?  No      Pain Assessment   Currently in Pain?  No/denies    Pain Score  0-No pain    Multiple Pain Sites  No       Capillary Blood Glucose: No results found for this or any previous visit (from the past 24 hour(s)).    Social History   Tobacco Use  Smoking Status Current Every Day Smoker  . Packs/day: 1.00  . Years: 40.00  . Pack years: 40.00  . Types: Cigarettes  Smokeless Tobacco Never Used    Goals Met:  Proper associated with RPD/PD & O2 Sat Independence with exercise equipment Exercise tolerated well Personal goals reviewed No report of cardiac concerns or symptoms Strength training completed today  Goals Unmet:  Not Applicable  Comments: Pt able to follow exercise prescription today without complaint.  Will continue to monitor for progression. Check out 1430.   Dr. Sinda Du is Medical Director for [facility] Pulmonary  Rehab.

## 2019-04-22 ENCOUNTER — Other Ambulatory Visit: Payer: Self-pay

## 2019-04-22 ENCOUNTER — Encounter (HOSPITAL_COMMUNITY)
Admission: RE | Admit: 2019-04-22 | Discharge: 2019-04-22 | Disposition: A | Discharge status: Home / Self Care | Payer: Medicare HMO | Source: Ambulatory Visit | Attending: Pulmonary Disease | Admitting: Pulmonary Disease

## 2019-04-22 DIAGNOSIS — J432 Centrilobular emphysema: Secondary | ICD-10-CM

## 2019-04-22 NOTE — Progress Notes (Signed)
Daily Session Note  Patient Details  Name: Harold Bailey MRN: [id number] Date of Birth: 05-Jan-1966 Referring Provider:     PULMONARY REHAB OTHER RESP ORIENTATION from 04/01/2019 in [place]  Referring Provider  [name]      Encounter Date: 04/22/2019  Check In: Session Check In - 04/22/19 1330      Check-In   Supervising physician immediately available to respond to emergencies  See telemetry face sheet for immediately available MD    Location  [facility]    Staff Present  Russella Dar, MS, EP, [facility], Exercise Physiologist;Addysin Porco Zachery Conch, Exercise Physiologist    Virtual Visit  No    Medication changes reported      No    Fall or balance concerns reported     Yes    Tobacco Cessation  No Change    Warm-up and Cool-down  Performed as group-led instruction    Resistance Training Performed  Yes    VAD Patient?  No    PAD/SET Patient?  No      PAD/SET Patient   Completed foot check today?  No    Open wounds to report?  No      Pain Assessment   Currently in Pain?  No/denies    Pain Score  0-No pain    Multiple Pain Sites  No       Capillary Blood Glucose: No results found for this or any previous visit (from the past 24 hour(s)).    Social History   Tobacco Use  Smoking Status Current Every Day Smoker  . Packs/day: 1.00  . Years: 40.00  . Pack years: 40.00  . Types: Cigarettes  Smokeless Tobacco Never Used    Goals Met:  Proper associated with RPD/PD & O2 Sat Exercise tolerated well Personal goals reviewed No report of cardiac concerns or symptoms Strength training completed today  Goals Unmet:  Not Applicable  Comments: Pt able to follow exercise prescription today without complaint.  Will continue to monitor for progression. Check out 1430.   Dr. Sinda Du is Medical Director for [facility] Pulmonary Rehab.

## 2019-04-26 ENCOUNTER — Other Ambulatory Visit (INDEPENDENT_AMBULATORY_CARE_PROVIDER_SITE_OTHER): Payer: Self-pay | Admitting: Orthopedic Surgery

## 2019-04-26 NOTE — Telephone Encounter (Signed)
Dr Duda please advise, thank you. 

## 2019-04-27 ENCOUNTER — Other Ambulatory Visit: Payer: Self-pay

## 2019-04-27 ENCOUNTER — Encounter (HOSPITAL_COMMUNITY)
Admission: RE | Admit: 2019-04-27 | Discharge: 2019-04-27 | Disposition: A | Discharge status: Home / Self Care | Payer: Medicare HMO | Source: Ambulatory Visit | Attending: Pulmonary Disease | Admitting: Pulmonary Disease

## 2019-04-27 DIAGNOSIS — J432 Centrilobular emphysema: Secondary | ICD-10-CM | POA: Diagnosis present

## 2019-04-27 NOTE — Progress Notes (Signed)
Daily Session Note  Patient Details  Name: Harold Bailey MRN: [id number] Date of Birth: Jun 06, 1966 Referring Provider:     PULMONARY REHAB OTHER RESP ORIENTATION from 04/01/2019 in [place]  Referring Provider  [name]      Encounter Date: 04/27/2019  Check In: Session Check In - 04/27/19 1330      Check-In   Supervising physician immediately available to respond to emergencies  See telemetry face sheet for immediately available MD    Location  [facility]    Staff Present  Russella Dar, MS, EP, [facility], Exercise Physiologist;Oneal Biglow, Exercise Physiologist;Debra Wynetta Emery, RN, BSN    Virtual Visit  No    Medication changes reported      No    Fall or balance concerns reported     Yes    Tobacco Cessation  No Change    Warm-up and Cool-down  Performed as group-led instruction    Resistance Training Performed  Yes    VAD Patient?  No    PAD/SET Patient?  No      PAD/SET Patient   Completed foot check today?  No    Open wounds to report?  No      Pain Assessment   Currently in Pain?  No/denies    Pain Score  0-No pain    Multiple Pain Sites  No       Capillary Blood Glucose: No results found for this or any previous visit (from the past 24 hour(s)).    Social History   Tobacco Use  Smoking Status Current Every Day Smoker  . Packs/day: 1.00  . Years: 40.00  . Pack years: 40.00  . Types: Cigarettes  Smokeless Tobacco Never Used    Goals Met:  Proper associated with RPD/PD & O2 Sat Independence with exercise equipment Using PLB without cueing & demonstrates good technique Exercise tolerated well No report of cardiac concerns or symptoms Strength training completed today  Goals Unmet:  Not Applicable  Comments: Pt able to follow exercise prescription today without complaint.  Will continue to monitor for progression. Check out 1430.   Dr. Sinda Du is Medical Director for [facility] Pulmonary Rehab.

## 2019-04-29 ENCOUNTER — Other Ambulatory Visit: Payer: Self-pay

## 2019-04-29 ENCOUNTER — Encounter (HOSPITAL_COMMUNITY)
Admission: RE | Admit: 2019-04-29 | Discharge: 2019-04-29 | Disposition: A | Discharge status: Home / Self Care | Payer: Medicare HMO | Source: Ambulatory Visit | Attending: Pulmonary Disease | Admitting: Pulmonary Disease

## 2019-04-29 DIAGNOSIS — J432 Centrilobular emphysema: Secondary | ICD-10-CM | POA: Diagnosis not present

## 2019-04-29 NOTE — Progress Notes (Signed)
Daily Session Note  Patient Details  Name: Harold Bailey MRN: [id number] Date of Birth: 01/26/1966 Referring Provider:     PULMONARY REHAB OTHER RESP ORIENTATION from 04/01/2019 in [place]  Referring Provider  [name]      Encounter Date: 04/29/2019  Check In: Session Check In - 04/29/19 1330      Check-In   Location  [facility]    Staff Present  Diane Angelina Pih, MS, EP, [facility], Exercise Physiologist;Simra Fiebig, Exercise Physiologist;Debra Wynetta Emery, RN, BSN    Virtual Visit  No    Medication changes reported      No    Fall or balance concerns reported     Yes    Comments  walks with drop foot, L leg fusion    Tobacco Cessation  No Change    Warm-up and Cool-down  Performed as group-led instruction    Resistance Training Performed  Yes    VAD Patient?  No    PAD/SET Patient?  No      PAD/SET Patient   Completed foot check today?  No    Open wounds to report?  No      Pain Assessment   Currently in Pain?  No/denies    Multiple Pain Sites  No       Capillary Blood Glucose: No results found for this or any previous visit (from the past 24 hour(s)).    Social History   Tobacco Use  Smoking Status Current Every Day Smoker  . Packs/day: 1.00  . Years: 40.00  . Pack years: 40.00  . Types: Cigarettes  Smokeless Tobacco Never Used    Goals Met:  Proper associated with RPD/PD & O2 Sat Independence with exercise equipment Using PLB without cueing & demonstrates good technique Exercise tolerated well Queuing for purse lip breathing No report of cardiac concerns or symptoms Strength training completed today  Goals Unmet:  Not Applicable  Comments: Pt able to follow exercise prescription today without complaint.  Will continue to monitor for progression. Check out 1430.   Dr. Sinda Du is Medical Director for [facility] Pulmonary Rehab.

## 2019-05-04 ENCOUNTER — Encounter (HOSPITAL_COMMUNITY)
Admission: RE | Admit: 2019-05-04 | Discharge: 2019-05-04 | Disposition: A | Discharge status: Home / Self Care | Payer: Medicare HMO | Source: Ambulatory Visit | Attending: Pulmonary Disease | Admitting: Pulmonary Disease

## 2019-05-04 ENCOUNTER — Other Ambulatory Visit: Payer: Self-pay

## 2019-05-04 DIAGNOSIS — J432 Centrilobular emphysema: Secondary | ICD-10-CM

## 2019-05-04 NOTE — Progress Notes (Signed)
Daily Session Note  Patient Details  Name: Harold Bailey MRN: [id number] Date of Birth: 12-27-1965 Referring Provider:     PULMONARY REHAB OTHER RESP ORIENTATION from 04/01/2019 in [place]  Referring Provider  [name]      Encounter Date: 05/04/2019  Check In: Session Check In - 05/04/19 1330      Check-In   Supervising physician immediately available to respond to emergencies  See telemetry face sheet for immediately available MD    Location  [facility]    Staff Present  Russella Dar, MS, EP, [facility], Exercise Physiologist;Amanda Ballard, Exercise Physiologist;Roseline Ebarb Wynetta Emery, RN, BSN    Virtual Visit  No    Medication changes reported      No    Fall or balance concerns reported     Yes    Comments  walks with drop foot, L leg fusion    Tobacco Cessation  No Change   Patient says he is smoking 1.5 packs/day.   Warm-up and Cool-down  Performed as group-led Higher education careers adviser Performed  Yes    VAD Patient?  No    PAD/SET Patient?  No      Pain Assessment   Currently in Pain?  No/denies    Pain Score  0-No pain    Multiple Pain Sites  No       Capillary Blood Glucose: No results found for this or any previous visit (from the past 24 hour(s)).    Social History   Tobacco Use  Smoking Status Current Every Day Smoker  . Packs/day: 1.00  . Years: 40.00  . Pack years: 40.00  . Types: Cigarettes  Smokeless Tobacco Never Used    Goals Met:  Proper associated with RPD/PD & O2 Sat Independence with exercise equipment Improved SOB with ADL's Using PLB without cueing & demonstrates good technique Exercise tolerated well No report of cardiac concerns or symptoms Strength training completed today  Goals Unmet:  Not Applicable  Comments: Pt able to follow exercise prescription today without complaint.  Will continue to monitor for progression. Check out 1430.   Dr. Sinda Du is Medical Director for [facility] Pulmonary Rehab.

## 2019-05-05 NOTE — Progress Notes (Signed)
Pulmonary Individual Treatment Plan  Patient Details  Name: Harold Bailey MRN: [id number] Date of Birth: 13-Mar-1966 Referring Provider:     PULMONARY REHAB OTHER RESP ORIENTATION from 04/01/2019 in [place]  Referring Provider  [name]      Initial Encounter Date:    PULMONARY REHAB OTHER RESP ORIENTATION from 04/01/2019 in [place]  Date  04/01/19      Visit Diagnosis: Centrilobular emphysema ([place])   Patient's Home Medications on Admission:   Current Outpatient Medications:  .  albuterol (PROVENTIL HFA;VENTOLIN HFA) 108 (90 Base) MCG/ACT inhaler, Inhale 2 puffs into the lungs every 6 (six) hours as needed for wheezing or shortness of breath., Disp: 1 Inhaler, Rfl: 6 .  Alfalfa 500 MG TABS, Take 1,000 mg by mouth 2 (two) times a day., Disp: , Rfl:  .  ALPRAZolam (XANAX) 1 MG tablet, Take 1 mg by mouth 3 (three) times daily as needed for anxiety. , Disp: , Rfl:  .  amitriptyline (ELAVIL) 25 MG tablet, Take 1 tablet (25 mg total) by mouth at bedtime., Disp: 30 tablet, Rfl: 3 .  ascorbic acid (VITAMIN C) 1000 MG tablet, Take 1,000 mg by mouth daily., Disp: , Rfl:  .  aspirin EC 81 MG tablet, Take 81 mg by mouth daily., Disp: , Rfl:  .  BREO ELLIPTA 200-25 MCG/INH AEPB, Inhale 1 puff into the lungs daily., Disp: , Rfl:  .  divalproex (DEPAKOTE ER) 500 MG 24 hr tablet, TAKE 2 TABLETS(1000 MG) BY MOUTH DAILY (Patient taking differently: Take 500 mg by mouth 2 (two) times a day. ), Disp: 180 tablet, Rfl: 3 .  Fluticasone-Umeclidin-Vilant (TRELEGY ELLIPTA) 100-62.5-25 MCG/INH AEPB, Inhale 1 puff into the lungs daily., Disp: 1 each, Rfl: 5 .  gabapentin (NEURONTIN) 300 MG capsule, TAKE 1 CAPSULE(300 MG) BY MOUTH TWICE DAILY (Patient taking differently: Take 300 mg by mouth 2 (two) times daily. ), Disp: 60 capsule, Rfl: 5 .  ibuprofen (ADVIL) 800 MG tablet, Take 800 mg by mouth every 8 (eight) hours as needed for mild pain or moderate pain. Do not  take with meloxicam, Disp: , Rfl:  .  LECITHIN PO, Take 1 capsule by mouth daily., Disp: , Rfl:  .  meloxicam (MOBIC) 7.5 MG tablet, TAKE 1 TABLET(7.5 MG) BY MOUTH DAILY, Disp: 90 tablet, Rfl: 0 .  Multiple Vitamins-Minerals (MULTIVITAMIN WITH MINERALS) tablet, Take 1 tablet by mouth daily., Disp: , Rfl:  .  Naphazoline-Pheniramine (OPCON-A) 0.027-0.315 % SOLN, Place 1 drop into both eyes every 8 (eight) hours as needed (red, irritated eyes)., Disp: , Rfl:  .  omeprazole (PRILOSEC) 40 MG capsule, Take 40 mg by mouth daily. , Disp: , Rfl:  .  Oxcarbazepine (TRILEPTAL) 300 MG tablet, TAKE 1 TABLET(300 MG) BY MOUTH TWICE DAILY (Patient taking differently: Take 300 mg by mouth 2 (two) times daily. ), Disp: 180 tablet, Rfl: 1 .  OXYGEN, Inhale into the lungs. At home oxygen, Disp: , Rfl:  .  predniSONE (DELTASONE) 50 MG tablet, Take one tablet by mouth once daily for 5 days, Disp: 5 tablet, Rfl: 0 .  rosuvastatin (CRESTOR) 10 MG tablet, Take 10 mg by mouth daily at 6 PM., Disp: , Rfl:  .  VOLTAREN 1 % GEL, APPLY 2 TO 4 GRAMS EXTERNALLY TO THE AFFECTED AREA TWICE DAILY (Patient taking differently: Apply 2 g topically 2 (two) times daily as needed (pain). ), Disp: 500 g, Rfl: 0  Past Medical History: Past Medical History:  Diagnosis  Date  . Anxiety   . Arthritis    Left ankle, hand  . COPD (chronic obstructive pulmonary disease) ([place])   . COPD (chronic obstructive pulmonary disease) ([place])   . Drug abuse ([place])   . DVT (deep venous thrombosis) ([facility])    Bilateral legs  . Gait disturbance   . GERD (gastroesophageal reflux disease)   . 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  Marland Kitchen History of ankle surgery   . History of brain tumor   . Hyperlipidemia   . Hypertension   . 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 Memory loss   . On home oxygen therapy 08/26/2018   at night while lying down  . 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 Pneumonia   . Polycythemia   . Pre-diabetes   . Seizures ([place]) 11/19/2013   none since 2016  . Shortness of breath    Hx of smoking.  Marland Kitchen SVT (supraventricular tachycardia) ([place])   . Wears dentures   . Wears glasses     Tobacco Use: Social History   Tobacco Use  Smoking Status Current Every Day Smoker  . Packs/day: 1.00  . Years: 40.00  . Pack years: 40.00  . Types: Cigarettes  Smokeless Tobacco Never Used    Labs: Recent Review Flowsheet Data    Labs for ITP Cardiac and Pulmonary Rehab Latest Ref Rng & Units 05/20/2017 05/21/2017 05/24/2017 05/28/2017 06/10/2017   Cholestrol 0 - 200 mg/dL - - - - -   LDLCALC 0 - 99 mg/dL - - - - -   HDL >39 mg/dL - - - - -   Trlycerides <150 mg/dL - 205(H) 219(H) - -   Hemoglobin A1c 4.8 - 5.6 % - - - 6.3(H) 6.3(H)   PHART 7.350 - 7.450 7.476(H) 7.488(H) - - -   PCO2ART 32.0 - 48.0 mmHg 45.1 38.2 - - -   HCO3 20.0 - 28.0 mmol/L 33.3(H) 29.0(H) - - -   TCO2 22 - 32 mmol/L 35(H) 30 - - -   O2SAT % 91.0 95.0 - - -      Capillary Blood Glucose: Lab Results  Component Value Date   GLUCAP 172 (H) 05/27/2017   GLUCAP 103 (H) 05/27/2017   GLUCAP 92 05/27/2017   GLUCAP 105 (H) 05/27/2017   GLUCAP 127 (H) 05/26/2017     Pulmonary Assessment Scores: Pulmonary Assessment Scores    Row Name 04/01/19 1524         ADL UCSD   ADL Phase  Entry     SOB Score total  62     Rest  4     Walk  6     Stairs  2     Bath  3     Dress  3     Shop  3       CAT Score   CAT Score  24       mMRC Score   mMRC Score  3       UCSD: Self-administered rating of dyspnea associated with activities of daily living (ADLs) 6-point scale (0 = "not at all" to 5 = "maximal or unable to do because of breathlessness")  Scoring Scores range from 0 to 120.  Minimally important difference is 5 units  CAT: CAT can identify the health impairment of COPD patients and is better correlated with  disease progression.  CAT has a scoring range of  zero to 40. The CAT score is classified into four groups of low (less than 10), medium (10 - 20), high (21-30) and very high (31-40) based on the impact level of disease on health status. A CAT score over 10 suggests significant symptoms.  A worsening CAT score could be explained by an exacerbation, poor medication adherence, poor inhaler technique, or progression of COPD or comorbid conditions.  CAT MCID is 2 points  mMRC: mMRC (Modified Medical Research Council) Dyspnea Scale is used to assess the degree of baseline functional disability in patients of respiratory disease due to dyspnea. No minimal important difference is established. A decrease in score of 1 point or greater is considered a positive change.   Pulmonary Function Assessment: Pulmonary Function Assessment - 04/01/19 1548      Pulmonary Function Tests   FVC%  2.4 %    FEV1%  1.4 %    FEV1/FVC Ratio  58      Breath   Shortness of Breath  Yes       Exercise Target Goals: Exercise Program Goal: Individual exercise prescription set using results from initial 6 min walk test and THRR while considering  patient's activity barriers and safety.   Exercise Prescription Goal: Initial exercise prescription builds to 30-45 minutes a day of aerobic activity, 2-3 days per week.  Home exercise guidelines will be given to patient during program as part of exercise prescription that the participant will acknowledge.  Activity Barriers & Risk Stratification: Activity Barriers & Cardiac Risk Stratification - 04/01/19 1518      Activity Barriers & Cardiac Risk Stratification   Activity Barriers  Shortness of Breath;Joint Problems   bilateral ankle replacement   Cardiac Risk Stratification  High       6 Minute Walk: 6 Minute Walk    Row Name 04/01/19 1517         6 Minute Walk   Phase  Initial     Distance  700 feet     Walk Time  6 minutes     # of Rest Breaks  0     MPH  1.32     METS  2.01     RPE  12     Perceived  Dyspnea   14     VO2 Peak  9.75     Symptoms  Yes (comment)     Comments  5/10 bilateral ankle pain     Resting HR  100 bpm     Resting BP  140/80     Resting Oxygen Saturation   91 %     Exercise Oxygen Saturation  during 6 min walk  88 %     Max Ex. HR  107 bpm     Max Ex. BP  160/78     2 Minute Post BP  138/80        Oxygen Initial Assessment: Oxygen Initial Assessment - 04/01/19 1523      Home Oxygen   Home Oxygen Device  Home Concentrator    Sleep Oxygen Prescription  Continuous    Liters per minute  4    Home Exercise Oxygen Prescription  None    Home at Rest Exercise Oxygen Prescription  --   As needed at 4 L/min   Compliance with Home Oxygen Use  Yes      Initial 6 min Walk   Oxygen Used  None  Program Oxygen Prescription   Program Oxygen Prescription  --   Will use if O2 sat >88% at 2 to 4 L/Min     Intervention   Short Term Goals  To learn and exhibit compliance with exercise, home and travel O2 prescription;To learn and understand importance of monitoring SPO2 with pulse oximeter and demonstrate accurate use of the pulse oximeter.;To learn and understand importance of maintaining oxygen saturations>88%;To learn and demonstrate proper pursed lip breathing techniques or other breathing techniques.    Long  Term Goals  Exhibits compliance with exercise, home and travel O2 prescription;Verbalizes importance of monitoring SPO2 with pulse oximeter and return demonstration;Maintenance of O2 saturations>88%;Exhibits proper breathing techniques, such as pursed lip breathing or other method taught during program session       Oxygen Re-Evaluation: Oxygen Re-Evaluation    Row Name 05/05/19 1442             Program Oxygen Prescription   Program Oxygen Prescription  Continuous       Liters per minute  4       Comments  We use oxygen during exercise if his O2 is not maintained 88%.         Home Oxygen   Home Oxygen Device  Home Concentrator       Sleep Oxygen  Prescription  Continuous       Liters per minute  4       Home Exercise Oxygen Prescription  None       Home at Rest Exercise Oxygen Prescription  None       Compliance with Home Oxygen Use  Yes         Goals/Expected Outcomes   Short Term Goals  To learn and exhibit compliance with exercise, home and travel O2 prescription;To learn and understand importance of monitoring SPO2 with pulse oximeter and demonstrate accurate use of the pulse oximeter.;To learn and understand importance of maintaining oxygen saturations>88%;To learn and demonstrate proper pursed lip breathing techniques or other breathing techniques.       Long  Term Goals  Exhibits compliance with exercise, home and travel O2 prescription;Verbalizes importance of monitoring SPO2 with pulse oximeter and return demonstration;Maintenance of O2 saturations>88%;Exhibits proper breathing techniques, such as pursed lip breathing or other method taught during program session       Comments  Patient is able to verbailze maintaining his O2 sat >88% and to demonstrate pursed lip breathing during the sessions. We are currently not allowing patients to touch the pulsue oximeter due to [place] for reopening. Will continue to monitor for progress.       Goals/Expected Outcomes  Patient will continue to meet his short and long term goals.          Oxygen Discharge (Final Oxygen Re-Evaluation): Oxygen Re-Evaluation - 05/05/19 1442      Program Oxygen Prescription   Program Oxygen Prescription  Continuous    Liters per minute  4    Comments  We use oxygen during exercise if his O2 is not maintained 88%.      Home Oxygen   Home Oxygen Device  Home Concentrator    Sleep Oxygen Prescription  Continuous    Liters per minute  4    Home Exercise Oxygen Prescription  None    Home at Rest Exercise Oxygen Prescription  None    Compliance with Home Oxygen Use  Yes      Goals/Expected Outcomes   Short Term Goals  To learn and exhibit  compliance with exercise, home and travel O2 prescription;To learn and understand importance of monitoring SPO2 with pulse oximeter and demonstrate accurate use of the pulse oximeter.;To learn and understand importance of maintaining oxygen saturations>88%;To learn and demonstrate proper pursed lip breathing techniques or other breathing techniques.    Long  Term Goals  Exhibits compliance with exercise, home and travel O2 prescription;Verbalizes importance of monitoring SPO2 with pulse oximeter and return demonstration;Maintenance of O2 saturations>88%;Exhibits proper breathing techniques, such as pursed lip breathing or other method taught during program session    Comments  Patient is able to verbailze maintaining his O2 sat >88% and to demonstrate pursed lip breathing during the sessions. We are currently not allowing patients to touch the pulsue oximeter due to [place] for reopening. Will continue to monitor for progress.    Goals/Expected Outcomes  Patient will continue to meet his short and long term goals.       Initial Exercise Prescription: Initial Exercise Prescription - 04/01/19 1500      Date of Initial Exercise RX and Referring Provider   Date  04/01/19    Referring Provider  [name]    Expected Discharge Date  07/02/19      NuStep   Level  1    SPM  80    Minutes  17    METs  1.8      Arm Ergometer   Level  1    Watts  12    RPM  50    Minutes  17    METs  1.8      Prescription Details   Frequency (times per week)  2    Duration  Progress to 30 minutes of continuous aerobic without signs/symptoms of physical distress      Intensity   THRR 40-80% of Max Heartrate  [phone]    Ratings of Perceived Exertion  11-13    Perceived Dyspnea  0-4      Progression   Progression  Continue to progress workloads to maintain intensity without signs/symptoms of physical distress.      Resistance Training   Training Prescription  Yes    Weight  1    Reps  10-15        Perform Capillary Blood Glucose checks as needed.  Exercise Prescription Changes:  Exercise Prescription Changes    Row Name 04/01/19 1500 05/04/19 1200           Response to Exercise   Blood Pressure (Admit)  -  150/90      Blood Pressure (Exercise)  -  130/98      Blood Pressure (Exit)  -  140/82      Heart Rate (Admit)  -  120 bpm      Heart Rate (Exercise)  -  124 bpm      Heart Rate (Exit)  -  107 bpm      Oxygen Saturation (Admit)  -  90 %      Oxygen Saturation (Exercise)  -  90 %      Oxygen Saturation (Exit)  -  92 %      Rating of Perceived Exertion (Exercise)  -  11      Perceived Dyspnea (Exercise)  -  11      Symptoms  -  SOB      Comments  -  first two weeks of exercise       Duration  -  Progress to 30 minutes of  aerobic  without signs/symptoms of physical distress      Intensity  -  THRR New [phone]        Progression   Progression  -  Continue to progress workloads to maintain intensity without signs/symptoms of physical distress.      Average METs  -  2.22        Resistance Training   Training Prescription  -  Yes      Weight  -  2      Reps  -  10-15        Treadmill   MPH  -  0.6      Grade  -  0      Minutes  -  17      METs  -  1.45        NuStep   Level  -  - switched to TM         Arm Ergometer   Level  -  2.1      Watts  -  28      RPM  -  50      Minutes  -  17      METs  -  3        Home Exercise Plan   Plans to continue exercise at  Home (comment)  Home (comment)      Frequency  Add 3 additional days to program exercise sessions.  Add 3 additional days to program exercise sessions.      Initial Home Exercises Provided  04/01/19  04/01/19         Exercise Comments:  Exercise Comments    Row Name 04/13/19 1256 05/04/19 1235         Exercise Comments  Najee has attended 2 sessions so far. He is really wanting to improve his walking ability so we are doing everything we can to aid him in that, but he is unable to walk  on the treadmill. We will continue to monitor his progress and adjust his workouts accordingly.  Pt. has now attended 8 sessions. He has already started to make progress in improving his walking ability. He can now walk on the TM at 0.6 MPH allowing him to focus on straightening his leg.         Exercise Goals and Review:  Exercise Goals    Row Name 04/01/19 1519             Exercise Goals   Increase Physical Activity  Yes       Intervention  Develop an individualized exercise prescription for aerobic and resistive training based on initial evaluation findings, risk stratification, comorbidities and participant's personal goals.       Expected Outcomes  Short Term: Attend rehab on a regular basis to increase amount of physical activity.;Long Term: Add in home exercise to make exercise part of routine and to increase amount of physical activity.;Long Term: Exercising regularly at least 3-5 days a week.       Increase Strength and Stamina  Yes       Intervention  Develop an individualized exercise prescription for aerobic and resistive training based on initial evaluation findings, risk stratification, comorbidities and participant's personal goals.       Expected Outcomes  Short Term: Increase workloads from initial exercise prescription for resistance, speed, and METs.;Short Term: Perform resistance training exercises routinely during rehab and add in resistance training at home;Long Term: Improve cardiorespiratory fitness, muscular endurance and  strength as measured by increased METs and functional capacity (6MWT)       Able to understand and use rate of perceived exertion (RPE) scale  Yes       Intervention  Provide education and explanation on how to use RPE scale       Expected Outcomes  Short Term: Able to use RPE daily in rehab to express subjective intensity level;Long Term:  Able to use RPE to guide intensity level when exercising independently       Able to understand and use Dyspnea  scale  Yes       Intervention  Provide education and explanation on how to use Dyspnea scale       Expected Outcomes  Short Term: Able to use Dyspnea scale daily in rehab to express subjective sense of shortness of breath during exertion;Long Term: Able to use Dyspnea scale to guide intensity level when exercising independently       Knowledge and understanding of Target Heart Rate Range (THRR)  Yes       Intervention  Provide education and explanation of THRR including how the numbers were predicted and where they are located for reference       Expected Outcomes  Short Term: Able to state/look up THRR;Long Term: Able to use THRR to govern intensity when exercising independently;Short Term: Able to use daily as guideline for intensity in rehab       Able to check pulse independently  Yes       Intervention  Provide education and demonstration on how to check pulse in carotid and radial arteries.;Review the importance of being able to check your own pulse for safety during independent exercise       Expected Outcomes  Short Term: Able to explain why pulse checking is important during independent exercise;Long Term: Able to check pulse independently and accurately       Understanding of Exercise Prescription  Yes       Intervention  Provide education, explanation, and written materials on patient's individual exercise prescription       Expected Outcomes  Short Term: Able to explain program exercise prescription;Long Term: Able to explain home exercise prescription to exercise independently          Exercise Goals Re-Evaluation : Exercise Goals Re-Evaluation    Row Name 04/13/19 1255 05/04/19 1234           Exercise Goal Re-Evaluation   Exercise Goals Review  Increase Physical Activity;Increase Strength and Stamina;Able to understand and use rate of perceived exertion (RPE) scale;Able to understand and use Dyspnea scale;Knowledge and understanding of Target Heart Rate Range (THRR);Able to check  pulse independently;Understanding of Exercise Prescription  Increase Physical Activity;Increase Strength and Stamina;Able to understand and use rate of perceived exertion (RPE) scale;Able to understand and use Dyspnea scale;Knowledge and understanding of Target Heart Rate Range (THRR);Able to check pulse independently;Understanding of Exercise Prescription      Comments  Pt. is brand new to the program. He has completed one exercise session. He is limited in what he is able to do so we are still working to solidify his exercise prescription.  Pt. is still fairly new to the program. He is still somewhat limited in what he is able to do but we have been able to progress him from walking on the track to the TM.      Expected Outcomes  Short: increase overall activity level. Long: improve balance and walking abilities.  Short: increase overall activity  level. Long: improve balance and walking abilities.         Discharge Exercise Prescription (Final Exercise Prescription Changes): Exercise Prescription Changes - 05/04/19 1200      Response to Exercise   Blood Pressure (Admit)  150/90    Blood Pressure (Exercise)  130/98    Blood Pressure (Exit)  140/82    Heart Rate (Admit)  120 bpm    Heart Rate (Exercise)  124 bpm    Heart Rate (Exit)  107 bpm    Oxygen Saturation (Admit)  90 %    Oxygen Saturation (Exercise)  90 %    Oxygen Saturation (Exit)  92 %    Rating of Perceived Exertion (Exercise)  11    Perceived Dyspnea (Exercise)  11    Symptoms  SOB    Comments  first two weeks of exercise     Duration  Progress to 30 minutes of  aerobic without signs/symptoms of physical distress    Intensity  THRR New   [phone]     Progression   Progression  Continue to progress workloads to maintain intensity without signs/symptoms of physical distress.    Average METs  2.22      Resistance Training   Training Prescription  Yes    Weight  2    Reps  10-15      Treadmill   MPH  0.6    Grade  0     Minutes  17    METs  1.45      NuStep   Level  --   switched to TM      Arm Ergometer   Level  2.1    Watts  28    RPM  50    Minutes  17    METs  3      Home Exercise Plan   Plans to continue exercise at  Home (comment)    Frequency  Add 3 additional days to program exercise sessions.    Initial Home Exercises Provided  04/01/19       Nutrition:  Target Goals: Understanding of nutrition guidelines, daily intake of sodium 1500mg , cholesterol 200mg , calories 30% from fat and 7% or less from saturated fats, daily to have 5 or more servings of fruits and vegetables.  Biometrics: Pre Biometrics - 04/01/19 1520      Pre Biometrics   Height  6' (1.829 m)    Weight  108 kg    Waist Circumference  44 inches    Hip Circumference  48 inches    Waist to Hip Ratio  0.92 %    BMI (Calculated)  32.28    Triceps Skinfold  9 mm    % Body Fat  28.1 %    Grip Strength  7.1 kg    Flexibility  0 in    Single Leg Stand  0 seconds        Nutrition Therapy Plan and Nutrition Goals: Nutrition Therapy & Goals - 05/05/19 1455      Personal Nutrition Goals   Comments  We has not resumed the RD meeting post COVID. We are working with our new dietician to resume this meeting safely. In the interim, we are providing education through hand-outs and one-on-one education. Will continue to montior.      Intervention Plan   Intervention  Nutrition handout(s) given to patient.       Nutrition Assessments: Nutrition Assessments - 04/01/19 1521      MEDFICTS Scores  Pre Score  53       Nutrition Goals Re-Evaluation:   Nutrition Goals Discharge (Final Nutrition Goals Re-Evaluation):   Psychosocial: Target Goals: Acknowledge presence or absence of significant depression and/or stress, maximize coping skills, provide positive support system. Participant is able to verbalize types and ability to use techniques and skills needed for reducing stress and depression.  Initial Review &  Psychosocial Screening: Initial Psych Review & Screening - 04/01/19 1526      Initial Review   Current issues with  Current Depression      Family Dynamics   Good Support System?  Yes    Comments  Patient says he dranks 6 to 12 beers/day. He says he is depressed due to his chronic health conditions.      Barriers   Psychosocial barriers to participate in program  Psychosocial barriers identified (see note)      Screening Interventions   Interventions  Encouraged to exercise;To provide support and resources with identified psychosocial needs;Provide feedback about the scores to participant    Expected Outcomes  Short Term goal: Utilizing psychosocial counselor, staff and physician to assist with identification of specific Stressors or current issues interfering with healing process. Setting desired goal for each stressor or current issue identified.;Short Term goal: Identification and review with participant of any Quality of Life or Depression concerns found by scoring the questionnaire.;Long Term goal: The participant improves quality of Life and PHQ9 Scores as seen by post scores and/or verbalization of changes       Quality of Life Scores: Quality of Life - 04/01/19 1521      Quality of Life   Select  Quality of Life      Quality of Life Scores   Health/Function Pre  4.53 %    Socioeconomic Pre  8.57 %    Psych/Spiritual Pre  17.14 %    Family Pre  15.6 %    GLOBAL Pre  9.59 %      Scores of 19 and below usually indicate a poorer quality of life in these areas.  A difference of  2-3 points is a clinically meaningful difference.  A difference of 2-3 points in the total score of the Quality of Life Index has been associated with significant improvement in overall quality of life, self-image, physical symptoms, and general health in studies assessing change in quality of life.   PHQ-9: Recent Review Flowsheet Data    Depression screen [facility] 2/9 04/01/2019   Decreased Interest 1    Down, Depressed, Hopeless 1   PHQ - 2 Score 2   Altered sleeping 1   Tired, decreased energy 1   Change in appetite 1   Feeling bad or failure about yourself  1   Trouble concentrating 1   Moving slowly or fidgety/restless 2   Suicidal thoughts 0   PHQ-9 Score 9   Difficult doing work/chores Somewhat difficult     Interpretation of Total Score  Total Score Depression Severity:  1-4 = Minimal depression, 5-9 = Mild depression, 10-14 = Moderate depression, 15-19 = Moderately severe depression, 20-27 = Severe depression   Psychosocial Evaluation and Intervention: Psychosocial Evaluation - 04/01/19 1531      Psychosocial Evaluation & Interventions   Interventions  Stress management education;Relaxation education;Encouraged to exercise with the program and follow exercise prescription    Comments  Patients PHQ-9 score was 9. His QOL score was 9.43%. He says he is depressed and uses alcohol for his medication. He refused any  counseling and treatment.    Expected Outcomes  His PHQ_9 score and his QOL score will improve.    Continue Psychosocial Services   Follow up required by staff       Psychosocial Re-Evaluation: Psychosocial Re-Evaluation    [place] Name 05/05/19 1456             Psychosocial Re-Evaluation   Current issues with  Current Depression       Comments  Patient's initial QOL score was 9.59 and his PHQ-9 score was 9 with depression. Patient's depressions is managed wtih amitriptyline 25 mg daily and xanax 1 mg TID prn.       Expected Outcomes  Patient's depression will be managed with no additional psychosocial issues identified at discharge. His QOL and PHQ-9 scores will improved.       Interventions  Stress management education;Encouraged to attend Cardiac Rehabilitation for the exercise;Relaxation education       Continue Psychosocial Services   Follow up required by staff          Psychosocial Discharge (Final Psychosocial Re-Evaluation): Psychosocial Re-Evaluation  - 05/05/19 1456      Psychosocial Re-Evaluation   Current issues with  Current Depression    Comments  Patient's initial QOL score was 9.59 and his PHQ-9 score was 9 with depression. Patient's depressions is managed wtih amitriptyline 25 mg daily and xanax 1 mg TID prn.    Expected Outcomes  Patient's depression will be managed with no additional psychosocial issues identified at discharge. His QOL and PHQ-9 scores will improved.    Interventions  Stress management education;Encouraged to attend Cardiac Rehabilitation for the exercise;Relaxation education    Continue Psychosocial Services   Follow up required by staff        Education: Education Goals: Education classes will be provided on a weekly basis, covering required topics. Participant will state understanding/return demonstration of topics presented.  Learning Barriers/Preferences: Learning Barriers/Preferences - 04/01/19 1522      Learning Barriers/Preferences   Learning Barriers  None    Learning Preferences  Skilled Demonstration       Education Topics: How Lungs Work and Diseases: - Discuss the anatomy of the lungs and diseases that can affect the lungs, such as COPD.   Exercise: -Discuss the importance of exercise, FITT principles of exercise, normal and abnormal responses to exercise, and how to exercise safely.   Environmental Irritants: -Discuss types of environmental irritants and how to limit exposure to environmental irritants.   Meds/Inhalers and oxygen: - Discuss respiratory medications, definition of an inhaler and oxygen, and the proper way to use an inhaler and oxygen.   Energy Saving Techniques: - Discuss methods to conserve energy and decrease shortness of breath when performing activities of daily living.    PULMONARY REHAB OTHER RESPIRATORY from 04/22/2019 in [place]  Date  04/15/19  Educator  Etheleen Mayhew  Instruction Review Code  2- Demonstrated Understanding       Bronchial Hygiene / Breathing Techniques: - Discuss breathing mechanics, pursed-lip breathing technique,  proper posture, effective ways to clear airways, and other functional breathing techniques   PULMONARY REHAB OTHER RESPIRATORY from 04/22/2019 in [place]  Date  04/22/19  Educator  DC  Instruction Review Code  2- Demonstrated Understanding      Cleaning Equipment: - Provides group verbal and written instruction about the health risks of elevated stress, cause of high stress, and healthy ways to reduce stress.   Nutrition I: Fats: - Discuss  the types of cholesterol, what cholesterol does to the body, and how cholesterol levels can be controlled.   Nutrition II: Labels: -Discuss the different components of food labels and how to read food labels.   Respiratory Infections: - Discuss the signs and symptoms of respiratory infections, ways to prevent respiratory infections, and the importance of seeking medical treatment when having a respiratory infection.   Stress I: Signs and Symptoms: - Discuss the causes of stress, how stress may lead to anxiety and depression, and ways to limit stress.   Stress II: Relaxation: -Discuss relaxation techniques to limit stress.   Oxygen for Home/Travel: - Discuss how to prepare for travel when on oxygen and proper ways to transport and store oxygen to ensure safety.   Knowledge Questionnaire Score: Knowledge Questionnaire Score - 04/01/19 1522      Knowledge Questionnaire Score   Pre Score  12/18       Core Components/Risk Factors/Patient Goals at Admission: Personal Goals and Risk Factors at Admission - 04/01/19 1534      Core Components/Risk Factors/Patient Goals on Admission    Weight Management  Obesity    Tobacco Cessation  Yes    Number of packs per day  1.5    Expected Outcomes  Short Term: Will demonstrate readiness to quit, by selecting a quit date.    Improve shortness of breath with ADL's   Yes    Intervention  Provide education, individualized exercise plan and daily activity instruction to help decrease symptoms of SOB with activities of daily living.    Expected Outcomes  Long Term: Be able to perform more ADLs without symptoms or delay the onset of symptoms;Short Term: Improve cardiorespiratory fitness to achieve a reduction of symptoms when performing ADLs    Personal Goal Other  Yes    Personal Goal  Improved balance and gait and improved breathing.    Intervention  Patient will attend pulmonary rehab 2 days/week and supplement with exercise 3 days/week at home.    Expected Outcomes  Patient will meet his personal goals.       Core Components/Risk Factors/Patient Goals Review:  Goals and Risk Factor Review    Row Name 05/05/19 1458             Core Components/Risk Factors/Patient Goals Review   Personal Goals Review  Weight Management/Obesity;Tobacco Cessation;Improve shortness of breath with ADL's Get in shape; breathe better; cut back on smoking.       Review  Patient has completed 9 sessions gaining 2 lbs since his orientation visit. He is doing well in the program with progression. He says he has cut down his smoking from 3 packs/day to 1.5 packs/day. He is not ready to try any smoking cessation program of nicotine supplments yet.  He continues to drink alcohol routinely. He says he enjoys coming to the program and feels like it is helping his breathing. He says it gets him up and moving and out of the house. Will continue to monitor for progress.       Expected Outcomes  Patient will continue to attend sessions and complete the program meeting his personal goals.          Core Components/Risk Factors/Patient Goals at Discharge (Final Review):  Goals and Risk Factor Review - 05/05/19 1458      Core Components/Risk Factors/Patient Goals Review   Personal Goals Review  Weight Management/Obesity;Tobacco Cessation;Improve shortness of breath with ADL's   Get in  shape; breathe better; cut back on  smoking.   Review  Patient has completed 9 sessions gaining 2 lbs since his orientation visit. He is doing well in the program with progression. He says he has cut down his smoking from 3 packs/day to 1.5 packs/day. He is not ready to try any smoking cessation program of nicotine supplments yet.  He continues to drink alcohol routinely. He says he enjoys coming to the program and feels like it is helping his breathing. He says it gets him up and moving and out of the house. Will continue to monitor for progress.    Expected Outcomes  Patient will continue to attend sessions and complete the program meeting his personal goals.       ITP Comments: ITP Comments    Row Name 04/13/19 1350           ITP Comments  Patient is new to the program. He has completed 3 sessions. Will continue to monitor for progress.          Comments: ITP REVIEW Pt is making expected progress toward pulmonary rehab goals after completing 9 sessions. Recommend continued exercise, life style modification, education, and utilization of breathing techniques to increase stamina and strength and decrease shortness of breath with exertion.

## 2019-05-06 ENCOUNTER — Encounter (HOSPITAL_COMMUNITY): Payer: Medicare HMO

## 2019-05-11 ENCOUNTER — Encounter (HOSPITAL_COMMUNITY)
Admission: RE | Admit: 2019-05-11 | Discharge: 2019-05-11 | Disposition: A | Discharge status: Home / Self Care | Payer: Medicare HMO | Source: Ambulatory Visit | Attending: Pulmonary Disease | Admitting: Pulmonary Disease

## 2019-05-11 ENCOUNTER — Other Ambulatory Visit: Payer: Self-pay

## 2019-05-13 ENCOUNTER — Encounter (HOSPITAL_COMMUNITY): Payer: Medicare HMO

## 2019-05-18 ENCOUNTER — Encounter (HOSPITAL_COMMUNITY): Payer: Medicare HMO

## 2019-05-20 ENCOUNTER — Encounter (HOSPITAL_COMMUNITY)
Admission: RE | Admit: 2019-05-20 | Discharge: 2019-05-20 | Disposition: A | Discharge status: Home / Self Care | Payer: Medicare HMO | Source: Ambulatory Visit | Attending: Pulmonary Disease | Admitting: Pulmonary Disease

## 2019-05-20 ENCOUNTER — Other Ambulatory Visit: Payer: Self-pay

## 2019-05-20 DIAGNOSIS — J432 Centrilobular emphysema: Secondary | ICD-10-CM

## 2019-05-20 NOTE — Progress Notes (Signed)
Daily Session Note  Patient Details  Name: Harold Bailey MRN: [id number] Date of Birth: 11/11/1965 Referring Provider:     PULMONARY REHAB OTHER RESP ORIENTATION from 04/01/2019 in [place]  Referring Provider  [name]      Encounter Date: 05/20/2019  Check In: Session Check In - 05/20/19 1330      Check-In   Supervising physician immediately available to respond to emergencies  See telemetry face sheet for immediately available MD    Location  [facility]    Staff Present  Russella Dar, MS, EP, [facility], Exercise Physiologist;Amanda Ballard, Exercise Physiologist;Derril Franek Wynetta Emery, RN, BSN    Virtual Visit  No    Medication changes reported      No    Fall or balance concerns reported     Yes    Comments  walks with drop foot, L leg fusion    Tobacco Cessation  No Change    Warm-up and Cool-down  Performed as group-led instruction    Resistance Training Performed  Yes    VAD Patient?  No    PAD/SET Patient?  No      Pain Assessment   Currently in Pain?  No/denies    Pain Score  0-No pain    Multiple Pain Sites  No       Capillary Blood Glucose: No results found for this or any previous visit (from the past 24 hour(s)).    Social History   Tobacco Use  Smoking Status Current Every Day Smoker  . Packs/day: 1.00  . Years: 40.00  . Pack years: 40.00  . Types: Cigarettes  Smokeless Tobacco Never Used    Goals Met:  Proper associated with RPD/PD & O2 Sat Independence with exercise equipment Improved SOB with ADL's Using PLB without cueing & demonstrates good technique Exercise tolerated well No report of cardiac concerns or symptoms Strength training completed today  Goals Unmet:  Not Applicable  Comments: Pt able to follow exercise prescription today without complaint.  Will continue to monitor for progression. Check out 1430.   Dr. Sinda Du is Medical Director for [facility].

## 2019-05-22 ENCOUNTER — Ambulatory Visit
Admission: RE | Admit: 2019-05-22 | Discharge: 2019-05-22 | Disposition: A | Discharge status: Home / Self Care | Payer: Medicaid Other | Source: Ambulatory Visit | Attending: Orthopedic Surgery | Admitting: Orthopedic Surgery

## 2019-05-22 ENCOUNTER — Other Ambulatory Visit: Payer: Self-pay

## 2019-05-22 DIAGNOSIS — M5442 Lumbago with sciatica, left side: Secondary | ICD-10-CM

## 2019-05-24 ENCOUNTER — Other Ambulatory Visit: Payer: Self-pay

## 2019-05-24 ENCOUNTER — Telehealth: Payer: Self-pay | Admitting: Radiology

## 2019-05-24 DIAGNOSIS — M5442 Lumbago with sciatica, left side: Secondary | ICD-10-CM

## 2019-05-24 NOTE — Telephone Encounter (Signed)
Vickie called and states that she has to work when patient is scheduled for appt to review MRI on 05/27/2019. She asked for appointment this week on Tuesday or Friday, however, there is no availability to see Dr. Sharol Given.  An appt was offered for next week, but Vickie does not know her work schedule yet. She would like to know if Dr. Sharol Given could call patient to discuss results?  CB [phone]

## 2019-05-24 NOTE — Telephone Encounter (Signed)
I called pt and and advised of the MRI results. Per Dr. Sharol Given offered order for physical therapy and the pt would like to proceed with this. States that he would like to go to [facility] as he is already doing a cardiac rehab there. Advised order in chart and he can call and make an appt. Verbalized understanding and will call with any questions.

## 2019-05-25 ENCOUNTER — Encounter (HOSPITAL_COMMUNITY)
Admission: RE | Admit: 2019-05-25 | Discharge: 2019-05-25 | Disposition: A | Discharge status: Home / Self Care | Payer: Medicare HMO | Source: Ambulatory Visit | Attending: Pulmonary Disease | Admitting: Pulmonary Disease

## 2019-05-25 ENCOUNTER — Other Ambulatory Visit: Payer: Self-pay

## 2019-05-25 DIAGNOSIS — J432 Centrilobular emphysema: Secondary | ICD-10-CM | POA: Insufficient documentation

## 2019-05-25 NOTE — Progress Notes (Signed)
Daily Session Note  Patient Details  Name: Harold Bailey MRN: [id number] Date of Birth: [birth year] Referring Provider:     PULMONARY REHAB OTHER RESP ORIENTATION from 04/01/2019 in [place]  Referring Provider  [name]      Encounter Date: 05/25/2019  Check In: Session Check In - 05/25/19 1330      Check-In   Supervising physician immediately available to respond to emergencies  See telemetry face sheet for immediately available MD    Location  [facility]    Staff Present  Russella Dar, MS, EP, [facility], Exercise Physiologist;Sarabeth Benton, Exercise Physiologist;Debra Wynetta Emery, RN, BSN    Virtual Visit  No    Medication changes reported      No    Fall or balance concerns reported     Yes    Comments  walks with drop foot, L leg fusion    Tobacco Cessation  No Change    Warm-up and Cool-down  Performed as group-led instruction    Resistance Training Performed  Yes    VAD Patient?  No    PAD/SET Patient?  No      PAD/SET Patient   Completed foot check today?  No    Open wounds to report?  No      Pain Assessment   Currently in Pain?  No/denies    Pain Score  0-No pain    Multiple Pain Sites  No       Capillary Blood Glucose: No results found for this or any previous visit (from the past 24 hour(s)).    Social History   Tobacco Use  Smoking Status Current Every Day Smoker  . Packs/day: 1.00  . Years: 40.00  . Pack years: 40.00  . Types: Cigarettes  Smokeless Tobacco Never Used    Goals Met:  Proper associated with RPD/PD & O2 Sat Independence with exercise equipment Using PLB without cueing & demonstrates good technique Exercise tolerated well No report of cardiac concerns or symptoms Strength training completed today  Goals Unmet:  Not Applicable  Comments: Pt able to follow exercise prescription today without complaint.  Will continue to monitor for progression. Check out 1430.   Dr. Sinda Du is Medical  Director for [facility] Pulmonary Rehab.

## 2019-05-26 NOTE — Progress Notes (Signed)
Pulmonary Individual Treatment Plan  Patient Details  Name: Harold Bailey MRN: [id number] Date of Birth: [birth year] Referring Provider:     PULMONARY REHAB OTHER RESP ORIENTATION from 04/01/2019 in [place]  Referring Provider  [name]      Initial Encounter Date:    PULMONARY REHAB OTHER RESP ORIENTATION from 04/01/2019 in [facility]  Date  04/01/19      Visit Diagnosis: Centrilobular emphysema ([place])  Patient's Home Medications on Admission:   Current Outpatient Medications:  .  albuterol (PROVENTIL HFA;VENTOLIN HFA) 108 (90 Base) MCG/ACT inhaler, Inhale 2 puffs into the lungs every 6 (six) hours as needed for wheezing or shortness of breath., Disp: 1 Inhaler, Rfl: 6 .  Alfalfa 500 MG TABS, Take 1,000 mg by mouth 2 (two) times a day., Disp: , Rfl:  .  ALPRAZolam (XANAX) 1 MG tablet, Take 1 mg by mouth 3 (three) times daily as needed for anxiety. , Disp: , Rfl:  .  amitriptyline (ELAVIL) 25 MG tablet, Take 1 tablet (25 mg total) by mouth at bedtime., Disp: 30 tablet, Rfl: 3 .  ascorbic acid (VITAMIN C) 1000 MG tablet, Take 1,000 mg by mouth daily., Disp: , Rfl:  .  aspirin EC 81 MG tablet, Take 81 mg by mouth daily., Disp: , Rfl:  .  BREO ELLIPTA 200-25 MCG/INH AEPB, Inhale 1 puff into the lungs daily., Disp: , Rfl:  .  divalproex (DEPAKOTE ER) 500 MG 24 hr tablet, TAKE 2 TABLETS(1000 MG) BY MOUTH DAILY (Patient taking differently: Take 500 mg by mouth 2 (two) times a day. ), Disp: 180 tablet, Rfl: 3 .  Fluticasone-Umeclidin-Vilant (TRELEGY ELLIPTA) 100-62.5-25 MCG/INH AEPB, Inhale 1 puff into the lungs daily., Disp: 1 each, Rfl: 5 .  gabapentin (NEURONTIN) 300 MG capsule, TAKE 1 CAPSULE(300 MG) BY MOUTH TWICE DAILY (Patient taking differently: Take 300 mg by mouth 2 (two) times daily. ), Disp: 60 capsule, Rfl: 5 .  ibuprofen (ADVIL) 800 MG tablet, Take 800 mg by mouth every 8 (eight) hours as needed for mild pain or moderate pain. Do not  take with meloxicam, Disp: , Rfl:  .  LECITHIN PO, Take 1 capsule by mouth daily., Disp: , Rfl:  .  meloxicam (MOBIC) 7.5 MG tablet, TAKE 1 TABLET(7.5 MG) BY MOUTH DAILY, Disp: 90 tablet, Rfl: 0 .  Multiple Vitamins-Minerals (MULTIVITAMIN WITH MINERALS) tablet, Take 1 tablet by mouth daily., Disp: , Rfl:  .  Naphazoline-Pheniramine (OPCON-A) 0.027-0.315 % SOLN, Place 1 drop into both eyes every 8 (eight) hours as needed (red, irritated eyes)., Disp: , Rfl:  .  omeprazole (PRILOSEC) 40 MG capsule, Take 40 mg by mouth daily. , Disp: , Rfl:  .  Oxcarbazepine (TRILEPTAL) 300 MG tablet, TAKE 1 TABLET(300 MG) BY MOUTH TWICE DAILY (Patient taking differently: Take 300 mg by mouth 2 (two) times daily. ), Disp: 180 tablet, Rfl: 1 .  OXYGEN, Inhale into the lungs. At home oxygen, Disp: , Rfl:  .  predniSONE (DELTASONE) 50 MG tablet, Take one tablet by mouth once daily for 5 days, Disp: 5 tablet, Rfl: 0 .  rosuvastatin (CRESTOR) 10 MG tablet, Take 10 mg by mouth daily at 6 PM., Disp: , Rfl:  .  VOLTAREN 1 % GEL, APPLY 2 TO 4 GRAMS EXTERNALLY TO THE AFFECTED AREA TWICE DAILY (Patient taking differently: Apply 2 g topically 2 (two) times daily as needed (pain). ), Disp: 500 g, Rfl: 0  Past Medical History: Past Medical History:  Diagnosis Date  .  Anxiety   . Arthritis    Left ankle, hand  . COPD (chronic obstructive pulmonary disease) ([place])   . COPD (chronic obstructive pulmonary disease) ([place])   . Drug abuse ([place])   . DVT (deep venous thrombosis) ([facility])    Bilateral legs  . Gait disturbance   . GERD (gastroesophageal reflux disease)   . 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  Marland Kitchen History of ankle surgery   . History of brain tumor   . Hyperlipidemia   . Hypertension   . 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 Memory loss   . On home oxygen therapy 08/26/2018   at night while lying down  . 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 Pneumonia   . Polycythemia   . Pre-diabetes   . Seizures ([place]) 11/19/2013   none since 2016  . Shortness of breath    Hx of smoking.  Marland Kitchen SVT (supraventricular tachycardia) ([place])   . Wears dentures   . Wears glasses     Tobacco Use: Social History   Tobacco Use  Smoking Status Current Every Day Smoker  . Packs/day: 1.00  . Years: 40.00  . Pack years: 40.00  . Types: Cigarettes  Smokeless Tobacco Never Used    Labs: Recent Review Flowsheet Data    Labs for ITP Cardiac and Pulmonary Rehab Latest Ref Rng & Units 05/20/2017 05/21/2017 05/24/2017 05/28/2017 06/10/2017   Cholestrol 0 - 200 mg/dL - - - - -   LDLCALC 0 - 99 mg/dL - - - - -   HDL >39 mg/dL - - - - -   Trlycerides <150 mg/dL - 205(H) 219(H) - -   Hemoglobin A1c 4.8 - 5.6 % - - - 6.3(H) 6.3(H)   PHART 7.350 - 7.450 7.476(H) 7.488(H) - - -   PCO2ART 32.0 - 48.0 mmHg 45.1 38.2 - - -   HCO3 20.0 - 28.0 mmol/L 33.3(H) 29.0(H) - - -   TCO2 22 - 32 mmol/L 35(H) 30 - - -   O2SAT % 91.0 95.0 - - -      Capillary Blood Glucose: Lab Results  Component Value Date   GLUCAP 172 (H) 05/27/2017   GLUCAP 103 (H) 05/27/2017   GLUCAP 92 05/27/2017   GLUCAP 105 (H) 05/27/2017   GLUCAP 127 (H) 05/26/2017     Pulmonary Assessment Scores: Pulmonary Assessment Scores    Row Name 04/01/19 1524         ADL UCSD   ADL Phase  Entry     SOB Score total  62     Rest  4     Walk  6     Stairs  2     Bath  3     Dress  3     Shop  3       CAT Score   CAT Score  24       mMRC Score   mMRC Score  3       UCSD: Self-administered rating of dyspnea associated with activities of daily living (ADLs) 6-point scale (0 = "not at all" to 5 = "maximal or unable to do because of breathlessness")  Scoring Scores range from 0 to 120.  Minimally important difference is 5 units  CAT: CAT can identify the health impairment of COPD patients and is better correlated with disease progression.  CAT has a scoring range of  zero to 40. The CAT score is classified into four groups of low (less than 10), medium (10 - 20), high (21-30) and very high (31-40) based on the impact level of disease on health status. A CAT score over 10 suggests significant symptoms.  A worsening CAT score could be explained by an exacerbation, poor medication adherence, poor inhaler technique, or progression of COPD or comorbid conditions.  CAT MCID is 2 points  mMRC: mMRC (Modified Medical Research Council) Dyspnea Scale is used to assess the degree of baseline functional disability in patients of respiratory disease due to dyspnea. No minimal important difference is established. A decrease in score of 1 point or greater is considered a positive change.   Pulmonary Function Assessment: Pulmonary Function Assessment - 04/01/19 1548      Pulmonary Function Tests   FVC%  2.4 %    FEV1%  1.4 %    FEV1/FVC Ratio  58      Breath   Shortness of Breath  Yes       Exercise Target Goals: Exercise Program Goal: Individual exercise prescription set using results from initial 6 min walk test and THRR while considering  patient's activity barriers and safety.   Exercise Prescription Goal: Initial exercise prescription builds to 30-45 minutes a day of aerobic activity, 2-3 days per week.  Home exercise guidelines will be given to patient during program as part of exercise prescription that the participant will acknowledge.  Activity Barriers & Risk Stratification: Activity Barriers & Cardiac Risk Stratification - 04/01/19 1518      Activity Barriers & Cardiac Risk Stratification   Activity Barriers  Shortness of Breath;Joint Problems   bilateral ankle replacement   Cardiac Risk Stratification  High       6 Minute Walk: 6 Minute Walk    Row Name 04/01/19 1517         6 Minute Walk   Phase  Initial     Distance  700 feet     Walk Time  6 minutes     # of Rest Breaks  0     MPH  1.32     METS  2.01     RPE  12     Perceived  Dyspnea   14     VO2 Peak  9.75     Symptoms  Yes (comment)     Comments  5/10 bilateral ankle pain     Resting HR  100 bpm     Resting BP  140/80     Resting Oxygen Saturation   91 %     Exercise Oxygen Saturation  during 6 min walk  88 %     Max Ex. HR  107 bpm     Max Ex. BP  160/78     2 Minute Post BP  138/80        Oxygen Initial Assessment: Oxygen Initial Assessment - 04/01/19 1523      Home Oxygen   Home Oxygen Device  Home Concentrator    Sleep Oxygen Prescription  Continuous    Liters per minute  4    Home Exercise Oxygen Prescription  None    Home at Rest Exercise Oxygen Prescription  --   As needed at 4 L/min   Compliance with Home Oxygen Use  Yes      Initial 6 min Walk   Oxygen Used  None      Program Oxygen Prescription  Program Oxygen Prescription  --   Will use if O2 sat >88% at 2 to 4 L/Min     Intervention   Short Term Goals  To learn and exhibit compliance with exercise, home and travel O2 prescription;To learn and understand importance of monitoring SPO2 with pulse oximeter and demonstrate accurate use of the pulse oximeter.;To learn and understand importance of maintaining oxygen saturations>88%;To learn and demonstrate proper pursed lip breathing techniques or other breathing techniques.    Long  Term Goals  Exhibits compliance with exercise, home and travel O2 prescription;Verbalizes importance of monitoring SPO2 with pulse oximeter and return demonstration;Maintenance of O2 saturations>88%;Exhibits proper breathing techniques, such as pursed lip breathing or other method taught during program session       Oxygen Re-Evaluation: Oxygen Re-Evaluation    Row Name 05/05/19 1442 05/26/19 1331           Program Oxygen Prescription   Program Oxygen Prescription  Continuous  Continuous      Liters per minute  4  4      FiO2%  -  91      Comments  We use oxygen during exercise if his O2 is not maintained 88%.  We use oxygen during exercise if his  O2 is not maintained 88%.        Home Oxygen   Home Oxygen Device  Home Concentrator  Home Concentrator      Sleep Oxygen Prescription  Continuous  Continuous      Liters per minute  4  4      Home Exercise Oxygen Prescription  None  None      Home at Rest Exercise Oxygen Prescription  None  None      Compliance with Home Oxygen Use  Yes  Yes        Goals/Expected Outcomes   Short Term Goals  To learn and exhibit compliance with exercise, home and travel O2 prescription;To learn and understand importance of monitoring SPO2 with pulse oximeter and demonstrate accurate use of the pulse oximeter.;To learn and understand importance of maintaining oxygen saturations>88%;To learn and demonstrate proper pursed lip breathing techniques or other breathing techniques.  To learn and exhibit compliance with exercise, home and travel O2 prescription;To learn and understand importance of monitoring SPO2 with pulse oximeter and demonstrate accurate use of the pulse oximeter.;To learn and understand importance of maintaining oxygen saturations>88%;To learn and demonstrate proper pursed lip breathing techniques or other breathing techniques.      Long  Term Goals  Exhibits compliance with exercise, home and travel O2 prescription;Verbalizes importance of monitoring SPO2 with pulse oximeter and return demonstration;Maintenance of O2 saturations>88%;Exhibits proper breathing techniques, such as pursed lip breathing or other method taught during program session  Exhibits compliance with exercise, home and travel O2 prescription;Verbalizes importance of monitoring SPO2 with pulse oximeter and return demonstration;Maintenance of O2 saturations>88%;Exhibits proper breathing techniques, such as pursed lip breathing or other method taught during program session      Comments  Patient is able to verbailze maintaining his O2 sat >88% and to demonstrate pursed lip breathing during the sessions. We are currently not allowing  patients to touch the pulsue oximeter due to [place] for reopening. Will continue to monitor for progress.  Patient is able to verbailze maintaining his O2 sat >88% and to demonstrate pursed lip breathing during the sessions. We are currently not allowing patients to touch the pulsue oximeter due to [place] for reopening. Will continue to monitor for progress.  Goals/Expected Outcomes  Patient will continue to meet his short and long term goals.  Patient will continue to meet his short and long term goals.         Oxygen Discharge (Final Oxygen Re-Evaluation): Oxygen Re-Evaluation - 05/26/19 1331      Program Oxygen Prescription   Program Oxygen Prescription  Continuous    Liters per minute  4    FiO2%  91    Comments  We use oxygen during exercise if his O2 is not maintained 88%.      Home Oxygen   Home Oxygen Device  Home Concentrator    Sleep Oxygen Prescription  Continuous    Liters per minute  4    Home Exercise Oxygen Prescription  None    Home at Rest Exercise Oxygen Prescription  None    Compliance with Home Oxygen Use  Yes      Goals/Expected Outcomes   Short Term Goals  To learn and exhibit compliance with exercise, home and travel O2 prescription;To learn and understand importance of monitoring SPO2 with pulse oximeter and demonstrate accurate use of the pulse oximeter.;To learn and understand importance of maintaining oxygen saturations>88%;To learn and demonstrate proper pursed lip breathing techniques or other breathing techniques.    Long  Term Goals  Exhibits compliance with exercise, home and travel O2 prescription;Verbalizes importance of monitoring SPO2 with pulse oximeter and return demonstration;Maintenance of O2 saturations>88%;Exhibits proper breathing techniques, such as pursed lip breathing or other method taught during program session    Comments  Patient is able to verbailze maintaining his O2 sat >88% and to demonstrate pursed lip breathing  during the sessions. We are currently not allowing patients to touch the pulsue oximeter due to [place] for reopening. Will continue to monitor for progress.    Goals/Expected Outcomes  Patient will continue to meet his short and long term goals.       Initial Exercise Prescription: Initial Exercise Prescription - 04/01/19 1500      Date of Initial Exercise RX and Referring Provider   Date  04/01/19    Referring Provider  [name]    Expected Discharge Date  07/02/19      NuStep   Level  1    SPM  80    Minutes  17    METs  1.8      Arm Ergometer   Level  1    Watts  12    RPM  50    Minutes  17    METs  1.8      Prescription Details   Frequency (times per week)  2    Duration  Progress to 30 minutes of continuous aerobic without signs/symptoms of physical distress      Intensity   THRR 40-80% of Max Heartrate  [phone]    Ratings of Perceived Exertion  11-13    Perceived Dyspnea  0-4      Progression   Progression  Continue to progress workloads to maintain intensity without signs/symptoms of physical distress.      Resistance Training   Training Prescription  Yes    Weight  1    Reps  10-15       Perform Capillary Blood Glucose checks as needed.  Exercise Prescription Changes:  Exercise Prescription Changes    Row Name 04/01/19 1500 05/04/19 1200 05/25/19 1400         Response to Exercise   Blood Pressure (Admit)  -  150/90  138/80     Blood Pressure (Exercise)  -  130/98  144/88     Blood Pressure (Exit)  -  140/82  140/86     Heart Rate (Admit)  -  120 bpm  97 bpm     Heart Rate (Exercise)  -  124 bpm  111 bpm     Heart Rate (Exit)  -  107 bpm  97 bpm     Oxygen Saturation (Admit)  -  90 %  91 %     Oxygen Saturation (Exercise)  -  90 %  91 %     Oxygen Saturation (Exit)  -  92 %  91 %     Rating of Perceived Exertion (Exercise)  -  11  11     Perceived Dyspnea (Exercise)  -  11  11     Symptoms  -  SOB  -     Comments  -  first two weeks of  exercise   -     Duration  -  Progress to 30 minutes of  aerobic without signs/symptoms of physical distress  Continue with 30 min of aerobic exercise without signs/symptoms of physical distress.     Intensity  -  THRR New [phone]  THRR unchanged       Progression   Progression  -  Continue to progress workloads to maintain intensity without signs/symptoms of physical distress.  Continue to progress workloads to maintain intensity without signs/symptoms of physical distress.     Average METs  -  2.22  2.3       Resistance Training   Training Prescription  -  Yes  Yes     Weight  -  2  3     Reps  -  10-15  10-15       Treadmill   MPH  -  0.6  0.6 unable to walk any faster due to ankle fusion     Grade  -  0  0     Minutes  -  17  17     METs  -  1.45  1.45       NuStep   Level  -  - switched to TM   -       Arm Ergometer   Level  -  2.1  2.1     Watts  -  28  28     RPM  -  50  50     Minutes  -  17  17     METs  -  3  3       Home Exercise Plan   Plans to continue exercise at  Home (comment)  Home (comment)  Home (comment)     Frequency  Add 3 additional days to program exercise sessions.  Add 3 additional days to program exercise sessions.  Add 3 additional days to program exercise sessions.     Initial Home Exercises Provided  04/01/19  04/01/19  04/01/19        Exercise Comments:  Exercise Comments    Row Name 04/13/19 1256 05/04/19 1235 05/25/19 1439       Exercise Comments  Alvar has attended 2 sessions so far. He is really wanting to improve his walking ability so we are doing everything we can to aid him in that, but he is unable to walk on the treadmill. We will continue to monitor his progress and adjust his workouts  accordingly.  Pt. has now attended 8 sessions. He has already started to make progress in improving his walking ability. He can now walk on the TM at 0.6 MPH allowing him to focus on straightening his leg.  Pt. is still making progress on the TM. He  can only walk at 0.6 MPH but it is helping him to increase his stength in his legs.        Exercise Goals and Review:  Exercise Goals    Row Name 04/01/19 1519             Exercise Goals   Increase Physical Activity  Yes       Intervention  Develop an individualized exercise prescription for aerobic and resistive training based on initial evaluation findings, risk stratification, comorbidities and participant's personal goals.       Expected Outcomes  Short Term: Attend rehab on a regular basis to increase amount of physical activity.;Long Term: Add in home exercise to make exercise part of routine and to increase amount of physical activity.;Long Term: Exercising regularly at least 3-5 days a week.       Increase Strength and Stamina  Yes       Intervention  Develop an individualized exercise prescription for aerobic and resistive training based on initial evaluation findings, risk stratification, comorbidities and participant's personal goals.       Expected Outcomes  Short Term: Increase workloads from initial exercise prescription for resistance, speed, and METs.;Short Term: Perform resistance training exercises routinely during rehab and add in resistance training at home;Long Term: Improve cardiorespiratory fitness, muscular endurance and strength as measured by increased METs and functional capacity (6MWT)       Able to understand and use rate of perceived exertion (RPE) scale  Yes       Intervention  Provide education and explanation on how to use RPE scale       Expected Outcomes  Short Term: Able to use RPE daily in rehab to express subjective intensity level;Long Term:  Able to use RPE to guide intensity level when exercising independently       Able to understand and use Dyspnea scale  Yes       Intervention  Provide education and explanation on how to use Dyspnea scale       Expected Outcomes  Short Term: Able to use Dyspnea scale daily in rehab to express subjective sense of  shortness of breath during exertion;Long Term: Able to use Dyspnea scale to guide intensity level when exercising independently       Knowledge and understanding of Target Heart Rate Range (THRR)  Yes       Intervention  Provide education and explanation of THRR including how the numbers were predicted and where they are located for reference       Expected Outcomes  Short Term: Able to state/look up THRR;Long Term: Able to use THRR to govern intensity when exercising independently;Short Term: Able to use daily as guideline for intensity in rehab       Able to check pulse independently  Yes       Intervention  Provide education and demonstration on how to check pulse in carotid and radial arteries.;Review the importance of being able to check your own pulse for safety during independent exercise       Expected Outcomes  Short Term: Able to explain why pulse checking is important during independent exercise;Long Term: Able to check pulse independently and accurately  Understanding of Exercise Prescription  Yes       Intervention  Provide education, explanation, and written materials on patient's individual exercise prescription       Expected Outcomes  Short Term: Able to explain program exercise prescription;Long Term: Able to explain home exercise prescription to exercise independently          Exercise Goals Re-Evaluation : Exercise Goals Re-Evaluation    Row Name 04/13/19 1255 05/04/19 1234 05/25/19 1439         Exercise Goal Re-Evaluation   Exercise Goals Review  Increase Physical Activity;Increase Strength and Stamina;Able to understand and use rate of perceived exertion (RPE) scale;Able to understand and use Dyspnea scale;Knowledge and understanding of Target Heart Rate Range (THRR);Able to check pulse independently;Understanding of Exercise Prescription  Increase Physical Activity;Increase Strength and Stamina;Able to understand and use rate of perceived exertion (RPE) scale;Able to  understand and use Dyspnea scale;Knowledge and understanding of Target Heart Rate Range (THRR);Able to check pulse independently;Understanding of Exercise Prescription  Increase Physical Activity;Increase Strength and Stamina;Able to understand and use rate of perceived exertion (RPE) scale;Able to understand and use Dyspnea scale;Knowledge and understanding of Target Heart Rate Range (THRR);Able to check pulse independently;Understanding of Exercise Prescription     Comments  Pt. is brand new to the program. He has completed one exercise session. He is limited in what he is able to do so we are still working to solidify his exercise prescription.  Pt. is still fairly new to the program. He is still somewhat limited in what he is able to do but we have been able to progress him from walking on the track to the TM.  Pt. is a regular in [facility]. He comes and tries his best in order to build up the strength in his legs.     Expected Outcomes  Short: increase overall activity level. Long: improve balance and walking abilities.  Short: increase overall activity level. Long: improve balance and walking abilities.  Short: increase overall activity level. Long: improve balance and walking abilities.        Discharge Exercise Prescription (Final Exercise Prescription Changes): Exercise Prescription Changes - 05/25/19 1400      Response to Exercise   Blood Pressure (Admit)  138/80    Blood Pressure (Exercise)  144/88    Blood Pressure (Exit)  140/86    Heart Rate (Admit)  97 bpm    Heart Rate (Exercise)  111 bpm    Heart Rate (Exit)  97 bpm    Oxygen Saturation (Admit)  91 %    Oxygen Saturation (Exercise)  91 %    Oxygen Saturation (Exit)  91 %    Rating of Perceived Exertion (Exercise)  11    Perceived Dyspnea (Exercise)  11    Duration  Continue with 30 min of aerobic exercise without signs/symptoms of physical distress.    Intensity  THRR unchanged      Progression   Progression  Continue to progress  workloads to maintain intensity without signs/symptoms of physical distress.    Average METs  2.3      Resistance Training   Training Prescription  Yes    Weight  3    Reps  10-15      Treadmill   MPH  0.6   unable to walk any faster due to ankle fusion   Grade  0    Minutes  17    METs  1.45      Arm Ergometer  Level  2.1    Watts  28    RPM  50    Minutes  17    METs  3      Home Exercise Plan   Plans to continue exercise at  Home (comment)    Frequency  Add 3 additional days to program exercise sessions.    Initial Home Exercises Provided  04/01/19       Nutrition:  Target Goals: Understanding of nutrition guidelines, daily intake of sodium 1500mg , cholesterol 200mg , calories 30% from fat and 7% or less from saturated fats, daily to have 5 or more servings of fruits and vegetables.  Biometrics: Pre Biometrics - 04/01/19 1520      Pre Biometrics   Height  6' (1.829 m)    Weight  108 kg    Waist Circumference  44 inches    Hip Circumference  48 inches    Waist to Hip Ratio  0.92 %    BMI (Calculated)  32.28    Triceps Skinfold  9 mm    % Body Fat  28.1 %    Grip Strength  7.1 kg    Flexibility  0 in    Single Leg Stand  0 seconds        Nutrition Therapy Plan and Nutrition Goals: Nutrition Therapy & Goals - 05/26/19 1332      Personal Nutrition Goals   Comments  We has not resumed the RD meeting post COVID. We are working with our new dietician to resume this meeting safely. In the interim, we are providing education through hand-outs and one-on-one education. Will continue to montior.      Intervention Plan   Intervention  Nutrition handout(s) given to patient.       Nutrition Assessments: Nutrition Assessments - 04/01/19 1521      MEDFICTS Scores   Pre Score  53       Nutrition Goals Re-Evaluation:   Nutrition Goals Discharge (Final Nutrition Goals Re-Evaluation):   Psychosocial: Target Goals: Acknowledge presence or absence of  significant depression and/or stress, maximize coping skills, provide positive support system. Participant is able to verbalize types and ability to use techniques and skills needed for reducing stress and depression.  Initial Review & Psychosocial Screening: Initial Psych Review & Screening - 04/01/19 1526      Initial Review   Current issues with  Current Depression      Family Dynamics   Good Support System?  Yes    Comments  Patient says he dranks 6 to 12 beers/day. He says he is depressed due to his chronic health conditions.      Barriers   Psychosocial barriers to participate in program  Psychosocial barriers identified (see note)      Screening Interventions   Interventions  Encouraged to exercise;To provide support and resources with identified psychosocial needs;Provide feedback about the scores to participant    Expected Outcomes  Short Term goal: Utilizing psychosocial counselor, staff and physician to assist with identification of specific Stressors or current issues interfering with healing process. Setting desired goal for each stressor or current issue identified.;Short Term goal: Identification and review with participant of any Quality of Life or Depression concerns found by scoring the questionnaire.;Long Term goal: The participant improves quality of Life and PHQ9 Scores as seen by post scores and/or verbalization of changes       Quality of Life Scores: Quality of Life - 04/01/19 1521      Quality of Life  Select  Quality of Life      Quality of Life Scores   Health/Function Pre  4.53 %    Socioeconomic Pre  8.57 %    Psych/Spiritual Pre  17.14 %    Family Pre  15.6 %    GLOBAL Pre  9.59 %      Scores of 19 and below usually indicate a poorer quality of life in these areas.  A difference of  2-3 points is a clinically meaningful difference.  A difference of 2-3 points in the total score of the Quality of Life Index has been associated with significant  improvement in overall quality of life, self-image, physical symptoms, and general health in studies assessing change in quality of life.   PHQ-9: Recent Review Flowsheet Data    Depression screen [facility] 2/9 04/01/2019   Decreased Interest 1   Down, Depressed, Hopeless 1   PHQ - 2 Score 2   Altered sleeping 1   Tired, decreased energy 1   Change in appetite 1   Feeling bad or failure about yourself  1   Trouble concentrating 1   Moving slowly or fidgety/restless 2   Suicidal thoughts 0   PHQ-9 Score 9   Difficult doing work/chores Somewhat difficult     Interpretation of Total Score  Total Score Depression Severity:  1-4 = Minimal depression, 5-9 = Mild depression, 10-14 = Moderate depression, 15-19 = Moderately severe depression, 20-27 = Severe depression   Psychosocial Evaluation and Intervention: Psychosocial Evaluation - 04/01/19 1531      Psychosocial Evaluation & Interventions   Interventions  Stress management education;Relaxation education;Encouraged to exercise with the program and follow exercise prescription    Comments  Patients PHQ-9 score was 9. His QOL score was 9.43%. He says he is depressed and uses alcohol for his medication. He refused any counseling and treatment.    Expected Outcomes  His PHQ_9 score and his QOL score will improve.    Continue Psychosocial Services   Follow up required by staff       Psychosocial Re-Evaluation: Psychosocial Re-Evaluation    [place] Name 05/05/19 1456 05/26/19 1345           Psychosocial Re-Evaluation   Current issues with  Current Depression  Current Depression      Comments  Patient's initial QOL score was 9.59 and his PHQ-9 score was 9 with depression. Patient's depressions is managed wtih amitriptyline 25 mg daily and xanax 1 mg TID prn.  Patient's initial QOL score was 9.59 and his PHQ-9 score was 9 with depression. Patient's depressions is managed wtih amitriptyline 25 mg daily and xanax 1 mg TID prn.      Expected  Outcomes  Patient's depression will be managed with no additional psychosocial issues identified at discharge. His QOL and PHQ-9 scores will improved.  Patient's depression will be managed with no additional psychosocial issues identified at discharge. His QOL and PHQ-9 scores will improved.      Interventions  Stress management education;Encouraged to attend Cardiac Rehabilitation for the exercise;Relaxation education  Stress management education;Encouraged to attend Cardiac Rehabilitation for the exercise;Relaxation education      Continue Psychosocial Services   Follow up required by staff  Follow up required by staff         Psychosocial Discharge (Final Psychosocial Re-Evaluation): Psychosocial Re-Evaluation - 05/26/19 1345      Psychosocial Re-Evaluation   Current issues with  Current Depression    Comments  Patient's initial QOL score was  9.59 and his PHQ-9 score was 9 with depression. Patient's depressions is managed wtih amitriptyline 25 mg daily and xanax 1 mg TID prn.    Expected Outcomes  Patient's depression will be managed with no additional psychosocial issues identified at discharge. His QOL and PHQ-9 scores will improved.    Interventions  Stress management education;Encouraged to attend Cardiac Rehabilitation for the exercise;Relaxation education    Continue Psychosocial Services   Follow up required by staff        Education: Education Goals: Education classes will be provided on a weekly basis, covering required topics. Participant will state understanding/return demonstration of topics presented.  Learning Barriers/Preferences: Learning Barriers/Preferences - 04/01/19 1522      Learning Barriers/Preferences   Learning Barriers  None    Learning Preferences  Skilled Demonstration       Education Topics: How Lungs Work and Diseases: - Discuss the anatomy of the lungs and diseases that can affect the lungs, such as COPD.   Exercise: -Discuss the importance of  exercise, FITT principles of exercise, normal and abnormal responses to exercise, and how to exercise safely.   Environmental Irritants: -Discuss types of environmental irritants and how to limit exposure to environmental irritants.   Meds/Inhalers and oxygen: - Discuss respiratory medications, definition of an inhaler and oxygen, and the proper way to use an inhaler and oxygen.   Energy Saving Techniques: - Discuss methods to conserve energy and decrease shortness of breath when performing activities of daily living.    PULMONARY REHAB OTHER RESPIRATORY from 05/20/2019 in [place]  Date  04/15/19  Educator  Etheleen Mayhew  Instruction Review Code  2- Demonstrated Understanding      Bronchial Hygiene / Breathing Techniques: - Discuss breathing mechanics, pursed-lip breathing technique,  proper posture, effective ways to clear airways, and other functional breathing techniques   PULMONARY REHAB OTHER RESPIRATORY from 05/20/2019 in [place]  Date  04/22/19  Educator  DC  Instruction Review Code  2- Demonstrated Understanding      Cleaning Equipment: - Provides group verbal and written instruction about the health risks of elevated stress, cause of high stress, and healthy ways to reduce stress.   Nutrition I: Fats: - Discuss the types of cholesterol, what cholesterol does to the body, and how cholesterol levels can be controlled.   Nutrition II: Labels: -Discuss the different components of food labels and how to read food labels.   PULMONARY REHAB OTHER RESPIRATORY from 05/20/2019 in [place]  Date  05/20/19  Educator  Etheleen Mayhew  Instruction Review Code  2- Demonstrated Understanding      Respiratory Infections: - Discuss the signs and symptoms of respiratory infections, ways to prevent respiratory infections, and the importance of seeking medical treatment when having a respiratory  infection.   Stress I: Signs and Symptoms: - Discuss the causes of stress, how stress may lead to anxiety and depression, and ways to limit stress.   Stress II: Relaxation: -Discuss relaxation techniques to limit stress.   Oxygen for Home/Travel: - Discuss how to prepare for travel when on oxygen and proper ways to transport and store oxygen to ensure safety.   Knowledge Questionnaire Score: Knowledge Questionnaire Score - 04/01/19 1522      Knowledge Questionnaire Score   Pre Score  12/18       Core Components/Risk Factors/Patient Goals at Admission: Personal Goals and Risk Factors at Admission - 04/01/19 1534      Core Components/Risk  Factors/Patient Goals on Admission    Weight Management  Obesity    Tobacco Cessation  Yes    Number of packs per day  1.5    Expected Outcomes  Short Term: Will demonstrate readiness to quit, by selecting a quit date.    Improve shortness of breath with ADL's  Yes    Intervention  Provide education, individualized exercise plan and daily activity instruction to help decrease symptoms of SOB with activities of daily living.    Expected Outcomes  Long Term: Be able to perform more ADLs without symptoms or delay the onset of symptoms;Short Term: Improve cardiorespiratory fitness to achieve a reduction of symptoms when performing ADLs    Personal Goal Other  Yes    Personal Goal  Improved balance and gait and improved breathing.    Intervention  Patient will attend pulmonary rehab 2 days/week and supplement with exercise 3 days/week at home.    Expected Outcomes  Patient will meet his personal goals.       Core Components/Risk Factors/Patient Goals Review:  Goals and Risk Factor Review    Row Name 05/05/19 1458 05/26/19 1332           Core Components/Risk Factors/Patient Goals Review   Personal Goals Review  Weight Management/Obesity;Tobacco Cessation;Improve shortness of breath with ADL's Get in shape; breathe better; cut back on  smoking.  Weight Management/Obesity;Tobacco Cessation;Improve shortness of breath with ADL's Get in shape; breathe better.      Review  Patient has completed 9 sessions gaining 2 lbs since his orientation visit. He is doing well in the program with progression. He says he has cut down his smoking from 3 packs/day to 1.5 packs/day. He is not ready to try any smoking cessation program of nicotine supplments yet.  He continues to drink alcohol routinely. He says he enjoys coming to the program and feels like it is helping his breathing. He says it gets him up and moving and out of the house. Will continue to monitor for progress.  Patient has completed 11 sessions maintaining his weight since last 30 day review. He is doing well in the program with progression. He says he feels stronger most days . He is having issues with his left leg with his sciatica. He saw an MD about his back and left leg pain and had an MRI. His MRI was unremarkable for changes. MD recommended PT. Patient says he plans to do the PT if it does no interfer with PR. He also reports quitting drinking alcohol. He is drinking more water. He continues to smoke 1.5 cigarettes/day. Provided smoking cessation information. Will continue to monitor for progress.      Expected Outcomes  Patient will continue to attend sessions and complete the program meeting his personal goals.  Patient will continue to attend sessions and complete the program meeting his personal goals.         Core Components/Risk Factors/Patient Goals at Discharge (Final Review):  Goals and Risk Factor Review - 05/26/19 1332      Core Components/Risk Factors/Patient Goals Review   Personal Goals Review  Weight Management/Obesity;Tobacco Cessation;Improve shortness of breath with ADL's   Get in shape; breathe better.   Review  Patient has completed 11 sessions maintaining his weight since last 30 day review. He is doing well in the program with progression. He says he feels  stronger most days . He is having issues with his left leg with his sciatica. He saw an MD about his  back and left leg pain and had an MRI. His MRI was unremarkable for changes. MD recommended PT. Patient says he plans to do the PT if it does no interfer with PR. He also reports quitting drinking alcohol. He is drinking more water. He continues to smoke 1.5 cigarettes/day. Provided smoking cessation information. Will continue to monitor for progress.    Expected Outcomes  Patient will continue to attend sessions and complete the program meeting his personal goals.       ITP Comments: ITP Comments    Row Name 04/13/19 1350           ITP Comments  Patient is new to the program. He has completed 3 sessions. Will continue to monitor for progress.          Comments: ITP REVIEW Pt is making expected progress toward pulmonary rehab goals after completing 11 sessions. Recommend continued exercise, life style modification, education, and utilization of breathing techniques to increase stamina and strength and decrease shortness of breath with exertion.

## 2019-05-27 ENCOUNTER — Encounter (HOSPITAL_COMMUNITY): Payer: Medicare HMO

## 2019-05-27 ENCOUNTER — Ambulatory Visit: Payer: Medicare HMO | Admitting: Orthopedic Surgery

## 2019-05-28 ENCOUNTER — Other Ambulatory Visit: Payer: Self-pay

## 2019-05-28 ENCOUNTER — Encounter (HOSPITAL_COMMUNITY): Payer: Self-pay | Admitting: Physical Therapy

## 2019-05-28 ENCOUNTER — Ambulatory Visit (HOSPITAL_COMMUNITY): Payer: Medicare HMO | Attending: Orthopedic Surgery | Admitting: Physical Therapy

## 2019-05-28 DIAGNOSIS — M79605 Pain in left leg: Secondary | ICD-10-CM | POA: Diagnosis present

## 2019-05-28 DIAGNOSIS — R2681 Unsteadiness on feet: Secondary | ICD-10-CM | POA: Insufficient documentation

## 2019-05-28 DIAGNOSIS — R2689 Other abnormalities of gait and mobility: Secondary | ICD-10-CM | POA: Insufficient documentation

## 2019-05-28 NOTE — Therapy (Signed)
[place], [place], [address] Phone: [phone]   Fax:  [phone]  Physical Therapy Evaluation  Patient Details  Name: Harold Bailey MRN: [id number] Date of Birth: 1966/09/11 Referring Provider (PT): Meridee Score, MD   Encounter Date: 05/28/2019  PT End of Session - 05/28/19 1513    Visit Number  1    Number of Visits  8    Date for PT Re-Evaluation  06/25/19    Authorization Type  Primary: [employer] HMO; Secondary: Medicaid    Authorization Time Period  05/28/19 - 06/25/19    PT Start Time  1122    PT Stop Time  1208    PT Time Calculation (min)  46 min    Equipment Utilized During Treatment  Gait belt    Activity Tolerance  Patient tolerated treatment well;No increased pain    Behavior During Therapy  WFL for tasks assessed/performed       Past Medical History:  Diagnosis Date  . Anxiety   . Arthritis    Left ankle, hand  . COPD (chronic obstructive pulmonary disease) ([place])   . COPD (chronic obstructive pulmonary disease) ([place])   . Drug abuse ([place])   . DVT (deep venous thrombosis) ([facility])    Bilateral legs  . Gait disturbance   . GERD (gastroesophageal reflux disease)   . 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  Marland Kitchen History of ankle surgery   . History of brain tumor   . Hyperlipidemia   . Hypertension   . 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 Memory loss   . On home oxygen therapy 08/26/2018   at night while lying down  . Peripheral vision loss    Left due to brain surgery  . Pneumonia   . Polycythemia   . Pre-diabetes   . Seizures ([place]) 11/19/2013   none since 2016  . Shortness of breath    Hx of smoking.  Marland Kitchen SVT (supraventricular tachycardia) ([place])   . Wears dentures   . Wears glasses     Past Surgical History:  Procedure Laterality Date  . ABDOMINAL AORTOGRAM W/LOWER EXTREMITY N/A 05/30/2017   Procedure:  ABDOMINAL AORTOGRAM W/LOWER EXTREMITY;  Surgeon: Elam Dutch, MD;  Location: [facility];  Service: Cardiovascular;  Laterality: N/A;  . ANKLE ARTHROSCOPY  01/17/2012   Procedure: ANKLE ARTHROSCOPY;  Surgeon: [name], MD;  Location: [facility];  Service: Orthopedics;  Laterality: Left;  . ANKLE ARTHROSCOPY WITH FUSION Left 10/02/2016   Procedure: Left Posterior Subtalar Arthrodesis Arthroscopy;  Surgeon: [name], MD;  Location: [facility];  Service: Orthopedics;  Laterality: Left;  . ANKLE FUSION Left 11/2012   Dr Sharol Given  . ANKLE SURGERY  2003   Left  . BRAIN SURGERY  2009   Biopsy  . COLONOSCOPY WITH PROPOFOL N/A 03/11/2019   Procedure: COLONOSCOPY WITH PROPOFOL;  Surgeon: Mauri Pole, MD;  Location: [facility];  Service: Endoscopy;  Laterality: N/A;  . FRACTURE SURGERY  2003   Left ankle  . HARDWARE REMOVAL Left 12/08/2012   Procedure: HARDWARE REMOVAL;  Surgeon: [name], MD;  Location: [facility];  Service: Orthopedics;  Laterality: Left;  Removal Deep Hardware, Take Down Non-Union, Revision Internal Fixation Left Ankle  . I&D EXTREMITY Left 01/01/2013   Procedure: IRRIGATION AND DEBRIDEMENT EXTREMITY;  Surgeon: [name], MD;  Location:  [facility];  Service: Orthopedics;  Laterality: Left;  Irrigation, Debridement and Placement Antibiotic Beads Left Ankle  . INNER EAR SURGERY    . OPEN REDUCTION INTERNAL FIXATION (ORIF) SCAPHOID WITH DISTAL RADIUS GRAFT Right 02/01/2013   Procedure: OPEN REDUCTION INTERNAL FIXATION (ORIF) RIGHT SCAPHOID FRACTURE WITH DISTAL RADIUS GRAFT;  Surgeon: Schuyler Amor, MD;  Location: [facility];  Service: Orthopedics;  Laterality: Right;  . ORIF ANKLE FRACTURE Left 12/08/2012   Procedure: OPEN REDUCTION INTERNAL FIXATION (ORIF) ANKLE FRACTURE;  Surgeon: [name], MD;  Location: [facility];  Service: Orthopedics;  Laterality: Left;  Removal Deep Hardware, Take Down Non-Union, Revision Internal Fixation Left Ankle  . ORIF ANKLE FRACTURE Right  09/03/2013   Procedure: OPEN REDUCTION INTERNAL FIXATION (ORIF) ANKLE FRACTURE;  Surgeon: [name], MD;  Location: [facility];  Service: Orthopedics;  Laterality: Right;  Open Reduction Internal Fixation Right Fibula  . POLYPECTOMY  03/11/2019   Procedure: POLYPECTOMY;  Surgeon: Mauri Pole, MD;  Location: [facility];  Service: Endoscopy;;  . SCAPHOID FRACTURE SURGERY  02/04/2013  . SHOULDER ARTHROSCOPY  2008   Left  . TYMPANOSTOMY TUBE PLACEMENT  1993    There were no vitals filed for this visit.   Subjective Assessment - 05/28/19 1150    Subjective  Patient reported that his primary complaint is that his left thigh is painful at times and that it is a deep ache and that his left leg will become numb at times in the anterior portion. He reported that this pain has been going on for 3 years and that it primarily starts at night. He denied any urgency in bowel and bladder function. Patient reported that it will wake him up and then it will be there for 3-4 days following this. Currently getting pulmonary therapy on Tuesdays and Thursdays for an hour. Patient reported that over the last month, the maximum his pain has been is an 8/10. Patient reported MRI was negative. Stated that he has fallen 4-5 times over the last 3.5 years. He reported that one of the falls was about 6 months ago. Patient reported no unpredictable change in weight. Reported going to a facility to get rehab 5-6 times over the last 3.5 years due to an injury with his left ankle. He initially injured it while he was jumping on a trampoline and broke his ankle. He reported having several surgeries to repair this, with ultimate left ankle fusion surgery in 2018. He reported that he had a brain tumor in 2009 which he stated is benign and he did not have it removed. He said that since due to this he does have some deficits in memory in which he cannot remember directions or numbers as well. Patient reported that he does not think  he has any issues with his back.    Pertinent History  HTN, COPD, Brain Tumor, Epilepsy seizures, history of left ankle fractures and fusion in 2018    Limitations  Walking;House hold activities    Diagnostic tests  MRI 05/22/19: Mild L5-S1 Disc Degeneration    Patient Stated Goals  To have less pain    Currently in Pain?  No/denies         [facility] PT Assessment - 05/28/19 0001      Assessment   Medical Diagnosis  Acute Left-sided LBP with left sided sciatica    Referring Provider (PT)  Meridee Score, MD    Onset Date/Surgical Date  --   3 years  Next MD Visit  Unknown    Prior Therapy  Yes, for ankle      Balance Screen   Has the patient fallen in the past 6 months  Yes    How many times?  [place] residence    Living Arrangements  Spouse/significant other    Type of [place] Access  Level entry    Home Layout  Multi-level    Alternate Level Stairs-Number of Steps  8    Alternate Level Stairs-Rails  Can reach both    Home Equipment  Wheelchair - manual;Walker - 2 wheels;Cane - single point      Prior Function   Level of Independence  Independent with basic ADLs;Independent with household mobility without device    Vocation  On disability      Cognition   Overall Cognitive Status  History of cognitive impairments - at baseline      Observation/Other Assessments   Focus on Therapeutic Outcomes (FOTO)   47% limited      Sensation   Light Touch  Appears Intact      ROM / Strength   AROM / PROM / Strength  AROM;Strength      AROM   Overall AROM Comments  Left ankle fused limited ROM    AROM Assessment Site  Lumbar   All WFL & painfree     Strength   Strength Assessment Site  Hip;Knee;Ankle    Right/Left Hip  Right;Left    Right Hip Flexion  4+/5    Right Hip Extension  4+/5    Right Hip ABduction  4+/5    Left Hip Flexion  4+/5    Left Hip Extension  4+/5    Left Hip ABduction  4+/5    Right/Left Knee   Right;Left    Right Knee Flexion  5/5    Right Knee Extension  5/5    Left Knee Flexion  5/5    Left Knee Extension  5/5    Right/Left Ankle  Right;Left    Right Ankle Dorsiflexion  5/5    Left Ankle Dorsiflexion  1/5   Ankle fusion limiting movement     Palpation   Palpation comment  Patient denied tenderness to palpation around left patella along knee joint, at greater trochanter of through superior quadriceps with minimal reports of tenderness through mid quadriceps. No palpable signs of increased termperature and no noted excessive redness or other indications of DVT.       Ambulation/Gait   Gait Comments  Observed patient ambulating into and out of the clinic with excess lateral trunk lean, decreased stance time on the left lower extremity and decreased step length on the right lower extremity.      Balance   Balance Assessed  Yes      Static Standing Balance   Static Standing - Balance Support  No upper extremity supported    Static Standing Balance -  Activities   Single Leg Stance - Right Leg;Single Leg Stance - Left Leg    Static Standing - Comment/# of Minutes  0 seconds on the left; 1 second on the right                Objective measurements completed on examination: See above findings.              PT Education - 05/28/19 1511    Education Details  Discussed examination findings, POC, discussed patient's low SpO2 reading and educated to bring portable oxygen to therapy as patient reported that he is supposed to wear it.    Person(s) Educated  Patient    Methods  Explanation    Comprehension  Verbalized understanding       PT Short Term Goals - 05/28/19 1517      PT SHORT TERM GOAL #1   Title  Patient will report understanding and regular compliance with HEP to improve strength mobility and decrease pain.    Time  2    Period  Weeks    Status  New    Target Date  06/11/19        PT Long Term Goals - 05/28/19 1517      PT LONG TERM GOAL  #1   Title  Patient will report that his pain has not exceeded a 2/10 over a 1 week period indicating improved tolerance to daily activities.    Time  4    Period  Weeks    Status  New    Target Date  06/25/19      PT LONG TERM GOAL #2   Title  Patient will demonstrate ability to maintain single limb stance for at least 3 seconds on each lower extremity indicating improved balance for safety with stair negotiation.    Time  4    Period  Weeks    Status  New    Target Date  06/25/19      PT LONG TERM GOAL #3   Title  Patient will report that his symptoms have not extended past his knee for at least 1 week indicating improved centralization of his pain.    Time  4    Period  Weeks    Status  New    Target Date  06/25/19      PT LONG TERM GOAL #4   Title  Patient will be educated on improved gait mechanics, as well as any necessary AD to decrease excessive deviations and will demonstrate understanding with minimal to no verbal cues to improve safety and decrease adverse forces with ambulation.    Time  4    Period  Weeks    Status  New    Target Date  06/25/19             Plan - 05/28/19 1603    Clinical Impression Statement  Patient is a 53 year old male who presented to outpatient physical therapy for evaluation with primary complaint of left thigh and lower leg pain which has been occurring for the last several years. Patient's medical history includes a diagnosis of HTN, COPD, brain tumor, epilepsy seizures, and left ankle fusion in 2018. Upon examination, patient demonstrated decreased hip strength bilaterally. Patient's lumbar AROM was [facility] and pain-free. Palpation to patient's left knee and anterior thigh was non-tender except minimal reports of tenderness in mid-quadriceps. Patient's left ankle is fused and therefore limited in all planes of movement. Observation of patient ambulating into and out of the clinic demonstrated several gait deviations and compensations which may  contribute to patient's pain. Patient's balance was decreased with single limb stance testing. Evaluation was limited as patient's SpO2 was assessed prior to attempting 2MWT and found to be 88%. Discussed with patient bringing portable oxygen to next session as patient said he did have some at home. Patient would benefit from skilled physical therapy to address abovementioned deficits and help patient return to prior level  of function while continuing to monitor patient for signs of a non-musculoskeletal cause of his pain.    Personal Factors and Comorbidities  Time since onset of injury/illness/exacerbation;Comorbidity 3+    Comorbidities  HTN, COPD, brain tumor    Examination-Activity Limitations  Squat;Stairs;Locomotion Level;Transfers    Examination-Participation Restrictions  Community Activity;Other   Sleeping   Stability/Clinical Decision Making  Evolving/Moderate complexity    Clinical Decision Making  Moderate    Rehab Potential  Fair    PT Frequency  2x / week    PT Duration  4 weeks    PT Treatment/Interventions  ADLs/Self Care Home Management;Aquatic Therapy;Cryotherapy;Electrical Stimulation;Moist Heat;Traction;DME Instruction;Gait training;Stair training;Functional mobility training;Therapeutic activities;Therapeutic exercise;Balance training;Neuromuscular re-education;Patient/family education;Manual techniques;Passive range of motion;Dry needling;Energy conservation;Taping    PT Next Visit Plan  Monitor SpO2 during session PRN, review evaluation/goals, initiate HEP focusing on hip strengthening initially. Assess LE DTR next session. Assess hip ROM. Continue to monitor for signs of non-musculoskeletal cause of pain    PT Home Exercise Plan  Initiate at first session    Consulted and Agree with Plan of Care  Patient       Patient will benefit from skilled therapeutic intervention in order to improve the following deficits and impairments:  Abnormal gait, Improper body mechanics, Pain,  Decreased mobility, Postural dysfunction, Decreased activity tolerance, Decreased endurance, Decreased range of motion, Decreased strength, Hypomobility, Decreased balance, Difficulty walking  Visit Diagnosis: Pain in left leg  Unsteadiness on feet  Other abnormalities of gait and mobility     Problem List Patient Active Problem List   Diagnosis Date Noted  . Positive colorectal cancer screening using Cologuard test   . Otitis media, left 10/15/2018  . On home oxygen therapy 08/26/2018  . Difficult intravenous access   . Chronic respiratory failure with hypoxia ([place])   . Essential hypertension 05/11/2017  . Plantar fasciitis of left foot 01/14/2017  . Traumatic arthritis of ankle, right 01/07/2017  . H/O ankle fusion 11/14/2016  . 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 At risk for adverse drug event 10/08/2016  . History of snoring 10/08/2016  . Subtalar joint instability, left   . 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 History of alcohol abuse 07/02/2016  . COPD exacerbation ([place]) 07/02/2016  . Tobacco dependence 06/06/2015  . Medication overuse headache 02/28/2015  . Localization-related symptomatic epilepsy and epileptic syndromes with complex partial seizures, not intractable, without status epilepticus ([place]) 02/28/2015  . Cognitive impairment 02/28/2015  . Tobacco abuse 02/28/2015  . Brain tumor ([place]) 11/19/2013  . Seizures ([place]) 11/19/2013  . Memory loss   . HA (headache)   . Gait disturbance   . Falls frequently 10/15/2013  . Swelling 09/09/2013  . Rash and nonspecific skin eruption 09/09/2013  . Ankle fracture, lateral malleolus, closed 09/03/2013  . Unspecified vitamin D deficiency 08/12/2013  . Polycythemia   . Memory deficit   . History of drug abuse ([place])   . GERD (gastroesophageal reflux disease)   . S/P tricuspid valve repair   . Arthritis   . Hyperlipidemia   . Prediabetes    Clarene Critchley PT, DPT 4:13 PM,  05/28/19 [facility] [address] [place], [place], [address] Phone: [phone]   Fax:  [phone]  Name: Harold Bailey MRN: [id number] Date of Birth: [birth year]

## 2019-06-01 ENCOUNTER — Encounter (HOSPITAL_COMMUNITY)
Admission: RE | Admit: 2019-06-01 | Discharge: 2019-06-01 | Disposition: A | Discharge status: Home / Self Care | Payer: Medicare HMO | Source: Ambulatory Visit | Attending: Pulmonary Disease | Admitting: Pulmonary Disease

## 2019-06-01 ENCOUNTER — Other Ambulatory Visit: Payer: Self-pay

## 2019-06-01 DIAGNOSIS — J432 Centrilobular emphysema: Secondary | ICD-10-CM

## 2019-06-01 NOTE — Progress Notes (Addendum)
Daily Session Note  Patient Details  Name: WILEY MAGAN MRN: [id number] Date of Birth: June 19, 1966 Referring Provider:     PULMONARY REHAB OTHER RESP ORIENTATION from 04/01/2019 in [facility]  Referring Provider  [name]      Encounter Date: 06/01/2019  Check In: Session Check In - 06/01/19 1330      Check-In   Supervising physician immediately available to respond to emergencies  See telemetry face sheet for immediately available MD    Location  [facility]    Staff Present  Benay Pike, Exercise Physiologist;Nashton Belson Wynetta Emery, RN, BSN    Virtual Visit  No    Medication changes reported      No    Fall or balance concerns reported     Yes    Comments  walks with drop foot, L leg fusion    Tobacco Cessation  No Change    Warm-up and Cool-down  Performed as group-led instruction    Resistance Training Performed  Yes    VAD Patient?  No    PAD/SET Patient?  No      Pain Assessment   Currently in Pain?  No/denies    Pain Score  0-No pain    Multiple Pain Sites  No       Capillary Blood Glucose: No results found for this or any previous visit (from the past 24 hour(s)).    Social History   Tobacco Use  Smoking Status Current Every Day Smoker  . Packs/day: 1.00  . Years: 40.00  . Pack years: 40.00  . Types: Cigarettes  Smokeless Tobacco Never Used    Goals Met:  Independence with exercise equipment Exercise tolerated well No report of cardiac concerns or symptoms Strength training completed today  Goals Unmet:  Not Applicable  Comments: Pt able to follow exercise prescription today without complaint.  Will continue to monitor for progression. Check out 1430.   Dr. Sinda Du is Medical Director for [facility].

## 2019-06-02 ENCOUNTER — Ambulatory Visit (HOSPITAL_COMMUNITY): Payer: Medicare HMO | Admitting: Physical Therapy

## 2019-06-02 ENCOUNTER — Telehealth (HOSPITAL_COMMUNITY): Payer: Self-pay | Admitting: Physical Therapy

## 2019-06-02 NOTE — Telephone Encounter (Signed)
Patient understands that his appointment was cx today due to [facility] has not approved treatment yet and he has an appointment on Friday. Hopefully we will have the approval before that appointment.

## 2019-06-03 ENCOUNTER — Other Ambulatory Visit: Payer: Self-pay

## 2019-06-03 ENCOUNTER — Encounter (HOSPITAL_COMMUNITY)
Admission: RE | Admit: 2019-06-03 | Discharge: 2019-06-03 | Disposition: A | Discharge status: Home / Self Care | Payer: Medicare HMO | Source: Ambulatory Visit | Attending: Pulmonary Disease | Admitting: Pulmonary Disease

## 2019-06-03 DIAGNOSIS — J432 Centrilobular emphysema: Secondary | ICD-10-CM

## 2019-06-03 NOTE — Progress Notes (Signed)
Daily Session Note  Patient Details  Name: Harold Bailey MRN: [id number] Date of Birth: 07/21/1966 Referring Provider:     PULMONARY REHAB OTHER RESP ORIENTATION from 04/01/2019 in [facility]  Referring Provider  [place]      Encounter Date: 06/03/2019  Check In: Session Check In - 06/03/19 1330      Check-In   Supervising physician immediately available to respond to emergencies  See telemetry face sheet for immediately available ER MD    Location  [facility]    Staff Present  Amanda Ballard, Exercise Physiologist; , RN, BSN    Virtual Visit  No    Medication changes reported      No    Fall or balance concerns reported     Yes    Comments  walks with drop foot, L leg fusion    Tobacco Cessation  No Change   Continues to smoke 1.5 packs/day cigarettes.   Warm-up and Cool-down  Performed as group-led instruction    Resistance Training Performed  Yes    VAD Patient?  No    PAD/SET Patient?  No      PAD/SET Patient   Completed foot check today?  No    Open wounds to report?  No      Pain Assessment   Currently in Pain?  No/denies    Pain Score  0-No pain    Multiple Pain Sites  No       Capillary Blood Glucose: No results found for this or any previous visit (from the past 24 hour(s)).    Social History   Tobacco Use  Smoking Status Current Every Day Smoker  . Packs/day: 1.00  . Years: 40.00  . Pack years: 40.00  . Types: Cigarettes  Smokeless Tobacco Never Used    Goals Met:  Proper associated with RPD/PD & O2 Sat Independence with exercise equipment Improved SOB with ADL's Using PLB without cueing & demonstrates good technique Exercise tolerated well No report of cardiac concerns or symptoms Strength training completed today  Goals Unmet:  Not Applicable  Comments: Check out 1430.   Dr. Edward Hawkins is Medical Director for [facility]. 

## 2019-06-04 ENCOUNTER — Ambulatory Visit (HOSPITAL_COMMUNITY): Payer: Medicare HMO | Admitting: Physical Therapy

## 2019-06-04 ENCOUNTER — Encounter (HOSPITAL_COMMUNITY): Payer: Self-pay | Admitting: Physical Therapy

## 2019-06-04 DIAGNOSIS — R2689 Other abnormalities of gait and mobility: Secondary | ICD-10-CM

## 2019-06-04 DIAGNOSIS — M79605 Pain in left leg: Secondary | ICD-10-CM

## 2019-06-04 DIAGNOSIS — R2681 Unsteadiness on feet: Secondary | ICD-10-CM

## 2019-06-04 NOTE — Therapy (Signed)
[place], [place], [address] Phone: [phone]   Fax:  [phone]  Physical Therapy Treatment  Patient Details  Name: Harold Bailey MRN: [id number] Date of Birth: 1966/07/10 Referring Provider (PT): Meridee Score, MD   Encounter Date: 06/04/2019  PT End of Session - 06/04/19 1450    Visit Number  2    Number of Visits  8    Date for PT Re-Evaluation  06/25/19    Authorization Type  Primary: [employer] HMO; Secondary: Medicaid    Authorization Time Period  05/28/19 - 06/25/19    PT Start Time  1451    PT Stop Time  1530    PT Time Calculation (min)  39 min    Equipment Utilized During Treatment  Gait belt    Activity Tolerance  Patient tolerated treatment well;No increased pain    Behavior During Therapy  WFL for tasks assessed/performed       Past Medical History:  Diagnosis Date  . Anxiety   . Arthritis    Left ankle, hand  . COPD (chronic obstructive pulmonary disease) ([place])   . COPD (chronic obstructive pulmonary disease) ([place])   . Drug abuse ([place])   . DVT (deep venous thrombosis) ([facility])    Bilateral legs  . Gait disturbance   . GERD (gastroesophageal reflux disease)   . 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  Marland Kitchen History of ankle surgery   . History of brain tumor   . Hyperlipidemia   . Hypertension   . 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 Memory loss   . On home oxygen therapy 08/26/2018   at night while lying down  . Peripheral vision loss    Left due to brain surgery  . Pneumonia   . Polycythemia   . Pre-diabetes   . Seizures ([place]) 11/19/2013   none since 2016  . Shortness of breath    Hx of smoking.  Marland Kitchen SVT (supraventricular tachycardia) ([place])   . Wears dentures   . Wears glasses     Past Surgical History:  Procedure Laterality Date  . ABDOMINAL AORTOGRAM W/LOWER EXTREMITY N/A 05/30/2017   Procedure:  ABDOMINAL AORTOGRAM W/LOWER EXTREMITY;  Surgeon: Elam Dutch, MD;  Location: [facility];  Service: Cardiovascular;  Laterality: N/A;  . ANKLE ARTHROSCOPY  01/17/2012   Procedure: ANKLE ARTHROSCOPY;  Surgeon: [name], MD;  Location: [facility];  Service: Orthopedics;  Laterality: Left;  . ANKLE ARTHROSCOPY WITH FUSION Left 10/02/2016   Procedure: Left Posterior Subtalar Arthrodesis Arthroscopy;  Surgeon: [name], MD;  Location: [facility];  Service: Orthopedics;  Laterality: Left;  . ANKLE FUSION Left 11/2012   Dr Sharol Given  . ANKLE SURGERY  2003   Left  . BRAIN SURGERY  2009   Biopsy  . COLONOSCOPY WITH PROPOFOL N/A 03/11/2019   Procedure: COLONOSCOPY WITH PROPOFOL;  Surgeon: Mauri Pole, MD;  Location: [facility];  Service: Endoscopy;  Laterality: N/A;  . FRACTURE SURGERY  2003   Left ankle  . HARDWARE REMOVAL Left 12/08/2012   Procedure: HARDWARE REMOVAL;  Surgeon: [name], MD;  Location: [facility];  Service: Orthopedics;  Laterality: Left;  Removal Deep Hardware, Take Down Non-Union, Revision Internal Fixation Left Ankle  . I&D EXTREMITY Left 01/01/2013   Procedure: IRRIGATION AND DEBRIDEMENT EXTREMITY;  Surgeon: [name], MD;  Location:  [facility];  Service: Orthopedics;  Laterality: Left;  Irrigation, Debridement and Placement Antibiotic Beads Left Ankle  . INNER EAR SURGERY    . OPEN REDUCTION INTERNAL FIXATION (ORIF) SCAPHOID WITH DISTAL RADIUS GRAFT Right 02/01/2013   Procedure: OPEN REDUCTION INTERNAL FIXATION (ORIF) RIGHT SCAPHOID FRACTURE WITH DISTAL RADIUS GRAFT;  Surgeon: Schuyler Amor, MD;  Location: [facility];  Service: Orthopedics;  Laterality: Right;  . ORIF ANKLE FRACTURE Left 12/08/2012   Procedure: OPEN REDUCTION INTERNAL FIXATION (ORIF) ANKLE FRACTURE;  Surgeon: [name], MD;  Location: [facility];  Service: Orthopedics;  Laterality: Left;  Removal Deep Hardware, Take Down Non-Union, Revision Internal Fixation Left Ankle  . ORIF ANKLE FRACTURE Right  09/03/2013   Procedure: OPEN REDUCTION INTERNAL FIXATION (ORIF) ANKLE FRACTURE;  Surgeon: [name], MD;  Location: [facility];  Service: Orthopedics;  Laterality: Right;  Open Reduction Internal Fixation Right Fibula  . POLYPECTOMY  03/11/2019   Procedure: POLYPECTOMY;  Surgeon: Mauri Pole, MD;  Location: [facility];  Service: Endoscopy;;  . SCAPHOID FRACTURE SURGERY  02/04/2013  . SHOULDER ARTHROSCOPY  2008   Left  . TYMPANOSTOMY TUBE PLACEMENT  1993    There were no vitals filed for this visit.  Subjective Assessment - 06/04/19 1452    Subjective  Patient reports that he feels alight, his leg pain is the same. Reports that he did not bring his oxygen as he didn't think he needed it. States that his main oxygen tank at home is not working well. States he called all three numbers that he had for the machine and all of them led him to sales associates.    Pertinent History  HTN, COPD, Brain Tumor, Epilepsy seizures, history of left ankle fractures and fusion in 2018    Limitations  Walking;House hold activities    Diagnostic tests  MRI 05/22/19: Mild L5-S1 Disc Degeneration    Patient Stated Goals  To have less pain    Currently in Pain?  Yes    Pain Score  6     Pain Location  Leg    Pain Orientation  Left;Anterior    Pain Descriptors / Indicators  Numbness;Aching    Pain Radiating Towards  along anterior thigh         [facility] PT Assessment - 06/04/19 0001      Assessment   Medical Diagnosis  Acute Left-sided LBP with left sided sciatica    Referring Provider (PT)  Meridee Score, MD    Onset Date/Surgical Date  --   3 years   Next MD Visit  Unknown    Prior Therapy  Yes, for ankle      Precautions   Precautions  Other (comment)    Precaution Comments  blind in left visual feild to midline      Deep Tendon Reflexes   DTR Assessment Site  Patella;Achilles    Patella DTR  2+   B   Achilles DTR  2+   on right, left ankle fused unable to assess                   [facility] Adult PT Treatment/Exercise - 06/04/19 0001      Exercises   Exercises  Knee/Hip  (Pended)       Knee/Hip Exercises: Seated   Sit to Sand  2 sets;5 reps;with UE support;Other (comment)  (Pended)    on thighs, cues to push through L LE more     Knee/Hip Exercises: Sidelying  Clams  2x8 Bilat no resistance, 3" hold  (Pended)              PT Education - 06/04/19 1540    Education Details  HEP, importance of bring O2 and cane next session. To call home health (numbers provided) in regards to his O2 machine    Person(s) Educated  Patient    Methods  Explanation    Comprehension  Verbalized understanding       PT Short Term Goals - 05/28/19 1517      PT SHORT TERM GOAL #1   Title  Patient will report understanding and regular compliance with HEP to improve strength mobility and decrease pain.    Time  2    Period  Weeks    Status  New    Target Date  06/11/19        PT Long Term Goals - 05/28/19 1517      PT LONG TERM GOAL #1   Title  Patient will report that his pain has not exceeded a 2/10 over a 1 week period indicating improved tolerance to daily activities.    Time  4    Period  Weeks    Status  New    Target Date  06/25/19      PT LONG TERM GOAL #2   Title  Patient will demonstrate ability to maintain single limb stance for at least 3 seconds on each lower extremity indicating improved balance for safety with stair negotiation.    Time  4    Period  Weeks    Status  New    Target Date  06/25/19      PT LONG TERM GOAL #3   Title  Patient will report that his symptoms have not extended past his knee for at least 1 week indicating improved centralization of his pain.    Time  4    Period  Weeks    Status  New    Target Date  06/25/19      PT LONG TERM GOAL #4   Title  Patient will be educated on improved gait mechanics, as well as any necessary AD to decrease excessive deviations and will demonstrate understanding with  minimal to no verbal cues to improve safety and decrease adverse forces with ambulation.    Time  4    Period  Weeks    Status  New    Target Date  06/25/19            Plan - 06/04/19 1551    Clinical Impression Statement  Oxygen saturation monitored during entire session. O2 ranged from 76% to 89% with it staying around 84% most of the session. Cued patient to perform pursed lip breathing throughout session when O2 saturation dropped. Patient also educated on importance of bringing oxygen and cane to session. Provided patient with contact numbers about his oxygen machine and told him to call right after session. During session it was noted that patient did not tolerate supine position (even with head elevated due to O2 saturation). It was also noted that patient has no vision on his left side (complete left visual field loss). DTR checked during session, all normal except for left ankle (which is fused). Today's session focused on education and initiating exercises with adequate rest breaks for improved oxygenation levels. Patient would continue to benefit from skilled physical therapy to improve functional outcomes.    Personal Factors and Comorbidities  Time since onset of injury/illness/exacerbation;Comorbidity  3+    Comorbidities  HTN, COPD, brain tumor    Examination-Activity Limitations  Squat;Stairs;Locomotion Level;Transfers    Examination-Participation Restrictions  Community Activity;Other   Sleeping   Stability/Clinical Decision Making  Evolving/Moderate complexity    Rehab Potential  Fair    PT Frequency  2x / week    PT Duration  4 weeks    PT Treatment/Interventions  ADLs/Self Care Home Management;Aquatic Therapy;Cryotherapy;Electrical Stimulation;Moist Heat;Traction;DME Instruction;Gait training;Stair training;Functional mobility training;Therapeutic activities;Therapeutic exercise;Balance training;Neuromuscular re-education;Patient/family education;Manual techniques;Passive  range of motion;Dry needling;Energy conservation;Taping    PT Next Visit Plan  Monitor SpO2 during entire session and note left visual field loss; strengthen LE, continue to monitor/assess pain of left LE    PT Home Exercise Plan  9/11 - sit to stands, clams    Consulted and Agree with Plan of Care  Patient       Patient will benefit from skilled therapeutic intervention in order to improve the following deficits and impairments:  Abnormal gait, Improper body mechanics, Pain, Decreased mobility, Postural dysfunction, Decreased activity tolerance, Decreased endurance, Decreased range of motion, Decreased strength, Hypomobility, Decreased balance, Difficulty walking  Visit Diagnosis: Pain in left leg  Unsteadiness on feet  Other abnormalities of gait and mobility     Problem List Patient Active Problem List   Diagnosis Date Noted  . Positive colorectal cancer screening using Cologuard test   . Otitis media, left 10/15/2018  . On home oxygen therapy 08/26/2018  . Difficult intravenous access   . Chronic respiratory failure with hypoxia ([place])   . Essential hypertension 05/11/2017  . Plantar fasciitis of left foot 01/14/2017  . Traumatic arthritis of ankle, right 01/07/2017  . H/O ankle fusion 11/14/2016  . 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 At risk for adverse drug event 10/08/2016  . History of snoring 10/08/2016  . Subtalar joint instability, left   . 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 History of alcohol abuse 07/02/2016  . COPD exacerbation ([place]) 07/02/2016  . Tobacco dependence 06/06/2015  . Medication overuse headache 02/28/2015  . Localization-related symptomatic epilepsy and epileptic syndromes with complex partial seizures, not intractable, without status epilepticus ([place]) 02/28/2015  . Cognitive impairment 02/28/2015  . Tobacco abuse 02/28/2015  . Brain tumor ([place]) 11/19/2013  . Seizures ([place]) 11/19/2013  . Memory loss   . HA (headache)    . Gait disturbance   . Falls frequently 10/15/2013  . Swelling 09/09/2013  . Rash and nonspecific skin eruption 09/09/2013  . Ankle fracture, lateral malleolus, closed 09/03/2013  . Unspecified vitamin D deficiency 08/12/2013  . Polycythemia   . Memory deficit   . History of drug abuse ([place])   . GERD (gastroesophageal reflux disease)   . S/P tricuspid valve repair   . Arthritis   . Hyperlipidemia   . Prediabetes     4:03 PM, 06/04/19 Jerene Pitch, DPT Physical Therapy with [facility]  [phone] office  [place] [address] [place], [place], [address] Phone: [phone]   Fax:  [phone]  Name: Harold Bailey MRN: [id number] Date of Birth: 12/08/1965

## 2019-06-04 NOTE — Patient Instructions (Signed)
[facility] service in [place], [place]  Address: [address], [place], [place] [address] Hours:  Friday 8AM-5PM Saturday Closed Sunday Closed Monday 8AM-5PM Tuesday 8AM-5PM Wednesday 8AM-5PM Thursday 8AM-5PM   Phone: [phone]   [facility] in [place], [place]  Address: [address], [place], [place] [address] Hours:  Friday 9AM-5PM Saturday Closed Sunday Closed Monday 9AM-5PM Tuesday 9AM-5PM Wednesday 9AM-5PM Thursday 9AM-5PM   Phone: [phone]

## 2019-06-05 ENCOUNTER — Other Ambulatory Visit: Payer: Self-pay | Admitting: Neurology

## 2019-06-07 ENCOUNTER — Telehealth (HOSPITAL_COMMUNITY): Payer: Self-pay | Admitting: Physical Therapy

## 2019-06-07 ENCOUNTER — Ambulatory Visit (HOSPITAL_COMMUNITY): Payer: Medicare HMO | Admitting: Physical Therapy

## 2019-06-07 NOTE — Telephone Encounter (Signed)
Requested Prescriptions   Pending Prescriptions Disp Refills  . Oxcarbazepine (TRILEPTAL) 300 MG tablet [Pharmacy Med Name: OXCARBAZEPINE 300MG  TABLETS] 180 tablet 1    Sig: TAKE 1 TABLET(300 MG) BY MOUTH TWICE DAILY   Rx last filled:12/07/18 #180 1 REFILLS   Pt last seen: 01/20/19   Follow up appt scheduled:07/29/19

## 2019-06-07 NOTE — Telephone Encounter (Signed)
Called patient and left voicemail about no show for today's appointment and reminded him of his next appointment. This is the patient's first no show.   4:38 PM, 06/07/19 Jerene Pitch, DPT Physical Therapy with [facility]  [phone] office

## 2019-06-07 NOTE — Telephone Encounter (Signed)
Wife called back and said she could not reach him to ask him to come in earlier

## 2019-06-08 ENCOUNTER — Encounter (HOSPITAL_COMMUNITY): Payer: Medicare HMO

## 2019-06-09 ENCOUNTER — Ambulatory Visit (HOSPITAL_COMMUNITY): Payer: Medicare HMO

## 2019-06-09 ENCOUNTER — Telehealth (HOSPITAL_COMMUNITY): Payer: Self-pay

## 2019-06-09 NOTE — Telephone Encounter (Signed)
He did his lawn work and he is too soar to come in today-he will see [place] Monday

## 2019-06-10 ENCOUNTER — Encounter (HOSPITAL_COMMUNITY): Payer: Medicare HMO

## 2019-06-14 ENCOUNTER — Ambulatory Visit (HOSPITAL_COMMUNITY): Payer: Medicare HMO | Admitting: Physical Therapy

## 2019-06-14 ENCOUNTER — Telehealth (HOSPITAL_COMMUNITY): Payer: Self-pay | Admitting: *Deleted

## 2019-06-14 NOTE — Telephone Encounter (Signed)
pt called to cx said that he fell over the weekend and can hardly move  06/14/19

## 2019-06-15 ENCOUNTER — Encounter (HOSPITAL_COMMUNITY): Payer: Medicare HMO

## 2019-06-16 ENCOUNTER — Ambulatory Visit (HOSPITAL_COMMUNITY): Payer: Medicare HMO

## 2019-06-16 ENCOUNTER — Telehealth (HOSPITAL_COMMUNITY): Payer: Self-pay

## 2019-06-16 NOTE — Telephone Encounter (Signed)
Pt had a tree fall and it hurt his hand -he cx today and wants to r/s for tomorrow if we have an opening.

## 2019-06-17 ENCOUNTER — Encounter (HOSPITAL_COMMUNITY): Payer: Medicare HMO

## 2019-06-17 ENCOUNTER — Other Ambulatory Visit: Payer: Self-pay | Admitting: Neurology

## 2019-06-17 NOTE — Telephone Encounter (Signed)
Can we verify if patient is taking medication, and if not, why?

## 2019-06-17 NOTE — Progress Notes (Signed)
Discharge Progress Report  Patient Details  Name: Harold Bailey MRN: [id number] Date of Birth: 01/14/1966 Referring Provider:     PULMONARY REHAB OTHER RESP ORIENTATION from 04/01/2019 in [place]  Referring Provider  [name]       Number of Visits: 13  Reason for Discharge:  Early Exit:  Lack of attendance  Smoking History:  Social History   Tobacco Use  Smoking Status Current Every Day Smoker  . Packs/day: 1.00  . Years: 40.00  . Pack years: 40.00  . Types: Cigarettes  Smokeless Tobacco Never Used    Diagnosis:  Centrilobular emphysema (HCC)  ADL UCSD: Pulmonary Assessment Scores    Row Name 04/01/19 1524         ADL UCSD   ADL Phase  Entry     SOB Score total  62     Rest  4     Walk  6     Stairs  2     Bath  3     Dress  3     Shop  3       CAT Score   CAT Score  24       mMRC Score   mMRC Score  3        Initial Exercise Prescription: Initial Exercise Prescription - 04/01/19 1500      Date of Initial Exercise RX and Referring Provider   Date  04/01/19    Referring Provider  [name]    Expected Discharge Date  07/02/19      NuStep   Level  1    SPM  80    Minutes  17    METs  1.8      Arm Ergometer   Level  1    Watts  12    RPM  50    Minutes  17    METs  1.8      Prescription Details   Frequency (times per week)  2    Duration  Progress to 30 minutes of continuous aerobic without signs/symptoms of physical distress      Intensity   THRR 40-80% of Max Heartrate  [phone]    Ratings of Perceived Exertion  11-13    Perceived Dyspnea  0-4      Progression   Progression  Continue to progress workloads to maintain intensity without signs/symptoms of physical distress.      Resistance Training   Training Prescription  Yes    Weight  1    Reps  10-15       Discharge Exercise Prescription (Final Exercise Prescription Changes): Exercise Prescription Changes - 05/25/19 1400      Response to Exercise    Blood Pressure (Admit)  138/80    Blood Pressure (Exercise)  144/88    Blood Pressure (Exit)  140/86    Heart Rate (Admit)  97 bpm    Heart Rate (Exercise)  111 bpm    Heart Rate (Exit)  97 bpm    Oxygen Saturation (Admit)  91 %    Oxygen Saturation (Exercise)  91 %    Oxygen Saturation (Exit)  91 %    Rating of Perceived Exertion (Exercise)  11    Perceived Dyspnea (Exercise)  11    Duration  Continue with 30 min of aerobic exercise without signs/symptoms of physical distress.    Intensity  THRR unchanged      Progression   Progression  Continue to progress  workloads to maintain intensity without signs/symptoms of physical distress.    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    Initial Home Exercises Provided  04/01/19       Functional Capacity: 6 Minute Walk    Row Name 04/01/19 1517         6 Minute Walk   Phase  Initial     Distance  700 feet     Walk Time  6 minutes     # of Rest Breaks  0     MPH  1.32     METS  2.01     RPE  12     Perceived Dyspnea   14     VO2 Peak  9.75     Symptoms  Yes (comment)     Comments  5/10 bilateral ankle pain     Resting HR  100 bpm     Resting BP  140/80     Resting Oxygen Saturation   91 %     Exercise Oxygen Saturation  during 6 min walk  88 %     Max Ex. HR  107 bpm     Max Ex. BP  160/78     2 Minute Post BP  138/80        Psychological, QOL, Others - Outcomes: PHQ 2/9: Depression screen PHQ 2/9 04/01/2019  Decreased Interest 1  Down, Depressed, Hopeless 1  PHQ - 2 Score 2  Altered sleeping 1  Tired,  decreased energy 1  Change in appetite 1  Feeling bad or failure about yourself  1  Trouble concentrating 1  Moving slowly or fidgety/restless 2  Suicidal thoughts 0  PHQ-9 Score 9  Difficult doing work/chores Somewhat difficult  Some recent data might be hidden    Quality of Life: Quality of Life - 04/01/19 1521      Quality of Life   Select  Quality of Life      Quality of Life Scores   Health/Function Pre  4.53 %    Socioeconomic Pre  8.57 %    Psych/Spiritual Pre  17.14 %    Family Pre  15.6 %    GLOBAL Pre  9.59 %       Personal Goals: Goals established at orientation with interventions provided to work toward goal. Personal Goals and Risk Factors at Admission - 04/01/19 1534      Core Components/Risk Factors/Patient Goals on Admission    Weight Management  Obesity    Tobacco Cessation  Yes    Number of packs per day  1.5    Expected Outcomes  Short Term: Will demonstrate readiness to quit, by selecting a quit date.    Improve shortness of breath with ADL's  Yes    Intervention  Provide education, individualized exercise plan and daily activity instruction to help decrease symptoms of SOB with activities of daily living.    Expected Outcomes  Long Term: Be able to perform more ADLs without symptoms or delay the onset of symptoms;Short Term: Improve cardiorespiratory fitness to achieve a reduction of symptoms when performing ADLs    Personal Goal Other  Yes    Personal Goal  Improved balance and gait and improved breathing.    Intervention  Patient will attend pulmonary rehab 2 days/week and supplement with exercise 3 days/week at home.    Expected Outcomes  Patient will meet his personal goals.        Personal Goals Discharge: Goals and Risk Factor Review    Row Name 05/05/19 1458 05/26/19 1332           Core Components/Risk Factors/Patient Goals Review   Personal Goals Review  Weight Management/Obesity;Tobacco Cessation;Improve shortness of breath with ADL's  Get in shape; breathe better; cut back on smoking.  Weight Management/Obesity;Tobacco Cessation;Improve shortness of breath with ADL's Get in shape; breathe better.      Review  Patient has completed 9 sessions gaining 2 lbs since his orientation visit. He is doing well in the program with progression. He says he has cut down his smoking from 3 packs/day to 1.5 packs/day. He is not ready to try any smoking cessation program of nicotine supplments yet.  He continues to drink alcohol routinely. He says he enjoys coming to the program and feels like it is helping his breathing. He says it gets him up and moving and out of the house. Will continue to monitor for progress.  Patient has completed 11 sessions maintaining his weight since last 30 day review. He is doing well in the program with progression. He says he feels stronger most days . He is having issues with his left leg with his sciatica. He saw an MD about his back and left leg pain and had an MRI. His MRI was unremarkable for changes. MD recommended PT. Patient says he plans to do the PT if it does no interfer with PR. He also reports quitting drinking alcohol. He is drinking more water. He continues to smoke 1.5 cigarettes/day. Provided smoking cessation information. Will continue to monitor for progress.      Expected Outcomes  Patient will continue to attend sessions and complete the program meeting his personal goals.  Patient will continue to attend sessions and complete the program meeting his personal goals.         Exercise Goals and Review: Exercise Goals    Row Name 04/01/19 1519             Exercise Goals   Increase Physical Activity  Yes       Intervention  Develop an individualized exercise prescription for aerobic and resistive training based on initial evaluation findings, risk stratification, comorbidities and participant's personal goals.       Expected Outcomes  Short Term: Attend rehab on a regular basis to increase amount  of physical activity.;Long Term: Add in home exercise to make exercise part of routine and to increase amount of physical activity.;Long Term: Exercising regularly at least 3-5 days a week.       Increase Strength and Stamina  Yes       Intervention  Develop an individualized exercise prescription for aerobic and resistive training based on initial evaluation findings, risk stratification, comorbidities and participant's personal goals.  Expected Outcomes  Short Term: Increase workloads from initial exercise prescription for resistance, speed, and METs.;Short Term: Perform resistance training exercises routinely during rehab and add in resistance training at home;Long Term: Improve cardiorespiratory fitness, muscular endurance and strength as measured by increased METs and functional capacity (6MWT)       Able to understand and use rate of perceived exertion (RPE) scale  Yes       Intervention  Provide education and explanation on how to use RPE scale       Expected Outcomes  Short Term: Able to use RPE daily in rehab to express subjective intensity level;Long Term:  Able to use RPE to guide intensity level when exercising independently       Able to understand and use Dyspnea scale  Yes       Intervention  Provide education and explanation on how to use Dyspnea scale       Expected Outcomes  Short Term: Able to use Dyspnea scale daily in rehab to express subjective sense of shortness of breath during exertion;Long Term: Able to use Dyspnea scale to guide intensity level when exercising independently       Knowledge and understanding of Target Heart Rate Range (THRR)  Yes       Intervention  Provide education and explanation of THRR including how the numbers were predicted and where they are located for reference       Expected Outcomes  Short Term: Able to state/look up THRR;Long Term: Able to use THRR to govern intensity when exercising independently;Short Term: Able to use daily as guideline for  intensity in rehab       Able to check pulse independently  Yes       Intervention  Provide education and demonstration on how to check pulse in carotid and radial arteries.;Review the importance of being able to check your own pulse for safety during independent exercise       Expected Outcomes  Short Term: Able to explain why pulse checking is important during independent exercise;Long Term: Able to check pulse independently and accurately       Understanding of Exercise Prescription  Yes       Intervention  Provide education, explanation, and written materials on patient's individual exercise prescription       Expected Outcomes  Short Term: Able to explain program exercise prescription;Long Term: Able to explain home exercise prescription to exercise independently          Exercise Goals Re-Evaluation: Exercise Goals Re-Evaluation    Row Name 04/13/19 1255 05/04/19 1234 05/25/19 1439         Exercise Goal Re-Evaluation   Exercise Goals Review  Increase Physical Activity;Increase Strength and Stamina;Able to understand and use rate of perceived exertion (RPE) scale;Able to understand and use Dyspnea scale;Knowledge and understanding of Target Heart Rate Range (THRR);Able to check pulse independently;Understanding of Exercise Prescription  Increase Physical Activity;Increase Strength and Stamina;Able to understand and use rate of perceived exertion (RPE) scale;Able to understand and use Dyspnea scale;Knowledge and understanding of Target Heart Rate Range (THRR);Able to check pulse independently;Understanding of Exercise Prescription  Increase Physical Activity;Increase Strength and Stamina;Able to understand and use rate of perceived exertion (RPE) scale;Able to understand and use Dyspnea scale;Knowledge and understanding of Target Heart Rate Range (THRR);Able to check pulse independently;Understanding of Exercise Prescription     Comments  Pt. is brand new to the program. He has completed one  exercise session. He is limited in what he is  able to do so we are still working to solidify his exercise prescription.  Pt. is still fairly new to the program. He is still somewhat limited in what he is able to do but we have been able to progress him from walking on the track to the TM.  Pt. is a regular in PR. He comes and tries his best in order to build up the strength in his legs.     Expected Outcomes  Short: increase overall activity level. Long: improve balance and walking abilities.  Short: increase overall activity level. Long: improve balance and walking abilities.  Short: increase overall activity level. Long: improve balance and walking abilities.        Nutrition & Weight - Outcomes: Pre Biometrics - 04/01/19 1520      Pre Biometrics   Height  6' (1.829 m)    Weight  108 kg    Waist Circumference  44 inches    Hip Circumference  48 inches    Waist to Hip Ratio  0.92 %    BMI (Calculated)  32.28    Triceps Skinfold  9 mm    % Body Fat  28.1 %    Grip Strength  7.1 kg    Flexibility  0 in    Single Leg Stand  0 seconds        Nutrition: Nutrition Therapy & Goals - 05/26/19 1332      Personal Nutrition Goals   Comments  We has not resumed the RD meeting post COVID. We are working with our new dietician to resume this meeting safely. In the interim, we are providing education through hand-outs and one-on-one education. Will continue to montior.      Intervention Plan   Intervention  Nutrition handout(s) given to patient.       Nutrition Discharge: Nutrition Assessments - 04/01/19 1521      MEDFICTS Scores   Pre Score  53       Education Questionnaire Score: Knowledge Questionnaire Score - 04/01/19 1522      Knowledge Questionnaire Score   Pre Score  12/18       Patient stopped coming to Pulmonary Rehab on 06/03/19. He completed 13 sessions. Called patient to remind them of the Pulmonary Rehab's policy that if they do not call or come for 3 consecutive  visits that they would be discharged from the CR program. Doctor will be informed.

## 2019-06-18 ENCOUNTER — Ambulatory Visit (HOSPITAL_COMMUNITY): Payer: Medicare HMO | Admitting: Physical Therapy

## 2019-06-18 ENCOUNTER — Telehealth: Payer: Self-pay

## 2019-06-18 ENCOUNTER — Telehealth (HOSPITAL_COMMUNITY): Payer: Self-pay | Admitting: Physical Therapy

## 2019-06-18 NOTE — Telephone Encounter (Signed)
pt called to cancel his appt due to he is not feeling well.Breathing is not to good.

## 2019-06-18 NOTE — Telephone Encounter (Signed)
As amitriptyline was discontinued by another provider, I will currently deny refill request.  If patient contacts [place] to say he is taking it, then I will refill it.

## 2019-06-18 NOTE — Telephone Encounter (Signed)
I tried to contact patient on medication refill, no answer, currently denied by [facility]

## 2019-06-21 ENCOUNTER — Telehealth (HOSPITAL_COMMUNITY): Payer: Self-pay | Admitting: *Deleted

## 2019-06-21 ENCOUNTER — Ambulatory Visit (HOSPITAL_COMMUNITY): Payer: Medicare HMO | Admitting: Physical Therapy

## 2019-06-21 NOTE — Telephone Encounter (Signed)
No answer

## 2019-06-21 NOTE — Telephone Encounter (Signed)
Patient left msg with after hours returning call. Thanks!

## 2019-06-21 NOTE — Telephone Encounter (Signed)
06/21/19  Pt called to cx said that he woulnd't be able to come in today

## 2019-06-22 ENCOUNTER — Encounter (HOSPITAL_COMMUNITY): Payer: Medicare HMO

## 2019-06-22 ENCOUNTER — Telehealth: Payer: Self-pay | Admitting: *Deleted

## 2019-06-22 DIAGNOSIS — G40209 Localization-related (focal) (partial) symptomatic epilepsy and epileptic syndromes with complex partial seizures, not intractable, without status epilepticus: Secondary | ICD-10-CM

## 2019-06-22 DIAGNOSIS — M792 Neuralgia and neuritis, unspecified: Secondary | ICD-10-CM

## 2019-06-22 DIAGNOSIS — Z79899 Other long term (current) drug therapy: Secondary | ICD-10-CM

## 2019-06-22 DIAGNOSIS — R4189 Other symptoms and signs involving cognitive functions and awareness: Secondary | ICD-10-CM

## 2019-06-22 DIAGNOSIS — G0490 Encephalitis and encephalomyelitis, unspecified: Secondary | ICD-10-CM

## 2019-06-22 DIAGNOSIS — G4440 Drug-induced headache, not elsewhere classified, not intractable: Secondary | ICD-10-CM

## 2019-06-22 NOTE — Telephone Encounter (Signed)
Patient was returning call from our office but wasn't sure why. See phone notes from 9/25 with regards to amitriptyline refill request.  Verified he uses [facility] in [place].   Dr. Tomi Likens ordered on 5/15 : amytriptylline 25mg  (1 tablet) at bedtime #30 tabs/ 3 refills.   Last appt with Dr. Tomi Likens was 4/29 and next is 11/5.   Asked patient and he is taking this medication (he did not initiate refill request - assuming pharmacy must have).   Dr. Tomi Likens - ok to refill? If so 1 refill until next appt with you?

## 2019-06-22 NOTE — Telephone Encounter (Signed)
We can give him 3 refills.

## 2019-06-23 ENCOUNTER — Telehealth: Payer: Self-pay | Admitting: Orthopedic Surgery

## 2019-06-23 ENCOUNTER — Telehealth (HOSPITAL_COMMUNITY): Payer: Self-pay | Admitting: Physical Therapy

## 2019-06-23 ENCOUNTER — Ambulatory Visit (HOSPITAL_COMMUNITY): Payer: Medicare HMO

## 2019-06-23 ENCOUNTER — Other Ambulatory Visit: Payer: Self-pay

## 2019-06-23 ENCOUNTER — Telehealth (HOSPITAL_COMMUNITY): Payer: Self-pay | Admitting: *Deleted

## 2019-06-23 DIAGNOSIS — G8929 Other chronic pain: Secondary | ICD-10-CM

## 2019-06-23 MED ORDER — AMITRIPTYLINE HCL 25 MG PO TABS: 25.0000 mg | ORAL_TABLET | Freq: Every day | ORAL | 3 refills | Status: DC

## 2019-06-23 NOTE — Telephone Encounter (Signed)
Order in chart for physical therapy eval and treat for LBP

## 2019-06-23 NOTE — Telephone Encounter (Signed)
06/23/19  pt called and said he would need to cancel today

## 2019-06-23 NOTE — Telephone Encounter (Signed)
Script for amytrlptyline sent to pharmacy.

## 2019-06-23 NOTE — Telephone Encounter (Signed)
Patient called requesting another RX to be faxed over to [facility] so he can start his PT again.  CB#[phone].  Thank you.

## 2019-06-23 NOTE — Telephone Encounter (Signed)
Patient returned phone call from Physical therapy clinic inquiring about visits. Therapist discussed with patient that he has not been seen since 06/04/19 and has had 8 last minute cancellations/no shows over the last three weeks. That his POC expires on 06/25/19 and that if he is to return he needs another order stating physical therapy is still appropriate at this time (reports he has been cancelling secondary to hand and foot pain). Patient agreed and stated he would call his MD right after this phone call.   1:21 PM, 06/23/19 Jerene Pitch, DPT Physical Therapy with [facility]  [phone] office

## 2019-06-24 ENCOUNTER — Encounter (HOSPITAL_COMMUNITY): Payer: Medicare HMO

## 2019-06-28 ENCOUNTER — Ambulatory Visit (HOSPITAL_COMMUNITY): Payer: Medicare HMO | Admitting: Physical Therapy

## 2019-06-29 ENCOUNTER — Other Ambulatory Visit: Payer: Self-pay

## 2019-06-29 ENCOUNTER — Ambulatory Visit (INDEPENDENT_AMBULATORY_CARE_PROVIDER_SITE_OTHER): Payer: Medicare HMO

## 2019-06-29 ENCOUNTER — Encounter (HOSPITAL_COMMUNITY): Payer: Medicare HMO

## 2019-06-29 ENCOUNTER — Other Ambulatory Visit (INDEPENDENT_AMBULATORY_CARE_PROVIDER_SITE_OTHER): Payer: Self-pay | Admitting: Orthopedic Surgery

## 2019-06-29 ENCOUNTER — Ambulatory Visit (INDEPENDENT_AMBULATORY_CARE_PROVIDER_SITE_OTHER): Payer: Medicare HMO | Admitting: Orthopedic Surgery

## 2019-06-29 DIAGNOSIS — M79641 Pain in right hand: Secondary | ICD-10-CM

## 2019-06-29 NOTE — Telephone Encounter (Signed)
Please advise 

## 2019-06-30 ENCOUNTER — Encounter (HOSPITAL_COMMUNITY): Payer: Medicare HMO

## 2019-06-30 ENCOUNTER — Encounter: Payer: Self-pay | Admitting: Orthopedic Surgery

## 2019-06-30 NOTE — Progress Notes (Signed)
Office Visit Note   Patient: Harold Bailey           Date of Birth: 1966/07/29           MRN: [id number] Visit Date: 06/29/2019              Requested by: Everardo Beals, NP [address] [place],  [place] [address] PCP: Everardo Beals, NP  Chief Complaint  Patient presents with  . Right Shoulder - Pain  . Right Hand - Pain      HPI: Patient is a 53 year old gentleman who states that he has pain from the right hand that radiates up to his neck.  Patient states that he had blunt trauma and pain is primarily over the fourth and fifth metacarpals.  He states he did go to the emergency room but radiographs were not obtained.  Patient also inquires about an injection for his ankle.  Assessment & Plan: Visit Diagnoses:  1. Pain of right hand     Plan: Discussed that with his history of infection injection would not be recommended.  Radiographs shows no fractures recommended using an exercise ball to work on the range of motion of his hand decrease the swelling.  A prescription was called in for meloxicam.  Follow-Up Instructions: Return if symptoms worsen or fail to improve.   Ortho Exam  Patient is alert, oriented, no adenopathy, well-dressed, normal affect, normal respiratory effort. Examination patient does have swelling of the right hand he has full range of motion of all digits with full extension and flexion.  There is swelling.  He has no tenderness palpation over the scaphoid scapholunate distal radius and ulna are nontender to palpation.  He does have tenderness palpation over the fourth and fifth metacarpals radiographs shows no fractures.  Examination of the ankle shows an intact incision venous stasis changes but no ulcers no cellulitis no swelling.  Imaging: Xr Hand Complete Right  Result Date: 06/30/2019 3 view radiographs of the right hand shows an old scaphoid fracture with internal fixation no complicating features no new fractures.  No images are  attached to the encounter.  Labs: Lab Results  Component Value Date   HGBA1C 6.3 (H) 06/10/2017   HGBA1C 6.3 (H) 05/28/2017   HGBA1C 5.9 (H) 11/22/2013   REPTSTATUS 05/21/2017 FINAL 05/16/2017   REPTSTATUS 05/21/2017 FINAL 05/16/2017   GRAMSTAIN  05/11/2017    ABUNDANT WBC PRESENT, PREDOMINANTLY PMN FEW GRAM NEGATIVE RODS    CULT NO GROWTH 5 DAYS 05/16/2017   CULT NO GROWTH 5 DAYS 05/16/2017   LABORGA METHICILLIN RESISTANT STAPHYLOCOCCUS AUREUS 05/11/2017     Lab Results  Component Value Date   ALBUMIN 4.4 05/08/2018   ALBUMIN 2.3 (L) 05/28/2017   ALBUMIN 2.8 (L) 05/12/2017    Lab Results  Component Value Date   MG 2.0 05/28/2017   MG 2.3 05/23/2017   MG 2.2 05/21/2017   No results found for: VD25OH  No results found for: PREALBUMIN CBC EXTENDED Latest Ref Rng & Units 05/08/2018 06/08/2017 05/30/2017  WBC 4.0 - 10.5 K/uL 7.7 8.2 7.7  RBC 4.22 - 5.81 Mil/uL 5.78 4.78 4.02(L)  HGB 13.0 - 17.0 g/dL 17.8(H) 15.4 13.1  HCT 39.0 - 52.0 % 53.6(H) 48.4 39.9  PLT 150.0 - 400.0 K/uL 162.0 193 123(L)  NEUTROABS 1.7 - 7.7 K/uL - - 4.8  LYMPHSABS 0.7 - 4.0 K/uL - - 2.2     There is no height or weight on file to calculate BMI.  Orders:  Orders Placed This Encounter  Procedures  . XR Hand Complete Right   No orders of the defined types were placed in this encounter.    Procedures: No procedures performed  Clinical Data: No additional findings.  ROS:  All other systems negative, except as noted in the HPI. Review of Systems  Objective: Vital Signs: There were no vitals taken for this visit.  Specialty Comments:  No specialty comments available.  PMFS History: Patient Active Problem List   Diagnosis Date Noted  . Positive colorectal cancer screening using Cologuard test   . Otitis media, left 10/15/2018  . On home oxygen therapy 08/26/2018  . Difficult intravenous access   . Chronic respiratory failure with hypoxia ([facility])   . Essential hypertension  05/11/2017  . Plantar fasciitis of left foot 01/14/2017  . Traumatic arthritis of ankle, right 01/07/2017  . H/O ankle fusion 11/14/2016  . 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 At risk for adverse drug event 10/08/2016  . History of snoring 10/08/2016  . Subtalar joint instability, left   . 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 History of alcohol abuse 07/02/2016  . COPD exacerbation ([facility]) 07/02/2016  . Tobacco dependence 06/06/2015  . Medication overuse headache 02/28/2015  . Localization-related symptomatic epilepsy and epileptic syndromes with complex partial seizures, not intractable, without status epilepticus ([place]) 02/28/2015  . Cognitive impairment 02/28/2015  . Tobacco abuse 02/28/2015  . Brain tumor ([facility]) 11/19/2013  . Seizures ([facility]) 11/19/2013  . Memory loss   . HA (headache)   . Gait disturbance   . Falls frequently 10/15/2013  . Swelling 09/09/2013  . Rash and nonspecific skin eruption 09/09/2013  . Ankle fracture, lateral malleolus, closed 09/03/2013  . Unspecified vitamin D deficiency 08/12/2013  . Polycythemia   . Memory deficit   . History of drug abuse ([facility])   . GERD (gastroesophageal reflux disease)   . S/P tricuspid valve repair   . Arthritis   . Hyperlipidemia   . Prediabetes    Past Medical History:  Diagnosis Date  . Anxiety   . Arthritis    Left ankle, hand  . COPD (chronic obstructive pulmonary disease) ([facility])   . COPD (chronic obstructive pulmonary disease) ([facility])   . Drug abuse ([place])   . DVT (deep venous thrombosis) ([facility])    Bilateral legs  . Gait disturbance   . GERD (gastroesophageal reflux disease)   . 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  Marland Kitchen History of ankle surgery   . History of brain tumor   . Hyperlipidemia   . Hypertension   . 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 Memory loss   . On home oxygen therapy 08/26/2018   at night while  lying down  . Peripheral vision loss    Left due to brain surgery  . Pneumonia   . Polycythemia   . Pre-diabetes   . Seizures ([facility]) 11/19/2013   none since 2016  . Shortness of breath    Hx of smoking.  Marland Kitchen SVT (supraventricular tachycardia) ([facility])   . Wears dentures   . Wears glasses     Family History  Problem Relation Age of Onset  . Hypertension Mother   . Bronchitis Mother   . Cancer Maternal Aunt   . Cancer Maternal Grandmother   . Stroke Neg Hx   . Diabetes Neg Hx     Past Surgical History:  Procedure Laterality Date  . ABDOMINAL AORTOGRAM W/LOWER EXTREMITY N/A 05/30/2017   Procedure: ABDOMINAL AORTOGRAM W/LOWER EXTREMITY;  Surgeon: Elam Dutch, MD;  Location: [facility];  Service: Cardiovascular;  Laterality: N/A;  . ANKLE ARTHROSCOPY  01/17/2012   Procedure: ANKLE ARTHROSCOPY;  Surgeon: [name], MD;  Location: [facility];  Service: Orthopedics;  Laterality: Left;  . ANKLE ARTHROSCOPY WITH FUSION Left 10/02/2016   Procedure: Left Posterior Subtalar Arthrodesis Arthroscopy;  Surgeon: [name], MD;  Location: [facility];  Service: Orthopedics;  Laterality: Left;  . ANKLE FUSION Left 11/2012   Dr Sharol Given  . ANKLE SURGERY  2003   Left  . BRAIN SURGERY  2009   Biopsy  . COLONOSCOPY WITH PROPOFOL N/A 03/11/2019   Procedure: COLONOSCOPY WITH PROPOFOL;  Surgeon: Mauri Pole, MD;  Location: [facility];  Service: Endoscopy;  Laterality: N/A;  . FRACTURE SURGERY  2003   Left ankle  . HARDWARE REMOVAL Left 12/08/2012   Procedure: HARDWARE REMOVAL;  Surgeon: [name], MD;  Location: [facility];  Service: Orthopedics;  Laterality: Left;  Removal Deep Hardware, Take Down Non-Union, Revision Internal Fixation Left Ankle  . I&D EXTREMITY Left 01/01/2013   Procedure: IRRIGATION AND DEBRIDEMENT EXTREMITY;  Surgeon: [name], MD;  Location: [facility];  Service: Orthopedics;  Laterality: Left;  Irrigation, Debridement and Placement Antibiotic Beads Left Ankle  . INNER EAR  SURGERY    . OPEN REDUCTION INTERNAL FIXATION (ORIF) SCAPHOID WITH DISTAL RADIUS GRAFT Right 02/01/2013   Procedure: OPEN REDUCTION INTERNAL FIXATION (ORIF) RIGHT SCAPHOID FRACTURE WITH DISTAL RADIUS GRAFT;  Surgeon: Schuyler Amor, MD;  Location: [facility];  Service: Orthopedics;  Laterality: Right;  . ORIF ANKLE FRACTURE Left 12/08/2012   Procedure: OPEN REDUCTION INTERNAL FIXATION (ORIF) ANKLE FRACTURE;  Surgeon: [name], MD;  Location: [facility];  Service: Orthopedics;  Laterality: Left;  Removal Deep Hardware, Take Down Non-Union, Revision Internal Fixation Left Ankle  . ORIF ANKLE FRACTURE Right 09/03/2013   Procedure: OPEN REDUCTION INTERNAL FIXATION (ORIF) ANKLE FRACTURE;  Surgeon: [name], MD;  Location: [facility];  Service: Orthopedics;  Laterality: Right;  Open Reduction Internal Fixation Right Fibula  . POLYPECTOMY  03/11/2019   Procedure: POLYPECTOMY;  Surgeon: Mauri Pole, MD;  Location: [facility];  Service: Endoscopy;;  . SCAPHOID FRACTURE SURGERY  02/04/2013  . SHOULDER ARTHROSCOPY  2008   Left  . TYMPANOSTOMY TUBE PLACEMENT  1993   Social History   Occupational History    Employer: NOT EMPLOYED    Comment: Disabled  Tobacco Use  . Smoking status: Current Every Day Smoker    Packs/day: 1.00    Years: 40.00    Pack years: 40.00    Types: Cigarettes  . Smokeless tobacco: Never Used  Substance and Sexual Activity  . Alcohol use: No    Alcohol/week: 0.0 standard drinks    Comment: quit Oct. 2017(heavy drinker) .In [place]  . Drug use: No  . Sexual activity: Not Currently

## 2019-07-01 ENCOUNTER — Encounter (HOSPITAL_COMMUNITY): Payer: Medicare HMO

## 2019-07-05 ENCOUNTER — Encounter (HOSPITAL_COMMUNITY): Payer: Medicare HMO | Admitting: Physical Therapy

## 2019-07-06 ENCOUNTER — Encounter (HOSPITAL_COMMUNITY): Payer: Medicare HMO

## 2019-07-06 ENCOUNTER — Telehealth (HOSPITAL_COMMUNITY): Payer: Self-pay | Admitting: Physical Therapy

## 2019-07-06 ENCOUNTER — Ambulatory Visit (HOSPITAL_COMMUNITY): Payer: Medicare HMO | Admitting: Physical Therapy

## 2019-07-06 NOTE — Telephone Encounter (Signed)
pt called to cancel today's appt due to he hurt his back

## 2019-07-07 ENCOUNTER — Encounter (HOSPITAL_COMMUNITY): Payer: Medicare HMO | Admitting: Physical Therapy

## 2019-07-08 ENCOUNTER — Encounter (HOSPITAL_COMMUNITY): Payer: Medicare HMO

## 2019-07-12 ENCOUNTER — Encounter (HOSPITAL_COMMUNITY): Payer: Medicare HMO | Admitting: Physical Therapy

## 2019-07-13 ENCOUNTER — Encounter (HOSPITAL_COMMUNITY): Payer: Medicare HMO

## 2019-07-14 ENCOUNTER — Encounter (HOSPITAL_COMMUNITY): Payer: Medicare HMO | Admitting: Physical Therapy

## 2019-07-15 ENCOUNTER — Encounter (HOSPITAL_COMMUNITY): Payer: Medicare HMO

## 2019-07-20 ENCOUNTER — Other Ambulatory Visit: Payer: Self-pay

## 2019-07-20 ENCOUNTER — Encounter (HOSPITAL_COMMUNITY): Payer: Medicare HMO

## 2019-07-20 ENCOUNTER — Ambulatory Visit (HOSPITAL_COMMUNITY): Payer: Medicare HMO | Attending: Orthopedic Surgery | Admitting: Physical Therapy

## 2019-07-20 ENCOUNTER — Encounter (HOSPITAL_COMMUNITY): Payer: Self-pay | Admitting: Physical Therapy

## 2019-07-20 DIAGNOSIS — R2689 Other abnormalities of gait and mobility: Secondary | ICD-10-CM

## 2019-07-20 DIAGNOSIS — R2681 Unsteadiness on feet: Secondary | ICD-10-CM | POA: Diagnosis present

## 2019-07-20 DIAGNOSIS — M79605 Pain in left leg: Secondary | ICD-10-CM | POA: Diagnosis present

## 2019-07-20 NOTE — Therapy (Signed)
[facility] [address] [place], [place], [address] Phone: [phone]   Fax:  [phone]  Physical Therapy Treatment and ReCert  Patient Details  Name: Harold Bailey MRN: [id number] Date of Birth: 03/05/1966 Referring Provider (PT): Meridee Score, MD   Progress Note Reporting Period 05/28/19 to 07/20/19  See note below for Objective Data and Assessment of Progress/Goals.       Encounter Date: 07/20/2019  PT End of Session - 07/20/19 1541    Visit Number  3    Number of Visits  20    Date for PT Re-Evaluation  --    Authorization Type  Primary: [employer] HMO; Secondary: Medicaid    Authorization Time Period  05/28/19 - 06/25/19 NEW POC DATES 07/20/19 to 08/31/2019    Authorization - Visit Number  1    Authorization - Number of Visits  10    PT Start Time  [id number]    PT Stop Time  1616    PT Time Calculation (min)  45 min    Equipment Utilized During Treatment  Gait belt    Activity Tolerance  Patient tolerated treatment well;No increased pain    Behavior During Therapy  WFL for tasks assessed/performed       Past Medical History:  Diagnosis Date  . Anxiety   . Arthritis    Left ankle, hand  . COPD (chronic obstructive pulmonary disease) ([place])   . COPD (chronic obstructive pulmonary disease) ([place])   . Drug abuse ([place])   . DVT (deep venous thrombosis) ([facility])    Bilateral legs  . Gait disturbance   . GERD (gastroesophageal reflux disease)   . 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  Marland Kitchen History of ankle surgery   . History of brain tumor   . Hyperlipidemia   . Hypertension   . 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 Memory loss   . On home oxygen therapy 08/26/2018   at night while lying down  . Peripheral vision loss    Left due to brain surgery  . Pneumonia   . Polycythemia   . Pre-diabetes   . Seizures ([place]) 11/19/2013   none since 2016   . Shortness of breath    Hx of smoking.  Marland Kitchen SVT (supraventricular tachycardia) ([place])   . Wears dentures   . Wears glasses     Past Surgical History:  Procedure Laterality Date  . ABDOMINAL AORTOGRAM W/LOWER EXTREMITY N/A 05/30/2017   Procedure: ABDOMINAL AORTOGRAM W/LOWER EXTREMITY;  Surgeon: Elam Dutch, MD;  Location: [facility];  Service: Cardiovascular;  Laterality: N/A;  . ANKLE ARTHROSCOPY  01/17/2012   Procedure: ANKLE ARTHROSCOPY;  Surgeon: [name], MD;  Location: [facility];  Service: Orthopedics;  Laterality: Left;  . ANKLE ARTHROSCOPY WITH FUSION Left 10/02/2016   Procedure: Left Posterior Subtalar Arthrodesis Arthroscopy;  Surgeon: [name], MD;  Location: [facility];  Service: Orthopedics;  Laterality: Left;  . ANKLE FUSION Left 11/2012   Dr Sharol Given  . ANKLE SURGERY  2003   Left  . BRAIN SURGERY  2009   Biopsy  . COLONOSCOPY WITH PROPOFOL N/A 03/11/2019   Procedure: COLONOSCOPY WITH PROPOFOL;  Surgeon: Mauri Pole, MD;  Location: [facility];  Service: Endoscopy;  Laterality: N/A;  . FRACTURE SURGERY  2003   Left ankle  . HARDWARE REMOVAL Left 12/08/2012   Procedure:  HARDWARE REMOVAL;  Surgeon: [name], MD;  Location: [facility];  Service: Orthopedics;  Laterality: Left;  Removal Deep Hardware, Take Down Non-Union, Revision Internal Fixation Left Ankle  . I&D EXTREMITY Left 01/01/2013   Procedure: IRRIGATION AND DEBRIDEMENT EXTREMITY;  Surgeon: [name], MD;  Location: [facility];  Service: Orthopedics;  Laterality: Left;  Irrigation, Debridement and Placement Antibiotic Beads Left Ankle  . INNER EAR SURGERY    . OPEN REDUCTION INTERNAL FIXATION (ORIF) SCAPHOID WITH DISTAL RADIUS GRAFT Right 02/01/2013   Procedure: OPEN REDUCTION INTERNAL FIXATION (ORIF) RIGHT SCAPHOID FRACTURE WITH DISTAL RADIUS GRAFT;  Surgeon: Schuyler Amor, MD;  Location: [facility];  Service: Orthopedics;  Laterality: Right;  . ORIF ANKLE FRACTURE Left 12/08/2012   Procedure: OPEN  REDUCTION INTERNAL FIXATION (ORIF) ANKLE FRACTURE;  Surgeon: [name], MD;  Location: [facility];  Service: Orthopedics;  Laterality: Left;  Removal Deep Hardware, Take Down Non-Union, Revision Internal Fixation Left Ankle  . ORIF ANKLE FRACTURE Right 09/03/2013   Procedure: OPEN REDUCTION INTERNAL FIXATION (ORIF) ANKLE FRACTURE;  Surgeon: [name], MD;  Location: [facility];  Service: Orthopedics;  Laterality: Right;  Open Reduction Internal Fixation Right Fibula  . POLYPECTOMY  03/11/2019   Procedure: POLYPECTOMY;  Surgeon: Mauri Pole, MD;  Location: [facility];  Service: Endoscopy;;  . SCAPHOID FRACTURE SURGERY  02/04/2013  . SHOULDER ARTHROSCOPY  2008   Left  . TYMPANOSTOMY TUBE PLACEMENT  1993    There were no vitals filed for this visit.  Subjective Assessment - 07/20/19 1525    Subjective  States that he fell about a month ago and that he really hurt his right hand. States that they did an xray and didn't find anything wrong with his hand but it is real sore. Reports his legs are just real sore all over with getting up and down. States that he did just buy a treadmill for home because he quit going to pulmonary rehab as it was too much for him to schedule in. States he really wants to focus on his PT and work on his hip pain, balance and functional endurance.    Pertinent History  HTN, COPD, Brain Tumor, Epilepsy seizures, history of left ankle fractures and fusion in 2018, left peripheral vision loss    Limitations  Walking;House hold activities    Diagnostic tests  MRI 05/22/19: Mild L5-S1 Disc Degeneration    Patient Stated Goals  To have less pain    Currently in Pain?  Yes    Pain Score  4     Pain Location  Leg    Pain Orientation  Right    Pain Descriptors / Indicators  Dull    Pain Radiating Towards  along right hip before fall.    Pain Onset  More than a month ago    Pain Frequency  Intermittent    Aggravating Factors   si    Pain Relieving Factors  Takes BCs  multiple times a day (2-4 times a day) despite MD suggesting otherwise         [facility] PT Assessment - 07/20/19 0001      Assessment   Medical Diagnosis  Acute Left-sided LBP with left sided sciatica    Referring Provider (PT)  Meridee Score, MD    Onset Date/Surgical Date  --   3 years   Next MD Visit  Unknown    Prior Therapy  Yes, for ankle      Balance Screen  Has the patient fallen in the past 6 months  Yes    How many times?  1   5 weeks ago   Has the patient had a decrease in activity level because of a fear of falling?   Yes    Is the patient reluctant to leave their home because of a fear of falling?   No      Home Environment   Living Environment  Private residence    Living Arrangements  Spouse/significant other    Type of [place] Access  Level entry    Home Layout  Multi-level    Alternate Level Stairs-Number of Steps  8    Alternate Level Stairs-Rails  Can reach both    Home Equipment  Wheelchair - manual;Walker - 2 wheels;Cane - single point      Prior Function   Level of Independence  Independent with basic ADLs;Independent with household mobility without device    Vocation  On disability      Cognition   Overall Cognitive Status  History of cognitive impairments - at baseline      Observation/Other Assessments   Focus on Therapeutic Outcomes (FOTO)   53% limited      AROM   Overall AROM Comments  Left ankle fused limited ROM      Strength   Right Hip Flexion  4-/5    Right Hip Extension  4-/5    Right Hip ABduction  4/5    Left Hip Flexion  4-/5    Left Hip Extension  4-/5    Left Hip ABduction  4/5    Right Knee Flexion  4/5    Right Knee Extension  4+/5    Left Knee Flexion  5/5    Left Knee Extension  5/5    Right Ankle Dorsiflexion  4+/5    Left Ankle Dorsiflexion  1/5   Ankle fusion limiting movement     Ambulation/Gait   Ambulation/Gait  Yes    Ambulation/Gait Assistance  5: Supervision    Ambulation/Gait Assistance Details   lateral sway, hyperextension on the left side, 2 almost LOB    would benefit from cane   Ambulation Distance (Feet)  305 Feet    Gait Pattern  Step-through pattern;Left foot flat;Lateral trunk lean to right    Ambulation Surface  Level;Indoor    Gait Comments  monitored O2 throughout - decreased to 84% after ambulation       Static Standing Balance   Static Standing - Balance Support  No upper extremity supported    Static Standing Balance -  Activities   Single Leg Stance - Right Leg;Single Leg Stance - Left Leg    Static Standing - Comment/# of Minutes  L 1 seconds, R 2 seconds                   [facility] Adult PT Treatment/Exercise - 07/20/19 0001      Knee/Hip Exercises: Standing   Other Standing Knee Exercises  narrow BOS with cervical ROT 3x5             PT Education - 07/20/19 1718    Education Details  cessation of soking- how it impairs breathing and other body functions when O2 saturation decreases (used pictures to hep with anatomy), use of cane with ambulation secondary to recent fall and continued LOB with ambulation. focus on exhale with breathing - longer then inhale    Person(s) Educated  Patient  Methods  Explanation    Comprehension  Verbalized understanding       PT Short Term Goals - 07/20/19 1721      PT SHORT TERM GOAL #1   Title  Patient will report understanding and regular compliance with HEP to improve strength mobility and decrease pain.    Time  2    Period  Weeks    Status  On-going    Target Date  06/11/19        PT Long Term Goals - 07/20/19 1550      PT LONG TERM GOAL #1   Title  Patient will report that his pain has not exceeded a 2/10 over a 1 week period indicating improved tolerance to daily activities.    Time  4    Period  Weeks    Status  On-going      PT LONG TERM GOAL #2   Title  Patient will demonstrate ability to maintain single limb stance for at least 3 seconds on each lower extremity indicating improved  balance for safety with stair negotiation.    Time  4    Period  Weeks    Status  On-going      PT LONG TERM GOAL #3   Title  Patient will report that his symptoms have not extended past his knee for at least 1 week indicating improved centralization of his pain.    Baseline  07/20/19 no anterior thigh pain anymore    Time  4    Period  Weeks    Status  Achieved      PT LONG TERM GOAL #4   Title  Patient will be educated on improved gait mechanics, as well as any necessary AD to decrease excessive deviations and will demonstrate understanding with minimal to no verbal cues to improve safety and decrease adverse forces with ambulation.    Time  4    Period  Weeks    Status  On-going      PT LONG TERM GOAL #5   Title  Patient will report decrease in cigarette use by at least 25% to transition towards complete cessation of smoking.    Time  6    Period  Weeks    Status  New    Target Date  08/31/19            Plan - 07/20/19 1727    Clinical Impression Statement  Patient present for a reassessment on this date. He has been treated in physical therapy for 3 visits since his evaluation on 05/28/19. He was too busy to attend physical therapy with him going to pulmonary rehab 3 times a week and he had fallen about 5 weeks ago which set him back with all participation in physical activity. Patient since the fall has bought a treadmill and is planning on performing pulmonary rehab at home and would like to restart his physical therapy at this time. Reviewed patient's current physical presentation and thoroughly educated patient on importance of HEP and POC compliance for improvement in overall functional mobility. Strength has decreased since initial evaluation, but this is to be expected with 5 weeks of rest after falling. Extending POC and added additional long term goals to work towards patient's current goals. Patient would benefit from skilled physical therapee to improve overall function  and decrease fall risk.    Personal Factors and Comorbidities  Time since onset of injury/illness/exacerbation;Comorbidity 3+    Comorbidities  HTN, COPD, brain tumor  Examination-Activity Limitations  Squat;Stairs;Locomotion Level;Transfers    Examination-Participation Restrictions  Community Activity;Other   Sleeping   Stability/Clinical Decision Making  Evolving/Moderate complexity    Rehab Potential  Fair    PT Frequency  2x / week    PT Duration  6 weeks    PT Treatment/Interventions  ADLs/Self Care Home Management;Aquatic Therapy;Cryotherapy;Electrical Stimulation;Moist Heat;Traction;DME Instruction;Gait training;Stair training;Functional mobility training;Therapeutic activities;Therapeutic exercise;Balance training;Neuromuscular re-education;Patient/family education;Manual techniques;Passive range of motion;Dry needling;Energy conservation;Taping    PT Next Visit Plan  Monitor SpO2 during entire session and note left visual field loss; strengthen LE, continue to monitor/assess pain of left LE    PT Home Exercise Plan  9/11 - sit to stands, clams    Consulted and Agree with Plan of Care  Patient       Patient will benefit from skilled therapeutic intervention in order to improve the following deficits and impairments:  Abnormal gait, Improper body mechanics, Pain, Decreased mobility, Postural dysfunction, Decreased activity tolerance, Decreased endurance, Decreased range of motion, Decreased strength, Hypomobility, Decreased balance, Difficulty walking  Visit Diagnosis: Pain in left leg  Unsteadiness on feet  Other abnormalities of gait and mobility     Problem List Patient Active Problem List   Diagnosis Date Noted  . Positive colorectal cancer screening using Cologuard test   . Otitis media, left 10/15/2018  . On home oxygen therapy 08/26/2018  . Difficult intravenous access   . Chronic respiratory failure with hypoxia ([place])   . Essential hypertension 05/11/2017  .  Plantar fasciitis of left foot 01/14/2017  . Traumatic arthritis of ankle, right 01/07/2017  . H/O ankle fusion 11/14/2016  . 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 At risk for adverse drug event 10/08/2016  . History of snoring 10/08/2016  . Subtalar joint instability, left   . 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 History of alcohol abuse 07/02/2016  . COPD exacerbation ([place]) 07/02/2016  . Tobacco dependence 06/06/2015  . Medication overuse headache 02/28/2015  . Localization-related symptomatic epilepsy and epileptic syndromes with complex partial seizures, not intractable, without status epilepticus ([place]) 02/28/2015  . Cognitive impairment 02/28/2015  . Tobacco abuse 02/28/2015  . Brain tumor ([place]) 11/19/2013  . Seizures ([place]) 11/19/2013  . Memory loss   . HA (headache)   . Gait disturbance   . Falls frequently 10/15/2013  . Swelling 09/09/2013  . Rash and nonspecific skin eruption 09/09/2013  . Ankle fracture, lateral malleolus, closed 09/03/2013  . Unspecified vitamin D deficiency 08/12/2013  . Polycythemia   . Memory deficit   . History of drug abuse ([place])   . GERD (gastroesophageal reflux disease)   . S/P tricuspid valve repair   . Arthritis   . Hyperlipidemia   . Prediabetes     5:35 PM, 07/20/19 Jerene Pitch, DPT Physical Therapy with [facility]  [phone] office  [facility] [address] [place], [place], [address] Phone: [phone]   Fax:  [phone]  Name: Harold Bailey MRN: [id number] Date of Birth: [birth year]

## 2019-07-22 ENCOUNTER — Encounter (HOSPITAL_COMMUNITY): Payer: Medicare HMO

## 2019-07-24 ENCOUNTER — Other Ambulatory Visit: Payer: Self-pay | Admitting: Neurology

## 2019-07-26 NOTE — Telephone Encounter (Signed)
Requested Prescriptions   Pending Prescriptions Disp Refills  . gabapentin (NEURONTIN) 300 MG capsule [Pharmacy Med Name: GABAPENTIN 300MG  CAPSULES] 60 capsule 5    Sig: TAKE 1 CAPSULE(300 MG) BY MOUTH TWICE DAILY   Rx last filled: 12/07/18 #60 5 refills  Pt last seen: 01/20/19   Follow up appt scheduled:07/29/19

## 2019-07-27 ENCOUNTER — Encounter (HOSPITAL_COMMUNITY): Payer: Medicare HMO

## 2019-07-27 ENCOUNTER — Ambulatory Visit (HOSPITAL_COMMUNITY): Payer: Medicare HMO | Attending: Orthopedic Surgery

## 2019-07-27 ENCOUNTER — Encounter (HOSPITAL_COMMUNITY): Payer: Self-pay

## 2019-07-27 ENCOUNTER — Other Ambulatory Visit: Payer: Self-pay

## 2019-07-27 VITALS — HR 115

## 2019-07-27 DIAGNOSIS — R2681 Unsteadiness on feet: Secondary | ICD-10-CM | POA: Insufficient documentation

## 2019-07-27 DIAGNOSIS — R2689 Other abnormalities of gait and mobility: Secondary | ICD-10-CM | POA: Diagnosis present

## 2019-07-27 DIAGNOSIS — M79605 Pain in left leg: Secondary | ICD-10-CM | POA: Diagnosis present

## 2019-07-27 NOTE — Therapy (Signed)
[facility] [place], [place], [address] Phone: [phone]   Fax:  [phone]  Physical Therapy Treatment  Patient Details  Name: Harold Bailey MRN: [id number] Date of Birth: 15-Dec-1965 Referring Provider (PT): Meridee Score, MD   Encounter Date: 07/27/2019  PT End of Session - 07/27/19 1712    Visit Number  4    Number of Visits  20    Date for PT Re-Evaluation  08/31/19    Authorization Type  Primary: [employer] HMO; Secondary: Medicaid    Authorization Time Period  05/28/19 - 06/25/19 NEW POC DATES 07/20/19 to 08/31/2019    PT Start Time  [id number]    PT Stop Time  1748    PT Time Calculation (min)  43 min    Equipment Utilized During Treatment  Gait belt    Activity Tolerance  Patient tolerated treatment well;No increased pain    Behavior During Therapy  WFL for tasks assessed/performed       Past Medical History:  Diagnosis Date  . Anxiety   . Arthritis    Left ankle, hand  . COPD (chronic obstructive pulmonary disease) ([place])   . COPD (chronic obstructive pulmonary disease) ([place])   . Drug abuse ([place])   . DVT (deep venous thrombosis) ([facility])    Bilateral legs  . Gait disturbance   . GERD (gastroesophageal reflux disease)   . 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  Marland Kitchen History of ankle surgery   . History of brain tumor   . Hyperlipidemia   . Hypertension   . 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 Memory loss   . On home oxygen therapy 08/26/2018   at night while lying down  . Peripheral vision loss    Left due to brain surgery  . Pneumonia   . Polycythemia   . Pre-diabetes   . Seizures ([place]) 11/19/2013   none since 2016  . Shortness of breath    Hx of smoking.  Marland Kitchen SVT (supraventricular tachycardia) ([place])   . Wears dentures   . Wears glasses     Past Surgical History:  Procedure Laterality Date  . ABDOMINAL AORTOGRAM W/LOWER  EXTREMITY N/A 05/30/2017   Procedure: ABDOMINAL AORTOGRAM W/LOWER EXTREMITY;  Surgeon: Elam Dutch, MD;  Location: [facility];  Service: Cardiovascular;  Laterality: N/A;  . ANKLE ARTHROSCOPY  01/17/2012   Procedure: ANKLE ARTHROSCOPY;  Surgeon: [name], MD;  Location: [facility];  Service: Orthopedics;  Laterality: Left;  . ANKLE ARTHROSCOPY WITH FUSION Left 10/02/2016   Procedure: Left Posterior Subtalar Arthrodesis Arthroscopy;  Surgeon: [name], MD;  Location: [facility];  Service: Orthopedics;  Laterality: Left;  . ANKLE FUSION Left 11/2012   Dr Sharol Given  . ANKLE SURGERY  2003   Left  . BRAIN SURGERY  2009   Biopsy  . COLONOSCOPY WITH PROPOFOL N/A 03/11/2019   Procedure: COLONOSCOPY WITH PROPOFOL;  Surgeon: Mauri Pole, MD;  Location: [facility];  Service: Endoscopy;  Laterality: N/A;  . FRACTURE SURGERY  2003   Left ankle  . HARDWARE REMOVAL Left 12/08/2012   Procedure: HARDWARE REMOVAL;  Surgeon: [name], MD;  Location: [facility];  Service: Orthopedics;  Laterality: Left;  Removal Deep Hardware, Take Down Non-Union, Revision Internal Fixation Left Ankle  . I&D EXTREMITY Left 01/01/2013   Procedure: IRRIGATION AND DEBRIDEMENT EXTREMITY;  Surgeon:  [name], MD;  Location: [facility];  Service: Orthopedics;  Laterality: Left;  Irrigation, Debridement and Placement Antibiotic Beads Left Ankle  . INNER EAR SURGERY    . OPEN REDUCTION INTERNAL FIXATION (ORIF) SCAPHOID WITH DISTAL RADIUS GRAFT Right 02/01/2013   Procedure: OPEN REDUCTION INTERNAL FIXATION (ORIF) RIGHT SCAPHOID FRACTURE WITH DISTAL RADIUS GRAFT;  Surgeon: Schuyler Amor, MD;  Location: [facility];  Service: Orthopedics;  Laterality: Right;  . ORIF ANKLE FRACTURE Left 12/08/2012   Procedure: OPEN REDUCTION INTERNAL FIXATION (ORIF) ANKLE FRACTURE;  Surgeon: [name], MD;  Location: [facility];  Service: Orthopedics;  Laterality: Left;  Removal Deep Hardware, Take Down Non-Union, Revision Internal Fixation Left Ankle   . ORIF ANKLE FRACTURE Right 09/03/2013   Procedure: OPEN REDUCTION INTERNAL FIXATION (ORIF) ANKLE FRACTURE;  Surgeon: [name], MD;  Location: [facility];  Service: Orthopedics;  Laterality: Right;  Open Reduction Internal Fixation Right Fibula  . POLYPECTOMY  03/11/2019   Procedure: POLYPECTOMY;  Surgeon: Mauri Pole, MD;  Location: [facility];  Service: Endoscopy;;  . SCAPHOID FRACTURE SURGERY  02/04/2013  . SHOULDER ARTHROSCOPY  2008   Left  . TYMPANOSTOMY TUBE PLACEMENT  1993    Vitals:   07/27/19 1709  Pulse: (!) 115  SpO2: 92%    Subjective Assessment - 07/27/19 1709    Subjective  Pt stated he has done a lot of breathing today, arrived without AD.  Has reduced smoking cigarrettes to 1 packet every other day (used to smoke 2-3 packs a day).    Pertinent History  HTN, COPD, Brain Tumor, Epilepsy seizures, history of left ankle fractures and fusion in 2018, left peripheral vision loss    Patient Stated Goals  To have less pain    Currently in Pain?  Yes    Pain Score  6     Pain Location  Ankle    Pain Orientation  Left    Pain Descriptors / Indicators  Tightness    Pain Onset  More than a month ago    Pain Frequency  Intermittent    Pain Relieving Factors  Takes BC multiple times a day (2-4 times a day) despite MD suggesting otherwise                       [facility] Adult PT Treatment/Exercise - 07/27/19 0001      Ambulation/Gait   Ambulation/Gait  Yes    Ambulation/Gait Assistance  5: Supervision    Ambulation/Gait Assistance Details  SPC    Ambulation Distance (Feet)  226 Feet    Gait Pattern  Step-through pattern;Left foot flat;Lateral trunk lean to right    Ambulation Surface  Level;Indoor    Gait Comments  monitored O2 throughout - decreased to 84% after ambulation       Exercises   Exercises  Knee/Hip      Knee/Hip Exercises: Standing   Heel Raises  10 reps    Heel Raises Limitations  decreased range Lt fused ankle    Hip Abduction   Both;Knee straight;2 sets;5 reps   3" holds   Gait Training  Gait training for sequence wiht SPC    Other Standing Knee Exercises  partial tandem stance with cervical rot 10x    Other Standing Knee Exercises  narrow BOS with cervical ROT 3x5      Knee/Hip Exercises: Seated   Sit to Sand  2 sets;5 reps;with UE support;Other (comment)   vitals monitored through session.  Knee/Hip Exercises: Sidelying   Clams  10x with RTB 3"             PT Education - 07/27/19 1741    Education Details  Educated on use of SPC to assist with ankle pain, improve gait mechanics and reduce risk of falls.    Person(s) Educated  Patient    Methods  Explanation;Demonstration    Comprehension  Verbalized understanding;Returned demonstration       PT Short Term Goals - 07/20/19 1721      PT SHORT TERM GOAL #1   Title  Patient will report understanding and regular compliance with HEP to improve strength mobility and decrease pain.    Time  2    Period  Weeks    Status  On-going    Target Date  06/11/19        PT Long Term Goals - 07/20/19 1550      PT LONG TERM GOAL #1   Title  Patient will report that his pain has not exceeded a 2/10 over a 1 week period indicating improved tolerance to daily activities.    Time  4    Period  Weeks    Status  On-going      PT LONG TERM GOAL #2   Title  Patient will demonstrate ability to maintain single limb stance for at least 3 seconds on each lower extremity indicating improved balance for safety with stair negotiation.    Time  4    Period  Weeks    Status  On-going      PT LONG TERM GOAL #3   Title  Patient will report that his symptoms have not extended past his knee for at least 1 week indicating improved centralization of his pain.    Baseline  07/20/19 no anterior thigh pain anymore    Time  4    Period  Weeks    Status  Achieved      PT LONG TERM GOAL #4   Title  Patient will be educated on improved gait mechanics, as well as any  necessary AD to decrease excessive deviations and will demonstrate understanding with minimal to no verbal cues to improve safety and decrease adverse forces with ambulation.    Time  4    Period  Weeks    Status  On-going      PT LONG TERM GOAL #5   Title  Patient will report decrease in cigarette use by at least 25% to transition towards complete cessation of smoking.    Time  6    Period  Weeks    Status  New    Target Date  08/31/19            Plan - 07/27/19 1806    Clinical Impression Statement  Pt arrived with reports of reduction in smoking, now down to 1 pack every other day (was 2-3 packs a day).  Pt arrived without AD wiht some [place] gait.  Pt educated on benefits of ambulating with AD.  Session focus on LE strengthening and balance training.  Pt able to complete whole session with good tolerance, no reports of pain.  Monitored vitals through session with O2 sat% range from 89-93% and HR range from 115-128bpm.    Personal Factors and Comorbidities  Time since onset of injury/illness/exacerbation;Comorbidity 3+    Comorbidities  HTN, COPD, brain tumor    Examination-Activity Limitations  Squat;Stairs;Locomotion Level;Transfers    Examination-Participation Restrictions  Community Activity;Other  Sleeping   Stability/Clinical Decision Making  Evolving/Moderate complexity    Clinical Decision Making  Moderate    Rehab Potential  Fair    PT Frequency  2x / week    PT Duration  6 weeks    PT Treatment/Interventions  ADLs/Self Care Home Management;Aquatic Therapy;Cryotherapy;Electrical Stimulation;Moist Heat;Traction;DME Instruction;Gait training;Stair training;Functional mobility training;Therapeutic activities;Therapeutic exercise;Balance training;Neuromuscular re-education;Patient/family education;Manual techniques;Passive range of motion;Dry needling;Energy conservation;Taping    PT Next Visit Plan  Monitor SpO2 during entire session and note left visual field loss;  strengthen LE, continue to monitor/assess pain of left LE    PT Home Exercise Plan  9/11 - sit to stands, clams       Patient will benefit from skilled therapeutic intervention in order to improve the following deficits and impairments:  Abnormal gait, Improper body mechanics, Pain, Decreased mobility, Postural dysfunction, Decreased activity tolerance, Decreased endurance, Decreased range of motion, Decreased strength, Hypomobility, Decreased balance, Difficulty walking  Visit Diagnosis: Unsteadiness on feet  Pain in left leg  Other abnormalities of gait and mobility     Problem List Patient Active Problem List   Diagnosis Date Noted  . Positive colorectal cancer screening using Cologuard test   . Otitis media, left 10/15/2018  . On home oxygen therapy 08/26/2018  . Difficult intravenous access   . Chronic respiratory failure with hypoxia ([place])   . Essential hypertension 05/11/2017  . Plantar fasciitis of left foot 01/14/2017  . Traumatic arthritis of ankle, right 01/07/2017  . H/O ankle fusion 11/14/2016  . 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 At risk for adverse drug event 10/08/2016  . History of snoring 10/08/2016  . Subtalar joint instability, left   . 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 History of alcohol abuse 07/02/2016  . COPD exacerbation ([place]) 07/02/2016  . Tobacco dependence 06/06/2015  . Medication overuse headache 02/28/2015  . Localization-related symptomatic epilepsy and epileptic syndromes with complex partial seizures, not intractable, without status epilepticus ([place]) 02/28/2015  . Cognitive impairment 02/28/2015  . Tobacco abuse 02/28/2015  . Brain tumor ([place]) 11/19/2013  . Seizures ([place]) 11/19/2013  . Memory loss   . HA (headache)   . Gait disturbance   . Falls frequently 10/15/2013  . Swelling 09/09/2013  . Rash and nonspecific skin eruption 09/09/2013  . Ankle fracture, lateral malleolus, closed 09/03/2013  .  Unspecified vitamin D deficiency 08/12/2013  . Polycythemia   . Memory deficit   . History of drug abuse ([place])   . GERD (gastroesophageal reflux disease)   . S/P tricuspid valve repair   . Arthritis   . Hyperlipidemia   . Prediabetes    Ihor Austin, LPTA; [place]  Aldona Lento 07/27/2019, 6:54 PM  [facility] [address] [place], [place], [address] Phone: [phone]   Fax:  [phone]  Name: Harold Bailey MRN: [id number] Date of Birth: 1965/12/27

## 2019-07-27 NOTE — Patient Instructions (Signed)
Functional Quadriceps: Sit to Stand    Sit on edge of chair, feet flat on floor. Stand upright, extending knees fully. Repeat 10 times per set. Do 2  sets per session. Do at least 4 sessions per week.    http://orth.exer.us/734   Copyright  VHI. All rights reserved.   Abduction: Clam (Eccentric) - Side-Lying    Lie on side with knees bent. Lift top knee, keeping feet together. Keep trunk steady.  Slowly lower for 3-5 seconds. 10 reps per set, 1 sets per day, 4 days per week.  http://ecce.exer.us/64   Copyright  VHI. All rights reserved.
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
## 2019-07-29 ENCOUNTER — Ambulatory Visit (INDEPENDENT_AMBULATORY_CARE_PROVIDER_SITE_OTHER): Payer: Medicare HMO | Admitting: Neurology

## 2019-07-29 ENCOUNTER — Encounter: Payer: Self-pay | Admitting: Neurology

## 2019-07-29 ENCOUNTER — Other Ambulatory Visit: Payer: Medicare HMO

## 2019-07-29 ENCOUNTER — Other Ambulatory Visit: Payer: Self-pay

## 2019-07-29 ENCOUNTER — Telehealth (HOSPITAL_COMMUNITY): Payer: Self-pay

## 2019-07-29 ENCOUNTER — Ambulatory Visit (HOSPITAL_COMMUNITY): Payer: Medicare HMO

## 2019-07-29 ENCOUNTER — Encounter (HOSPITAL_COMMUNITY): Payer: Medicare HMO

## 2019-07-29 VITALS — BP 133/92 | HR 104 | Ht 72.0 in | Wt 241.8 lb

## 2019-07-29 DIAGNOSIS — G0490 Encephalitis and encephalomyelitis, unspecified: Secondary | ICD-10-CM

## 2019-07-29 DIAGNOSIS — R4189 Other symptoms and signs involving cognitive functions and awareness: Secondary | ICD-10-CM

## 2019-07-29 DIAGNOSIS — G40209 Localization-related (focal) (partial) symptomatic epilepsy and epileptic syndromes with complex partial seizures, not intractable, without status epilepticus: Secondary | ICD-10-CM

## 2019-07-29 DIAGNOSIS — Z5181 Encounter for therapeutic drug level monitoring: Secondary | ICD-10-CM

## 2019-07-29 DIAGNOSIS — M792 Neuralgia and neuritis, unspecified: Secondary | ICD-10-CM

## 2019-07-29 LAB — CBC
HCT: 60.5 % (ref 39.0–52.0)
Hemoglobin: 20.1 g/dL (ref 13.0–17.0)
MCHC: 33.2 g/dL (ref 30.0–36.0)
MCV: 98.9 fl (ref 78.0–100.0)
Platelets: 157 10*3/uL (ref 150.0–400.0)
RBC: 6.12 Mil/uL — ABNORMAL HIGH (ref 4.22–5.81)
RDW: 15.5 % (ref 11.5–15.5)
WBC: 8.8 10*3/uL (ref 4.0–10.5)

## 2019-07-29 NOTE — Patient Instructions (Addendum)
1.  Continue Depakote ER 1000mg  daily, oxcarbazepine 300mg  twice daily, gabapentin 300mg  twice daily and amitriptyline 25mg  at bedtime 2.  Check CBC and CMP 3.  Follow up in 6 months.  Your provider has requested that you have labwork completed today. Please go to [facility] ([id number]) on the second floor of this building before leaving the office today. You do not need to check in. If you are not called within 15 minutes please check with the front desk.

## 2019-07-29 NOTE — Telephone Encounter (Signed)
They are in [place] doing blood work and will not be here today. He will call back tomorrow when his wife gets her work schedule and r/s

## 2019-07-29 NOTE — Progress Notes (Signed)
Received critical lab from [facility] at 4:15pm Pt Hemoglobin level 20.1 Hematocrit 60.5  Reported to Dr. Posey Pronto reviewed ok to wait until reviewed by Dr. Tomi Likens copy sent to PCP via Epic

## 2019-07-30 ENCOUNTER — Telehealth: Payer: Self-pay

## 2019-07-30 LAB — COMPLETE METABOLIC PANEL WITH GFR
AG Ratio: 1.6 (calc) (ref 1.0–2.5)
ALT: 21 U/L (ref 9–46)
AST: 22 U/L (ref 10–35)
Albumin: 4.5 g/dL (ref 3.6–5.1)
Alkaline phosphatase (APISO): 54 U/L (ref 35–144)
BUN: 15 mg/dL (ref 7–25)
CO2: 27 mmol/L (ref 20–32)
Calcium: 9.7 mg/dL (ref 8.6–10.3)
Chloride: 103 mmol/L (ref 98–110)
Creat: 0.89 mg/dL (ref 0.70–1.33)
GFR, Est African American: 112 mL/min/{1.73_m2} (ref >=60)
GFR, Est Non African American: 97 mL/min/{1.73_m2} (ref >=60)
Globulin: 2.8 g/dL (calc) (ref 1.9–3.7)
Glucose, Bld: 89 mg/dL (ref 65–99)
Potassium: 4.6 mmol/L (ref 3.5–5.3)
Sodium: 141 mmol/L (ref 135–146)
Total Bilirubin: 0.4 mg/dL (ref 0.2–1.2)
Total Protein: 7.3 g/dL (ref 6.1–8.1)

## 2019-07-30 NOTE — Telephone Encounter (Signed)
-----   Message from Pieter Partridge, DO sent at 07/30/2019  6:08 AM EST ----- Labs show elevated hemoglobin and hematocrit, which correlates with known polycythemia.  I would recommend that he follow up with his PCP regarding this.  Otherwise, labs are unremarkable.

## 2019-07-30 NOTE — Telephone Encounter (Signed)
Called spoke with he was made aware of provider response regarding labs. Copy of labs results sent to PCP. He will contact PCP for appt to f/u on abnomal labs

## 2019-08-03 ENCOUNTER — Telehealth (HOSPITAL_COMMUNITY): Payer: Self-pay

## 2019-08-03 ENCOUNTER — Ambulatory Visit (HOSPITAL_COMMUNITY): Payer: Medicare HMO

## 2019-08-03 ENCOUNTER — Encounter (HOSPITAL_COMMUNITY): Payer: Medicare HMO

## 2019-08-03 NOTE — Telephone Encounter (Signed)
pt called to cancel today's appt due to no transportation 

## 2019-08-04 ENCOUNTER — Telehealth: Payer: Self-pay | Admitting: Neurology

## 2019-08-04 NOTE — Telephone Encounter (Signed)
Patient called regarding his lab results. Please Call [phone]. Thanks

## 2019-08-04 NOTE — Telephone Encounter (Signed)
Called spoke with patient he was informed of results again. We spoke about this on 07/30/19. Copy of labs sent to PCP

## 2019-08-04 NOTE — Telephone Encounter (Signed)
Note   ----- Message from Pieter Partridge, DO sent at 07/30/2019  6:08 AM EST ----- Labs show elevated hemoglobin and hematocrit, which correlates with known polycythemia.  I would recommend that he follow up with his PCP regarding this.  Otherwise, labs are unremarkable.

## 2019-08-05 ENCOUNTER — Encounter (HOSPITAL_COMMUNITY): Payer: Medicare HMO

## 2019-08-05 ENCOUNTER — Ambulatory Visit (HOSPITAL_COMMUNITY): Payer: Medicare HMO

## 2019-08-05 ENCOUNTER — Telehealth (HOSPITAL_COMMUNITY): Payer: Self-pay

## 2019-08-05 NOTE — Telephone Encounter (Signed)
pt called to cancel due to he is not feeling well

## 2019-08-11 ENCOUNTER — Ambulatory Visit (HOSPITAL_COMMUNITY): Payer: Medicare HMO

## 2019-08-12 ENCOUNTER — Telehealth (HOSPITAL_COMMUNITY): Payer: Self-pay | Admitting: Physical Therapy

## 2019-08-12 ENCOUNTER — Encounter (HOSPITAL_COMMUNITY): Payer: Medicare HMO

## 2019-08-12 ENCOUNTER — Telehealth (HOSPITAL_COMMUNITY): Payer: Self-pay

## 2019-08-12 NOTE — Telephone Encounter (Signed)
pt called to cancel his appts and be discharged due to his wife is having hip and knee surgery anfd he will not be back for a while.

## 2019-09-02 ENCOUNTER — Other Ambulatory Visit: Payer: Self-pay | Admitting: Pulmonary Disease

## 2019-09-11 ENCOUNTER — Other Ambulatory Visit: Payer: Self-pay | Admitting: Neurology

## 2019-10-25 ENCOUNTER — Other Ambulatory Visit: Payer: Self-pay | Admitting: Neurology

## 2019-11-15 ENCOUNTER — Ambulatory Visit (INDEPENDENT_AMBULATORY_CARE_PROVIDER_SITE_OTHER): Payer: Medicare HMO

## 2019-11-15 ENCOUNTER — Encounter: Payer: Self-pay | Admitting: Orthopedic Surgery

## 2019-11-15 ENCOUNTER — Other Ambulatory Visit: Payer: Self-pay

## 2019-11-15 ENCOUNTER — Ambulatory Visit (INDEPENDENT_AMBULATORY_CARE_PROVIDER_SITE_OTHER): Payer: Medicare HMO | Admitting: Orthopedic Surgery

## 2019-11-15 VITALS — Ht 72.0 in | Wt 241.0 lb

## 2019-11-15 DIAGNOSIS — M25562 Pain in left knee: Secondary | ICD-10-CM

## 2019-11-15 DIAGNOSIS — G8929 Other chronic pain: Secondary | ICD-10-CM

## 2019-11-15 MED ORDER — METHYLPREDNISOLONE ACETATE 40 MG/ML IJ SUSP
40.0000 mg | INTRAMUSCULAR | Status: AC | PRN
  Administered 2019-11-15: 12:00:00 40 mg via INTRA_ARTICULAR

## 2019-11-15 MED ORDER — LIDOCAINE HCL (PF) 1 % IJ SOLN
5.0000 mL | INTRAMUSCULAR | Status: AC | PRN
  Administered 2019-11-15: 12:00:00 5 mL

## 2019-11-15 NOTE — Progress Notes (Signed)
Office Visit Note   Patient: Harold Bailey           Date of Birth: March 08, 1966           MRN: [id number] Visit Date: 11/15/2019              Requested by: Everardo Beals, NP [address] [place],  [place] [address] PCP: Everardo Beals, NP  Chief Complaint  Patient presents with  . Left Knee - Pain      HPI: Patient is a 54 year old gentleman who presents with acute mechanical symptoms of the patellofemoral joint left knee.  Patient states that while at work he had a locking episode in the patellofemoral joint he states he could not move his knee at all he states that this has resolved uneventfully each time that it has locked up on him.  Assessment & Plan: Visit Diagnoses:  1. Chronic pain of left knee     Plan: The left knee was injected he tolerated this well patient was given instructions for close chain kinetic exercises as well as isometric straight leg raises to strengthen his VMO.  Follow-Up Instructions: Return if symptoms worsen or fail to improve.   Ortho Exam  Patient is alert, oriented, no adenopathy, well-dressed, normal affect, normal respiratory effort. Examination patient has no effusion collaterals are cruciates are stable there is crepitation of the patellofemoral joint with range of motion.  Palpation around the patella reproduces patient's pain.  The medial and lateral joint line are nontender to palpation.  Patient has quad atrophy on the left compared to the right  Imaging: XR Knee 1-2 Views Left  Result Date: 11/15/2019 2 view radiographs of the left knee shows congruent joint space with mild spurring in the patellofemoral joint.  No images are attached to the encounter.  Labs: Lab Results  Component Value Date   HGBA1C 6.3 (H) 06/10/2017   HGBA1C 6.3 (H) 05/28/2017   HGBA1C 5.9 (H) 11/22/2013   REPTSTATUS 05/21/2017 FINAL 05/16/2017   REPTSTATUS 05/21/2017 FINAL 05/16/2017   GRAMSTAIN  05/11/2017    ABUNDANT WBC PRESENT,  PREDOMINANTLY PMN FEW GRAM NEGATIVE RODS    CULT NO GROWTH 5 DAYS 05/16/2017   CULT NO GROWTH 5 DAYS 05/16/2017   LABORGA METHICILLIN RESISTANT STAPHYLOCOCCUS AUREUS 05/11/2017     Lab Results  Component Value Date   ALBUMIN 4.4 05/08/2018   ALBUMIN 2.3 (L) 05/28/2017   ALBUMIN 2.8 (L) 05/12/2017    Lab Results  Component Value Date   MG 2.0 05/28/2017   MG 2.3 05/23/2017   MG 2.2 05/21/2017   No results found for: VD25OH  No results found for: PREALBUMIN CBC EXTENDED Latest Ref Rng & Units 07/29/2019 05/08/2018 06/08/2017  WBC 4.0 - 10.5 K/uL 8.8 7.7 8.2  RBC 4.22 - 5.81 Mil/uL 6.12(H) 5.78 4.78  HGB 13.0 - 17.0 g/dL 20.1 Repeated and verified X2.([place]) 17.8(H) 15.4  HCT 39.0 - 52.0 % 60.5 Repeated and verified X2.([place]) 53.6(H) 48.4  PLT 150.0 - 400.0 K/uL 157.0 162.0 193  NEUTROABS 1.7 - 7.7 K/uL - - -  LYMPHSABS 0.7 - 4.0 K/uL - - -     Body mass index is 32.69 kg/m.  Orders:  Orders Placed This Encounter  Procedures  . XR Knee 1-2 Views Left   No orders of the defined types were placed in this encounter.    Procedures: Large Joint Inj: L knee on 11/15/2019 11:54 AM Indications: pain and diagnostic evaluation Details: 22 G 1.5 in needle,  anteromedial approach  Arthrogram: No  Medications: 5 mL lidocaine (PF) 1 %; 40 mg methylPREDNISolone acetate 40 MG/ML Outcome: tolerated well, no immediate complications Procedure, treatment alternatives, risks and benefits explained, specific risks discussed. Consent was given by the patient. Immediately prior to procedure a time out was called to verify the correct patient, procedure, equipment, support staff and site/side marked as required. Patient was prepped and draped in the usual sterile fashion.      Clinical Data: No additional findings.  ROS:  All other systems negative, except as noted in the HPI. Review of Systems  Objective: Vital Signs: Ht 6' (1.829 m)   Wt 241 lb (109.3 kg)   BMI 32.69 kg/m    Specialty Comments:  No specialty comments available.  PMFS History: Patient Active Problem List   Diagnosis Date Noted  . Positive colorectal cancer screening using Cologuard test   . Otitis media, left 10/15/2018  . On home oxygen therapy 08/26/2018  . Difficult intravenous access   . Chronic respiratory failure with hypoxia ([place])   . Essential hypertension 05/11/2017  . Plantar fasciitis of left foot 01/14/2017  . Traumatic arthritis of ankle, right 01/07/2017  . H/O ankle fusion 11/14/2016  . 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 At risk for adverse drug event 10/08/2016  . History of snoring 10/08/2016  . Subtalar joint instability, left   . 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 History of alcohol abuse 07/02/2016  . COPD exacerbation ([place]) 07/02/2016  . Tobacco dependence 06/06/2015  . Medication overuse headache 02/28/2015  . Localization-related symptomatic epilepsy and epileptic syndromes with complex partial seizures, not intractable, without status epilepticus ([place]) 02/28/2015  . Cognitive impairment 02/28/2015  . Tobacco abuse 02/28/2015  . Brain tumor ([place]) 11/19/2013  . Seizures ([place]) 11/19/2013  . Memory loss   . HA (headache)   . Gait disturbance   . Falls frequently 10/15/2013  . Swelling 09/09/2013  . Rash and nonspecific skin eruption 09/09/2013  . Ankle fracture, lateral malleolus, closed 09/03/2013  . Unspecified vitamin D deficiency 08/12/2013  . Polycythemia   . Memory deficit   . History of drug abuse ([place])   . GERD (gastroesophageal reflux disease)   . S/P tricuspid valve repair   . Arthritis   . Hyperlipidemia   . Prediabetes    Past Medical History:  Diagnosis Date  . Anxiety   . Arthritis    Left ankle, hand  . COPD (chronic obstructive pulmonary disease) ([place])   . COPD (chronic obstructive pulmonary disease) ([place])   . Drug abuse ([place])   . DVT (deep venous thrombosis) ([facility])    Bilateral legs  . Gait  disturbance   . GERD (gastroesophageal reflux disease)   . 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  Marland Kitchen History of ankle surgery   . History of brain tumor   . Hyperlipidemia   . Hypertension   . 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 Memory loss   . On home oxygen therapy 08/26/2018   at night while lying down  . Peripheral vision loss    Left due to brain surgery  . Pneumonia   . Polycythemia   . Pre-diabetes   . Seizures ([place]) 11/19/2013   none since 2016  . Shortness of breath    Hx of smoking.  Marland Kitchen SVT (supraventricular tachycardia) ([place])   . Wears dentures   .  Wears glasses     Family History  Problem Relation Age of Onset  . Hypertension Mother   . Bronchitis Mother   . Cancer Maternal Aunt   . Cancer Maternal Grandmother   . Healthy Sister   . Healthy Brother   . Stroke Neg Hx   . Diabetes Neg Hx     Past Surgical History:  Procedure Laterality Date  . ABDOMINAL AORTOGRAM W/LOWER EXTREMITY N/A 05/30/2017   Procedure: ABDOMINAL AORTOGRAM W/LOWER EXTREMITY;  Surgeon: Elam Dutch, MD;  Location: [facility];  Service: Cardiovascular;  Laterality: N/A;  . ANKLE ARTHROSCOPY  01/17/2012   Procedure: ANKLE ARTHROSCOPY;  Surgeon: [name], MD;  Location: [facility];  Service: Orthopedics;  Laterality: Left;  . ANKLE ARTHROSCOPY WITH FUSION Left 10/02/2016   Procedure: Left Posterior Subtalar Arthrodesis Arthroscopy;  Surgeon: [name], MD;  Location: [facility];  Service: Orthopedics;  Laterality: Left;  . ANKLE FUSION Left 11/2012   Dr Sharol Given  . ANKLE SURGERY  2003   Left  . BRAIN SURGERY  2009   Biopsy  . COLONOSCOPY WITH PROPOFOL N/A 03/11/2019   Procedure: COLONOSCOPY WITH PROPOFOL;  Surgeon: Mauri Pole, MD;  Location: [facility];  Service: Endoscopy;  Laterality: N/A;  . FRACTURE SURGERY  2003   Left ankle  . HARDWARE REMOVAL Left 12/08/2012   Procedure: HARDWARE REMOVAL;   Surgeon: [name], MD;  Location: [facility];  Service: Orthopedics;  Laterality: Left;  Removal Deep Hardware, Take Down Non-Union, Revision Internal Fixation Left Ankle  . I & D EXTREMITY Left 01/01/2013   Procedure: IRRIGATION AND DEBRIDEMENT EXTREMITY;  Surgeon: [name], MD;  Location: [facility];  Service: Orthopedics;  Laterality: Left;  Irrigation, Debridement and Placement Antibiotic Beads Left Ankle  . INNER EAR SURGERY    . OPEN REDUCTION INTERNAL FIXATION (ORIF) SCAPHOID WITH DISTAL RADIUS GRAFT Right 02/01/2013   Procedure: OPEN REDUCTION INTERNAL FIXATION (ORIF) RIGHT SCAPHOID FRACTURE WITH DISTAL RADIUS GRAFT;  Surgeon: Schuyler Amor, MD;  Location: [facility];  Service: Orthopedics;  Laterality: Right;  . ORIF ANKLE FRACTURE Left 12/08/2012   Procedure: OPEN REDUCTION INTERNAL FIXATION (ORIF) ANKLE FRACTURE;  Surgeon: [name], MD;  Location: [facility];  Service: Orthopedics;  Laterality: Left;  Removal Deep Hardware, Take Down Non-Union, Revision Internal Fixation Left Ankle  . ORIF ANKLE FRACTURE Right 09/03/2013   Procedure: OPEN REDUCTION INTERNAL FIXATION (ORIF) ANKLE FRACTURE;  Surgeon: [name], MD;  Location: [facility];  Service: Orthopedics;  Laterality: Right;  Open Reduction Internal Fixation Right Fibula  . POLYPECTOMY  03/11/2019   Procedure: POLYPECTOMY;  Surgeon: Mauri Pole, MD;  Location: [facility];  Service: Endoscopy;;  . SCAPHOID FRACTURE SURGERY  02/04/2013  . SHOULDER ARTHROSCOPY  2008   Left  . TYMPANOSTOMY TUBE PLACEMENT  1993   Social History   Occupational History    Employer: NOT EMPLOYED    Comment: Disabled  Tobacco Use  . Smoking status: Current Every Day Smoker    Packs/day: 1.00    Years: 40.00    Pack years: 40.00    Types: Cigarettes  . Smokeless tobacco: Never Used  Substance and Sexual Activity  . Alcohol use: No    Alcohol/week: 0.0 standard drinks    Comment: quit Oct. 2017(heavy drinker) .In [place]  . Drug use: No  . Sexual  activity: Not Currently

## 2019-12-07 ENCOUNTER — Other Ambulatory Visit (INDEPENDENT_AMBULATORY_CARE_PROVIDER_SITE_OTHER): Payer: Self-pay | Admitting: Orthopedic Surgery

## 2019-12-16 ENCOUNTER — Other Ambulatory Visit: Payer: Self-pay | Admitting: Neurology

## 2019-12-25 ENCOUNTER — Telehealth: Payer: Self-pay | Admitting: Neurology

## 2019-12-25 NOTE — Telephone Encounter (Signed)
Spoke to on-call nurse re: patient calling about seizure activity. He has been seizure-free for 3 yrs then yesterday started feeling dizzy off and on, then had a 30-40 min episode where he could not get words out (he says this is his typical seizure). EMS came and vitals were normal, stayed home. Today he still has some off and on dizziness, nurse instructed to go to ER but patient refused. Denied missing medication doses. Meds reviewed, on Depakote ER 1000mg  daily and oxcarb 300mg  BID. Instructed to increase oxcarb to 600mg  BID and call office on Monday for update.

## 2019-12-27 ENCOUNTER — Telehealth: Payer: Self-pay | Admitting: Neurology

## 2019-12-27 NOTE — Telephone Encounter (Signed)
Noted  

## 2019-12-27 NOTE — Telephone Encounter (Signed)
Patient called to report he had seizure over the holiday weekend on 12/23/19. He said he called the doctor on call and was told to double up on oxcarbazepine 300 MG, and he did.   No new updates to report. He just wanted Dr. Tomi Likens to be aware he had a seizure after 3 years of not having one.

## 2019-12-28 NOTE — Telephone Encounter (Signed)
Decrease the oxcarbazepine from 600mg  twice daily down to 450mg  twice daily.

## 2019-12-28 NOTE — Telephone Encounter (Signed)
Patient called and left a message requesting a call back from a nurse. He said, "I doubled up on the medication but now my mouth is no dry I can hardly swallow."

## 2019-12-28 NOTE — Telephone Encounter (Signed)
Left message advising patient to decrease oxcarbazepine from 600mg  bid to 450mg  bid.

## 2020-01-01 ENCOUNTER — Other Ambulatory Visit: Payer: Self-pay | Admitting: Neurology

## 2020-01-11 ENCOUNTER — Other Ambulatory Visit (INDEPENDENT_AMBULATORY_CARE_PROVIDER_SITE_OTHER): Payer: Self-pay | Admitting: Orthopedic Surgery

## 2020-01-18 ENCOUNTER — Telehealth: Payer: Self-pay | Admitting: Neurology

## 2020-01-18 NOTE — Telephone Encounter (Signed)
Spoke to pt,Advised pt of Dr. Tomi Likens note 12/28/2019. To decrease from 600mg  BID to 450mg  BID.   Pt verbalized understanding

## 2020-01-18 NOTE — Telephone Encounter (Signed)
Patient called and left a message requesting a call back from a nurse about his medication dosage. He said he has gotten conflicting messages about how much oxcarbazepine he should be taking.
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
## 2020-01-27 ENCOUNTER — Telehealth (INDEPENDENT_AMBULATORY_CARE_PROVIDER_SITE_OTHER): Payer: Medicare HMO | Admitting: Neurology

## 2020-01-27 ENCOUNTER — Other Ambulatory Visit: Payer: Self-pay

## 2020-01-27 ENCOUNTER — Encounter: Payer: Self-pay | Admitting: Neurology

## 2020-01-27 VITALS — Ht 72.0 in | Wt 218.0 lb

## 2020-01-27 DIAGNOSIS — G0490 Encephalitis and encephalomyelitis, unspecified: Secondary | ICD-10-CM

## 2020-01-27 DIAGNOSIS — G4440 Drug-induced headache, not elsewhere classified, not intractable: Secondary | ICD-10-CM | POA: Diagnosis not present

## 2020-01-27 DIAGNOSIS — M792 Neuralgia and neuritis, unspecified: Secondary | ICD-10-CM | POA: Diagnosis not present

## 2020-01-27 DIAGNOSIS — G40209 Localization-related (focal) (partial) symptomatic epilepsy and epileptic syndromes with complex partial seizures, not intractable, without status epilepticus: Secondary | ICD-10-CM | POA: Diagnosis not present

## 2020-01-27 MED ORDER — OXCARBAZEPINE 300 MG PO TABS: 450.0000 mg | ORAL_TABLET | Freq: Two times a day (BID) | ORAL | 5 refills | Status: DC

## 2020-01-27 MED ORDER — AMITRIPTYLINE HCL 50 MG PO TABS: 50.0000 mg | ORAL_TABLET | Freq: Every day | ORAL | 5 refills | Status: DC

## 2020-02-01 ENCOUNTER — Other Ambulatory Visit: Payer: Self-pay

## 2020-02-01 ENCOUNTER — Other Ambulatory Visit (HOSPITAL_COMMUNITY)
Admission: RE | Admit: 2020-02-01 | Discharge: 2020-02-01 | Disposition: A | Discharge status: Home / Self Care | Payer: Medicare HMO | Source: Ambulatory Visit | Attending: Internal Medicine | Admitting: Internal Medicine

## 2020-02-01 ENCOUNTER — Ambulatory Visit (HOSPITAL_COMMUNITY)
Admission: RE | Admit: 2020-02-01 | Discharge: 2020-02-01 | Disposition: A | Discharge status: Home / Self Care | Payer: Medicare HMO | Source: Ambulatory Visit | Attending: Family Medicine | Admitting: Family Medicine

## 2020-02-01 ENCOUNTER — Other Ambulatory Visit (HOSPITAL_COMMUNITY): Payer: Self-pay | Admitting: Family Medicine

## 2020-02-01 DIAGNOSIS — D649 Anemia, unspecified: Secondary | ICD-10-CM | POA: Insufficient documentation

## 2020-02-01 DIAGNOSIS — Z981 Arthrodesis status: Secondary | ICD-10-CM

## 2020-02-01 DIAGNOSIS — E119 Type 2 diabetes mellitus without complications: Secondary | ICD-10-CM | POA: Diagnosis not present

## 2020-02-01 DIAGNOSIS — E538 Deficiency of other specified B group vitamins: Secondary | ICD-10-CM | POA: Diagnosis not present

## 2020-02-01 DIAGNOSIS — R946 Abnormal results of thyroid function studies: Secondary | ICD-10-CM | POA: Diagnosis not present

## 2020-02-01 DIAGNOSIS — R972 Elevated prostate specific antigen [PSA]: Secondary | ICD-10-CM | POA: Diagnosis not present

## 2020-02-01 DIAGNOSIS — E785 Hyperlipidemia, unspecified: Secondary | ICD-10-CM | POA: Insufficient documentation

## 2020-02-01 LAB — COMPREHENSIVE METABOLIC PANEL
ALT: 22 U/L (ref 0–44)
AST: 22 U/L (ref 15–41)
Albumin: 3.8 g/dL (ref 3.5–5.0)
Alkaline Phosphatase: 76 U/L (ref 38–126)
Anion gap: 12 (ref 5–15)
BUN: 10 mg/dL (ref 6–20)
CO2: 27 mmol/L (ref 22–32)
Calcium: 8.6 mg/dL — ABNORMAL LOW (ref 8.9–10.3)
Chloride: 103 mmol/L (ref 98–111)
Creatinine, Ser: 0.67 mg/dL (ref 0.61–1.24)
GFR calc Af Amer: >60 mL/min (ref >60)
GFR calc non Af Amer: >60 mL/min (ref >60)
Glucose, Bld: 166 mg/dL — ABNORMAL HIGH (ref 70–99)
Potassium: 4 mmol/L (ref 3.5–5.1)
Sodium: 142 mmol/L (ref 135–145)
Total Bilirubin: 0.6 mg/dL (ref 0.3–1.2)
Total Protein: 6.6 g/dL (ref 6.5–8.1)

## 2020-02-01 LAB — HEMOGLOBIN A1C
Hgb A1c MFr Bld: 6.3 % — ABNORMAL HIGH (ref 4.8–5.6)
Mean Plasma Glucose: 134.11 mg/dL

## 2020-02-01 LAB — CBC
HCT: 59.8 % — ABNORMAL HIGH (ref 39.0–52.0)
Hemoglobin: 19.6 g/dL — ABNORMAL HIGH (ref 13.0–17.0)
MCH: 34.3 pg — ABNORMAL HIGH (ref 26.0–34.0)
MCHC: 32.8 g/dL (ref 30.0–36.0)
MCV: 104.5 fL — ABNORMAL HIGH (ref 80.0–100.0)
Platelets: 113 10*3/uL — ABNORMAL LOW (ref 150–400)
RBC: 5.72 MIL/uL (ref 4.22–5.81)
RDW: 12.7 % (ref 11.5–15.5)
WBC: 5.1 10*3/uL (ref 4.0–10.5)
nRBC: 0 % (ref 0.0–0.2)

## 2020-02-01 LAB — TSH: TSH: 1.43 u[IU]/mL (ref 0.350–4.500)

## 2020-02-01 LAB — LIPID PANEL
Cholesterol: 134 mg/dL (ref 0–200)
HDL: 37 mg/dL — ABNORMAL LOW (ref >40)
LDL Cholesterol: 37 mg/dL (ref 0–99)
Total CHOL/HDL Ratio: 3.6 RATIO
Triglycerides: 300 mg/dL — ABNORMAL HIGH (ref <150)
VLDL: 60 mg/dL — ABNORMAL HIGH (ref 0–40)

## 2020-02-01 LAB — T4, FREE: Free T4: 0.47 ng/dL — ABNORMAL LOW (ref 0.61–1.12)

## 2020-02-02 LAB — PSA, TOTAL AND FREE
PSA, Free Pct: 32.2 %
PSA, Free: 0.29 ng/mL
Prostate Specific Ag, Serum: 0.9 ng/mL (ref 0.0–4.0)

## 2020-02-02 LAB — VITAMIN D 25 HYDROXY (VIT D DEFICIENCY, FRACTURES): Vit D, 25-Hydroxy: 37.9 ng/mL (ref 30–100)

## 2020-02-21 ENCOUNTER — Encounter (HOSPITAL_COMMUNITY): Payer: Self-pay | Admitting: *Deleted

## 2020-02-21 ENCOUNTER — Other Ambulatory Visit: Payer: Self-pay

## 2020-02-21 ENCOUNTER — Emergency Department (HOSPITAL_COMMUNITY): Payer: Medicare HMO

## 2020-02-21 ENCOUNTER — Emergency Department (HOSPITAL_COMMUNITY)
Admission: EM | Admit: 2020-02-21 | Discharge: 2020-02-21 | Disposition: A | Discharge status: Home / Self Care | Payer: Medicare HMO | Attending: Emergency Medicine | Admitting: Emergency Medicine

## 2020-02-21 DIAGNOSIS — F17210 Nicotine dependence, cigarettes, uncomplicated: Secondary | ICD-10-CM | POA: Insufficient documentation

## 2020-02-21 DIAGNOSIS — J449 Chronic obstructive pulmonary disease, unspecified: Secondary | ICD-10-CM | POA: Diagnosis not present

## 2020-02-21 DIAGNOSIS — N50812 Left testicular pain: Secondary | ICD-10-CM | POA: Diagnosis not present

## 2020-02-21 DIAGNOSIS — Z79899 Other long term (current) drug therapy: Secondary | ICD-10-CM | POA: Diagnosis not present

## 2020-02-21 LAB — URINALYSIS, ROUTINE W REFLEX MICROSCOPIC
Bilirubin Urine: NEGATIVE
Glucose, UA: NEGATIVE mg/dL
Hgb urine dipstick: NEGATIVE
Ketones, ur: NEGATIVE mg/dL
Leukocytes,Ua: NEGATIVE
Nitrite: NEGATIVE
Protein, ur: NEGATIVE mg/dL
Specific Gravity, Urine: 1.02 (ref 1.005–1.030)
pH: 5 (ref 5.0–8.0)

## 2020-02-21 MED ORDER — KETOROLAC TROMETHAMINE 60 MG/2ML IM SOLN
60.0000 mg | Freq: Once | INTRAMUSCULAR | Status: AC
  Administered 2020-02-21: 60 mg via INTRAMUSCULAR
  Filled 2020-02-21: qty 2

## 2020-02-21 NOTE — ED Provider Notes (Signed)
[facility] EMERGENCY DEPARTMENT Provider Note   CSN: [id number] Arrival date & time: 02/21/20  [id number]     History Chief Complaint  Patient presents with  . Testicle Pain    Harold Bailey is a 54 y.o. male.  HPI    54 year old male, presents with a complaint of left-sided scrotal discomfort and a testicle that seems to disappear in the mornings.  He has been having increasing pain that last throughout the day, seems to get worse, this morning it was much worse and he felt like his left testicle had ascended and was not able to be palpated  There is no nausea vomiting fevers and no difficulty urinating.  He has not been seen by a urologist.  His family doctor gave him a cream to rub in his groin for 5 days but he states this has not helped  Past Medical History:  Diagnosis Date  . Anxiety   . Arthritis    Left ankle, hand  . COPD (chronic obstructive pulmonary disease) ([place])   . COPD (chronic obstructive pulmonary disease) ([place])   . Drug abuse ([place])   . DVT (deep venous thrombosis) (H[facility])    Bilateral legs  . Gait disturbance   . GERD (gastroesophageal reflux disease)   . 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  Marland Kitchen History of ankle surgery   . History of brain tumor   . Hyperlipidemia   . Hypertension   . 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 Memory loss   . On home oxygen therapy 08/26/2018   at night while lying down  . Peripheral vision loss    Left due to brain surgery  . Pneumonia   . Polycythemia   . Pre-diabetes   . Seizures ([place]) 11/19/2013   none since 2016  . Shortness of breath    Hx of smoking.  Marland Kitchen SVT (supraventricular tachycardia) ([place])   . Wears dentures   . Wears glasses     Patient Active Problem List   Diagnosis Date Noted  . Positive colorectal cancer screening using Cologuard test   . Otitis media, left 10/15/2018  . On home oxygen therapy 08/26/2018  . Difficult  intravenous access   . Chronic respiratory failure with hypoxia ([place])   . Essential hypertension 05/11/2017  . Plantar fasciitis of left foot 01/14/2017  . Traumatic arthritis of ankle, right 01/07/2017  . H/O ankle fusion 11/14/2016  . 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 At risk for adverse drug event 10/08/2016  . History of snoring 10/08/2016  . Subtalar joint instability, left   . 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 History of alcohol abuse 07/02/2016  . COPD exacerbation ([place]) 07/02/2016  . Tobacco dependence 06/06/2015  . Medication overuse headache 02/28/2015  . Localization-related symptomatic epilepsy and epileptic syndromes with complex partial seizures, not intractable, without status epilepticus ([place]) 02/28/2015  . Cognitive impairment 02/28/2015  . Tobacco abuse 02/28/2015  . Brain tumor ([place]) 11/19/2013  . Seizures ([place]) 11/19/2013  . Memory loss   . HA (headache)   . Gait disturbance   . Falls frequently 10/15/2013  . Swelling 09/09/2013  . Rash and nonspecific skin eruption 09/09/2013  . Ankle fracture, lateral malleolus, closed 09/03/2013  . Unspecified vitamin D deficiency 08/12/2013  . Polycythemia   . Memory deficit   . History of drug abuse ([place])   . GERD (gastroesophageal reflux  disease)   . S/P tricuspid valve repair   . Arthritis   . Hyperlipidemia   . Prediabetes     Past Surgical History:  Procedure Laterality Date  . ABDOMINAL AORTOGRAM W/LOWER EXTREMITY N/A 05/30/2017   Procedure: ABDOMINAL AORTOGRAM W/LOWER EXTREMITY;  Surgeon: Elam Dutch, MD;  Location: [place] [facility];  Service: Cardiovascular;  Laterality: N/A;  . ANKLE ARTHROSCOPY  01/17/2012   Procedure: ANKLE ARTHROSCOPY;  Surgeon: [name], MD;  Location: [place];  Service: Orthopedics;  Laterality: Left;  . ANKLE ARTHROSCOPY WITH FUSION Left 10/02/2016   Procedure: Left Posterior Subtalar Arthrodesis Arthroscopy;  Surgeon: [name], MD;   Location: [place];  Service: Orthopedics;  Laterality: Left;  . ANKLE FUSION Left 11/2012   Dr Sharol Given  . ANKLE SURGERY  2003   Left  . BRAIN SURGERY  2009   Biopsy  . COLONOSCOPY WITH PROPOFOL N/A 03/11/2019   Procedure: COLONOSCOPY WITH PROPOFOL;  Surgeon: Mauri Pole, MD;  Location: [facility];  Service: Endoscopy;  Laterality: N/A;  . FRACTURE SURGERY  2003   Left ankle  . HARDWARE REMOVAL Left 12/08/2012   Procedure: HARDWARE REMOVAL;  Surgeon: [name], MD;  Location: [place];  Service: Orthopedics;  Laterality: Left;  Removal Deep Hardware, Take Down Non-Union, Revision Internal Fixation Left Ankle  . I & D EXTREMITY Left 01/01/2013   Procedure: IRRIGATION AND DEBRIDEMENT EXTREMITY;  Surgeon: [name], MD;  Location: [place];  Service: Orthopedics;  Laterality: Left;  Irrigation, Debridement and Placement Antibiotic Beads Left Ankle  . INNER EAR SURGERY    . OPEN REDUCTION INTERNAL FIXATION (ORIF) SCAPHOID WITH DISTAL RADIUS GRAFT Right 02/01/2013   Procedure: OPEN REDUCTION INTERNAL FIXATION (ORIF) RIGHT SCAPHOID FRACTURE WITH DISTAL RADIUS GRAFT;  Surgeon: Schuyler Amor, MD;  Location: [place];  Service: Orthopedics;  Laterality: Right;  . ORIF ANKLE FRACTURE Left 12/08/2012   Procedure: OPEN REDUCTION INTERNAL FIXATION (ORIF) ANKLE FRACTURE;  Surgeon: [name], MD;  Location: [place];  Service: Orthopedics;  Laterality: Left;  Removal Deep Hardware, Take Down Non-Union, Revision Internal Fixation Left Ankle  . ORIF ANKLE FRACTURE Right 09/03/2013   Procedure: OPEN REDUCTION INTERNAL FIXATION (ORIF) ANKLE FRACTURE;  Surgeon: [name], MD;  Location: [place];  Service: Orthopedics;  Laterality: Right;  Open Reduction Internal Fixation Right Fibula  . POLYPECTOMY  03/11/2019   Procedure: POLYPECTOMY;  Surgeon: Mauri Pole, MD;  Location: [facility];  Service: Endoscopy;;  . SCAPHOID FRACTURE SURGERY  02/04/2013  . SHOULDER ARTHROSCOPY  2008   Left  .  TYMPANOSTOMY TUBE PLACEMENT  1993       Family History  Problem Relation Age of Onset  . Hypertension Mother   . Bronchitis Mother   . Cancer Maternal Aunt   . Cancer Maternal Grandmother   . Healthy Sister   . Healthy Brother   . Stroke Neg Hx   . Diabetes Neg Hx     Social History   Tobacco Use  . Smoking status: Current Every Day Smoker    Packs/day: 1.00    Years: 40.00    Pack years: 40.00    Types: Cigarettes  . Smokeless tobacco: Never Used  Substance Use Topics  . Alcohol use: No    Alcohol/week: 0.0 standard drinks    Comment: quit Oct. 2017(heavy drinker) .In [place]  . Drug use: No    Home Medications Prior to Admission medications   Medication Sig Start  Date End Date Taking? Authorizing Provider  albuterol (PROVENTIL HFA;VENTOLIN HFA) 108 (90 Base) MCG/ACT inhaler Inhale 2 puffs into the lungs every 6 (six) hours as needed for wheezing or shortness of breath. 12/01/18  Yes Rigoberto Noel, MD  Alfalfa 500 MG TABS Take 1,000 mg by mouth 2 (two) times a day.   Yes [provider]  allopurinol (ZYLOPRIM) 300 MG tablet Take 300 mg by mouth daily. 02/10/20  Yes [provider]  ALPRAZolam Duanne Moron) 1 MG tablet Take 1 mg by mouth 3 (three) times daily as needed for anxiety.  01/04/19  Yes [provider]  ascorbic acid (VITAMIN C) 1000 MG tablet Take 1,000 mg by mouth daily.   Yes [provider]  Aspirin-Salicylamide-Caffeine (BC HEADACHE POWDER PO) Take 1 packet by mouth daily as needed (pain).   Yes [provider]  BREO ELLIPTA 200-25 MCG/INH AEPB Inhale 1 puff into the lungs daily. 01/26/19  Yes [provider]  Chlorphen-Phenyleph-ASA (ALKA-SELTZER PLUS COLD PO) Take 1 tablet by mouth daily as needed (for cold).   Yes [provider]  divalproex (DEPAKOTE ER) 500 MG 24 hr tablet TAKE 2 TABLETS(1000 MG) BY MOUTH DAILY Patient taking differently: Take 1,000 mg by mouth daily.  01/03/20  Yes Jaffe, Adam R, DO    gabapentin (NEURONTIN) 300 MG capsule TAKE 1 CAPSULE(300 MG) BY MOUTH TWICE DAILY Patient taking differently: Take 300 mg by mouth 2 (two) times daily.  12/16/19  Yes Jaffe, Adam R, DO  ibuprofen (ADVIL) 800 MG tablet Take 800 mg by mouth every 8 (eight) hours as needed for mild pain or moderate pain. Do not take with meloxicam   Yes [provider]  ketoconazole (NIZORAL) 2 % cream Apply 1 application topically daily.  02/10/20  Yes [provider]  LECITHIN PO Take 1 capsule by mouth daily.   Yes [provider]  meloxicam (MOBIC) 7.5 MG tablet TAKE 1 TABLET(7.5 MG) BY MOUTH DAILY Patient taking differently: Take 7.5 mg by mouth daily.  01/12/20  Yes [name], MD  metFORMIN (GLUCOPHAGE) 500 MG tablet Take 500 mg by mouth 2 (two) times daily. 02/10/20  Yes [provider]  metoprolol succinate (TOPROL-XL) 50 MG 24 hr tablet Take 50 mg by mouth daily. 02/01/20  Yes [provider]  Multiple Vitamins-Minerals (MULTIVITAMIN WITH MINERALS) tablet Take 2 tablets by mouth daily.    Yes [provider]  Naphazoline-Pheniramine (OPCON-A) 0.027-0.315 % SOLN Place 1 drop into both eyes every 8 (eight) hours as needed (red, irritated eyes).   Yes [provider]  Omega-3 1000 MG CAPS Take 1 capsule by mouth daily.   Yes [provider]  Oxcarbazepine (TRILEPTAL) 300 MG tablet Take 1.5 tablets (450 mg total) by mouth 2 (two) times daily. Patient taking differently: Take 300 mg by mouth in the morning, at noon, and at bedtime.  01/27/20  Yes Jaffe, Adam R, DO  rosuvastatin (CRESTOR) 10 MG tablet Take 10 mg by mouth daily at 6 PM. 03/13/19  Yes [provider]  TRELEGY ELLIPTA 100-62.5-25 MCG/INH AEPB INHALE 1 PUFF INTO THE LUNGS DAILY Patient taking differently: Take 1 puff by mouth daily.  09/02/19  Yes Rigoberto Noel, MD  VOLTAREN 1 % GEL APPLY 2 TO 4 GRAMS EXTERNALLY TO THE AFFECTED AREA TWICE DAILY Patient taking differently:  Apply 4 g topically 4 (four) times daily.  09/02/18  Yes [name], MD  amitriptyline (ELAVIL) 50 MG tablet Take 1 tablet (50  mg total) by mouth at bedtime. Patient not taking: Reported on 02/21/2020 01/27/20   Pieter Partridge, DO  aspirin EC 81 MG tablet Take 81 mg by mouth daily.    [provider]  omeprazole (PRILOSEC) 40 MG capsule Take 40 mg by mouth daily as needed (acid reflux).  12/11/18   [provider]  OXYGEN Inhale into the lungs. At home oxygen    [provider]  predniSONE (DELTASONE) 50 MG tablet Take one tablet by mouth once daily for 5 days Patient not taking: Reported on 02/21/2020 03/30/19   Suzan Slick, NP    Allergies    Hydrocodone, Lyrica [pregabalin], and Other  Review of Systems   Review of Systems  Genitourinary: Positive for testicular pain. Negative for difficulty urinating.  Skin: Negative for rash.    Physical Exam Updated Vital Signs BP (!) 134/92 (BP Location: Left Arm)   Pulse (!) 103   Temp 97.8 F (36.6 C) (Oral)   Resp 16   Ht 1.829 m (6')   Wt 108 kg   SpO2 94%   BMI 32.28 kg/m   Physical Exam Vitals and nursing note reviewed.  Constitutional:      Appearance: He is well-developed. He is not diaphoretic.  HENT:     Head: Normocephalic and atraumatic.  Eyes:     General:        Right eye: No discharge.        Left eye: No discharge.     Conjunctiva/sclera: Conjunctivae normal.  Pulmonary:     Effort: Pulmonary effort is normal. No respiratory distress.  Genitourinary:    Comments: Normal-appearing penis, circumcised, no drainage discharge or bleeding from the urethral meatus.  Right hemiscrotum is normal with appearance of testicle, no tenderness, left hemiscrotum, testicle is difficult to find, it seems to be proximal, tender, no significant swelling or redness Skin:    General: Skin is warm and dry.     Findings: No erythema or rash.  Neurological:     Mental Status: He is alert.     Coordination:  Coordination normal.     ED Results / Procedures / Treatments   Labs (all labs ordered are listed, but only abnormal results are displayed) Labs Reviewed  URINALYSIS, ROUTINE W REFLEX MICROSCOPIC - Abnormal; Notable for the following components:      Result Value   Color, Urine AMBER (*)    All other components within normal limits  URINE CULTURE    EKG None  Radiology US SCROTUM W/DOPPLER  Result Date: 02/21/2020 CLINICAL DATA:  54 year old male with bilateral testicular pain for 3 weeks. EXAM: SCROTAL ULTRASOUND DOPPLER ULTRASOUND OF THE TESTICLES TECHNIQUE: Complete ultrasound examination of the testicles, epididymis, and other scrotal structures was performed. Color and spectral Doppler ultrasound were also utilized to evaluate blood flow to the testicles. COMPARISON:  None. FINDINGS: Right testicle Measurements: 5.5 x 2.5 x 3.2 cm. No mass or microlithiasis visualized. Left testicle Measurements: 5.3 x 2.5 x 3.4 cm. No mass or microlithiasis visualized. Right epididymis:  Normal in size and appearance. Left epididymis:  Normal in size and appearance. Hydrocele:  None visualized. Varicocele:  None visualized. Pulsed Doppler interrogation of both testes demonstrates normal low resistance arterial and venous waveforms bilaterally. IMPRESSION: Unremarkable testicular ultrasound. No evidence of testicular mass or torsion. Electronically Signed   By: Margarette Canada M.D.   On: 02/21/2020 11:04    Procedures Procedures (including critical care time)  Medications Ordered in ED Medications  ketorolac (TORADOL) injection 60 mg (has no administration in time range)    ED Course  I have reviewed the triage vital signs and the nursing notes.  Pertinent labs & imaging results that were available during my care of the patient were reviewed by me and considered in my medical decision making (see chart for details).    MDM Rules/Calculators/A&P                      We will perform ultrasound  of the scrotum in hopes that we can evaluate the testicle.  Would consider things such as hernia, epididymitis, less likely to be torsion or some other abnormality.  Patient agreeable to the plan  Ultra sound is negative, vital signs unremarkable  Patient updated, given anti-inflammatory, stable for discharge, can follow-up with family doctor and urologist.  No signs of hernia, no abdominal pain, no nausea and no tenderness or masses on exam  Final Clinical Impression(s) / ED Diagnoses Final diagnoses:  Testicular pain, left    Rx / DC Orders ED Discharge Orders    None       Noemi Chapel, MD 02/21/20 1108

## 2020-02-21 NOTE — ED Triage Notes (Signed)
Pt c/o testicle pain that has gotten progressively worse over the last few days; pt states he feels better when lying down flat

## 2020-02-21 NOTE — ED Notes (Signed)
Oxygen noted to be around 83% on RA. Nurse asked pt if he uses oxygen at home, Pt said yes 4 LPM at bedtime and when he remembers during the day for COPD. 4 LPM nasal cannula started.

## 2020-02-21 NOTE — Discharge Instructions (Signed)
Your ultrasound showed a normal testicle and scrotum.  You may follow-up with the urologist as needed for increasing pain, I would encourage that you call them in the morning for the next available appointment.  Please see the phone number above  Take ibuprofen as needed 3 times a day

## 2020-02-23 LAB — URINE CULTURE: Culture: NO GROWTH

## 2020-03-09 ENCOUNTER — Other Ambulatory Visit (HOSPITAL_COMMUNITY)
Admission: RE | Admit: 2020-03-09 | Discharge: 2020-03-09 | Disposition: A | Discharge status: Home / Self Care | Payer: Medicare HMO | Source: Ambulatory Visit | Attending: Internal Medicine | Admitting: Internal Medicine

## 2020-03-09 ENCOUNTER — Other Ambulatory Visit: Payer: Self-pay

## 2020-03-09 DIAGNOSIS — R5383 Other fatigue: Secondary | ICD-10-CM | POA: Diagnosis present

## 2020-03-10 LAB — TESTOSTERONE: Testosterone: 383 ng/dL (ref 264–916)

## 2020-03-14 LAB — TESTOSTERONE, FREE: Testosterone, Free: 6 pg/mL — ABNORMAL LOW (ref 7.2–24.0)

## 2020-04-05 ENCOUNTER — Other Ambulatory Visit (INDEPENDENT_AMBULATORY_CARE_PROVIDER_SITE_OTHER): Payer: Self-pay | Admitting: Orthopedic Surgery

## 2020-04-05 ENCOUNTER — Other Ambulatory Visit: Payer: Self-pay | Admitting: Neurology

## 2020-04-05 DIAGNOSIS — G40209 Localization-related (focal) (partial) symptomatic epilepsy and epileptic syndromes with complex partial seizures, not intractable, without status epilepticus: Secondary | ICD-10-CM

## 2020-04-05 DIAGNOSIS — G0490 Encephalitis and encephalomyelitis, unspecified: Secondary | ICD-10-CM

## 2020-04-05 DIAGNOSIS — R4189 Other symptoms and signs involving cognitive functions and awareness: Secondary | ICD-10-CM

## 2020-04-05 DIAGNOSIS — G4440 Drug-induced headache, not elsewhere classified, not intractable: Secondary | ICD-10-CM

## 2020-04-05 DIAGNOSIS — M792 Neuralgia and neuritis, unspecified: Secondary | ICD-10-CM

## 2020-04-05 DIAGNOSIS — Z79899 Other long term (current) drug therapy: Secondary | ICD-10-CM

## 2020-04-24 ENCOUNTER — Telehealth: Payer: Self-pay

## 2020-04-25 ENCOUNTER — Other Ambulatory Visit (HOSPITAL_COMMUNITY)
Admission: RE | Admit: 2020-04-25 | Discharge: 2020-04-25 | Disposition: A | Discharge status: Home / Self Care | Payer: Medicare HMO | Source: Ambulatory Visit | Attending: Internal Medicine | Admitting: Internal Medicine

## 2020-04-25 DIAGNOSIS — E1165 Type 2 diabetes mellitus with hyperglycemia: Secondary | ICD-10-CM | POA: Diagnosis present

## 2020-04-25 DIAGNOSIS — E559 Vitamin D deficiency, unspecified: Secondary | ICD-10-CM | POA: Diagnosis present

## 2020-04-25 DIAGNOSIS — N390 Urinary tract infection, site not specified: Secondary | ICD-10-CM | POA: Insufficient documentation

## 2020-04-25 DIAGNOSIS — E7849 Other hyperlipidemia: Secondary | ICD-10-CM | POA: Diagnosis present

## 2020-04-25 DIAGNOSIS — D513 Other dietary vitamin B12 deficiency anemia: Secondary | ICD-10-CM | POA: Diagnosis present

## 2020-04-25 DIAGNOSIS — D510 Vitamin B12 deficiency anemia due to intrinsic factor deficiency: Secondary | ICD-10-CM | POA: Diagnosis present

## 2020-04-25 LAB — LIPID PANEL
Cholesterol: 153 mg/dL (ref 0–200)
HDL: 33 mg/dL — ABNORMAL LOW (ref >40)
LDL Cholesterol: 47 mg/dL (ref 0–99)
Total CHOL/HDL Ratio: 4.6 RATIO
Triglycerides: 364 mg/dL — ABNORMAL HIGH (ref <150)
VLDL: 73 mg/dL — ABNORMAL HIGH (ref 0–40)

## 2020-04-25 LAB — HEMOGLOBIN A1C
Hgb A1c MFr Bld: 6 % — ABNORMAL HIGH (ref 4.8–5.6)
Mean Plasma Glucose: 125.5 mg/dL

## 2020-04-25 LAB — URIC ACID: Uric Acid, Serum: 5.2 mg/dL (ref 3.7–8.6)

## 2020-04-25 LAB — VITAMIN D 25 HYDROXY (VIT D DEFICIENCY, FRACTURES): Vit D, 25-Hydroxy: 34.03 ng/mL (ref 30–100)

## 2020-04-25 LAB — MAGNESIUM: Magnesium: 2 mg/dL (ref 1.7–2.4)

## 2020-04-25 LAB — FOLATE: Folate: 31.3 ng/mL (ref >5.9)

## 2020-04-25 LAB — VITAMIN B12: Vitamin B-12: 295 pg/mL (ref 180–914)

## 2020-06-20 ENCOUNTER — Ambulatory Visit: Payer: Self-pay

## 2020-06-20 ENCOUNTER — Encounter: Payer: Self-pay | Admitting: Orthopedic Surgery

## 2020-06-20 ENCOUNTER — Ambulatory Visit (INDEPENDENT_AMBULATORY_CARE_PROVIDER_SITE_OTHER): Payer: Medicare HMO | Admitting: Physician Assistant

## 2020-06-20 DIAGNOSIS — Z981 Arthrodesis status: Secondary | ICD-10-CM

## 2020-06-20 NOTE — Progress Notes (Signed)
Office Visit Note   Patient: Harold Bailey           Date of Birth: 08-15-1966           MRN: [id number] Visit Date: 06/20/2020              Requested by: Lucia Gaskins, MD [address] [place],  [place] [address] PCP: Lucia Gaskins, MD  Chief Complaint  Patient presents with  . Left Ankle - Pain  . Right Ankle - Pain      HPI: This is a 54 year old gentleman who is status post left ankle arthrodesis.  He also has a history of an open reduction internal fixation in 2014 of a right distal fibula fracture.  He is having induced increasing difficulties with this right ankle.  It makes it difficult for him to stand on his foot for any length of time.  It is bothering him so much that he feels he is over consuming alcohol at night for the pain.  Assessment & Plan: Visit Diagnoses:  1. H/O ankle fusion     Plan: Right ankle traumatic arthritis.  Patient will be seen by vein and vascular for repeat ABIs as his dorsalis pedis pulse was barely audible by Doppler.  Once the recommendations made we will go forward and go forward with a right ankle arthrodesis.  The risks and recovery were reviewed.  He would like to go to skilled nursing.  We also discussed smoking cessation which he is willing to do for the perioperative.  Follow-Up Instructions: No follow-ups on file.   Ortho Exam  Patient is alert, oriented, no adenopathy, well-dressed, normal affect, normal respiratory effort. Focused examination of his right ankle he is tender around the ankle joint.  Limited range of motion.  He has 5 phasic posterior tibial pulses.  Difficult to find pulses even with Doppler on the  dorsalis pedis.  Pulses were identified with Doppler but very diminished no cellulitis or signs of infection  Imaging: No results found. No images are attached to the encounter.  Labs: Lab Results  Component Value Date   HGBA1C 6.0 (H) 04/25/2020   HGBA1C 6.3 (H) 02/01/2020   HGBA1C 6.3 (H)  06/10/2017   LABURIC 5.2 04/25/2020   REPTSTATUS 02/23/2020 FINAL 02/21/2020   GRAMSTAIN  05/11/2017    ABUNDANT WBC PRESENT, PREDOMINANTLY PMN FEW GRAM NEGATIVE RODS    CULT  02/21/2020    NO GROWTH Performed at [facility], [place] [address] [place], [place] [address]    LABORGA METHICILLIN RESISTANT STAPHYLOCOCCUS AUREUS 05/11/2017     Lab Results  Component Value Date   ALBUMIN 3.8 02/01/2020   ALBUMIN 4.4 05/08/2018   ALBUMIN 2.3 (L) 05/28/2017   LABURIC 5.2 04/25/2020    Lab Results  Component Value Date   MG 2.0 04/25/2020   MG 2.0 05/28/2017   MG 2.3 05/23/2017   Lab Results  Component Value Date   VD25OH 34.03 04/25/2020   VD25OH 37.9 02/01/2020    No results found for: PREALBUMIN CBC EXTENDED Latest Ref Rng & Units 02/01/2020 07/29/2019 05/08/2018  WBC 4.0 - 10.5 K/uL 5.1 8.8 7.7  RBC 4.22 - 5.81 MIL/uL 5.72 6.12(H) 5.78  HGB 13.0 - 17.0 g/dL 19.6(H) 20.1 Repeated and verified X2.([place]) 17.8(H)  HCT 39 - 52 % 59.8(H) 60.5 Repeated and verified X2.([place]) 53.6(H)  PLT 150 - 400 K/uL 113(L) 157.0 162.0  NEUTROABS 1.7 - 7.7 K/uL - - -  LYMPHSABS 0.7 - 4.0  K/uL - - -     There is no height or weight on file to calculate BMI.  Orders:  Orders Placed This Encounter  Procedures  . XR Ankle Complete Right  . XR Ankle Complete Left  . Ambulatory referral to Vascular Surgery   No orders of the defined types were placed in this encounter.    Procedures: No procedures performed  Clinical Data: No additional findings.  ROS:  All other systems negative, except as noted in the HPI. Review of Systems  Objective: Vital Signs: There were no vitals taken for this visit.  Specialty Comments:  No specialty comments available.  PMFS History: Patient Active Problem List   Diagnosis Date Noted  . Positive colorectal cancer screening using Cologuard test   . Otitis media, left 10/15/2018  . On home oxygen therapy 08/26/2018  . Difficult intravenous  access   . Chronic respiratory failure with hypoxia ([place])   . Essential hypertension 05/11/2017  . Plantar fasciitis of left foot 01/14/2017  . Traumatic arthritis of ankle, right 01/07/2017  . H/O ankle fusion 11/14/2016  . 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 At risk for adverse drug event 10/08/2016  . History of snoring 10/08/2016  . Subtalar joint instability, left   . 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 History of alcohol abuse 07/02/2016  . COPD exacerbation ([place]) 07/02/2016  . Tobacco dependence 06/06/2015  . Medication overuse headache 02/28/2015  . Localization-related symptomatic epilepsy and epileptic syndromes with complex partial seizures, not intractable, without status epilepticus ([place]) 02/28/2015  . Cognitive impairment 02/28/2015  . Tobacco abuse 02/28/2015  . Brain tumor ([place]) 11/19/2013  . Seizures ([place]) 11/19/2013  . Memory loss   . HA (headache)   . Gait disturbance   . Falls frequently 10/15/2013  . Swelling 09/09/2013  . Rash and nonspecific skin eruption 09/09/2013  . Ankle fracture, lateral malleolus, closed 09/03/2013  . Unspecified vitamin D deficiency 08/12/2013  . Polycythemia   . Memory deficit   . History of drug abuse ([place])   . GERD (gastroesophageal reflux disease)   . S/P tricuspid valve repair   . Arthritis   . Hyperlipidemia   . Prediabetes    Past Medical History:  Diagnosis Date  . Anxiety   . Arthritis    Left ankle, hand  . COPD (chronic obstructive pulmonary disease) ([place])   . COPD (chronic obstructive pulmonary disease) ([place])   . Drug abuse ([place])   . DVT (deep venous thrombosis) ([facility])    Bilateral legs  . Gait disturbance   . GERD (gastroesophageal reflux disease)   . 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  Marland Kitchen History of ankle surgery   . History of brain tumor   . Hyperlipidemia   . Hypertension   . Memory deficit    from brain  surgery- benign tumor  . Memory loss   . On home oxygen therapy 08/26/2018   at night while lying down  . Peripheral vision loss    Left due to brain surgery  . Pneumonia   . Polycythemia   . Pre-diabetes   . Seizures ([place]) 11/19/2013   none since 2016  . Shortness of breath    Hx of smoking.  Marland Kitchen SVT (supraventricular tachycardia) ([place])   . Wears dentures   . Wears glasses     Family History  Problem Relation Age of Onset  . Hypertension Mother   .  Bronchitis Mother   . Cancer Maternal Aunt   . Cancer Maternal Grandmother   . Healthy Sister   . Healthy Brother   . Stroke Neg Hx   . Diabetes Neg Hx     Past Surgical History:  Procedure Laterality Date  . ABDOMINAL AORTOGRAM W/LOWER EXTREMITY N/A 05/30/2017   Procedure: ABDOMINAL AORTOGRAM W/LOWER EXTREMITY;  Surgeon: Elam Dutch, MD;  Location: [facility];  Service: Cardiovascular;  Laterality: N/A;  . ANKLE ARTHROSCOPY  01/17/2012   Procedure: ANKLE ARTHROSCOPY;  Surgeon: [name], MD;  Location: [facility];  Service: Orthopedics;  Laterality: Left;  . ANKLE ARTHROSCOPY WITH FUSION Left 10/02/2016   Procedure: Left Posterior Subtalar Arthrodesis Arthroscopy;  Surgeon: [name], MD;  Location: [facility];  Service: Orthopedics;  Laterality: Left;  . ANKLE FUSION Left 11/2012   Dr Sharol Given  . ANKLE SURGERY  2003   Left  . BRAIN SURGERY  2009   Biopsy  . COLONOSCOPY WITH PROPOFOL N/A 03/11/2019   Procedure: COLONOSCOPY WITH PROPOFOL;  Surgeon: Mauri Pole, MD;  Location: [facility];  Service: Endoscopy;  Laterality: N/A;  . FRACTURE SURGERY  2003   Left ankle  . HARDWARE REMOVAL Left 12/08/2012   Procedure: HARDWARE REMOVAL;  Surgeon: [name], MD;  Location: [facility];  Service: Orthopedics;  Laterality: Left;  Removal Deep Hardware, Take Down Non-Union, Revision Internal Fixation Left Ankle  . I & D EXTREMITY Left 01/01/2013   Procedure: IRRIGATION AND DEBRIDEMENT EXTREMITY;  Surgeon: [name], MD;   Location: [facility];  Service: Orthopedics;  Laterality: Left;  Irrigation, Debridement and Placement Antibiotic Beads Left Ankle  . INNER EAR SURGERY    . OPEN REDUCTION INTERNAL FIXATION (ORIF) SCAPHOID WITH DISTAL RADIUS GRAFT Right 02/01/2013   Procedure: OPEN REDUCTION INTERNAL FIXATION (ORIF) RIGHT SCAPHOID FRACTURE WITH DISTAL RADIUS GRAFT;  Surgeon: Schuyler Amor, MD;  Location: [facility];  Service: Orthopedics;  Laterality: Right;  . ORIF ANKLE FRACTURE Left 12/08/2012   Procedure: OPEN REDUCTION INTERNAL FIXATION (ORIF) ANKLE FRACTURE;  Surgeon: [name], MD;  Location: [facility];  Service: Orthopedics;  Laterality: Left;  Removal Deep Hardware, Take Down Non-Union, Revision Internal Fixation Left Ankle  . ORIF ANKLE FRACTURE Right 09/03/2013   Procedure: OPEN REDUCTION INTERNAL FIXATION (ORIF) ANKLE FRACTURE;  Surgeon: [name], MD;  Location: [facility];  Service: Orthopedics;  Laterality: Right;  Open Reduction Internal Fixation Right Fibula  . POLYPECTOMY  03/11/2019   Procedure: POLYPECTOMY;  Surgeon: Mauri Pole, MD;  Location: [facility];  Service: Endoscopy;;  . SCAPHOID FRACTURE SURGERY  02/04/2013  . SHOULDER ARTHROSCOPY  2008   Left  . TYMPANOSTOMY TUBE PLACEMENT  1993   Social History   Occupational History    Employer: NOT EMPLOYED    Comment: Disabled  Tobacco Use  . Smoking status: Current Every Day Smoker    Packs/day: 1.00    Years: 40.00    Pack years: 40.00    Types: Cigarettes  . Smokeless tobacco: Never Used  Vaping Use  . Vaping Use: Never used  Substance and Sexual Activity  . Alcohol use: No    Alcohol/week: 0.0 standard drinks    Comment: quit Oct. 2017(heavy drinker) .In [place]  . Drug use: No  . Sexual activity: Not Currently

## 2020-06-21 ENCOUNTER — Encounter (HOSPITAL_COMMUNITY): Payer: Self-pay | Admitting: Physical Therapy

## 2020-06-21 NOTE — Therapy (Signed)
[place], [place], [address] Phone: [phone]   Fax:  [phone]  Patient Details  Name: Harold Bailey MRN: [id number] Date of Birth: 10-25-1965 Referring Provider:  No ref. provider found  Encounter Date: 06/21/2020   PHYSICAL THERAPY DISCHARGE SUMMARY  Visits from Start of Care: 4  Current functional level related to goals / functional outcomes: Unknown as patient did not return to therapy for a re-assessment   Remaining deficits: Unknown as patient did not return to therapy for a re-assessment      Education / Equipment: HEP Plan: Patient agrees to discharge.  Patient goals were not met. Patient is being discharged due to the patient's request.  ?????     Clarene Critchley PT, DPT 10:34 AM, 06/21/20 [place], [place], [address] Phone: [phone]   Fax:  [phone]

## 2020-06-30 ENCOUNTER — Other Ambulatory Visit: Payer: Self-pay | Admitting: Radiology

## 2020-06-30 ENCOUNTER — Telehealth: Payer: Self-pay

## 2020-06-30 DIAGNOSIS — Z981 Arthrodesis status: Secondary | ICD-10-CM

## 2020-06-30 NOTE — Telephone Encounter (Signed)
Can you please help with this one?

## 2020-06-30 NOTE — Telephone Encounter (Signed)
patient called in wanting to speak about pain he is having

## 2020-06-30 NOTE — Telephone Encounter (Signed)
Spoke with patient. He wanted to know if he needed an ankle fusion or not. I explained that Dr.Duda wanted him to see [facility] first and then return for an office visit to speak about options regarding his ankle. Patient understands. He is going to wait by the phone for [facility] to call him for an appointment.

## 2020-07-06 ENCOUNTER — Ambulatory Visit (INDEPENDENT_AMBULATORY_CARE_PROVIDER_SITE_OTHER): Payer: Medicare HMO | Admitting: Physician Assistant

## 2020-07-06 ENCOUNTER — Ambulatory Visit (HOSPITAL_COMMUNITY)
Admission: RE | Admit: 2020-07-06 | Discharge: 2020-07-06 | Disposition: A | Discharge status: Home / Self Care | Payer: Medicare HMO | Source: Ambulatory Visit | Attending: Physician Assistant | Admitting: Physician Assistant

## 2020-07-06 ENCOUNTER — Other Ambulatory Visit: Payer: Self-pay

## 2020-07-06 VITALS — BP 138/85 | HR 88 | Temp 99.0°F | Resp 20 | Ht 72.0 in | Wt 234.0 lb

## 2020-07-06 DIAGNOSIS — I739 Peripheral vascular disease, unspecified: Secondary | ICD-10-CM

## 2020-07-06 DIAGNOSIS — Z981 Arthrodesis status: Secondary | ICD-10-CM | POA: Diagnosis present

## 2020-07-06 NOTE — Progress Notes (Signed)
HISTORY AND PHYSICAL     CC:  follow up. Requesting Provider:  Lucia Gaskins, MD  HPI: This is a 54 y.o. male who is here today for follow up for PAD and has been followed by Dr. Donzetta Matters.  He has hx of multiple surgeries to the left ankle from fractures.   He was last seen by Dr. Donzetta Matters in May 2019.  Pt had been hospitalized the previous year with MRSA PNA and found to have coolness of his extremities.  He had an angiogram that demonstrated bilateral SFA occlusions.  He had hx of upper extremity DVT as well as lower extremity DVT and was taking Eliquis.  He was smoking at that time and Dr. Donzetta Matters discussed with pt about smoking cessation.  He did have bilateral ankle pain and left thigh pain but he felt this was due to his previous surgeries.  He was able to walk and did not have any wounds on his feet. He recommended daily baby aspirin and f/u in one year with ABI's.    The pt returns today for follow up.  He states he has had multiple surgeries on both ankles.  He has seen Dr. Sharol Given and he states he needs right ankle surgery.  Dr. Sharol Given felt that he needed a vascular evaluation prior to proceeding with surgery.  Pt states he does not have pain in his feet.  He denies any non healing wounds.  He states that he does get some swelling in his legs.  When asked about cramping, he states that he gets a cord like cramp in his right calf at times, sometimes walking.  He states that he needs his ankle fixed bc his right foot collapses on him and he falls.    Pt had arteriogram in 2018 by Dr. Oneida Alar and this revealed #1 right leg right above-knee popliteal occlusion with reconstitution of the below-knee popliteal artery is one-vessel runoff posterior tibial artery #2 left leg occlusion above-knee popliteal artery reconstitution of very diseased below-knee popliteal artery one-vessel runoff posterior tibial artery to the left foot.  He was to be scheduled for a left femoral to PT bypass but this was  Deferred until  he was stronger.  He did not have bypass surgery.    He states that he is pre-diabetic, but he is currently on Metformin.  He continues to smoke.    He does wear 4LO2NC at night.   The pt is on a statin for cholesterol management.    The pt is on an aspirin.    Other AC:  none The pt is on BB for hypertension.  The pt does not have diabetes. Tobacco hx:  current  Pt does not have family hx of AAA.  Past Medical History:  Diagnosis Date  . Anxiety   . Arthritis    Left ankle, hand  . COPD (chronic obstructive pulmonary disease) ([place])   . COPD (chronic obstructive pulmonary disease) ([place])   . Drug abuse ([place])   . DVT (deep venous thrombosis) ([facility])    Bilateral legs  . Gait disturbance   . GERD (gastroesophageal reflux disease)   . 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  Marland Kitchen History of ankle surgery   . History of brain tumor   . Hyperlipidemia   . Hypertension   . 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 Memory loss   . On home oxygen therapy 08/26/2018  at night while lying down  . Peripheral vision loss    Left due to brain surgery  . Pneumonia   . Polycythemia   . Pre-diabetes   . Seizures ([place]) 11/19/2013   none since 2016  . Shortness of breath    Hx of smoking.  Marland Kitchen SVT (supraventricular tachycardia) ([place])   . Wears dentures   . Wears glasses     Past Surgical History:  Procedure Laterality Date  . ABDOMINAL AORTOGRAM W/LOWER EXTREMITY N/A 05/30/2017   Procedure: ABDOMINAL AORTOGRAM W/LOWER EXTREMITY;  Surgeon: Elam Dutch, MD;  Location: [facility];  Service: Cardiovascular;  Laterality: N/A;  . ANKLE ARTHROSCOPY  01/17/2012   Procedure: ANKLE ARTHROSCOPY;  Surgeon: [name], MD;  Location: [facility];  Service: Orthopedics;  Laterality: Left;  . ANKLE ARTHROSCOPY WITH FUSION Left 10/02/2016   Procedure: Left Posterior Subtalar Arthrodesis Arthroscopy;  Surgeon: [name],  MD;  Location: [facility];  Service: Orthopedics;  Laterality: Left;  . ANKLE FUSION Left 11/2012   Dr Sharol Given  . ANKLE SURGERY  2003   Left  . BRAIN SURGERY  2009   Biopsy  . COLONOSCOPY WITH PROPOFOL N/A 03/11/2019   Procedure: COLONOSCOPY WITH PROPOFOL;  Surgeon: Mauri Pole, MD;  Location: [facility];  Service: Endoscopy;  Laterality: N/A;  . FRACTURE SURGERY  2003   Left ankle  . HARDWARE REMOVAL Left 12/08/2012   Procedure: HARDWARE REMOVAL;  Surgeon: [name], MD;  Location: [facility];  Service: Orthopedics;  Laterality: Left;  Removal Deep Hardware, Take Down Non-Union, Revision Internal Fixation Left Ankle  . I & D EXTREMITY Left 01/01/2013   Procedure: IRRIGATION AND DEBRIDEMENT EXTREMITY;  Surgeon: [name], MD;  Location: [facility];  Service: Orthopedics;  Laterality: Left;  Irrigation, Debridement and Placement Antibiotic Beads Left Ankle  . INNER EAR SURGERY    . OPEN REDUCTION INTERNAL FIXATION (ORIF) SCAPHOID WITH DISTAL RADIUS GRAFT Right 02/01/2013   Procedure: OPEN REDUCTION INTERNAL FIXATION (ORIF) RIGHT SCAPHOID FRACTURE WITH DISTAL RADIUS GRAFT;  Surgeon: Schuyler Amor, MD;  Location: [facility];  Service: Orthopedics;  Laterality: Right;  . ORIF ANKLE FRACTURE Left 12/08/2012   Procedure: OPEN REDUCTION INTERNAL FIXATION (ORIF) ANKLE FRACTURE;  Surgeon: [name], MD;  Location: [facility];  Service: Orthopedics;  Laterality: Left;  Removal Deep Hardware, Take Down Non-Union, Revision Internal Fixation Left Ankle  . ORIF ANKLE FRACTURE Right 09/03/2013   Procedure: OPEN REDUCTION INTERNAL FIXATION (ORIF) ANKLE FRACTURE;  Surgeon: [name], MD;  Location: [facility];  Service: Orthopedics;  Laterality: Right;  Open Reduction Internal Fixation Right Fibula  . POLYPECTOMY  03/11/2019   Procedure: POLYPECTOMY;  Surgeon: Mauri Pole, MD;  Location: [facility];  Service: Endoscopy;;  . SCAPHOID FRACTURE SURGERY  02/04/2013  . SHOULDER ARTHROSCOPY  2008   Left  .  TYMPANOSTOMY TUBE PLACEMENT  1993    Allergies  Allergen Reactions  . Hydrocodone Rash    Can take percocet  . Lyrica [Pregabalin] Swelling    Swelling and itching in hands  . Other     No narcotics due to history of opioid dependence    Current Outpatient Medications  Medication Sig Dispense Refill  . albuterol (PROVENTIL HFA;VENTOLIN HFA) 108 (90 Base) MCG/ACT inhaler Inhale 2 puffs into the lungs every 6 (six) hours as needed for wheezing or shortness of breath. 1 Inhaler 6  . Alfalfa 500 MG TABS Take 1,000 mg by mouth 2 (  two) times a day.    . allopurinol (ZYLOPRIM) 300 MG tablet Take 300 mg by mouth daily.    Marland Kitchen ALPRAZolam (XANAX) 1 MG tablet Take 1 mg by mouth 3 (three) times daily as needed for anxiety.     Marland Kitchen amitriptyline (ELAVIL) 50 MG tablet Take 1 tablet (50 mg total) by mouth at bedtime. 30 tablet 5  . ascorbic acid (VITAMIN C) 1000 MG tablet Take 1,000 mg by mouth daily.    Marland Kitchen aspirin EC 81 MG tablet Take 81 mg by mouth daily.    . Aspirin-Salicylamide-Caffeine (BC HEADACHE POWDER PO) Take 1 packet by mouth daily as needed (pain).    Marland Kitchen BREO ELLIPTA 200-25 MCG/INH AEPB Inhale 1 puff into the lungs daily.    . Chlorphen-Phenyleph-ASA (ALKA-SELTZER PLUS COLD PO) Take 1 tablet by mouth daily as needed (for cold).    . divalproex (DEPAKOTE ER) 500 MG 24 hr tablet TAKE 2 TABLETS(1000 MG) BY MOUTH DAILY (Patient taking differently: Take 1,000 mg by mouth daily. ) 180 tablet 3  . gabapentin (NEURONTIN) 300 MG capsule TAKE 1 CAPSULE(300 MG) BY MOUTH TWICE DAILY (Patient taking differently: Take 300 mg by mouth 2 (two) times daily. ) 60 capsule 5  . ibuprofen (ADVIL) 800 MG tablet Take 800 mg by mouth every 8 (eight) hours as needed for mild pain or moderate pain. Do not take with meloxicam    . ketoconazole (NIZORAL) 2 % cream Apply 1 application topically daily.     Marland Kitchen LECITHIN PO Take 1 capsule by mouth daily.    . meloxicam (MOBIC) 7.5 MG tablet TAKE 1 TABLET(7.5 MG) BY MOUTH DAILY  90 tablet 0  . metFORMIN (GLUCOPHAGE) 500 MG tablet Take 500 mg by mouth 2 (two) times daily.    . metoprolol succinate (TOPROL-XL) 50 MG 24 hr tablet Take 50 mg by mouth daily.    . Multiple Vitamins-Minerals (MULTIVITAMIN WITH MINERALS) tablet Take 2 tablets by mouth daily.     . Naphazoline-Pheniramine (OPCON-A) 0.027-0.315 % SOLN Place 1 drop into both eyes every 8 (eight) hours as needed (red, irritated eyes).    . Omega-3 1000 MG CAPS Take 1 capsule by mouth daily.    Marland Kitchen omeprazole (PRILOSEC) 40 MG capsule Take 40 mg by mouth daily as needed (acid reflux).     . Oxcarbazepine (TRILEPTAL) 300 MG tablet Take 1.5 tablets (450 mg total) by mouth 2 (two) times daily. (Patient taking differently: Take 300 mg by mouth in the morning, at noon, and at bedtime. ) 90 tablet 5  . OXYGEN Inhale into the lungs. At home oxygen    . predniSONE (DELTASONE) 50 MG tablet Take one tablet by mouth once daily for 5 days 5 tablet 0  . rosuvastatin (CRESTOR) 10 MG tablet Take 10 mg by mouth daily at 6 PM.    . TRELEGY ELLIPTA 100-62.5-25 MCG/INH AEPB INHALE 1 PUFF INTO THE LUNGS DAILY (Patient taking differently: Take 1 puff by mouth daily. ) 60 each 5  . VOLTAREN 1 % GEL APPLY 2 TO 4 GRAMS EXTERNALLY TO THE AFFECTED AREA TWICE DAILY (Patient taking differently: Apply 4 g topically 4 (four) times daily. ) 500 g 0   No current facility-administered medications for this visit.    Family History  Problem Relation Age of Onset  . Hypertension Mother   . Bronchitis Mother   . Cancer Maternal Aunt   . Cancer Maternal Grandmother   . Healthy Sister   . Healthy Brother   .  Stroke Neg Hx   . Diabetes Neg Hx     Social History   Socioeconomic History  . Marital status: Married    Spouse name: Vickie  . Number of children: 1  . Years of education: 34  . Highest education level: High school graduate  Occupational History    Employer: NOT EMPLOYED    Comment: Disabled  Tobacco Use  . Smoking status: Current  Every Day Smoker    Packs/day: 1.00    Years: 40.00    Pack years: 40.00    Types: Cigarettes  . Smokeless tobacco: Never Used  Vaping Use  . Vaping Use: Never used  Substance and Sexual Activity  . Alcohol use: No    Alcohol/week: 0.0 standard drinks    Comment: quit Oct. 2017(heavy drinker) .In [place]  . Drug use: No  . Sexual activity: Not Currently  Other Topics Concern  . Not on file  Social History Narrative   Patient lives at home with his wife Maxwell Caul)    Disabled   Left handed   Education 12 th    Caffeine Coffee four cups a day no tea no soda mostly water   Social Determinants of Health   Financial Resource Strain:   . Difficulty of Paying Living Expenses: Not on file  Food Insecurity:   . Worried About Charity fundraiser in the Last Year: Not on file  . Ran Out of Food in the Last Year: Not on file  Transportation Needs:   . Lack of Transportation (Medical): Not on file  . Lack of Transportation (Non-Medical): Not on file  Physical Activity:   . Days of Exercise per Week: Not on file  . Minutes of Exercise per Session: Not on file  Stress:   . Feeling of Stress : Not on file  Social Connections:   . Frequency of Communication with Friends and Family: Not on file  . Frequency of Social Gatherings with Friends and Family: Not on file  . Attends Religious Services: Not on file  . Active Member of Clubs or Organizations: Not on file  . Attends Archivist Meetings: Not on file  . Marital Status: Not on file  Intimate Partner Violence:   . Fear of Current or Ex-Partner: Not on file  . Emotionally Abused: Not on file  . Physically Abused: Not on file  . Sexually Abused: Not on file     REVIEW OF SYSTEMS:   [X]  denotes positive finding, [ ]  denotes negative finding Cardiac  Comments:  Chest pain or chest pressure:    Shortness of breath upon exertion:    Short of breath when lying flat:    Irregular heart rhythm:        Vascular    Pain in calf,  thigh, or hip brought on by ambulation: x   Pain in feet at night that wakes you up from your sleep:     Blood clot in your veins:    Leg swelling:  x       Pulmonary    Oxygen at home: x 4LO2NC at night  Productive cough:     Wheezing:         Neurologic    Sudden weakness in arms or legs:     Sudden numbness in arms or legs:     Sudden onset of difficulty speaking or slurred speech:    Temporary loss of vision in one eye:     Problems with dizziness:  Gastrointestinal    Blood in stool:     Vomited blood:         Genitourinary    Burning when urinating:     Blood in urine:        Psychiatric    Major depression:         Hematologic    Bleeding problems:    Problems with blood clotting too easily:        Skin    Rashes or ulcers:        Constitutional    Fever or chills:      PHYSICAL EXAMINATION:  Today's Vitals   07/06/20 1539  BP: 138/85  Pulse: 88  Resp: 20  Temp: 99 F (37.2 C)  SpO2: 90%  Weight: 234 lb (106.1 kg)  Height: 6' (1.829 m)   Body mass index is 31.74 kg/m.   General:  WDWN in NAD; vital signs documented above Gait: with cane HENT: WNL, normocephalic Pulmonary: normal non-labored breathing , without wheezing Cardiac: regular but tachy HR, without  Murmur; without carotid bruits Abdomen: soft, NT, no masses; aortic pulse is not palpable Skin: without rashes Vascular Exam/Pulses:  Right Left  Radial 2+ (normal) 2+ (normal)  Ulnar Unable to palpate Unable to palpate  Popliteal Unable to palpate Unable to palpate  DP absent absent  PT monophasic monophasic  Peroneal absent absent   Extremities: delayed capillary refill right foot; left foot slightly warmer than right Musculoskeletal: no muscle wasting or atrophy  Neurologic: A&O X 3;  No focal weakness or paresthesias are detected Psychiatric:  The pt has Normal affect.   Non-Invasive Vascular Imaging:   ABI's/TBI's on 07/06/2020: Right:  0.91/0.62 - Great toe  pressure: 87 Left:  0.85/0.81 - Great toe pressure: 114   Previous ABI's/TBI's on 01/23/2018: Right:  0.79/0.51  Left:  0.64/0.55    ASSESSMENT/PLAN:: 53 y.o. male here for follow up for PAD  PAD -pt's ABI's today most likely falsely elevated.  He has delayed capillary refill and I can only find a monophasic bilateral PT doppler signal.  On his arteriogram in 2018, he had one vessel runoff.  Discussed ABI's and pt's past arteriogram with Dr. Scot Dock.  Given he is going to need right ankle surgery by Dr. Sharol Given, will schedule an arteriogram to evaluate his blood flow.  -pt would like to schedule this as soon as possible. -discussed with pt to definitely continue his asa/statin  Current Smoker -Discussed greater than 5 minutes importance of smoking cessation.  Given he is diabetic and he already has evidence of PAD in his lower extremities, he is at risk for limb loss with continued smoking.  Also discussed risk of MI, stroke as well as multitude of cancers as well as his pulmonary issues.  Discussed 1-800-QUIT-NOW.  He does want to quit smoking.   Leontine Locket, [facility] [phone]  Clinic MD:   Scot Dock

## 2020-07-07 ENCOUNTER — Other Ambulatory Visit: Payer: Self-pay

## 2020-07-07 ENCOUNTER — Other Ambulatory Visit (HOSPITAL_COMMUNITY): Payer: Medicare HMO

## 2020-07-11 ENCOUNTER — Other Ambulatory Visit (HOSPITAL_COMMUNITY)
Admission: RE | Admit: 2020-07-11 | Discharge: 2020-07-11 | Disposition: A | Discharge status: Home / Self Care | Payer: Medicare HMO | Source: Ambulatory Visit | Attending: Vascular Surgery | Admitting: Vascular Surgery

## 2020-07-11 DIAGNOSIS — Z20822 Contact with and (suspected) exposure to covid-19: Secondary | ICD-10-CM | POA: Insufficient documentation

## 2020-07-11 DIAGNOSIS — Z01812 Encounter for preprocedural laboratory examination: Secondary | ICD-10-CM | POA: Diagnosis present

## 2020-07-11 LAB — SARS CORONAVIRUS 2 (TAT 6-24 HRS): SARS Coronavirus 2: NEGATIVE

## 2020-07-12 ENCOUNTER — Telehealth: Payer: Self-pay | Admitting: Orthopedic Surgery

## 2020-07-12 ENCOUNTER — Encounter (HOSPITAL_COMMUNITY)
Admission: RE | Disposition: A | Discharge status: Home / Self Care | Payer: Self-pay | Source: Home / Self Care | Attending: Vascular Surgery

## 2020-07-12 ENCOUNTER — Other Ambulatory Visit: Payer: Self-pay

## 2020-07-12 ENCOUNTER — Ambulatory Visit (HOSPITAL_COMMUNITY)
Admission: RE | Admit: 2020-07-12 | Discharge: 2020-07-12 | Disposition: A | Discharge status: Home / Self Care | Payer: Medicare HMO | Attending: Vascular Surgery | Admitting: Vascular Surgery

## 2020-07-12 DIAGNOSIS — F17210 Nicotine dependence, cigarettes, uncomplicated: Secondary | ICD-10-CM | POA: Insufficient documentation

## 2020-07-12 DIAGNOSIS — I70203 Unspecified atherosclerosis of native arteries of extremities, bilateral legs: Secondary | ICD-10-CM | POA: Insufficient documentation

## 2020-07-12 DIAGNOSIS — Z8669 Personal history of other diseases of the nervous system and sense organs: Secondary | ICD-10-CM | POA: Diagnosis not present

## 2020-07-12 DIAGNOSIS — K219 Gastro-esophageal reflux disease without esophagitis: Secondary | ICD-10-CM | POA: Insufficient documentation

## 2020-07-12 DIAGNOSIS — Z79899 Other long term (current) drug therapy: Secondary | ICD-10-CM | POA: Diagnosis not present

## 2020-07-12 DIAGNOSIS — Z86718 Personal history of other venous thrombosis and embolism: Secondary | ICD-10-CM | POA: Diagnosis not present

## 2020-07-12 DIAGNOSIS — Z7951 Long term (current) use of inhaled steroids: Secondary | ICD-10-CM | POA: Insufficient documentation

## 2020-07-12 DIAGNOSIS — Z7984 Long term (current) use of oral hypoglycemic drugs: Secondary | ICD-10-CM | POA: Diagnosis not present

## 2020-07-12 DIAGNOSIS — Z888 Allergy status to other drugs, medicaments and biological substances status: Secondary | ICD-10-CM | POA: Insufficient documentation

## 2020-07-12 DIAGNOSIS — Z9981 Dependence on supplemental oxygen: Secondary | ICD-10-CM | POA: Diagnosis not present

## 2020-07-12 DIAGNOSIS — I739 Peripheral vascular disease, unspecified: Secondary | ICD-10-CM | POA: Diagnosis not present

## 2020-07-12 DIAGNOSIS — E119 Type 2 diabetes mellitus without complications: Secondary | ICD-10-CM | POA: Diagnosis not present

## 2020-07-12 DIAGNOSIS — F419 Anxiety disorder, unspecified: Secondary | ICD-10-CM | POA: Insufficient documentation

## 2020-07-12 DIAGNOSIS — E785 Hyperlipidemia, unspecified: Secondary | ICD-10-CM | POA: Insufficient documentation

## 2020-07-12 DIAGNOSIS — Z885 Allergy status to narcotic agent status: Secondary | ICD-10-CM | POA: Diagnosis not present

## 2020-07-12 DIAGNOSIS — Z7982 Long term (current) use of aspirin: Secondary | ICD-10-CM | POA: Diagnosis not present

## 2020-07-12 DIAGNOSIS — Z833 Family history of diabetes mellitus: Secondary | ICD-10-CM | POA: Diagnosis not present

## 2020-07-12 DIAGNOSIS — I10 Essential (primary) hypertension: Secondary | ICD-10-CM | POA: Diagnosis not present

## 2020-07-12 DIAGNOSIS — H5462 Unqualified visual loss, left eye, normal vision right eye: Secondary | ICD-10-CM | POA: Insufficient documentation

## 2020-07-12 DIAGNOSIS — M1990 Unspecified osteoarthritis, unspecified site: Secondary | ICD-10-CM | POA: Diagnosis not present

## 2020-07-12 DIAGNOSIS — J449 Chronic obstructive pulmonary disease, unspecified: Secondary | ICD-10-CM | POA: Insufficient documentation

## 2020-07-12 DIAGNOSIS — Z8249 Family history of ischemic heart disease and other diseases of the circulatory system: Secondary | ICD-10-CM | POA: Diagnosis not present

## 2020-07-12 HISTORY — PX: ABDOMINAL AORTOGRAM W/LOWER EXTREMITY: CATH118223

## 2020-07-12 LAB — POCT I-STAT, CHEM 8
BUN: 17 mg/dL (ref 6–20)
Calcium, Ion: 1.2 mmol/L (ref 1.15–1.40)
Chloride: 99 mmol/L (ref 98–111)
Creatinine, Ser: 0.8 mg/dL (ref 0.61–1.24)
Glucose, Bld: 86 mg/dL (ref 70–99)
HCT: 64 % — ABNORMAL HIGH (ref 39.0–52.0)
Hemoglobin: 21.8 g/dL (ref 13.0–17.0)
Potassium: 4.7 mmol/L (ref 3.5–5.1)
Sodium: 140 mmol/L (ref 135–145)
TCO2: 32 mmol/L (ref 22–32)

## 2020-07-12 SURGERY — ABDOMINAL AORTOGRAM W/LOWER EXTREMITY
Anesthesia: LOCAL

## 2020-07-12 MED ORDER — ONDANSETRON HCL 4 MG/2ML IJ SOLN: 4.0000 mg | Freq: Four times a day (QID) | INTRAMUSCULAR | Status: DC | PRN

## 2020-07-12 MED ORDER — LABETALOL HCL 5 MG/ML IV SOLN: 10.0000 mg | INTRAVENOUS | Status: DC | PRN

## 2020-07-12 MED ORDER — SODIUM CHLORIDE 0.9% FLUSH: 3.0000 mL | INTRAVENOUS | Status: DC | PRN

## 2020-07-12 MED ORDER — LIDOCAINE HCL (PF) 1 % IJ SOLN
INTRAMUSCULAR | Status: AC
  Filled 2020-07-12: qty 30

## 2020-07-12 MED ORDER — HEPARIN (PORCINE) IN NACL 1000-0.9 UT/500ML-% IV SOLN
INTRAVENOUS | Status: AC
  Filled 2020-07-12: qty 500

## 2020-07-12 MED ORDER — HEPARIN (PORCINE) IN NACL 1000-0.9 UT/500ML-% IV SOLN
INTRAVENOUS | Status: DC | PRN
  Administered 2020-07-12 (×2): 500 mL

## 2020-07-12 MED ORDER — SODIUM CHLORIDE 0.9 % WEIGHT BASED INFUSION: 1.0000 mL/kg/h | INTRAVENOUS | Status: DC

## 2020-07-12 MED ORDER — SODIUM CHLORIDE 0.9% FLUSH: 3.0000 mL | Freq: Two times a day (BID) | INTRAVENOUS | Status: DC

## 2020-07-12 MED ORDER — LIDOCAINE HCL (PF) 1 % IJ SOLN
INTRAMUSCULAR | Status: DC | PRN
  Administered 2020-07-12: 15 mL via INTRADERMAL

## 2020-07-12 MED ORDER — HYDRALAZINE HCL 20 MG/ML IJ SOLN: 5.0000 mg | INTRAMUSCULAR | Status: DC | PRN

## 2020-07-12 MED ORDER — SODIUM CHLORIDE 0.9 % IV SOLN: INTRAVENOUS | Status: DC

## 2020-07-12 MED ORDER — IODIXANOL 320 MG/ML IV SOLN
INTRAVENOUS | Status: DC | PRN
  Administered 2020-07-12: 97 mL via INTRA_ARTERIAL

## 2020-07-12 MED ORDER — ACETAMINOPHEN 325 MG PO TABS: 650.0000 mg | ORAL_TABLET | ORAL | Status: DC | PRN

## 2020-07-12 MED ORDER — SODIUM CHLORIDE 0.9 % IV SOLN: 250.0000 mL | INTRAVENOUS | Status: DC | PRN

## 2020-07-12 SURGICAL SUPPLY — 11 items
CATH OMNI FLUSH 5F 65CM (CATHETERS) ×1 IMPLANT
DEVICE CLOSURE MYNXGRIP 5F (Vascular Products) ×1 IMPLANT
KIT MICROPUNCTURE NIT STIFF (SHEATH) ×1 IMPLANT
KIT PV (KITS) ×2 IMPLANT
SHEATH PINNACLE 5F 10CM (SHEATH) ×1 IMPLANT
SHEATH PROBE COVER 6X72 (BAG) ×1 IMPLANT
SYR MEDRAD MARK 7 150ML (SYRINGE) ×2 IMPLANT
TRANSDUCER W/STOPCOCK (MISCELLANEOUS) ×2 IMPLANT
TRAY PV CATH (CUSTOM PROCEDURE TRAY) ×2 IMPLANT
WIRE BENTSON .035X145CM (WIRE) ×1 IMPLANT
WIRE MICROPUNCTURE .018X50CM (WIRE) ×1 IMPLANT

## 2020-07-12 NOTE — Op Note (Signed)
    Patient name: Harold Bailey MRN: [id number] DOB: 10-May-1966 Sex: male  07/12/2020 Pre-operative Diagnosis: Abnormal ABIs with pending right ankle surgery Post-operative diagnosis:  Same Surgeon:  [name], MD Procedure Performed: 1.  Ultrasound-guided access of left common femoral artery 2.  Aortoram including catheter selection of aorta 3.  Bilateral lower extremity arteriogram with runoff 4.  Mynx closure of the left common femoral artery  Indications: 54 year old male well known to the vascular surgery service who was recently seen in clinic for evaluation of PAD prior to scheduled ankle surgery with Dr. Sharol Given.  Patient was found to have abnormal ABIs on the right in setting of known popliteal occlusion and presents for planned arteriogram after risks and benefits discussed.  Findings:   Aortogram showed patent right and 2 patent left renal arteries.  There is no flow-limiting stenosis in the aortoiliac segment and it is all widely patent.  On the right which is the side of interest, he has a widely patent common femoral, profunda, SFA and then has a distal SFA occlusion at Hunter's canal with an occluded above-knee popliteal artery.  Popliteal artery reconstitutes behind the knee and below knee popliteal artery is widely patent with two-vessel runoff via posterior tibial and peroneal.  Runoff down the right leg is much more brisk than the left.  Left lower extremity runoff also shows a patent common femoral, profunda, and SFA with a distal SFA occlusion.  Flow was much more sluggish on the left leg and we did not completely see the runoff in the tibial vessels but did see a PT fill on very delayed imaging.     Procedure:  The patient was identified in the holding area and taken to room 8.  The patient was then placed supine on the table and prepped and draped in the usual sterile fashion.  A time out was called.  Ultrasound was used to evaluate the left common femoral  artery.  It was patent .  A digital ultrasound image was acquired.  A micropuncture needle was used to access the left common femoral artery under ultrasound guidance.  An 018 wire was advanced without resistance and a micropuncture sheath was placed.  The 018 wire was removed and a benson wire was placed.  The micropuncture sheath was exchanged for a 5 french sheath.  An omniflush catheter was advanced over the wire to the level of L-1.  An abdominal angiogram was obtained.  Next the catheter was pulled down and bilateral lower extremity runoff was obtained.  Pertinent findings are noted above.  Given no plans for vascular intervention today wires and catheters were removed.  A mynx closure device was deployed in the left common femoral artery.  He remained stable throughout the case and will be taken to holding.  Plan: Given patient is having no rest pain or active tissue loss at this time to suggest CLI, I did not see any role for vascular intervention today.  His ABIs from the clinic suggest biphasic waveforms at the ankle with a toe pressure of 87 which should be adequate for wound healing.  Discussed with him that if he has wound healing issues after ankle surgery would bring him back for right SFA above-knee popliteal intervention in the Cath Lab.   [name], MD [facility] Office: [phone]

## 2020-07-12 NOTE — H&P (Signed)
History and Physical Interval Note:  07/12/2020 10:50 AM  Harold Bailey  has presented today for surgery, with the diagnosis of peripheral vascular disease.  The various methods of treatment have been discussed with the patient and family. After consideration of risks, benefits and other options for treatment, the patient has consented to  Procedure(s): ABDOMINAL AORTOGRAM W/LOWER EXTREMITY (N/A) as a surgical intervention.  The patient's history has been reviewed, patient examined, no change in status, stable for surgery.  I have reviewed the patient's chart and labs.  Questions were answered to the patient's satisfaction.    Plan aortogram, right leg arteriogram.  Patient needs ankle surgery with Harold Bailey and was seen by PA in clinic.  He has no active tissue loss at this time and no rest pain in the foot.  The foot is warm.  His ABIs on 07/06/2020 are 0.91 biphasic waveform at the ankle with a toe pressure of 87.  Discussed I'd be happy to do arteriogram today but may not do any intervention until he develops a problem.  He is known to have a popliteal occlusion from old arteriogram.  Will not give him narcotics as requesting pre-op.   Harold Bailey  HISTORY AND PHYSICAL     CC:  follow up. Requesting Provider:  Lucia Gaskins, MD  HPI: This is a 54 y.o. male who is here today for follow up for PAD and has been followed by Harold Bailey.  He has hx of multiple surgeries to the left ankle from fractures.   He was last seen by Harold Bailey in May 2019.  Pt had been hospitalized the previous year with MRSA PNA and found to have coolness of his extremities.  He had an angiogram that demonstrated bilateral SFA occlusions.  He had hx of upper extremity DVT as well as lower extremity DVT and was taking Eliquis.  He was smoking at that time and Harold Bailey discussed with pt about smoking cessation.  He did have bilateral ankle pain and left thigh pain but he felt this was due to his previous  surgeries.  He was able to walk and did not have any wounds on his feet. He recommended daily baby aspirin and f/u in one year with ABI's.    The pt returns today for follow up.  He states he has had multiple surgeries on both ankles.  He has seen Harold Bailey and he states he needs right ankle surgery.  Harold Bailey felt that he needed a vascular evaluation prior to proceeding with surgery.  Pt states he does not have pain in his feet.  He denies any non healing wounds.  He states that he does get some swelling in his legs.  When asked about cramping, he states that he gets a cord like cramp in his right calf at times, sometimes walking.  He states that he needs his ankle fixed bc his right foot collapses on him and he falls.    Pt had arteriogram in 2018 by Harold Bailey and this revealed #1 right leg right above-knee popliteal occlusion with reconstitution of the below-knee popliteal artery is one-vessel runoff posterior tibial artery #2 left leg occlusion above-knee popliteal artery reconstitution of very diseased below-knee popliteal artery one-vessel runoff posterior tibial artery to the left foot.  He was to be scheduled for a left femoral to PT bypass but this was  Deferred until he was stronger.  He did not have bypass surgery.    He states that  he is pre-diabetic, but he is currently on Metformin.  He continues to smoke.    He does wear 4LO2NC at night.   The pt is on a statin for cholesterol management.    The pt is on an aspirin.    Other AC:  none The pt is on BB for hypertension.  The pt does not have diabetes. Tobacco hx:  current  Pt does not have family hx of AAA.      Past Medical History:  Diagnosis Date  . Anxiety   . Arthritis    Left ankle, hand  . COPD (chronic obstructive pulmonary disease) ([place])   . COPD (chronic obstructive pulmonary disease) ([place])   . Drug abuse ([place])   . DVT (deep venous thrombosis) ([facility])    Bilateral legs  . Gait disturbance   . GERD  (gastroesophageal reflux disease)   . 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  Marland Kitchen History of ankle surgery   . History of brain tumor   . Hyperlipidemia   . Hypertension   . 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 Memory loss   . On home oxygen therapy 08/26/2018   at night while lying down  . Peripheral vision loss    Left due to brain surgery  . Pneumonia   . Polycythemia   . Pre-diabetes   . Seizures (Harold Bailey) 11/19/2013   none since 2016  . Shortness of breath    Hx of smoking.  Marland Kitchen SVT (supraventricular tachycardia) ([place])   . Wears dentures   . Wears glasses          Past Surgical History:  Procedure Laterality Date  . ABDOMINAL AORTOGRAM W/LOWER EXTREMITY N/A 05/30/2017   Procedure: ABDOMINAL AORTOGRAM W/LOWER EXTREMITY;  Surgeon: Harold Dutch, MD;  Location: [facility];  Service: Cardiovascular;  Laterality: N/A;  . ANKLE ARTHROSCOPY  01/17/2012   Procedure: ANKLE ARTHROSCOPY;  Surgeon: Harold Minion, MD;  Location: [facility];  Service: Orthopedics;  Laterality: Left;  . ANKLE ARTHROSCOPY WITH FUSION Left 10/02/2016   Procedure: Left Posterior Subtalar Arthrodesis Arthroscopy;  Surgeon: Harold Minion, MD;  Location: [facility];  Service: Orthopedics;  Laterality: Left;  . ANKLE FUSION Left 11/2012   Dr Sharol Bailey  . ANKLE SURGERY  2003   Left  . BRAIN SURGERY  2009   Biopsy  . COLONOSCOPY WITH PROPOFOL N/A 03/11/2019   Procedure: COLONOSCOPY WITH PROPOFOL;  Surgeon: Mauri Pole, MD;  Location: [facility];  Service: Endoscopy;  Laterality: N/A;  . FRACTURE SURGERY  2003   Left ankle  . HARDWARE REMOVAL Left 12/08/2012   Procedure: HARDWARE REMOVAL;  Surgeon: Harold Minion, MD;  Location: [facility];  Service: Orthopedics;  Laterality: Left;  Removal Deep Hardware, Take Down Non-Union, Revision Internal Fixation Left Ankle  . I & D EXTREMITY Left 01/01/2013   Procedure:  IRRIGATION AND DEBRIDEMENT EXTREMITY;  Surgeon: Harold Minion, MD;  Location: [facility];  Service: Orthopedics;  Laterality: Left;  Irrigation, Debridement and Placement Antibiotic Beads Left Ankle  . INNER EAR SURGERY    . OPEN REDUCTION INTERNAL FIXATION (ORIF) SCAPHOID WITH DISTAL RADIUS GRAFT Right 02/01/2013   Procedure: OPEN REDUCTION INTERNAL FIXATION (ORIF) RIGHT SCAPHOID FRACTURE WITH DISTAL RADIUS GRAFT;  Surgeon: Schuyler Amor, MD;  Location: [facility];  Service: Orthopedics;  Laterality: Right;  . ORIF ANKLE FRACTURE Left 12/08/2012   Procedure:  OPEN REDUCTION INTERNAL FIXATION (ORIF) ANKLE FRACTURE;  Surgeon: Harold Minion, MD;  Location: [facility];  Service: Orthopedics;  Laterality: Left;  Removal Deep Hardware, Take Down Non-Union, Revision Internal Fixation Left Ankle  . ORIF ANKLE FRACTURE Right 09/03/2013   Procedure: OPEN REDUCTION INTERNAL FIXATION (ORIF) ANKLE FRACTURE;  Surgeon: Harold Minion, MD;  Location: [facility];  Service: Orthopedics;  Laterality: Right;  Open Reduction Internal Fixation Right Fibula  . POLYPECTOMY  03/11/2019   Procedure: POLYPECTOMY;  Surgeon: Mauri Pole, MD;  Location: [facility];  Service: Endoscopy;;  . SCAPHOID FRACTURE SURGERY  02/04/2013  . SHOULDER ARTHROSCOPY  2008   Left  . TYMPANOSTOMY TUBE PLACEMENT  1993         Allergies  Allergen Reactions  . Hydrocodone Rash    Can take percocet  . Lyrica [Pregabalin] Swelling    Swelling and itching in hands  . Other     No narcotics due to history of opioid dependence          Current Outpatient Medications  Medication Sig Dispense Refill  . albuterol (PROVENTIL HFA;VENTOLIN HFA) 108 (90 Base) MCG/ACT inhaler Inhale 2 puffs into the lungs every 6 (six) hours as needed for wheezing or shortness of breath. 1 Inhaler 6  . Alfalfa 500 MG TABS Take 1,000 mg by mouth 2 (two) times a day.    . allopurinol (ZYLOPRIM) 300 MG tablet Take 300 mg by mouth daily.    Marland Kitchen  ALPRAZolam (XANAX) 1 MG tablet Take 1 mg by mouth 3 (three) times daily as needed for anxiety.     Marland Kitchen amitriptyline (ELAVIL) 50 MG tablet Take 1 tablet (50 mg total) by mouth at bedtime. 30 tablet 5  . ascorbic acid (VITAMIN C) 1000 MG tablet Take 1,000 mg by mouth daily.    Marland Kitchen aspirin EC 81 MG tablet Take 81 mg by mouth daily.    . Aspirin-Salicylamide-Caffeine (BC HEADACHE POWDER PO) Take 1 packet by mouth daily as needed (pain).    Marland Kitchen BREO ELLIPTA 200-25 MCG/INH AEPB Inhale 1 puff into the lungs daily.    . Chlorphen-Phenyleph-ASA (ALKA-SELTZER PLUS COLD PO) Take 1 tablet by mouth daily as needed (for cold).    . divalproex (DEPAKOTE ER) 500 MG 24 hr tablet TAKE 2 TABLETS(1000 MG) BY MOUTH DAILY (Patient taking differently: Take 1,000 mg by mouth daily. ) 180 tablet 3  . gabapentin (NEURONTIN) 300 MG capsule TAKE 1 CAPSULE(300 MG) BY MOUTH TWICE DAILY (Patient taking differently: Take 300 mg by mouth 2 (two) times daily. ) 60 capsule 5  . ibuprofen (ADVIL) 800 MG tablet Take 800 mg by mouth every 8 (eight) hours as needed for mild pain or moderate pain. Do not take with meloxicam    . ketoconazole (NIZORAL) 2 % cream Apply 1 application topically daily.     Marland Kitchen LECITHIN PO Take 1 capsule by mouth daily.    . meloxicam (MOBIC) 7.5 MG tablet TAKE 1 TABLET(7.5 MG) BY MOUTH DAILY 90 tablet 0  . metFORMIN (GLUCOPHAGE) 500 MG tablet Take 500 mg by mouth 2 (two) times daily.    . metoprolol succinate (TOPROL-XL) 50 MG 24 hr tablet Take 50 mg by mouth daily.    . Multiple Vitamins-Minerals (MULTIVITAMIN WITH MINERALS) tablet Take 2 tablets by mouth daily.     . Naphazoline-Pheniramine (OPCON-A) 0.027-0.315 % SOLN Place 1 drop into both eyes every 8 (eight) hours as needed (red, irritated eyes).    . Omega-3 1000 MG CAPS  Take 1 capsule by mouth daily.    Marland Kitchen omeprazole (PRILOSEC) 40 MG capsule Take 40 mg by mouth daily as needed (acid reflux).     . Oxcarbazepine (TRILEPTAL) 300  MG tablet Take 1.5 tablets (450 mg total) by mouth 2 (two) times daily. (Patient taking differently: Take 300 mg by mouth in the morning, at noon, and at bedtime. ) 90 tablet 5  . OXYGEN Inhale into the lungs. At home oxygen    . predniSONE (DELTASONE) 50 MG tablet Take one tablet by mouth once daily for 5 days 5 tablet 0  . rosuvastatin (CRESTOR) 10 MG tablet Take 10 mg by mouth daily at 6 PM.    . TRELEGY ELLIPTA 100-62.5-25 MCG/INH AEPB INHALE 1 PUFF INTO THE LUNGS DAILY (Patient taking differently: Take 1 puff by mouth daily. ) 60 each 5  . VOLTAREN 1 % GEL APPLY 2 TO 4 GRAMS EXTERNALLY TO THE AFFECTED AREA TWICE DAILY (Patient taking differently: Apply 4 g topically 4 (four) times daily. ) 500 g 0   No current facility-administered medications for this visit.         Family History  Problem Relation Age of Onset  . Hypertension Mother   . Bronchitis Mother   . Cancer Maternal Aunt   . Cancer Maternal Grandmother   . Healthy Sister   . Healthy Brother   . Stroke Neg Hx   . Diabetes Neg Hx     Social History        Socioeconomic History  . Marital status: Married    Spouse name: Vickie  . Number of children: 1  . Years of education: 76  . Highest education level: High school graduate  Occupational History    Employer: NOT EMPLOYED    Comment: Disabled  Tobacco Use  . Smoking status: Current Every Day Smoker    Packs/day: 1.00    Years: 40.00    Pack years: 40.00    Types: Cigarettes  . Smokeless tobacco: Never Used  Vaping Use  . Vaping Use: Never used  Substance and Sexual Activity  . Alcohol use: No    Alcohol/week: 0.0 standard drinks    Comment: quit Oct. 2017(heavy drinker) .In [place]  . Drug use: No  . Sexual activity: Not Currently  Other Topics Concern  . Not on file  Social History Narrative   Patient lives at home with his wife Maxwell Caul)    Disabled   Left handed   Education 12 th    Caffeine Coffee four cups a  day no tea no soda mostly water   Social Determinants of Health      Financial Resource Strain:   . Difficulty of Paying Living Expenses: Not on file  Food Insecurity:   . Worried About Charity fundraiser in the Last Year: Not on file  . Ran Out of Food in the Last Year: Not on file  Transportation Needs:   . Lack of Transportation (Medical): Not on file  . Lack of Transportation (Non-Medical): Not on file  Physical Activity:   . Days of Exercise per Week: Not on file  . Minutes of Exercise per Session: Not on file  Stress:   . Feeling of Stress : Not on file  Social Connections:   . Frequency of Communication with Friends and Family: Not on file  . Frequency of Social Gatherings with Friends and Family: Not on file  . Attends Religious Services: Not on file  . Active Member of  Clubs or Organizations: Not on file  . Attends Archivist Meetings: Not on file  . Marital Status: Not on file  Intimate Partner Violence:   . Fear of Current or Ex-Partner: Not on file  . Emotionally Abused: Not on file  . Physically Abused: Not on file  . Sexually Abused: Not on file     REVIEW OF SYSTEMS:   [X]  denotes positive finding, [ ]  denotes negative finding Cardiac  Comments:  Chest pain or chest pressure:    Shortness of breath upon exertion:    Short of breath when lying flat:    Irregular heart rhythm:        Vascular    Pain in calf, thigh, or hip brought on by ambulation: x   Pain in feet at night that wakes you up from your sleep:     Blood clot in your veins:    Leg swelling:  x       Pulmonary    Oxygen at home: x 4LO2NC at night  Productive cough:     Wheezing:         Neurologic    Sudden weakness in arms or legs:     Sudden numbness in arms or legs:     Sudden onset of difficulty speaking or slurred speech:    Temporary loss of vision in one eye:     Problems with dizziness:         Gastrointestinal     Blood in stool:     Vomited blood:         Genitourinary    Burning when urinating:     Blood in urine:        Psychiatric    Major depression:         Hematologic    Bleeding problems:    Problems with blood clotting too easily:        Skin    Rashes or ulcers:        Constitutional    Fever or chills:      PHYSICAL EXAMINATION:     Today's Vitals   07/06/20 1539  BP: 138/85  Pulse: 88  Resp: 20  Temp: 99 F (37.2 C)  SpO2: 90%  Weight: 234 lb (106.1 kg)  Height: 6' (1.829 m)   Body mass index is 31.74 kg/m.   General:  WDWN in NAD; vital signs documented above Gait: with cane HENT: WNL, normocephalic Pulmonary: normal non-labored breathing , without wheezing Cardiac: regular but tachy HR, without  Murmur; without carotid bruits Abdomen: soft, NT, no masses; aortic pulse is not palpable Skin: without rashes Vascular Exam/Pulses:  Right Left  Radial 2+ (normal) 2+ (normal)  Ulnar Unable to palpate Unable to palpate  Popliteal Unable to palpate Unable to palpate  DP absent absent  PT monophasic monophasic  Peroneal absent absent   Extremities: delayed capillary refill right foot; left foot slightly warmer than right Musculoskeletal: no muscle wasting or atrophy       Neurologic: A&O X 3;  No focal weakness or paresthesias are detected Psychiatric:  The pt has Normal affect.   Non-Invasive Vascular Imaging:   ABI's/TBI's on 07/06/2020: Right:  0.91/0.62 - Great toe pressure: 87 Left:  0.85/0.81 - Great toe pressure: 114   Previous ABI's/TBI's on 01/23/2018: Right:  0.79/0.51  Left:  0.64/0.55    ASSESSMENT/PLAN:: 54 y.o. male here for follow up for PAD  PAD -pt's ABI's today most likely falsely elevated.  He  has delayed capillary refill and I can only find a monophasic bilateral PT doppler signal.  On his arteriogram in 2018, he had one vessel runoff.  Discussed ABI's and pt's past  arteriogram with Dr. Scot Dock.  Bailey he is going to need right ankle surgery by Harold Bailey, will schedule an arteriogram to evaluate his blood flow.  -pt would like to schedule this as soon as possible. -discussed with pt to definitely continue his asa/statin  Current Smoker -Discussed greater than 5 minutes importance of smoking cessation.  Bailey he is diabetic and he already has evidence of PAD in his lower extremities, he is at risk for limb loss with continued smoking.  Also discussed risk of MI, stroke as well as multitude of cancers as well as his pulmonary issues.  Discussed 1-800-QUIT-NOW.  He does want to quit smoking.   Leontine Locket, [facility] [phone]  Clinic MD:   Scot Dock

## 2020-07-12 NOTE — Discharge Instructions (Signed)
   HOLD METFORMIN FOR A FULL 48 HOURS AFTER DISCHARGE.  Femoral Site Care This sheet gives you information about how to care for yourself after your procedure. Your health care provider may also give you more specific instructions. If you have problems or questions, contact your health care provider. What can I expect after the procedure? After the procedure, it is common to have:  Bruising that usually fades within 1-2 weeks.  Tenderness at the site. Follow these instructions at home: Wound care 1. May remove bandage after 24 hours. 2. Do not take baths, swim, or use a hot tub for 5 days. 3. You may shower 24-48 hours after the procedure. ? Gently wash the site with plain soap and water. ? Pat the area dry with a clean towel. ? Do not rub the site. This may cause bleeding. 4. Do not apply powder or lotion to the site. Keep the site clean and dry. 5. Check your femoral site every day for signs of infection. Check for: ? Redness, swelling, or pain. ? Fluid or blood. ? Warmth. ? Pus or a bad smell. Activity 1. For the first 2-3 days after your procedure, or as long as directed: ? Avoid climbing stairs as much as possible. ? Do not squat. 2. Do not lift, push or pull anything that is heavier than 10 lb for 5 days. 3. Rest as directed. ? Avoid sitting for a long time without moving. Get up to take short walks every 1-2 hours. 4. Do not drive for 24 hours. General instructions  Take over-the-counter and prescription medicines only as told by your health care provider.  Keep all follow-up visits as told by your health care provider. This is important.  DRINK PLENTY OF FLUIDS FOR THE NEXT 2-3 DAYS. Contact a health care provider if you have:  A fever or chills.  You have redness, swelling, or pain around your insertion site. Get help right away if:  The catheter insertion area swells very fast.  You pass out.  You suddenly start to sweat or your skin gets clammy.  The  catheter insertion area is bleeding, and the bleeding does not stop when you hold steady pressure on the area.  The area near or just beyond the catheter insertion site becomes pale, cool, tingly, or numb. These symptoms may represent a serious problem that is an emergency. Do not wait to see if the symptoms will go away. Get medical help right away. Call your local emergency services (911 in the [place] Do not drive yourself to the hospital. Summary  After the procedure, it is common to have bruising that usually fades within 1-2 weeks.  Check your femoral site every day for signs of infection.  Do not lift, push or pull anything that is heavier than 10 lb for 5 days.  This information is not intended to replace advice given to you by your health care provider. Make sure you discuss any questions you have with your health care provider. Document Revised: 09/22/2017 Document Reviewed: 09/22/2017 Elsevier Patient Education  2020 [employer].

## 2020-07-12 NOTE — Telephone Encounter (Signed)
I called pt and advised of message below. Advised that you would be in touch in the next week or so to schedule. Do you have the blue sheet for this pt or does Dr. Sharol Given need to fill one out?

## 2020-07-12 NOTE — Telephone Encounter (Signed)
Please see below and let me know if there is anything that I need to do.

## 2020-07-12 NOTE — Telephone Encounter (Signed)
Patient called stating he is at [facility] and having a test and ultrasound done on his right leg. Patient said he was told he can get scheduled for surgery with Dr Sharol Given. Patient said Dr Donzetta Matters (Vascular Surgeon)  Was suppose to call Dr Sharol Given today. The number to contact patient is [phone]

## 2020-07-12 NOTE — Progress Notes (Signed)
Spoke with Rennis Harding RN/cath lab, pt fell 3 days ago and is unable to lay down, bruise on mid/lower right back, aprox 4"in diameter. Pt has not seen by  anyone post injury, pt requesting pain meds, pt has been  taking BC powders since injury. I did an ISTAT, hgb 21.8, hct 64. Last CBC was 5 months ago and hgb/hct was trending up then, Dr Carlis Abbott to be notified.

## 2020-07-12 NOTE — Progress Notes (Signed)
Up and walked and tolerated well; report received from Josph Macho

## 2020-07-12 NOTE — Telephone Encounter (Signed)
Call patient I spoke with the vascular surgeon and patient has sufficient circulation to proceed with the proposed surgery let them know we will have Harold Bailey schedule surgery.

## 2020-07-13 ENCOUNTER — Encounter (HOSPITAL_COMMUNITY): Payer: Self-pay | Admitting: Vascular Surgery

## 2020-07-17 ENCOUNTER — Other Ambulatory Visit: Payer: Self-pay | Admitting: *Deleted

## 2020-07-17 DIAGNOSIS — I739 Peripheral vascular disease, unspecified: Secondary | ICD-10-CM

## 2020-07-26 ENCOUNTER — Telehealth: Payer: Self-pay | Admitting: Orthopedic Surgery

## 2020-07-26 NOTE — Telephone Encounter (Signed)
Patient's wife Jocelyn Lamer called requesting a call back from Autumn. Vickie states she called and left messages about husband surgery date and would like Autumn F. To find out when surgery date is. Phone number is [phone].

## 2020-07-26 NOTE — Telephone Encounter (Signed)
Please see message below

## 2020-07-31 ENCOUNTER — Encounter: Payer: Self-pay | Admitting: Neurology

## 2020-07-31 ENCOUNTER — Ambulatory Visit (INDEPENDENT_AMBULATORY_CARE_PROVIDER_SITE_OTHER): Payer: Medicare HMO | Admitting: Neurology

## 2020-07-31 ENCOUNTER — Other Ambulatory Visit (INDEPENDENT_AMBULATORY_CARE_PROVIDER_SITE_OTHER): Payer: Medicare HMO

## 2020-07-31 ENCOUNTER — Other Ambulatory Visit: Payer: Self-pay

## 2020-07-31 VITALS — BP 124/86 | HR 105 | Ht 72.0 in | Wt 243.6 lb

## 2020-07-31 DIAGNOSIS — Z79899 Other long term (current) drug therapy: Secondary | ICD-10-CM | POA: Diagnosis not present

## 2020-07-31 DIAGNOSIS — G40209 Localization-related (focal) (partial) symptomatic epilepsy and epileptic syndromes with complex partial seizures, not intractable, without status epilepticus: Secondary | ICD-10-CM | POA: Diagnosis not present

## 2020-07-31 DIAGNOSIS — G0490 Encephalitis and encephalomyelitis, unspecified: Secondary | ICD-10-CM

## 2020-07-31 DIAGNOSIS — G4440 Drug-induced headache, not elsewhere classified, not intractable: Secondary | ICD-10-CM | POA: Diagnosis not present

## 2020-07-31 LAB — COMPREHENSIVE METABOLIC PANEL
ALT: 16 U/L (ref 0–53)
AST: 17 U/L (ref 0–37)
Albumin: 4.2 g/dL (ref 3.5–5.2)
Alkaline Phosphatase: 58 U/L (ref 39–117)
BUN: 9 mg/dL (ref 6–23)
CO2: 34 mEq/L — ABNORMAL HIGH (ref 19–32)
Calcium: 9.8 mg/dL (ref 8.4–10.5)
Chloride: 99 mEq/L (ref 96–112)
Creatinine, Ser: 0.86 mg/dL (ref 0.40–1.50)
GFR: 97.33 mL/min (ref >60.00)
Glucose, Bld: 85 mg/dL (ref 70–99)
Potassium: 5 mEq/L (ref 3.5–5.1)
Sodium: 138 mEq/L (ref 135–145)
Total Bilirubin: 0.3 mg/dL (ref 0.2–1.2)
Total Protein: 6.7 g/dL (ref 6.0–8.3)

## 2020-07-31 MED ORDER — AMITRIPTYLINE HCL 75 MG PO TABS: 75.0000 mg | ORAL_TABLET | Freq: Every day | ORAL | 5 refills | Status: DC
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
## 2020-07-31 NOTE — Patient Instructions (Signed)
1.  I have increased amitriptyline to 75mg  at bedtime.  This may cause dry mouth.  Therefore, discuss discontinuing wellbutrin with your PCP 2.  Continue Depakote ER 1000mg  daily and oxcarbazepine 450mg  twice daily 3.  Follow up in 6 months.

## 2020-08-01 ENCOUNTER — Telehealth: Payer: Self-pay

## 2020-08-01 LAB — CBC WITH DIFFERENTIAL/PLATELET
Basophils Absolute: 0 10*3/uL (ref 0.0–0.1)
Basophils Relative: 0.5 % (ref 0.0–3.0)
Eosinophils Absolute: 0.2 10*3/uL (ref 0.0–0.7)
Eosinophils Relative: 2.5 % (ref 0.0–5.0)
HCT: 57.8 % — ABNORMAL HIGH (ref 39.0–52.0)
Hemoglobin: 19.5 g/dL (ref 13.0–17.0)
Lymphocytes Relative: 34.2 % (ref 12.0–46.0)
Lymphs Abs: 2.6 10*3/uL (ref 0.7–4.0)
MCHC: 33.8 g/dL (ref 30.0–36.0)
MCV: 99.4 fl (ref 78.0–100.0)
Monocytes Absolute: 0.7 10*3/uL (ref 0.1–1.0)
Monocytes Relative: 9.7 % (ref 3.0–12.0)
Neutro Abs: 4.1 10*3/uL (ref 1.4–7.7)
Neutrophils Relative %: 53.1 % (ref 43.0–77.0)
Platelets: 172 10*3/uL (ref 150.0–400.0)
RBC: 5.82 Mil/uL — ABNORMAL HIGH (ref 4.22–5.81)
RDW: 15.9 % — ABNORMAL HIGH (ref 11.5–15.5)
WBC: 7.7 10*3/uL (ref 4.0–10.5)

## 2020-08-01 NOTE — Telephone Encounter (Signed)
Patient has polycythemia vera.  Stable compared to prior labs.

## 2020-08-01 NOTE — Telephone Encounter (Signed)
Penny from lab called critical lab results.  Pt HCT levels at 57.8 Hemoglobin 19.5

## 2020-08-04 ENCOUNTER — Other Ambulatory Visit: Payer: Self-pay

## 2020-08-07 ENCOUNTER — Other Ambulatory Visit (HOSPITAL_COMMUNITY)
Admission: RE | Admit: 2020-08-07 | Discharge: 2020-08-07 | Disposition: A | Discharge status: Home / Self Care | Payer: Medicare HMO | Source: Ambulatory Visit | Attending: Orthopedic Surgery | Admitting: Orthopedic Surgery

## 2020-08-07 ENCOUNTER — Other Ambulatory Visit: Payer: Self-pay | Admitting: Physician Assistant

## 2020-08-07 DIAGNOSIS — Z01812 Encounter for preprocedural laboratory examination: Secondary | ICD-10-CM | POA: Insufficient documentation

## 2020-08-07 DIAGNOSIS — Z20822 Contact with and (suspected) exposure to covid-19: Secondary | ICD-10-CM | POA: Insufficient documentation

## 2020-08-07 LAB — SARS CORONAVIRUS 2 (TAT 6-24 HRS): SARS Coronavirus 2: NEGATIVE

## 2020-08-08 ENCOUNTER — Encounter (HOSPITAL_COMMUNITY): Payer: Self-pay | Admitting: Orthopedic Surgery

## 2020-08-08 NOTE — Anesthesia Preprocedure Evaluation (Addendum)
Anesthesia Evaluation  Patient identified by MRN, date of birth, ID band Patient awake    Reviewed: Allergy & Precautions, NPO status , Patient's Chart, lab work & pertinent test results  Airway Mallampati: II  TM Distance: >3 FB     Dental   Pulmonary shortness of breath, pneumonia, COPD, Current Smoker and Patient abstained from smoking.,    breath sounds clear to auscultation       Cardiovascular hypertension, + dysrhythmias  Rhythm:Regular Rate:Normal     Neuro/Psych  Headaches, Seizures -,  Anxiety    GI/Hepatic Neg liver ROS, GERD  ,  Endo/Other  diabetes  Renal/GU negative Renal ROS     Musculoskeletal  (+) Arthritis ,   Abdominal   Peds  Hematology   Anesthesia Other Findings   Reproductive/Obstetrics                          Anesthesia Physical Anesthesia Plan  ASA: III  Anesthesia Plan: General   Post-op Pain Management:  Regional for Post-op pain   Induction: Intravenous  PONV Risk Score and Plan: Ondansetron, Dexamethasone and Midazolam  Airway Management Planned: LMA  Additional Equipment:   Intra-op Plan:   Post-operative Plan: Extubation in OR  Informed Consent: I have reviewed the patients History and Physical, chart, labs and discussed the procedure including the risks, benefits and alternatives for the proposed anesthesia with the patient or authorized representative who has indicated his/her understanding and acceptance.     Dental advisory given  Plan Discussed with: CRNA and Anesthesiologist  Anesthesia Plan Comments: (PAT note by Karoline Caldwell, PA-C: Patient was evaluated by cardiology at [facility] in 2018 for evaluation of palpitations and chest pain.  Initially underwent stress test was ultimately determined to be a false positive.  He underwent a cath 05/08/2017 showing nonobstructive disease, normal left ventricular systolic function.  He continues to follow  with his PCP Dr. Cindie Laroche for risk factor management.  Follows with neurology for history of epilepsy and headaches.  He also has a history of meningeal encephalitis resection with residual deficits including memory loss and peripheral vision loss.  He is maintained on Depakote, oxcarbazepine, gabapentin, amitriptyline, bupropion.  Last seen by his neurologist Dr. Tomi Likens 07/31/2020, no changes made to management, advised to follow-up in 6 months.  History of COPD.  In August 2018 he had a prolonged admission due to acute on chronic hypoxic respiratory failure secondary to MRSA tracheobronchitis.  Because of prolonged hospitalization and failed extubation attempt he did require tracheostomy but was ultimately weaned from mechanical ventilation and was on room air at discharge.  He also had a left upper extremity and left lower extremity DVT during hospitalization.  Pt reports using 4L supplemental O2 at night and as needed during the day (reportedly rarely uses during the day).  History of polysubstance abuse. Wife states no illicit drug use currently.   Current everyday smoker.  CMP and CBC from 07/31/2020 reviewed.  Hemoglobin elevated at 19.5, consistent with his history of polycythemia vera, appears to be near his baseline.  Labs otherwise unremarkable.  Will need day of surgery evaluation.  EKG 07/12/2020: NSR.  Rate 88.  Minimal voltage criteria for LVH, may be normal variant.  Cath 05/08/2017 (Care Everywhere): CONCLUSIONS: - Nonobstructive CAD - Negative immediate flow reserve circumflex marginal distal to an ostial 40-50 percent - Normal left ventricular systolic function, EF 71% - Falsely positive myocardial perfusion study  )   Anesthesia Quick Evaluation

## 2020-08-08 NOTE — Progress Notes (Signed)
PCP:  Lucia Gaskins, MD Cardiologist: followed by Dr. Cindie Laroche Neurology:  Metta Clines, MD  EKG:  07/12/20 CXR:05/27/17 1 view ECHO:  05/06/17 Stress Test:  05/06/17 - false positive Cardiac Cath:  05/08/17 CE  Fasting Blood Sugar- N/A.  Wife states she doesn't know he has diabetes Checks Blood Sugar_N/A__ times a day  ASA/Blood Thinners:  None  OSA/CPAP:  No Oxygen:  Per wife, 4L02 at night and rarely during the day  Covid test 11/15 negative  Anesthesia Review:  Yes, discussed with Jeneen Rinks, [place].    Patient denies shortness of breath, fever, cough, and chest pain at PAT appointment.  Patient verbalized understanding of instructions provided today at the PAT appointment.  Patient asked to review instructions at home and day of surgery.

## 2020-08-08 NOTE — Progress Notes (Addendum)
Anesthesia Chart Review: Same-day work-up  Patient was evaluated by cardiology at [facility] in 2018 for evaluation of palpitations and chest pain.  Initially underwent stress test was ultimately determined to be a false positive.  He underwent a cath 05/08/2017 showing nonobstructive disease, normal left ventricular systolic function.  He continues to follow with his PCP Dr. Cindie Laroche for risk factor management.  Follows with neurology for history of epilepsy and headaches.  He also has a history of meningeal encephalitis resection with residual deficits including memory loss and peripheral vision loss.  He is maintained on Depakote, oxcarbazepine, gabapentin, amitriptyline, bupropion.  Last seen by his neurologist Dr. Tomi Likens 07/31/2020, no changes made to management, advised to follow-up in 6 months.  History of COPD.  In August 2018 he had a prolonged admission due to acute on chronic hypoxic respiratory failure secondary to MRSA tracheobronchitis.  Because of prolonged hospitalization and failed extubation attempt he did require tracheostomy but was ultimately weaned from mechanical ventilation and was on room air at discharge.  He also had a left upper extremity and left lower extremity DVT during hospitalization.  Pt reports using 4L supplemental O2 at night and as needed during the day (reportedly rarely uses during the day).  History of polysubstance abuse. Wife states no illicit drug use currently.   Current everyday smoker.  CMP and CBC from 07/31/2020 reviewed.  Hemoglobin elevated at 19.5, consistent with his history of polycythemia vera, appears to be near his baseline.  Labs otherwise unremarkable.  Will need day of surgery evaluation.  EKG 07/12/2020: NSR.  Rate 88.  Minimal voltage criteria for LVH, may be normal variant.  Cath 05/08/2017 (Care Everywhere): CONCLUSIONS: - Nonobstructive CAD - Negative immediate flow reserve circumflex marginal distal to an ostial 40-50 percent - Normal  left ventricular systolic function, EF 07% - Falsely positive myocardial perfusion study   Wynonia Musty [facility] Short Stay Center/Anesthesiology Phone [phone] 08/08/2020 1:22 PM

## 2020-08-09 ENCOUNTER — Inpatient Hospital Stay (HOSPITAL_COMMUNITY)
Admission: AD | Admit: 2020-08-09 | Discharge: 2020-08-12 | DRG: 494 | Disposition: A | Discharge status: Skilled Nursing Facility | Payer: Medicare HMO | Attending: Orthopedic Surgery | Admitting: Orthopedic Surgery

## 2020-08-09 ENCOUNTER — Ambulatory Visit (HOSPITAL_COMMUNITY): Payer: Medicare HMO

## 2020-08-09 ENCOUNTER — Ambulatory Visit (HOSPITAL_COMMUNITY): Payer: Medicare HMO | Admitting: Physician Assistant

## 2020-08-09 ENCOUNTER — Encounter (HOSPITAL_COMMUNITY): Payer: Self-pay | Admitting: Orthopedic Surgery

## 2020-08-09 ENCOUNTER — Encounter (HOSPITAL_COMMUNITY)
Admission: AD | Disposition: A | Discharge status: Skilled Nursing Facility | Payer: Self-pay | Source: Home / Self Care | Attending: Orthopedic Surgery

## 2020-08-09 ENCOUNTER — Other Ambulatory Visit: Payer: Self-pay

## 2020-08-09 DIAGNOSIS — Z9981 Dependence on supplemental oxygen: Secondary | ICD-10-CM | POA: Diagnosis not present

## 2020-08-09 DIAGNOSIS — I10 Essential (primary) hypertension: Secondary | ICD-10-CM | POA: Diagnosis present

## 2020-08-09 DIAGNOSIS — F17210 Nicotine dependence, cigarettes, uncomplicated: Secondary | ICD-10-CM | POA: Diagnosis not present

## 2020-08-09 DIAGNOSIS — Z791 Long term (current) use of non-steroidal anti-inflammatories (NSAID): Secondary | ICD-10-CM | POA: Diagnosis not present

## 2020-08-09 DIAGNOSIS — I2510 Atherosclerotic heart disease of native coronary artery without angina pectoris: Secondary | ICD-10-CM | POA: Diagnosis present

## 2020-08-09 DIAGNOSIS — E119 Type 2 diabetes mellitus without complications: Secondary | ICD-10-CM | POA: Diagnosis present

## 2020-08-09 DIAGNOSIS — M19171 Post-traumatic osteoarthritis, right ankle and foot: Principal | ICD-10-CM

## 2020-08-09 DIAGNOSIS — Z7982 Long term (current) use of aspirin: Secondary | ICD-10-CM

## 2020-08-09 DIAGNOSIS — Z79891 Long term (current) use of opiate analgesic: Secondary | ICD-10-CM

## 2020-08-09 DIAGNOSIS — J449 Chronic obstructive pulmonary disease, unspecified: Secondary | ICD-10-CM | POA: Diagnosis not present

## 2020-08-09 DIAGNOSIS — Z7984 Long term (current) use of oral hypoglycemic drugs: Secondary | ICD-10-CM

## 2020-08-09 DIAGNOSIS — Z86718 Personal history of other venous thrombosis and embolism: Secondary | ICD-10-CM | POA: Diagnosis not present

## 2020-08-09 DIAGNOSIS — K219 Gastro-esophageal reflux disease without esophagitis: Secondary | ICD-10-CM | POA: Diagnosis not present

## 2020-08-09 DIAGNOSIS — Z79899 Other long term (current) drug therapy: Secondary | ICD-10-CM

## 2020-08-09 DIAGNOSIS — E785 Hyperlipidemia, unspecified: Secondary | ICD-10-CM | POA: Diagnosis not present

## 2020-08-09 DIAGNOSIS — Z7951 Long term (current) use of inhaled steroids: Secondary | ICD-10-CM | POA: Diagnosis not present

## 2020-08-09 DIAGNOSIS — M19071 Primary osteoarthritis, right ankle and foot: Secondary | ICD-10-CM | POA: Diagnosis present

## 2020-08-09 DIAGNOSIS — Z20822 Contact with and (suspected) exposure to covid-19: Secondary | ICD-10-CM | POA: Diagnosis present

## 2020-08-09 HISTORY — DX: Cardiac arrhythmia, unspecified: I49.9

## 2020-08-09 HISTORY — DX: Type 2 diabetes mellitus without complications: E11.9

## 2020-08-09 HISTORY — PX: ANKLE FUSION: SHX5718

## 2020-08-09 LAB — POCT I-STAT, CHEM 8
BUN: 14 mg/dL (ref 6–20)
Calcium, Ion: 1.25 mmol/L (ref 1.15–1.40)
Chloride: 99 mmol/L (ref 98–111)
Creatinine, Ser: 0.7 mg/dL (ref 0.61–1.24)
Glucose, Bld: 102 mg/dL — ABNORMAL HIGH (ref 70–99)
HCT: 61 % — ABNORMAL HIGH (ref 39.0–52.0)
Hemoglobin: 20.7 g/dL — ABNORMAL HIGH (ref 13.0–17.0)
Potassium: 4.6 mmol/L (ref 3.5–5.1)
Sodium: 140 mmol/L (ref 135–145)
TCO2: 29 mmol/L (ref 22–32)

## 2020-08-09 LAB — GLUCOSE, CAPILLARY
Glucose-Capillary: 122 mg/dL — ABNORMAL HIGH (ref 70–99)
Glucose-Capillary: 118 mg/dL — ABNORMAL HIGH (ref 70–99)

## 2020-08-09 SURGERY — ANKLE FUSION
Anesthesia: General | Site: Ankle | Laterality: Right

## 2020-08-09 MED ORDER — OXCARBAZEPINE 300 MG PO TABS
450.0000 mg | ORAL_TABLET | Freq: Two times a day (BID) | ORAL | Status: DC
  Administered 2020-08-09 – 2020-08-12 (×6): 450 mg via ORAL
  Filled 2020-08-09 (×7): qty 1

## 2020-08-09 MED ORDER — METOCLOPRAMIDE HCL 5 MG PO TABS: 5.0000 mg | ORAL_TABLET | Freq: Three times a day (TID) | ORAL | Status: DC | PRN

## 2020-08-09 MED ORDER — FENTANYL CITRATE (PF) 250 MCG/5ML IJ SOLN
INTRAMUSCULAR | Status: AC
  Filled 2020-08-09: qty 5

## 2020-08-09 MED ORDER — KETOROLAC TROMETHAMINE 15 MG/ML IJ SOLN
15.0000 mg | Freq: Four times a day (QID) | INTRAMUSCULAR | Status: AC
  Administered 2020-08-09 – 2020-08-10 (×4): 15 mg via INTRAVENOUS
  Filled 2020-08-09 (×4): qty 1

## 2020-08-09 MED ORDER — DOCUSATE SODIUM 100 MG PO CAPS
100.0000 mg | ORAL_CAPSULE | Freq: Two times a day (BID) | ORAL | Status: DC
  Administered 2020-08-09 – 2020-08-12 (×7): 100 mg via ORAL
  Filled 2020-08-09 (×7): qty 1

## 2020-08-09 MED ORDER — AMITRIPTYLINE HCL 50 MG PO TABS
75.0000 mg | ORAL_TABLET | Freq: Every day | ORAL | Status: DC
  Administered 2020-08-09 – 2020-08-11 (×3): 75 mg via ORAL
  Filled 2020-08-09 (×3): qty 2

## 2020-08-09 MED ORDER — MORPHINE SULFATE (PF) 2 MG/ML IV SOLN
0.5000 mg | INTRAVENOUS | Status: DC | PRN
  Administered 2020-08-09 – 2020-08-11 (×2): 0.5 mg via INTRAVENOUS
  Filled 2020-08-09 (×3): qty 1

## 2020-08-09 MED ORDER — UMECLIDINIUM BROMIDE 62.5 MCG/INH IN AEPB
1.0000 | INHALATION_SPRAY | Freq: Every day | RESPIRATORY_TRACT | Status: DC
  Administered 2020-08-10 – 2020-08-12 (×3): 1 via RESPIRATORY_TRACT
  Filled 2020-08-09: qty 7

## 2020-08-09 MED ORDER — ONDANSETRON HCL 4 MG/2ML IJ SOLN: 4.0000 mg | Freq: Four times a day (QID) | INTRAMUSCULAR | Status: DC | PRN

## 2020-08-09 MED ORDER — METOPROLOL SUCCINATE ER 50 MG PO TB24
50.0000 mg | ORAL_TABLET | Freq: Every day | ORAL | Status: DC
  Administered 2020-08-10 – 2020-08-12 (×3): 50 mg via ORAL
  Filled 2020-08-09 (×3): qty 1

## 2020-08-09 MED ORDER — METFORMIN HCL 500 MG PO TABS
500.0000 mg | ORAL_TABLET | Freq: Two times a day (BID) | ORAL | Status: DC
  Administered 2020-08-09 – 2020-08-12 (×6): 500 mg via ORAL
  Filled 2020-08-09 (×5): qty 1

## 2020-08-09 MED ORDER — PHENYLEPHRINE 40 MCG/ML (10ML) SYRINGE FOR IV PUSH (FOR BLOOD PRESSURE SUPPORT)
PREFILLED_SYRINGE | INTRAVENOUS | Status: DC | PRN
  Administered 2020-08-09 (×11): 80 ug via INTRAVENOUS

## 2020-08-09 MED ORDER — CEFAZOLIN SODIUM-DEXTROSE 2-4 GM/100ML-% IV SOLN
2.0000 g | Freq: Four times a day (QID) | INTRAVENOUS | Status: AC
  Administered 2020-08-09 – 2020-08-10 (×3): 2 g via INTRAVENOUS
  Filled 2020-08-09 (×3): qty 100

## 2020-08-09 MED ORDER — LIDOCAINE 2% (20 MG/ML) 5 ML SYRINGE
INTRAMUSCULAR | Status: DC | PRN
  Administered 2020-08-09: 60 mg via INTRAVENOUS

## 2020-08-09 MED ORDER — ALBUTEROL SULFATE HFA 108 (90 BASE) MCG/ACT IN AERS: 2.0000 | INHALATION_SPRAY | Freq: Four times a day (QID) | RESPIRATORY_TRACT | Status: DC | PRN

## 2020-08-09 MED ORDER — ASPIRIN EC 81 MG PO TBEC
81.0000 mg | DELAYED_RELEASE_TABLET | Freq: Every day | ORAL | Status: DC
  Administered 2020-08-09 – 2020-08-12 (×4): 81 mg via ORAL
  Filled 2020-08-09 (×4): qty 1

## 2020-08-09 MED ORDER — MIDAZOLAM HCL 5 MG/5ML IJ SOLN
INTRAMUSCULAR | Status: DC | PRN
  Administered 2020-08-09: 2 mg via INTRAVENOUS

## 2020-08-09 MED ORDER — CEFAZOLIN SODIUM-DEXTROSE 2-4 GM/100ML-% IV SOLN
2.0000 g | INTRAVENOUS | Status: AC
  Administered 2020-08-09: 2 g via INTRAVENOUS
  Filled 2020-08-09: qty 100

## 2020-08-09 MED ORDER — ROSUVASTATIN CALCIUM 5 MG PO TABS
10.0000 mg | ORAL_TABLET | Freq: Every day | ORAL | Status: DC
  Administered 2020-08-09 – 2020-08-11 (×3): 10 mg via ORAL
  Filled 2020-08-09 (×3): qty 2

## 2020-08-09 MED ORDER — NAPHAZOLINE-PHENIRAMINE 0.025-0.3 % OP SOLN: 1.0000 [drp] | Freq: Three times a day (TID) | OPHTHALMIC | Status: DC | PRN

## 2020-08-09 MED ORDER — OXYCODONE-ACETAMINOPHEN 5-325 MG PO TABS
1.0000 | ORAL_TABLET | ORAL | Status: DC | PRN
  Administered 2020-08-09 – 2020-08-12 (×13): 1 via ORAL
  Filled 2020-08-09 (×14): qty 1

## 2020-08-09 MED ORDER — GABAPENTIN 300 MG PO CAPS
300.0000 mg | ORAL_CAPSULE | Freq: Two times a day (BID) | ORAL | Status: DC
  Administered 2020-08-09 – 2020-08-12 (×6): 300 mg via ORAL
  Filled 2020-08-09 (×6): qty 1

## 2020-08-09 MED ORDER — METOCLOPRAMIDE HCL 5 MG/ML IJ SOLN: 5.0000 mg | Freq: Three times a day (TID) | INTRAMUSCULAR | Status: DC | PRN

## 2020-08-09 MED ORDER — MIDAZOLAM HCL 2 MG/2ML IJ SOLN
INTRAMUSCULAR | Status: AC
  Filled 2020-08-09: qty 2

## 2020-08-09 MED ORDER — ACETAMINOPHEN 325 MG PO TABS: 325.0000 mg | ORAL_TABLET | Freq: Four times a day (QID) | ORAL | Status: DC | PRN

## 2020-08-09 MED ORDER — ONDANSETRON HCL 4 MG/2ML IJ SOLN
INTRAMUSCULAR | Status: AC
  Filled 2020-08-09: qty 2

## 2020-08-09 MED ORDER — 0.9 % SODIUM CHLORIDE (POUR BTL) OPTIME
TOPICAL | Status: DC | PRN
  Administered 2020-08-09: 1000 mL

## 2020-08-09 MED ORDER — BUPROPION HCL ER (SR) 150 MG PO TB12
150.0000 mg | ORAL_TABLET | Freq: Two times a day (BID) | ORAL | Status: DC
  Administered 2020-08-09 – 2020-08-12 (×6): 150 mg via ORAL
  Filled 2020-08-09 (×6): qty 1

## 2020-08-09 MED ORDER — FLUTICASONE-UMECLIDIN-VILANT 100-62.5-25 MCG/INH IN AEPB: 1.0000 | INHALATION_SPRAY | Freq: Every day | RESPIRATORY_TRACT | Status: DC

## 2020-08-09 MED ORDER — PHENYLEPHRINE HCL-NACL 10-0.9 MG/250ML-% IV SOLN
INTRAVENOUS | Status: DC | PRN
  Administered 2020-08-09: 40 ug/min via INTRAVENOUS

## 2020-08-09 MED ORDER — ONDANSETRON HCL 4 MG PO TABS: 4.0000 mg | ORAL_TABLET | Freq: Four times a day (QID) | ORAL | Status: DC | PRN

## 2020-08-09 MED ORDER — SODIUM CHLORIDE 0.9 % IV SOLN: INTRAVENOUS | Status: DC

## 2020-08-09 MED ORDER — ONDANSETRON HCL 4 MG/2ML IJ SOLN
INTRAMUSCULAR | Status: DC | PRN
  Administered 2020-08-09: 4 mg via INTRAVENOUS

## 2020-08-09 MED ORDER — ALPRAZOLAM 0.5 MG PO TABS
1.0000 mg | ORAL_TABLET | Freq: Three times a day (TID) | ORAL | Status: DC | PRN
  Administered 2020-08-10 – 2020-08-11 (×5): 1 mg via ORAL
  Filled 2020-08-09 (×5): qty 2

## 2020-08-09 MED ORDER — FLUTICASONE FUROATE-VILANTEROL 100-25 MCG/INH IN AEPB
1.0000 | INHALATION_SPRAY | Freq: Every day | RESPIRATORY_TRACT | Status: DC
  Administered 2020-08-10 – 2020-08-12 (×3): 1 via RESPIRATORY_TRACT
  Filled 2020-08-09: qty 28

## 2020-08-09 MED ORDER — PHENYLEPHRINE 40 MCG/ML (10ML) SYRINGE FOR IV PUSH (FOR BLOOD PRESSURE SUPPORT)
PREFILLED_SYRINGE | INTRAVENOUS | Status: AC
  Filled 2020-08-09: qty 20

## 2020-08-09 MED ORDER — LACTATED RINGERS IV SOLN: INTRAVENOUS | Status: DC | PRN

## 2020-08-09 MED ORDER — DIVALPROEX SODIUM ER 500 MG PO TB24
1000.0000 mg | ORAL_TABLET | Freq: Every day | ORAL | Status: DC
  Administered 2020-08-10 – 2020-08-12 (×3): 1000 mg via ORAL
  Filled 2020-08-09 (×3): qty 2

## 2020-08-09 MED ORDER — FENTANYL CITRATE (PF) 100 MCG/2ML IJ SOLN
25.0000 ug | INTRAMUSCULAR | Status: DC | PRN
  Administered 2020-08-09 (×2): 25 ug via INTRAVENOUS

## 2020-08-09 MED ORDER — ALLOPURINOL 300 MG PO TABS
300.0000 mg | ORAL_TABLET | Freq: Every day | ORAL | Status: DC
  Administered 2020-08-10 – 2020-08-12 (×3): 300 mg via ORAL
  Filled 2020-08-09 (×3): qty 1

## 2020-08-09 MED ORDER — PROPOFOL 10 MG/ML IV BOLUS
INTRAVENOUS | Status: DC | PRN
  Administered 2020-08-09: 50 mg via INTRAVENOUS
  Administered 2020-08-09: 150 mg via INTRAVENOUS

## 2020-08-09 MED ORDER — FENTANYL CITRATE (PF) 100 MCG/2ML IJ SOLN
INTRAMUSCULAR | Status: AC
  Filled 2020-08-09: qty 2

## 2020-08-09 MED ORDER — PROPOFOL 10 MG/ML IV BOLUS
INTRAVENOUS | Status: AC
  Filled 2020-08-09: qty 40

## 2020-08-09 MED ORDER — FENTANYL CITRATE (PF) 100 MCG/2ML IJ SOLN
INTRAMUSCULAR | Status: DC | PRN
  Administered 2020-08-09: 100 ug via INTRAVENOUS
  Administered 2020-08-09: 50 ug via INTRAVENOUS

## 2020-08-09 SURGICAL SUPPLY — 60 items
BANDAGE ESMARK 6X9 LF (GAUZE/BANDAGES/DRESSINGS) ×1 IMPLANT
BIT DRILL CANNULATED 4.6 (BIT) ×2 IMPLANT
BIT DRILL LONG 3.1X160 (DRILL) IMPLANT
BIT DRILL SOLID LONG 2.8X160 (DRILL) IMPLANT
BLADE AVERAGE 25MMX9MM (BLADE) ×1
BLADE AVERAGE 25X9 (BLADE) ×2 IMPLANT
BLADE SAW SGTL MED 73X18.5 STR (BLADE) ×5 IMPLANT
BLADE SURG 10 STRL SS (BLADE) IMPLANT
BNDG CMPR 9X6 STRL LF SNTH (GAUZE/BANDAGES/DRESSINGS) ×1
BNDG COHESIVE 4X5 TAN STRL (GAUZE/BANDAGES/DRESSINGS) ×3 IMPLANT
BNDG ESMARK 6X9 LF (GAUZE/BANDAGES/DRESSINGS) ×3
BNDG GAUZE ELAST 4 BULKY (GAUZE/BANDAGES/DRESSINGS) ×4 IMPLANT
COVER MAYO STAND STRL (DRAPES) IMPLANT
COVER SURGICAL LIGHT HANDLE (MISCELLANEOUS) ×6 IMPLANT
COVER WAND RF STERILE (DRAPES) ×3 IMPLANT
DRAPE DERMATAC (DRAPES) ×6 IMPLANT
DRAPE OEC MINIVIEW 54X84 (DRAPES) IMPLANT
DRAPE U-SHAPE 47X51 STRL (DRAPES) ×3 IMPLANT
DRILL LONG 3.1X160 (DRILL) ×3
DRILL SOLID LONG 2.8X160 (DRILL) ×3
DRSG ADAPTIC 3X8 NADH LF (GAUZE/BANDAGES/DRESSINGS) ×3 IMPLANT
DURAPREP 26ML APPLICATOR (WOUND CARE) ×3 IMPLANT
ELECT REM PT RETURN 9FT ADLT (ELECTROSURGICAL) ×3
ELECTRODE REM PT RTRN 9FT ADLT (ELECTROSURGICAL) ×1 IMPLANT
GAUZE SPONGE 4X4 12PLY STRL (GAUZE/BANDAGES/DRESSINGS) ×3 IMPLANT
GAUZE SPONGE 4X4 12PLY STRL LF (GAUZE/BANDAGES/DRESSINGS) ×2 IMPLANT
GLOVE BIOGEL PI IND STRL 9 (GLOVE) ×1 IMPLANT
GLOVE BIOGEL PI INDICATOR 9 (GLOVE) ×2
GLOVE SURG ORTHO 9.0 STRL STRW (GLOVE) ×3 IMPLANT
GOWN STRL REUS W/ TWL LRG LVL3 (GOWN DISPOSABLE) ×1 IMPLANT
GOWN STRL REUS W/ TWL XL LVL3 (GOWN DISPOSABLE) ×1 IMPLANT
GOWN STRL REUS W/TWL LRG LVL3 (GOWN DISPOSABLE) ×3
GOWN STRL REUS W/TWL XL LVL3 (GOWN DISPOSABLE) ×3
K-WIRE SINGLE TROCAR 2.3X230 (WIRE) ×3
KIT BASIN OR (CUSTOM PROCEDURE TRAY) ×3 IMPLANT
KIT TURNOVER KIT B (KITS) ×3 IMPLANT
KWIRE SINGLE TROCAR 2.3X230 (WIRE) IMPLANT
NS IRRIG 1000ML POUR BTL (IV SOLUTION) ×3 IMPLANT
PACK ORTHO EXTREMITY (CUSTOM PROCEDURE TRAY) ×3 IMPLANT
PAD ABD 8X10 STRL (GAUZE/BANDAGES/DRESSINGS) ×2 IMPLANT
PAD ARMBOARD 7.5X6 YLW CONV (MISCELLANEOUS) ×6 IMPLANT
PLATE ANT TT PRIM RT (Plate) ×2 IMPLANT
PREVENA RESTOR AXIOFORM 29X28 (GAUZE/BANDAGES/DRESSINGS) ×2 IMPLANT
SCREW CANN HDLS 7X70 (Screw) ×2 IMPLANT
SCREW LOCK PLATE R3 4.2X24 (Screw) ×2 IMPLANT
SCREW LOCK PLATE R3 4.2X26 (Screw) ×6 IMPLANT
SCREW LOCK PLATE SB 4.5X28 (Screw) ×2 IMPLANT
SCREW LOCK PLATE SB 4.5X30 (Screw) ×2 IMPLANT
SCREW LOCK PLT 26X4.2X GRLL (Screw) IMPLANT
SCREW NONLOCK PLATE R3 4.2X24 (Screw) ×2 IMPLANT
SPONGE LAP 18X18 RF (DISPOSABLE) ×3 IMPLANT
SUCTION FRAZIER HANDLE 10FR (MISCELLANEOUS) ×3
SUCTION TUBE FRAZIER 10FR DISP (MISCELLANEOUS) ×1 IMPLANT
SUT ETHILON 2 0 PSLX (SUTURE) ×9 IMPLANT
SYR BULB IRRIG 60ML STRL (SYRINGE) ×2 IMPLANT
TOWEL GREEN STERILE (TOWEL DISPOSABLE) ×3 IMPLANT
TOWEL GREEN STERILE FF (TOWEL DISPOSABLE) ×3 IMPLANT
TUBE CONNECTING 12'X1/4 (SUCTIONS) ×1
TUBE CONNECTING 12X1/4 (SUCTIONS) ×2 IMPLANT
WATER STERILE IRR 1000ML POUR (IV SOLUTION) ×3 IMPLANT

## 2020-08-09 NOTE — Anesthesia Procedure Notes (Signed)
Anesthesia Regional Block: Popliteal block   Pre-Anesthetic Checklist: ,, timeout performed, Correct Patient, Correct Site, Correct Laterality, Correct Procedure, Correct Position, site marked, Risks and benefits discussed,  Surgical consent,  Pre-op evaluation,  At surgeon's request and post-op pain management  Laterality: Right  Prep: chloraprep       Needles:  Injection technique: Single-shot  Needle Type: Stimulator Needle - 80          Additional Needles:   Procedures: Doppler guided,,,, ultrasound used (permanent image in chart),,,,   Nerve Stimulator or Paresthesia:  Response: 0.5 mA,   Additional Responses:   Narrative:  Start time: 08/09/2020 8:05 AM End time: 08/09/2020 8:20 AM Injection made incrementally with aspirations every 5 mL.  Performed by: Personally  Anesthesiologist: Belinda Block, MD

## 2020-08-09 NOTE — Progress Notes (Signed)
Orthopedic Tech Progress Note Patient Details:  Harold Bailey Jan 01, 1966 [id number]  Ortho Devices Type of Ortho Device: CAM walker Ortho Device/Splint Location: RLE Ortho Device/Splint Interventions: Ordered, Application, Adjustment   Post Interventions Patient Tolerated: Well Instructions Provided: Care of device   Janit Pagan 08/09/2020, 12:06 PM

## 2020-08-09 NOTE — H&P (Signed)
Harold Bailey is an 54 y.o. male.   Chief Complaint: Right Ankle Pain HPI: This is a 54 year old gentleman who is status post left ankle arthrodesis.  He also has a history of an open reduction internal fixation in 2014 of a right distal fibula fracture.  He is having induced increasing difficulties with this right ankle.  It makes it difficult for him to stand on his foot for any length of time.  It is bothering him so much that he feels he is over consuming alcohol at night for the pain.  Past Medical History:  Diagnosis Date  . Anxiety   . Arthritis    Left ankle, hand  . COPD (chronic obstructive pulmonary disease) ([place])   . COPD (chronic obstructive pulmonary disease) ([place])   . Diabetes mellitus without complication ([place])   . Drug abuse ([place])   . DVT (deep venous thrombosis) ([facility])    Bilateral legs  . Dysrhythmia    SVT  . Gait disturbance   . GERD (gastroesophageal reflux disease)   . 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  Marland Kitchen History of ankle surgery   . History of brain tumor   . Hyperlipidemia   . Hypertension   . 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 Memory loss   . On home oxygen therapy 08/26/2018   at night while lying down  . Peripheral vision loss    Left due to brain surgery  . Pneumonia   . Polycythemia   . Seizures ([place]) 11/19/2013   none since 2016  . Shortness of breath    Hx of smoking.  Marland Kitchen SVT (supraventricular tachycardia) ([place])   . Wears dentures   . Wears glasses     Past Surgical History:  Procedure Laterality Date  . ABDOMINAL AORTOGRAM W/LOWER EXTREMITY N/A 05/30/2017   Procedure: ABDOMINAL AORTOGRAM W/LOWER EXTREMITY;  Surgeon: Elam Dutch, MD;  Location: [facility];  Service: Cardiovascular;  Laterality: N/A;  . ABDOMINAL AORTOGRAM W/LOWER EXTREMITY N/A 07/12/2020   Procedure: ABDOMINAL AORTOGRAM W/LOWER EXTREMITY;  Surgeon: [name], MD;   Location: [facility];  Service: Cardiovascular;  Laterality: N/A;  . ANKLE ARTHROSCOPY  01/17/2012   Procedure: ANKLE ARTHROSCOPY;  Surgeon: [name], MD;  Location: [facility];  Service: Orthopedics;  Laterality: Left;  . ANKLE ARTHROSCOPY WITH FUSION Left 10/02/2016   Procedure: Left Posterior Subtalar Arthrodesis Arthroscopy;  Surgeon: [name], MD;  Location: [facility];  Service: Orthopedics;  Laterality: Left;  . ANKLE FUSION Left 11/2012   Dr Sharol Given  . ANKLE SURGERY  2003   Left  . BRAIN SURGERY  2009   Biopsy  . COLONOSCOPY WITH PROPOFOL N/A 03/11/2019   Procedure: COLONOSCOPY WITH PROPOFOL;  Surgeon: Mauri Pole, MD;  Location: [facility];  Service: Endoscopy;  Laterality: N/A;  . FRACTURE SURGERY  2003   Left ankle  . HARDWARE REMOVAL Left 12/08/2012   Procedure: HARDWARE REMOVAL;  Surgeon: [name], MD;  Location: [facility];  Service: Orthopedics;  Laterality: Left;  Removal Deep Hardware, Take Down Non-Union, Revision Internal Fixation Left Ankle  . I & D EXTREMITY Left 01/01/2013   Procedure: IRRIGATION AND DEBRIDEMENT EXTREMITY;  Surgeon: [name], MD;  Location: [facility];  Service: Orthopedics;  Laterality: Left;  Irrigation, Debridement and Placement Antibiotic Beads Left Ankle  . INNER EAR SURGERY    .  OPEN REDUCTION INTERNAL FIXATION (ORIF) SCAPHOID WITH DISTAL RADIUS GRAFT Right 02/01/2013   Procedure: OPEN REDUCTION INTERNAL FIXATION (ORIF) RIGHT SCAPHOID FRACTURE WITH DISTAL RADIUS GRAFT;  Surgeon: Schuyler Amor, MD;  Location: [facility];  Service: Orthopedics;  Laterality: Right;  . ORIF ANKLE FRACTURE Left 12/08/2012   Procedure: OPEN REDUCTION INTERNAL FIXATION (ORIF) ANKLE FRACTURE;  Surgeon: [name], MD;  Location: [facility];  Service: Orthopedics;  Laterality: Left;  Removal Deep Hardware, Take Down Non-Union, Revision Internal Fixation Left Ankle  . ORIF ANKLE FRACTURE Right 09/03/2013   Procedure: OPEN REDUCTION INTERNAL FIXATION (ORIF) ANKLE  FRACTURE;  Surgeon: [name], MD;  Location: [facility];  Service: Orthopedics;  Laterality: Right;  Open Reduction Internal Fixation Right Fibula  . POLYPECTOMY  03/11/2019   Procedure: POLYPECTOMY;  Surgeon: Mauri [place], MD;  Location: [facility];  Service: Endoscopy;;  . SCAPHOID FRACTURE SURGERY  02/04/2013  . SHOULDER ARTHROSCOPY  2008   Left  . TYMPANOSTOMY TUBE PLACEMENT  1993    Family History  Problem Relation Age of Onset  . Hypertension Mother   . Bronchitis Mother   . Cancer Maternal Aunt   . Cancer Maternal Grandmother   . Healthy Sister   . Healthy Brother   . Stroke Neg Hx   . Diabetes Neg Hx    Social History:  reports that he has been smoking cigarettes. He has a 40.00 pack-year smoking history. He has never used smokeless tobacco. He reports that he does not drink alcohol and does not use drugs.  Allergies:  Allergies  Allergen Reactions  . Hydrocodone Rash    Can take percocet  . Lyrica [Pregabalin] Swelling    Swelling and itching in hands  . Other     No narcotics due to history of opioid dependence    Medications Prior to Admission  Medication Sig Dispense Refill  . albuterol (PROVENTIL HFA;VENTOLIN HFA) 108 (90 Base) MCG/ACT inhaler Inhale 2 puffs into the lungs every 6 (six) hours as needed for wheezing or shortness of breath. 1 Inhaler 6  . allopurinol (ZYLOPRIM) 300 MG tablet Take 300 mg by mouth daily.    Marland Kitchen ALPRAZolam (XANAX) 1 MG tablet Take 1 mg by mouth 3 (three) times daily as needed for anxiety.     Marland Kitchen amitriptyline (ELAVIL) 75 MG tablet Take 1 tablet (75 mg total) by mouth at bedtime. 30 tablet 5  . Ascorbic Acid (VITAMIN C PO) Take 1 tablet by mouth daily.    Marland Kitchen aspirin EC 81 MG tablet Take 81 mg by mouth daily.    . Aspirin-Caffeine (BC FAST PAIN RELIEF PO) Take 1 packet by mouth 4 (four) times daily as needed (pain).    Marland Kitchen buPROPion (WELLBUTRIN SR) 150 MG 12 hr tablet Take 150 mg by mouth 2 (two) times daily.    . divalproex  (DEPAKOTE ER) 500 MG 24 hr tablet TAKE 2 TABLETS(1000 MG) BY MOUTH DAILY (Patient taking differently: Take 1,000 mg by mouth daily. ) 180 tablet 3  . gabapentin (NEURONTIN) 300 MG capsule TAKE 1 CAPSULE(300 MG) BY MOUTH TWICE DAILY (Patient taking differently: Take 300 mg by mouth 2 (two) times daily. ) 60 capsule 5  . meloxicam (MOBIC) 7.5 MG tablet TAKE 1 TABLET(7.5 MG) BY MOUTH DAILY (Patient taking differently: Take 7.5 mg by mouth daily. ) 90 tablet 0  . metFORMIN (GLUCOPHAGE) 500 MG tablet Take 500 mg by mouth 2 (two) times daily.    . metoprolol succinate (TOPROL-XL) 50  MG 24 hr tablet Take 50 mg by mouth daily.    . Multiple Vitamins-Minerals (MULTIVITAMIN WITH MINERALS) tablet Take 2 tablets by mouth daily.     . nabumetone (RELAFEN) 750 MG tablet Take 750 mg by mouth daily.    . Naphazoline-Pheniramine (OPCON-A) 0.027-0.315 % SOLN Place 1 drop into both eyes every 8 (eight) hours as needed (red, irritated eyes).    . neomycin-polymyxin-hydrocortisone (CORTISPORIN) 3.5-10000-1 OTIC suspension Place 2 drops into the left ear 2 (two) times daily as needed (ear pain).     . Oxcarbazepine (TRILEPTAL) 300 MG tablet Take 1.5 tablets (450 mg total) by mouth 2 (two) times daily. 90 tablet 5  . oxyCODONE (OXY IR/ROXICODONE) 5 MG immediate release tablet Take 5 mg by mouth 4 (four) times daily.    . rosuvastatin (CRESTOR) 10 MG tablet Take 10 mg by mouth daily at 6 PM.    . TRELEGY ELLIPTA 100-62.5-25 MCG/INH AEPB INHALE 1 PUFF INTO THE LUNGS DAILY (Patient taking differently: Take 1 puff by mouth daily. ) 60 each 5  . VITAMIN D PO Take 1 capsule by mouth daily.    . VOLTAREN 1 % GEL APPLY 2 TO 4 GRAMS EXTERNALLY TO THE AFFECTED AREA TWICE DAILY (Patient taking differently: Apply 4 g topically 4 (four) times daily as needed (pain). ) 500 g 0  . OXYGEN Inhale into the lungs. At home oxygen      Results for orders placed or performed during the hospital encounter of 08/07/20 (from the past 48 hour(s))   SARS CORONAVIRUS 2 (TAT 6-24 HRS) Nasopharyngeal Nasopharyngeal Swab     Status: None   Collection Time: 08/07/20  3:22 PM   Specimen: Nasopharyngeal Swab  Result Value Ref Range   SARS Coronavirus 2 NEGATIVE NEGATIVE    Comment: (NOTE) SARS-CoV-2 target nucleic acids are NOT DETECTED.  The SARS-CoV-2 RNA is generally detectable in upper and lower respiratory specimens during the acute phase of infection. Negative results do not preclude SARS-CoV-2 infection, do not rule out co-infections with other pathogens, and should not be used as the sole basis for treatment or other patient management decisions. Negative results must be combined with clinical observations, patient history, and epidemiological information. The expected result is Negative.  Fact Sheet for Patients: SugarRoll.be  Fact Sheet for Healthcare Providers: https://www.woods-mathews.com/  This test is not yet approved or cleared by the [place] FDA and  has been authorized for detection and/or diagnosis of SARS-CoV-2 by FDA under an Emergency Use Authorization (EUA). This EUA will remain  in effect (meaning this test can be used) for the duration of the COVID-19 declaration under Se ction 564(b)(1) of the Act, 21 U.S.C. section 360bbb-3(b)(1), unless the authorization is terminated or revoked sooner.  Performed at [facility], [place] [address] [place], [place] [address]    No results found.  Review of Systems  All other systems reviewed and are negative.   There were no vitals taken for this visit. Physical Exam  Patient is alert, oriented, no adenopathy, well-dressed, normal affect, normal respiratory effort. Focused examination of his right ankle he is tender around the ankle joint.  Limited range of motion.  He has 5 phasic posterior tibial pulses.  Difficult to find pulses even with Doppler on the  dorsalis pedis.  Pulses were identified with Doppler but  very diminished no cellulitis or signs of infectionheart RRR Lungs clear Assessment/Plan 1. H/O ankle fusion     Plan: Right ankle traumatic arthritis.  Patient  will be seen by vein and vascular for repeat ABIs as his dorsalis pedis pulse was barely audible by Doppler.  Once the recommendations made we will go forward and go forward with a right ankle arthrodesis.  The risks and recovery were reviewed.  He would like to go to skilled nursing.  We also discussed smoking cessation which he is willing to do for the perioperative.    Bevely Palmer Shanyce Daris, PA 08/09/2020, 6:34 AM

## 2020-08-09 NOTE — Transfer of Care (Signed)
Immediate Anesthesia Transfer of Care Note  Patient: Harold Bailey  Procedure(s) Performed: RIGHT ANKLE FUSION (Right Ankle)  Patient Location: PACU  Anesthesia Type:General and Regional  Level of Consciousness: awake and patient cooperative  Airway & Oxygen Therapy: Patient Spontanous Breathing and Patient connected to face mask oxygen  Post-op Assessment: Report given to RN and Post -op Vital signs reviewed and stable  Post vital signs: Reviewed and stable  Last Vitals:  Vitals Value Taken Time  BP 100/65 08/09/20 0958  Temp    Pulse 96 08/09/20 0958  Resp 13 08/09/20 0958  SpO2 94 % 08/09/20 0958  Vitals shown include unvalidated device data.  Last Pain:  Vitals:   08/09/20 0754  PainSc: 8       Patients Stated Pain Goal: 2 (10/71/25 2479)  Complications: No complications documented.

## 2020-08-09 NOTE — Interval H&P Note (Signed)
History and Physical Interval Note:  08/09/2020 6:54 AM  Harold Bailey  has presented today for surgery, with the diagnosis of Osteoarthritis Right Ankle.  The various methods of treatment have been discussed with the patient and family. After consideration of risks, benefits and other options for treatment, the patient has consented to  Procedure(s): RIGHT ANKLE FUSION (Right) as a surgical intervention.  The patient's history has been reviewed, patient examined, no change in status, stable for surgery.  I have reviewed the patient's chart and labs.  Questions were answered to the patient's satisfaction.     [name]

## 2020-08-09 NOTE — Plan of Care (Signed)

## 2020-08-09 NOTE — Anesthesia Postprocedure Evaluation (Signed)
Anesthesia Post Note  Patient: Harold Bailey  Procedure(s) Performed: RIGHT ANKLE FUSION (Right Ankle)     Patient location during evaluation: PACU Anesthesia Type: General Level of consciousness: awake Pain management: pain level controlled Vital Signs Assessment: post-procedure vital signs reviewed and stable Respiratory status: spontaneous breathing Cardiovascular status: stable Postop Assessment: no apparent nausea or vomiting Anesthetic complications: no   No complications documented.  Last Vitals:  Vitals:   08/09/20 1027 08/09/20 1032  BP: 111/69   Pulse: 92 96  Resp: 10 17  Temp:    SpO2: 92% 92%    Last Pain:  Vitals:   08/09/20 1021  PainSc: 5                  Tayva Easterday

## 2020-08-09 NOTE — Progress Notes (Signed)
Patient ID: Harold Bailey, male   DOB: 26-May-1966, 54 y.o.   MRN: [id number] During surgery it was difficult to maintain oxygen saturations due to his poor pulmonary status secondary to chronic tobacco use.  On 100% oxygen patient's oxygen saturations during surgery was only 92%.  CO2 was 70%. Patient will need inpatient admission to ensure pulmonary  function is stable for discharge.

## 2020-08-09 NOTE — Op Note (Signed)
08/09/2020  9:58 AM  PATIENT:  [name]    PRE-OPERATIVE DIAGNOSIS:  Osteoarthritis Right Ankle  POST-OPERATIVE DIAGNOSIS:  Same  PROCEDURE:  RIGHT ANKLE FUSION  SURGEON:  [name], MD  PHYSICIAN ASSISTANT:None ANESTHESIA:   General  PREOPERATIVE INDICATIONS:  DOMANIC MATUSEK is a  54 y.o. male with a diagnosis of Osteoarthritis Right Ankle who failed conservative measures and elected for surgical management.    The risks benefits and alternatives were discussed with the patient preoperatively including but not limited to the risks of infection, bleeding, nerve injury, cardiopulmonary complications, the need for revision surgery, among others, and the patient was willing to proceed.  OPERATIVE IMPLANTS: Biomet anterior fusion plate with axial form wound VAC  @ENCIMAGES @  OPERATIVE FINDINGS: C-arm fluoroscopy verified stable alignment of the ankle fusion both AP lateral and oblique planes  OPERATIVE PROCEDURE: Patient was brought the operating room after undergoing a regional block she then underwent a general anesthetic.  After adequate levels anesthesia were obtained patient's right lower extremity was prepped using DuraPrep draped into a sterile field a timeout was called.  An anterior incision was made between the interval of the EHL and anterior tibial tendon blunt dissection was carried down to bone and subperiosteal dissection was used to expose the tibial talar joint retractors were placed medially and laterally an oscillating saw was used to make perpendicular cuts to the long axis of the tibia of both the tibia and talus with the ankle at neutral dorsiflexion.  A osteotome and curette were used to further debride the joint the joint was reduced the plate was provisionally placed with a locking and compression screw distally on the talus and a compression screw placed proximally.  The screw was secured with compression with the proximal hole using the lateral  guide a guidewire was inserted from the tibiotalar joint obliquely and a 70 mm headless screw was placed to provide further compression.  2 additional locking screws were placed into the talus with a total of 4 screws in the talus and a compression screw was placed in the tibia.  See arthroscopy verified alignment the wound was irrigated with normal saline throughout the case hemostasis was obtained the incision was closed using 2-0 nylon a Prevena wound VAC was applied this had a good suction fit this was overwrapped with Covan patient was extubated taken the PACU in stable condition   DISCHARGE PLANNING:  Antibiotic duration: 24 hours antibiotics  Weightbearing: Nonweightbearing on the right  Pain medication: Low-dose opioid pathway due to his pulmonary disease from smoking  Dressing care/ Wound VAC: Wound VAC remain in place for 1 week  Ambulatory devices: Walker or crutches  Discharge to: Anticipate discharge to skilled nursing  Follow-up: In the office 1 week post operative.

## 2020-08-09 NOTE — Anesthesia Procedure Notes (Signed)
Procedure Name: LMA Insertion Date/Time: 08/09/2020 8:38 AM Performed by: Gwyndolyn Saxon, CRNA Pre-anesthesia Checklist: Patient identified, Emergency Drugs available, Suction available and Patient being monitored Patient Re-evaluated:Patient Re-evaluated prior to induction Oxygen Delivery Method: Circle system utilized Preoxygenation: Pre-oxygenation with 100% oxygen Induction Type: IV induction Ventilation: Mask ventilation without difficulty LMA: LMA inserted LMA Size: 5.0 Number of attempts: 1 Airway Equipment and Method: Patient positioned with wedge pillow Placement Confirmation: positive ETCO2 and breath sounds checked- equal and bilateral Tube secured with: Tape Dental Injury: Teeth and Oropharynx as per pre-operative assessment

## 2020-08-10 MED ORDER — MENTHOL 3 MG MT LOZG
1.0000 | LOZENGE | OROMUCOSAL | Status: DC | PRN
  Filled 2020-08-10: qty 9

## 2020-08-10 NOTE — Plan of Care (Signed)
Patient is s/p right ankle fusion on 11/17 due to increasing pain and arthritis. Compression wrap to right ankle clean dry and intact with ice and camboot. Patient has voided spontaneously as expected post op and prn meds are given as ordered for pain management. Will continue to monitor and continue current POC.

## 2020-08-10 NOTE — Progress Notes (Addendum)
Patient is postop day 1 status post right ankle arthrodesis.  He is sitting up in bed eating breakfast.  Denies fever, chills, or calf pain.  Praveena pump is in place however is not plugged in to the pump.  This was plug-in and the pump turned on was working well with bloody drainage plan for discharge to SNF Patient's O2 saturations are still in the 80s.  He is still requiring supplemental oxygen.  Not appropriate for discharge at this time

## 2020-08-10 NOTE — Evaluation (Signed)
Physical Therapy Evaluation Patient Details Name: Harold Bailey MRN: [id number] DOB: 1966-06-01 Today's Date: 08/10/2020   History of Present Illness  Pt is a 54 year old gentleman who is status post left ankle arthrodesis.  He also has a history of an open reduction internal fixation in 2014 of a right distal fibula fracture. PMH: COPD, DM, hx of L ankle fusion, seizures  Clinical Impression  PTA, patient lives with wife and modI with mobility using a cane. Patient currently presents with generalized weakness, decreased activity tolerance, impaired balance, decreased safety awareness, and impaired functional mobility. Patient requires maxA+2 for sit to stand transfers with RW, multiple attempts with patient able to achieve upright standing and maintain balance, however impulsive during standing attempts. Patient with impulsivity this session and decreased awareness of need for safety for himself, therapists, and others that will assist him. Patient disregarding therapist instructions and demands therapist be in front of him rather than the side and wants to show therapists what he can do. Patient able to side step towards [facility] with modA+2 and RW. Patient able to maintain NWB on R throughout session. Patient will benefit from skilled PT services during acute stay to address listed deficits. Recommend SNF following discharge to maximize functional mobility and independence.     Follow Up Recommendations SNF;Supervision/Assistance - 24 hour    Equipment Recommendations   (defer to post acute rehab)    Recommendations for Other Services       Precautions / Restrictions Precautions Precautions: Fall Required Braces or Orthoses: Other Brace Other Brace: cam boot Restrictions Weight Bearing Restrictions: Yes RLE Weight Bearing: Non weight bearing      Mobility  Bed Mobility Overal bed mobility: Needs Assistance Bed Mobility: Sit to Supine     Supine to sit: Supervision;HOB  elevated Sit to supine: Supervision   General bed mobility comments: Supervision for safety. Pt able to negotiate R LE in of bed using cam boot    Transfers Overall transfer level: Needs assistance Equipment used: Rolling Davonte Siebenaler (2 wheeled) Transfers: Sit to/from Stand Sit to Stand: Max assist;+2 safety/equipment;+2 physical assistance;From elevated surface         General transfer comment: maxA+2 for multiple sit to stand transfers with patient able to achieve and maintain balance in upright standing on 4th trial. Patient noted with impulsivity throughout sit to stand transfers and disregarding therapist instructions. Patient demanded therapists in front of him rather than at side and wanted to demo what he could do - difficulty adhering to safety for lines, therapists, etc. Discussed with patient about need for safety for himself, therapists, and other people that are assisting him to prevent injury.   Ambulation/Gait Ambulation/Gait assistance: Mod assist;+2 physical assistance;+2 safety/equipment Gait Distance (Feet): 2 Feet Assistive device: Rolling Aranda Bihm (2 wheeled) Gait Pattern/deviations: Step-to pattern Gait velocity: impulsive   General Gait Details: Patient able to take side steps up HOB Mod A x 2. Decent maintainence of NWB precautions but difficulty with side stepping due to impaired L ankle ROM (previous fusion). Pt noted with impulsivity throughout standing, demanding therapists in front of him rather than at side and wanted to demo what he could do - difficulty adhering to safety for lines, therapists, etc  Stairs            Wheelchair Mobility    Modified Rankin (Stroke Patients Only)       Balance Overall balance assessment: Needs assistance Sitting-balance support: No upper extremity supported;Feet supported Sitting balance-Leahy Scale: Good  Standing balance support: Bilateral upper extremity supported;During functional activity Standing  balance-Leahy Scale: Poor Standing balance comment: reliant on BUE support and external assist                             Pertinent Vitals/Pain Pain Assessment: 0-10 Pain Score: 8  Pain Descriptors / Indicators: Grimacing;Operative site guarding Pain Intervention(s): Monitored during session;Repositioned;Ice applied    Home Living Family/patient expects to be discharged to:: Private residence Living Arrangements: Spouse/significant other Available Help at Discharge: Family;Available PRN/intermittently (wife works) Type of Home: House Home Access: Stairs to enter   [employer] of Steps: 1 Home Layout: Multi-level;Bed/bath upstairs;Other (Comment) (split level) Home Equipment: Manmeet Arzola - 2 wheels;Cane - single point;Bedside commode Additional Comments: Pt lives in a split level home, no bathroom or bedroom on main floor, able to access basement via sidewalk with level entry. Half bath in basement. Full bath/bedroom on second floor    Prior Function Level of Independence: Independent with assistive device(s)         Comments: Reports use of cane for mobility, able to complete ADLs     Hand Dominance   Dominant Hand: Right    Extremity/Trunk Assessment   Upper Extremity Assessment Upper Extremity Assessment: Defer to OT evaluation    Lower Extremity Assessment Lower Extremity Assessment: RLE deficits/detail RLE: Unable to fully assess due to immobilization    Cervical / Trunk Assessment Cervical / Trunk Assessment: Normal  Communication   Communication: No difficulties  Cognition Arousal/Alertness: Awake/alert Behavior During Therapy: Impulsive Overall Cognitive Status: Impaired/Different from baseline Area of Impairment: Following commands;Safety/judgement;Awareness;Problem solving;Attention                   Current Attention Level: Selective   Following Commands: Follows one step commands with increased time Safety/Judgement:  Decreased awareness of safety;Decreased awareness of deficits Awareness: Emergent Problem Solving: Difficulty sequencing;Requires verbal cues;Requires tactile cues General Comments: Pt very impulsive, requiring frequent cues for safety and pt not adhering to therapist instruction. Decent ability to avoid NWB, aware of precautions and aware of difficult home access      General Comments General comments (skin integrity, edema, etc.): Upon arrival, patient sitting EOB with OT present. Patient on 4L O2, spO2 >88%. HR variable (one instance of 170 bpm on monitor but pulse ox reading 88 bpm so unsure of accuracy). Patient agreeable to further rehab at this time due to home setup and deficits.    Exercises     Assessment/Plan    PT Assessment Patient needs continued PT services  PT Problem List Decreased strength;Decreased range of motion;Decreased activity tolerance;Decreased balance;Decreased mobility;Decreased safety awareness;Decreased knowledge of precautions;Pain;Cardiopulmonary status limiting activity       PT Treatment Interventions DME instruction;Gait training;Stair training;Functional mobility training;Therapeutic activities;Therapeutic exercise;Balance training;Patient/family education;Wheelchair mobility training    PT Goals (Current goals can be found in the Care Plan section)  Acute Rehab PT Goals Patient Stated Goal: go to rehab and then home PT Goal Formulation: With patient Time For Goal Achievement: 08/24/20 Potential to Achieve Goals: Fair    Frequency Min 3X/week   Barriers to discharge Inaccessible home environment      Co-evaluation PT/OT/SLP Co-Evaluation/Treatment: Yes Reason for Co-Treatment: For patient/therapist safety;To address functional/ADL transfers PT goals addressed during session: Mobility/safety with mobility;Balance;Proper use of DME OT goals addressed during session: ADL's and self-care       AM-PAC PT "6 Clicks" Mobility  Outcome Measure  Help needed turning  from your back to your side while in a flat bed without using bedrails?: None Help needed moving from lying on your back to sitting on the side of a flat bed without using bedrails?: None Help needed moving to and from a bed to a chair (including a wheelchair)?: A Lot Help needed standing up from a chair using your arms (e.g., wheelchair or bedside chair)?: A Lot Help needed to walk in hospital room?: A Lot Help needed climbing 3-5 steps with a railing? : Total 6 Click Score: 15    End of Session Equipment Utilized During Treatment: Gait belt;Oxygen Activity Tolerance: Patient tolerated treatment well Patient left: in bed;with call bell/phone within reach;with bed alarm set Nurse Communication: Mobility status PT Visit Diagnosis: Unsteadiness on feet (R26.81);Other abnormalities of gait and mobility (R26.89);Muscle weakness (generalized) (M62.81)    Time: 5072-2575 PT Time Calculation (min) (ACUTE ONLY): 30 min   Charges:   PT Evaluation $PT Eval Moderate Complexity: 1 Mod PT Treatments $Therapeutic Activity: 8-22 mins        Perrin Maltese, PT, DPT Acute Rehabilitation Services Pager [phone] Office [phone]   Melene Plan Allred 08/10/2020, 11:54 AM

## 2020-08-10 NOTE — Evaluation (Signed)
Occupational Therapy Evaluation Patient Details Name: Harold Bailey MRN: [id number] DOB: [birth year] Today's Date: 08/10/2020    History of Present Illness Pt is a 54 year old gentleman who is status post left ankle arthrodesis.  He also has a history of an open reduction internal fixation in 2014 of a right distal fibula fracture. PMH: COPD, DM, hx of L ankle fusion, seizures   Clinical Impression   PTA, pt lives with wife and reports Modified Independence with ADLs and mobility using cane. Pt presents now with deficits in strength, endurance, balance, pain and safety awareness. Pt also with hx of L ankle fusion with decreased ROM. Pt overall Supervision for bed mobility, Max A x 2 for sit to stand trials with RW. During sit to stand trials, pt noted with impulsivity and decreased adherence to safety instructions, so pivot attempts deferred today. Pt able to demo side stepping to [facility] with RW and good adherence to NWB precautions with Mod A x 2. Pt requires Supervision for UB ADLs and up to Total A for LB ADLs due to deficits. Due to limited support at home (wife works) and difficult access in/out of home (split level home with many stairs), strongly recommend SNF for postacute rehab to maximize safety and independence.     Follow Up Recommendations  SNF;Supervision/Assistance - 24 hour    Equipment Recommendations  Wheelchair (measurements OT);Wheelchair cushion (measurements OT);3 in 1 bedside commode    Recommendations for Other Services       Precautions / Restrictions Precautions Precautions: Fall Required Braces or Orthoses: Other Brace Other Brace: cam boot Restrictions Weight Bearing Restrictions: Yes RLE Weight Bearing: Non weight bearing      Mobility Bed Mobility Overal bed mobility: Needs Assistance Bed Mobility: Supine to Sit;Sit to Supine     Supine to sit: Supervision;HOB elevated Sit to supine: Supervision   General bed mobility comments: Supervision for  safety. Pt able to negotiate R LE in/out of bed using cam boot    Transfers Overall transfer level: Needs assistance Equipment used: Rolling walker (2 wheeled) Transfers: Sit to/from Stand Sit to Stand: Max assist;+2 safety/equipment;+2 physical assistance;From elevated surface         General transfer comment: Max A x 2 for multiple sit to stand transfers. On 4th trial, pt able to acheive and maintain balance and take side steps up HOB Mod A x 2. Decent maintainence of NWB precautions but difficulty with side stepping due to impaired L ankle ROM (previous fusion). Pt noted with impulsivity throughout standing, demanding therapists in front of him rather than at side and wanted to demo what he could do - difficulty adhering to safety for lines, therapists, etc    Balance Overall balance assessment: Needs assistance Sitting-balance support: No upper extremity supported;Feet supported Sitting balance-Leahy Scale: Good     Standing balance support: Bilateral upper extremity supported;During functional activity Standing balance-Leahy Scale: Poor Standing balance comment: reliant on BUE support and external assist                           ADL either performed or assessed with clinical judgement   ADL Overall ADL's : Needs assistance/impaired Eating/Feeding: Set up;Sitting   Grooming: Set up;Sitting;Wash/dry face   Upper Body Bathing: Supervision/ safety;Sitting   Lower Body Bathing: Maximal assistance;Sitting/lateral leans   Upper Body Dressing : Supervision/safety;Sitting   Lower Body Dressing: Maximal assistance;Sitting/lateral leans       Toileting- Clothing Manipulation and Hygiene: Total assistance;Bed  level         General ADL Comments: Pt limited by pain, impulsiveness, NWB status of R LE and hx of L ankle fusion     Vision Patient Visual Report: No change from baseline Vision Assessment?: No apparent visual deficits     Perception     Praxis       Pertinent Vitals/Pain Pain Assessment: 0-10 Pain Score: 8  Pain Descriptors / Indicators: Grimacing;Operative site guarding Pain Intervention(s): Monitored during session;Repositioned;RN gave pain meds during session;Ice applied     Hand Dominance Right   Extremity/Trunk Assessment Upper Extremity Assessment Upper Extremity Assessment: Overall WFL for tasks assessed   Lower Extremity Assessment Lower Extremity Assessment: Defer to PT evaluation   Cervical / Trunk Assessment Cervical / Trunk Assessment: Normal   Communication Communication Communication: No difficulties   Cognition Arousal/Alertness: Awake/alert Behavior During Therapy: Impulsive Overall Cognitive Status: Impaired/Different from baseline Area of Impairment: Following commands;Safety/judgement;Awareness;Problem solving;Attention                   Current Attention Level: Selective   Following Commands: Follows one step commands with increased time Safety/Judgement: Decreased awareness of safety;Decreased awareness of deficits Awareness: Emergent Problem Solving: Difficulty sequencing;Requires verbal cues;Requires tactile cues General Comments: Pt very impulsive, requiring frequent cues for safety and pt not adhering to therapist instruction. Decent ability to avoid NWB, aware of precautions and aware of difficult home access   General Comments  Pt received on 4 L O2, SpO2 >88%. HR variable (one instance of 170bpm on monitor but pulse ox reading 88 so unsure of accuracy). Extended time spent problem solving home setup with pt acknowleding need for further rehab    Exercises     Shoulder Instructions      Home Living Family/patient expects to be discharged to:: Private residence Living Arrangements: Spouse/significant other Available Help at Discharge: Family;Available PRN/intermittently (wife works) Type of Home: House Home Access: Stairs to enter [employer] of Steps: 1   Home  Layout: Multi-level;Bed/bath upstairs;Other (Comment) (split level) Alternate Level Stairs-Number of Steps: 5-7 Alternate Level Stairs-Rails: Right;Left Bathroom Shower/Tub: Walk-in shower;Tub/shower unit   Bathroom Toilet: Standard     Home Equipment: Environmental consultant - 2 wheels;Cane - single point;Bedside commode   Additional Comments: Pt lives in a split level home, no bathroom or bedroom on main floor, able to access basement via sidewalk with level entry. Half bath in basement. Full bath/bedroom on second floor      Prior Functioning/Environment Level of Independence: Independent with assistive device(s)        Comments: Reports use of cane for mobility, able to complete ADLs        OT Problem List: Decreased strength;Decreased activity tolerance;Impaired balance (sitting and/or standing);Decreased cognition;Decreased safety awareness;Decreased knowledge of use of DME or AE;Cardiopulmonary status limiting activity;Pain      OT Treatment/Interventions: Self-care/ADL training;Therapeutic exercise;DME and/or AE instruction;Energy conservation;Therapeutic activities;Patient/family education;Balance training    OT Goals(Current goals can be found in the care plan section) Acute Rehab OT Goals Patient Stated Goal: go to rehab and then home OT Goal Formulation: With patient Time For Goal Achievement: 08/24/20 Potential to Achieve Goals: Good ADL Goals Pt Will Perform Grooming: with min assist;standing Pt Will Perform Lower Body Bathing: with min assist;sitting/lateral leans Pt Will Perform Lower Body Dressing: with min assist;sitting/lateral leans Pt Will Transfer to Toilet: with mod assist;stand pivot transfer;bedside commode Pt Will Perform Toileting - Clothing Manipulation and hygiene: with min assist;sitting/lateral leans  OT Frequency: Min 2X/week  Barriers to D/C: Inaccessible home environment          Co-evaluation PT/OT/SLP Co-Evaluation/Treatment: Yes Reason for  Co-Treatment: For patient/therapist safety;To address functional/ADL transfers   OT goals addressed during session: ADL's and self-care      AM-PAC OT "6 Clicks" Daily Activity     Outcome Measure Help from another person eating meals?: A Little Help from another person taking care of personal grooming?: A Little Help from another person toileting, which includes using toliet, bedpan, or urinal?: Total Help from another person bathing (including washing, rinsing, drying)?: A Lot Help from another person to put on and taking off regular upper body clothing?: A Little Help from another person to put on and taking off regular lower body clothing?: A Lot 6 Click Score: 14   End of Session Equipment Utilized During Treatment: Gait belt;Rolling walker;Oxygen Nurse Communication: Mobility status;Weight bearing status;Need for lift equipment  Activity Tolerance: Patient tolerated treatment well Patient left: in bed;with call bell/phone within reach;with bed alarm set  OT Visit Diagnosis: Unsteadiness on feet (R26.81);Other abnormalities of gait and mobility (R26.89);Muscle weakness (generalized) (M62.81);Pain Pain - Right/Left: Right Pain - part of body: Ankle and joints of foot                Time: 4270-6237 OT Time Calculation (min): 54 min Charges:  OT General Charges $OT Visit: 1 Visit OT Evaluation $OT Eval Moderate Complexity: 1 Mod OT Treatments $Self Care/Home Management : 8-22 mins  Layla Maw, OTR/L  Layla Maw 08/10/2020, 10:52 AM

## 2020-08-10 NOTE — TOC Initial Note (Signed)
Transition of Care [facility]) - Initial/Assessment Note    Patient Details  Name: Harold Bailey MRN: [id number] Date of Birth: [birth year]  [facility] CM/SW Contact:    Curlene Labrum, RN Phone Number: 08/10/2020, 4:26 PM  Clinical Narrative:                 Case management spoke with the patient's wife on the phone regarding transitions of care to SNF facility after his hospitalization.  The patient's wife was given Medicare choice regarding SNf placement and the wife prefers admission to [facility] if able.  The patient is a S/P Right ankle fusion with prevena wound vac by Dr. Sharol Given.  The patient is fully vaccinated with Wynetta Emery and [place] vaccine.  The wife reports that patient does not use drugs and consumes about 2 beers 2-3 times per week at home.  The patient is using 4 liters/min [place] home O2 at nighttime and has other dme at the home including a cane, rolling walker and 3:1.  Will place a SNf work up for the patient and reach out to the patient and wife tomorrow when bed offers are available.    Expected Discharge Plan: Skilled Nursing Facility Barriers to Discharge: Continued Medical Work up   Patient Goals and CMS Choice   CMS Medicare.gov Compare Post Acute Care list provided to:: Patient Represenative (must comment) (Patient sleeping - left message with wife, Vickie) Choice offered to / list presented to : Spouse  Expected Discharge Plan and Services Expected Discharge Plan: [place]   Discharge Planning Services: CM Consult Post Acute Care Choice: [place] Living arrangements for the past 2 months: Single Family Home                                      Prior Living Arrangements/Services Living arrangements for the past 2 months: Single Family Home Lives with:: Spouse Patient language and need for interpreter reviewed:: Yes Do you feel safe going back to the place where you live?: Yes       Need for Family Participation in Patient Care: Yes (Comment) Care giver support system in place?: Yes (comment) Current home services: DME Criminal Activity/Legal Involvement Pertinent to Current Situation/Hospitalization: No - Comment as needed  Activities of Daily Living Home Assistive Devices/Equipment: Cane (specify quad or straight), Walker (specify type) ADL Screening (condition at time of admission) Patient's cognitive ability adequate to safely complete daily activities?: Yes Is the patient deaf or have difficulty hearing?: No Does the patient have difficulty seeing, even when wearing glasses/contacts?: No Does the patient have difficulty concentrating, remembering, or making decisions?: No Patient able to express need for assistance with ADLs?: Yes Does the patient have difficulty dressing or bathing?: No Independently performs ADLs?: Yes (appropriate for developmental age) Does the patient have difficulty walking or climbing stairs?: Yes Weakness of Legs: Both Weakness of Arms/Hands: None  Permission Sought/Granted Permission sought to share information with : Case Manager    Share Information with NAME: wife, Jakarie Pember  Permission granted to share info w AGENCY: SNF facility        Emotional Assessment Appearance:: Appears stated age     Orientation: : Oriented to Self, Oriented to Place, Oriented to  Time, Oriented to Situation Alcohol / Substance Use: Alcohol Use Psych Involvement: No (comment)  Admission diagnosis:  Arthritis of right ankle [M19.071] Patient Active  Problem List   Diagnosis Date Noted  . Arthritis of right ankle 08/09/2020  . Post-traumatic osteoarthritis, right ankle and foot   . Positive colorectal cancer screening using Cologuard test   . Otitis media, left 10/15/2018  . On home oxygen therapy 08/26/2018  . Difficult intravenous access   . Chronic respiratory failure with hypoxia ([place])   . Essential hypertension 05/11/2017   . Plantar fasciitis of left foot 01/14/2017  . Traumatic arthritis of ankle, right 01/07/2017  . H/O ankle fusion 11/14/2016  . 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 At risk for adverse drug event 10/08/2016  . History of snoring 10/08/2016  . Subtalar joint instability, left   . 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 History of alcohol abuse 07/02/2016  . COPD exacerbation ([place]) 07/02/2016  . Tobacco dependence 06/06/2015  . Medication overuse headache 02/28/2015  . Localization-related symptomatic epilepsy and epileptic syndromes with complex partial seizures, not intractable, without status epilepticus ([place]) 02/28/2015  . Cognitive impairment 02/28/2015  . Tobacco abuse 02/28/2015  . Brain tumor ([place]) 11/19/2013  . Seizures ([place]) 11/19/2013  . Memory loss   . HA (headache)   . Gait disturbance   . Falls frequently 10/15/2013  . Swelling 09/09/2013  . Rash and nonspecific skin eruption 09/09/2013  . Ankle fracture, lateral malleolus, closed 09/03/2013  . Unspecified vitamin D deficiency 08/12/2013  . Polycythemia   . Memory deficit   . History of drug abuse ([place])   . GERD (gastroesophageal reflux disease)   . S/P tricuspid valve repair   . Arthritis   . Hyperlipidemia   . Prediabetes    PCP:  Lucia Gaskins, MD Pharmacy:   [facility], [place] [place] [address] Phone: [phone] Fax: [phone]     Social Determinants of Health (SDOH) Interventions    Readmission Risk Interventions No flowsheet data found.

## 2020-08-10 NOTE — NC FL2 (Signed)
[place] LEVEL OF CARE SCREENING TOOL     IDENTIFICATION  Patient Name: Harold Bailey Birthdate: [birth year] Sex: male Admission Date (Current Location): 08/09/2020  [facility] and [place] Number:  Herbalist and Address:  The . [facility], [place] [address], [place], [place] [address]      Provider Number: [id number]  Attending Physician Name and Address:  [name], MD  Relative Name and Phone Number:  Larrell Rapozo - wife - [phone]    Current Level of Care: Hospital Recommended Level of Care: [place] Prior Approval Number:    Date Approved/Denied:   PASRR Number: [id number]  Discharge Plan: SNF    Current Diagnoses: Patient Active Problem List   Diagnosis Date Noted  . Arthritis of right ankle 08/09/2020  . Post-traumatic osteoarthritis, right ankle and foot   . Positive colorectal cancer screening using Cologuard test   . Otitis media, left 10/15/2018  . On home oxygen therapy 08/26/2018  . Difficult intravenous access   . Chronic respiratory failure with hypoxia ([place])   . Essential hypertension 05/11/2017  . Plantar fasciitis of left foot 01/14/2017  . Traumatic arthritis of ankle, right 01/07/2017  . H/O ankle fusion 11/14/2016  . 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 At risk for adverse drug event 10/08/2016  . History of snoring 10/08/2016  . Subtalar joint instability, left   . 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 History of alcohol abuse 07/02/2016  . COPD exacerbation ([place]) 07/02/2016  . Tobacco dependence 06/06/2015  . Medication overuse headache 02/28/2015  . Localization-related symptomatic epilepsy and epileptic syndromes with complex partial seizures, not intractable, without status epilepticus ([place]) 02/28/2015  . Cognitive impairment 02/28/2015  . Tobacco abuse 02/28/2015  . Brain tumor ([place]) 11/19/2013  . Seizures ([place]) 11/19/2013  . Memory  loss   . HA (headache)   . Gait disturbance   . Falls frequently 10/15/2013  . Swelling 09/09/2013  . Rash and nonspecific skin eruption 09/09/2013  . Ankle fracture, lateral malleolus, closed 09/03/2013  . Unspecified vitamin D deficiency 08/12/2013  . Polycythemia   . Memory deficit   . History of drug abuse ([place])   . GERD (gastroesophageal reflux disease)   . S/P tricuspid valve repair   . Arthritis   . Hyperlipidemia   . Prediabetes     Orientation RESPIRATION BLADDER Height & Weight     Self, Time, Situation, Place  Normal, O2 (Hx - home O2 use - 4 liters/min  at night) Continent Weight: 110.5 kg Height:  6' (182.9 cm)  BEHAVIORAL SYMPTOMS/MOOD NEUROLOGICAL BOWEL NUTRITION STATUS     (last seizure - 09/2019 per wife) Continent Diet (See Discharge Summary)  AMBULATORY STATUS COMMUNICATION OF NEEDS Skin   Extensive Assist Verbally Surgical wounds (prevena wound vac in place)                       Personal Care Assistance Level of Assistance  Bathing, Dressing Bathing Assistance: Limited assistance   Dressing Assistance: Limited assistance     Functional Limitations Info  Sight, Hearing, Speech Sight Info: Adequate Hearing Info: Adequate Speech Info: Adequate    SPECIAL CARE FACTORS FREQUENCY  PT (By licensed PT), OT (By licensed OT)     PT Frequency: 5 x per week OT Frequency: 5 x per week            Contractures Contractures Info: Not present  Additional Factors Info  Code Status, Allergies, Psychotropic Code Status Info: Full code Allergies Info: Hydrocodone, lyrica Psychotropic Info: trileptal, xanex, elavil, wellbutrin, depakote, neurontin         Current Medications (08/10/2020):  This is the current hospital active medication list Current Facility-Administered Medications  Medication Dose Route Frequency Provider Last Rate Last Admin  . 0.9 %  sodium chloride infusion   Intravenous Continuous Persons, Bevely Palmer, [place] 75 mL/hr at  08/09/20 1200 New Bag at 08/09/20 1200  . acetaminophen (TYLENOL) tablet 325-650 mg  325-650 mg Oral Q6H PRN Persons, Bevely Palmer, PA      . albuterol (VENTOLIN HFA) 108 (90 Base) MCG/ACT inhaler 2 puff  2 puff Inhalation Q6H PRN Persons, Bevely Palmer, [place]      . allopurinol (ZYLOPRIM) tablet 300 mg  300 mg Oral Daily Persons, Bevely Palmer, PA   300 mg at 08/10/20 1610  . ALPRAZolam Duanne Moron) tablet 1 mg  1 mg Oral TID PRN Persons, Bevely Palmer, PA   1 mg at 08/10/20 1323  . amitriptyline (ELAVIL) tablet 75 mg  75 mg Oral QHS Persons, Bevely Palmer, [place]   75 mg at 08/09/20 2041  . aspirin EC tablet 81 mg  81 mg Oral Daily Persons, Bevely Palmer, [place]   81 mg at 08/10/20 9604  . buPROPion (WELLBUTRIN SR) 12 hr tablet 150 mg  150 mg Oral BID Persons, Bevely Palmer, PA   150 mg at 08/10/20 0824  . divalproex (DEPAKOTE ER) 24 hr tablet 1,000 mg  1,000 mg Oral Daily Persons, Bevely Palmer, PA   1,000 mg at 08/10/20 5409  . docusate sodium (COLACE) capsule 100 mg  100 mg Oral BID Persons, Bevely Palmer, PA   100 mg at 08/10/20 8119  . fluticasone furoate-vilanterol (BREO ELLIPTA) 100-25 MCG/INH 1 puff  1 puff Inhalation Daily Persons, Bevely Palmer, PA   1 puff at 08/10/20 0816   And  . umeclidinium bromide (INCRUSE ELLIPTA) 62.5 MCG/INH 1 puff  1 puff Inhalation Daily Persons, Bevely Palmer, [place]   1 puff at 08/10/20 0816  . gabapentin (NEURONTIN) capsule 300 mg  300 mg Oral BID Persons, Bevely Palmer, PA   300 mg at 08/10/20 0825  . metFORMIN (GLUCOPHAGE) tablet 500 mg  500 mg Oral BID WC Persons, Bevely Palmer, PA   500 mg at 08/10/20 0800  . metoCLOPramide (REGLAN) tablet 5-10 mg  5-10 mg Oral Q8H PRN Persons, Bevely Palmer, PA       Or  . metoCLOPramide (REGLAN) injection 5-10 mg  5-10 mg Intravenous Q8H PRN Persons, Bevely Palmer, PA      . metoprolol succinate (TOPROL-XL) 24 hr tablet 50 mg  50 mg Oral Daily Persons, Bevely Palmer, PA   50 mg at 08/10/20 0824  . morphine 2 MG/ML injection 0.5 mg  0.5 mg Intravenous Q3H PRN Persons, Bevely Palmer, PA   0.5 mg at  08/09/20 2210  . naphazoline-pheniramine (NAPHCON-A) 0.025-0.3 % ophthalmic solution 1 drop  1 drop Both Eyes Q8H PRN Persons, Bevely Palmer, PA      . ondansetron [facility]) tablet 4 mg  4 mg Oral Q6H PRN Persons, Bevely Palmer, PA       Or  . ondansetron (ZOFRAN) injection 4 mg  4 mg Intravenous Q6H PRN Persons, Bevely Palmer, PA      . OXcarbazepine (TRILEPTAL) tablet 450 mg  450 mg Oral BID Persons, Bevely Palmer, PA   450 mg at 08/10/20 1478  . oxyCODONE-acetaminophen (PERCOCET/ROXICET) 5-325 MG per  tablet 1 tablet  1 tablet Oral Q4H PRN Persons, Bevely Palmer, [place]   1 tablet at 08/10/20 1223  . rosuvastatin (CRESTOR) tablet 10 mg  10 mg Oral q1800 Persons, Bevely Palmer, [place]   10 mg at 08/09/20 0122     Discharge Medications: Please see discharge summary for a list of discharge medications.  Relevant Imaging Results:  Relevant Lab Results:   Additional Information SSN: [id number]  Curlene Labrum, RN

## 2020-08-10 NOTE — Progress Notes (Signed)
Messaged PA [facility] Persons re: med request from patient and request by wife to speak with provider.

## 2020-08-11 ENCOUNTER — Encounter (HOSPITAL_COMMUNITY): Payer: Self-pay | Admitting: Orthopedic Surgery

## 2020-08-11 MED ORDER — POLYETHYLENE GLYCOL 3350 17 G PO PACK
17.0000 g | PACK | Freq: Every day | ORAL | Status: DC
  Administered 2020-08-11 – 2020-08-12 (×2): 17 g via ORAL
  Filled 2020-08-11 (×2): qty 1

## 2020-08-11 MED ORDER — POLYETHYLENE GLYCOL 3350 17 G PO PACK
17.0000 g | PACK | Freq: Every day | ORAL | 0 refills | Status: DC
Start: 2020-08-11 — End: 2023-11-26

## 2020-08-11 MED ORDER — OXYCODONE-ACETAMINOPHEN 5-325 MG PO TABS: 1.0000 | ORAL_TABLET | ORAL | 0 refills | Status: DC | PRN

## 2020-08-11 MED ORDER — DOCUSATE SODIUM 100 MG PO CAPS
100.0000 mg | ORAL_CAPSULE | Freq: Two times a day (BID) | ORAL | 0 refills | Status: DC
Start: 2020-08-11 — End: 2022-03-18

## 2020-08-11 NOTE — Progress Notes (Signed)
Physical Therapy Treatment Patient Details Name: Harold Bailey MRN: [id number] DOB: 06/05/1966 Today's Date: 08/11/2020    History of Present Illness Pt is a 54 year old gentleman who is status post left ankle arthrodesis.  He also has a history of an open reduction internal fixation in 2014 of a right distal fibula fracture. PMH: COPD, DM, hx of L ankle fusion, seizures    PT Comments    Patient progressing towards physical therapy goals. Patient demonstrates decreased safety awareness with need for frequent cueing for safety for himself and others assisting. Patient performed bed mobility with supervision. Patient required maxA+2 for sit to stand with RW, cues for L foot position prior to standing and hand placement. Patient performed stand pivot transfer bed>recliner with RW and mod+2 for safety, RW management, and assist to maintain upright. Patient with uncontrolled descent to chair. Patient continues to be limited by pain, decreased activity tolerance, generalized weakness, impaired balance, and impaired functional mobility. Continue to recommend SNF for ongoing Physical Therapy.       Follow Up Recommendations  SNF;Supervision/Assistance - 24 hour     Equipment Recommendations   (defer to post acute rehab)    Recommendations for Other Services       Precautions / Restrictions Precautions Precautions: Fall Required Braces or Orthoses: Other Brace Other Brace: cam boot Restrictions Weight Bearing Restrictions: Yes RLE Weight Bearing: Non weight bearing    Mobility  Bed Mobility Overal bed mobility: Needs Assistance Bed Mobility: Supine to Sit     Supine to sit: Supervision;HOB elevated        Transfers Overall transfer level: Needs assistance Equipment used: Rolling Harold Bailey (2 wheeled) Transfers: Sit to/from Harold Bailey Sit to Stand: Max assist;+2 physical assistance;+2 safety/equipment Stand pivot transfers: Mod assist;+2 physical  assistance;+2 safety/equipment       General transfer comment: modA+2 for stand pivot transfer for RW management and assistance to maintain upright. Patient with uncontrolled descent into chair. Patient requires frequent cueing for sequencing   Ambulation/Gait                 Stairs             Wheelchair Mobility    Modified Rankin (Stroke Patients Only)       Balance Overall balance assessment: Needs assistance Sitting-balance support: No upper extremity supported;Feet supported Sitting balance-Harold Bailey: Good     Standing balance support: Bilateral upper extremity supported;During functional activity Standing balance-Harold Bailey: Poor Standing balance comment: reliant on BUE support and external assist                            Cognition Arousal/Alertness: Awake/alert Behavior During Therapy: Impulsive Overall Cognitive Status: Impaired/Different from baseline Area of Impairment: Attention;Following commands;Safety/judgement;Awareness;Problem solving                   Current Attention Level: Selective   Following Commands: Follows one step commands with increased time Safety/Judgement: Decreased awareness of safety;Decreased awareness of deficits Awareness: Emergent Problem Solving: Difficulty sequencing;Requires verbal cues;Requires tactile cues General Comments: Pt continues to be impulsive, requires frequent cueing to follow directions       Exercises General Exercises - Lower Extremity Long Arc Quad: Both;10 reps;Seated Straight Leg Raises: AROM;Both;10 reps Hip Flexion/Marching: AROM;Both;10 reps;Seated (PT foot under pt's to avoid unintentional WB through R LE)    General Comments General comments (skin integrity, edema, etc.): Pt on 4L O2 Harold Bailey, spO2 sitting EOB 85%.  Instructed patient on pursed lip breathing with improvement to 91%. Following stand pivot transfer, spO2 initially 94% then dropped to 91%.      Pertinent  Vitals/Pain Pain Assessment: Faces Faces Pain Bailey: Hurts little more Pain Location: L lower leg Pain Descriptors / Indicators: Grimacing;Operative site guarding Pain Intervention(s): Monitored during session    Home Living                      Prior Function            PT Goals (current goals can now be found in the care plan section) Acute Rehab PT Goals Patient Stated Goal: go to rehab and then home PT Goal Formulation: With patient Time For Goal Achievement: 08/24/20 Potential to Achieve Goals: Fair Progress towards PT goals: Progressing toward goals    Frequency    Min 3X/week      PT Plan Current plan remains appropriate    Co-evaluation              AM-PAC PT "6 Clicks" Mobility   Outcome Measure  Help needed turning from your back to your side while in a flat bed without using bedrails?: None Help needed moving from lying on your back to sitting on the side of a flat bed without using bedrails?: None Help needed moving to and from a bed to a chair (including a wheelchair)?: A Lot Help needed standing up from a chair using your arms (e.g., wheelchair or bedside chair)?: A Lot Help needed to walk in hospital room?: A Lot Help needed climbing 3-5 steps with a railing? : Total 6 Click Score: 15    End of Session Equipment Utilized During Treatment: Gait belt;Oxygen Activity Tolerance: Patient tolerated treatment well Patient left: in chair;with call bell/phone within reach;with chair alarm set;with nursing/sitter in room Nurse Communication: Mobility status PT Visit Diagnosis: Unsteadiness on feet (R26.81);Other abnormalities of gait and mobility (R26.89);Muscle weakness (generalized) (M62.81)     Time: 6759-1638 PT Time Calculation (min) (ACUTE ONLY): 25 min  Charges:  $Therapeutic Exercise: 8-22 mins $Therapeutic Activity: 8-22 mins                     Harold Bailey, PT, DPT Acute Rehabilitation Services Pager [phone] Office  [phone]    Harold Bailey 08/11/2020, 12:59 PM

## 2020-08-11 NOTE — Plan of Care (Signed)
  Problem: Pain Managment: Goal: General experience of comfort will improve Outcome: Progressing   

## 2020-08-11 NOTE — TOC Progression Note (Addendum)
Transition of Care [facility]) - Progression Note    Patient Details  Name: Harold Bailey MRN: [id number] Date of Birth: 10/12/1965  [facility]) CM/SW [place], RN Phone Number: 08/11/2020, 2:40 PM  Clinical Narrative:    Case management called and spoke with the patient's wife on the phone regarding Medicare choice for selection of the patient' [place] placement.  Harold Bailey, wife was given a list of the accepting [place] facilities and she states that she will call me back this afternoon regarding her choice of facilities so that I can start insurance authorization and possible placement of the patient in the facility over the weekend.  The patient is fully vaccinated with Wynetta Emery and Wynetta Emery vaccine but needs a COVID booster according to the patient's wife.  Will continue to follow for SNF choice, start of insurance authorization and placement.  11/19-2021 1450-  Case management called Harold Bailey and started insurance authorization with a pending SNf choice from the family at this time.  I faxed clinicals to [facility] to fax # [phone] ref # [id number].  Will need to call [place] back with the SNF facility choice from the patient's wife as soon as she returns my call today.  08/11/2020 1635 - The patient's wife called back and chose [facility] - I called [facility] and Harold Bailey, CSW is holding a bed for the patient at the facility.  Case management called [facility] back and updated insurance authorization with chosen facility.  Will continue to follow for [facility] placement.   Expected Discharge Plan: [place] Barriers to Discharge: Continued Medical Work up  Expected Discharge Plan and Services Expected Discharge Plan: [place]   Discharge Planning Services: CM Consult Post Acute Care Choice: [place] arrangements for the past 2 months: Single Family Home Expected Discharge Date:  08/12/20                                     Social Determinants of Health (SDOH) Interventions    Readmission Risk Interventions No flowsheet data found.

## 2020-08-11 NOTE — Plan of Care (Signed)
No acute changes since the previous night that I took care of him. Patient got up and ambulated x2 assist and the walker to the bathroom tonight to try to have a BM - unsuccessful. Ice applied to right ankle with camboot tonight before bed. Pain controlled with prn meds as ordered. Will continue to monitor and continue current POC.

## 2020-08-11 NOTE — Progress Notes (Addendum)
Patient is postop day 2 status post ankle arthrodesis.  On the right.  He is comfortable sitting in bed.  Praveena VAC is in place he did say it became disconnected but was reconnected.  Is working with no drainage since yesterday   Vital signs stable afebrile plan for discharge to skilled nursing when bed available.  He understands the importance of continuing to not smoke.  He is requesting a laxative as he has had no bowel movement will order MiraLAX today  Oxygen saturations have improved slightly to the low 90s.  He did have however a reading of 88 this morning and oxygen was increased to 4 L nasal cannula.  Encourage deep breathing and cough.  I spoke with respiratory therapy.  They have been following him.  Apparently his baseline at home is high 80s to low 90s.  She will put in as needed nebulizer orders.

## 2020-08-12 LAB — RESPIRATORY PANEL BY RT PCR (FLU A&B, COVID)
Influenza A by PCR: NEGATIVE
Influenza B by PCR: NEGATIVE
SARS Coronavirus 2 by RT PCR: NEGATIVE

## 2020-08-12 MED ORDER — BISACODYL 10 MG RE SUPP
10.0000 mg | Freq: Every day | RECTAL | Status: DC | PRN
  Administered 2020-08-12: 10 mg via RECTAL
  Filled 2020-08-12: qty 1

## 2020-08-12 MED ORDER — OXYCODONE-ACETAMINOPHEN 5-325 MG PO TABS
1.0000 | ORAL_TABLET | ORAL | 0 refills | Status: AC | PRN
Start: 2020-08-12 — End: ?

## 2020-08-12 MED ORDER — ALPRAZOLAM 1 MG PO TABS: 1.0000 mg | ORAL_TABLET | Freq: Three times a day (TID) | ORAL | 0 refills | Status: DC | PRN

## 2020-08-12 NOTE — TOC Transition Note (Signed)
Transition of Care [facility]) - CM/SW Discharge Note   Patient Details  Name: Harold Bailey MRN: [id number] Date of Birth: 09/27/1965  Transition of Care [facility]) CM/SW Contact:  Joanne Chars, LCSW Phone Number: 08/12/2020, 10:38 AM   Clinical Narrative:   Pt discharging to [facility] Room B 18-1.  RN call report to [phone].    Final next level of care: Skilled Nursing Facility Barriers to Discharge: Barriers Resolved   Patient Goals and CMS Choice   CMS Medicare.gov Compare Post Acute Care list provided to:: Patient Represenative (must comment) (Patient sleeping - left message with wife, Vickie) Choice offered to / list presented to : Spouse  Discharge Placement              Patient chooses bed at:  [facility]) Patient to be transferred to facility by: [place] Name of family member notified: Vickie, wife Patient and family notified of of transfer: 08/12/20  Discharge Plan and Services   Discharge Planning Services: CM Consult Post Acute Care Choice: [place]                               Social Determinants of Health (SDOH) Interventions     Readmission Risk Interventions No flowsheet data found.

## 2020-08-12 NOTE — Progress Notes (Signed)
Pt was walking to the bathroom with the walker, maint NWB to RLE. CAM boot on to right leg. Wound vac dressing to right lower leg intact with Prevena vac on. Report was given to Shanon Brow at [place]. Discharged pt to [facility] via [place].

## 2020-08-12 NOTE — Discharge Summary (Signed)
Discharge Diagnoses:  Active Problems:   Post-traumatic osteoarthritis, right ankle and foot   Arthritis of right ankle   Surgeries: Procedure(s): RIGHT ANKLE FUSION on 08/09/2020    Consultants:   Discharged Condition: Improved  Hospital Course: Harold Bailey is an 55 y.o. male who was admitted 08/09/2020 with a chief complaint of traumatic arthritis right ankle, with a final diagnosis of Osteoarthritis Right Ankle.  Patient was brought to the operating room on 08/09/2020 and underwent Procedure(s): RIGHT ANKLE FUSION.    Patient was given perioperative antibiotics:  Anti-infectives (From admission, onward)   Start     Dose/Rate Route Frequency Ordered Stop   08/09/20 1230  ceFAZolin (ANCEF) IVPB 2g/100 mL premix        2 g 200 mL/hr over 30 Minutes Intravenous Every 6 hours 08/09/20 1104 08/10/20 0047   08/09/20 0715  ceFAZolin (ANCEF) IVPB 2g/100 mL premix        2 g 200 mL/hr over 30 Minutes Intravenous On call to O.R. 08/09/20 0700 08/09/20 0909    .  Patient was given sequential compression devices, early ambulation, and aspirin for DVT prophylaxis.  Recent vital signs:  Patient Vitals for the past 24 hrs:  BP Temp Temp src Pulse Resp SpO2  08/12/20 0944 109/70 97.7 F (36.5 C) Oral 91 18 90 %  08/12/20 0916 -- -- -- -- -- 93 %  08/12/20 0500 110/70 98.2 F (36.8 C) Oral 85 17 95 %  08/11/20 2025 124/82 98.5 F (36.9 C) Oral 98 18 98 %  08/11/20 1536 132/89 98 F (36.7 C) Oral (!) 104 15 90 %  .  Recent laboratory studies: No results found.  Discharge Medications:   Allergies as of 08/12/2020      Reactions   Hydrocodone Rash   Can take percocet   Lyrica [pregabalin] Swelling   Swelling and itching in hands   Other    No narcotics due to history of opioid dependence      Medication List    STOP taking these medications   BC FAST PAIN RELIEF PO   nabumetone 750 MG tablet Commonly known as: RELAFEN   oxyCODONE 5 MG immediate release  tablet Commonly known as: Oxy IR/ROXICODONE     TAKE these medications   albuterol 108 (90 Base) MCG/ACT inhaler Commonly known as: VENTOLIN HFA Inhale 2 puffs into the lungs every 6 (six) hours as needed for wheezing or shortness of breath.   allopurinol 300 MG tablet Commonly known as: ZYLOPRIM Take 300 mg by mouth daily.   ALPRAZolam 1 MG tablet Commonly known as: Xanax Take 1 tablet (1 mg total) by mouth 3 (three) times daily as needed for sleep. What changed: reasons to take this   amitriptyline 75 MG tablet Commonly known as: ELAVIL Take 1 tablet (75 mg total) by mouth at bedtime.   aspirin EC 81 MG tablet Take 81 mg by mouth daily.   buPROPion 150 MG 12 hr tablet Commonly known as: WELLBUTRIN SR Take 150 mg by mouth 2 (two) times daily.   divalproex 500 MG 24 hr tablet Commonly known as: DEPAKOTE ER TAKE 2 TABLETS(1000 MG) BY MOUTH DAILY What changed: See the new instructions.   docusate sodium 100 MG capsule Commonly known as: COLACE Take 1 capsule (100 mg total) by mouth 2 (two) times daily.   gabapentin 300 MG capsule Commonly known as: NEURONTIN TAKE 1 CAPSULE(300 MG) BY MOUTH TWICE DAILY What changed: See the new instructions.   meloxicam 7.5  MG tablet Commonly known as: MOBIC TAKE 1 TABLET(7.5 MG) BY MOUTH DAILY What changed: See the new instructions.   metFORMIN 500 MG tablet Commonly known as: GLUCOPHAGE Take 500 mg by mouth 2 (two) times daily.   metoprolol succinate 50 MG 24 hr tablet Commonly known as: TOPROL-XL Take 50 mg by mouth daily.   multivitamin with minerals tablet Take 2 tablets by mouth daily.   neomycin-polymyxin-hydrocortisone 3.5-10000-1 OTIC suspension Commonly known as: CORTISPORIN Place 2 drops into the left ear 2 (two) times daily as needed (ear pain).   Opcon-A 0.027-0.315 % Soln Generic drug: Naphazoline-Pheniramine Place 1 drop into both eyes every 8 (eight) hours as needed (red, irritated eyes).   Oxcarbazepine  300 MG tablet Commonly known as: TRILEPTAL Take 1.5 tablets (450 mg total) by mouth 2 (two) times daily.   oxyCODONE-acetaminophen 5-325 MG tablet Commonly known as: PERCOCET/ROXICET Take 1 tablet by mouth every 4 (four) hours as needed.   OXYGEN Inhale into the lungs. At home oxygen   polyethylene glycol 17 g packet Commonly known as: MIRALAX / GLYCOLAX Take 17 g by mouth daily.   rosuvastatin 10 MG tablet Commonly known as: CRESTOR Take 10 mg by mouth daily at 6 PM.   Trelegy Ellipta 100-62.5-25 MCG/INH Aepb Generic drug: Fluticasone-Umeclidin-Vilant INHALE 1 PUFF INTO THE LUNGS DAILY What changed: See the new instructions.   VITAMIN C PO Take 1 tablet by mouth daily.   VITAMIN D PO Take 1 capsule by mouth daily.   Voltaren 1 % Gel Generic drug: diclofenac Sodium APPLY 2 TO 4 GRAMS EXTERNALLY TO THE AFFECTED AREA TWICE DAILY What changed: See the new instructions.       Diagnostic Studies: DG MINI C-ARM IMAGE ONLY  Result Date: 08/09/2020 There is no interpretation for this exam.  This order is for images obtained during a surgical procedure.  Please See "Surgeries" Tab for more information regarding the procedure.    Patient benefited maximally from their hospital stay and there were no complications.     Disposition: Discharge disposition: 03-Skilled Nursing Facility      Discharge Instructions    Call MD / Call 911   Complete by: As directed    If you experience chest pain or shortness of breath, CALL 911 and be transported to the hospital emergency room.  If you develope a fever above 101 F, pus (white drainage) or increased drainage or redness at the wound, or calf pain, call your surgeon's office.   Call MD / Call 911   Complete by: As directed    If you experience chest pain or shortness of breath, CALL 911 and be transported to the hospital emergency room.  If you develope a fever above 101 F, pus (white drainage) or increased drainage or redness  at the wound, or calf pain, call your surgeon's office.   Constipation Prevention   Complete by: As directed    Drink plenty of fluids.  Prune juice may be helpful.  You may use a stool softener, such as Colace (over the counter) 100 mg twice a day.  Use MiraLax (over the counter) for constipation as needed.   Constipation Prevention   Complete by: As directed    Drink plenty of fluids.  Prune juice may be helpful.  You may use a stool softener, such as Colace (over the counter) 100 mg twice a day.  Use MiraLax (over the counter) for constipation as needed.   Diet - low sodium heart healthy   Complete  by: As directed    Diet - low sodium heart healthy   Complete by: As directed    Discharge instructions   Complete by: As directed    Nonweightbearing on right leg elevate right leg should have cam walker boot on when up and about show patient how to attach Prevena pump.  Please call office if pump beeps or stops working   Increase activity slowly as tolerated   Complete by: As directed    Increase activity slowly as tolerated   Complete by: As directed    Negative Pressure Wound Therapy - Incisional   Complete by: As directed    Show patient how to attach prevena vac   Negative Pressure Wound Therapy - Incisional   Complete by: As directed       Contact information for follow-up providers    Persons, Bevely Palmer, PA In 1 week.   Specialty: Orthopedic Surgery Contact information: [place] [address] [phone]            Contact information for after-discharge care    [facility] .   Service: Skilled Nursing Contact information: [address] [place] [phone]                   Signed: [name] 08/12/2020, 10:53 AM

## 2020-08-12 NOTE — Discharge Summary (Signed)
Discharge Diagnoses:  Active Problems:   Post-traumatic osteoarthritis, right ankle and foot   Arthritis of right ankle   Surgeries: Procedure(s): RIGHT ANKLE FUSION on 08/09/2020    Consultants:   Discharged Condition: Improved  Hospital Course: Harold Bailey is an 54 y.o. male who was admitted 08/09/2020 with a chief complaint of traumatic osteoarthritis right ankle, with a final diagnosis of Osteoarthritis Right Ankle.  Patient was brought to the operating room on 08/09/2020 and underwent Procedure(s): RIGHT ANKLE FUSION.    Patient was given perioperative antibiotics:  Anti-infectives (From admission, onward)   Start     Dose/Rate Route Frequency Ordered Stop   08/09/20 1230  ceFAZolin (ANCEF) IVPB 2g/100 mL premix        2 g 200 mL/hr over 30 Minutes Intravenous Every 6 hours 08/09/20 1104 08/10/20 0047   08/09/20 0715  ceFAZolin (ANCEF) IVPB 2g/100 mL premix        2 g 200 mL/hr over 30 Minutes Intravenous On call to O.R. 08/09/20 0700 08/09/20 0909    .  Patient was given sequential compression devices, early ambulation, and aspirin for DVT prophylaxis.  Recent vital signs:  Patient Vitals for the past 24 hrs:  BP Temp Temp src Pulse Resp SpO2  08/12/20 0944 109/70 97.7 F (36.5 C) Oral 91 18 90 %  08/12/20 0916 -- -- -- -- -- 93 %  08/12/20 0500 110/70 98.2 F (36.8 C) Oral 85 17 95 %  08/11/20 2025 124/82 98.5 F (36.9 C) Oral 98 18 98 %  08/11/20 1536 132/89 98 F (36.7 C) Oral (!) 104 15 90 %  .  Recent laboratory studies: No results found.  Discharge Medications:   Allergies as of 08/12/2020      Reactions   Hydrocodone Rash   Can take percocet   Lyrica [pregabalin] Swelling   Swelling and itching in hands   Other    No narcotics due to history of opioid dependence      Medication List    STOP taking these medications   BC FAST PAIN RELIEF PO   nabumetone 750 MG tablet Commonly known as: RELAFEN   oxyCODONE 5 MG immediate release  tablet Commonly known as: Oxy IR/ROXICODONE     TAKE these medications   albuterol 108 (90 Base) MCG/ACT inhaler Commonly known as: VENTOLIN HFA Inhale 2 puffs into the lungs every 6 (six) hours as needed for wheezing or shortness of breath.   allopurinol 300 MG tablet Commonly known as: ZYLOPRIM Take 300 mg by mouth daily.   ALPRAZolam 1 MG tablet Commonly known as: XANAX Take 1 mg by mouth 3 (three) times daily as needed for anxiety.   amitriptyline 75 MG tablet Commonly known as: ELAVIL Take 1 tablet (75 mg total) by mouth at bedtime.   aspirin EC 81 MG tablet Take 81 mg by mouth daily.   buPROPion 150 MG 12 hr tablet Commonly known as: WELLBUTRIN SR Take 150 mg by mouth 2 (two) times daily.   divalproex 500 MG 24 hr tablet Commonly known as: DEPAKOTE ER TAKE 2 TABLETS(1000 MG) BY MOUTH DAILY What changed: See the new instructions.   docusate sodium 100 MG capsule Commonly known as: COLACE Take 1 capsule (100 mg total) by mouth 2 (two) times daily.   gabapentin 300 MG capsule Commonly known as: NEURONTIN TAKE 1 CAPSULE(300 MG) BY MOUTH TWICE DAILY What changed: See the new instructions.   meloxicam 7.5 MG tablet Commonly known as: MOBIC TAKE 1 TABLET(7.5  MG) BY MOUTH DAILY What changed: See the new instructions.   metFORMIN 500 MG tablet Commonly known as: GLUCOPHAGE Take 500 mg by mouth 2 (two) times daily.   metoprolol succinate 50 MG 24 hr tablet Commonly known as: TOPROL-XL Take 50 mg by mouth daily.   multivitamin with minerals tablet Take 2 tablets by mouth daily.   neomycin-polymyxin-hydrocortisone 3.5-10000-1 OTIC suspension Commonly known as: CORTISPORIN Place 2 drops into the left ear 2 (two) times daily as needed (ear pain).   Opcon-A 0.027-0.315 % Soln Generic drug: Naphazoline-Pheniramine Place 1 drop into both eyes every 8 (eight) hours as needed (red, irritated eyes).   Oxcarbazepine 300 MG tablet Commonly known as: TRILEPTAL Take  1.5 tablets (450 mg total) by mouth 2 (two) times daily.   oxyCODONE-acetaminophen 5-325 MG tablet Commonly known as: PERCOCET/ROXICET Take 1 tablet by mouth every 4 (four) hours as needed (severe pain).   OXYGEN Inhale into the lungs. At home oxygen   polyethylene glycol 17 g packet Commonly known as: MIRALAX / GLYCOLAX Take 17 g by mouth daily.   rosuvastatin 10 MG tablet Commonly known as: CRESTOR Take 10 mg by mouth daily at 6 PM.   Trelegy Ellipta 100-62.5-25 MCG/INH Aepb Generic drug: Fluticasone-Umeclidin-Vilant INHALE 1 PUFF INTO THE LUNGS DAILY What changed: See the new instructions.   VITAMIN C PO Take 1 tablet by mouth daily.   VITAMIN D PO Take 1 capsule by mouth daily.   Voltaren 1 % Gel Generic drug: diclofenac Sodium APPLY 2 TO 4 GRAMS EXTERNALLY TO THE AFFECTED AREA TWICE DAILY What changed: See the new instructions.       Diagnostic Studies: DG MINI C-ARM IMAGE ONLY  Result Date: 08/09/2020 There is no interpretation for this exam.  This order is for images obtained during a surgical procedure.  Please See "Surgeries" Tab for more information regarding the procedure.    Patient benefited maximally from their hospital stay and there were no complications.     Disposition: Discharge disposition: 03-Skilled Nursing Facility      Discharge Instructions    Call MD / Call 911   Complete by: As directed    If you experience chest pain or shortness of breath, CALL 911 and be transported to the hospital emergency room.  If you develope a fever above 101 F, pus (white drainage) or increased drainage or redness at the wound, or calf pain, call your surgeon's office.   Call MD / Call 911   Complete by: As directed    If you experience chest pain or shortness of breath, CALL 911 and be transported to the hospital emergency room.  If you develope a fever above 101 F, pus (white drainage) or increased drainage or redness at the wound, or calf pain, call your  surgeon's office.   Constipation Prevention   Complete by: As directed    Drink plenty of fluids.  Prune juice may be helpful.  You may use a stool softener, such as Colace (over the counter) 100 mg twice a day.  Use MiraLax (over the counter) for constipation as needed.   Constipation Prevention   Complete by: As directed    Drink plenty of fluids.  Prune juice may be helpful.  You may use a stool softener, such as Colace (over the counter) 100 mg twice a day.  Use MiraLax (over the counter) for constipation as needed.   Diet - low sodium heart healthy   Complete by: As directed    Diet -  low sodium heart healthy   Complete by: As directed    Discharge instructions   Complete by: As directed    Nonweightbearing on right leg elevate right leg should have cam walker boot on when up and about show patient how to attach Prevena pump.  Please call office if pump beeps or stops working   Increase activity slowly as tolerated   Complete by: As directed    Increase activity slowly as tolerated   Complete by: As directed    Negative Pressure Wound Therapy - Incisional   Complete by: As directed    Show patient how to attach prevena vac   Negative Pressure Wound Therapy - Incisional   Complete by: As directed       Follow-up Information    Persons, Bevely Palmer, PA In 1 week.   Specialty: Orthopedic Surgery Contact information: [address] [place] [address] [phone]                Signed: [name] 08/12/2020, 10:18 AM

## 2020-08-12 NOTE — Progress Notes (Signed)
Patient ID: Harold Bailey, male   DOB: 1966-07-09, 54 y.o.   MRN: [id number] The wound VAC is functioning well no drainage patient's orders are completed for discharge to skilled nursing today the wound VAC will need to be plugged into maintain at discharge ideally nonweightbearing on the right lower extremity.  Patient is to wear the fracture boot for gait training.

## 2020-08-14 ENCOUNTER — Telehealth: Payer: Self-pay | Admitting: *Deleted

## 2020-08-14 NOTE — Telephone Encounter (Signed)
Patients wife called she had to cancel patients appt for tomorrow since patient had ankle surgery and is in rehab. She asked if patient can follow up after his stay at rehab. Spoke to [place] PA ok to wait if patient isnt having any problems . Wife will call to reschedule appt after patient finishes rehab.

## 2020-08-15 ENCOUNTER — Encounter (HOSPITAL_COMMUNITY): Payer: Medicare HMO

## 2020-08-15 ENCOUNTER — Encounter: Payer: Self-pay | Admitting: Physician Assistant

## 2020-08-15 ENCOUNTER — Ambulatory Visit (INDEPENDENT_AMBULATORY_CARE_PROVIDER_SITE_OTHER): Payer: Medicare HMO | Admitting: Orthopedic Surgery

## 2020-08-15 VITALS — Ht 72.0 in | Wt 243.0 lb

## 2020-08-15 DIAGNOSIS — Z981 Arthrodesis status: Secondary | ICD-10-CM

## 2020-08-16 ENCOUNTER — Inpatient Hospital Stay: Payer: Medicare HMO | Admitting: Family

## 2020-08-21 ENCOUNTER — Encounter: Payer: Self-pay | Admitting: Orthopedic Surgery

## 2020-08-21 NOTE — Progress Notes (Signed)
Office Visit Note   Patient: Harold Bailey           Date of Birth: 07/23/1966           MRN: [id number] Visit Date: 08/15/2020              Requested by: Lucia Gaskins, MD [address] [place],  [place] [address] PCP: Lucia Gaskins, MD  Chief Complaint  Patient presents with  . Right Ankle - Routine Post Op    08/15/20 right ankle fusion       HPI: Patient is a 54 year old gentleman who is seen in follow-up 1 week after right ankle fusion the wound VAC had stopped working.  Assessment & Plan: Visit Diagnoses:  1. H/O ankle fusion     Plan: Wound VAC was removed patient will start dry dressing changes continue nonweightbearing  Repeat radiographs at follow-up  Follow-Up Instructions: Return in about 1 week (around 08/22/2020).   Ortho Exam  Patient is alert, oriented, no adenopathy, well-dressed, normal affect, normal respiratory effort. Examination the incision is clean there is a small amount of bloody drainage there is no cellulitis no wound dehiscence no signs of infection.  Imaging: No results found. No images are attached to the encounter.  Labs: Lab Results  Component Value Date   HGBA1C 6.0 (H) 04/25/2020   HGBA1C 6.3 (H) 02/01/2020   HGBA1C 6.3 (H) 06/10/2017   LABURIC 5.2 04/25/2020   REPTSTATUS 02/23/2020 FINAL 02/21/2020   GRAMSTAIN  05/11/2017    ABUNDANT WBC PRESENT, PREDOMINANTLY PMN FEW GRAM NEGATIVE RODS    CULT  02/21/2020    NO GROWTH Performed at [facility], [place] [address], [place], [place] [address]    LABORGA METHICILLIN RESISTANT STAPHYLOCOCCUS AUREUS 05/11/2017     Lab Results  Component Value Date   ALBUMIN 4.2 07/31/2020   ALBUMIN 3.8 02/01/2020   ALBUMIN 4.4 05/08/2018   LABURIC 5.2 04/25/2020    Lab Results  Component Value Date   MG 2.0 04/25/2020   MG 2.0 05/28/2017   MG 2.3 05/23/2017   Lab Results  Component Value Date   VD25OH 34.03 04/25/2020   VD25OH 37.9 02/01/2020    No  results found for: PREALBUMIN CBC EXTENDED Latest Ref Rng & Units 08/09/2020 07/31/2020 07/12/2020  WBC 4.0 - 10.5 K/uL - 7.7 -  RBC 4.22 - 5.81 Mil/uL - 5.82(H) -  HGB 13.0 - 17.0 g/dL 20.7(H) 19.5 Repeated and verified X2.(HH) 21.8(HH)  HCT 39 - 52 % 61.0(H) 57.8(H) 64.0(H)  PLT 150 - 400 K/uL - 172.0 -  NEUTROABS 1.4 - 7.7 K/uL - 4.1 -  LYMPHSABS 0.7 - 4.0 K/uL - 2.6 -     Body mass index is 32.96 kg/m.  Orders:  No orders of the defined types were placed in this encounter.  No orders of the defined types were placed in this encounter.    Procedures: No procedures performed  Clinical Data: No additional findings.  ROS:  All other systems negative, except as noted in the HPI. Review of Systems  Objective: Vital Signs: Ht 6' (1.829 m)   Wt 243 lb (110.2 kg)   BMI 32.96 kg/m   Specialty Comments:  No specialty comments available.  PMFS History: Patient Active Problem List   Diagnosis Date Noted  . Arthritis of right ankle 08/09/2020  . Post-traumatic osteoarthritis, right ankle and foot   . Positive colorectal cancer screening using Cologuard test   . Otitis media, left 10/15/2018  .  On home oxygen therapy 08/26/2018  . Difficult intravenous access   . Chronic respiratory failure with hypoxia ([place])   . Essential hypertension 05/11/2017  . Plantar fasciitis of left foot 01/14/2017  . Traumatic arthritis of ankle, right 01/07/2017  . H/O ankle fusion 11/14/2016  . 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 At risk for adverse drug event 10/08/2016  . History of snoring 10/08/2016  . Subtalar joint instability, left   . 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 History of alcohol abuse 07/02/2016  . COPD exacerbation ([place]) 07/02/2016  . Tobacco dependence 06/06/2015  . Medication overuse headache 02/28/2015  . Localization-related symptomatic epilepsy and epileptic syndromes with complex partial seizures, not intractable, without status  epilepticus ([place]) 02/28/2015  . Cognitive impairment 02/28/2015  . Tobacco abuse 02/28/2015  . Brain tumor ([place]) 11/19/2013  . Seizures ([place]) 11/19/2013  . Memory loss   . HA (headache)   . Gait disturbance   . Falls frequently 10/15/2013  . Swelling 09/09/2013  . Rash and nonspecific skin eruption 09/09/2013  . Ankle fracture, lateral malleolus, closed 09/03/2013  . Unspecified vitamin D deficiency 08/12/2013  . Polycythemia   . Memory deficit   . History of drug abuse ([place])   . GERD (gastroesophageal reflux disease)   . S/P tricuspid valve repair   . Arthritis   . Hyperlipidemia   . Prediabetes    Past Medical History:  Diagnosis Date  . Anxiety   . Arthritis    Left ankle, hand  . COPD (chronic obstructive pulmonary disease) ([place])   . COPD (chronic obstructive pulmonary disease) ([place])   . Diabetes mellitus without complication ([place])   . Drug abuse ([place])   . DVT (deep venous thrombosis) ([facility])    Bilateral legs  . Dysrhythmia    SVT  . Gait disturbance   . GERD (gastroesophageal reflux disease)   . 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  Marland Kitchen History of ankle surgery   . History of brain tumor   . Hyperlipidemia   . Hypertension   . 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 Memory loss   . On home oxygen therapy 08/26/2018   at night while lying down  . Peripheral vision loss    Left due to brain surgery  . Pneumonia   . Polycythemia   . Seizures ([place]) 11/19/2013   none since 2016  . Shortness of breath    Hx of smoking.  Marland Kitchen SVT (supraventricular tachycardia) ([place])   . Wears dentures   . Wears glasses     Family History  Problem Relation Age of Onset  . Hypertension Mother   . Bronchitis Mother   . Cancer Maternal Aunt   . Cancer Maternal Grandmother   . Healthy Sister   . Healthy Brother   . Stroke Neg Hx   . Diabetes Neg Hx     Past Surgical History:  Procedure Laterality Date    . ABDOMINAL AORTOGRAM W/LOWER EXTREMITY N/A 05/30/2017   Procedure: ABDOMINAL AORTOGRAM W/LOWER EXTREMITY;  Surgeon: Elam Dutch, MD;  Location: [facility];  Service: Cardiovascular;  Laterality: N/A;  . ABDOMINAL AORTOGRAM W/LOWER EXTREMITY N/A 07/12/2020   Procedure: ABDOMINAL AORTOGRAM W/LOWER EXTREMITY;  Surgeon: [name], MD;  Location: [facility];  Service: Cardiovascular;  Laterality: N/A;  . ANKLE ARTHROSCOPY  01/17/2012   Procedure: ANKLE ARTHROSCOPY;  Surgeon:  [name], MD;  Location: [facility];  Service: Orthopedics;  Laterality: Left;  . ANKLE ARTHROSCOPY WITH FUSION Left 10/02/2016   Procedure: Left Posterior Subtalar Arthrodesis Arthroscopy;  Surgeon: [name], MD;  Location: [facility];  Service: Orthopedics;  Laterality: Left;  . ANKLE FUSION Left 11/2012   Dr Sharol Given  . ANKLE FUSION Right 08/09/2020   Procedure: RIGHT ANKLE FUSION;  Surgeon: [name], MD;  Location: [facility];  Service: Orthopedics;  Laterality: Right;  . ANKLE SURGERY  2003   Left  . BRAIN SURGERY  2009   Biopsy  . COLONOSCOPY WITH PROPOFOL N/A 03/11/2019   Procedure: COLONOSCOPY WITH PROPOFOL;  Surgeon: Mauri Pole, MD;  [facility];  Service: Endoscopy;  Laterality: N/A;  . FRACTURE SURGERY  2003   Left ankle  . HARDWARE REMOVAL Left 12/08/2012   Procedure: HARDWARE REMOVAL;  Surgeon: [name], MD;  Location: [facility];  Service: Orthopedics;  Laterality: Left;  Removal Deep Hardware, Take Down Non-Union, Revision Internal Fixation Left Ankle  . I & D EXTREMITY Left 01/01/2013   Procedure: IRRIGATION AND DEBRIDEMENT EXTREMITY;  Surgeon: [name], MD;  Location: [facility];  Service: Orthopedics;  Laterality: Left;  Irrigation, Debridement and Placement Antibiotic Beads Left Ankle  . INNER EAR SURGERY    . OPEN REDUCTION INTERNAL FIXATION (ORIF) SCAPHOID WITH DISTAL RADIUS GRAFT Right 02/01/2013   Procedure: OPEN REDUCTION INTERNAL FIXATION (ORIF) RIGHT  SCAPHOID FRACTURE WITH DISTAL RADIUS GRAFT;  Surgeon: Schuyler Amor, MD;  Location: [facility];  Service: Orthopedics;  Laterality: Right;  . ORIF ANKLE FRACTURE Left 12/08/2012   Procedure: OPEN REDUCTION INTERNAL FIXATION (ORIF) ANKLE FRACTURE;  Surgeon: [name], MD;  Location: [facility];  Service: Orthopedics;  Laterality: Left;  Removal Deep Hardware, Take Down Non-Union, Revision Internal Fixation Left Ankle  . ORIF ANKLE FRACTURE Right 09/03/2013   Procedure: OPEN REDUCTION INTERNAL FIXATION (ORIF) ANKLE FRACTURE;  Surgeon: [name], MD;  Location: [facility];  Service: Orthopedics;  Laterality: Right;  Open Reduction Internal Fixation Right Fibula  . POLYPECTOMY  03/11/2019   Procedure: POLYPECTOMY;  Surgeon: Mauri Pole, MD;  [facility];  Service: Endoscopy;;  . SCAPHOID FRACTURE SURGERY  02/04/2013  . SHOULDER ARTHROSCOPY  2008   Left  . TYMPANOSTOMY TUBE PLACEMENT  1993   Social History   Occupational History    Employer: NOT EMPLOYED    Comment: Disabled  Tobacco Use  . Smoking status: Current Every Day Smoker    Packs/day: 1.00    Years: 40.00    Pack years: 40.00    Types: Cigarettes  . Smokeless tobacco: Never Used  Vaping Use  . Vaping Use: Never used  Substance and Sexual Activity  . Alcohol use: No    Alcohol/week: 0.0 standard drinks    Comment: quit Oct. 2017(heavy drinker) .In [place]  . Drug use: No  . Sexual activity: Not Currently

## 2020-08-23 ENCOUNTER — Encounter: Payer: Self-pay | Admitting: Physician Assistant

## 2020-08-23 ENCOUNTER — Ambulatory Visit (INDEPENDENT_AMBULATORY_CARE_PROVIDER_SITE_OTHER): Payer: Medicare HMO

## 2020-08-23 ENCOUNTER — Ambulatory Visit (INDEPENDENT_AMBULATORY_CARE_PROVIDER_SITE_OTHER): Payer: Medicare HMO | Admitting: Physician Assistant

## 2020-08-23 DIAGNOSIS — Z981 Arthrodesis status: Secondary | ICD-10-CM

## 2020-08-23 NOTE — Progress Notes (Signed)
Office Visit Note   Patient: Harold Bailey           Date of Birth: 1966/09/10           MRN: [id number] Visit Date: 08/23/2020              Requested by: Lucia Gaskins, MD [address] [place],  [place] [address] PCP: Lucia Gaskins, MD  Chief Complaint  Patient presents with  . Right Ankle - Pain      HPI: This is a pleasant gentleman who is 2 weeks status post right ankle arthrodesis.  He has been nonweightbearing in a nursing home.  He did say that his foot got stepped on by a wheelchair but otherwise has no complaints denies fever, chills, or calf pain  Assessment & Plan: Visit Diagnoses:  1. H/O ankle fusion     Plan: Patient will continue nonweightbearing for at least 2 more weeks follow-up at that time new x-rays should be obtained of his ankle  Follow-Up Instructions: No follow-ups on file.   Ortho Exam  Patient is alert, oriented, no adenopathy, well-dressed, normal affect, normal respiratory effort. Incision is healed with well opposed wound edges sutures were harvested today mild soft tissue swelling no erythema no drainage compartments are soft and compressible  Imaging: No results found. No images are attached to the encounter.  Labs: Lab Results  Component Value Date   HGBA1C 6.0 (H) 04/25/2020   HGBA1C 6.3 (H) 02/01/2020   HGBA1C 6.3 (H) 06/10/2017   LABURIC 5.2 04/25/2020   REPTSTATUS 02/23/2020 FINAL 02/21/2020   GRAMSTAIN  05/11/2017    ABUNDANT WBC PRESENT, PREDOMINANTLY PMN FEW GRAM NEGATIVE RODS    CULT  02/21/2020    NO GROWTH Performed at [facility], [place] [address], [place], [place] [address]    LABORGA METHICILLIN RESISTANT STAPHYLOCOCCUS AUREUS 05/11/2017     Lab Results  Component Value Date   ALBUMIN 4.2 07/31/2020   ALBUMIN 3.8 02/01/2020   ALBUMIN 4.4 05/08/2018   LABURIC 5.2 04/25/2020    Lab Results  Component Value Date   MG 2.0 04/25/2020   MG 2.0 05/28/2017   MG 2.3 05/23/2017   Lab  Results  Component Value Date   VD25OH 34.03 04/25/2020   VD25OH 37.9 02/01/2020    No results found for: PREALBUMIN CBC EXTENDED Latest Ref Rng & Units 08/09/2020 07/31/2020 07/12/2020  WBC 4.0 - 10.5 K/uL - 7.7 -  RBC 4.22 - 5.81 Mil/uL - 5.82(H) -  HGB 13.0 - 17.0 g/dL 20.7(H) 19.5 Repeated and verified X2.(HH) 21.8(HH)  HCT 39 - 52 % 61.0(H) 57.8(H) 64.0(H)  PLT 150 - 400 K/uL - 172.0 -  NEUTROABS 1.4 - 7.7 K/uL - 4.1 -  LYMPHSABS 0.7 - 4.0 K/uL - 2.6 -     There is no height or weight on file to calculate BMI.  Orders:  Orders Placed This Encounter  Procedures  . XR Ankle Complete Right   No orders of the defined types were placed in this encounter.    Procedures: No procedures performed  Clinical Data: No additional findings.  ROS:  All other systems negative, except as noted in the HPI. Review of Systems  Objective: Vital Signs: There were no vitals taken for this visit.  Specialty Comments:  No specialty comments available.  PMFS History: Patient Active Problem List   Diagnosis Date Noted  . Arthritis of right ankle 08/09/2020  . Post-traumatic osteoarthritis, right ankle and foot   .  Positive colorectal cancer screening using Cologuard test   . Otitis media, left 10/15/2018  . On home oxygen therapy 08/26/2018  . Difficult intravenous access   . Chronic respiratory failure with hypoxia ([place])   . Essential hypertension 05/11/2017  . Plantar fasciitis of left foot 01/14/2017  . Traumatic arthritis of ankle, right 01/07/2017  . H/O ankle fusion 11/14/2016  . 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 At risk for adverse drug event 10/08/2016  . History of snoring 10/08/2016  . Subtalar joint instability, left   . 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 History of alcohol abuse 07/02/2016  . COPD exacerbation ([place]) 07/02/2016  . Tobacco dependence 06/06/2015  . Medication overuse headache 02/28/2015  . 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 Cognitive impairment 02/28/2015  . Tobacco abuse 02/28/2015  . Brain tumor ([place]) 11/19/2013  . Seizures ([place]) 11/19/2013  . Memory loss   . HA (headache)   . Gait disturbance   . Falls frequently 10/15/2013  . Swelling 09/09/2013  . Rash and nonspecific skin eruption 09/09/2013  . Ankle fracture, lateral malleolus, closed 09/03/2013  . Unspecified vitamin D deficiency 08/12/2013  . Polycythemia   . Memory deficit   . History of drug abuse ([place])   . GERD (gastroesophageal reflux disease)   . S/P tricuspid valve repair   . Arthritis   . Hyperlipidemia   . Prediabetes    Past Medical History:  Diagnosis Date  . Anxiety   . Arthritis    Left ankle, hand  . COPD (chronic obstructive pulmonary disease) ([place])   . COPD (chronic obstructive pulmonary disease) ([place])   . Diabetes mellitus without complication ([place])   . Drug abuse ([place])   . DVT (deep venous thrombosis) ([facility])    Bilateral legs  . Dysrhythmia    SVT  . Gait disturbance   . GERD (gastroesophageal reflux disease)   . 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  Marland Kitchen History of ankle surgery   . History of brain tumor   . Hyperlipidemia   . Hypertension   . 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 Memory loss   . On home oxygen therapy 08/26/2018   at night while lying down  . Peripheral vision loss    Left due to brain surgery  . Pneumonia   . Polycythemia   . Seizures ([place]) 11/19/2013   none since 2016  . Shortness of breath    Hx of smoking.  Marland Kitchen SVT (supraventricular tachycardia) ([place])   . Wears dentures   . Wears glasses     Family History  Problem Relation Age of Onset  . Hypertension Mother   . Bronchitis Mother   . Cancer Maternal Aunt   . Cancer Maternal Grandmother   . Healthy Sister   .  Healthy Brother   . Stroke Neg Hx   . Diabetes Neg Hx     Past Surgical History:  Procedure Laterality Date  . ABDOMINAL AORTOGRAM W/LOWER EXTREMITY N/A 05/30/2017   Procedure: ABDOMINAL AORTOGRAM W/LOWER EXTREMITY;  Surgeon: Elam Dutch, MD;  Location: [facility];  Service: Cardiovascular;  Laterality: N/A;  . ABDOMINAL AORTOGRAM W/LOWER EXTREMITY N/A 07/12/2020   Procedure: ABDOMINAL AORTOGRAM W/LOWER EXTREMITY;  Surgeon: [name], MD;  Location: [facility];  Service: Cardiovascular;  Laterality: N/A;  . ANKLE ARTHROSCOPY  01/17/2012   Procedure: ANKLE ARTHROSCOPY;  Surgeon: [name], MD;  Location: [facility];  Service: Orthopedics;  Laterality: Left;  . ANKLE ARTHROSCOPY WITH FUSION Left 10/02/2016   Procedure: Left Posterior Subtalar Arthrodesis Arthroscopy;  Surgeon: [name], MD;  Location: [facility];  Service: Orthopedics;  Laterality: Left;  . ANKLE FUSION Left 11/2012   Dr Sharol Given  . ANKLE FUSION Right 08/09/2020   Procedure: RIGHT ANKLE FUSION;  Surgeon: [name], MD;  Location: [facility];  Service: Orthopedics;  Laterality: Right;  . ANKLE SURGERY  2003   Left  . BRAIN SURGERY  2009   Biopsy  . COLONOSCOPY WITH PROPOFOL N/A 03/11/2019   Procedure: COLONOSCOPY WITH PROPOFOL;  Surgeon: Mauri Pole, MD;  [facility];  Service: Endoscopy;  Laterality: N/A;  . FRACTURE SURGERY  2003   Left ankle  . HARDWARE REMOVAL Left 12/08/2012   Procedure: HARDWARE REMOVAL;  Surgeon: [name], MD;  Location: [facility];  Service: Orthopedics;  Laterality: Left;  Removal Deep Hardware, Take Down Non-Union, Revision Internal Fixation Left Ankle  . I & D EXTREMITY Left 01/01/2013   Procedure: IRRIGATION AND DEBRIDEMENT EXTREMITY;  Surgeon: [name], MD;  Location: [facility];  Service: Orthopedics;  Laterality: Left;  Irrigation, Debridement and Placement Antibiotic Beads Left Ankle  . INNER EAR SURGERY    . OPEN REDUCTION INTERNAL FIXATION (ORIF)  SCAPHOID WITH DISTAL RADIUS GRAFT Right 02/01/2013   Procedure: OPEN REDUCTION INTERNAL FIXATION (ORIF) RIGHT SCAPHOID FRACTURE WITH DISTAL RADIUS GRAFT;  Surgeon: Schuyler Amor, MD;  Location: [facility];  Service: Orthopedics;  Laterality: Right;  . ORIF ANKLE FRACTURE Left 12/08/2012   Procedure: OPEN REDUCTION INTERNAL FIXATION (ORIF) ANKLE FRACTURE;  Surgeon: [name], MD;  Location: [facility];  Service: Orthopedics;  Laterality: Left;  Removal Deep Hardware, Take Down Non-Union, Revision Internal Fixation Left Ankle  . ORIF ANKLE FRACTURE Right 09/03/2013   Procedure: OPEN REDUCTION INTERNAL FIXATION (ORIF) ANKLE FRACTURE;  Surgeon: [name], MD;  Location: [facility];  Service: Orthopedics;  Laterality: Right;  Open Reduction Internal Fixation Right Fibula  . POLYPECTOMY  03/11/2019   Procedure: POLYPECTOMY;  Surgeon: Mauri Pole, MD;  [facility];  Service: Endoscopy;;  . SCAPHOID FRACTURE SURGERY  02/04/2013  . SHOULDER ARTHROSCOPY  2008   Left  . TYMPANOSTOMY TUBE PLACEMENT  1993   Social History   Occupational History    Employer: NOT EMPLOYED    Comment: Disabled  Tobacco Use  . Smoking status: Current Every Day Smoker    Packs/day: 1.00    Years: 40.00    Pack years: 40.00    Types: Cigarettes  . Smokeless tobacco: Never Used  Vaping Use  . Vaping Use: Never used  Substance and Sexual Activity  . Alcohol use: No    Alcohol/week: 0.0 standard drinks    Comment: quit Oct. 2017(heavy drinker) .In [place]  . Drug use: No  . Sexual activity: Not Currently

## 2020-08-31 ENCOUNTER — Other Ambulatory Visit: Payer: Self-pay | Admitting: Neurology

## 2020-08-31 ENCOUNTER — Telehealth: Payer: Self-pay | Admitting: Orthopedic Surgery

## 2020-08-31 NOTE — Telephone Encounter (Signed)
Patient called asked if it's ok to stretch and rotate his ankle? Patient also said he is moving his ankle back and forth. The number to contact patient is [phone]

## 2020-09-01 NOTE — Telephone Encounter (Signed)
I called and lm on vm for pt to advise of message below. To call with questions.

## 2020-09-01 NOTE — Telephone Encounter (Signed)
Pt is 3 weeks s/p ankle fusion please see message below.

## 2020-09-01 NOTE — Telephone Encounter (Signed)
Can wiggle toes rub foot and ankle but no rotational or up and down movement of ankle

## 2020-09-06 ENCOUNTER — Ambulatory Visit (INDEPENDENT_AMBULATORY_CARE_PROVIDER_SITE_OTHER): Payer: Medicare HMO | Admitting: Physician Assistant

## 2020-09-06 ENCOUNTER — Ambulatory Visit (INDEPENDENT_AMBULATORY_CARE_PROVIDER_SITE_OTHER): Payer: Medicare HMO

## 2020-09-06 ENCOUNTER — Encounter: Payer: Self-pay | Admitting: Physician Assistant

## 2020-09-06 DIAGNOSIS — Z981 Arthrodesis status: Secondary | ICD-10-CM

## 2020-09-06 NOTE — Progress Notes (Signed)
Office Visit Note   Patient: Harold Bailey           Date of Birth: 02-09-1966           MRN: [id number] Visit Date: 09/06/2020              Requested by: Lucia Gaskins, MD [address] [place],  [place] [address] PCP: Lucia Gaskins, MD  Chief Complaint  Patient presents with  . Right Ankle - Routine Post Op    08/09/20 right ankle fusion       HPI: This is a pleasant gentleman who is 1 month status post right ankle fusion.  He is overall doing well he has been nonweightbearing in his boot.  Denies fever, chills, or calf pain  Assessment & Plan: Visit Diagnoses:  1. H/O ankle fusion     Plan: May begin weightbearing with his walker in his boot.  Follow-up in 3 weeks for repeat x-rays.  Follow-Up Instructions: No follow-ups on file.   Ortho Exam  Patient is alert, oriented, no adenopathy, well-dressed, normal affect, normal respiratory effort. Examination of the ankle demonstrates well-healed surgical incision mild amount of soft tissue swelling compartments soft and nontender no cellulitis no signs of infection  Imaging: No results found. No images are attached to the encounter.  Labs: Lab Results  Component Value Date   HGBA1C 6.0 (H) 04/25/2020   HGBA1C 6.3 (H) 02/01/2020   HGBA1C 6.3 (H) 06/10/2017   LABURIC 5.2 04/25/2020   REPTSTATUS 02/23/2020 FINAL 02/21/2020   GRAMSTAIN  05/11/2017    ABUNDANT WBC PRESENT, PREDOMINANTLY PMN FEW GRAM NEGATIVE RODS    CULT  02/21/2020    NO GROWTH Performed at [facility], [place] [address], [place], [place] [address]    LABORGA METHICILLIN RESISTANT STAPHYLOCOCCUS AUREUS 05/11/2017     Lab Results  Component Value Date   ALBUMIN 4.2 07/31/2020   ALBUMIN 3.8 02/01/2020   ALBUMIN 4.4 05/08/2018   LABURIC 5.2 04/25/2020    Lab Results  Component Value Date   MG 2.0 04/25/2020   MG 2.0 05/28/2017   MG 2.3 05/23/2017   Lab Results  Component Value Date   VD25OH 34.03 04/25/2020    VD25OH 37.9 02/01/2020    No results found for: PREALBUMIN CBC EXTENDED Latest Ref Rng & Units 08/09/2020 07/31/2020 07/12/2020  WBC 4.0 - 10.5 K/uL - 7.7 -  RBC 4.22 - 5.81 Mil/uL - 5.82(H) -  HGB 13.0 - 17.0 g/dL 20.7(H) 19.5 Repeated and verified X2.(HH) 21.8(HH)  HCT 39.0 - 52.0 % 61.0(H) 57.8(H) 64.0(H)  PLT 150.0 - 400.0 K/uL - 172.0 -  NEUTROABS 1.4 - 7.7 K/uL - 4.1 -  LYMPHSABS 0.7 - 4.0 K/uL - 2.6 -     There is no height or weight on file to calculate BMI.  Orders:  Orders Placed This Encounter  Procedures  . XR Ankle Complete Right   No orders of the defined types were placed in this encounter.    Procedures: No procedures performed  Clinical Data: No additional findings.  ROS:  All other systems negative, except as noted in the HPI. Review of Systems  Objective: Vital Signs: There were no vitals taken for this visit.  Specialty Comments:  No specialty comments available.  PMFS History: Patient Active Problem List   Diagnosis Date Noted  . Arthritis of right ankle 08/09/2020  . Post-traumatic osteoarthritis, right ankle and foot   . Positive colorectal cancer screening using Cologuard test   .  Otitis media, left 10/15/2018  . On home oxygen therapy 08/26/2018  . Difficult intravenous access   . Chronic respiratory failure with hypoxia ([place])   . Essential hypertension 05/11/2017  . Plantar fasciitis of left foot 01/14/2017  . Traumatic arthritis of ankle, right 01/07/2017  . H/O ankle fusion 11/14/2016  . 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 At risk for adverse drug event 10/08/2016  . History of snoring 10/08/2016  . Subtalar joint instability, left   . 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 History of alcohol abuse 07/02/2016  . COPD exacerbation ([place]) 07/02/2016  . Tobacco dependence 06/06/2015  . Medication overuse headache 02/28/2015  . Localization-related symptomatic epilepsy and epileptic syndromes with complex  partial seizures, not intractable, without status epilepticus ([place]) 02/28/2015  . Cognitive impairment 02/28/2015  . Tobacco abuse 02/28/2015  . Brain tumor ([place]) 11/19/2013  . Seizures ([place]) 11/19/2013  . Memory loss   . HA (headache)   . Gait disturbance   . Falls frequently 10/15/2013  . Swelling 09/09/2013  . Rash and nonspecific skin eruption 09/09/2013  . Ankle fracture, lateral malleolus, closed 09/03/2013  . Unspecified vitamin D deficiency 08/12/2013  . Polycythemia   . Memory deficit   . History of drug abuse ([place])   . GERD (gastroesophageal reflux disease)   . S/P tricuspid valve repair   . Arthritis   . Hyperlipidemia   . Prediabetes    Past Medical History:  Diagnosis Date  . Anxiety   . Arthritis    Left ankle, hand  . COPD (chronic obstructive pulmonary disease) ([place])   . COPD (chronic obstructive pulmonary disease) ([place])   . Diabetes mellitus without complication ([place])   . Drug abuse ([place])   . DVT (deep venous thrombosis) ([facility])    Bilateral legs  . Dysrhythmia    SVT  . Gait disturbance   . GERD (gastroesophageal reflux disease)   . 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  Marland Kitchen History of ankle surgery   . History of brain tumor   . Hyperlipidemia   . Hypertension   . 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 Memory loss   . On home oxygen therapy 08/26/2018   at night while lying down  . Peripheral vision loss    Left due to brain surgery  . Pneumonia   . Polycythemia   . Seizures ([place]) 11/19/2013   none since 2016  . Shortness of breath    Hx of smoking.  Marland Kitchen SVT (supraventricular tachycardia) ([place])   . Wears dentures   . Wears glasses     Family History  Problem Relation Age of Onset  . Hypertension Mother   . Bronchitis Mother   . Cancer Maternal Aunt   . Cancer Maternal Grandmother   . Healthy Sister   . Healthy Brother   . Stroke Neg Hx   . Diabetes Neg Hx      Past Surgical History:  Procedure Laterality Date  . ABDOMINAL AORTOGRAM W/LOWER EXTREMITY N/A 05/30/2017   Procedure: ABDOMINAL AORTOGRAM W/LOWER EXTREMITY;  Surgeon: Elam Dutch, MD;  Location: [facility];  Service: Cardiovascular;  Laterality: N/A;  . ABDOMINAL AORTOGRAM W/LOWER EXTREMITY N/A 07/12/2020   Procedure: ABDOMINAL AORTOGRAM W/LOWER EXTREMITY;  Surgeon: [name], MD;  Location: [facility];  Service: Cardiovascular;  Laterality: N/A;  . ANKLE ARTHROSCOPY  01/17/2012  Procedure: ANKLE ARTHROSCOPY;  Surgeon: [name], MD;  Location: [facility];  Service: Orthopedics;  Laterality: Left;  . ANKLE ARTHROSCOPY WITH FUSION Left 10/02/2016   Procedure: Left Posterior Subtalar Arthrodesis Arthroscopy;  Surgeon: [name], MD;  Location: [facility];  Service: Orthopedics;  Laterality: Left;  . ANKLE FUSION Left 11/2012   Dr Sharol Given  . ANKLE FUSION Right 08/09/2020   Procedure: RIGHT ANKLE FUSION;  Surgeon: [name], MD;  Location: [facility];  Service: Orthopedics;  Laterality: Right;  . ANKLE SURGERY  2003   Left  . BRAIN SURGERY  2009   Biopsy  . COLONOSCOPY WITH PROPOFOL N/A 03/11/2019   Procedure: COLONOSCOPY WITH PROPOFOL;  Surgeon: Mauri Pole, MD;  [facility];  Service: Endoscopy;  Laterality: N/A;  . FRACTURE SURGERY  2003   Left ankle  . HARDWARE REMOVAL Left 12/08/2012   Procedure: HARDWARE REMOVAL;  Surgeon: [name], MD;  Location: [facility];  Service: Orthopedics;  Laterality: Left;  Removal Deep Hardware, Take Down Non-Union, Revision Internal Fixation Left Ankle  . I & D EXTREMITY Left 01/01/2013   Procedure: IRRIGATION AND DEBRIDEMENT EXTREMITY;  Surgeon: [name], MD;  Location: [facility];  Service: Orthopedics;  Laterality: Left;  Irrigation, Debridement and Placement Antibiotic Beads Left Ankle  . INNER EAR SURGERY    . OPEN REDUCTION INTERNAL FIXATION (ORIF) SCAPHOID WITH DISTAL RADIUS GRAFT Right 02/01/2013    Procedure: OPEN REDUCTION INTERNAL FIXATION (ORIF) RIGHT SCAPHOID FRACTURE WITH DISTAL RADIUS GRAFT;  Surgeon: Schuyler Amor, MD;  Location: [facility];  Service: Orthopedics;  Laterality: Right;  . ORIF ANKLE FRACTURE Left 12/08/2012   Procedure: OPEN REDUCTION INTERNAL FIXATION (ORIF) ANKLE FRACTURE;  Surgeon: [name], MD;  Location: [facility];  Service: Orthopedics;  Laterality: Left;  Removal Deep Hardware, Take Down Non-Union, Revision Internal Fixation Left Ankle  . ORIF ANKLE FRACTURE Right 09/03/2013   Procedure: OPEN REDUCTION INTERNAL FIXATION (ORIF) ANKLE FRACTURE;  Surgeon: [name], MD;  Location: [facility];  Service: Orthopedics;  Laterality: Right;  Open Reduction Internal Fixation Right Fibula  . POLYPECTOMY  03/11/2019   Procedure: POLYPECTOMY;  Surgeon: Mauri Pole, MD;  [facility];  Service: Endoscopy;;  . SCAPHOID FRACTURE SURGERY  02/04/2013  . SHOULDER ARTHROSCOPY  2008   Left  . TYMPANOSTOMY TUBE PLACEMENT  1993   Social History   Occupational History    Employer: NOT EMPLOYED    Comment: Disabled  Tobacco Use  . Smoking status: Current Every Day Smoker    Packs/day: 1.00    Years: 40.00    Pack years: 40.00    Types: Cigarettes  . Smokeless tobacco: Never Used  Vaping Use  . Vaping Use: Never used  Substance and Sexual Activity  . Alcohol use: No    Alcohol/week: 0.0 standard drinks    Comment: quit Oct. 2017(heavy drinker) .In [place]  . Drug use: No  . Sexual activity: Not Currently

## 2020-09-26 ENCOUNTER — Telehealth: Payer: Self-pay | Admitting: Orthopedic Surgery

## 2020-09-26 NOTE — Telephone Encounter (Signed)
Received call from [facility] with [facility] called needing signed orders from Dr Lajoyce Corners for HHPT. Going out to see patient 09/29/2020  The number to contact Marchelle Folks is [phone]

## 2020-09-27 ENCOUNTER — Ambulatory Visit (INDEPENDENT_AMBULATORY_CARE_PROVIDER_SITE_OTHER): Payer: Medicare HMO | Admitting: Physician Assistant

## 2020-09-27 ENCOUNTER — Ambulatory Visit (INDEPENDENT_AMBULATORY_CARE_PROVIDER_SITE_OTHER): Payer: Medicare HMO

## 2020-09-27 ENCOUNTER — Encounter: Payer: Self-pay | Admitting: Physician Assistant

## 2020-09-27 ENCOUNTER — Ambulatory Visit: Payer: Medicare HMO | Admitting: Physician Assistant

## 2020-09-27 DIAGNOSIS — Z981 Arthrodesis status: Secondary | ICD-10-CM | POA: Diagnosis not present

## 2020-09-27 NOTE — Telephone Encounter (Signed)
All orders with [facility] signed.

## 2020-09-27 NOTE — Progress Notes (Signed)
Office Visit Note   Patient: Harold Bailey           Date of Birth: 1966-09-20           MRN: [id number] Visit Date: 09/27/2020              Requested by: Oval Linsey, MD [address] [place],  [place] [address] PCP: Oval Linsey, MD  No chief complaint on file.     HPI: This is a pleasant 55 year old gentleman who is 7 weeks status post right ankle tibial calcaneal arthrodesis.  He is doing well.  He has just started weightbearing on his right side with his cam walker boot he has reduced his tobacco consumption to 1 pack of cigarettes every other day  Assessment & Plan: Visit Diagnoses:  1. H/O ankle fusion     Plan: Patient may continue advancing his weightbearing he may transition to a regular supportive shoe as tolerated.  Physical therapy was offered but the patient states that he cannot afford it.  He is status post left ankle fusion and has exercises that he knows to do.  Quad strengthening exercises were demonstrated to him today.  Follow-up hopefully for final visit in 6 weeks patient was again counseled on tobacco use.  Follow-Up Instructions: No follow-ups on file.   Ortho Exam  Patient is alert, oriented, no adenopathy, well-dressed, normal affect, normal respiratory effort. Right ankle: Well-healed surgical incision.  Swelling is well controlled.  Compartments are soft and nontender.  Ankle range of motion is rigid and was would be expected no evidence of ascending cellulitis foot is warm and pink  Imaging: No results found. No images are attached to the encounter.  Labs: Lab Results  Component Value Date   HGBA1C 6.0 (H) 04/25/2020   HGBA1C 6.3 (H) 02/01/2020   HGBA1C 6.3 (H) 06/10/2017   LABURIC 5.2 04/25/2020   REPTSTATUS 02/23/2020 FINAL 02/21/2020   GRAMSTAIN  05/11/2017    ABUNDANT WBC PRESENT, PREDOMINANTLY PMN FEW GRAM NEGATIVE RODS    CULT  02/21/2020    NO GROWTH Performed at [facility], [address],  [place], [place] [address]    LABORGA METHICILLIN RESISTANT STAPHYLOCOCCUS AUREUS 05/11/2017     Lab Results  Component Value Date   ALBUMIN 4.2 07/31/2020   ALBUMIN 3.8 02/01/2020   ALBUMIN 4.4 05/08/2018   LABURIC 5.2 04/25/2020    Lab Results  Component Value Date   MG 2.0 04/25/2020   MG 2.0 05/28/2017   MG 2.3 05/23/2017   Lab Results  Component Value Date   VD25OH 34.03 04/25/2020   VD25OH 37.9 02/01/2020    No results found for: PREALBUMIN CBC EXTENDED Latest Ref Rng & Units 08/09/2020 07/31/2020 07/12/2020  WBC 4.0 - 10.5 K/uL - 7.7 -  RBC 4.22 - 5.81 Mil/uL - 5.82(H) -  HGB 13.0 - 17.0 g/dL 20.7(H) 19.5 Repeated and verified X2.(HH) 21.8(HH)  HCT 39.0 - 52.0 % 61.0(H) 57.8(H) 64.0(H)  PLT 150.0 - 400.0 K/uL - 172.0 -  NEUTROABS 1.4 - 7.7 K/uL - 4.1 -  LYMPHSABS 0.7 - 4.0 K/uL - 2.6 -     There is no height or weight on file to calculate BMI.  Orders:  Orders Placed This Encounter  Procedures  . XR Ankle Complete Right   No orders of the defined types were placed in this encounter.    Procedures: No procedures performed  Clinical Data: No additional findings.  ROS:  All other systems negative,  except as noted in the HPI. Review of Systems  Objective: Vital Signs: There were no vitals taken for this visit.  Specialty Comments:  No specialty comments available.  PMFS History: Patient Active Problem List   Diagnosis Date Noted  . Arthritis of right ankle 08/09/2020  . Post-traumatic osteoarthritis, right ankle and foot   . Positive colorectal cancer screening using Cologuard test   . Otitis media, left 10/15/2018  . On home oxygen therapy 08/26/2018  . Difficult intravenous access   . Chronic respiratory failure with hypoxia ([place])   . Essential hypertension 05/11/2017  . Plantar fasciitis of left foot 01/14/2017  . Traumatic arthritis of ankle, right 01/07/2017  . H/O ankle fusion 11/14/2016  . Osteoarthritis of subtalar joint, left  10/16/2016  . At risk for adverse drug event 10/08/2016  . History of snoring 10/08/2016  . Subtalar joint instability, left   . 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 History of alcohol abuse 07/02/2016  . COPD exacerbation ([place]) 07/02/2016  . Tobacco dependence 06/06/2015  . Medication overuse headache 02/28/2015  . Localization-related symptomatic epilepsy and epileptic syndromes with complex partial seizures, not intractable, without status epilepticus ([place]) 02/28/2015  . Cognitive impairment 02/28/2015  . Tobacco abuse 02/28/2015  . Brain tumor ([place]) 11/19/2013  . Seizures ([place]) 11/19/2013  . Memory loss   . HA (headache)   . Gait disturbance   . Falls frequently 10/15/2013  . Swelling 09/09/2013  . Rash and nonspecific skin eruption 09/09/2013  . Ankle fracture, lateral malleolus, closed 09/03/2013  . Unspecified vitamin D deficiency 08/12/2013  . Polycythemia   . Memory deficit   . History of drug abuse ([place])   . GERD (gastroesophageal reflux disease)   . S/P tricuspid valve repair   . Arthritis   . Hyperlipidemia   . Prediabetes    Past Medical History:  Diagnosis Date  . Anxiety   . Arthritis    Left ankle, hand  . COPD (chronic obstructive pulmonary disease) ([place])   . COPD (chronic obstructive pulmonary disease) ([place])   . Diabetes mellitus without complication ([place])   . Drug abuse ([place])   . DVT (deep venous thrombosis) ([facility])    Bilateral legs  . Dysrhythmia    SVT  . Gait disturbance   . GERD (gastroesophageal reflux disease)   . 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  Marland Kitchen History of ankle surgery   . History of brain tumor   . Hyperlipidemia   . Hypertension   . 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 Memory loss   . On home oxygen therapy 08/26/2018   at night while lying down  . Peripheral vision loss    Left due to brain surgery  . Pneumonia   . Polycythemia   .  Seizures ([place]) 11/19/2013   none since 2016  . Shortness of breath    Hx of smoking.  Marland Kitchen SVT (supraventricular tachycardia) ([place])   . Wears dentures   . Wears glasses     Family History  Problem Relation Age of Onset  . Hypertension Mother   . Bronchitis Mother   . Cancer Maternal Aunt   . Cancer Maternal Grandmother   . Healthy Sister   . Healthy Brother   . Stroke Neg Hx   . Diabetes Neg Hx     Past Surgical History:  Procedure Laterality Date  . ABDOMINAL AORTOGRAM W/LOWER  EXTREMITY N/A 05/30/2017   Procedure: ABDOMINAL AORTOGRAM W/LOWER EXTREMITY;  Surgeon: Elam Dutch, MD;  Location: [facility];  Service: Cardiovascular;  Laterality: N/A;  . ABDOMINAL AORTOGRAM W/LOWER EXTREMITY N/A 07/12/2020   Procedure: ABDOMINAL AORTOGRAM W/LOWER EXTREMITY;  Surgeon: [name], MD;  Location: [facility];  Service: Cardiovascular;  Laterality: N/A;  . ANKLE ARTHROSCOPY  01/17/2012   Procedure: ANKLE ARTHROSCOPY;  Surgeon: [name], MD;  Location: [facility];  Service: Orthopedics;  Laterality: Left;  . ANKLE ARTHROSCOPY WITH FUSION Left 10/02/2016   Procedure: Left Posterior Subtalar Arthrodesis Arthroscopy;  Surgeon: [name], MD;  Location: [facility];  Service: Orthopedics;  Laterality: Left;  . ANKLE FUSION Left 11/2012   Dr Sharol Given  . ANKLE FUSION Right 08/09/2020   Procedure: RIGHT ANKLE FUSION;  Surgeon: [name], MD;  Location: [facility];  Service: Orthopedics;  Laterality: Right;  . ANKLE SURGERY  2003   Left  . BRAIN SURGERY  2009   Biopsy  . COLONOSCOPY WITH PROPOFOL N/A 03/11/2019   Procedure: COLONOSCOPY WITH PROPOFOL;  Surgeon: Mauri Pole, MD;  [facility];  Service: Endoscopy;  Laterality: N/A;  . FRACTURE SURGERY  2003   Left ankle  . HARDWARE REMOVAL Left 12/08/2012   Procedure: HARDWARE REMOVAL;  Surgeon: [name], MD;  Location: [place];  Service: Orthopedics;  Laterality: Left;  Removal Deep Hardware, Take Down  Non-Union, Revision Internal Fixation Left Ankle  . I & D EXTREMITY Left 01/01/2013   Procedure: IRRIGATION AND DEBRIDEMENT EXTREMITY;  Surgeon: [name], MD;  Location: [facility];  Service: Orthopedics;  Laterality: Left;  Irrigation, Debridement and Placement Antibiotic Beads Left Ankle  . INNER EAR SURGERY    . OPEN REDUCTION INTERNAL FIXATION (ORIF) SCAPHOID WITH DISTAL RADIUS GRAFT Right 02/01/2013   Procedure: OPEN REDUCTION INTERNAL FIXATION (ORIF) RIGHT SCAPHOID FRACTURE WITH DISTAL RADIUS GRAFT;  Surgeon: Schuyler Amor, MD;  Location: [facility];  Service: Orthopedics;  Laterality: Right;  . ORIF ANKLE FRACTURE Left 12/08/2012   Procedure: OPEN REDUCTION INTERNAL FIXATION (ORIF) ANKLE FRACTURE;  Surgeon: [name], MD;  Location: [facility];  Service: Orthopedics;  Laterality: Left;  Removal Deep Hardware, Take Down Non-Union, Revision Internal Fixation Left Ankle  . ORIF ANKLE FRACTURE Right 09/03/2013   Procedure: OPEN REDUCTION INTERNAL FIXATION (ORIF) ANKLE FRACTURE;  Surgeon: [name], MD;  Location: [facility];  Service: Orthopedics;  Laterality: Right;  Open Reduction Internal Fixation Right Fibula  . POLYPECTOMY  03/11/2019   Procedure: POLYPECTOMY;  Surgeon: Mauri Pole, MD;  [facility];  Service: Endoscopy;;  . SCAPHOID FRACTURE SURGERY  02/04/2013  . SHOULDER ARTHROSCOPY  2008   Left  . TYMPANOSTOMY TUBE PLACEMENT  1993   Social History   Occupational History    Employer: NOT EMPLOYED    Comment: Disabled  Tobacco Use  . Smoking status: Current Every Day Smoker    Packs/day: 1.00    Years: 40.00    Pack years: 40.00    Types: Cigarettes  . Smokeless tobacco: Never Used  Vaping Use  . Vaping Use: Never used  Substance and Sexual Activity  . Alcohol use: No    Alcohol/week: 0.0 standard drinks    Comment: quit Oct. 2017(heavy drinker) .In [place]  . Drug use: No  . Sexual activity: Not Currently

## 2020-10-02 ENCOUNTER — Telehealth: Payer: Self-pay

## 2020-10-02 NOTE — Telephone Encounter (Signed)
Called and gave verbal ok for orders as requested below. To call with any questions. Lm on vm

## 2020-10-02 NOTE — Telephone Encounter (Signed)
[facility] called he is requesting a order for care for 1w5 and 1 every 2 weeks for 4 weeks for endurance and balance [phone]

## 2020-10-03 ENCOUNTER — Other Ambulatory Visit: Payer: Self-pay | Admitting: Neurology

## 2020-10-05 ENCOUNTER — Other Ambulatory Visit: Payer: Self-pay | Admitting: Neurology

## 2020-11-09 ENCOUNTER — Ambulatory Visit (INDEPENDENT_AMBULATORY_CARE_PROVIDER_SITE_OTHER): Payer: Medicare HMO | Admitting: Physician Assistant

## 2020-11-09 ENCOUNTER — Encounter: Payer: Self-pay | Admitting: Orthopedic Surgery

## 2020-11-09 DIAGNOSIS — M19171 Post-traumatic osteoarthritis, right ankle and foot: Secondary | ICD-10-CM

## 2020-11-09 NOTE — Progress Notes (Signed)
Office Visit Note   Patient: Harold Bailey           Date of Birth: [birth year]           MRN: [id number] Visit Date: 11/09/2020              Requested by: Lucia Gaskins, MD [address] [place],  [place] [address] PCP: Lucia Gaskins, MD  Chief Complaint  Patient presents with  . Right Ankle - Pain      HPI: Patient is 3 months status post right ankle fusion.  He is accompanied by his wife.  He is doing okay.  He is currently working with his neurologist as he is having some problem with shaking and has a history of a brain tumor.  His only complaint is burning on the bottom of his foot at night.  It feels better when the covers are not on it.  He currently takes gabapentin 300 mg twice daily.  Assessment & Plan: Visit Diagnoses: No diagnosis found.  Plan: He really does not want to increase his gabapentin as he is already quite sleepy during the day.  I have talked to him about ways he can do desensitization which hopefully will help with some of his issues.  Follow-up in 6 weeks  Follow-Up Instructions: No follow-ups on file.   Ortho Exam  Patient is alert, oriented, no adenopathy, well-dressed, normal affect, normal respiratory effort. Examination of his right ankle demonstrates well-healed surgical incision.  Swelling is well controlled toes are warm and pink with brisk capillary refill.  Compartments are soft and compressible.  No signs of cellulitis or infection  Imaging: No results found. No images are attached to the encounter.  Labs: Lab Results  Component Value Date   HGBA1C 6.0 (H) 04/25/2020   HGBA1C 6.3 (H) 02/01/2020   HGBA1C 6.3 (H) 06/10/2017   LABURIC 5.2 04/25/2020   REPTSTATUS 02/23/2020 FINAL 02/21/2020   GRAMSTAIN  05/11/2017    ABUNDANT WBC PRESENT, PREDOMINANTLY PMN FEW GRAM NEGATIVE RODS    CULT  02/21/2020    NO GROWTH Performed at [facility], [place] [address] [place], [place] [address]    LABORGA METHICILLIN  RESISTANT STAPHYLOCOCCUS AUREUS 05/11/2017     Lab Results  Component Value Date   ALBUMIN 4.2 07/31/2020   ALBUMIN 3.8 02/01/2020   ALBUMIN 4.4 05/08/2018   LABURIC 5.2 04/25/2020    Lab Results  Component Value Date   MG 2.0 04/25/2020   MG 2.0 05/28/2017   MG 2.3 05/23/2017   Lab Results  Component Value Date   VD25OH 34.03 04/25/2020   VD25OH 37.9 02/01/2020    No results found for: PREALBUMIN CBC EXTENDED Latest Ref Rng & Units 08/09/2020 07/31/2020 07/12/2020  WBC 4.0 - 10.5 K/uL - 7.7 -  RBC 4.22 - 5.81 Mil/uL - 5.82(H) -  HGB 13.0 - 17.0 g/dL 20.7(H) 19.5 Repeated and verified X2.(HH) 21.8(HH)  HCT 39.0 - 52.0 % 61.0(H) 57.8(H) 64.0(H)  PLT 150.0 - 400.0 K/uL - 172.0 -  NEUTROABS 1.4 - 7.7 K/uL - 4.1 -  LYMPHSABS 0.7 - 4.0 K/uL - 2.6 -     There is no height or weight on file to calculate BMI.  Orders:  No orders of the defined types were placed in this encounter.  No orders of the defined types were placed in this encounter.    Procedures: No procedures performed  Clinical Data: No additional findings.  ROS:  All other systems negative,  except as noted in the HPI. Review of Systems  Objective: Vital Signs: There were no vitals taken for this visit.  Specialty Comments:  No specialty comments available.  PMFS History: Patient Active Problem List   Diagnosis Date Noted  . Arthritis of right ankle 08/09/2020  . Post-traumatic osteoarthritis, right ankle and foot   . Positive colorectal cancer screening using Cologuard test   . Otitis media, left 10/15/2018  . On home oxygen therapy 08/26/2018  . Difficult intravenous access   . Chronic respiratory failure with hypoxia ([place])   . Essential hypertension 05/11/2017  . Plantar fasciitis of left foot 01/14/2017  . Traumatic arthritis of ankle, right 01/07/2017  . H/O ankle fusion 11/14/2016  . 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 At risk for adverse drug event 10/08/2016  .  History of snoring 10/08/2016  . Subtalar joint instability, left   . 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 History of alcohol abuse 07/02/2016  . COPD exacerbation ([place]) 07/02/2016  . Tobacco dependence 06/06/2015  . Medication overuse headache 02/28/2015  . Localization-related symptomatic epilepsy and epileptic syndromes with complex partial seizures, not intractable, without status epilepticus ([place]) 02/28/2015  . Cognitive impairment 02/28/2015  . Tobacco abuse 02/28/2015  . Brain tumor ([place]) 11/19/2013  . Seizures ([place]) 11/19/2013  . Memory loss   . HA (headache)   . Gait disturbance   . Falls frequently 10/15/2013  . Swelling 09/09/2013  . Rash and nonspecific skin eruption 09/09/2013  . Ankle fracture, lateral malleolus, closed 09/03/2013  . Unspecified vitamin D deficiency 08/12/2013  . Polycythemia   . Memory deficit   . History of drug abuse ([place])   . GERD (gastroesophageal reflux disease)   . S/P tricuspid valve repair   . Arthritis   . Hyperlipidemia   . Prediabetes    Past Medical History:  Diagnosis Date  . Anxiety   . Arthritis    Left ankle, hand  . COPD (chronic obstructive pulmonary disease) ([place])   . COPD (chronic obstructive pulmonary disease) ([place])   . Diabetes mellitus without complication ([place])   . Drug abuse ([place])   . DVT (deep venous thrombosis) ([facility])    Bilateral legs  . Dysrhythmia    SVT  . Gait disturbance   . GERD (gastroesophageal reflux disease)   . 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  Marland Kitchen History of ankle surgery   . History of brain tumor   . Hyperlipidemia   . Hypertension   . 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 Memory loss   . On home oxygen therapy 08/26/2018   at night while lying down  . Peripheral vision loss    Left due to brain surgery  . Pneumonia   . Polycythemia   . Seizures ([place]) 11/19/2013   none since 2016  . Shortness of  breath    Hx of smoking.  Marland Kitchen SVT (supraventricular tachycardia) ([place])   . Wears dentures   . Wears glasses     Family History  Problem Relation Age of Onset  . Hypertension Mother   . Bronchitis Mother   . Cancer Maternal Aunt   . Cancer Maternal Grandmother   . Healthy Sister   . Healthy Brother   . Stroke Neg Hx   . Diabetes Neg Hx     Past Surgical History:  Procedure Laterality Date  . ABDOMINAL AORTOGRAM W/LOWER  EXTREMITY N/A 05/30/2017   Procedure: ABDOMINAL AORTOGRAM W/LOWER EXTREMITY;  Surgeon: Elam Dutch, MD;  Location: [facility];  Service: Cardiovascular;  Laterality: N/A;  . ABDOMINAL AORTOGRAM W/LOWER EXTREMITY N/A 07/12/2020   Procedure: ABDOMINAL AORTOGRAM W/LOWER EXTREMITY;  Surgeon: [name], MD;  Location: [facility];  Service: Cardiovascular;  Laterality: N/A;  . ANKLE ARTHROSCOPY  01/17/2012   Procedure: ANKLE ARTHROSCOPY;  Surgeon: [name], MD;  Location: [facility];  Service: Orthopedics;  Laterality: Left;  . ANKLE ARTHROSCOPY WITH FUSION Left 10/02/2016   Procedure: Left Posterior Subtalar Arthrodesis Arthroscopy;  Surgeon: [name], MD;  Location: [facility];  Service: Orthopedics;  Laterality: Left;  . ANKLE FUSION Left 11/2012   Dr Sharol Given  . ANKLE FUSION Right 08/09/2020   Procedure: RIGHT ANKLE FUSION;  Surgeon: [name], MD;  Location: [facility];  Service: Orthopedics;  Laterality: Right;  . ANKLE SURGERY  2003   Left  . BRAIN SURGERY  2009   Biopsy  . COLONOSCOPY WITH PROPOFOL N/A 03/11/2019   Procedure: COLONOSCOPY WITH PROPOFOL;  Surgeon: Mauri Pole, MD;  Location: [facility];  Service: Endoscopy;  Laterality: N/A;  . FRACTURE SURGERY  2003   Left ankle  . HARDWARE REMOVAL Left 12/08/2012   Procedure: HARDWARE REMOVAL;  Surgeon: [name], MD;  Location: [facility];  Service: Orthopedics;  Laterality: Left;  Removal Deep Hardware, Take Down Non-Union, Revision Internal Fixation Left Ankle  . I & D EXTREMITY  Left 01/01/2013   Procedure: IRRIGATION AND DEBRIDEMENT EXTREMITY;  Surgeon: [name], MD;  Location: [facility];  Service: Orthopedics;  Laterality: Left;  Irrigation, Debridement and Placement Antibiotic Beads Left Ankle  . INNER EAR SURGERY    . OPEN REDUCTION INTERNAL FIXATION (ORIF) SCAPHOID WITH DISTAL RADIUS GRAFT Right 02/01/2013   Procedure: OPEN REDUCTION INTERNAL FIXATION (ORIF) RIGHT SCAPHOID FRACTURE WITH DISTAL RADIUS GRAFT;  Surgeon: Schuyler Amor, MD;  Location: [facility];  Service: Orthopedics;  Laterality: Right;  . ORIF ANKLE FRACTURE Left 12/08/2012   Procedure: OPEN REDUCTION INTERNAL FIXATION (ORIF) ANKLE FRACTURE;  Surgeon: [name], MD;  Location: [facility];  Service: Orthopedics;  Laterality: Left;  Removal Deep Hardware, Take Down Non-Union, Revision Internal Fixation Left Ankle  . ORIF ANKLE FRACTURE Right 09/03/2013   Procedure: OPEN REDUCTION INTERNAL FIXATION (ORIF) ANKLE FRACTURE;  Surgeon: [name], MD;  Location: [facility];  Service: Orthopedics;  Laterality: Right;  Open Reduction Internal Fixation Right Fibula  . POLYPECTOMY  03/11/2019   Procedure: POLYPECTOMY;  Surgeon: Mauri Pole, MD;  Location: [facility];  Service: Endoscopy;;  . SCAPHOID FRACTURE SURGERY  02/04/2013  . SHOULDER ARTHROSCOPY  2008   Left  . TYMPANOSTOMY TUBE PLACEMENT  1993   Social History   Occupational History    Employer: NOT EMPLOYED    Comment: Disabled  Tobacco Use  . Smoking status: Current Every Day Smoker    Packs/day: 1.00    Years: 40.00    Pack years: 40.00    Types: Cigarettes  . Smokeless tobacco: Never Used  Vaping Use  . Vaping Use: Never used  Substance and Sexual Activity  . Alcohol use: No    Alcohol/week: 0.0 standard drinks    Comment: quit Oct. 2017(heavy drinker) .In [place]  . Drug use: No  . Sexual activity: Not Currently

## 2020-11-13 ENCOUNTER — Other Ambulatory Visit (HOSPITAL_COMMUNITY)
Admission: RE | Admit: 2020-11-13 | Discharge: 2020-11-13 | Disposition: A | Discharge status: Home / Self Care | Payer: Medicare HMO | Source: Ambulatory Visit | Attending: Internal Medicine | Admitting: Internal Medicine

## 2020-11-13 DIAGNOSIS — R5383 Other fatigue: Secondary | ICD-10-CM | POA: Insufficient documentation

## 2020-11-13 DIAGNOSIS — E1165 Type 2 diabetes mellitus with hyperglycemia: Secondary | ICD-10-CM | POA: Insufficient documentation

## 2020-11-13 DIAGNOSIS — E7849 Other hyperlipidemia: Secondary | ICD-10-CM | POA: Insufficient documentation

## 2020-11-13 DIAGNOSIS — D513 Other dietary vitamin B12 deficiency anemia: Secondary | ICD-10-CM | POA: Insufficient documentation

## 2020-11-13 DIAGNOSIS — R972 Elevated prostate specific antigen [PSA]: Secondary | ICD-10-CM | POA: Insufficient documentation

## 2020-11-13 DIAGNOSIS — I110 Hypertensive heart disease with heart failure: Secondary | ICD-10-CM | POA: Insufficient documentation

## 2020-11-13 DIAGNOSIS — D510 Vitamin B12 deficiency anemia due to intrinsic factor deficiency: Secondary | ICD-10-CM | POA: Insufficient documentation

## 2020-11-13 DIAGNOSIS — E559 Vitamin D deficiency, unspecified: Secondary | ICD-10-CM | POA: Insufficient documentation

## 2020-11-17 ENCOUNTER — Ambulatory Visit: Payer: Medicare HMO | Admitting: Pulmonary Disease

## 2020-11-22 ENCOUNTER — Ambulatory Visit: Payer: Medicare HMO | Admitting: Pulmonary Disease

## 2020-12-14 ENCOUNTER — Other Ambulatory Visit: Payer: Self-pay

## 2020-12-14 ENCOUNTER — Encounter: Payer: Self-pay | Admitting: Pulmonary Disease

## 2020-12-14 ENCOUNTER — Ambulatory Visit (INDEPENDENT_AMBULATORY_CARE_PROVIDER_SITE_OTHER): Payer: Medicare HMO | Admitting: Pulmonary Disease

## 2020-12-14 DIAGNOSIS — Z720 Tobacco use: Secondary | ICD-10-CM

## 2020-12-14 DIAGNOSIS — J9611 Chronic respiratory failure with hypoxia: Secondary | ICD-10-CM

## 2020-12-14 DIAGNOSIS — J441 Chronic obstructive pulmonary disease with (acute) exacerbation: Secondary | ICD-10-CM | POA: Diagnosis not present

## 2020-12-14 NOTE — Patient Instructions (Signed)
  Continue on Trelegy. Use oxygen 24/7. Enroll in lung cancer screening program Try to cut down cigarettes to half pack per day

## 2020-12-14 NOTE — Progress Notes (Signed)
Subjective:    Patient ID: Harold Bailey, male    DOB: July 20, 1966, 55 y.o.   MRN: [id number]  HPI  55 yo smoker for FU of COPD & chronic hypoxic resp failure.  04/2017 Hosp  for 2 months for chronic respiratory failure, MRSA pneumonia, C. difficile colitis and tracheostomy due to prolonged mechanical ventilation. He was discharged on oxygen about 4 L at daytime and 2 L during sleep.  PMH -substance abuse and was in [place] for rehab and went through withdrawal of pain medications in 09/2017. He is currently maintained on Xanax 1 mg thrice daily.BIpolar &  seizure disorder on 2 anticonvulsants  He used to smoke about 2 to 4 packs/day but now has cut down to 1 packs/day   Last OV 11/2018 >> trelegy ,pulm rehab He presents for follow-up after 2 years, accompanied by his wife who corroborates history.  He admits to using oxygen when he is at home but when he goes outside, he does not take oxygen with him.  He continues to smoke 1 pack/day.  He asked me about whether he can work part-time 20 hours a week and whether he can be without oxygen for that.Marland Kitchen He is compliant with Trelegy. He does not feel that albuterol does much for him.  He wonders if he needs a chest x-ray today  Significant tests/ events reviewed  Chest x-ray 06/12/2017 and 07/12/2016 - hyperinflated lungs Spirometry 09/2018 severe airway obstruction with ratio of 67, FEV1 of 39% FVC of 45%   Past Medical History:  Diagnosis Date  . Anxiety   . Arthritis    Left ankle, hand  . COPD (chronic obstructive pulmonary disease) ([facility])   . COPD (chronic obstructive pulmonary disease) ([facility])   . Diabetes mellitus without complication ([place])   . Drug abuse ([place])   . DVT (deep venous thrombosis) ([facility])    Bilateral legs  . Dysrhythmia    SVT  . Gait disturbance   . GERD (gastroesophageal reflux disease)   . 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  Marland Kitchen History of  ankle surgery   . History of brain tumor   . Hyperlipidemia   . Hypertension   . 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 Memory loss   . On home oxygen therapy 08/26/2018   at night while lying down  . Peripheral vision loss    Left due to brain surgery  . Pneumonia   . Polycythemia   . Seizures ([facility]) 11/19/2013   none since 2016  . Shortness of breath    Hx of smoking.  Marland Kitchen SVT (supraventricular tachycardia) ([facility])   . Wears dentures   . Wears glasses      Review of Systems neg for any significant sore throat, dysphagia, itching, sneezing, nasal congestion or excess/ purulent secretions, fever, chills, sweats, unintended wt loss, pleuritic or exertional cp, hempoptysis, orthopnea pnd or change in chronic leg swelling. Also denies presyncope, palpitations, heartburn, abdominal pain, nausea, vomiting, diarrhea or change in bowel or urinary habits, dysuria,hematuria, rash, arthralgias, visual complaints, headache, numbness weakness or ataxia.     Objective:   Physical Exam  Gen. Pleasant, well-nourished, in no distress, anxious affect ENT - no thrush, no pallor/icterus,no post nasal drip Neck: No JVD, no thyromegaly, no carotid bruits Lungs: no use of accessory muscles, no dullness to percussion, decreased BS BL without rales or rhonchi  Cardiovascular: Rhythm regular, heart sounds  normal, no murmurs or gallops, no peripheral edema Musculoskeletal: No deformities, no cyanosis or clubbing        Assessment & Plan:

## 2020-12-14 NOTE — Assessment & Plan Note (Signed)
His oxygen saturation was 88% on room air.  He needs to use oxygen 24/7 and emphasized this to him. He will qualify for portable oxygen He will qualify for pulmonary rehab but he is not interested at the current time

## 2020-12-14 NOTE — Assessment & Plan Note (Signed)
Emphasized smoking cessation.  He feels he has done well by cutting down from 4 packs to 1 pack daily.  I challenged him to cut this down to half pack per day until our next visit.  He was unable to commit to a quit attempt  We will enroll him in lung cancer screening program

## 2020-12-14 NOTE — Assessment & Plan Note (Signed)
Continue on Trelegy. He may benefit from a spacer, advised him how to take albuterol MDI

## 2020-12-21 ENCOUNTER — Ambulatory Visit: Payer: Medicare HMO | Admitting: Orthopedic Surgery

## 2020-12-22 ENCOUNTER — Ambulatory Visit: Payer: Medicare HMO | Admitting: Physician Assistant

## 2020-12-28 ENCOUNTER — Ambulatory Visit: Payer: Medicare HMO | Admitting: Orthopedic Surgery

## 2021-01-01 ENCOUNTER — Ambulatory Visit: Payer: Medicare HMO | Admitting: Orthopedic Surgery

## 2021-01-04 ENCOUNTER — Ambulatory Visit: Payer: Medicare HMO | Admitting: Orthopedic Surgery

## 2021-01-07 ENCOUNTER — Other Ambulatory Visit: Payer: Self-pay | Admitting: Neurology

## 2021-01-08 ENCOUNTER — Ambulatory Visit (INDEPENDENT_AMBULATORY_CARE_PROVIDER_SITE_OTHER): Payer: Medicare HMO

## 2021-01-08 ENCOUNTER — Ambulatory Visit (INDEPENDENT_AMBULATORY_CARE_PROVIDER_SITE_OTHER): Payer: Medicare HMO | Admitting: Physician Assistant

## 2021-01-08 ENCOUNTER — Other Ambulatory Visit: Payer: Self-pay | Admitting: Neurology

## 2021-01-08 ENCOUNTER — Ambulatory Visit: Payer: Medicare HMO | Admitting: Orthopaedic Surgery

## 2021-01-08 ENCOUNTER — Encounter: Payer: Self-pay | Admitting: Orthopedic Surgery

## 2021-01-08 DIAGNOSIS — G8929 Other chronic pain: Secondary | ICD-10-CM

## 2021-01-08 DIAGNOSIS — M79672 Pain in left foot: Secondary | ICD-10-CM

## 2021-01-08 DIAGNOSIS — M25571 Pain in right ankle and joints of right foot: Secondary | ICD-10-CM | POA: Diagnosis not present

## 2021-01-08 MED ORDER — LIDOCAINE HCL 1 % IJ SOLN
2.0000 mL | INTRAMUSCULAR | Status: AC | PRN
  Administered 2021-01-08: 2 mL

## 2021-01-08 MED ORDER — METHYLPREDNISOLONE ACETATE 40 MG/ML IJ SUSP
40.0000 mg | INTRAMUSCULAR | Status: AC | PRN
  Administered 2021-01-08: 40 mg

## 2021-01-08 NOTE — Progress Notes (Signed)
Office Visit Note   Patient: Harold Bailey           Date of Birth: 05/27/1966           MRN: [id number] Visit Date: 01/08/2021              Requested by: Lucia Gaskins, MD [address] [place],  [place] [address] PCP: Lucia Gaskins, MD  Chief Complaint  Patient presents with  . Right Ankle - Follow-up    08/09/20 right ankle fusion   . Left Foot - Pain      HPI: This is a pleasant 55 year old gentleman who is 5 months status post right ankle fusion.  With regards to this he is wearing a regular shoe and doing fairly well.  He is complaining of left plantar foot pain.  This is in the heel.  He says Dr. Sharol Given has injected this in the past and he would like an injection today  Assessment & Plan: Visit Diagnoses:  1. Pain in right ankle and joints of right foot   2. Chronic heel pain, left     Plan: Patient will follow-up in 2 months.  I have recommended some gel heel pads for both sides if needed.  Follow-Up Instructions: No follow-ups on file.   Ortho Exam  Patient is alert, oriented, no adenopathy, well-dressed, normal affect, normal respiratory effort. Examination of his right ankle demonstrates well-healed surgical incision ankle range of motion is rigid.  No erythema no ascending cellulitis no tenderness to palpation  Left heel he has tenderness over the plantar heel surface of the heel.  There is no surrounding cellulitis erythema or signs of infection  Imaging: No results found. No images are attached to the encounter.  Labs: Lab Results  Component Value Date   HGBA1C 6.0 (H) 04/25/2020   HGBA1C 6.3 (H) 02/01/2020   HGBA1C 6.3 (H) 06/10/2017   LABURIC 5.2 04/25/2020   REPTSTATUS 02/23/2020 FINAL 02/21/2020   GRAMSTAIN  05/11/2017    ABUNDANT WBC PRESENT, PREDOMINANTLY PMN FEW GRAM NEGATIVE RODS    CULT  02/21/2020    NO GROWTH Performed at [facility], [place] [address] [place],  [address]    LABORGA METHICILLIN RESISTANT  STAPHYLOCOCCUS AUREUS 05/11/2017     Lab Results  Component Value Date   ALBUMIN 4.2 07/31/2020   ALBUMIN 3.8 02/01/2020   ALBUMIN 4.4 05/08/2018    Lab Results  Component Value Date   MG 2.0 04/25/2020   MG 2.0 05/28/2017   MG 2.3 05/23/2017   Lab Results  Component Value Date   VD25OH 34.03 04/25/2020   VD25OH 37.9 02/01/2020    No results found for: PREALBUMIN CBC EXTENDED Latest Ref Rng & Units 08/09/2020 07/31/2020 07/12/2020  WBC 4.0 - 10.5 K/uL - 7.7 -  RBC 4.22 - 5.81 Mil/uL - 5.82(H) -  HGB 13.0 - 17.0 g/dL 20.7(H) 19.5 Repeated and verified X2.(HH) 21.8(HH)  HCT 39.0 - 52.0 % 61.0(H) 57.8(H) 64.0(H)  PLT 150.0 - 400.0 K/uL - 172.0 -  NEUTROABS 1.4 - 7.7 K/uL - 4.1 -  LYMPHSABS 0.7 - 4.0 K/uL - 2.6 -     There is no height or weight on file to calculate BMI.  Orders:  Orders Placed This Encounter  Procedures  . XR Ankle Complete Right  . XR Os Calcis Left   No orders of the defined types were placed in this encounter.    Procedures: Foot Inj  Date/Time: 01/08/2021 4:36 PM Performed  by: Emmalynne Courtney, Bevely Palmer, PA Authorized by: Nashia Remus, Bevely Palmer, [place]   Consent Given by:  Patient Site marked: the procedure site was marked   Timeout: prior to procedure the correct patient, procedure, and site was verified   Indications:  Fasciitis and pain Condition: Plantar Fasciitis   Location: left plantar fascia muscle   Prep: patient was prepped and draped in usual sterile fashion   Needle Size:  22 G Medications:  2 mL lidocaine 1 %; 40 mg methylPREDNISolone acetate 40 MG/ML Patient Tolerance:  Patient tolerated the procedure well with no immediate complications    Clinical Data: No additional findings.  ROS:  All other systems negative, except as noted in the HPI. Review of Systems  Objective: Vital Signs: There were no vitals taken for this visit.  Specialty Comments:  No specialty comments available.  PMFS History: Patient Active Problem List    Diagnosis Date Noted  . Arthritis of right ankle 08/09/2020  . Post-traumatic osteoarthritis, right ankle and foot   . Positive colorectal cancer screening using Cologuard test   . Otitis media, left 10/15/2018  . On home oxygen therapy 08/26/2018  . Difficult intravenous access   . Chronic respiratory failure with hypoxia ([place])   . Essential hypertension 05/11/2017  . Plantar fasciitis of left foot 01/14/2017  . Traumatic arthritis of ankle, right 01/07/2017  . H/O ankle fusion 11/14/2016  . 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 At risk for adverse drug event 10/08/2016  . History of snoring 10/08/2016  . Subtalar joint instability, left   . 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 History of alcohol abuse 07/02/2016  . COPD exacerbation ([place]) 07/02/2016  . Tobacco dependence 06/06/2015  . Medication overuse headache 02/28/2015  . Localization-related symptomatic epilepsy and epileptic syndromes with complex partial seizures, not intractable, without status epilepticus ([place]) 02/28/2015  . Cognitive impairment 02/28/2015  . Tobacco abuse 02/28/2015  . Brain tumor ([place]) 11/19/2013  . Seizures ([place]) 11/19/2013  . Memory loss   . HA (headache)   . Gait disturbance   . Falls frequently 10/15/2013  . Swelling 09/09/2013  . Rash and nonspecific skin eruption 09/09/2013  . Ankle fracture, lateral malleolus, closed 09/03/2013  . Unspecified vitamin D deficiency 08/12/2013  . Polycythemia   . Memory deficit   . History of drug abuse ([place])   . GERD (gastroesophageal reflux disease)   . S/P tricuspid valve repair   . Arthritis   . Hyperlipidemia   . Prediabetes    Past Medical History:  Diagnosis Date  . Anxiety   . Arthritis    Left ankle, hand  . COPD (chronic obstructive pulmonary disease) ([place])   . COPD (chronic obstructive pulmonary disease) ([place])   . Diabetes mellitus without complication ([place])   . Drug abuse ([place])   . DVT (deep venous  thrombosis) ([facility])    Bilateral legs  . Dysrhythmia    SVT  . Gait disturbance   . GERD (gastroesophageal reflux disease)   . 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  Marland Kitchen History of ankle surgery   . History of brain tumor   . Hyperlipidemia   . Hypertension   . 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 Memory loss   . On home oxygen therapy 08/26/2018   at night while lying down  . Peripheral vision loss    Left due to brain surgery  .  Pneumonia   . Polycythemia   . Seizures ([place]) 11/19/2013   none since 2016  . Shortness of breath    Hx of smoking.  Marland Kitchen SVT (supraventricular tachycardia) ([place])   . Wears dentures   . Wears glasses     Family History  Problem Relation Age of Onset  . Hypertension Mother   . Bronchitis Mother   . Cancer Maternal Aunt   . Cancer Maternal Grandmother   . Healthy Sister   . Healthy Brother   . Stroke Neg Hx   . Diabetes Neg Hx     Past Surgical History:  Procedure Laterality Date  . ABDOMINAL AORTOGRAM W/LOWER EXTREMITY N/A 05/30/2017   Procedure: ABDOMINAL AORTOGRAM W/LOWER EXTREMITY;  Surgeon: Elam Dutch, MD;  Location: [facility];  Service: Cardiovascular;  Laterality: N/A;  . ABDOMINAL AORTOGRAM W/LOWER EXTREMITY N/A 07/12/2020   Procedure: ABDOMINAL AORTOGRAM W/LOWER EXTREMITY;  Surgeon: [name], MD;  Location: [facility];  Service: Cardiovascular;  Laterality: N/A;  . ANKLE ARTHROSCOPY  01/17/2012   Procedure: ANKLE ARTHROSCOPY;  Surgeon: [name], MD;  Location: [facility];  Service: Orthopedics;  Laterality: Left;  . ANKLE ARTHROSCOPY WITH FUSION Left 10/02/2016   Procedure: Left Posterior Subtalar Arthrodesis Arthroscopy;  Surgeon: [name], MD;  Location: [facility];  Service: Orthopedics;  Laterality: Left;  . ANKLE FUSION Left 11/2012   Dr Sharol Given  . ANKLE FUSION Right 08/09/2020   Procedure: RIGHT ANKLE FUSION;  Surgeon: [name], MD;  Location: [facility];  Service: Orthopedics;  Laterality: Right;  . ANKLE SURGERY  2003   Left  . BRAIN SURGERY  2009   Biopsy  . COLONOSCOPY WITH PROPOFOL N/A 03/11/2019   Procedure: COLONOSCOPY WITH PROPOFOL;  Surgeon: Mauri Pole, MD;  Location: [facility];  Service: Endoscopy;  Laterality: N/A;  . FRACTURE SURGERY  2003   Left ankle  . HARDWARE REMOVAL Left 12/08/2012   Procedure: HARDWARE REMOVAL;  Surgeon: [name], MD;  Location: [facility];  Service: Orthopedics;  Laterality: Left;  Removal Deep Hardware, Take Down Non-Union, Revision Internal Fixation Left Ankle  . I & D EXTREMITY Left 01/01/2013   Procedure: IRRIGATION AND DEBRIDEMENT EXTREMITY;  Surgeon: [name], MD;  Location: [facility];  Service: Orthopedics;  Laterality: Left;  Irrigation, Debridement and Placement Antibiotic Beads Left Ankle  . INNER EAR SURGERY    . OPEN REDUCTION INTERNAL FIXATION (ORIF) SCAPHOID WITH DISTAL RADIUS GRAFT Right 02/01/2013   Procedure: OPEN REDUCTION INTERNAL FIXATION (ORIF) RIGHT SCAPHOID FRACTURE WITH DISTAL RADIUS GRAFT;  Surgeon: Schuyler Amor, MD;  Location: [facility];  Service: Orthopedics;  Laterality: Right;  . ORIF ANKLE FRACTURE Left 12/08/2012   Procedure: OPEN REDUCTION INTERNAL FIXATION (ORIF) ANKLE FRACTURE;  Surgeon: [name], MD;  Location: [facility];  Service: Orthopedics;  Laterality: Left;  Removal Deep Hardware, Take Down Non-Union, Revision Internal Fixation Left Ankle  . ORIF ANKLE FRACTURE Right 09/03/2013   Procedure: OPEN REDUCTION INTERNAL FIXATION (ORIF) ANKLE FRACTURE;  Surgeon: [name], MD;  Location: [facility];  Service: Orthopedics;  Laterality: Right;  Open Reduction Internal Fixation Right Fibula  . POLYPECTOMY  03/11/2019   Procedure: POLYPECTOMY;  Surgeon: Mauri Pole, MD;  [facility];  Service: Endoscopy;;  . SCAPHOID FRACTURE SURGERY  02/04/2013  . SHOULDER ARTHROSCOPY  2008   Left  . TYMPANOSTOMY [place]    Social History   Occupational History  Employer: NOT EMPLOYED    Comment: Disabled  Tobacco Use  . Smoking status: Current Every Day Smoker    Packs/day: 1.00    Years: 40.00    Pack years: 40.00    Types: Cigarettes  . Smokeless tobacco: Never Used  . Tobacco comment: 20 cigarettes smoked daily 12/14/20 [facility]   Vaping Use  . Vaping Use: Never used  Substance and Sexual Activity  . Alcohol use: No    Alcohol/week: 0.0 standard drinks    Comment: quit Oct. 2017(heavy drinker) .In [place]  . Drug use: No  . Sexual activity: Not Currently
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
## 2021-01-29 ENCOUNTER — Other Ambulatory Visit (INDEPENDENT_AMBULATORY_CARE_PROVIDER_SITE_OTHER): Payer: Medicare HMO

## 2021-01-29 ENCOUNTER — Other Ambulatory Visit: Payer: Self-pay

## 2021-01-29 ENCOUNTER — Encounter: Payer: Self-pay | Admitting: Neurology

## 2021-01-29 ENCOUNTER — Ambulatory Visit (INDEPENDENT_AMBULATORY_CARE_PROVIDER_SITE_OTHER): Payer: Medicare HMO | Admitting: Neurology

## 2021-01-29 VITALS — BP 107/74 | HR 90 | Ht 72.0 in | Wt 240.4 lb

## 2021-01-29 DIAGNOSIS — G8929 Other chronic pain: Secondary | ICD-10-CM

## 2021-01-29 DIAGNOSIS — R4189 Other symptoms and signs involving cognitive functions and awareness: Secondary | ICD-10-CM

## 2021-01-29 DIAGNOSIS — G40209 Localization-related (focal) (partial) symptomatic epilepsy and epileptic syndromes with complex partial seizures, not intractable, without status epilepticus: Secondary | ICD-10-CM | POA: Diagnosis not present

## 2021-01-29 DIAGNOSIS — R251 Tremor, unspecified: Secondary | ICD-10-CM

## 2021-01-29 DIAGNOSIS — G0490 Encephalitis and encephalomyelitis, unspecified: Secondary | ICD-10-CM

## 2021-01-29 DIAGNOSIS — M792 Neuralgia and neuritis, unspecified: Secondary | ICD-10-CM | POA: Diagnosis not present

## 2021-01-29 DIAGNOSIS — R519 Headache, unspecified: Secondary | ICD-10-CM

## 2021-01-29 LAB — COMPREHENSIVE METABOLIC PANEL
ALT: 18 U/L (ref 0–53)
AST: 20 U/L (ref 0–37)
Albumin: 4.5 g/dL (ref 3.5–5.2)
Alkaline Phosphatase: 65 U/L (ref 39–117)
BUN: 12 mg/dL (ref 6–23)
CO2: 32 mEq/L (ref 19–32)
Calcium: 9.7 mg/dL (ref 8.4–10.5)
Chloride: 97 mEq/L (ref 96–112)
Creatinine, Ser: 0.84 mg/dL (ref 0.40–1.50)
GFR: 97.68 mL/min (ref >60.00)
Glucose, Bld: 97 mg/dL (ref 70–99)
Potassium: 5 mEq/L (ref 3.5–5.1)
Sodium: 134 mEq/L — ABNORMAL LOW (ref 135–145)
Total Bilirubin: 0.3 mg/dL (ref 0.2–1.2)
Total Protein: 7.4 g/dL (ref 6.0–8.3)

## 2021-01-29 LAB — CBC
HCT: 52.7 % — ABNORMAL HIGH (ref 39.0–52.0)
Hemoglobin: 17.8 g/dL — ABNORMAL HIGH (ref 13.0–17.0)
MCHC: 33.9 g/dL (ref 30.0–36.0)
MCV: 99.2 fl (ref 78.0–100.0)
Platelets: 200 10*3/uL (ref 150.0–400.0)
RBC: 5.31 Mil/uL (ref 4.22–5.81)
RDW: 14.1 % (ref 11.5–15.5)
WBC: 8.3 10*3/uL (ref 4.0–10.5)

## 2021-01-29 MED ORDER — LEVETIRACETAM 500 MG PO TABS: 500.0000 mg | ORAL_TABLET | Freq: Two times a day (BID) | ORAL | 5 refills | Status: DC

## 2021-01-29 NOTE — Patient Instructions (Addendum)
1.  Start levetiracetam 500mg  twice daily 2.  Decrease divalproex to 1 pill daily for 1 week then stop 3.  Continue oxcarbazepine 450mg  twice daily, gabapentin 300mg  twice daily and amitriptyline 75mg  at bedtime 4.  Contact PCP about discontinuing bupropion 5.  Limit use of pain relievers to no more than 2 days out of week to prevent risk of rebound or medication-overuse headache. 6.  Keep headache diary 7.  Follow up 6 months. 8.  Check CBC and CMP

## 2021-02-02 ENCOUNTER — Other Ambulatory Visit: Payer: Self-pay | Admitting: Neurology

## 2021-02-10 ENCOUNTER — Other Ambulatory Visit: Payer: Self-pay | Admitting: Neurology

## 2021-02-16 ENCOUNTER — Other Ambulatory Visit: Payer: Self-pay | Admitting: Neurology

## 2021-02-27 ENCOUNTER — Other Ambulatory Visit: Payer: Self-pay | Admitting: Neurology

## 2021-03-12 ENCOUNTER — Ambulatory Visit: Payer: Medicare HMO | Admitting: Orthopedic Surgery

## 2021-03-28 ENCOUNTER — Other Ambulatory Visit: Payer: Self-pay | Admitting: Neurology

## 2021-03-28 NOTE — Telephone Encounter (Signed)
Please advise 

## 2021-04-27 DIAGNOSIS — Z79891 Long term (current) use of opiate analgesic: Secondary | ICD-10-CM | POA: Insufficient documentation

## 2021-04-27 DIAGNOSIS — Z85841 Personal history of malignant neoplasm of brain: Secondary | ICD-10-CM | POA: Insufficient documentation

## 2021-06-11 ENCOUNTER — Ambulatory Visit: Payer: Medicare HMO | Admitting: Pulmonary Disease

## 2021-06-21 ENCOUNTER — Encounter: Payer: Self-pay | Admitting: Pulmonary Disease

## 2021-06-21 ENCOUNTER — Other Ambulatory Visit: Payer: Self-pay

## 2021-06-21 ENCOUNTER — Ambulatory Visit (INDEPENDENT_AMBULATORY_CARE_PROVIDER_SITE_OTHER): Payer: Medicare HMO | Admitting: Pulmonary Disease

## 2021-06-21 VITALS — HR 89 | Temp 98.3°F | Ht 72.0 in | Wt 225.0 lb

## 2021-06-21 DIAGNOSIS — Z720 Tobacco use: Secondary | ICD-10-CM | POA: Diagnosis not present

## 2021-06-21 DIAGNOSIS — J9611 Chronic respiratory failure with hypoxia: Secondary | ICD-10-CM | POA: Diagnosis not present

## 2021-06-21 DIAGNOSIS — J441 Chronic obstructive pulmonary disease with (acute) exacerbation: Secondary | ICD-10-CM | POA: Diagnosis not present

## 2021-06-21 DIAGNOSIS — Z23 Encounter for immunization: Secondary | ICD-10-CM

## 2021-06-21 MED ORDER — DOXYCYCLINE HYCLATE 100 MG PO TABS: 100.0000 mg | ORAL_TABLET | Freq: Every day | ORAL | 0 refills | Status: DC

## 2021-06-21 NOTE — Patient Instructions (Signed)
Doxycycline 100 mg daily for 7 days. Take Mucinex 600 mg twice daily for sputum production.  Ambulatory saturation. Prescription to DME for portable oxygen concentrator  Low-dose CT chest screening for lung cancer. Congratulations on cutting down cigarettes, you have to stop completely !

## 2021-06-21 NOTE — Progress Notes (Signed)
   Subjective:    Patient ID: Harold Bailey, male    DOB: 09-19-1966, 55 y.o.   MRN: [id number]  HPI  55 yo smoker for FU of COPD & chronic hypoxic resp failure.  04/2017 Hosp  for 2 months for chronic respiratory failure, MRSA pneumonia, C. difficile colitis and tracheostomy due to prolonged mechanical ventilation.  He was discharged on oxygen about 4 L at daytime and 2 L during sleep.     PMH -substance abuse,went through withdrawal of pain medications in 09/2017. On Xanax 1 mg thrice daily.  BIpolar &  seizure disorder on 2 anticonvulsants   He used to smoke about 2 to 4 packs/day, has been able to cut down 1 pack every other day. He does a part-time job working at [employer]  Feels like he needs portable oxygen. Oxygen saturation was 88% on arrival today.  He reports cough productive of yellow sputum, no wheezing, no pedal edema.  Trelegy helps   Significant tests/ events reviewed Spirometry 09/2018 severe airway obstruction with ratio of 67, FEV1 of 39% FVC of 45%   Past Medical History:  Diagnosis Date   Anxiety    Arthritis    Left ankle, hand   COPD (chronic obstructive pulmonary disease) ([facility])    COPD (chronic obstructive pulmonary disease) ([place])    Diabetes mellitus without complication ([place])    Drug abuse ([place])    DVT (deep venous thrombosis) ([facility])    Bilateral legs   Dysrhythmia    SVT   Gait disturbance    GERD (gastroesophageal reflux disease)    Headache(784.0) 2009   Initiated tx for loss of memory-Brain tumor; 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   History of ankle surgery    History of brain tumor    Hyperlipidemia    Hypertension    Memory deficit    from brain surgery- benign tumor   Memory loss    On home oxygen therapy 08/26/2018   at night while lying down   Peripheral vision loss    Left due to brain surgery   Pneumonia    Polycythemia    Seizures ([place]) 11/19/2013   none since 2016   Shortness of breath    Hx of smoking.   SVT  (supraventricular tachycardia) ([facility])    Wears dentures    Wears glasses      Review of Systems neg for any significant sore throat, dysphagia, itching, sneezing, nasal congestion or excess/ purulent secretions, fever, chills, sweats, unintended wt loss, pleuritic or exertional cp, hempoptysis, orthopnea pnd or change in chronic leg swelling. Also denies presyncope, palpitations, heartburn, abdominal pain, nausea, vomiting, diarrhea or change in bowel or urinary habits, dysuria,hematuria, rash, arthralgias, visual complaints, headache, numbness weakness or ataxia.     Objective:   Physical Exam  Gen. Pleasant, well-nourished, in no distress, anxious affect ENT - no thrush, no pallor/icterus,no post nasal drip Neck: No JVD, no thyromegaly, no carotid bruits Lungs: no use of accessory muscles, no dullness to percussion, decreased bilateral without rales or rhonchi  Cardiovascular: Rhythm regular, heart sounds  normal, no murmurs or gallops, no peripheral edema Musculoskeletal: No deformities, no cyanosis or clubbing        Assessment & Plan:

## 2021-06-21 NOTE — Assessment & Plan Note (Signed)
Doxycycline 100 mg daily for 7 days. Take Mucinex 600 mg twice daily for sputum production.  Flu shot today. Continue Trelegy. Low-dose CT chest screening, discussed risks and benefits, shared decision making implemented

## 2021-06-21 NOTE — Addendum Note (Signed)
Addended by: Fritzi Mandes D on: 06/21/2021 11:37 AM   Modules accepted: Orders

## 2021-06-21 NOTE — Assessment & Plan Note (Signed)
He has gradually cut down but I emphasized to him that smoking cessation is the most important intervention that would add years to his life

## 2021-06-21 NOTE — Assessment & Plan Note (Signed)
Ambulatory saturation today.  Saturation was 88% at rest.  He will qualify for portable oxygen and we will send a prescription to DME

## 2021-06-23 ENCOUNTER — Other Ambulatory Visit: Payer: Self-pay | Admitting: Neurology

## 2021-07-19 ENCOUNTER — Telehealth: Payer: Self-pay | Admitting: Pulmonary Disease

## 2021-07-19 DIAGNOSIS — J9611 Chronic respiratory failure with hypoxia: Secondary | ICD-10-CM

## 2021-07-19 DIAGNOSIS — J441 Chronic obstructive pulmonary disease with (acute) exacerbation: Secondary | ICD-10-CM

## 2021-07-20 NOTE — Telephone Encounter (Signed)
Megan from [employer] on message for order of oxygen. Megan phone number is [phone].

## 2021-07-20 NOTE — Telephone Encounter (Signed)
[facility] [employer], will have to call back Monday

## 2021-07-24 NOTE — Telephone Encounter (Signed)
Called and spoke with Legrand Como from [facility] and he states that they need let last OV notes and demographics sheet faxed over to them. Those have been faxed over to [phone]. He also states that he needs an order for home and portable oxygen sent to them. DME Order has been placed. He said that he was going to call the patient to let him know that he has talked to [place] and that things are in the works. Nothing further needed at this time.

## 2021-08-03 NOTE — Progress Notes (Signed)
Patient not seen for virtual visit.  He did not respond to 2 phone calls and text message with link for visit. -

## 2021-08-05 ENCOUNTER — Other Ambulatory Visit: Payer: Self-pay | Admitting: Neurology

## 2021-08-06 ENCOUNTER — Telehealth (INDEPENDENT_AMBULATORY_CARE_PROVIDER_SITE_OTHER): Payer: 59 | Admitting: Neurology

## 2021-08-06 ENCOUNTER — Other Ambulatory Visit: Payer: Self-pay

## 2021-08-06 ENCOUNTER — Encounter: Payer: Self-pay | Admitting: Neurology

## 2021-08-06 DIAGNOSIS — G0490 Encephalitis and encephalomyelitis, unspecified: Secondary | ICD-10-CM

## 2021-08-09 DIAGNOSIS — N400 Enlarged prostate without lower urinary tract symptoms: Secondary | ICD-10-CM | POA: Insufficient documentation

## 2021-08-10 ENCOUNTER — Telehealth: Payer: Self-pay | Admitting: Neurology

## 2021-08-10 ENCOUNTER — Other Ambulatory Visit: Payer: Self-pay | Admitting: *Deleted

## 2021-08-10 DIAGNOSIS — Z87891 Personal history of nicotine dependence: Secondary | ICD-10-CM

## 2021-08-10 DIAGNOSIS — F17210 Nicotine dependence, cigarettes, uncomplicated: Secondary | ICD-10-CM

## 2021-08-10 NOTE — Telephone Encounter (Signed)
Patient dismissed from [facility] by Metta Clines, DO, effective 08/06/21. Dismissal Letter sent out by 1st class mail. KLM

## 2021-09-11 ENCOUNTER — Other Ambulatory Visit: Payer: Self-pay | Admitting: Neurology

## 2021-09-13 ENCOUNTER — Other Ambulatory Visit: Payer: Self-pay

## 2021-09-13 ENCOUNTER — Telehealth: Payer: 59 | Admitting: Acute Care

## 2021-09-18 ENCOUNTER — Ambulatory Visit (HOSPITAL_COMMUNITY): Admission: RE | Admit: 2021-09-18 | Payer: 59 | Source: Ambulatory Visit

## 2021-09-21 DIAGNOSIS — R5383 Other fatigue: Secondary | ICD-10-CM | POA: Insufficient documentation

## 2021-10-11 ENCOUNTER — Other Ambulatory Visit: Payer: Self-pay | Admitting: Neurology

## 2021-10-19 DIAGNOSIS — R002 Palpitations: Secondary | ICD-10-CM | POA: Insufficient documentation

## 2021-10-19 DIAGNOSIS — M5417 Radiculopathy, lumbosacral region: Secondary | ICD-10-CM | POA: Insufficient documentation

## 2021-10-23 ENCOUNTER — Encounter: Payer: Self-pay | Admitting: Primary Care

## 2021-10-23 ENCOUNTER — Other Ambulatory Visit: Payer: Self-pay

## 2021-10-23 ENCOUNTER — Telehealth (INDEPENDENT_AMBULATORY_CARE_PROVIDER_SITE_OTHER): Payer: Medicare Other | Admitting: Primary Care

## 2021-10-23 DIAGNOSIS — F17210 Nicotine dependence, cigarettes, uncomplicated: Secondary | ICD-10-CM

## 2021-10-23 DIAGNOSIS — F17200 Nicotine dependence, unspecified, uncomplicated: Secondary | ICD-10-CM

## 2021-10-23 NOTE — Progress Notes (Signed)
Virtual Visit via Telephone Note  I connected with [name] on 10/23/21 at  2:00 PM EST by telephone and verified that I am speaking with the correct person using two identifiers.  Location: Patient: Home Provider: Office   I discussed the limitations, risks, security and privacy concerns of performing an evaluation and management service by telephone and the availability of in person appointments. I also discussed with the patient that there may be a patient responsible charge related to this service. The patient expressed understanding and agreed to proceed.    Shared Decision Making Visit Lung Cancer Screening Program [phone])   Eligibility: Age 56 y.o. Pack Years Smoking History Calculation 60 (# packs/per year x # years smoked) Recent History of coughing up blood  no Unexplained weight loss? no ( >Than 15 pounds within the last 6 months ) Prior History Lung / other cancer no (Diagnosis within the last 5 years already requiring surveillance chest CT Scans). Smoking Status Current Smoker Former Smokers: Years since quit: NA  Quit Date: NA  Visit Components: Discussion included one or more decision making aids. yes Discussion included risk/benefits of screening. yes Discussion included potential follow up diagnostic testing for abnormal scans. yes Discussion included meaning and risk of over diagnosis. yes Discussion included meaning and risk of False Positives. yes Discussion included meaning of total radiation exposure. yes  Counseling Included: Importance of adherence to annual lung cancer LDCT screening. yes Impact of comorbidities on ability to participate in the program. yes Ability and willingness to under diagnostic treatment. yes  Smoking Cessation Counseling: Current Smokers:  Discussed importance of smoking cessation. yes Information about tobacco cessation classes and interventions provided to patient. yes Patient provided with "ticket" for LDCT  Scan. NA Symptomatic Patient. no  Counseling(Intermediate counseling: > three minutes) 99406 Diagnosis Code: Tobacco Use Z72.0 Asymptomatic Patient yes  Counseling (Intermediate counseling: > three minutes counseling) V5643 Former Smokers:  Discussed the importance of maintaining cigarette abstinence. yes Diagnosis Code: Personal History of Nicotine Dependence. P29.518 Information about tobacco cessation classes and interventions provided to patient. Yes Patient provided with "ticket" for LDCT Scan. NA Written Order for Lung Cancer Screening with LDCT placed in Epic. Yes (CT Chest Lung Cancer Screening Low Dose W/O CM) [id number] Z12.2-Screening of respiratory organs Z87.891-Personal history of nicotine dependence  I have spent 25 minutes of face to face/ virtual visit   time with Mr Bebe Shaggy discussing the risks and benefits of lung cancer screening. We viewed / discussed a power point together that explained in detail the above noted topics. We paused at intervals to allow for questions to be asked and answered to ensure understanding.We discussed that the single most powerful action that he can take to decrease  risk of developing lung cancer is to quit smoking. We discussed whether or not he is ready to commit to setting a quit date. We discussed options for tools to aid in quitting smoking including nicotine replacement therapy, non-nicotine medications, support groups, Quit Smart classes, and behavior modification. We discussed that often times setting smaller, more achievable goals, such as eliminating 1 cigarette a day for a week and then 2 cigarettes a day for a week can be helpful in slowly decreasing the number of cigarettes smoked. This allows for a sense of accomplishment as well as providing a clinical benefit. I provided him with smoking cessation  information  with contact information for community resources, classes, free nicotine replacement therapy, and access to mobile apps, text  messaging, and on-line  smoking cessation help. I have also provided him the office contact information in the event he needs to contact me, or the screening staff. We discussed the time and location of the scan, and that either Doroteo Glassman RN, Joella Prince, RN  or I will call / send a letter with the results within 24-72 hours of receiving them. The patient verbalized understanding of all of  the above and had no further questions upon leaving the office. They have my contact information in the event they have any further questions.  I spent 3-5 minutes counseling on smoking cessation and the health risks of continued tobacco abuse.  I explained to the patient that there has been a high incidence of coronary artery disease noted on these exams. I explained that this is a non-gated exam therefore degree or severity cannot be determined. This patient is on statin therapy. I have asked the patient to follow-up with their PCP regarding any incidental finding of coronary artery disease and management with diet or medication as their PCP  feels is clinically indicated. The patient verbalized understanding of the above and had no further questions upon completion of the visit.  He has quit once before, former 2ppd smoker now down to half pack. Wellbutrin made him smoke more.    Martyn Ehrich, NP

## 2021-10-23 NOTE — Patient Instructions (Signed)
Thank you for participating in the [place] Lung Cancer Screening Program. °It was our pleasure to meet you today. °We will call you with the results of your scan within the next few days. °Your scan will be assigned a Lung RADS category score by the physicians reading the scans.  °This Lung RADS score determines follow up scanning.  °See below for description of categories, and follow up screening recommendations. °We will be in touch to schedule your follow up screening annually or based on recommendations of our providers. °We will fax a copy of your scan results to your Primary Care Physician, or the physician who referred you to the program, to ensure they have the results. °Please call the office if you have any questions or concerns regarding your scanning experience or results.  °Our office number is [phone]. °Please speak with Denise Phelps, RN. She is our Lung Cancer Screening RN. °If she is unavailable when you call, please have the office staff send her a message. She will return your call at her earliest convenience. °Remember, if your scan is normal, we will scan you annually as long as you continue to meet the criteria for the program. (Age 55-77, Current smoker or smoker who has quit within the last 15 years). °If you are a smoker, remember, quitting is the single most powerful action that you can take to decrease your risk of lung cancer and other pulmonary, breathing related problems. °We know quitting is hard, and we are here to help.  °Please let us know if there is anything we can do to help you meet your goal of quitting. °If you are a former smoker, congratulations. We are proud of you! Remain smoke free! °Remember you can refer friends or family members through the number above.  °We will screen them to make sure they meet criteria for the program. °Thank you for helping us take better care of you by participating in Lung Screening. ° °You can receive free nicotine replacement therapy  ( patches, gum or mints) by calling 1-800-QUIT NOW. Please call so we can get you on the path to becoming  a non-smoker. I know it is hard, but you can do this! ° °Lung RADS Categories: ° °Lung RADS 1: no nodules or definitely non-concerning nodules.  °Recommendation is for a repeat annual scan in 12 months. ° °Lung RADS 2:  nodules that are non-concerning in appearance and behavior with a very low likelihood of becoming an active cancer. °Recommendation is for a repeat annual scan in 12 months. ° °Lung RADS 3: nodules that are probably non-concerning , includes nodules with a low likelihood of becoming an active cancer.  Recommendation is for a 6-month repeat screening scan. Often noted after an upper respiratory illness. We will be in touch to make sure you have no questions, and to schedule your 6-month scan. ° °Lung RADS 4 A: nodules with concerning findings, recommendation is most often for a follow up scan in 3 months or additional testing based on our provider's assessment of the scan. We will be in touch to make sure you have no questions and to schedule the recommended 3 month follow up scan. ° °Lung RADS 4 B:  indicates findings that are concerning. We will be in touch with you to schedule additional diagnostic testing based on our provider's  assessment of the scan. ° °Hypnosis for smoking cessation  °[employer] °[phone] ° °Acupuncture for smoking cessation  °[facility] °[phone]  °

## 2021-10-24 ENCOUNTER — Ambulatory Visit (HOSPITAL_COMMUNITY): Admission: RE | Admit: 2021-10-24 | Payer: Medicare Other | Source: Ambulatory Visit

## 2021-11-15 ENCOUNTER — Ambulatory Visit (HOSPITAL_COMMUNITY)
Admission: RE | Admit: 2021-11-15 | Discharge: 2021-11-15 | Disposition: A | Discharge status: Home / Self Care | Payer: Medicare Other | Source: Ambulatory Visit | Attending: Nurse Practitioner | Admitting: Nurse Practitioner

## 2021-11-15 ENCOUNTER — Telehealth: Payer: Self-pay | Admitting: Pulmonary Disease

## 2021-11-15 DIAGNOSIS — J9611 Chronic respiratory failure with hypoxia: Secondary | ICD-10-CM

## 2021-11-15 DIAGNOSIS — R000 Tachycardia, unspecified: Secondary | ICD-10-CM | POA: Insufficient documentation

## 2021-11-15 NOTE — Telephone Encounter (Signed)
LVM for patients wife Vickie/ patient (ok per dpr) for one of them to call me back regarding question. Noted a referral to inogen in chart.

## 2021-11-15 NOTE — Telephone Encounter (Signed)
Patients wife returned call and states that the patient does not want or currently use inogen. Asked if order could be sent to [facility] for oxygen needs. Will place new order for [facility] to take over oxygen needs and route to PCCs so they are aware of change in order.

## 2021-11-16 NOTE — Telephone Encounter (Signed)
After speaking with [facility]. She states patient will need another qualifying walk for home and portable O2 order to be sent to [facility] since it is not within 30 day time frame. Called and notified Vickie (dpr) and she states pt can come to [facility] office for 6 min walk on Monday. Scheduled pt for 11/19/21 3:30 for 6 min walk. Will place order after patient qualifies for O2

## 2021-11-19 ENCOUNTER — Ambulatory Visit: Payer: Medicare Other

## 2021-11-21 ENCOUNTER — Other Ambulatory Visit: Payer: Self-pay | Admitting: Neurology

## 2021-11-21 ENCOUNTER — Other Ambulatory Visit (HOSPITAL_COMMUNITY): Payer: Self-pay | Admitting: Neurology

## 2021-11-21 DIAGNOSIS — G060 Intracranial abscess and granuloma: Secondary | ICD-10-CM

## 2021-11-23 ENCOUNTER — Ambulatory Visit: Payer: Medicare Other | Admitting: Primary Care

## 2021-11-27 ENCOUNTER — Other Ambulatory Visit: Payer: Self-pay

## 2021-11-27 ENCOUNTER — Encounter: Payer: Self-pay | Admitting: Primary Care

## 2021-11-27 ENCOUNTER — Ambulatory Visit (INDEPENDENT_AMBULATORY_CARE_PROVIDER_SITE_OTHER): Payer: Medicare Other | Admitting: Primary Care

## 2021-11-27 VITALS — BP 130/72 | HR 103 | Temp 97.9°F | Ht 72.0 in | Wt 239.0 lb

## 2021-11-27 DIAGNOSIS — J9611 Chronic respiratory failure with hypoxia: Secondary | ICD-10-CM

## 2021-11-27 DIAGNOSIS — Z720 Tobacco use: Secondary | ICD-10-CM | POA: Diagnosis not present

## 2021-11-27 DIAGNOSIS — J449 Chronic obstructive pulmonary disease, unspecified: Secondary | ICD-10-CM | POA: Diagnosis not present

## 2021-11-27 NOTE — Progress Notes (Signed)
$'@Patient'T$  ID: Harold Bailey, male    DOB: 16-Aug-1966, 56 y.o.   MRN: [id number]  Chief Complaint  Patient presents with   Follow-up    Here for a qualifying walk to get on a POC. Pt states he is on 4L on a concentrator at home.     Referring provider: Ginny Forth*  HPI: 56 year old male, current every day smoker. PMH significant for COPD, chronic respiratory failure/O2 dependent, HTN, GERD, seizure disorder, alcohol/drug abuse, hyperlipidemia, polycythemia, prediabetes, tobacco abuse. Patient of Dr. [name], last seen on 06/21/21.   Previous [facility] pulmonary encounter: 06/21/21- Dr. [name]  56 yo smoker for FU of COPD & chronic hypoxic resp failure.  04/2017 Hosp  for 2 months for chronic respiratory failure, MRSA pneumonia, C. difficile colitis and tracheostomy due to prolonged mechanical ventilation.  He was discharged on oxygen about 4 L at daytime and 2 L during sleep.     PMH -substance abuse,went through withdrawal of pain medications in 09/2017. On Xanax 1 mg thrice daily.  BIpolar &  seizure disorder on 2 anticonvulsants   He used to smoke about 2 to 4 packs/day, has been able to cut down 1 pack every other day. He does a part-time job working at [employer]  Feels like he needs portable oxygen. Oxygen saturation was 88% on arrival today.  He reports cough productive of yellow sputum, no wheezing, no pedal edema.  Trelegy helps  11/27/2021- interim hx  Patient presents today for oxygen re-qualification. His breathing is baseline. He is compliant with Trelegy. He gets short winded with minimal activity.  He does all ADLs but needs frequent breaks. He uses 4L with exertion and at bedtime. He has a home concentrator and tanks but is wanting to get POC. O2 88% RA, able to maintain O2 91% on 3L pulsed. He uses [facility] for oxygen. He quit drinking alcohol last month. No withdrawals. He is still smoking but has cut back from 4ppd to 1/2 pack daily. Denies cough, chest  tightness or wheezing.    Significant tests/ events reviewed Spirometry 09/2018 severe airway obstruction with ratio of 67, FEV1 of 39% FVC of 45%   Allergies  Allergen Reactions   Hydrocodone Rash    Can take percocet   Lyrica [Pregabalin] Swelling    Swelling and itching in hands   Other     No narcotics due to history of opioid dependence    Immunization History  Administered Date(s) Administered   Influenza Inj Mdck Quad Pf 06/23/2019   Influenza,inj,Quad PF,6+ Mos 06/21/2021   Influenza-Unspecified 06/17/2012, 04/23/2016   Janssen (J&J) SARS-COV-2 Vaccination 12/02/2019   Moderna SARS-COV2 Booster Vaccination 09/28/2020   Tdap 05/19/2012    Past Medical History:  Diagnosis Date   Anxiety    Arthritis    Left ankle, hand   COPD (chronic obstructive pulmonary disease) (HCC)    COPD (chronic obstructive pulmonary disease) (HCC)    Diabetes mellitus without complication ([place])    Drug abuse ([place])    DVT (deep venous thrombosis) (HCC)    Bilateral legs   Dysrhythmia    SVT   Gait disturbance    GERD (gastroesophageal reflux disease)    Headache(784.0) 2009   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   History of ankle surgery    History of brain tumor    Hyperlipidemia    Hypertension    Memory deficit    from brain surgery-  benign tumor   Memory loss    On home oxygen therapy 08/26/2018   at night while lying down   Peripheral vision loss    Left due to brain surgery   Pneumonia    Polycythemia    Seizures ([place]) 11/19/2013   none since 2016   Shortness of breath    Hx of smoking.   SVT (supraventricular tachycardia) (HCC)    Wears dentures    Wears glasses     Tobacco History: Social History   Tobacco Use  Smoking Status Every Day   Packs/day: 1.00   Years: 40.00   Pack years: 40.00   Types: Cigarettes  Smokeless Tobacco Never  Tobacco Comments   20 cigarettes smoked daily 12/14/20 [facility]    Ready to quit: Not  Answered Counseling given: Not Answered Tobacco comments: 20 cigarettes smoked daily 12/14/20 [facility]    Outpatient Medications Prior to Visit  Medication Sig Dispense Refill   albuterol (PROVENTIL HFA;VENTOLIN HFA) 108 (90 Base) MCG/ACT inhaler Inhale 2 puffs into the lungs every 6 (six) hours as needed for wheezing or shortness of breath. 1 Inhaler 6   allopurinol (ZYLOPRIM) 300 MG tablet Take 300 mg by mouth daily.     amitriptyline (ELAVIL) 75 MG tablet TAKE 1 TABLET(75 MG) BY MOUTH AT BEDTIME 30 tablet 5   Ascorbic Acid (VITAMIN C PO) Take 1 tablet by mouth daily.     aspirin EC 81 MG tablet Take 81 mg by mouth daily.     docusate sodium (COLACE) 100 MG capsule Take 1 capsule (100 mg total) by mouth 2 (two) times daily. 10 capsule 0   gabapentin (NEURONTIN) 300 MG capsule TABLET 1 CAPSULE BY MOUTH THREE TIMES DAILY 90 capsule 0   levETIRAcetam (KEPPRA) 500 MG tablet TAKE 1 TABLET(500 MG) BY MOUTH TWICE DAILY 60 tablet 3   meloxicam (MOBIC) 7.5 MG tablet TAKE 1 TABLET(7.5 MG) BY MOUTH DAILY (Patient taking differently: Take 7.5 mg by mouth daily.) 90 tablet 0   metFORMIN (GLUCOPHAGE) 500 MG tablet Take 500 mg by mouth 2 (two) times daily.     metoprolol succinate (TOPROL-XL) 50 MG 24 hr tablet Take 50 mg by mouth daily.     Multiple Vitamins-Minerals (MULTIVITAMIN WITH MINERALS) tablet Take 2 tablets by mouth daily.      Naphazoline-Pheniramine (OPCON-A) 0.027-0.315 % SOLN Place 1 drop into both eyes every 8 (eight) hours as needed (red, irritated eyes).     neomycin-polymyxin-hydrocortisone (CORTISPORIN) 3.5-10000-1 OTIC suspension Place 2 drops into the left ear 2 (two) times daily as needed (ear pain).      Oxcarbazepine (TRILEPTAL) 300 MG tablet TAKE 1 AND 1/2 TABLETS(450 MG) BY MOUTH TWICE DAILY 90 tablet 5   oxyCODONE-acetaminophen (PERCOCET/ROXICET) 5-325 MG tablet Take 1 tablet by mouth every 4 (four) hours as needed. 30 tablet 0   OXYGEN Inhale into the lungs. At home oxygen      polyethylene glycol (MIRALAX / GLYCOLAX) 17 g packet Take 17 g by mouth daily. 14 each 0   rosuvastatin (CRESTOR) 10 MG tablet Take 10 mg by mouth daily at 6 PM.     TRELEGY ELLIPTA 100-62.5-25 MCG/INH AEPB INHALE 1 PUFF INTO THE LUNGS DAILY (Patient taking differently: Take 1 puff by mouth daily.) 60 each 5   VITAMIN D PO Take 1 capsule by mouth daily.     VOLTAREN 1 % GEL APPLY 2 TO 4 GRAMS EXTERNALLY TO THE AFFECTED AREA TWICE DAILY (Patient taking differently: Apply 4 g topically 4 (  four) times daily as needed (pain).) 500 g 0   No facility-administered medications prior to visit.    Review of Systems  Review of Systems  Constitutional: Negative.   HENT: Negative.    Respiratory:  Positive for shortness of breath. Negative for cough, chest tightness and wheezing.     Physical Exam  BP 130/72 (BP Location: Left Arm, Patient Position: Sitting, Cuff Size: Normal)    Pulse (!) 103    Temp 97.9 F (36.6 C) (Oral)    Ht 6' (1.829 m)    Wt 239 lb (108.4 kg)    SpO2 (!) 89%    BMI 32.41 kg/m  Physical Exam Constitutional:      Appearance: Normal appearance.  HENT:     Head: Normocephalic and atraumatic.     Mouth/Throat:     Mouth: Mucous membranes are moist.     Pharynx: Oropharynx is clear.  Cardiovascular:     Rate and Rhythm: Normal rate and regular rhythm.  Pulmonary:     Effort: Pulmonary effort is normal.     Breath sounds: Normal breath sounds. No wheezing, rhonchi or rales.  Musculoskeletal:        General: Normal range of motion.  Skin:    General: Skin is warm and dry.  Neurological:     General: No focal deficit present.     Mental Status: He is alert and oriented to person, place, and time. Mental status is at baseline.  Psychiatric:        Mood and Affect: Mood normal.        Behavior: Behavior normal.        Thought Content: Thought content normal.        Judgment: Judgment normal.     Lab Results:  CBC    Component Value Date/Time   WBC 8.3 01/29/2021  1522   RBC 5.31 01/29/2021 1522   HGB 17.8 (H) 01/29/2021 1522   HCT 52.7 (H) 01/29/2021 1522   PLT 200.0 01/29/2021 1522   MCV 99.2 01/29/2021 1522   MCH 34.3 (H) 02/01/2020 1124   MCHC 33.9 01/29/2021 1522   RDW 14.1 01/29/2021 1522   LYMPHSABS 2.6 07/31/2020 1532   MONOABS 0.7 07/31/2020 1532   EOSABS 0.2 07/31/2020 1532   BASOSABS 0.0 07/31/2020 1532    BMET    Component Value Date/Time   NA 134 (L) 01/29/2021 1522   K 5.0 01/29/2021 1522   CL 97 01/29/2021 1522   CO2 32 01/29/2021 1522   GLUCOSE 97 01/29/2021 1522   BUN 12 01/29/2021 1522   CREATININE 0.84 01/29/2021 1522   CREATININE 0.89 07/29/2019 1403   CALCIUM 9.7 01/29/2021 1522   GFRNONAA >60 02/01/2020 1124   GFRNONAA 97 07/29/2019 1403   GFRAA >60 02/01/2020 1124   GFRAA 112 07/29/2019 1403    BNP No results found for: BNP  ProBNP No results found for: PROBNP  Imaging: No results found.   Assessment & Plan:   COPD (chronic obstructive pulmonary disease) ([place]) - Stable; No acute respiratory complaints today. He has baseline dyspnea with exertion. - Continue Trelegy 142mg as directed daily - Follow-up in 3-4 months with Dr. AElsworth Sohoor BEustaquio MaizeNP   Chronic respiratory failure with hypoxia ([place] - Stable; Patient looking to get POC. He was able to maintain O2 saturation >90% on 3L pulsed, if [facility] is unable to provide consider Inogen  - Continue to wear 3-4L oxygen with any exertion and at bedtime   Tobacco abuse -  Current 1/2 ppd smoker (previously 4 ppd) - Continue to strongly encourage complete smoking cessation, we have set a goal that he will quit by his next visit  - Following with lung cancer screening program, low dose CT chest scheduled March 15th at 2pm at [facility], NP 11/28/2021

## 2021-11-27 NOTE — Patient Instructions (Addendum)
Congratulations on quitting drinking and cutting back on tobacco use. I am so proud of you! Next goal is to stop smokng by next visit with [place]. We are here for support if you ever need ? ?Recommendation: ?- Continue Trelegy as directed ?- Continue to work on smoking cessation ?- Continue to wear 3-4L oxygen with any exertion and at bedtime  ?- We will place an order for you to be provided with portable concentrator (If they can not provide for you, we can try Inogen) ?- You scheduled for CT chest March 15th at 2pm at [facility] (Recommend you get there at 1:40) ? ?Follow-up: ?- 3-4 months with Dr. [name] or Eustaquio Maize NP  ? ?

## 2021-11-28 NOTE — Assessment & Plan Note (Signed)
-   Current 1/2 ppd smoker (previously 4 ppd) ?- Continue to strongly encourage complete smoking cessation, we have set a goal that he will quit by his next visit  ?- Following with lung cancer screening program, low dose CT chest scheduled March 15th at 2pm at [facility]  ?

## 2021-11-28 NOTE — Assessment & Plan Note (Addendum)
-   Stable; No acute respiratory complaints today. He has baseline dyspnea with exertion. ?- Continue Trelegy 168mg as directed daily ?- Follow-up in 3-4 months with Dr. AElsworth Sohoor BEustaquio MaizeNP  ?

## 2021-11-28 NOTE — Assessment & Plan Note (Signed)
-   Stable; Patient looking to get POC. He was able to maintain O2 saturation >90% on 3L pulsed, if [facility] is unable to provide consider Inogen  ?- Continue to wear 3-4L oxygen with any exertion and at bedtime  ?

## 2021-12-05 ENCOUNTER — Other Ambulatory Visit: Payer: Self-pay

## 2021-12-05 ENCOUNTER — Ambulatory Visit (HOSPITAL_COMMUNITY)
Admission: RE | Admit: 2021-12-05 | Discharge: 2021-12-05 | Disposition: A | Discharge status: Home / Self Care | Payer: Medicare Other | Source: Ambulatory Visit | Attending: Acute Care | Admitting: Acute Care

## 2021-12-05 ENCOUNTER — Other Ambulatory Visit: Payer: Self-pay | Admitting: Neurology

## 2021-12-05 DIAGNOSIS — F17210 Nicotine dependence, cigarettes, uncomplicated: Secondary | ICD-10-CM | POA: Diagnosis not present

## 2021-12-05 DIAGNOSIS — Z87891 Personal history of nicotine dependence: Secondary | ICD-10-CM | POA: Insufficient documentation

## 2021-12-07 ENCOUNTER — Ambulatory Visit (HOSPITAL_COMMUNITY)
Admission: RE | Admit: 2021-12-07 | Discharge: 2021-12-07 | Disposition: A | Discharge status: Home / Self Care | Payer: Medicare Other | Source: Ambulatory Visit | Attending: Neurology | Admitting: Neurology

## 2021-12-07 ENCOUNTER — Telehealth: Payer: Self-pay | Admitting: Acute Care

## 2021-12-07 ENCOUNTER — Other Ambulatory Visit: Payer: Self-pay

## 2021-12-07 DIAGNOSIS — G060 Intracranial abscess and granuloma: Secondary | ICD-10-CM | POA: Diagnosis present

## 2021-12-07 MED ORDER — GADOBUTROL 1 MMOL/ML IV SOLN
10.0000 mL | Freq: Once | INTRAVENOUS | Status: AC | PRN
  Administered 2021-12-07: 10 mL via INTRAVENOUS

## 2021-12-07 NOTE — Telephone Encounter (Signed)
I have called the patient with the results of his low dose Ct Chest. From a Lung cancer perspective the results are Lung RADS 1, negative study: no nodules or definitely benign nodules. Radiology recommendation is for a repeat LDCT in 12 months.  ?I explained that there was an incidental finding of a Fluid collection involving the anterior superior spleen, new from ?01/15/2015, possibly posttraumatic in etiology. Further evaluation ?is limited without IV contrast. If further evaluation is desired, ?initial evaluation with ultrasound may prove useful. ? ?Pt. States he has had several falls. He does not have any pain. He is seeing his PCP 3/20, and I have asked him to discuss this with them so they can order any follow up diagnostics that they feel are clinically indicated after examining him.  ? ?Pt. Did have an MRI brain today that has not yet been read.  ? ?Denise, please place order for 12 month low dose Ct Chest. ?Please fax results to the PCP . ?Thanks so mcuh ?

## 2021-12-10 ENCOUNTER — Other Ambulatory Visit: Payer: Self-pay | Admitting: Acute Care

## 2021-12-10 DIAGNOSIS — F17210 Nicotine dependence, cigarettes, uncomplicated: Secondary | ICD-10-CM

## 2021-12-10 NOTE — Telephone Encounter (Signed)
CT results faxed to PCP. Order placed for 12 mnt f/u low dose CT.  ?

## 2021-12-31 NOTE — Progress Notes (Signed)
CARDIOLOGY CONSULT NOTE  ? ? ? ? ? ?Patient ID: ?Harold Bailey ?MRN: [id number] ?DOB/AGE: August 14, 1966 56 y.o. ? ?Admit date: (Not on file) ?Referring Physician: Volanda Napoleon NP  ?Primary Physician: Yves Dill, NP ?Primary Cardiologist: New ?Reason for Consultation: Palpitations ? ?Active Problems: ?  * No active hospital problems. * ? ? ?HPI:  56 y.o. smoker with oxygen dependant COPD , HTN, GERD, seizures alcohol/drug use HLD P vera seen for palpitations September 2022 had prolonged hospitalization for MRSA requiring tracheostomy Says he quit drinking in February Use to smoke 4 ppd and down to < 1 ppd CT 12/06/21 with emphysema no cancer  ? 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 ?Has not seen cardiology before. His palpitations seem benign ? ?More concerning are episodes of chest tightness / squeezing the last 6 months Most are non exertional ?And at night but some during day with activity Last no longer than 5 minutes ? ?He quit drinking again 11/14/21 He smokes < 1 ppd ?Activity limited by bilateral ankle fusions  ? ?ROS ?All other systems reviewed and negative except as noted above ? ?Past Medical History:  ?Diagnosis Date  ? Anxiety   ? Arthritis   ? Left ankle, hand  ? COPD (chronic obstructive pulmonary disease) ([place])   ? COPD (chronic obstructive pulmonary disease) ([place])   ? Diabetes mellitus without complication ([place])   ? Drug abuse ([place])   ? DVT (deep venous thrombosis) ([place])   ? Bilateral legs  ? Dysrhythmia   ? SVT  ? Gait disturbance   ? GERD (gastroesophageal reflux disease)   ? KGMWNUUV(253.6) 2009  ? 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  ? History of ankle surgery   ? History of brain tumor   ? Hyperlipidemia   ? Hypertension   ? Memory  deficit   ? from brain surgery- benign tumor  ? Memory loss   ? On home oxygen therapy 08/26/2018  ? at night while lying down  ? Peripheral vision loss   ? Left due to brain surgery  ? Pneumonia   ? Polycythemia   ? Seizures ([place]) 11/19/2013  ? none since 2016  ? Shortness of breath   ? Hx of smoking.  ? SVT (supraventricular tachycardia) ([place])   ? Wears dentures   ? Wears glasses   ?  ?Family History  ?Problem Relation Age of Onset  ? Hypertension Mother   ? Bronchitis Mother   ? Cancer Maternal Aunt   ? Cancer Maternal Grandmother   ? Healthy Sister   ? Healthy Brother   ? Stroke Neg Hx   ? Diabetes Neg Hx   ?  ?Social History  ? ?Socioeconomic History  ? Marital status: Married  ?  Spouse name: Vickie  ? Number of children: 1  ? Years of education: 87  ? Highest education level: High school graduate  ?Occupational History  ?  Employer: NOT EMPLOYED  ?  Comment: Disabled  ?Tobacco Use  ? Smoking status: Every Day  ?  Packs/day: 1.00  ?  Years: 40.00  ?  Pack years: 40.00  ?  Types: Cigarettes  ? Smokeless tobacco: Never  ? Tobacco comments:  ?  20 cigarettes smoked daily 12/14/20 [facility]   ?Vaping Use  ? Vaping Use: Never used  ?Substance and Sexual Activity  ? Alcohol use: No  ?  Alcohol/week: 0.0 standard drinks  ?  Comment: quit Oct. 2017(heavy drinker) .In [place]  ? Drug use: No  ? Sexual activity: Not Currently  ?Other Topics Concern  ? Not on file  ?Social History Narrative  ? Patient lives at home with his wife Maxwell Caul)   ? Disabled  ? Left handed  ? Education 12 th   ? Caffeine Coffee four cups a day no tea no soda mostly water  ? ?Social Determinants of Health  ? ?Financial Resource Strain: Not on file  ?Food Insecurity: Not on file  ?Transportation Needs: Not on file  ?Physical Activity: Not on file  ?Stress: Not on file  ?Social Connections: Not on file  ?Intimate Partner Violence: Not on file  ?  ?Past Surgical History:  ?Procedure Laterality Date  ? ABDOMINAL AORTOGRAM W/LOWER EXTREMITY N/A 05/30/2017  ?  Procedure: ABDOMINAL AORTOGRAM W/LOWER EXTREMITY;  Surgeon: Elam Dutch, MD;  Location: [facility];  Service: Cardiovascular;  Laterality: N/A;  ? ABDOMINAL AORTOGRAM W/LOWER EXTREMITY N/A 07/12/2020  ? Procedure: ABDOMINAL AORTOGRAM W/LOWER EXTREMITY;  Surgeon: [name], MD;  Location: [facility];  Service: Cardiovascular;  Laterality: N/A;  ? ANKLE ARTHROSCOPY  01/17/2012  ? Procedure: ANKLE ARTHROSCOPY;  Surgeon: [name], MD;  Location: [facility];  Service: Orthopedics;  Laterality: Left;  ? ANKLE ARTHROSCOPY WITH FUSION Left 10/02/2016  ? Procedure: Left Posterior Subtalar Arthrodesis Arthroscopy;  Surgeon: [name], MD;  Location: [facility];  Service: Orthopedics;  Laterality: Left;  ? ANKLE FUSION Left 11/2012  ? Dr Sharol Given  ? ANKLE FUSION Right 08/09/2020  ? Procedure: RIGHT ANKLE FUSION;  Surgeon: [name], MD;  Location: [facility];  Service: Orthopedics;  Laterality: Right;  ? ANKLE SURGERY  2003  ? Left  ? BRAIN SURGERY  2009  ? Biopsy  ? COLONOSCOPY WITH PROPOFOL N/A 03/11/2019  ? Procedure: COLONOSCOPY WITH PROPOFOL;  Surgeon: Mauri Pole, MD;  Location: [facility];  Service: Endoscopy;  Laterality: N/A;  ? FRACTURE SURGERY  2003  ? Left ankle  ? HARDWARE REMOVAL Left 12/08/2012  ? Procedure: HARDWARE REMOVAL;  Surgeon: [name], MD;  Location: [facility];  Service: Orthopedics;  Laterality: Left;  Removal Deep Hardware, Take Down Non-Union, Revision Internal Fixation Left Ankle  ? I & D EXTREMITY Left 01/01/2013  ? Procedure: IRRIGATION AND DEBRIDEMENT EXTREMITY;  Surgeon: [name], MD;  Location: [facility];  Service: Orthopedics;  Laterality: Left;  Irrigation, Debridement and Placement Antibiotic Beads Left Ankle  ? INNER EAR SURGERY    ? OPEN REDUCTION INTERNAL FIXATION (ORIF) SCAPHOID WITH DISTAL RADIUS GRAFT Right 02/01/2013  ? Procedure: OPEN REDUCTION INTERNAL FIXATION (ORIF) RIGHT SCAPHOID FRACTURE WITH DISTAL RADIUS GRAFT;  Surgeon: Schuyler Amor,  MD;  Location: [facility];  Service: Orthopedics;  Laterality: Right;  ? ORIF ANKLE FRACTURE Left 12/08/2012  ? Procedure: OPEN REDUCTION INTERNAL FIXATION (ORIF) ANKLE FRACTURE;  Surgeon: [name], MD;  Location: [facility];  Service: Orthopedics;  Laterality: Left;  Removal Deep Hardware, Take Down Non-Union, Revision Internal Fixation Left Ankle  ? ORIF ANKLE FRACTURE Right 09/03/2013  ? Procedure: OPEN REDUCTION INTERNAL FIXATION (ORIF) ANKLE FRACTURE;  Surgeon: [name], MD;  Location: [facility];  Service: Orthopedics;  Laterality: Right;  Open Reduction Internal  Fixation Right Fibula  ? POLYPECTOMY  03/11/2019  ? Procedure: POLYPECTOMY;  Surgeon: Mauri Pole, MD;  Location: [facility];  Service: Endoscopy;;  ? SCAPHOID FRACTURE SURGERY  02/04/2013  ? SHOULDER ARTHROSCOPY  2008  ? Left  ? TYMPANOSTOMY TUBE PLACEMENT  1993  ?  ? ? ?Current Outpatient Medications:  ?  albuterol (PROVENTIL HFA;VENTOLIN HFA) 108 (90 Base) MCG/ACT inhaler, Inhale 2 puffs into the lungs every 6 (six) hours as needed for wheezing or shortness of breath., Disp: 1 Inhaler, Rfl: 6 ?  allopurinol (ZYLOPRIM) 300 MG tablet, Take 300 mg by mouth daily., Disp: , Rfl:  ?  Ascorbic Acid (VITAMIN C PO), Take 1 tablet by mouth daily., Disp: , Rfl:  ?  aspirin EC 81 MG tablet, Take 81 mg by mouth daily., Disp: , Rfl:  ?  metFORMIN (GLUCOPHAGE) 500 MG tablet, Take 500 mg by mouth 2 (two) times daily., Disp: , Rfl:  ?  metoprolol succinate (TOPROL-XL) 100 MG 24 hr tablet, Take 100 mg by mouth daily. Take with or immediately following a meal., Disp: , Rfl:  ?  Multiple Vitamins-Minerals (MULTIVITAMIN WITH MINERALS) tablet, Take 2 tablets by mouth daily. , Disp: , Rfl:  ?  Naphazoline-Pheniramine (OPCON-A) 0.027-0.315 % SOLN, Place 1 drop into both eyes every 8 (eight) hours as needed (red, irritated eyes)., Disp: , Rfl:  ?  Oxcarbazepine (TRILEPTAL) 300 MG tablet, TAKE 1 AND 1/2 TABLETS(450 MG) BY MOUTH TWICE DAILY, Disp: 90 tablet, Rfl: 5 ?   oxyCODONE-acetaminophen (PERCOCET/ROXICET) 5-325 MG tablet, Take 1 tablet by mouth every 4 (four) hours as needed., Disp: 30 tablet, Rfl: 0 ?  OXYGEN, Inhale into the lungs. At home oxygen, Disp: , Rfl:  ?  pantoprazo

## 2022-01-07 ENCOUNTER — Encounter: Payer: Self-pay | Admitting: Cardiovascular Disease

## 2022-01-07 ENCOUNTER — Encounter: Payer: Self-pay | Admitting: *Deleted

## 2022-01-07 ENCOUNTER — Telehealth: Payer: Self-pay | Admitting: Cardiovascular Disease

## 2022-01-07 ENCOUNTER — Ambulatory Visit (INDEPENDENT_AMBULATORY_CARE_PROVIDER_SITE_OTHER): Payer: Medicare Other | Admitting: Cardiovascular Disease

## 2022-01-07 ENCOUNTER — Ambulatory Visit: Payer: Medicare Other | Admitting: Cardiovascular Disease

## 2022-01-07 VITALS — BP 118/70 | HR 96 | Ht 72.0 in | Wt 220.0 lb

## 2022-01-07 DIAGNOSIS — R079 Chest pain, unspecified: Secondary | ICD-10-CM

## 2022-01-07 DIAGNOSIS — J432 Centrilobular emphysema: Secondary | ICD-10-CM

## 2022-01-07 DIAGNOSIS — I739 Peripheral vascular disease, unspecified: Secondary | ICD-10-CM | POA: Diagnosis not present

## 2022-01-07 DIAGNOSIS — R002 Palpitations: Secondary | ICD-10-CM | POA: Diagnosis not present

## 2022-01-07 NOTE — Telephone Encounter (Signed)
Checking percert on the following patient for testing scheduled at [facility].   ? ? ?LEXISCAN ? ?01/10/2022 ?

## 2022-01-07 NOTE — Patient Instructions (Signed)
Medication Instructions:  ?Your physician recommends that you continue on your current medications as directed. Please refer to the Current Medication list given to you today. ? ?*If you need a refill on your cardiac medications before your next appointment, please call your pharmacy* ? ? ?Lab Work: ?NONE  ? ?If you have labs (blood work) drawn today and your tests are completely normal, you will receive your results only by: ?MyChart Message (if you have MyChart) OR ?A paper copy in the mail ?If you have any lab test that is abnormal or we need to change your treatment, we will call you to review the results. ? ? ?Testing/Procedures: ?Your physician has requested that you have a lexiscan myoview. For further information please visit [email or web address]. Please follow instruction sheet, as given.  ? ? ?Follow-Up: ?At [facility], you and your health needs are our priority.  As part of our continuing mission to provide you with exceptional heart care, we have created designated Provider Care Teams.  These Care Teams include your primary Cardiologist (physician) and Advanced Practice Providers (APPs -  Physician Assistants and Nurse Practitioners) who all work together to provide you with the care you need, when you need it. ? ?We recommend signing up for the patient portal called "MyChart".  Sign up information is provided on this After Visit Summary.  MyChart is used to connect with patients for Virtual Visits (Telemedicine).  Patients are able to view lab/test results, encounter notes, upcoming appointments, etc.  Non-urgent messages can be sent to your provider as well.   ?To learn more about what you can do with MyChart, go to [email or web address].   ? ?Your next appointment:   ? As Needed  ? ?The format for your next appointment:   ?In Person ? ?Provider:   ?Jenkins Rouge, MD  ? ? ?Other Instructions ?Thank you for choosing [facility]! ?  ? ?Important Information About Sugar ? ? ? ? ?  ?

## 2022-01-10 ENCOUNTER — Encounter (HOSPITAL_COMMUNITY): Payer: Medicare Other

## 2022-01-15 ENCOUNTER — Telehealth: Payer: Self-pay | Admitting: *Deleted

## 2022-01-15 NOTE — Telephone Encounter (Signed)
Returned call to pt's wife Harold Bailey (DPR) and discussed pat's Lung screening CT results from 11/2021. I advised her that we also spoke with pt and reviewed the results. She verbalized understanding. No further questions at this time.  ?

## 2022-01-23 ENCOUNTER — Encounter (HOSPITAL_COMMUNITY): Payer: Medicare Other

## 2022-01-23 ENCOUNTER — Inpatient Hospital Stay (HOSPITAL_COMMUNITY): Admission: RE | Admit: 2022-01-23 | Payer: Medicare Other | Source: Ambulatory Visit

## 2022-02-06 ENCOUNTER — Ambulatory Visit: Payer: 59 | Admitting: Neurology

## 2022-02-07 ENCOUNTER — Encounter (HOSPITAL_COMMUNITY): Payer: Self-pay

## 2022-02-07 ENCOUNTER — Encounter (HOSPITAL_COMMUNITY): Admission: RE | Admit: 2022-02-07 | Payer: Medicare Other | Source: Ambulatory Visit

## 2022-02-07 ENCOUNTER — Ambulatory Visit (HOSPITAL_COMMUNITY): Admission: RE | Admit: 2022-02-07 | Payer: Medicare Other | Source: Ambulatory Visit

## 2022-02-20 ENCOUNTER — Ambulatory Visit (HOSPITAL_COMMUNITY)
Admission: RE | Admit: 2022-02-20 | Discharge: 2022-02-20 | Disposition: A | Discharge status: Home / Self Care | Payer: Medicare Other | Source: Ambulatory Visit | Attending: Cardiovascular Disease | Admitting: Cardiovascular Disease

## 2022-02-20 ENCOUNTER — Encounter (HOSPITAL_COMMUNITY)
Admission: RE | Admit: 2022-02-20 | Discharge: 2022-02-20 | Disposition: A | Discharge status: Home / Self Care | Payer: Medicare Other | Source: Ambulatory Visit | Attending: Cardiovascular Disease | Admitting: Cardiovascular Disease

## 2022-02-20 DIAGNOSIS — R079 Chest pain, unspecified: Secondary | ICD-10-CM | POA: Diagnosis not present

## 2022-02-20 LAB — NM MYOCAR MULTI W/SPECT W/WALL MOTION / EF
LV dias vol: 124 mL (ref 62–150)
LV sys vol: 49 mL
Nuc Stress EF: 60 %
Peak HR: 80 {beats}/min
RATE: 0.3
Rest HR: 71 {beats}/min
Rest Nuclear Isotope Dose: 9.6 mCi
SDS: 3
SRS: 8
SSS: 11
ST Depression (mm): 0 mm
Stress Nuclear Isotope Dose: 29.2 mCi
TID: 1.14

## 2022-02-20 MED ORDER — TECHNETIUM TC 99M TETROFOSMIN IV KIT
10.0000 | PACK | Freq: Once | INTRAVENOUS | Status: AC | PRN
  Administered 2022-02-20: 9.6 via INTRAVENOUS

## 2022-02-20 MED ORDER — TECHNETIUM TC 99M TETROFOSMIN IV KIT
30.0000 | PACK | Freq: Once | INTRAVENOUS | Status: AC | PRN
  Administered 2022-02-20: 29.2 via INTRAVENOUS

## 2022-02-20 MED ORDER — SODIUM CHLORIDE FLUSH 0.9 % IV SOLN
INTRAVENOUS | Status: AC
  Administered 2022-02-20: 10 mL via INTRAVENOUS
  Filled 2022-02-20: qty 10

## 2022-02-20 MED ORDER — REGADENOSON 0.4 MG/5ML IV SOLN
INTRAVENOUS | Status: AC
  Administered 2022-02-20: 0.4 mg via INTRAVENOUS
  Filled 2022-02-20: qty 5

## 2022-02-27 ENCOUNTER — Ambulatory Visit: Payer: Medicare Other | Admitting: Pulmonary Disease

## 2022-03-07 DIAGNOSIS — Z131 Encounter for screening for diabetes mellitus: Secondary | ICD-10-CM | POA: Insufficient documentation

## 2022-03-18 ENCOUNTER — Ambulatory Visit (INDEPENDENT_AMBULATORY_CARE_PROVIDER_SITE_OTHER): Payer: Medicare Other | Admitting: Pulmonary Disease

## 2022-03-18 ENCOUNTER — Encounter: Payer: Self-pay | Admitting: Pulmonary Disease

## 2022-03-18 VITALS — BP 142/84 | HR 94 | Temp 98.4°F | Ht 72.0 in | Wt 218.2 lb

## 2022-03-18 DIAGNOSIS — Z720 Tobacco use: Secondary | ICD-10-CM

## 2022-03-18 DIAGNOSIS — J432 Centrilobular emphysema: Secondary | ICD-10-CM

## 2022-03-18 DIAGNOSIS — R1012 Left upper quadrant pain: Secondary | ICD-10-CM | POA: Diagnosis not present

## 2022-03-18 DIAGNOSIS — J9611 Chronic respiratory failure with hypoxia: Secondary | ICD-10-CM | POA: Diagnosis not present

## 2022-03-22 ENCOUNTER — Ambulatory Visit (HOSPITAL_COMMUNITY)
Admission: RE | Admit: 2022-03-22 | Discharge: 2022-03-22 | Disposition: A | Discharge status: Home / Self Care | Payer: Medicare Other | Source: Ambulatory Visit | Attending: Pulmonary Disease | Admitting: Pulmonary Disease

## 2022-03-22 DIAGNOSIS — R1012 Left upper quadrant pain: Secondary | ICD-10-CM | POA: Insufficient documentation

## 2022-07-19 DIAGNOSIS — Z7409 Other reduced mobility: Secondary | ICD-10-CM | POA: Insufficient documentation

## 2022-08-13 DIAGNOSIS — G4733 Obstructive sleep apnea (adult) (pediatric): Secondary | ICD-10-CM | POA: Insufficient documentation

## 2022-08-26 ENCOUNTER — Encounter (HOSPITAL_BASED_OUTPATIENT_CLINIC_OR_DEPARTMENT_OTHER): Payer: Self-pay

## 2022-08-26 DIAGNOSIS — R569 Unspecified convulsions: Secondary | ICD-10-CM

## 2022-08-26 DIAGNOSIS — R0681 Apnea, not elsewhere classified: Secondary | ICD-10-CM

## 2022-08-26 DIAGNOSIS — G4710 Hypersomnia, unspecified: Secondary | ICD-10-CM

## 2022-08-26 DIAGNOSIS — G4709 Other insomnia: Secondary | ICD-10-CM

## 2022-08-26 DIAGNOSIS — R0683 Snoring: Secondary | ICD-10-CM

## 2022-08-26 DIAGNOSIS — R5383 Other fatigue: Secondary | ICD-10-CM

## 2022-09-03 ENCOUNTER — Ambulatory Visit (INDEPENDENT_AMBULATORY_CARE_PROVIDER_SITE_OTHER): Payer: Medicare Other | Admitting: Pulmonary Disease

## 2022-09-03 ENCOUNTER — Encounter: Payer: Self-pay | Admitting: Pulmonary Disease

## 2022-09-03 VITALS — BP 138/82 | HR 74 | Ht 72.0 in | Wt 218.4 lb

## 2022-09-03 DIAGNOSIS — Z720 Tobacco use: Secondary | ICD-10-CM | POA: Diagnosis not present

## 2022-09-03 DIAGNOSIS — J432 Centrilobular emphysema: Secondary | ICD-10-CM | POA: Diagnosis not present

## 2022-09-03 DIAGNOSIS — J9611 Chronic respiratory failure with hypoxia: Secondary | ICD-10-CM | POA: Diagnosis not present

## 2022-09-03 MED ORDER — BENZONATATE 200 MG PO CAPS: 200.0000 mg | ORAL_CAPSULE | Freq: Three times a day (TID) | ORAL | 1 refills | Status: DC | PRN

## 2022-09-03 NOTE — Assessment & Plan Note (Signed)
Continue Trelegy. Use albuterol for rescue. We discussed COPD action plan and signs and symptoms of COPD exacerbation. He has a residual cough from his recent bronchitis and we will give him Tessalon Perles and OTC Delsym

## 2022-09-03 NOTE — Patient Instructions (Signed)
  Delsym OTC 53m twice daily  X benzonatate 200 mg thrice daily as needed #60

## 2022-09-03 NOTE — Assessment & Plan Note (Signed)
Smoking cessation was again emphasized is the most important intervention that would add years to his life 

## 2022-09-03 NOTE — Assessment & Plan Note (Signed)
Continue O2 during sleep and during exertion

## 2022-09-03 NOTE — Progress Notes (Signed)
   Subjective:    Patient ID: Harold Bailey, male    DOB: 1966-03-23, 56 y.o.   MRN: [id number]  HPI  56 yo smoker for FU of COPD & chronic hypoxic resp failure.  04/2017 Hosp  for 2 months for chronic respiratory failure, MRSA pneumonia, C. difficile colitis and tracheostomy due to prolonged mechanical ventilation.  He was discharged on oxygen about 4 L at daytime and 2 L during sleep.     PMH -substance abuse,went through withdrawal of pain medications in 09/2017. On Xanax 1 mg thrice daily.  BIpolar &  seizure disorder on 2 anticonvulsants   He used to smoke about 2 to 4 packs/day   Chief Complaint  Patient presents with   Follow-up    Pt f/u he is currently sick having cough, congestion, decreased appetite. Denies fevers. Denies covid/flu exposure    He continues to smoke half pack per day He reports cough and congestion for 2 weeks but this is finally resolving, cough is mostly dry now and he requests a medication for this. Is compliant with Trelegy and uses albuterol for rescue Uses oxygen during sleep O2 saturation today is 93% on room air I reviewed his past imaging studies  Significant tests/ events reviewed 05/2021 OV >> sat 88% at rest  11/2021 O2 88% RA, able to maintain O2 91% on 3L pulsed. >> POC Spirometry 09/2018 severe airway obstruction with ratio of 67, FEV1 of 39% FVC of 45%     LDCT 11/2021 >> emphysema ,Fluid collection involving the anterior superior spleen measures 5.8 x 6.3 cm [place] 02/2022 >> no fluid   Review of Systems neg for any significant sore throat, dysphagia, itching, sneezing, nasal congestion or excess/ purulent secretions, fever, chills, sweats, unintended wt loss, pleuritic or exertional cp, hempoptysis, orthopnea pnd or change in chronic leg swelling. Also denies presyncope, palpitations, heartburn, abdominal pain, nausea, vomiting, diarrhea or change in bowel or urinary habits, dysuria,hematuria, rash, arthralgias, visual complaints, headache,  numbness weakness or ataxia.     Objective:   Physical Exam  Gen. Pleasant, in no distress ENT - no lesions, no post nasal drip Neck: No JVD, no thyromegaly, no carotid bruits Lungs: no use of accessory muscles, no dullness to percussion, decreased without rales or rhonchi  Cardiovascular: Rhythm regular, heart sounds  normal, no murmurs or gallops, no peripheral edema Musculoskeletal: No deformities, no cyanosis or clubbing , no tremors       Assessment & Plan:

## 2022-09-06 ENCOUNTER — Encounter: Payer: Medicare Other | Admitting: Hematology

## 2022-09-23 NOTE — Progress Notes (Unsigned)
[facility] [address], [place] [address]   CLINIC:  Medical Oncology/Hematology  CONSULT NOTE Patient Care Team: Nsumanganyi, Ferdinand Lango, NP as PCP - General (Adult Health Nurse Practitioner) Pieter Partridge, DO as Consulting Physician (Neurology)  CHIEF COMPLAINTS/PURPOSE OF CONSULTATION:  Secondary polycythemia  HISTORY OF PRESENTING ILLNESS:  Harold Bailey 57 y.o. 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  Labs sent by PCP from 07/02/2022 show Hgb 19.0 and hematocrit 55.5.  Review of available prior labs shows intermittently elevated hemoglobin/hematocrit since at least 2013, with maximum Hgb 21.8/HCT 64.0 on 07/12/2020.  Patient reports chronic cigarette smoking x 40 years, previously smoked 1 PPD to 4 PPD cigarettes, but has cut back to 0.5 PPD cigarettes for the past 6 months.  He denies any history of sleep apnea but admits to snoring and daytime hypersomnolence; has been referred for sleep medicine consult by PCP.  Does not take any diuretic medications or testosterone supplements.  He has COPD and requires 4 L oxygen at nighttime.  He denies any obvious carbon monoxide exposure, but does use woodstove at home (split-level house); reports presence of carbon monoxide monitor at home.  Denies any family history of polycythemia or myeloproliferative neoplasm.  Patient has history of left upper extremity and left lower extremity DVTs in September 2018 which occurred during 3-week hospitalization for MRSA pneumonia; evaluated by vascular surgery and treated with Eliquis x 6 months.  No other history of DVT or PE.  He currently takes aspirin 81 mg daily.  Reports symptoms of fatigue, tinnitus, and intermittent dizziness, blurry vision, and neuropathy.  He has reddish discoloration of his hands and feet.  Denies any aquagenic pruritus, Raynaud's phenomenon,  or erythromelalgia.  No strokelike symptoms.  No B symptoms such as fever, chills, night sweats, or unintentional weight loss.    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   SVT (supraventricular tachycardia)    Wears dentures    Wears glasses     SURGICAL HISTORY: Past Surgical History:  Procedure Laterality Date   ABDOMINAL AORTOGRAM W/LOWER EXTREMITY N/A 05/30/2017   Procedure: ABDOMINAL AORTOGRAM W/LOWER EXTREMITY;  Surgeon: Elam Dutch, MD;  Location: [facility];  Service: Cardiovascular;  Laterality: N/A;   ABDOMINAL AORTOGRAM W/LOWER  EXTREMITY N/A 07/12/2020   Procedure: ABDOMINAL AORTOGRAM W/LOWER EXTREMITY;  Surgeon: [name], MD;  Location: [facility];  Service: Cardiovascular;  Laterality: N/A;   ANKLE ARTHROSCOPY  01/17/2012   Procedure: ANKLE ARTHROSCOPY;  Surgeon: [name], MD;  Location: [facility];  Service: Orthopedics;  Laterality: Left;   ANKLE ARTHROSCOPY WITH FUSION Left 10/02/2016   Procedure: Left Posterior Subtalar Arthrodesis Arthroscopy;  Surgeon: [name], MD;   Location: [facility];  Service: Orthopedics;  Laterality: Left;   ANKLE FUSION Left 11/2012   Dr Leisa Lenz FUSION Right 08/09/2020   Procedure: RIGHT ANKLE FUSION;  Surgeon: [name], MD;  Location: [facility];  Service: Orthopedics;  Laterality: Right;   ANKLE SURGERY  2003   Left   BRAIN SURGERY  2009   Biopsy   COLONOSCOPY WITH PROPOFOL N/A 03/11/2019   Procedure: COLONOSCOPY WITH PROPOFOL;  Surgeon: Mauri Pole, MD;  Location: [facility];  Service: Endoscopy;  Laterality: N/A;   FRACTURE SURGERY  2003   Left ankle   HARDWARE REMOVAL Left 12/08/2012   Procedure: HARDWARE REMOVAL;  Surgeon: [name], MD;  Location: [facility];  Service: Orthopedics;  Laterality: Left;  Removal Deep Hardware, Take Down Non-Union, Revision Internal Fixation Left Ankle   I & D EXTREMITY Left 01/01/2013   Procedure: IRRIGATION AND DEBRIDEMENT EXTREMITY;  Surgeon: [name], MD;  Location: [place];  Service: Orthopedics;  Laterality: Left;  Irrigation, Debridement and Placement Antibiotic Beads Left Ankle   INNER EAR SURGERY     OPEN REDUCTION INTERNAL FIXATION (ORIF) SCAPHOID WITH DISTAL RADIUS GRAFT Right 02/01/2013   Procedure: OPEN REDUCTION INTERNAL FIXATION (ORIF) RIGHT SCAPHOID FRACTURE WITH DISTAL RADIUS GRAFT;  Surgeon: Schuyler Amor, MD;  Location: [place];  Service: Orthopedics;  Laterality: Right;   ORIF ANKLE FRACTURE Left 12/08/2012   Procedure: OPEN REDUCTION INTERNAL FIXATION (ORIF) ANKLE FRACTURE;  Surgeon: [name], MD;  Location: [place];  Service: Orthopedics;  Laterality: Left;  Removal Deep Hardware, Take Down Non-Union, Revision Internal Fixation Left Ankle   ORIF ANKLE FRACTURE Right 09/03/2013   Procedure: OPEN REDUCTION INTERNAL FIXATION (ORIF) ANKLE FRACTURE;  Surgeon: [name], MD;  Location: [facility] [place];  Service: Orthopedics;  Laterality: Right;  Open Reduction Internal Fixation Right Fibula   POLYPECTOMY  03/11/2019   Procedure: POLYPECTOMY;  Surgeon: Mauri Pole,  MD;  Location: [facility];  Service: Endoscopy;;   SCAPHOID FRACTURE SURGERY  02/04/2013   SHOULDER ARTHROSCOPY  2008   Left   TYMPANOSTOMY TUBE PLACEMENT  1993    SOCIAL HISTORY: Social History   Socioeconomic History   Marital status: Married    Spouse name: Vickie   Number of children: 1   Years of education: 12   Highest education level: High school graduate  Occupational History    Employer: NOT EMPLOYED    Comment: Disabled  Tobacco Use   Smoking status: Every Day    Packs/day: 0.50    Years: 40.00    Total pack years: 20.00    Types: Cigarettes   Smokeless tobacco: Never   Tobacco comments:    1 pack every 2 days    HEI. 09/03/2022  Vaping Use   Vaping Use: Never used  Substance and Sexual Activity   Alcohol use: No    Alcohol/week: 0.0 standard drinks of alcohol    Comment: quit Oct. 2017(heavy drinker) .In AA   Drug use: No   Sexual  activity: Not Currently  Other Topics Concern   Not on file  Social History Narrative   Patient lives at home with his wife Maxwell Caul)    Disabled   Left handed   Education 12 th    Caffeine Coffee four cups a day no tea no soda mostly water   Social Determinants of Health   Financial Resource Strain: Not on file  Food Insecurity: Not on file  Transportation Needs: Not on file  Physical Activity: Not on file  Stress: Not on file  Social Connections: Not on file  Intimate Partner Violence: Not on file    FAMILY HISTORY: Family History  Problem Relation Age of Onset   Hypertension Mother    Bronchitis Mother    Cancer Maternal Aunt    Cancer Maternal Grandmother    Healthy Sister    Healthy Brother    Stroke Neg Hx    Diabetes Neg Hx     ALLERGIES:  is allergic to hydrocodone, lyrica [pregabalin], and other.  MEDICATIONS:  Current Outpatient Medications  Medication Sig Dispense Refill   albuterol (PROVENTIL HFA;VENTOLIN HFA) 108 (90 Base) MCG/ACT inhaler Inhale 2 puffs into the lungs every 6 (six) hours as  needed for wheezing or shortness of breath. 1 Inhaler 6   allopurinol (ZYLOPRIM) 300 MG tablet Take 300 mg by mouth daily.     aspirin EC 81 MG tablet Take 81 mg by mouth daily.     benzonatate (TESSALON) 200 MG capsule Take 1 capsule (200 mg total) by mouth 3 (three) times daily as needed for cough. 60 capsule 1   hydrOXYzine (ATARAX) 50 MG tablet Take 50 mg by mouth 2 (two) times daily as needed.     levETIRAcetam (KEPPRA) 500 MG tablet TAKE 1 TABLET(500 MG) BY MOUTH TWICE DAILY 60 tablet 3   meclizine (ANTIVERT) 25 MG tablet Take 25 mg by mouth 3 (three) times daily as needed.     meloxicam (MOBIC) 7.5 MG tablet TAKE 1 TABLET(7.5 MG) BY MOUTH DAILY 90 tablet 0   metFORMIN (GLUCOPHAGE) 500 MG tablet Take 500 mg by mouth 2 (two) times daily.     metoprolol succinate (TOPROL-XL) 100 MG 24 hr tablet Take 100 mg by mouth daily. Take with or immediately following a meal.     Multiple Vitamins-Minerals (MULTIVITAMIN WITH MINERALS) tablet Take 2 tablets by mouth daily.      Naphazoline-Pheniramine (OPCON-A) 0.027-0.315 % SOLN Place 1 drop into both eyes every 8 (eight) hours as needed (red, irritated eyes).     neomycin-polymyxin-hydrocortisone (CORTISPORIN) 3.5-10000-1 OTIC suspension Place 2 drops into the left ear 2 (two) times daily as needed (ear pain).     oxyCODONE-acetaminophen (PERCOCET/ROXICET) 5-325 MG tablet Take 1 tablet by mouth every 4 (four) hours as needed. 30 tablet 0   OXYGEN Inhale into the lungs. At home oxygen     pantoprazole (PROTONIX) 40 MG tablet Take 40 mg by mouth daily.     polyethylene glycol (MIRALAX / GLYCOLAX) 17 g packet Take 17 g by mouth daily. 14 each 0   rosuvastatin (CRESTOR) 10 MG tablet Take 10 mg by mouth daily at 6 PM.     tamsulosin (FLOMAX) 0.4 MG CAPS capsule Take 0.4 mg by mouth daily.     topiramate (TOPAMAX) 25 MG tablet Take 25 mg by mouth 2 (two) times daily.     traZODone (DESYREL) 100 MG tablet Take 200 mg by mouth at bedtime.     TRELEGY ELLIPTA  100-62.5-25 MCG/INH AEPB  INHALE 1 PUFF INTO THE LUNGS DAILY (Patient taking differently: Take 1 puff by mouth daily.) 60 each 5   VITAMIN D PO Take 1 capsule by mouth daily.     VOLTAREN 1 % GEL APPLY 2 TO 4 GRAMS EXTERNALLY TO THE AFFECTED AREA TWICE DAILY (Patient taking differently: Apply 4 g topically 4 (four) times daily as needed (pain).) 500 g 0   zolpidem (AMBIEN) 5 MG tablet Take 5 mg by mouth at bedtime as needed.     No current facility-administered medications for this visit.    REVIEW OF SYSTEMS:   Review of Systems  Constitutional:  Positive for fatigue. Negative for appetite change, chills, diaphoresis, fever and unexpected weight change.  HENT:   Positive for tinnitus. Negative for lump/mass and nosebleeds.   Eyes:  Positive for eye problems (intermittent blurry vision).  Respiratory:  Positive for cough and shortness of breath. Negative for hemoptysis.   Cardiovascular:  Positive for chest pain (occasional, none today). Negative for leg swelling and palpitations.  Gastrointestinal:  Negative for abdominal pain, blood in stool, constipation, diarrhea, nausea and vomiting.  Genitourinary:  Negative for hematuria.   Musculoskeletal:  Positive for arthralgias.  Skin: Negative.   Neurological:  Positive for dizziness, headaches and numbness. Negative for light-headedness.  Hematological:  Does not bruise/bleed easily.  Psychiatric/Behavioral:  Positive for depression. The patient is nervous/anxious.       PHYSICAL EXAMINATION: ECOG PERFORMANCE STATUS: 1 - Symptomatic but completely ambulatory  There were no vitals filed for this visit. There were no vitals filed for this visit.  Physical Exam Constitutional:      Appearance: Normal appearance. He is obese.  HENT:     Head: Normocephalic and atraumatic.     Mouth/Throat:     Mouth: Mucous membranes are moist.  Eyes:     Extraocular Movements: Extraocular movements intact.     Pupils: Pupils are equal, round, and  reactive to light.  Cardiovascular:     Rate and Rhythm: Regular rhythm. Tachycardia present.     Pulses: Normal pulses.     Heart sounds: Normal heart sounds.  Pulmonary:     Effort: Pulmonary effort is normal.     Breath sounds: Normal breath sounds.  Abdominal:     General: Bowel sounds are normal.     Palpations: Abdomen is soft.     Tenderness: There is no abdominal tenderness.  Musculoskeletal:        General: No swelling.     Right lower leg: No edema.     Left lower leg: No edema.  Lymphadenopathy:     Cervical: No cervical adenopathy.  Skin:    General: Skin is warm and dry.     Findings: Erythema (Dusky erythema of hands, feet, and ears) present.  Neurological:     General: No focal deficit present.     Mental Status: He is alert and oriented to person, place, and time.  Psychiatric:        Mood and Affect: Mood normal.        Behavior: Behavior normal.      LABORATORY DATA:  I have reviewed the data as listed No results found for this or any previous visit (from the past 2160 hour(s)).  RADIOGRAPHIC STUDIES: I have personally reviewed the radiological images as listed and agreed with the findings in the report. No results found.   ASSESSMENT & PLAN: 1.  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 - Labs sent by PCP from 07/02/2022 show Hgb 19.0 and hematocrit 55.5. - Review of available prior labs shows intermittently elevated hemoglobin/hematocrit since at least 2013, with maximum Hgb 21.8/HCT 64.0 on 07/12/2020. - Current everyday cigarette smoker (0.5 PPD), previously smoked up to 4 PPD - Reports snoring and daytime hypersomnolence.  Referred to sleep medicine by PCP. - No diuretics or testosterone supplements. - PMH significant for COPD and requires 4 L oxygen at nighttime. - Denies any obvious carbon monoxide exposure, but does use  woodstove at home (split-level house); reports presence of carbon monoxide monitor at home. - Denies any family history of polycythemia or myeloproliferative neoplasm. - History of left upper extremity and left lower extremity DVTs in September 2018 which occurred during 3-week hospitalization for MRSA pneumonia; evaluated by vascular surgery and treated with Eliquis x 6 months.  No other history of DVT or PE.  He currently takes aspirin 81 mg daily. - Reports symptoms of fatigue, tinnitus, and intermittent dizziness, blurry vision, and neuropathy.  He has reddish discoloration of his hands and feet.  No aquagenic pruritus or B symptoms. - PLAN: Discussed with patient that his erythrocytosis is most likely related to his cigarette smoking and COPD. - Discussed that he may benefit from therapeutic phlebotomy to decrease symptom burden and decrease risk of thrombotic events by keeping HCT <54.0. - We will check labs today (CBC/D, erythropoietin, JAK2 with reflex, carbon monoxide) - Will see patient for same-day office visit and possible phlebotomy in 3 weeks  2.  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 - DVTs occurred during 3-week hospitalization for MRSA pneumonia - Patient was treated with Eliquis x 6 months, discontinued per vascular/vein specialist (Dr. Servando Snare) - PLAN: Continuing on aspirin 81 mg daily per recommendation of vascular surgeon.  3.  Tobacco use -  Reports chronic cigarette smoking x 40 years, previously smoked 1 PPD to 4 PPD cigarettes, but has cut back to 0.5 PPD cigarettes for the past 6 months.  - PLAN: Need for LDCT scan has  been addressed by his pulmonology office   4.  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  Lives at home with his wife and son. - TOBACCO: Current smoker, 0.5 PPD cigarettes - ALCOHOL: Drinks approximately 80 ounces of beer daily.  Previously drank 12+ beers daily. - DRUGS: Patient denies illicit drug use. - FAMILY: Patient's father had colorectal cancer.  No family history of myeloproliferative neoplasm.   PLAN SUMMARY: >> Labs TODAY (CBC/D, erythropoietin, JAK2 with reflex, carbon monoxide) >> OFFICE visit + same-day phlebotomy in 3 weeks   All questions were answered. The patient knows to call the clinic with any problems, questions or concerns.   Medical decision making: Moderate  Time spent on visit: I spent 30 minutes counseling the patient face to face. The total time spent in the appointment was 55 minutes and more than 50% was on counseling.  I, Tarri Abernethy PA-C, have seen this  patient in conjunction with Dr. Derek Jack.  Greater than 50% of visit was performed by Dr. Delton Coombes.   Harriett Rush, PA-C 09/24/2022 3:24 PM  DR. Alora Gorey: I have independently evaluated this patient and formulated my assessment and plan.  I agree with HPI, assessment and plan written by Casey Burkitt, PA-C.  Patient being evaluated for erythrocytosis since August 2019.  Current active smoker and has COPD.  Not on testosterone supplements.  He had a history of provoked left upper extremity DVT in 2018 during hospitalization.  He was treated with 6 months of Eliquis.  He has some headaches, tinnitus, intermittent dizziness.  We will check for clonal myeloproliferative neoplasms.  He will follow-up in 2 to 3 weeks.

## 2022-09-24 ENCOUNTER — Inpatient Hospital Stay: Payer: 59 | Attending: Hematology | Admitting: Hematology

## 2022-09-24 ENCOUNTER — Inpatient Hospital Stay: Payer: 59

## 2022-09-24 VITALS — BP 143/89 | HR 107 | Temp 98.4°F | Resp 18 | Ht 72.0 in | Wt 216.0 lb

## 2022-09-24 DIAGNOSIS — J449 Chronic obstructive pulmonary disease, unspecified: Secondary | ICD-10-CM | POA: Diagnosis not present

## 2022-09-24 DIAGNOSIS — I10 Essential (primary) hypertension: Secondary | ICD-10-CM | POA: Diagnosis not present

## 2022-09-24 DIAGNOSIS — F17210 Nicotine dependence, cigarettes, uncomplicated: Secondary | ICD-10-CM | POA: Diagnosis not present

## 2022-09-24 DIAGNOSIS — K219 Gastro-esophageal reflux disease without esophagitis: Secondary | ICD-10-CM | POA: Diagnosis not present

## 2022-09-24 DIAGNOSIS — Z86718 Personal history of other venous thrombosis and embolism: Secondary | ICD-10-CM | POA: Insufficient documentation

## 2022-09-24 DIAGNOSIS — Z7982 Long term (current) use of aspirin: Secondary | ICD-10-CM

## 2022-09-24 DIAGNOSIS — Z9981 Dependence on supplemental oxygen: Secondary | ICD-10-CM

## 2022-09-24 DIAGNOSIS — E1159 Type 2 diabetes mellitus with other circulatory complications: Secondary | ICD-10-CM

## 2022-09-24 DIAGNOSIS — Z79899 Other long term (current) drug therapy: Secondary | ICD-10-CM | POA: Diagnosis not present

## 2022-09-24 DIAGNOSIS — Z800 Family history of malignant neoplasm of digestive organs: Secondary | ICD-10-CM | POA: Insufficient documentation

## 2022-09-24 DIAGNOSIS — D751 Secondary polycythemia: Secondary | ICD-10-CM | POA: Diagnosis present

## 2022-09-24 LAB — CBC WITH DIFFERENTIAL/PLATELET
Abs Immature Granulocytes: 0.08 10*3/uL — ABNORMAL HIGH (ref 0.00–0.07)
Basophils Absolute: 0.1 10*3/uL (ref 0.0–0.1)
Basophils Relative: 1 %
Eosinophils Absolute: 0.2 10*3/uL (ref 0.0–0.5)
Eosinophils Relative: 2 %
HCT: 55.5 % — ABNORMAL HIGH (ref 39.0–52.0)
Hemoglobin: 18.7 g/dL — ABNORMAL HIGH (ref 13.0–17.0)
Immature Granulocytes: 1 %
Lymphocytes Relative: 25 %
Lymphs Abs: 2.1 10*3/uL (ref 0.7–4.0)
MCH: 33.8 pg (ref 26.0–34.0)
MCHC: 33.7 g/dL (ref 30.0–36.0)
MCV: 100.2 fL — ABNORMAL HIGH (ref 80.0–100.0)
Monocytes Absolute: 0.8 10*3/uL (ref 0.1–1.0)
Monocytes Relative: 9 %
Neutro Abs: 5.2 10*3/uL (ref 1.7–7.7)
Neutrophils Relative %: 62 %
Platelets: 177 10*3/uL (ref 150–400)
RBC: 5.54 MIL/uL (ref 4.22–5.81)
RDW: 13.3 % (ref 11.5–15.5)
WBC: 8.3 10*3/uL (ref 4.0–10.5)
nRBC: 0 % (ref 0.0–0.2)

## 2022-09-24 NOTE — Patient Instructions (Signed)
[facility] **   You were seen today by Dr. Delton Coombes & Tarri Abernethy PA-C for your elevated red blood cells/elevated hemoglobin.   Your elevated red blood cell/hemoglobin are most likely related to your COPD and tobacco/cigarette smoking. Will check labs today to see if you have any other abnormalities that would cause your high blood counts. We will see you for follow-up visit in 3 weeks.   ** Thank you for trusting me with your healthcare!  I strive to provide all of my patients with quality care at each visit.  If you receive a survey for this visit, I would be so grateful to you for taking the time to provide feedback.  Thank you in advance!  ~ Jennfier Abdulla                   Dr. Derek Jack   &   Tarri Abernethy, PA-C   - - - - - - - - - - - - - - - - - -    Thank you for choosing [facility] at [facility] to provide your oncology and hematology care.  To afford each patient quality time with our provider, please arrive at least 15 minutes before your scheduled appointment time.   If you have a lab appointment with the [facility] please come in thru the Main Entrance and check in at the main information desk.  You need to re-schedule your appointment should you arrive 10 or more minutes late.  We strive to give you quality time with our providers, and arriving late affects you and other patients whose appointments are after yours.  Also, if you no show three or more times for appointments you may be dismissed from the clinic at the providers discretion.     Again, thank you for choosing [facility].  Our hope is that these requests will decrease the amount of time that you wait before being seen by our physicians.       _____________________________________________________________  Should you have questions after your visit to [facility], please  contact our office at [phone] and follow the prompts.  Our office hours are 8:00 a.m. and 4:30 p.m. Monday - Friday.  Please note that voicemails left after 4:00 p.m. may not be returned until the following business day.  We are closed weekends and major holidays.  You do have access to a nurse 24-7, just call the main number to the clinic [phone] and do not press any options, hold on the line and a nurse will answer the phone.    For prescription refill requests, have your pharmacy contact our office and allow 72 hours.

## 2022-09-25 LAB — ERYTHROPOIETIN: Erythropoietin: 20.4 m[IU]/mL — ABNORMAL HIGH (ref 2.6–18.5)

## 2022-09-26 LAB — CARBON MONOXIDE, BLOOD (PERFORMED AT REF LAB): Carbon Monoxide, Blood: 16.4 % — ABNORMAL HIGH (ref 0.0–3.6)

## 2022-10-02 LAB — CALR +MPL + E12-E15  (REFLEX)

## 2022-10-02 LAB — JAK2 V617F RFX CALR/MPL/E12-15

## 2022-10-03 ENCOUNTER — Encounter (HOSPITAL_BASED_OUTPATIENT_CLINIC_OR_DEPARTMENT_OTHER): Payer: Self-pay

## 2022-10-03 DIAGNOSIS — G4733 Obstructive sleep apnea (adult) (pediatric): Secondary | ICD-10-CM

## 2022-10-03 DIAGNOSIS — G4710 Hypersomnia, unspecified: Secondary | ICD-10-CM

## 2022-10-03 DIAGNOSIS — R0683 Snoring: Secondary | ICD-10-CM

## 2022-10-11 DIAGNOSIS — G43909 Migraine, unspecified, not intractable, without status migrainosus: Secondary | ICD-10-CM | POA: Insufficient documentation

## 2022-10-11 DIAGNOSIS — F419 Anxiety disorder, unspecified: Secondary | ICD-10-CM | POA: Insufficient documentation

## 2022-10-11 DIAGNOSIS — J0100 Acute maxillary sinusitis, unspecified: Secondary | ICD-10-CM | POA: Insufficient documentation

## 2022-10-11 DIAGNOSIS — R079 Chest pain, unspecified: Secondary | ICD-10-CM | POA: Insufficient documentation

## 2022-10-15 NOTE — Progress Notes (Unsigned)
VIRTUAL VISIT via [place]   I connected with Harold Bailey  on 10/16/22 at  8:40 AM by telephone and verified that I am speaking with the correct person using two identifiers.  Location: Patient: Home Provider: [facility]   I discussed the limitations, risks, security and privacy concerns of performing an evaluation and management service by telephone and the availability of in person appointments. I also discussed with the patient that there may be a patient responsible charge related to this service. The patient expressed understanding and agreed to proceed.  REASON FOR VISIT: Erythrocytosis (secondary polycythemia)  PRIOR THERAPY: None  CURRENT THERAPY: Under workup  INTERVAL HISTORY:  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  He was seen for initial workup by Dr. Delton Coombes and Tarri Abernethy PA-C on 09/24/2022.  At today's visit, he reports feeling well.  He denies any changes in his symptoms or health status since his initial visit 3 weeks ago.  He continues to report symptoms of fatigue, tinnitus, and intermittent dizziness, blurry vision, and neuropathy.  He has reddish discoloration of his hands and feet.  No aquagenic pruritus or B symptoms.  He reports 85% energy and 100% appetite.  Reports that he is maintaining a stable weight.  REVIEW OF SYSTEMS:   Review of Systems  Constitutional:  Positive for malaise/fatigue. Negative for chills, diaphoresis, fever and weight loss.  HENT:  Positive for tinnitus.   Eyes:  Positive for blurred vision (intermittent).  Respiratory:  Positive for cough. Negative for shortness of breath.   Cardiovascular:  Negative for chest pain and palpitations.  Gastrointestinal:  Negative for abdominal pain, blood in stool, melena, nausea and vomiting.  Neurological:  Positive for dizziness and headaches.  Psychiatric/Behavioral:   Positive for depression. The patient is nervous/anxious.      PHYSICAL EXAM: (per limitations of virtual telephone visit)  The patient is alert and oriented x 3, exhibiting adequate mentation, good mood, and ability to speak in full sentences and execute sound judgement.  ASSESSMENT & PLAN:  1.  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 - Intermittent 20 elevated hemoglobin/hematocrit since at least 2013, with maximum Hgb 21.8/HCT 64.0 on 07/12/2020. - Current everyday cigarette smoker (0.5 PPD), previously smoked up to 4 PPD - Reports snoring and daytime hypersomnolence.  Referred to sleep medicine by PCP (scheduled for February) - No diuretics or testosterone supplements. - PMH significant for COPD and requires 4 L oxygen at nighttime. - Denies any obvious carbon monoxide exposure, but does use woodstove at home (split-level house); reports presence of carbon monoxide monitor at home. - Denies any family history of polycythemia or myeloproliferative neoplasm. 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  He has reddish discoloration of his hands and feet.  No aquagenic pruritus or B symptoms. - DIFFERENTIAL DIAGNOSIS: Erythrocytosis most likely related to cigarette smoking and COPD. - PLAN: Patient may benefit from therapeutic phlebotomy to decrease symptom burden and decrease risk of thrombotic events by keeping  HCT <54.0. -- Recommend fire department evaluation of home carbon  monoxide levels. -- Continue aspirin 81 mg daily - We will schedule him for monthly phlebotomy x 2 - Same-day labs (CBC) + OFFICE visit in 2 months  -- We will recheck erythropoietin in 3-6 months   2.  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 - DVTs occurred during 3-week hospitalization for MRSA pneumonia - Patient was treated with Eliquis x 6 months, discontinued per vascular/vein specialist (Dr. Servando Snare) - Patient continues on aspirin 81 mg daily per recommendation of vascular surgeon   3.  Tobacco use -  Reports chronic cigarette smoking x 40 years, previously smoked 1 PPD to 4 PPD cigarettes, but has cut back to 0.5 PPD cigarettes for the past 6 months. - Need for LDCT scan has been addressed by his pulmonology office, last performed in March 2023 (lung RADS 1 negative)   4.  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  Lives at home with his wife and son. - TOBACCO: Current smoker, 0.5 PPD cigarettes - ALCOHOL: Drinks approximately 80 ounces of beer daily.  Previously drank 12+ beers daily. - DRUGS: Patient denies illicit drug use. - FAMILY: Patient's father had colorectal cancer.  No family history of myeloproliferative neoplasm.  PLAN SUMMARY: >> Monthly phlebotomy x 2 >> Same-day labs (CBC) + OFFICE visit in 2 months     I discussed the assessment and treatment plan with the patient. The patient  was provided an opportunity to ask questions and all were answered. The patient agreed with the plan and demonstrated an understanding of the instructions.   The patient was advised to call back or seek an in-person evaluation if the symptoms worsen or if the condition fails to improve as anticipated.  I provided 16 minutes of non-face-to-face time during this encounter.  Harriett Rush, PA-C 10/16/22 8:50 AM

## 2022-10-16 ENCOUNTER — Inpatient Hospital Stay (HOSPITAL_BASED_OUTPATIENT_CLINIC_OR_DEPARTMENT_OTHER): Payer: 59 | Admitting: Physician Assistant

## 2022-10-16 ENCOUNTER — Encounter: Payer: Self-pay | Admitting: Physician Assistant

## 2022-10-16 ENCOUNTER — Other Ambulatory Visit: Payer: Self-pay

## 2022-10-16 DIAGNOSIS — D751 Secondary polycythemia: Secondary | ICD-10-CM

## 2022-10-25 ENCOUNTER — Inpatient Hospital Stay: Payer: 59

## 2022-11-04 ENCOUNTER — Ambulatory Visit: Payer: 59 | Attending: Nurse Practitioner | Admitting: Pulmonary Disease

## 2022-11-04 DIAGNOSIS — G4733 Obstructive sleep apnea (adult) (pediatric): Secondary | ICD-10-CM | POA: Diagnosis present

## 2022-11-04 DIAGNOSIS — G4736 Sleep related hypoventilation in conditions classified elsewhere: Secondary | ICD-10-CM | POA: Insufficient documentation

## 2022-11-04 DIAGNOSIS — R0683 Snoring: Secondary | ICD-10-CM | POA: Diagnosis not present

## 2022-11-04 DIAGNOSIS — Z9981 Dependence on supplemental oxygen: Secondary | ICD-10-CM | POA: Diagnosis not present

## 2022-11-04 DIAGNOSIS — J449 Chronic obstructive pulmonary disease, unspecified: Secondary | ICD-10-CM | POA: Insufficient documentation

## 2022-11-04 DIAGNOSIS — R0902 Hypoxemia: Secondary | ICD-10-CM | POA: Insufficient documentation

## 2022-11-04 DIAGNOSIS — G4710 Hypersomnia, unspecified: Secondary | ICD-10-CM | POA: Diagnosis not present

## 2022-11-04 DIAGNOSIS — D751 Secondary polycythemia: Secondary | ICD-10-CM | POA: Insufficient documentation

## 2022-11-04 DIAGNOSIS — G4734 Idiopathic sleep related nonobstructive alveolar hypoventilation: Secondary | ICD-10-CM

## 2022-11-06 ENCOUNTER — Inpatient Hospital Stay: Payer: 59 | Attending: Hematology

## 2022-11-06 VITALS — BP 115/85 | HR 87 | Temp 97.1°F | Resp 16

## 2022-11-06 DIAGNOSIS — D751 Secondary polycythemia: Secondary | ICD-10-CM | POA: Insufficient documentation

## 2022-11-06 NOTE — Progress Notes (Signed)
Harold Bailey presents today for phlebotomy per MD orders. Phlebotomy procedure started at 1453 and ended at 1500. 500 cc removed. Patient tolerated procedure well. IV needle removed intact.  Patient refused to stay wait time. Labs and schedule printed for patient. Tarri Abernethy, PA made aware that next phlebotomy was scheduled in 2 weeks, patient okay to keep appointment as scheduled.

## 2022-11-06 NOTE — Patient Instructions (Signed)
[facility]  Discharge Instructions: Thank you for choosing [facility] to provide your oncology and hematology care.  If you have a lab appointment with the [facility], please come in thru the Main Entrance and check in at the main information desk.  Wear comfortable clothing and clothing appropriate for easy access to any Portacath or PICC line.   We strive to give you quality time with your provider. You may need to reschedule your appointment if you arrive late (15 or more minutes).  Arriving late affects you and other patients whose appointments are after yours.  Also, if you miss three or more appointments without notifying the office, you may be dismissed from the clinic at the provider's discretion.      For prescription refill requests, have your pharmacy contact our office and allow 72 hours for refills to be completed.    Today you received the following Therapeutic Phlebotomy, return as scheduled.   To help prevent nausea and vomiting after your treatment, we encourage you to take your nausea medication as directed.  BELOW ARE SYMPTOMS THAT SHOULD BE REPORTED IMMEDIATELY: *FEVER GREATER THAN 100.4 F (38 C) OR HIGHER *CHILLS OR SWEATING *NAUSEA AND VOMITING THAT IS NOT CONTROLLED WITH YOUR NAUSEA MEDICATION *UNUSUAL SHORTNESS OF BREATH *UNUSUAL BRUISING OR BLEEDING *URINARY PROBLEMS (pain or burning when urinating, or frequent urination) *BOWEL PROBLEMS (unusual diarrhea, constipation, pain near the anus) TENDERNESS IN MOUTH AND THROAT WITH OR WITHOUT PRESENCE OF ULCERS (sore throat, sores in mouth, or a toothache) UNUSUAL RASH, SWELLING OR PAIN  UNUSUAL VAGINAL DISCHARGE OR ITCHING   Items with * indicate a potential emergency and should be followed up as soon as possible or go to the Emergency Department if any problems should occur.  Please show the CHEMOTHERAPY ALERT CARD or IMMUNOTHERAPY ALERT CARD at check-in to the Emergency  Department and triage nurse.  Should you have questions after your visit or need to cancel or reschedule your appointment, please contact [facility] [phone]  and follow the prompts.  Office hours are 8:00 a.m. to 4:30 p.m. Monday - Friday. Please note that voicemails left after 4:00 p.m. may not be returned until the following business day.  We are closed weekends and major holidays. You have access to a nurse at all times for urgent questions. Please call the main number to the clinic [phone] and follow the prompts.  For any non-urgent questions, you may also contact your provider using MyChart. We now offer e-Visits for anyone 54 and older to request care online for non-urgent symptoms. For details visit [email or web address].   Also download the MyChart app! Go to the app store, search "MyChart", open the app, select [facility], and log in with your MyChart username and password.

## 2022-11-11 ENCOUNTER — Telehealth: Payer: Self-pay | Admitting: Pulmonary Disease

## 2022-11-11 DIAGNOSIS — R0683 Snoring: Secondary | ICD-10-CM | POA: Diagnosis not present

## 2022-11-11 DIAGNOSIS — G4734 Idiopathic sleep related nonobstructive alveolar hypoventilation: Secondary | ICD-10-CM | POA: Diagnosis not present

## 2022-11-11 DIAGNOSIS — J432 Centrilobular emphysema: Secondary | ICD-10-CM

## 2022-11-11 DIAGNOSIS — J9611 Chronic respiratory failure with hypoxia: Secondary | ICD-10-CM

## 2022-11-11 NOTE — Telephone Encounter (Signed)
Sleep study did not show significant OSA but showed severe oxygen desaturation He should continue using oxygen during sleep 2 L Check nocturnal oximetry on 2 L during sleep [or what ever level he has been using]

## 2022-11-11 NOTE — Procedures (Signed)
Patient Name: Harold Bailey, Harold Bailey Date: 11/04/2022 Gender: Male D.O.B: 1966/03/15 Age (years): 69 Referring Provider: Gwen Her Hyler NP Height (inches): 72 Interpreting Physician: Kara Mead MD, ABSM Weight (lbs): 216 RPSGT: Rosebud Poles BMI: 29 MRN: [id number] Neck Size: 17.50 <br> <br> CLINICAL INFORMATION Sleep Study Type: NPSG   Indication for sleep study: copd on nocturnal oxygen, polycythemia, snoring, restless sleep   Epworth Sleepiness Score: 7   SLEEP STUDY TECHNIQUE As per the AASM Manual for the Scoring of Sleep and Associated Events v2.3 (April 2016) with a hypopnea requiring 4% desaturations.  The channels recorded and monitored were frontal, central and occipital EEG, electrooculogram (EOG), submentalis EMG (chin), nasal and oral airflow, thoracic and abdominal wall motion, anterior tibialis EMG, snore microphone, electrocardiogram, and pulse oximetry.  MEDICATIONS Medications self-administered by patient taken the night of the study : N/A  SLEEP ARCHITECTURE The study was initiated at 10:02:26 PM and ended at 4:34:47 AM.  Sleep onset time was 22.3 minutes and the sleep efficiency was 90.1%. The total sleep time was 353.6 minutes.  Stage REM latency was 128.5 minutes.  The patient spent 3.25% of the night in stage N1 sleep, 41.60% in stage N2 sleep, 34.93% in stage N3 and 20.2% in REM.  Alpha intrusion was absent.  Supine sleep was 0.00%.  RESPIRATORY PARAMETERS The overall apnea/hypopnea index (AHI) was 0.5 per hour. There were 0 total apneas, including 0 obstructive, 0 central and 0 mixed apneas. There were 3 hypopneas and 0 RERAs.  The AHI during Stage REM sleep was 0.8 per hour.  AHI while supine was N/A per hour.  The mean oxygen saturation was 87.27%. The minimum SpO2 during sleep was 82.00%.  moderate snoring was noted during this study.  CARDIAC DATA The 2 lead EKG demonstrated sinus rhythm. The mean heart rate was 92.78 beats per  minute. Other EKG findings include: None.   LEG MOVEMENT DATA The total PLMS were 0 with a resulting PLMS index of 0.00. Associated arousal with leg movement index was 0.0 .  IMPRESSIONS - No significant obstructive sleep apnea occurred during this study (AHI = 0.5/h). - Moderate oxygen desaturation was noted during this study (Min O2 = 82.00%). 1L O2 was added at lights off. Stas were less than 88% for 294 mins of sleep time. - The patient snored with moderate snoring volume. Sleep talking was noted. - No cardiac abnormalities were noted during this study. - Clinically significant periodic limb movements did not occur during sleep. No significant associated arousals.  DIAGNOSIS - Nocturnal Hypoxemia (G47.36)   RECOMMENDATIONS - Increase nocturnal O2 to 2-3 Lpm & recheck with oximetry. - Avoid alcohol, sedatives and other CNS depressants that may worsen sleep apnea and disrupt normal sleep architecture. - Sleep hygiene should be reviewed to assess factors that may improve sleep quality. - Weight management and regular exercise should be initiated or continued if appropriate.   Kara Mead MD Board Certified in [place]

## 2022-11-11 NOTE — Telephone Encounter (Signed)
Called and went over results with patient.  He voiced understanding.  He states he is currently using 4LO2 at night time and when he naps. Will order ONO for 4LO2

## 2022-11-22 ENCOUNTER — Inpatient Hospital Stay: Payer: 59 | Attending: Hematology

## 2022-11-22 DIAGNOSIS — D751 Secondary polycythemia: Secondary | ICD-10-CM | POA: Insufficient documentation

## 2022-11-22 NOTE — Progress Notes (Signed)
Harold Bailey presents today for phlebotomy per MD orders. Phlebotomy procedure started at 1423 and ended at 1429. 500 mls  removed. Patient did not want to wait his wait time today. Patient stated he feels fine, Patient vitas stable at this time.  Patient tolerated procedure well. IV needle removed intact. Discharged home from clinic ambulatory. Follow up as scheduled.

## 2022-11-22 NOTE — Patient Instructions (Signed)
[facility]  Discharge Instructions: Thank you for choosing [facility] to provide your oncology and hematology care.  If you have a lab appointment with the [facility], please come in thru the Main Entrance and check in at the main information desk.  Wear comfortable clothing and clothing appropriate for easy access to any Portacath or PICC line.   We strive to give you quality time with your provider. You may need to reschedule your appointment if you arrive late (15 or more minutes).  Arriving late affects you and other patients whose appointments are after yours.  Also, if you miss three or more appointments without notifying the office, you may be dismissed from the clinic at the provider's discretion.      For prescription refill requests, have your pharmacy contact our office and allow 72 hours for refills to be completed.    Today you had your phlebotomy done   To help prevent nausea and vomiting after your treatment, we encourage you to take your nausea medication as directed.  BELOW ARE SYMPTOMS THAT SHOULD BE REPORTED IMMEDIATELY: *FEVER GREATER THAN 100.4 F (38 C) OR HIGHER *CHILLS OR SWEATING *NAUSEA AND VOMITING THAT IS NOT CONTROLLED WITH YOUR NAUSEA MEDICATION *UNUSUAL SHORTNESS OF BREATH *UNUSUAL BRUISING OR BLEEDING *URINARY PROBLEMS (pain or burning when urinating, or frequent urination) *BOWEL PROBLEMS (unusual diarrhea, constipation, pain near the anus) TENDERNESS IN MOUTH AND THROAT WITH OR WITHOUT PRESENCE OF ULCERS (sore throat, sores in mouth, or a toothache) UNUSUAL RASH, SWELLING OR PAIN  UNUSUAL VAGINAL DISCHARGE OR ITCHING   Items with * indicate a potential emergency and should be followed up as soon as possible or go to the Emergency Department if any problems should occur.  Please show the CHEMOTHERAPY ALERT CARD or IMMUNOTHERAPY ALERT CARD at check-in to the Emergency Department and triage nurse.  Should you have  questions after your visit or need to cancel or reschedule your appointment, please contact [facility] [phone]  and follow the prompts.  Office hours are 8:00 a.m. to 4:30 p.m. Monday - Friday. Please note that voicemails left after 4:00 p.m. may not be returned until the following business day.  We are closed weekends and major holidays. You have access to a nurse at all times for urgent questions. Please call the main number to the clinic [phone] and follow the prompts.  For any non-urgent questions, you may also contact your provider using MyChart. We now offer e-Visits for anyone 73 and older to request care online for non-urgent symptoms. For details visit [email or web address].   Also download the MyChart app! Go to the app store, search "MyChart", open the app, select [facility], and log in with your MyChart username and password.

## 2022-12-06 ENCOUNTER — Ambulatory Visit (HOSPITAL_COMMUNITY)
Admission: RE | Admit: 2022-12-06 | Discharge: 2022-12-06 | Disposition: A | Discharge status: Home / Self Care | Payer: 59 | Source: Ambulatory Visit | Attending: Acute Care | Admitting: Acute Care

## 2022-12-06 DIAGNOSIS — F17210 Nicotine dependence, cigarettes, uncomplicated: Secondary | ICD-10-CM | POA: Diagnosis not present

## 2022-12-10 ENCOUNTER — Other Ambulatory Visit: Payer: Self-pay | Admitting: Acute Care

## 2022-12-10 DIAGNOSIS — F17210 Nicotine dependence, cigarettes, uncomplicated: Secondary | ICD-10-CM

## 2022-12-10 DIAGNOSIS — Z122 Encounter for screening for malignant neoplasm of respiratory organs: Secondary | ICD-10-CM

## 2022-12-10 DIAGNOSIS — Z87891 Personal history of nicotine dependence: Secondary | ICD-10-CM

## 2022-12-11 ENCOUNTER — Telehealth: Payer: Self-pay | Admitting: Pulmonary Disease

## 2022-12-11 DIAGNOSIS — J9611 Chronic respiratory failure with hypoxia: Secondary | ICD-10-CM

## 2022-12-11 DIAGNOSIS — J432 Centrilobular emphysema: Secondary | ICD-10-CM

## 2022-12-11 NOTE — Telephone Encounter (Signed)
Called patient again and he handed phone to [place].  Vickie states he was wearing 4Lo2 during the ONO.   She asked that we call her if we are going to do any further testing and it is ok to leave her a vm with recommendations at [phone].   Patient voiced that he was agreeable to this.

## 2022-12-11 NOTE — Addendum Note (Signed)
Addended by: Fritzi Mandes D on: 12/11/2022 03:49 PM   Modules accepted: Orders

## 2022-12-11 NOTE — Telephone Encounter (Signed)
ATC Vickie.  Left her a vm and let her know that we are going to place an order to have ONO repeated on 4LO2 and to call back for questions or concerns.  Order placed for adapt to repeat ONO on 4LO2

## 2022-12-11 NOTE — Telephone Encounter (Signed)
ONO showed sig desat for 5h during sleep Did he use O2 during this study - stated as being on 4 L O2?

## 2022-12-11 NOTE — Telephone Encounter (Signed)
ATC patient.  LMTCB. 

## 2022-12-14 DIAGNOSIS — E119 Type 2 diabetes mellitus without complications: Secondary | ICD-10-CM | POA: Insufficient documentation

## 2022-12-14 DIAGNOSIS — M109 Gout, unspecified: Secondary | ICD-10-CM | POA: Insufficient documentation

## 2022-12-14 DIAGNOSIS — G894 Chronic pain syndrome: Secondary | ICD-10-CM | POA: Insufficient documentation

## 2022-12-23 ENCOUNTER — Inpatient Hospital Stay (HOSPITAL_BASED_OUTPATIENT_CLINIC_OR_DEPARTMENT_OTHER): Payer: 59 | Admitting: Physician Assistant

## 2022-12-23 ENCOUNTER — Inpatient Hospital Stay: Payer: 59 | Attending: Hematology

## 2022-12-23 ENCOUNTER — Ambulatory Visit: Payer: 59 | Admitting: Physician Assistant

## 2022-12-23 ENCOUNTER — Inpatient Hospital Stay: Payer: 59

## 2022-12-23 ENCOUNTER — Encounter: Payer: Self-pay | Admitting: Physician Assistant

## 2022-12-23 ENCOUNTER — Other Ambulatory Visit: Payer: Self-pay

## 2022-12-23 ENCOUNTER — Other Ambulatory Visit: Payer: 59

## 2022-12-23 VITALS — BP 122/85 | HR 70 | Temp 97.7°F | Resp 18 | Ht 71.26 in | Wt 208.3 lb

## 2022-12-23 DIAGNOSIS — D751 Secondary polycythemia: Secondary | ICD-10-CM | POA: Diagnosis not present

## 2022-12-23 DIAGNOSIS — Z79899 Other long term (current) drug therapy: Secondary | ICD-10-CM | POA: Diagnosis not present

## 2022-12-23 LAB — CBC
HCT: 53.2 % — ABNORMAL HIGH (ref 39.0–52.0)
Hemoglobin: 17.2 g/dL — ABNORMAL HIGH (ref 13.0–17.0)
MCH: 32 pg (ref 26.0–34.0)
MCHC: 32.3 g/dL (ref 30.0–36.0)
MCV: 98.9 fL (ref 80.0–100.0)
Platelets: 231 10*3/uL (ref 150–400)
RBC: 5.38 MIL/uL (ref 4.22–5.81)
RDW: 13.4 % (ref 11.5–15.5)
WBC: 9.1 10*3/uL (ref 4.0–10.5)
nRBC: 0 % (ref 0.0–0.2)

## 2022-12-23 NOTE — Patient Instructions (Signed)
[facility]  Discharge Instructions: Thank you for choosing [facility] to provide your oncology and hematology care.  If you have a lab appointment with the Cancer Center, please come in thru the Main Entrance and check in at the main information desk.  Wear comfortable clothing and clothing appropriate for easy access to any Portacath or PICC line.   We strive to give you quality time with your provider. You may need to reschedule your appointment if you arrive late (15 or more minutes).  Arriving late affects you and other patients whose appointments are after yours.  Also, if you miss three or more appointments without notifying the office, you may be dismissed from the clinic at the provider's discretion.      For prescription refill requests, have your pharmacy contact our office and allow 72 hours for refills to be completed.     To help prevent nausea and vomiting after your treatment, we encourage you to take your nausea medication as directed.  BELOW ARE SYMPTOMS THAT SHOULD BE REPORTED IMMEDIATELY: *FEVER GREATER THAN 100.4 F (38 C) OR HIGHER *CHILLS OR SWEATING *NAUSEA AND VOMITING THAT IS NOT CONTROLLED WITH YOUR NAUSEA MEDICATION *UNUSUAL SHORTNESS OF BREATH *UNUSUAL BRUISING OR BLEEDING *URINARY PROBLEMS (pain or burning when urinating, or frequent urination) *BOWEL PROBLEMS (unusual diarrhea, constipation, pain near the anus) TENDERNESS IN MOUTH AND THROAT WITH OR WITHOUT PRESENCE OF ULCERS (sore throat, sores in mouth, or a toothache) UNUSUAL RASH, SWELLING OR PAIN  UNUSUAL VAGINAL DISCHARGE OR ITCHING   Items with * indicate a potential emergency and should be followed up as soon as possible or go to the Emergency Department if any problems should occur.  Please show the CHEMOTHERAPY ALERT CARD or IMMUNOTHERAPY ALERT CARD at check-in to the Emergency Department and triage nurse.  Should you have questions after your visit or need to  cancel or reschedule your appointment, please contact [facility] [phone]  and follow the prompts.  Office hours are 8:00 a.m. to 4:30 p.m. Monday - Friday. Please note that voicemails left after 4:00 p.m. may not be returned until the following business day.  We are closed weekends and major holidays. You have access to a nurse at all times for urgent questions. Please call the main number to the clinic [phone] and follow the prompts.  For any non-urgent questions, you may also contact your provider using MyChart. We now offer e-Visits for anyone 18 and older to request care online for non-urgent symptoms. For details visit [email or web address].   Also download the MyChart app! Go to the app store, search "MyChart", open the app, select [facility], and log in with your MyChart username and password.   

## 2022-12-23 NOTE — Progress Notes (Signed)
[facility] [place], [place] [address]   CLINIC:  Medical Oncology/Hematology  PCP:  Yves Dill, NP [place] [address] [phone]   REASON FOR VISIT:  Follow-up for secondary polycythemia  CURRENT THERAPY: Intermittent phlebotomy + aspirin 81 mg daily  INTERVAL HISTORY:   Harold Bailey 57 y.o. male returns for routine follow-up of secondary polycythemia.  He was last evaluated via telemedicine visit by Tarri Abernethy PA-C on 10/16/2022.  At today's visit, he reports feeling fair.  Since his last visit, he received monthly phlebotomy x 2 (11/06/2022 and 11/22/2022), which he tolerated well.  He continues to have daily headaches (follows with neurology), but reports that these are slightly improved after phlebotomy.  He also notices some improvement in his energy.  He continues to have unchanged tinnitus, dizziness, intermittent blurry vision, and nocturnal neuropathy.  He has occasional reddish discoloration of his feet, but this is improved from before.  He denies any aquagenic pruritus or B symptoms.  He reports smoking 1 PPD cigarettes.  He has 50% energy and 100% appetite. He endorses that he is maintaining a stable weight.  ASSESSMENT & PLAN:  1.  Erythrocytosis - Patient seen at the request of his PCP (FNP Denyce Robert of the [facility]) for workup and management of suspected secondary polycythemia. - Intermittent 20 elevated hemoglobin/hematocrit since at least 2013, with maximum Hgb 21.8/HCT 64.0 on 07/12/2020. - Current everyday cigarette smoker (0.5-1.0 PPD), previously smoked up to 4 PPD - Reports snoring and daytime hypersomnolence.  Referred to sleep medicine by PCP (scheduled for February) - No diuretics or testosterone supplements. - PMH significant for COPD and requires 4 L oxygen at nighttime. - Denies any obvious carbon monoxide exposure, but does use woodstove at home (split-level  house); reports presence of carbon monoxide monitor at home. - Denies any family history of polycythemia or myeloproliferative neoplasm. - History of left upper extremity and left lower extremity DVTs in September 2018 (see below) - Hematology workup (09/24/2022): Mutational analysis NEGATIVE for JAK2, CALR, MPL, exons 12-15 Erythropoietin mildly elevated at 20.4 Carbon monoxide elevated at 16.4, above upper limit of normal for smokers - CBC (09/24/2022): Hgb 18.7/hematocrit 55.5% - Received monthly phlebotomy x 2 (11/06/2022 and 11/22/2022) due to high symptom burden and HCT >55.0 - Reports symptoms somewhat improved after phlebotomy.  Continues to have daily headaches, tinnitus, dizziness, blurry vision, reddish discoloration of his feet.  No aquagenic pruritus or B symptoms. - Labs today (12/23/2022): Hgb 17.2/hematocrit 53.2% - DIFFERENTIAL DIAGNOSIS: Erythrocytosis most likely related to cigarette smoking and COPD. - PLAN: Patient will likely continue to benefit from therapeutic phlebotomy to decrease symptom burden and decrease risk of thrombotic events by keeping HCT <52-54.0. -- CBC + phlebotomy every 6 weeks - Labs (same day as phlebotomy) in 4 months with PHONE visit the week after -- Continue aspirin 81 mg daily - Continued to encourage smoking cessation   2.  History of DVT, provoked - Vascular US upper extremity (05/22/2017): Findings consistent with subacute deep vein thrombosis involving the subclavian, axillary, and brachial veins of left upper extremity - Vascular US lower extremity ARTERIAL duplex (05/25/2017): Arterial occlusion in distal femoral artery with collateral flow noted; incidentally, there is acute nonocclusive DVT in left popliteal vein. - DVTs occurred during 3-week hospitalization for MRSA pneumonia - Patient was treated with Eliquis x 6 months, discontinued per vascular/vein specialist (Dr. Servando Snare) - Patient continues on aspirin 81 mg daily  per recommendation of  vascular surgeon   3.  Tobacco use -  Reports chronic cigarette smoking x 40 years, previously smoked 1 PPD to 4 PPD cigarettes, but has cut back to 0.5 PPD cigarettes for the past 6 months. - Need for LDCT scan has been addressed by his pulmonology office, last performed in March 2023 (lung RADS 1 negative)   4.  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  Lives at home with his wife and son. - TOBACCO: Current smoker, 0.5 PPD cigarettes - ALCOHOL: Drinks approximately 80 ounces of beer daily.  Previously drank 12+ beers daily. - DRUGS: Patient denies illicit drug use. - FAMILY: Patient's father had colorectal cancer.  No family history of myeloproliferative neoplasm.  PLAN SUMMARY: >> Phlebotomy every 6 weeks >> Labs (same day as phlebotomy) in 4 months = CBC/D, erythropoietin, carbon monoxide, carboxyhemoglobin >> PHONE visit in 4 months (1 week after labs)     REVIEW OF SYSTEMS:   Review of Systems  Constitutional:  Positive for fatigue. Negative for appetite change, chills, diaphoresis, fever and unexpected weight change.  HENT:   Positive for tinnitus. Negative for lump/mass and nosebleeds.   Eyes:  Positive for eye problems (intermittent blurry vision).  Respiratory:  Positive for cough and shortness of breath. Negative for hemoptysis.   Cardiovascular:  Positive for chest pain (occasional, none today). Negative for leg swelling and palpitations.  Gastrointestinal:  Negative for abdominal pain, blood in stool, constipation, diarrhea, nausea and vomiting.  Genitourinary:  Negative for hematuria.   Musculoskeletal:  Positive for arthralgias.  Skin: Negative.   Neurological:  Positive for dizziness, headaches and numbness. Negative for  light-headedness.  Hematological:  Does not bruise/bleed easily.  Psychiatric/Behavioral:  Positive for depression. The patient is nervous/anxious.      PHYSICAL EXAM:  ECOG PERFORMANCE STATUS: 1 - Symptomatic but completely ambulatory  There were no vitals filed for this visit. There were no vitals filed for this visit. Physical Exam Constitutional:      Appearance: Normal appearance. He is obese.  HENT:     Head: Normocephalic and atraumatic.     Mouth/Throat:     Mouth: Mucous membranes are moist.  Eyes:     Extraocular Movements: Extraocular movements intact.     Pupils: Pupils are equal, round, and reactive to light.  Cardiovascular:     Rate and Rhythm: Normal rate and regular rhythm.     Pulses: Normal pulses.     Heart sounds: Normal heart sounds.  Pulmonary:     Effort: Pulmonary effort is normal.     Breath sounds: Normal breath sounds.  Abdominal:     General: Bowel sounds are normal.     Palpations: Abdomen is soft.     Tenderness: There is no abdominal tenderness.  Musculoskeletal:        General: No swelling.     Right lower leg: No edema.     Left lower leg: No edema.  Lymphadenopathy:     Cervical: No cervical adenopathy.  Skin:    General: Skin is warm and dry.     Findings: Erythema (Dusky erythema of hands, feet, and ears) present.  Neurological:     General: No focal deficit present.     Mental Status: He is alert and oriented to person, place,  and time.  Psychiatric:        Mood and Affect: Mood normal.        Behavior: Behavior normal.     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   History of ankle surgery    History of brain tumor    Hyperlipidemia    Hypertension    Memory deficit    from brain surgery- benign tumor   Memory loss    On home oxygen therapy 08/26/2018   at night while lying down   Peripheral vision loss    Left due to brain surgery   Pneumonia    Polycythemia    Polycythemia, secondary    Seizures ([place]) 11/19/2013   none since 2016   Shortness of breath    Hx of smoking.   SVT (supraventricular tachycardia)    Wears dentures    Wears glasses    Past Surgical History:  Procedure Laterality Date   ABDOMINAL AORTOGRAM W/LOWER EXTREMITY N/A 05/30/2017   Procedure: ABDOMINAL AORTOGRAM W/LOWER EXTREMITY;  Surgeon: Elam Dutch, MD;  Location: [facility];  Service: Cardiovascular;  Laterality: N/A;   ABDOMINAL AORTOGRAM W/LOWER EXTREMITY N/A 07/12/2020   Procedure: ABDOMINAL AORTOGRAM W/LOWER EXTREMITY;  Surgeon: [name], MD;  Location: [facility];  Service: Cardiovascular;  Laterality: N/A;   ANKLE ARTHROSCOPY  01/17/2012   Procedure: ANKLE ARTHROSCOPY;  Surgeon: [name], MD;  Location: [facility];  Service: Orthopedics;  Laterality: Left;   ANKLE ARTHROSCOPY WITH FUSION Left 10/02/2016   Procedure: Left Posterior Subtalar Arthrodesis Arthroscopy;  Surgeon: [name], MD;  Location: [facility];  Service: Orthopedics;  Laterality: Left;   ANKLE FUSION Left 11/2012   Dr Leisa Lenz FUSION Right 08/09/2020   Procedure: RIGHT ANKLE FUSION;  Surgeon: [name], MD;  Location: [facility];  Service: Orthopedics;  Laterality: Right;   ANKLE SURGERY  2003   Left   BRAIN SURGERY  2009   Biopsy   COLONOSCOPY WITH PROPOFOL N/A 03/11/2019   Procedure: COLONOSCOPY WITH PROPOFOL;  Surgeon: Mauri Pole, MD;  Location: [facility];  Service: Endoscopy;  Laterality: N/A;   FRACTURE  SURGERY  2003   Left ankle   HARDWARE REMOVAL Left 12/08/2012   Procedure: HARDWARE REMOVAL;  Surgeon: [name], MD;  Location: [facility];  Service: Orthopedics;  Laterality: Left;  Removal Deep Hardware, Take Down Non-Union, Revision Internal Fixation Left Ankle   I & D EXTREMITY Left 01/01/2013   Procedure: IRRIGATION AND DEBRIDEMENT EXTREMITY;  Surgeon: [name], MD;  Location: [facility];  Service: Orthopedics;  Laterality: Left;  Irrigation, Debridement and Placement Antibiotic Beads Left Ankle   INNER EAR SURGERY     OPEN REDUCTION INTERNAL FIXATION (ORIF) SCAPHOID WITH DISTAL RADIUS GRAFT Right 02/01/2013   Procedure: OPEN REDUCTION INTERNAL FIXATION (ORIF) RIGHT SCAPHOID FRACTURE WITH DISTAL RADIUS GRAFT;  Surgeon: Sheral Apley  Burney Gauze, MD;  Location: [facility];  Service: Orthopedics;  Laterality: Right;   ORIF ANKLE FRACTURE Left 12/08/2012   Procedure: OPEN REDUCTION INTERNAL FIXATION (ORIF) ANKLE FRACTURE;  Surgeon: [name], MD;  Location: [facility];  Service: Orthopedics;  Laterality: Left;  Removal Deep Hardware, Take Down Non-Union, Revision Internal Fixation Left Ankle   ORIF ANKLE FRACTURE Right 09/03/2013   Procedure: OPEN REDUCTION INTERNAL FIXATION (ORIF) ANKLE FRACTURE;  Surgeon: [name], MD;  Location: [facility];  Service: Orthopedics;  Laterality: Right;  Open Reduction Internal Fixation Right Fibula   POLYPECTOMY  03/11/2019   Procedure: POLYPECTOMY;  Surgeon: Mauri Pole, MD;  Location: [facility];  Service: Endoscopy;;   SCAPHOID FRACTURE SURGERY  02/04/2013   SHOULDER ARTHROSCOPY  2008   Left   TYMPANOSTOMY TUBE PLACEMENT  1993    SOCIAL HISTORY:  Social History   Socioeconomic History   Marital status: Married    Spouse name: Vickie   Number of children: 1   Years of education: 12   Highest education level: High school graduate  Occupational History    Employer: NOT EMPLOYED    Comment: Disabled  Tobacco Use   Smoking status: Every Day    Packs/day:  0.50    Years: 40.00    Additional pack years: 0.00    Total pack years: 20.00    Types: Cigarettes   Smokeless tobacco: Never   Tobacco comments:    1 pack every 2 days    HEI. 09/03/2022  Vaping Use   Vaping Use: Never used  Substance and Sexual Activity   Alcohol use: No    Alcohol/week: 0.0 standard drinks of alcohol    Comment: quit Oct. 2017(heavy drinker) .In AA   Drug use: No   Sexual activity: Not Currently  Other Topics Concern   Not on file  Social History Narrative   Patient lives at home with his wife Harold Bailey)    Disabled   Left handed   Education 12 th    Caffeine Coffee four cups a day no tea no soda mostly water   Social Determinants of Health   Financial Resource Strain: Not on file  Food Insecurity: Not on file  Transportation Needs: Not on file  Physical Activity: Not on file  Stress: Not on file  Social Connections: Not on file  Intimate Partner Violence: Not on file    FAMILY HISTORY:  Family History  Problem Relation Age of Onset   Hypertension Mother    Bronchitis Mother    Cancer Maternal Aunt    Cancer Maternal Grandmother    Healthy Sister    Healthy Brother    Stroke Neg Hx    Diabetes Neg Hx     CURRENT MEDICATIONS:  Outpatient Encounter Medications as of 12/23/2022  Medication Sig Note   albuterol (PROVENTIL HFA;VENTOLIN HFA) 108 (90 Base) MCG/ACT inhaler Inhale 2 puffs into the lungs every 6 (six) hours as needed for wheezing or shortness of breath.    allopurinol (ZYLOPRIM) 300 MG tablet Take 300 mg by mouth daily.    aspirin EC 81 MG tablet Take 81 mg by mouth daily.    benzonatate (TESSALON) 200 MG capsule Take 1 capsule (200 mg total) by mouth 3 (three) times daily as needed for cough.    gabapentin (NEURONTIN) 300 MG capsule Take 300 mg by mouth daily.    hydrOXYzine (ATARAX) 50 MG tablet Take 50 mg by mouth 2 (two) times daily as needed.  levETIRAcetam (KEPPRA) 500 MG tablet TAKE 1 TABLET(500 MG) BY MOUTH TWICE DAILY     meclizine (ANTIVERT) 25 MG tablet Take 25 mg by mouth 3 (three) times daily as needed.    meloxicam (MOBIC) 7.5 MG tablet TAKE 1 TABLET(7.5 MG) BY MOUTH DAILY 08/03/2020: Taking nabumetone also. I asked him to check with his doctor.   metFORMIN (GLUCOPHAGE) 500 MG tablet Take 500 mg by mouth 2 (two) times daily.    metoprolol succinate (TOPROL-XL) 100 MG 24 hr tablet Take 100 mg by mouth daily. Take with or immediately following a meal.    Multiple Vitamins-Minerals (MULTIVITAMIN WITH MINERALS) tablet Take 2 tablets by mouth daily.     Naphazoline-Pheniramine (OPCON-A) 0.027-0.315 % SOLN Place 1 drop into both eyes every 8 (eight) hours as needed (red, irritated eyes).    neomycin-polymyxin-hydrocortisone (CORTISPORIN) 3.5-10000-1 OTIC suspension Place 2 drops into the left ear 2 (two) times daily as needed (ear pain).    Oxycodone HCl 10 MG TABS Take 10 mg by mouth 4 (four) times daily as needed.    oxyCODONE-acetaminophen (PERCOCET/ROXICET) 5-325 MG tablet Take 1 tablet by mouth every 4 (four) hours as needed.    OXYGEN Inhale into the lungs. At home oxygen    pantoprazole (PROTONIX) 40 MG tablet Take 40 mg by mouth daily.    polyethylene glycol (MIRALAX / GLYCOLAX) 17 g packet Take 17 g by mouth daily.    rosuvastatin (CRESTOR) 20 MG tablet Take 20 mg by mouth daily.    tamsulosin (FLOMAX) 0.4 MG CAPS capsule Take 0.4 mg by mouth daily.    topiramate (TOPAMAX) 25 MG tablet Take 25 mg by mouth 2 (two) times daily.    traZODone (DESYREL) 100 MG tablet Take 200 mg by mouth at bedtime.    TRELEGY ELLIPTA 100-62.5-25 MCG/INH AEPB INHALE 1 PUFF INTO THE LUNGS DAILY (Patient taking differently: Take 1 puff by mouth daily.)    VITAMIN D PO Take 1 capsule by mouth daily.    VOLTAREN 1 % GEL APPLY 2 TO 4 GRAMS EXTERNALLY TO THE AFFECTED AREA TWICE DAILY (Patient taking differently: Apply 4 g topically 4 (four) times daily as needed (pain).)    zolpidem (AMBIEN) 5 MG tablet Take 5 mg by mouth at bedtime  as needed.    No facility-administered encounter medications on file as of 12/23/2022.    ALLERGIES:  Allergies  Allergen Reactions   Hydrocodone Rash    Can take percocet   Lyrica [Pregabalin] Swelling    Swelling and itching in hands   Other     No narcotics due to history of opioid dependence    LABORATORY DATA:  I have reviewed the labs as listed.  CBC    Component Value Date/Time   WBC 8.3 09/24/2022 1417   RBC 5.54 09/24/2022 1417   HGB 18.7 (H) 09/24/2022 1417   HCT 55.5 (H) 09/24/2022 1417   PLT 177 09/24/2022 1417   MCV 100.2 (H) 09/24/2022 1417   MCH 33.8 09/24/2022 1417   MCHC 33.7 09/24/2022 1417   RDW 13.3 09/24/2022 1417   LYMPHSABS 2.1 09/24/2022 1417   MONOABS 0.8 09/24/2022 1417   EOSABS 0.2 09/24/2022 1417   BASOSABS 0.1 09/24/2022 1417      Latest Ref Rng & Units 01/29/2021    3:22 PM 08/09/2020    8:10 AM 07/31/2020    3:32 PM  CMP  Glucose 70 - 99 mg/dL 97  102  85   BUN 6 - 23 mg/dL 12  14  9   Creatinine 0.40 - 1.50 mg/dL 0.84  0.70  0.86   Sodium 135 - 145 mEq/L 134  140  138   Potassium 3.5 - 5.1 mEq/L 5.0  4.6  5.0   Chloride 96 - 112 mEq/L 97  99  99   CO2 19 - 32 mEq/L 32   34   Calcium 8.4 - 10.5 mg/dL 9.7   9.8   Total Protein 6.0 - 8.3 g/dL 7.4   6.7   Total Bilirubin 0.2 - 1.2 mg/dL 0.3   0.3   Alkaline Phos 39 - 117 U/L 65   58   AST 0 - 37 U/L 20   17   ALT 0 - 53 U/L 18   16     DIAGNOSTIC IMAGING:  I have independently reviewed the relevant imaging and discussed with the patient.   WRAP UP:  All questions were answered. The patient knows to call the clinic with any problems, questions or concerns.  Medical decision making: Moderate  Time spent on visit: I spent 20 minutes counseling the patient face to face. The total time spent in the appointment was 30 minutes and more than 50% was on counseling.  Harriett Rush, PA-C  12/23/2022 1:53 PM

## 2022-12-23 NOTE — Progress Notes (Signed)
Patient presents today for therapeutic phlebotomy.  Patient is in satisfactory condition with no complaints voiced.  Vital signs are stable.  Hemoglobin today is 17.2 and hematocrit is 53.2.  We will proceed with phlebotomy per provider orders.  Phlebotomy started at 1410 and ended at 1419.  500 mL of blood was removed from left AC.  Patient tolerated phlebotomy well and vital signs remained stable.  Patient refused to wait the recommended 30 minute post phlebotomy wait time.  Patient left ambulatory in stable condition.

## 2022-12-23 NOTE — Patient Instructions (Signed)
[facility] **   You were seen today by Tarri Abernethy PA-C for your elevated red blood cells/elevated hemoglobin.   Your elevated red blood cell/hemoglobin are most likely related to your COPD and tobacco/cigarette smoking. The BEST treatment for your elevated blood counts is to stop smoking.  This will help with some of your other symptoms as well. We will continue to take your blood ("therapeutic phlebotomy") every 6 weeks to help lower your blood count to a safer level. Continue to take aspirin 81 mg daily to decrease your risk of blood clot, heart attack, and stroke that can be related to elevated blood counts. Will see you for follow-up visit in about 4 months  ** Thank you for trusting me with your healthcare!  I strive to provide all of my patients with quality care at each visit.  If you receive a survey for this visit, I would be so grateful to you for taking the time to provide feedback.  Thank you in advance!  ~ Karalynn [facility]                   Dr. Derek Jack   &   Tarri Abernethy, PA-C   - - - - - - - - - - - - - - - - - -    Thank you for choosing [facility] at [facility] to provide your oncology and hematology care.  To afford each patient quality time with our provider, please arrive at least 15 minutes before your scheduled appointment time.   If you have a lab appointment with the [facility] please come in thru the Main Entrance and check in at the main information desk.  You need to re-schedule your appointment should you arrive 10 or more minutes late.  We strive to give you quality time with our providers, and arriving late affects you and other patients whose appointments are after yours.  Also, if you no show three or more times for appointments you may be dismissed from the clinic at the providers discretion.     Again, thank you for choosing [facility].  Our hope is that these requests will decrease the amount of time that you wait before being seen by our physicians.       _____________________________________________________________  Should you have questions after your visit to [facility], please contact our office at [phone] and follow the prompts.  Our office hours are 8:00 a.m. and 4:30 p.m. Monday - Friday.  Please note that voicemails left after 4:00 p.m. may not be returned until the following business day.  We are closed weekends and major holidays.  You do have access to a nurse 24-7, just call the main number to the clinic [phone] and do not press any options, hold on the line and a nurse will answer the phone.    For prescription refill requests, have your pharmacy contact our office and allow 72 hours.

## 2023-02-03 ENCOUNTER — Inpatient Hospital Stay: Payer: 59

## 2023-02-06 ENCOUNTER — Ambulatory Visit (INDEPENDENT_AMBULATORY_CARE_PROVIDER_SITE_OTHER): Payer: 59 | Admitting: Pulmonary Disease

## 2023-02-06 ENCOUNTER — Encounter: Payer: Self-pay | Admitting: Pulmonary Disease

## 2023-02-06 VITALS — BP 111/72 | HR 92 | Ht 72.0 in | Wt 212.0 lb

## 2023-02-06 DIAGNOSIS — J432 Centrilobular emphysema: Secondary | ICD-10-CM

## 2023-02-06 DIAGNOSIS — Z720 Tobacco use: Secondary | ICD-10-CM | POA: Diagnosis not present

## 2023-02-06 MED ORDER — TRELEGY ELLIPTA 100-62.5-25 MCG/ACT IN AEPB: 1.0000 | INHALATION_SPRAY | Freq: Every day | RESPIRATORY_TRACT | 1 refills | Status: AC

## 2023-02-06 NOTE — Progress Notes (Signed)
   Subjective:    Patient ID: Harold Bailey, male    DOB: 1965/12/02, 57 y.o.   MRN: [id number]  HPI  57 yo smoker for FU of COPD & chronic hypoxic resp failure.  04/2017 Hosp  for 2 months for chronic respiratory failure, MRSA pneumonia, C. difficile colitis and tracheostomy due to prolonged mechanical ventilation.  He was discharged on oxygen about 4 L at daytime and 2 L during sleep.     PMH -substance abuse,went through withdrawal of pain medications in 09/2017. On Xanax 1 mg thrice daily.  BIpolar &  seizure disorder on 2 anticonvulsants   He used to smoke about 2 to 4 packs/day  Chief Complaint  Patient presents with   Follow-up    Pt f/u states that he has had increased SOB for 2 days prior to appt. Needs something to help him break up the chest congestion    34-month follow-up visit He is still smoking about 3 to 4 packs/day. We reviewed low-dose CT chest, this was reassuring. He complains of increased shortness of breath last 2 days, cough productive of clear sputum.  Denies wheezing.  He is compliant with Trelegy  Significant tests/ events reviewed 05/2021 OV >> sat 88% at rest  11/2021 O2 88% RA, able to maintain O2 91% on 3L pulsed. >> POC Spirometry 09/2018 severe airway obstruction with ratio of 67, FEV1 of 39% FVC of 45%   LDCT chest 11/2022 RADS 1LDCT 11/2021 >> emphysema ,Fluid collection involving the anterior superior spleen measures 5.8 x 6.3 cm [place] 02/2022 >> no fluid  Review of Systems neg for any significant sore throat, dysphagia, itching, sneezing, nasal congestion or excess/ purulent secretions, fever, chills, sweats, unintended wt loss, pleuritic or exertional cp, hempoptysis, orthopnea pnd or change in chronic leg swelling. Also denies presyncope, palpitations, heartburn, abdominal pain, nausea, vomiting, diarrhea or change in bowel or urinary habits, dysuria,hematuria, rash, arthralgias, visual complaints, headache, numbness weakness or ataxia.      Objective:   Physical Exam  Gen. Pleasant, well-nourished, in no distress, normal affect ENT - no pallor,icterus, no post nasal drip Neck: No JVD, no thyromegaly, no carotid bruits Lungs: no use of accessory muscles, no dullness to percussion, clear without rales or rhonchi  Cardiovascular: Rhythm regular, heart sounds  normal, no murmurs or gallops, no peripheral edema Abdomen: soft and non-tender, no hepatosplenomegaly, BS normal. Musculoskeletal: No deformities, no cyanosis or clubbing Neuro:  alert, non focal       Assessment & Plan:

## 2023-02-06 NOTE — Assessment & Plan Note (Signed)
Continue Trelegy. Use albuterol for rescue. He will call for signs and symptoms of COPD exacerbation.  Will provide him an antibiotic if sputum changes color

## 2023-02-06 NOTE — Assessment & Plan Note (Signed)
I encouraged him to use nicotine patches to cut down.  If he fails nicotine patch we can consider Nicotrol inhaler. Smoking cessation was emphasized is the most important intervention that would add years to his life

## 2023-02-06 NOTE — Patient Instructions (Addendum)
Try to QUIT smoking ! Try nicotine patch 21 mg/ day If this does not work, we can try nicotrol inhaler  CT scan was ok  X refills on trelegy

## 2023-02-11 ENCOUNTER — Inpatient Hospital Stay: Payer: 59

## 2023-03-04 ENCOUNTER — Encounter: Payer: Self-pay | Admitting: Family

## 2023-03-04 ENCOUNTER — Ambulatory Visit: Payer: Self-pay

## 2023-03-04 ENCOUNTER — Ambulatory Visit (INDEPENDENT_AMBULATORY_CARE_PROVIDER_SITE_OTHER): Payer: 59 | Admitting: Family

## 2023-03-04 DIAGNOSIS — M1712 Unilateral primary osteoarthritis, left knee: Secondary | ICD-10-CM | POA: Diagnosis not present

## 2023-03-04 DIAGNOSIS — M5442 Lumbago with sciatica, left side: Secondary | ICD-10-CM | POA: Diagnosis not present

## 2023-03-04 DIAGNOSIS — G8929 Other chronic pain: Secondary | ICD-10-CM | POA: Diagnosis not present

## 2023-03-04 DIAGNOSIS — M25562 Pain in left knee: Secondary | ICD-10-CM

## 2023-03-04 MED ORDER — PREDNISONE 50 MG PO TABS: ORAL_TABLET | ORAL | 0 refills | Status: DC

## 2023-03-04 NOTE — Progress Notes (Signed)
Office Visit Note   Patient: Harold Bailey           Date of Birth: July 13, 1966           MRN: [id number] Visit Date: 03/04/2023              Requested by: Kara Pacer, NP [address] [place],  [place] [address] PCP: Kara Pacer, NP  Chief Complaint  Patient presents with   Left Knee - Pain   Left Ankle - Pain      HPI: The patient is an 57 year old gentleman who presents today complaining of left leg pain complaining of stiffness in the left knee and ankle.  Complains of locking of the left knee this often occurs while he is walking pain shoots up and down his leg.  Feels that the left lower extremity is heavy with ambulation having a hard time walking has to lift his leg manually the setting into the car has been reduced his activities for the last 9 days due to increase in pain and difficulty weightbearing he is using a straight cane today.  Also notes he would like to transfer his care to the [facility] office as this is closer to home  Assessment & Plan: Visit Diagnoses:  1. Low back pain with left-sided sciatica, unspecified back pain laterality, unspecified chronicity   2. Chronic pain of left knee     Plan: Will proceed with MRI lumbar spine.  Discussed that he could have MRI review and evaluation at [facility] will also refer him to physical therapy at [facility] to work on quad strengthening VMO.  As well as low back  Offered prednisone burst for his lumbar radiculopathy left-sided  Follow-Up Instructions: No follow-ups on file.   Left Knee Exam   Tenderness  The patient is experiencing tenderness in the medial joint line and lateral joint line.  Range of Motion  The patient has normal left knee ROM.  Other  Erythema: absent Swelling: none  Comments:  Quad atrophy, patellar instability,    Back Exam   Tenderness  The patient is experiencing no tenderness.   Tests  Straight leg  raise right: negative Straight leg raise left: positive  Other  Gait: abnormal      Patient is alert, oriented, no adenopathy, well-dressed, normal affect, normal respiratory effort.   Imaging: No results found. No images are attached to the encounter.  Labs: Lab Results  Component Value Date   HGBA1C 6.0 (H) 04/25/2020   HGBA1C 6.3 (H) 02/01/2020   HGBA1C 6.3 (H) 06/10/2017   LABURIC 5.2 04/25/2020   REPTSTATUS 02/23/2020 FINAL 02/21/2020   GRAMSTAIN  05/11/2017    ABUNDANT WBC PRESENT, PREDOMINANTLY PMN FEW GRAM NEGATIVE RODS    CULT  02/21/2020    NO GROWTH Performed at [facility], [address], [place], [place] [address]    LABORGA METHICILLIN RESISTANT STAPHYLOCOCCUS AUREUS 05/11/2017     Lab Results  Component Value Date   ALBUMIN 4.5 01/29/2021   ALBUMIN 4.2 07/31/2020   ALBUMIN 3.8 02/01/2020    Lab Results  Component Value Date   MG 2.0 04/25/2020   MG 2.0 05/28/2017   MG 2.3 05/23/2017   Lab Results  Component Value Date   VD25OH 34.03 04/25/2020   VD25OH 37.9 02/01/2020    No results found for: "PREALBUMIN"    Latest Ref Rng & Units 12/23/2022   12:22 PM 09/24/2022  2:17 PM 01/29/2021    3:22 PM  CBC EXTENDED  WBC 4.0 - 10.5 K/uL 9.1  8.3  8.3   RBC 4.22 - 5.81 MIL/uL 5.38  5.54  5.31   Hemoglobin 13.0 - 17.0 g/dL 16.1  09.6  04.5   HCT 39.0 - 52.0 % 53.2  55.5  52.7   Platelets 150 - 400 K/uL 231  177  200.0   NEUT# 1.7 - 7.7 K/uL  5.2    Lymph# 0.7 - 4.0 K/uL  2.1       There is no height or weight on file to calculate BMI.  Orders:  Orders Placed This Encounter  Procedures   XR Lumbar Spine 2-3 Views   XR Knee 1-2 Views Left   No orders of the defined types were placed in this encounter.    Procedures: No procedures performed  Clinical Data: No additional findings.  ROS:  All other systems negative, except as noted in the HPI. Review of Systems  Objective: Vital Signs: There were no vitals taken for  this visit.  Specialty Comments:  No specialty comments available.  PMFS History: Patient Active Problem List   Diagnosis Date Noted   Arthritis of right ankle 08/09/2020   Post-traumatic osteoarthritis, right ankle and foot    Positive colorectal cancer screening using Cologuard test    Otitis media, left 10/15/2018   On home oxygen therapy 08/26/2018   Difficult intravenous access    Chronic respiratory failure with hypoxia (HCC)    Essential hypertension 05/11/2017   Plantar fasciitis of left foot 01/14/2017   Traumatic arthritis of ankle, right 01/07/2017   H/O ankle fusion 11/14/2016   Osteoarthritis of subtalar joint, left 10/16/2016   At risk for adverse drug event 10/08/2016   History of snoring 10/08/2016   Subtalar joint instability, left    Fibrosis of subtalar joint, left 08/30/2016   History of alcohol abuse 07/02/2016   COPD (chronic obstructive pulmonary disease) (HCC) 07/02/2016   Tobacco dependence 06/06/2015   Medication overuse headache 02/28/2015   Localization-related symptomatic epilepsy and epileptic syndromes with complex partial seizures, not intractable, without status epilepticus (HCC) 02/28/2015   Cognitive impairment 02/28/2015   Tobacco abuse 02/28/2015   Brain tumor (HCC) 11/19/2013   Seizures (HCC) 11/19/2013   Memory loss    HA (headache)    Gait disturbance    Falls frequently 10/15/2013   Swelling 09/09/2013   Rash and nonspecific skin eruption 09/09/2013   Ankle fracture, lateral malleolus, closed 09/03/2013   Unspecified vitamin D deficiency 08/12/2013   Polycythemia, secondary    Memory deficit    History of drug abuse (HCC)    GERD (gastroesophageal reflux disease)    S/P tricuspid valve repair    Arthritis    Hyperlipidemia    Prediabetes    Past Medical History:  Diagnosis Date   Anxiety    Arthritis    Left ankle, hand   COPD (chronic obstructive pulmonary disease) (HCC)    COPD (chronic obstructive pulmonary disease)  (HCC)    Diabetes mellitus without complication (HCC)    Drug abuse (HCC)    DVT (deep venous thrombosis) (HCC)    Bilateral legs   Dysrhythmia    SVT   Gait disturbance    GERD (gastroesophageal reflux disease)    Headache(784.0) 2009   Initiated tx for loss of memory-Brain tumor; 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   History of ankle surgery    History of brain tumor  Hyperlipidemia    Hypertension    Memory deficit    from brain surgery- benign tumor   Memory loss    On home oxygen therapy 08/26/2018   at night while lying down   Peripheral vision loss    Left due to brain surgery   Pneumonia    Polycythemia    Polycythemia, secondary    Seizures (HCC) 11/19/2013   none since 2016   Shortness of breath    Hx of smoking.   SVT (supraventricular tachycardia)    Wears dentures    Wears glasses     Family History  Problem Relation Age of Onset   Hypertension Mother    Bronchitis Mother    Cancer Maternal Aunt    Cancer Maternal Grandmother    Healthy Sister    Healthy Brother    Stroke Neg Hx    Diabetes Neg Hx     Past Surgical History:  Procedure Laterality Date   ABDOMINAL AORTOGRAM W/LOWER EXTREMITY N/A 05/30/2017   Procedure: ABDOMINAL AORTOGRAM W/LOWER EXTREMITY;  Surgeon: Sherren Kerns, MD;  Location: [facility];  Service: Cardiovascular;  Laterality: N/A;   ABDOMINAL AORTOGRAM W/LOWER EXTREMITY N/A 07/12/2020   Procedure: ABDOMINAL AORTOGRAM W/LOWER EXTREMITY;  Surgeon: Cephus Shelling, MD;  Location: [facility];  Service: Cardiovascular;  Laterality: N/A;   ANKLE ARTHROSCOPY  01/17/2012   Procedure: ANKLE ARTHROSCOPY;  Surgeon: Nadara Mustard, MD;  Location: [facility] OR;  Service: Orthopedics;  Laterality: Left;   ANKLE ARTHROSCOPY WITH FUSION Left 10/02/2016   Procedure: Left Posterior Subtalar Arthrodesis Arthroscopy;  Surgeon: Nadara Mustard, MD;  Location: [facility] OR;  Service: Orthopedics;  Laterality: Left;   ANKLE FUSION Left 11/2012   Dr Worthy Keeler FUSION Right 08/09/2020   Procedure: RIGHT ANKLE FUSION;  Surgeon: Nadara Mustard, MD;  [facility];  Service: Orthopedics;  Laterality: Right;   ANKLE SURGERY  2003   Left   BRAIN SURGERY  2009   Biopsy   COLONOSCOPY WITH PROPOFOL N/A 03/11/2019   Procedure: COLONOSCOPY WITH PROPOFOL;  Surgeon: Napoleon Form, MD;  Location: [facility];  Service: Endoscopy;  Laterality: N/A;   FRACTURE SURGERY  2003   Left ankle   HARDWARE REMOVAL Left 12/08/2012   Procedure: HARDWARE REMOVAL;  Surgeon: Nadara Mustard, MD;  Location: [facility] OR;  Service: Orthopedics;  Laterality: Left;  Removal Deep Hardware, Take Down Non-Union, Revision Internal Fixation Left Ankle   I & D EXTREMITY Left 01/01/2013   Procedure: IRRIGATION AND DEBRIDEMENT EXTREMITY;  Surgeon: Nadara Mustard, MD;  Location: [facility] OR;  Service: Orthopedics;  Laterality: Left;  Irrigation, Debridement and Placement Antibiotic Beads Left Ankle   INNER EAR SURGERY     OPEN REDUCTION INTERNAL FIXATION (ORIF) SCAPHOID WITH DISTAL RADIUS GRAFT Right 02/01/2013   Procedure: OPEN REDUCTION INTERNAL FIXATION (ORIF) RIGHT SCAPHOID FRACTURE WITH DISTAL RADIUS GRAFT;  Surgeon: Marlowe Shores, MD;  Location: [facility] OR;  Service: Orthopedics;  Laterality: Right;   ORIF ANKLE FRACTURE Left 12/08/2012   Procedure: OPEN REDUCTION INTERNAL FIXATION (ORIF) ANKLE FRACTURE;  Surgeon: Nadara Mustard, MD;  Location: [facility];  Service: Orthopedics;  Laterality: Left;  Removal Deep Hardware, Take Down Non-Union, Revision Internal Fixation Left Ankle   ORIF ANKLE FRACTURE Right 09/03/2013   Procedure: OPEN REDUCTION INTERNAL FIXATION (ORIF) ANKLE FRACTURE;  Surgeon: Nadara Mustard, MD;  Location: [facility];  Service: Orthopedics;  Laterality: Right;  Open Reduction Internal Fixation  Right Fibula   POLYPECTOMY  03/11/2019   Procedure: POLYPECTOMY;  Surgeon: Napoleon Form, MD;  Location: [facility];  Service: Endoscopy;;   SCAPHOID FRACTURE SURGERY  02/04/2013    SHOULDER ARTHROSCOPY  2008   Left   TYMPANOSTOMY TUBE PLACEMENT  1993   Social History   Occupational History    Employer: NOT EMPLOYED    Comment: Disabled  Tobacco Use   Smoking status: Every Day    Packs/day: 0.50    Years: 40.00    Additional pack years: 0.00    Total pack years: 20.00    Types: Cigarettes   Smokeless tobacco: Never   Tobacco comments:    1 pack every 2 days    HEI. 09/03/2022  Vaping Use   Vaping Use: Never used  Substance and Sexual Activity   Alcohol use: No    Alcohol/week: 0.0 standard drinks of alcohol    Comment: quit Oct. 2017(heavy drinker) .In AA   Drug use: No   Sexual activity: Not Currently

## 2023-03-11 ENCOUNTER — Telehealth: Payer: Self-pay | Admitting: Family

## 2023-03-11 NOTE — Telephone Encounter (Signed)
Patient called asked if he can get 5 more days of Prednisone. Patient said the swelling went down some but he still have some swelling.  The number to contact patient is [phone]

## 2023-03-12 NOTE — Telephone Encounter (Signed)
The prednisone was for the lumbar radiculopathy. Will not refill for the swelling

## 2023-03-14 NOTE — Telephone Encounter (Signed)
Pt informed

## 2023-03-17 ENCOUNTER — Inpatient Hospital Stay: Payer: 59 | Attending: Hematology

## 2023-03-25 ENCOUNTER — Inpatient Hospital Stay: Payer: 59

## 2023-03-26 ENCOUNTER — Inpatient Hospital Stay: Payer: 59 | Attending: Hematology

## 2023-03-26 ENCOUNTER — Inpatient Hospital Stay: Payer: 59

## 2023-03-26 DIAGNOSIS — D751 Secondary polycythemia: Secondary | ICD-10-CM | POA: Diagnosis not present

## 2023-03-26 LAB — CBC
HCT: 53.6 % — ABNORMAL HIGH (ref 39.0–52.0)
Hemoglobin: 17 g/dL (ref 13.0–17.0)
MCH: 29.4 pg (ref 26.0–34.0)
MCHC: 31.7 g/dL (ref 30.0–36.0)
MCV: 92.7 fL (ref 80.0–100.0)
Platelets: 206 10*3/uL (ref 150–400)
RBC: 5.78 MIL/uL (ref 4.22–5.81)
RDW: 16.1 % — ABNORMAL HIGH (ref 11.5–15.5)
WBC: 7.1 10*3/uL (ref 4.0–10.5)
nRBC: 0 % (ref 0.0–0.2)

## 2023-03-26 NOTE — Progress Notes (Signed)
Patient to have CBC drawn before phlebotomy today. Patient has ride waiting for him in parking lot and states that he does not have time to wait for results and have phlebotomy  because of "ride situation." Plan made for patient to have CBC done today and have phlebotomy on Friday. Patient verbalized understanding and is agreeable with plan.

## 2023-03-28 ENCOUNTER — Inpatient Hospital Stay: Payer: 59

## 2023-03-28 DIAGNOSIS — D751 Secondary polycythemia: Secondary | ICD-10-CM | POA: Diagnosis not present

## 2023-03-28 NOTE — Progress Notes (Signed)
Leone Payor presents today for theraputic phlebotomy per MD orders. Last hgb/hct on  was .VSS prior to procedure. Pt reports eating before arrival. Procedure started at 1111 using patients left AC. 500 grams of blood removed. Procedure ended at 1117. Gauze and coban applied to [facility], site clean and dry. VSS upon completion of procedure. Pt denies dizziness, lightheadedness, or feeling faint. Patient refused to wait recommended wait time. Patient ware and verbalized understanding of dangers of leaving before wait time. Discharged in satisfactory condition with follow up instructions.

## 2023-03-28 NOTE — Patient Instructions (Signed)
[facility]  Discharge Instructions: Thank you for choosing [facility] to provide your oncology and hematology care.  If you have a lab appointment with the Cancer Center - please note that after April 8th, 2024, all labs will be drawn in the cancer center.  You do not have to check in or register with the main entrance as you have in the past but will complete your check-in in the cancer center.  Wear comfortable clothing and clothing appropriate for easy access to any Portacath or PICC line.   We strive to give you quality time with your provider. You may need to reschedule your appointment if you arrive late (15 or more minutes).  Arriving late affects you and other patients whose appointments are after yours.  Also, if you miss three or more appointments without notifying the office, you may be dismissed from the clinic at the provider's discretion.      For prescription refill requests, have your pharmacy contact our office and allow 72 hours for refills to be completed.    Today you received the following phlebotomy.  Therapeutic Phlebotomy Therapeutic phlebotomy is the planned removal of blood from a person's body for the purpose of treating a medical condition. The procedure is lot like donating blood. Usually, about a pint (470 mL, or 0.47 L) of blood is removed. The average adult has 9-12 pints (4.3-5.7 L) of blood in his or her body. Therapeutic phlebotomy may be used to treat the following medical conditions: Hemochromatosis. This is a condition in which the blood contains too much iron. Polycythemia vera. This is a condition in which the blood contains too many red blood cells. Porphyria cutanea tarda. This is a disease in which an important part of hemoglobin is not made properly. It results in the buildup of abnormal amounts of porphyrins in the body. Sickle cell disease. This is a condition in which the red blood cells form an abnormal crescent  shape rather than a round shape. Tell a health care provider about: Any allergies you have. All medicines you are taking, including vitamins, herbs, eye drops, creams, and over-the-counter medicines. Any bleeding problems you have. Any surgeries you have had. Any medical conditions you have. Whether you are pregnant or may be pregnant. What are the risks? Generally, this is a safe procedure. However, problems may occur, including: Nausea or light-headedness. Low blood pressure (hypotension). Soreness, bleeding, swelling, or bruising at the needle insertion site. Infection. What happens before the procedure? Ask your health care provider about: Changing or stopping your regular medicines. This is especially important if you are taking diabetes medicines or blood thinners. Taking medicines such as aspirin and ibuprofen. These medicines can thin your blood. Do not take these medicines unless your health care provider tells you to take them. Taking over-the-counter medicines, vitamins, herbs, and supplements. Wear clothing with sleeves that can be raised above the elbow. You may have a blood sample taken. Your blood pressure, pulse rate, and breathing rate will be measured. What happens during the procedure?  You may be given a medicine to numb the area (local anesthetic). A tourniquet will be placed on your arm. A needle will be put into one of your veins. Tubing and a collection bag will be attached to the needle. Blood will flow through the needle and tubing into the collection bag. The collection bag will be placed lower than your arm so gravity can help the blood flow into the bag. You  may be asked to open and close your hand slowly and continually during the entire collection. After the specified amount of blood has been removed from your body, the collection bag and tubing will be clamped. The needle will be removed from your vein. Pressure will be held on the needle site to stop  the bleeding. A bandage (dressing) will be placed over the needle insertion site. The procedure may vary among health care providers and hospitals. What happens after the procedure? Your blood pressure, pulse rate, and breathing rate will be measured after the procedure. You will be encouraged to drink fluids. You will be encouraged to eat a snack to prevent a low blood sugar level. Your recovery will be assessed and monitored. Return to your normal activities as told by your health care provider. Summary Therapeutic phlebotomy is the planned removal of blood from a person's body for the purpose of treating a medical condition. Therapeutic phlebotomy may be used to treat hemochromatosis, polycythemia vera, porphyria cutanea tarda, or sickle cell disease. In the procedure, a needle is inserted and about a pint (470 mL, or 0.47 L) of blood is removed. The average adult has 9-12 pints (4.3-5.7 L) of blood in the body. This is generally a safe procedure, but it can sometimes cause problems such as nausea, light-headedness, or low blood pressure (hypotension). This information is not intended to replace advice given to you by your health care provider. Make sure you discuss any questions you have with your health care provider. Document Revised: 03/07/2021 Document Reviewed: 03/07/2021 Elsevier Patient Education  2024 Elsevier Inc.      To help prevent nausea and vomiting after your treatment, we encourage you to take your nausea medication as directed.  BELOW ARE SYMPTOMS THAT SHOULD BE REPORTED IMMEDIATELY: *FEVER GREATER THAN 100.4 F (38 C) OR HIGHER *CHILLS OR SWEATING *NAUSEA AND VOMITING THAT IS NOT CONTROLLED WITH YOUR NAUSEA MEDICATION *UNUSUAL SHORTNESS OF BREATH *UNUSUAL BRUISING OR BLEEDING *URINARY PROBLEMS (pain or burning when urinating, or frequent urination) *BOWEL PROBLEMS (unusual diarrhea, constipation, pain near the anus) TENDERNESS IN MOUTH AND THROAT WITH OR WITHOUT  PRESENCE OF ULCERS (sore throat, sores in mouth, or a toothache) UNUSUAL RASH, SWELLING OR PAIN  UNUSUAL VAGINAL DISCHARGE OR ITCHING   Items with * indicate a potential emergency and should be followed up as soon as possible or go to the Emergency Department if any problems should occur.  Please show the CHEMOTHERAPY ALERT CARD or IMMUNOTHERAPY ALERT CARD at check-in to the Emergency Department and triage nurse.  Should you have questions after your visit or need to cancel or reschedule your appointment, please contact [facility] [phone]  and follow the prompts.  Office hours are 8:00 a.m. to 4:30 p.m. Monday - Friday. Please note that voicemails left after 4:00 p.m. may not be returned until the following business day.  We are closed weekends and major holidays. You have access to a nurse at all times for urgent questions. Please call the main number to the clinic [phone] and follow the prompts.  For any non-urgent questions, you may also contact your provider using MyChart. We now offer e-Visits for anyone 23 and older to request care online for non-urgent symptoms. For details visit [email or web address].   Also download the MyChart app! Go to the app store, search "MyChart", open the app, select [facility], and log in with your MyChart username and password.

## 2023-04-01 ENCOUNTER — Ambulatory Visit (HOSPITAL_COMMUNITY)
Admission: RE | Admit: 2023-04-01 | Discharge: 2023-04-01 | Disposition: A | Discharge status: Home / Self Care | Payer: 59 | Source: Ambulatory Visit | Attending: Family | Admitting: Family

## 2023-04-01 DIAGNOSIS — M5442 Lumbago with sciatica, left side: Secondary | ICD-10-CM | POA: Diagnosis present

## 2023-04-04 ENCOUNTER — Telehealth: Payer: Self-pay | Admitting: Orthopedic Surgery

## 2023-04-04 NOTE — Telephone Encounter (Signed)
Pt informed

## 2023-04-04 NOTE — Telephone Encounter (Signed)
Sure, when I see them

## 2023-04-04 NOTE — Telephone Encounter (Signed)
Patient called asked if he can get a call with the MRI results. The number to contact patient is [phone]

## 2023-04-04 NOTE — Telephone Encounter (Signed)
When the results come in from MRI (not ready yet) can we call him with the report?

## 2023-04-09 ENCOUNTER — Telehealth: Payer: Self-pay | Admitting: Orthopedic Surgery

## 2023-04-09 NOTE — Telephone Encounter (Signed)
I called pt last week to let him know that we can call once the MRI report comes in and read by radiologist. It hasn't been resulted yet.

## 2023-04-09 NOTE — Telephone Encounter (Signed)
Harold Bailey is calling in to get the results of his MRI.  He states that someone was supposed to call him last week and he did not receive a call.  His call back # is [phone].

## 2023-04-10 ENCOUNTER — Encounter (HOSPITAL_COMMUNITY): Payer: Self-pay | Admitting: Physical Therapy

## 2023-04-10 ENCOUNTER — Ambulatory Visit (HOSPITAL_COMMUNITY): Payer: 59 | Attending: Family | Admitting: Physical Therapy

## 2023-04-10 ENCOUNTER — Other Ambulatory Visit: Payer: Self-pay

## 2023-04-10 DIAGNOSIS — M25562 Pain in left knee: Secondary | ICD-10-CM | POA: Diagnosis present

## 2023-04-10 DIAGNOSIS — M5459 Other low back pain: Secondary | ICD-10-CM | POA: Insufficient documentation

## 2023-04-10 DIAGNOSIS — G8929 Other chronic pain: Secondary | ICD-10-CM | POA: Insufficient documentation

## 2023-04-10 DIAGNOSIS — M6281 Muscle weakness (generalized): Secondary | ICD-10-CM | POA: Insufficient documentation

## 2023-04-10 DIAGNOSIS — R29898 Other symptoms and signs involving the musculoskeletal system: Secondary | ICD-10-CM | POA: Insufficient documentation

## 2023-04-10 DIAGNOSIS — R2689 Other abnormalities of gait and mobility: Secondary | ICD-10-CM | POA: Diagnosis present

## 2023-04-10 NOTE — Therapy (Signed)
OUTPATIENT PHYSICAL THERAPY LOWER EXTREMITY EVALUATION   Patient Name: Harold Bailey MRN: [id number] DOB:December 10, 1965, 57 y.o., male Today's Date: 04/10/2023  END OF SESSION:  PT End of Session - 04/10/23 1120     Visit Number 1    Number of Visits 12    Date for PT Re-Evaluation 05/22/23    Authorization Type Primary: [employer] medicare Secondary: Medicaid [place]    Progress Note Due on Visit 10    PT Start Time 1121    PT Stop Time 1201    PT Time Calculation (min) 40 min    Activity Tolerance Patient tolerated treatment well    Behavior During Therapy WFL for tasks assessed/performed             Past Medical History:  Diagnosis Date   Anxiety    Arthritis    Left ankle, hand   COPD (chronic obstructive pulmonary disease) (HCC)    COPD (chronic obstructive pulmonary disease) (HCC)    Diabetes mellitus without complication (HCC)    Drug abuse (HCC)    DVT (deep venous thrombosis) (HCC)    Bilateral legs   Dysrhythmia    SVT   Gait disturbance    GERD (gastroesophageal reflux disease)    Headache(784.0) 2009   Initiated tx for loss of memory-Brain tumor; 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   SVT (supraventricular tachycardia)    Wears dentures    Wears glasses    Past Surgical History:  Procedure Laterality Date   ABDOMINAL AORTOGRAM W/LOWER EXTREMITY N/A 05/30/2017   Procedure: ABDOMINAL AORTOGRAM W/LOWER EXTREMITY;  Surgeon: Sherren Kerns, MD;  Location: [facility];  Service: Cardiovascular;  Laterality: N/A;   ABDOMINAL  AORTOGRAM W/LOWER EXTREMITY N/A 07/12/2020   Procedure: ABDOMINAL AORTOGRAM W/LOWER EXTREMITY;  Surgeon: Cephus Shelling, MD;  Location: [facility];  Service: Cardiovascular;  Laterality: N/A;   ANKLE ARTHROSCOPY  01/17/2012   Procedure: ANKLE ARTHROSCOPY;  Surgeon: Nadara Mustard, MD;  Location: [facility];  Service: Orthopedics;  Laterality: Left;   ANKLE ARTHROSCOPY WITH FUSION Left 10/02/2016   Procedure: Left Posterior Subtalar Arthrodesis Arthroscopy;  Surgeon: Nadara Mustard, MD;  Location: [facility];  Service: Orthopedics;  Laterality: Left;   ANKLE FUSION Left 11/2012   Dr Worthy Keeler FUSION Right 08/09/2020   Procedure: RIGHT ANKLE FUSION;  Surgeon: Nadara Mustard, MD;  Location: [facility] OR;  Service: Orthopedics;  Laterality: Right;   ANKLE SURGERY  2003   Left   BRAIN SURGERY  2009   Biopsy   COLONOSCOPY WITH PROPOFOL N/A 03/11/2019   Procedure: COLONOSCOPY WITH PROPOFOL;  Surgeon: Napoleon Form, MD;  Location: [facility];  Service: Endoscopy;  Laterality: N/A;   FRACTURE SURGERY  2003   Left ankle   HARDWARE REMOVAL Left 12/08/2012   Procedure: HARDWARE REMOVAL;  Surgeon: Nadara Mustard, MD;  Location: [facility] OR;  Service:  Orthopedics;  Laterality: Left;  Removal Deep Hardware, Take Down Non-Union, Revision Internal Fixation Left Ankle   I & D EXTREMITY Left 01/01/2013   Procedure: IRRIGATION AND DEBRIDEMENT EXTREMITY;  Surgeon: Nadara Mustard, MD;  Location: [facility];  Service: Orthopedics;  Laterality: Left;  Irrigation, Debridement and Placement Antibiotic Beads Left Ankle   INNER EAR SURGERY     OPEN REDUCTION INTERNAL FIXATION (ORIF) SCAPHOID WITH DISTAL RADIUS GRAFT Right 02/01/2013   Procedure: OPEN REDUCTION INTERNAL FIXATION (ORIF) RIGHT SCAPHOID FRACTURE WITH DISTAL RADIUS GRAFT;  Surgeon: Marlowe Shores, MD;  Location: [facility];  Service: Orthopedics;  Laterality: Right;   ORIF ANKLE FRACTURE Left 12/08/2012   Procedure: OPEN REDUCTION INTERNAL FIXATION (ORIF) ANKLE FRACTURE;   Surgeon: Nadara Mustard, MD;  Location: [facility];  Service: Orthopedics;  Laterality: Left;  Removal Deep Hardware, Take Down Non-Union, Revision Internal Fixation Left Ankle   ORIF ANKLE FRACTURE Right 09/03/2013   Procedure: OPEN REDUCTION INTERNAL FIXATION (ORIF) ANKLE FRACTURE;  Surgeon: Nadara Mustard, MD;  Location: [facility];  Service: Orthopedics;  Laterality: Right;  Open Reduction Internal Fixation Right Fibula   POLYPECTOMY  03/11/2019   Procedure: POLYPECTOMY;  Surgeon: Napoleon Form, MD;  Location: [facility];  Service: Endoscopy;;   SCAPHOID FRACTURE SURGERY  02/04/2013   SHOULDER ARTHROSCOPY  2008   Left   TYMPANOSTOMY TUBE PLACEMENT  1993   Patient Active Problem List   Diagnosis Date Noted   Arthritis of right ankle 08/09/2020   Post-traumatic osteoarthritis, right ankle and foot    Positive colorectal cancer screening using Cologuard test    Otitis media, left 10/15/2018   On home oxygen therapy 08/26/2018   Difficult intravenous access    Chronic respiratory failure with hypoxia (HCC)    Essential hypertension 05/11/2017   Plantar fasciitis of left foot 01/14/2017   Traumatic arthritis of ankle, right 01/07/2017   H/O ankle fusion 11/14/2016   Osteoarthritis of subtalar joint, left 10/16/2016   At risk for adverse drug event 10/08/2016   History of snoring 10/08/2016   Subtalar joint instability, left    Fibrosis of subtalar joint, left 08/30/2016   History of alcohol abuse 07/02/2016   COPD (chronic obstructive pulmonary disease) (HCC) 07/02/2016   Tobacco dependence 06/06/2015   Medication overuse headache 02/28/2015   Localization-related symptomatic epilepsy and epileptic syndromes with complex partial seizures, not intractable, without status epilepticus (HCC) 02/28/2015   Cognitive impairment 02/28/2015   Tobacco abuse 02/28/2015   Brain tumor (HCC) 11/19/2013   Seizures (HCC) 11/19/2013   Memory loss    HA (headache)    Gait disturbance    Falls  frequently 10/15/2013   Swelling 09/09/2013   Rash and nonspecific skin eruption 09/09/2013   Ankle fracture, lateral malleolus, closed 09/03/2013   Unspecified vitamin D deficiency 08/12/2013   Polycythemia, secondary    Memory deficit    History of drug abuse (HCC)    GERD (gastroesophageal reflux disease)    S/P tricuspid valve repair    Arthritis    Hyperlipidemia    Prediabetes     PCP: Vara Guardian NP  REFERRING PROVIDER: Adonis Huguenin, NP  REFERRING DIAG: [phone] (ICD-10-CM) - Chronic pain of left knee and LBP  THERAPY DIAG:  Left knee pain, unspecified chronicity  Other low back pain  Muscle weakness (generalized)  Other abnormalities of gait and mobility  Other symptoms and signs involving the musculoskeletal system  Rationale for Evaluation and Treatment: Rehabilitation  ONSET DATE: 3-4  years  SUBJECTIVE:   SUBJECTIVE STATEMENT: Patient states he had MRI on back and knee. Patient states they think something is wrong with his back. Patient states knee pain that began about 3-4 years ago. He is unable to walk like he used to and has both ankles fused. He has had numerous ortho surgeries over the years. Has equilibrium problems due to head injury.  PERTINENT HISTORY: COPD, DM, hx DVT, HTN, HLD, hx brain tumor, history of ankle surgery (fusion) bilaterally PAIN:  Are you having pain? Yes: NPRS scale: 7/10 Pain location: left ankle Pain description: heavy Aggravating factors: constant Relieving factors: none  PRECAUTIONS: Fall  WEIGHT BEARING RESTRICTIONS: No  FALLS:  Has patient fallen in last 6 months? Yes. Number of falls 2   OCCUPATION: Disabled  PLOF: Independent  PATIENT GOALS: get check over    OBJECTIVE:   DIAGNOSTIC FINDINGS: MRI lumbar spine 7/9/24IMPRESSION: 1. No acute findings or explanation for the patient's symptoms. 2. Mild disc bulging and facet hypertrophy as described. No significant spinal stenosis or nerve  root encroachment.  PATIENT SURVEYS: FOTO complete next session - unsure if one time visit or not  at beginning of session  COGNITION: Overall cognitive status: Within functional limits for tasks assessed     SENSATION:WFL   POSTURE: rounded shoulders and forward head  PALPATION: TTP grossly lumbar spine  LUMBAR AROM:    04/10/2023      Flexion  50% limited     Extension  25% limited     R ROT       L ROT       R SB  25% limited    L SB 25% limited     * Pain   (Blank rows = not tested)    LOWER EXTREMITY ROM:  Active ROM Right eval Left eval  Hip flexion    Hip extension    Hip abduction    Hip adduction    Hip internal rotation    Hip external rotation    Knee flexion  150  Knee extension  4 hyperextension  Ankle dorsiflexion    Ankle plantarflexion    Ankle inversion    Ankle eversion     (Blank rows = not tested)  LOWER EXTREMITY MMT:  MMT Right eval Left eval  Hip flexion 5 5  Hip extension    Hip abduction    Hip adduction    Hip internal rotation    Hip external rotation    Knee flexion 5 5  Knee extension 5 5  Ankle dorsiflexion    Ankle plantarflexion    Ankle inversion    Ankle eversion     (Blank rows = not tested)    FUNCTIONAL TESTS:   Stairs: 4 inch, able to ascend alternating but tends to utilize step too pattern due to L quad weakness and motor control deficits  GAIT: Distance walked: 100 feet Assistive device utilized: None Level of assistance: Complete Independence Comments: L knee hyperextension/Genu recurvatum with limited flexion/extension ROM   TODAY'S TREATMENT:  DATE:  04/10/23 Standing lumbar extension 1 x 10 LAQ 1x 10 with 5 second holds    PATIENT EDUCATION:  Education details: Patient educated on exam findings, POC, scope of PT, HEP, and knee mechanics. Person educated:  Patient Education method: Explanation, Demonstration, and Handouts Education comprehension: verbalized understanding, returned demonstration, verbal cues required, and tactile cues required  HOME EXERCISE PROGRAM: Access Code: [email or web address] URL: [email or web address] Date: 04/10/2023 Prepared by: Greig Castilla Tymeir Weathington  Exercises - Standing Lumbar Extension  - 5 x daily - 7 x weekly - 1-2 sets - 10 reps - Seated Long Arc Quad (Mirrored)  - 3 x daily - 7 x weekly - 1-2 sets - 10 reps - 5 second  hold  ASSESSMENT:  CLINICAL IMPRESSION: Patient a 57 y.o. y.o. male who was seen today for physical therapy evaluation and treatment for L knee pain and LBP. Patient presents with pain limited deficits in LE and Lumbar spine strength, ROM, endurance, activity tolerance, and functional mobility with ADL. Patient is having to modify and restrict ADL as indicated by outcome measure score as well as subjective information and objective measures which is affecting overall participation. Patient will benefit from skilled physical therapy in order to improve function and reduce impairment.  OBJECTIVE IMPAIRMENTS: decreased activity tolerance, decreased balance, decreased endurance, decreased mobility, difficulty walking, decreased ROM, decreased strength, increased muscle spasms, impaired flexibility, improper body mechanics, postural dysfunction, and pain.   ACTIVITY LIMITATIONS: carrying, lifting, bending, standing, squatting, stairs, transfers, locomotion level, and caring for others  PARTICIPATION LIMITATIONS: meal prep, cleaning, laundry, shopping, community activity, and yard work  PERSONAL FACTORS: Time since onset of injury/illness/exacerbation and 3+ comorbidities: COPD, DM, hx DVT, HTN, HLD, hx brain tumor, history of ankle surgery (fusion) bilaterally  are also affecting patient's functional outcome.   REHAB POTENTIAL: Good  CLINICAL DECISION MAKING: Stable/uncomplicated  EVALUATION  COMPLEXITY: Low   GOALS: Goals reviewed with patient? Yes  SHORT TERM GOALS: Target date: 05/01/2023    Patient will be independent with HEP in order to improve functional outcomes. Baseline: Goal status: INITIAL  2.  Patient will report at least 25% improvement in symptoms for improved quality of life. Baseline: Goal status: INITIAL   LONG TERM GOALS: Target date: 05/22/2023    Patient will report at least 75% improvement in symptoms for improved quality of life. Baseline:  Goal status: INITIAL  2.  Patient will improve FOTO score to predicted outcomes in order to indicate improved tolerance to activity. Baseline:  Goal status: INITIAL  3.  Patient will be able to navigate stairs with reciprocal pattern without compensation in order to demonstrate improved LE strength. Baseline: 4 inch, able to ascend alternating but tends to utilize step too pattern due to L quad weakness and motor control deficits Goal status: INITIAL  4.  Patient will be able to ambulate at least 226 feet in with improved balance and reduce genu recurvatum in order to demonstrate improved tolerance to activity. Baseline:  Goal status: INITIAL     PLAN:  PT FREQUENCY: 1-2x/week  PT DURATION: 6 weeks  PLANNED INTERVENTIONS: Therapeutic exercises, Therapeutic activity, Neuromuscular re-education, Balance training, Gait training, Patient/Family education, Joint manipulation, Joint mobilization, Stair training, Orthotic/Fit training, DME instructions, Aquatic Therapy, Dry Needling, Electrical stimulation, Spinal manipulation, Spinal mobilization, Cryotherapy, Moist heat, Compression bandaging, scar mobilization, Splintting, Taping, Traction, Ultrasound, Ionotophoresis 4mg /ml Dexamethasone, and Manual therapy  PLAN FOR NEXT SESSION: complete foto, f/u with HEP, test glutes, L quad strength and motor control, core and likely glute  strength, functional strength, emphasis on limiting hyperextension,  balance training   Wyman Songster, PT 04/10/2023, 12:11 PM

## 2023-04-16 ENCOUNTER — Other Ambulatory Visit: Payer: Self-pay | Admitting: Family

## 2023-04-16 DIAGNOSIS — M5442 Lumbago with sciatica, left side: Secondary | ICD-10-CM

## 2023-04-22 ENCOUNTER — Ambulatory Visit (HOSPITAL_COMMUNITY): Payer: 59

## 2023-04-25 ENCOUNTER — Other Ambulatory Visit: Payer: Self-pay

## 2023-04-25 DIAGNOSIS — D751 Secondary polycythemia: Secondary | ICD-10-CM

## 2023-04-28 ENCOUNTER — Inpatient Hospital Stay: Payer: 59

## 2023-04-28 ENCOUNTER — Inpatient Hospital Stay: Payer: 59 | Attending: Hematology

## 2023-04-28 DIAGNOSIS — E1140 Type 2 diabetes mellitus with diabetic neuropathy, unspecified: Secondary | ICD-10-CM | POA: Diagnosis not present

## 2023-04-28 DIAGNOSIS — Z86718 Personal history of other venous thrombosis and embolism: Secondary | ICD-10-CM | POA: Diagnosis not present

## 2023-04-28 DIAGNOSIS — K219 Gastro-esophageal reflux disease without esophagitis: Secondary | ICD-10-CM | POA: Diagnosis not present

## 2023-04-28 DIAGNOSIS — Z800 Family history of malignant neoplasm of digestive organs: Secondary | ICD-10-CM | POA: Insufficient documentation

## 2023-04-28 DIAGNOSIS — E1159 Type 2 diabetes mellitus with other circulatory complications: Secondary | ICD-10-CM | POA: Diagnosis not present

## 2023-04-28 DIAGNOSIS — Z9981 Dependence on supplemental oxygen: Secondary | ICD-10-CM | POA: Insufficient documentation

## 2023-04-28 DIAGNOSIS — Z79899 Other long term (current) drug therapy: Secondary | ICD-10-CM | POA: Diagnosis not present

## 2023-04-28 DIAGNOSIS — D751 Secondary polycythemia: Secondary | ICD-10-CM | POA: Diagnosis present

## 2023-04-28 DIAGNOSIS — I10 Essential (primary) hypertension: Secondary | ICD-10-CM | POA: Insufficient documentation

## 2023-04-28 DIAGNOSIS — F17210 Nicotine dependence, cigarettes, uncomplicated: Secondary | ICD-10-CM | POA: Insufficient documentation

## 2023-04-28 DIAGNOSIS — J449 Chronic obstructive pulmonary disease, unspecified: Secondary | ICD-10-CM | POA: Insufficient documentation

## 2023-04-28 LAB — CARBOXYHEMOGLOBIN - COOX: Carboxyhemoglobin: 7.9 % (ref 0.5–1.5)

## 2023-04-28 LAB — CBC WITH DIFFERENTIAL/PLATELET
Abs Immature Granulocytes: 0.03 10*3/uL (ref 0.00–0.07)
Basophils Absolute: 0 10*3/uL (ref 0.0–0.1)
Basophils Relative: 0 %
Eosinophils Absolute: 0.3 10*3/uL (ref 0.0–0.5)
Eosinophils Relative: 3 %
HCT: 52.5 % — ABNORMAL HIGH (ref 39.0–52.0)
Hemoglobin: 16.5 g/dL (ref 13.0–17.0)
Immature Granulocytes: 0 %
Lymphocytes Relative: 26 %
Lymphs Abs: 2.4 10*3/uL (ref 0.7–4.0)
MCH: 28.9 pg (ref 26.0–34.0)
MCHC: 31.4 g/dL (ref 30.0–36.0)
MCV: 92.1 fL (ref 80.0–100.0)
Monocytes Absolute: 0.6 10*3/uL (ref 0.1–1.0)
Monocytes Relative: 7 %
Neutro Abs: 5.9 10*3/uL (ref 1.7–7.7)
Neutrophils Relative %: 64 %
Platelets: 213 10*3/uL (ref 150–400)
RBC: 5.7 MIL/uL (ref 4.22–5.81)
RDW: 16.1 % — ABNORMAL HIGH (ref 11.5–15.5)
WBC: 9.3 10*3/uL (ref 4.0–10.5)
nRBC: 0 % (ref 0.0–0.2)

## 2023-04-28 NOTE — Progress Notes (Signed)
Patients HCT 52.5, PA notified.  Patient asymptomatic.  No Phlebotomy needed per PA.

## 2023-04-30 ENCOUNTER — Encounter (HOSPITAL_COMMUNITY): Payer: 59 | Admitting: Physical Therapy

## 2023-05-04 NOTE — Progress Notes (Unsigned)
[facility] [address] [place], [place] [address]   CLINIC:  Medical Oncology/Hematology  PCP:  Kara Pacer, NP [address] [place] [address] [phone]   REASON FOR VISIT:  Follow-up for secondary polycythemia  CURRENT THERAPY: Intermittent phlebotomy + aspirin 81 mg daily  INTERVAL HISTORY:   Mr. Carroway 57 y.o. male returns for routine follow-up of secondary polycythemia.  He was last seen by Rojelio Brenner PA-C on 12/23/2022.  Since his last visit, he has been scheduled for as needed phlebotomy every 6 weeks, with phlebotomies performed on 12/23/2022 and 03/28/2023.    At today's visit, he reports feeling fair. *** He is tolerating phlebotomies well, with improved energy and decreased headaches following phlebotomies. *** He continues to have daily headaches (follows with neurology), but reports that these are slightly improved after phlebotomy. *** He continues to have unchanged tinnitus, dizziness, intermittent blurry vision, and nocturnal neuropathy. *** He has occasional reddish discoloration of his feet, but this is improved from before. *** He denies any aquagenic pruritus or B symptoms. *** Compliance with nighttime oxygen *** ***He reports smoking 1 PPD cigarettes.   He has 50***% energy and 100***% appetite. He endorses that he is maintaining a stable weight.  ASSESSMENT & PLAN:  1.  Erythrocytosis - Patient seen at the request of his PCP (FNP Marylynn Pearson of the [facility]) for workup and management of suspected secondary polycythemia. - Intermittent 20 elevated hemoglobin/hematocrit since at least 2013, with maximum Hgb 21.8/HCT 64.0 on 07/12/2020. - Current everyday cigarette smoker (0.5-1.0 PPD), previously smoked up to 4 PPD*** - Reports snoring and daytime hypersomnolence.  Referred to sleep medicine by PCP (scheduled for February)*** - No diuretics or testosterone supplements.*** - PMH significant  for COPD.  Sleep study in February 2024 did not show any significant OSA, but showed severe oxygen desaturation, requiring 2 L supplemental oxygen during sleep.  *** - Denies any obvious carbon monoxide exposure, but does use woodstove at home (split-level house); reports presence of carbon monoxide monitor at home. - Denies any family history of polycythemia or myeloproliferative neoplasm. 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  ***Continues to have daily headaches, tinnitus, dizziness, blurry vision, reddish discoloration of his feet.  No aquagenic pruritus or B symptoms. - Most recent labs (04/28/2023): Hgb 16.5/hematocrit 52.5%, elevated carboxyhemoglobin 7.9%, elevated carbon monoxide 10.3%, erythropoietin 21.3. - DIFFERENTIAL DIAGNOSIS: Erythrocytosis most likely related to cigarette smoking and COPD. - PLAN: Patient will likely continue to benefit from therapeutic phlebotomy to decrease symptom burden and decrease risk of thrombotic events by keeping HCT <52-54.0. -- CBC + phlebotomy every 2 months *** - Labs (same day as phlebotomy) in 4 months with PHONE visit the week after*** -- Continue aspirin 81 mg daily - Continued to encourage smoking cessation.  Continue nocturnal oxygen per pulmonologist.  ***   2.  History of DVT, provoked*** - Vascular US upper extremity (05/22/2017): Findings consistent with subacute deep vein thrombosis involving the subclavian, axillary, and brachial veins of left upper extremity - Vascular US lower extremity ARTERIAL duplex (05/25/2017): Arterial  occlusion in distal femoral artery  with collateral flow noted; incidentally, there is acute nonocclusive DVT in left popliteal vein. - DVTs occurred during 3-week hospitalization for MRSA pneumonia - Patient was treated with Eliquis x 6 months, discontinued per vascular/vein specialist (Dr. Lemar Livings) - Patient continues on aspirin 81 mg daily per recommendation of vascular surgeon   3.  Tobacco use*** -  Reports chronic cigarette smoking x 40 years, previously smoked 1 PPD to 4 PPD cigarettes, but has cut back to 0.5 PPD cigarettes for the past 6 months. - Need for LDCT scan has been addressed by his pulmonology office, last performed in March 2023 (lung RADS 1 negative)   4.  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  Lives at home with his wife and son. - TOBACCO: Current smoker, 0.5 PPD cigarettes - ALCOHOL: Drinks approximately 80 ounces of beer daily.  Previously drank 12+ beers daily. - DRUGS: Patient denies illicit drug use. - FAMILY: Patient's father had colorectal cancer.  No family history of myeloproliferative neoplasm.  PLAN SUMMARY: >> Phlebotomy every 2 months >> Labs (same day as phlebotomy) in 6 months = *** >> PHONE visit in 6 months (1 week after labs)***     REVIEW OF SYSTEMS: ***  Review of Systems  Constitutional:  Positive for fatigue. Negative for appetite change, chills, diaphoresis, fever and unexpected weight change.  HENT:   Positive for tinnitus. Negative for lump/mass and nosebleeds.   Eyes:  Positive for eye problems (intermittent blurry vision).  Respiratory:  Positive for cough and shortness of breath. Negative for hemoptysis.   Cardiovascular:  Positive for chest pain (occasional, none today). Negative for leg swelling  and palpitations.  Gastrointestinal:  Negative for abdominal pain, blood in stool, constipation, diarrhea, nausea and vomiting.  Genitourinary:  Negative for hematuria.   Musculoskeletal:  Positive for arthralgias.  Skin: Negative.   Neurological:  Positive for dizziness, headaches and numbness. Negative for light-headedness.  Hematological:  Does not bruise/bleed easily.  Psychiatric/Behavioral:  Positive for depression. The patient is nervous/anxious.      PHYSICAL EXAM:  ECOG PERFORMANCE STATUS: 1 - Symptomatic but completely ambulatory *** Refresh *** There were no vitals filed for this visit. There were no vitals filed for this visit. Physical Exam Constitutional:      Appearance: Normal appearance. He is obese.  HENT:     Head: Normocephalic and atraumatic.     Mouth/Throat:     Mouth: Mucous membranes are moist.  Eyes:     Extraocular Movements: Extraocular movements intact.     Pupils: Pupils are equal, round, and reactive to light.  Cardiovascular:     Rate and Rhythm: Normal rate and regular rhythm.     Pulses: Normal pulses.     Heart sounds: Normal heart sounds.  Pulmonary:     Effort: Pulmonary effort is normal.     Breath sounds: Normal breath sounds.  Abdominal:     General: Bowel sounds are normal.     Palpations: Abdomen is soft.     Tenderness: There is no abdominal tenderness.  Musculoskeletal:        General: No swelling.     Right lower leg: No edema.     Left lower leg: No edema.  Lymphadenopathy:     Cervical: No cervical adenopathy.  Skin:    General:  Skin is warm and dry.     Findings: Erythema (Dusky erythema of hands, feet, and ears) present.  Neurological:     General: No focal deficit present.     Mental Status: He is alert and oriented to person, place, and time.  Psychiatric:        Mood and Affect: Mood normal.        Behavior: Behavior normal.     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   SVT (supraventricular tachycardia)    Wears dentures    Wears glasses    Past Surgical History:  Procedure Laterality Date   ABDOMINAL AORTOGRAM W/LOWER EXTREMITY N/A 05/30/2017   Procedure: ABDOMINAL AORTOGRAM W/LOWER EXTREMITY;  Surgeon: Sherren Kerns, MD;  Location: [facility];  Service: Cardiovascular;  Laterality: N/A;   ABDOMINAL AORTOGRAM W/LOWER EXTREMITY N/A 07/12/2020   Procedure: ABDOMINAL AORTOGRAM W/LOWER EXTREMITY;  Surgeon: Cephus Shelling, MD;  Location: [facility];  Service: Cardiovascular;  Laterality: N/A;   ANKLE ARTHROSCOPY  01/17/2012   Procedure: ANKLE ARTHROSCOPY;  Surgeon: Nadara Mustard, MD;  Location: [facility] OR;  Service: Orthopedics;  Laterality: Left;   ANKLE ARTHROSCOPY WITH FUSION Left 10/02/2016   Procedure: Left Posterior Subtalar Arthrodesis Arthroscopy;  Surgeon: Nadara Mustard, MD;  Location: [facility] OR;  Service: Orthopedics;  Laterality: Left;   ANKLE FUSION Left 11/2012   Dr Worthy Keeler FUSION Right 08/09/2020   Procedure: RIGHT ANKLE FUSION;  Surgeon: Nadara Mustard, MD;   [facility];  Service: Orthopedics;  Laterality: Right;   ANKLE SURGERY  2003   Left   BRAIN SURGERY  2009   Biopsy   COLONOSCOPY WITH PROPOFOL N/A 03/11/2019   Procedure: COLONOSCOPY WITH PROPOFOL;  Surgeon: Napoleon Form, MD;  Location: [facility];  Service: Endoscopy;  Laterality: N/A;   FRACTURE SURGERY  2003   Left ankle   HARDWARE REMOVAL Left 12/08/2012   Procedure: HARDWARE REMOVAL;  Surgeon: Nadara Mustard, MD;  Location: [facility] OR;  Service: Orthopedics;  Laterality: Left;  Removal Deep Hardware, Take Down Non-Union, Revision Internal Fixation Left Ankle   I & D EXTREMITY Left 01/01/2013   Procedure: IRRIGATION AND DEBRIDEMENT EXTREMITY;  Surgeon: Nadara Mustard, MD;  Location: [facility] OR;  Service: Orthopedics;  Laterality: Left;  Irrigation, Debridement and Placement  Antibiotic Beads Left Ankle   INNER EAR SURGERY     OPEN REDUCTION INTERNAL FIXATION (ORIF) SCAPHOID WITH DISTAL RADIUS GRAFT Right 02/01/2013   Procedure: OPEN REDUCTION INTERNAL FIXATION (ORIF) RIGHT SCAPHOID FRACTURE WITH DISTAL RADIUS GRAFT;  Surgeon: Marlowe Shores, MD;  Location: [facility] OR;  Service: Orthopedics;  Laterality: Right;   ORIF ANKLE FRACTURE Left 12/08/2012   Procedure: OPEN REDUCTION INTERNAL FIXATION (ORIF) ANKLE FRACTURE;  Surgeon: Nadara Mustard, MD;  Location: [facility] OR;  Service: Orthopedics;  Laterality: Left;  Removal Deep Hardware, Take Down Non-Union, Revision Internal Fixation Left Ankle   ORIF ANKLE FRACTURE Right 09/03/2013   Procedure: OPEN REDUCTION INTERNAL FIXATION (ORIF) ANKLE FRACTURE;  Surgeon: Nadara Mustard, MD;  Location: [facility];  Service: Orthopedics;  Laterality: Right;  Open Reduction Internal Fixation Right Fibula   POLYPECTOMY  03/11/2019   Procedure: POLYPECTOMY;  Surgeon: Napoleon Form, MD;  Location: [facility];  Service: Endoscopy;;   SCAPHOID FRACTURE SURGERY  02/04/2013   SHOULDER ARTHROSCOPY  2008   Left   TYMPANOSTOMY TUBE PLACEMENT  1993    SOCIAL HISTORY:   Social History   Socioeconomic History   Marital status: Married    Spouse name: Vickie   Number of children: 1   Years of education: 12   Highest education level: High school graduate  Occupational History    Employer: NOT EMPLOYED    Comment: Disabled  Tobacco Use   Smoking status: Every Day    Current packs/day: 0.50    Average packs/day: 0.5 packs/day for 40.0 years (20.0 ttl pk-yrs)    Types: Cigarettes   Smokeless tobacco: Never   Tobacco comments:    1 pack every 2 days    HEI. 09/03/2022  Vaping Use   Vaping status: Never Used  Substance and Sexual Activity   Alcohol use: No    Alcohol/week: 0.0 standard drinks of alcohol    Comment: quit Oct. 2017(heavy drinker) .In AA   Drug use: No   Sexual activity: Not Currently  Other Topics Concern   Not on file  Social History Narrative   Patient lives at home with his wife Thornton Papas)    Disabled   Left handed   Education 12 th    Caffeine Coffee four cups a day no tea no soda mostly water   Social Determinants of Health   Financial Resource Strain: Not on file  Food Insecurity: Not on file  Transportation Needs: Not on file  Physical Activity: Not on file  Stress: Not on file  Social Connections: Not on file  Intimate Partner Violence: Not on file    FAMILY HISTORY:  Family History  Problem Relation Age of Onset   Hypertension Mother    Bronchitis Mother    Cancer Maternal Aunt    Cancer Maternal Grandmother    Healthy Sister    Healthy Brother    Stroke Neg Hx    Diabetes Neg Hx     CURRENT MEDICATIONS:  Outpatient Encounter Medications as of 05/05/2023  Medication Sig Note   albuterol (PROVENTIL HFA;VENTOLIN HFA) 108 (90 Base) MCG/ACT inhaler Inhale 2 puffs into the lungs every 6 (six) hours as needed for wheezing or shortness of breath.    allopurinol (ZYLOPRIM) 300 MG tablet Take 300 mg by mouth daily.    aspirin EC 81 MG tablet Take 81 mg by mouth daily.    benzonatate (TESSALON) 200 MG capsule  Take 1 capsule (200 mg total) by mouth 3 (three) times  daily as needed for cough.    Fluticasone-Umeclidin-Vilant (TRELEGY ELLIPTA) 100-62.5-25 MCG/ACT AEPB Inhale 1 puff into the lungs daily.    gabapentin (NEURONTIN) 300 MG capsule Take 300 mg by mouth daily.    hydrOXYzine (ATARAX) 50 MG tablet Take 50 mg by mouth 2 (two) times daily as needed.    levETIRAcetam (KEPPRA) 500 MG tablet TAKE 1 TABLET(500 MG) BY MOUTH TWICE DAILY    meclizine (ANTIVERT) 25 MG tablet Take 25 mg by mouth 3 (three) times daily as needed.    meloxicam (MOBIC) 7.5 MG tablet TAKE 1 TABLET(7.5 MG) BY MOUTH DAILY 08/03/2020: Taking nabumetone also. I asked him to check with his doctor.   metFORMIN (GLUCOPHAGE) 500 MG tablet Take 500 mg by mouth 2 (two) times daily.    metoprolol succinate (TOPROL-XL) 100 MG 24 hr tablet Take 100 mg by mouth daily. Take with or immediately following a meal.    Multiple Vitamins-Minerals (MULTIVITAMIN WITH MINERALS) tablet Take 2 tablets by mouth daily.     Naphazoline-Pheniramine (OPCON-A) 0.027-0.315 % SOLN Place 1 drop into both eyes every 8 (eight) hours as needed (red, irritated eyes).    neomycin-polymyxin-hydrocortisone (CORTISPORIN) 3.5-10000-1 OTIC suspension Place 2 drops into the left ear 2 (two) times daily as needed (ear pain).    Oxycodone HCl 10 MG TABS Take 10 mg by mouth 4 (four) times daily as needed.    oxyCODONE-acetaminophen (PERCOCET/ROXICET) 5-325 MG tablet Take 1 tablet by mouth every 4 (four) hours as needed.    OXYGEN Inhale into the lungs. At home oxygen    pantoprazole (PROTONIX) 40 MG tablet Take 40 mg by mouth daily.    polyethylene glycol (MIRALAX / GLYCOLAX) 17 g packet Take 17 g by mouth daily.    predniSONE (DELTASONE) 50 MG tablet Take one tablet by mouth once daily for 5 days.    rosuvastatin (CRESTOR) 20 MG tablet Take 20 mg by mouth daily.    tamsulosin (FLOMAX) 0.4 MG CAPS capsule Take 0.4 mg by mouth daily.    topiramate (TOPAMAX) 25 MG tablet Take 25  mg by mouth 2 (two) times daily.    traZODone (DESYREL) 100 MG tablet Take 200 mg by mouth at bedtime.    VITAMIN D PO Take 1 capsule by mouth daily.    VOLTAREN 1 % GEL APPLY 2 TO 4 GRAMS EXTERNALLY TO THE AFFECTED AREA TWICE DAILY (Patient taking differently: Apply 4 g topically 4 (four) times daily as needed (pain).)    zolpidem (AMBIEN) 5 MG tablet Take 5 mg by mouth at bedtime as needed.    No facility-administered encounter medications on file as of 05/05/2023.    ALLERGIES:  Allergies  Allergen Reactions   Hydrocodone Rash    Can take percocet   Lyrica [Pregabalin] Swelling    Swelling and itching in hands   Other     No narcotics due to history of opioid dependence    LABORATORY DATA:  I have reviewed the labs as listed.  CBC    Component Value Date/Time   WBC 9.3 04/28/2023 1414   RBC 5.70 04/28/2023 1414   HGB 16.5 04/28/2023 1414   HCT 52.5 (H) 04/28/2023 1414   PLT 213 04/28/2023 1414   MCV 92.1 04/28/2023 1414   MCH 28.9 04/28/2023 1414   MCHC 31.4 04/28/2023 1414   RDW 16.1 (H) 04/28/2023 1414   LYMPHSABS 2.4 04/28/2023 1414   MONOABS 0.6 04/28/2023 1414   EOSABS 0.3 04/28/2023 1414   BASOSABS 0.0 04/28/2023 1414  Latest Ref Rng & Units 01/29/2021    3:22 PM 08/09/2020    8:10 AM 07/31/2020    3:32 PM  CMP  Glucose 70 - 99 mg/dL 97  784  85   BUN 6 - 23 mg/dL 12  14  9    Creatinine 0.40 - 1.50 mg/dL 6.96  2.95  2.84   Sodium 135 - 145 mEq/L 134  140  138   Potassium 3.5 - 5.1 mEq/L 5.0  4.6  5.0   Chloride 96 - 112 mEq/L 97  99  99   CO2 19 - 32 mEq/L 32   34   Calcium 8.4 - 10.5 mg/dL 9.7   9.8   Total Protein 6.0 - 8.3 g/dL 7.4   6.7   Total Bilirubin 0.2 - 1.2 mg/dL 0.3   0.3   Alkaline Phos 39 - 117 U/L 65   58   AST 0 - 37 U/L 20   17   ALT 0 - 53 U/L 18   16     DIAGNOSTIC IMAGING:  I have independently reviewed the relevant imaging and discussed with the patient.   WRAP UP:  All questions were answered. The patient knows to call  the clinic with any problems, questions or concerns.  Medical decision making: Moderate  Time spent on visit: I spent 20 minutes counseling the patient face to face. The total time spent in the appointment was 30 minutes and more than 50% was on counseling.  Carnella Guadalajara, PA-C  ***

## 2023-05-05 ENCOUNTER — Inpatient Hospital Stay (HOSPITAL_BASED_OUTPATIENT_CLINIC_OR_DEPARTMENT_OTHER): Payer: 59 | Admitting: Physician Assistant

## 2023-05-05 VITALS — BP 131/78 | HR 84 | Temp 98.0°F | Resp 20 | Ht 72.0 in | Wt 201.7 lb

## 2023-05-05 DIAGNOSIS — D751 Secondary polycythemia: Secondary | ICD-10-CM

## 2023-05-05 NOTE — Patient Instructions (Signed)
[facility] at [facility] **VISIT SUMMARY & IMPORTANT INSTRUCTIONS **   You were seen today by Rojelio Brenner PA-C for your elevated red blood cells/elevated hemoglobin.   Your elevated red blood cell/hemoglobin are most likely related to your COPD and tobacco/cigarette smoking. The BEST treatment for your elevated blood counts is to stop smoking.  This will help with some of your other symptoms as well. We will continue to take your blood ("therapeutic phlebotomy") every 6 weeks to help lower your blood count to a safer level. Continue to take aspirin 81 mg daily to decrease your risk of blood clot, heart attack, and stroke that can be related to elevated blood counts. Will see you for follow-up visit in about 6 months  ** Thank you for trusting me with your healthcare!  I strive to provide all of my patients with quality care at each visit.  If you receive a survey for this visit, I would be so grateful to you for taking the time to provide feedback.  Thank you in advance!  ~ Yaneisy Wenz                   Dr. Doreatha Massed   &   Rojelio Brenner, PA-C   - - - - - - - - - - - - - - - - - -    Thank you for choosing [facility] at [facility] to provide your oncology and hematology care.  To afford each patient quality time with our provider, please arrive at least 15 minutes before your scheduled appointment time.   If you have a lab appointment with the Cancer Center please come in thru the Main Entrance and check in at the main information desk.  You need to re-schedule your appointment should you arrive 10 or more minutes late.  We strive to give you quality time with our providers, and arriving late affects you and other patients whose appointments are after yours.  Also, if you no show three or more times for appointments you may be dismissed from the clinic at the providers discretion.     Again, thank you for choosing [facility].  Our hope is that these requests will decrease the amount of time that you wait before being seen by our physicians.       _____________________________________________________________  Should you have questions after your visit to [facility], please contact our office at [phone] and follow the prompts.  Our office hours are 8:00 a.m. and 4:30 p.m. Monday - Friday.  Please note that voicemails left after 4:00 p.m. may not be returned until the following business day.  We are closed weekends and major holidays.  You do have access to a nurse 24-7, just call the main number to the clinic [phone] and do not press any options, hold on the line and a nurse will answer the phone.    For prescription refill requests, have your pharmacy contact our office and allow 72 hours.

## 2023-05-06 ENCOUNTER — Encounter (HOSPITAL_COMMUNITY): Payer: 59 | Admitting: Physical Therapy

## 2023-05-08 ENCOUNTER — Encounter (HOSPITAL_COMMUNITY): Payer: 59 | Admitting: Physical Therapy

## 2023-05-12 ENCOUNTER — Ambulatory Visit (HOSPITAL_COMMUNITY): Payer: 59 | Attending: Family | Admitting: Physical Therapy

## 2023-05-12 ENCOUNTER — Encounter: Payer: Self-pay | Admitting: Orthopedic Surgery

## 2023-05-12 ENCOUNTER — Ambulatory Visit (INDEPENDENT_AMBULATORY_CARE_PROVIDER_SITE_OTHER): Payer: 59 | Admitting: Orthopedic Surgery

## 2023-05-12 ENCOUNTER — Encounter (HOSPITAL_COMMUNITY): Payer: Self-pay | Admitting: Physical Therapy

## 2023-05-12 VITALS — BP 126/85 | HR 86 | Ht 72.0 in | Wt 202.0 lb

## 2023-05-12 DIAGNOSIS — Z981 Arthrodesis status: Secondary | ICD-10-CM | POA: Diagnosis not present

## 2023-05-12 DIAGNOSIS — R2689 Other abnormalities of gait and mobility: Secondary | ICD-10-CM | POA: Diagnosis present

## 2023-05-12 DIAGNOSIS — M5459 Other low back pain: Secondary | ICD-10-CM | POA: Diagnosis present

## 2023-05-12 DIAGNOSIS — M6281 Muscle weakness (generalized): Secondary | ICD-10-CM | POA: Insufficient documentation

## 2023-05-12 DIAGNOSIS — M25562 Pain in left knee: Secondary | ICD-10-CM | POA: Diagnosis present

## 2023-05-12 DIAGNOSIS — M21862 Other specified acquired deformities of left lower leg: Secondary | ICD-10-CM | POA: Diagnosis not present

## 2023-05-12 DIAGNOSIS — R29898 Other symptoms and signs involving the musculoskeletal system: Secondary | ICD-10-CM | POA: Diagnosis present

## 2023-05-12 NOTE — Progress Notes (Signed)
Chief Complaint  Patient presents with   Back Pain    REVIEW MRI lumbar/ per PA ordered in [place] office did not want to drive to [facility] states he has also had problems with ankle thought he was here for the ankle   Dorene Sorrow is here for?  He basically had bilateral ankle fusions.  On one of his follow-ups he was complaining of knee pain.  His workup seem to suggest lumbar pathology so he had an MRI.  The MRI was not yet reviewed so we will do that and address the hyperextending knee.   He does not complain of back hip or leg pain.  He does complain of pain around the ankle but it is not too significant.  The knee issue is more of hyperextension and "locking"  The report of the mri says   L3-4: Mild disc bulging eccentric to the left. Mild facet and ligamentous hypertrophy. No significant spinal stenosis or nerve root encroachment.   L4-5: Mild disc bulging eccentric to the left with a left foraminal annular rent. Mild facet and ligamentous hypertrophy. No significant spinal stenosis or nerve root encroachment.   L5-S1: Stable mild loss of disc height with mild disc bulging and a shallow central disc protrusion. No significant facet arthropathy, spinal stenosis or nerve root encroachment.   IMPRESSION: 1. No acute findings or explanation for the patient's symptoms. 2. Mild disc bulging and facet hypertrophy as described. No significant spinal stenosis or nerve root encroachment.     Electronically Signed   By: Carey Bullocks M.D.   On: 04/09/2023 17:07  So exam wise the knee definitely hyperextends and so does the right but not as much he is fused at both ankles.  His knee and hip range of motion remain normal.  There is no pain there was no instability  He had an x-ray at the other office it looked really good with no bone-on-bone changes no joint space narrowing no sclerosis no osteophytes  Encounter Diagnoses  Name Primary?   Hyperextension deformity of left knee Yes    S/P ankle fusion biltaeral     I told him to go ahead and go to therapy to try to address the hyperextension compensation at the knee for the ankle fusion.  1 month after therapy complete then recommend he come back if he still hyperextending and we will give him a brace with extension lock

## 2023-05-12 NOTE — Therapy (Signed)
OUTPATIENT PHYSICAL THERAPY TREATMENT   Patient Name: Harold Bailey MRN: [id number] DOB:[birth year], 57 y.o., male Today's Date: 05/12/2023  END OF SESSION:  PT End of Session - 05/12/23 1359     Visit Number 2    Number of Visits 12    Date for PT Re-Evaluation 05/22/23    Authorization Type Primary: [employer] medicare Secondary: Medicaid [place]    Progress Note Due on Visit 10    PT Start Time 1358   arrives late   PT Stop Time 1425    PT Time Calculation (min) 27 min    Activity Tolerance Patient tolerated treatment well    Behavior During Therapy WFL for tasks assessed/performed             Past Medical History:  Diagnosis Date   Anxiety    Arthritis    Left ankle, hand   COPD (chronic obstructive pulmonary disease) (HCC)    COPD (chronic obstructive pulmonary disease) (HCC)    Diabetes mellitus without complication (HCC)    Drug abuse (HCC)    DVT (deep venous thrombosis) (HCC)    Bilateral legs   Dysrhythmia    SVT   Gait disturbance    GERD (gastroesophageal reflux disease)    Headache(784.0) 2009   Initiated tx for loss of memory-Brain tumor; 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   SVT (supraventricular tachycardia)    Wears dentures    Wears glasses    Past Surgical History:  Procedure Laterality Date   ABDOMINAL AORTOGRAM W/LOWER EXTREMITY N/A 05/30/2017   Procedure: ABDOMINAL AORTOGRAM W/LOWER EXTREMITY;  Surgeon: Sherren Kerns, MD;  Location: [facility];  Service: Cardiovascular;  Laterality: N/A;   ABDOMINAL AORTOGRAM  W/LOWER EXTREMITY N/A 07/12/2020   Procedure: ABDOMINAL AORTOGRAM W/LOWER EXTREMITY;  Surgeon: Cephus Shelling, MD;  Location: [facility];  Service: Cardiovascular;  Laterality: N/A;   ANKLE ARTHROSCOPY  01/17/2012   Procedure: ANKLE ARTHROSCOPY;  Surgeon: Nadara Mustard, MD;  Location: [facility];  Service: Orthopedics;  Laterality: Left;   ANKLE ARTHROSCOPY WITH FUSION Left 10/02/2016   Procedure: Left Posterior Subtalar Arthrodesis Arthroscopy;  Surgeon: Nadara Mustard, MD;  Location: [facility];  Service: Orthopedics;  Laterality: Left;   ANKLE FUSION Left 11/2012   Dr Worthy Keeler FUSION Right 08/09/2020   Procedure: RIGHT ANKLE FUSION;  Surgeon: Nadara Mustard, MD;  Location: [facility] OR;  Service: Orthopedics;  Laterality: Right;   ANKLE SURGERY  2003   Left   BRAIN SURGERY  2009   Biopsy   COLONOSCOPY WITH PROPOFOL N/A 03/11/2019   Procedure: COLONOSCOPY WITH PROPOFOL;  Surgeon: Napoleon Form, MD;  Location: [facility];  Service: Endoscopy;  Laterality: N/A;   FRACTURE SURGERY  2003   Left ankle   HARDWARE REMOVAL Left 12/08/2012   Procedure: HARDWARE REMOVAL;  Surgeon: Nadara Mustard, MD;  Location: [facility];  Service: Orthopedics;  Laterality: Left;  Removal Deep Hardware, Take Down Non-Union, Revision Internal Fixation Left Ankle   I & D EXTREMITY Left 01/01/2013   Procedure: IRRIGATION AND DEBRIDEMENT EXTREMITY;  Surgeon: Nadara Mustard, MD;  Location: [facility];  Service: Orthopedics;  Laterality: Left;  Irrigation, Debridement and Placement Antibiotic Beads Left Ankle   INNER EAR SURGERY     OPEN REDUCTION INTERNAL FIXATION (ORIF) SCAPHOID WITH DISTAL RADIUS GRAFT Right 02/01/2013   Procedure: OPEN REDUCTION INTERNAL FIXATION (ORIF) RIGHT SCAPHOID FRACTURE WITH DISTAL RADIUS GRAFT;  Surgeon: Marlowe Shores, MD;  Location: [facility];  Service: Orthopedics;  Laterality: Right;   ORIF ANKLE FRACTURE Left 12/08/2012   Procedure: OPEN REDUCTION INTERNAL FIXATION (ORIF) ANKLE FRACTURE;  Surgeon:  Nadara Mustard, MD;  Location: [facility];  Service: Orthopedics;  Laterality: Left;  Removal Deep Hardware, Take Down Non-Union, Revision Internal Fixation Left Ankle   ORIF ANKLE FRACTURE Right 09/03/2013   Procedure: OPEN REDUCTION INTERNAL FIXATION (ORIF) ANKLE FRACTURE;  Surgeon: Nadara Mustard, MD;  Location: [facility];  Service: Orthopedics;  Laterality: Right;  Open Reduction Internal Fixation Right Fibula   POLYPECTOMY  03/11/2019   Procedure: POLYPECTOMY;  Surgeon: Napoleon Form, MD;  Location: [facility];  Service: Endoscopy;;   SCAPHOID FRACTURE SURGERY  02/04/2013   SHOULDER ARTHROSCOPY  2008   Left   TYMPANOSTOMY TUBE PLACEMENT  1993   Patient Active Problem List   Diagnosis Date Noted   Arthritis of right ankle 08/09/2020   Post-traumatic osteoarthritis, right ankle and foot    Positive colorectal cancer screening using Cologuard test    Otitis media, left 10/15/2018   On home oxygen therapy 08/26/2018   Difficult intravenous access    Chronic respiratory failure with hypoxia (HCC)    Essential hypertension 05/11/2017   Plantar fasciitis of left foot 01/14/2017   Traumatic arthritis of ankle, right 01/07/2017   H/O ankle fusion 11/14/2016   Osteoarthritis of subtalar joint, left 10/16/2016   At risk for adverse drug event 10/08/2016   History of snoring 10/08/2016   Subtalar joint instability, left    Fibrosis of subtalar joint, left 08/30/2016   History of alcohol abuse 07/02/2016   COPD (chronic obstructive pulmonary disease) (HCC) 07/02/2016   Tobacco dependence 06/06/2015   Medication overuse headache 02/28/2015   Localization-related symptomatic epilepsy and epileptic syndromes with complex partial seizures, not intractable, without status epilepticus (HCC) 02/28/2015   Cognitive impairment 02/28/2015   Tobacco abuse 02/28/2015   Brain tumor (HCC) 11/19/2013   Seizures (HCC) 11/19/2013   Memory loss    HA (headache)    Gait disturbance    Falls frequently  10/15/2013   Swelling 09/09/2013   Rash and nonspecific skin eruption 09/09/2013   Ankle fracture, lateral malleolus, closed 09/03/2013   Unspecified vitamin D deficiency 08/12/2013   Polycythemia, secondary    Memory deficit    History of drug abuse (HCC)    GERD (gastroesophageal reflux disease)    S/P tricuspid valve repair    Arthritis    Hyperlipidemia    Prediabetes     PCP: Vara Guardian NP  REFERRING PROVIDER: Adonis Huguenin, NP  REFERRING DIAG: [id number] (ICD-10-CM) - Chronic pain of left knee and LBP  THERAPY DIAG:  Left knee pain, unspecified chronicity  Other low back pain  Muscle weakness (generalized)  Other abnormalities of gait and mobility  Other symptoms and signs involving the musculoskeletal system  Rationale for Evaluation and Treatment: Rehabilitation  ONSET DATE:  3-4 years  SUBJECTIVE:   SUBJECTIVE STATEMENT: Patient states back is doing better. Sleeping  too long aggravates his back. Knee gives him a fit.     EVAL: Patient states he had MRI on back and knee. Patient states they think something is wrong with his back. Patient states knee pain that began about 3-4 years ago. He is unable to walk like he used to and has both ankles fused. He has had numerous ortho surgeries over the years. Has equilibrium problems due to head injury.  PERTINENT HISTORY: COPD, DM, hx DVT, HTN, HLD, hx brain tumor, history of ankle surgery (fusion) bilaterally PAIN:  Are you having pain? Yes: NPRS scale: 8/10 Pain location: left ankle Pain description: heavy Aggravating factors: constant Relieving factors: none  PRECAUTIONS: Fall  WEIGHT BEARING RESTRICTIONS: No  FALLS:  Has patient fallen in last 6 months? Yes. Number of falls 2   OCCUPATION: Disabled  PLOF: Independent  PATIENT GOALS: get check over    OBJECTIVE:   DIAGNOSTIC FINDINGS: MRI lumbar spine 7/9/24IMPRESSION: 1. No acute findings or explanation for the patient's  symptoms. 2. Mild disc bulging and facet hypertrophy as described. No significant spinal stenosis or nerve root encroachment.  PATIENT SURVEYS: FOTO complete next session - unsure if one time visit or not  at beginning of session  COGNITION: Overall cognitive status: Within functional limits for tasks assessed     SENSATION:WFL   POSTURE: rounded shoulders and forward head  PALPATION: TTP grossly lumbar spine  LUMBAR AROM:    04/10/2023      Flexion  50% limited     Extension  25% limited     R ROT       L ROT       R SB  25% limited    L SB 25% limited     * Pain   (Blank rows = not tested)    LOWER EXTREMITY ROM:  Active ROM Right eval Left eval  Hip flexion    Hip extension    Hip abduction    Hip adduction    Hip internal rotation    Hip external rotation    Knee flexion  150  Knee extension  4 hyperextension  Ankle dorsiflexion    Ankle plantarflexion    Ankle inversion    Ankle eversion     (Blank rows = not tested)  LOWER EXTREMITY MMT:  MMT Right eval Left eval Right 05/12/23 Left 05/12/23  Hip flexion 5 5    Hip extension   4+ 4-  Hip abduction   4 4-  Hip adduction      Hip internal rotation      Hip external rotation      Knee flexion 5 5    Knee extension 5 5    Ankle dorsiflexion      Ankle plantarflexion      Ankle inversion      Ankle eversion       (Blank rows = not tested)    FUNCTIONAL TESTS:   Stairs: 4 inch, able to ascend alternating but tends to utilize step too pattern due to L quad weakness and motor control deficits  GAIT: Distance walked: 100 feet Assistive device utilized: None Level of assistance: Complete Independence Comments: L knee hyperextension/Genu recurvatum with limited flexion/extension ROM   TODAY'S TREATMENT:  DATE:  05/12/23 LAQ 1x 10 with 5 second holds  Glute MMT Bridge  2 x 10 SLR 2 x 10 bilateral  Standing Hip abduction 1 x 10 TKE 1 x 10 LLE  04/10/23 Standing lumbar extension 1 x 10 LAQ 1x 10 with 5 second holds    PATIENT EDUCATION:  Education details: 05/12/23:HEP; EVAL: Patient educated on exam findings, POC, scope of PT, HEP, and knee mechanics. Person educated: Patient Education method: Explanation, Demonstration, and Handouts Education comprehension: verbalized understanding, returned demonstration, verbal cues required, and tactile cues required  HOME EXERCISE PROGRAM: Access Code: [email or web address] URL: [email or web address] 05/12/23- Supine Bridge  - 1 x daily - 7 x weekly - 2 sets - 10 reps - Active Straight Leg Raise with Quad Set  - 1 x daily - 7 x weekly - 2 sets - 10 reps - Standing Hip Abduction with Counter Support  - 1 x daily - 7 x weekly - 2 sets - 10 reps - Standing Quad Set (Mirrored)  - 1 x daily - 7 x weekly - 2 sets - 10 reps  Date: 04/10/2023 - Standing Lumbar Extension  - 5 x daily - 7 x weekly - 1-2 sets - 10 reps - Seated Long Arc Quad (Mirrored)  - 3 x daily - 7 x weekly - 1-2 sets - 10 reps - 5 second  hold  ASSESSMENT:  CLINICAL IMPRESSION: Patient returns to clinic after over 1 month due to difficulty with transportation he states. Session limited to patient's late arrival. Patient provided with cueing for previously completed exercises. Patient with decreased glute strength bilateral with R>L impairment. Began additional hip and quad strengthening which is tolerated well with intermittent cueing for LE positioning and mechanics. Patient will continue to benefit from physical therapy in order to improve function and reduce impairment.   OBJECTIVE IMPAIRMENTS: decreased activity tolerance, decreased balance, decreased endurance, decreased mobility, difficulty walking, decreased ROM, decreased strength, increased muscle spasms, impaired flexibility, improper body mechanics, postural dysfunction, and pain.    ACTIVITY LIMITATIONS: carrying, lifting, bending, standing, squatting, stairs, transfers, locomotion level, and caring for others  PARTICIPATION LIMITATIONS: meal prep, cleaning, laundry, shopping, community activity, and yard work  PERSONAL FACTORS: Time since onset of injury/illness/exacerbation and 3+ comorbidities: COPD, DM, hx DVT, HTN, HLD, hx brain tumor, history of ankle surgery (fusion) bilaterally  are also affecting patient's functional outcome.   REHAB POTENTIAL: Good  CLINICAL DECISION MAKING: Stable/uncomplicated  EVALUATION COMPLEXITY: Low   GOALS: Goals reviewed with patient? Yes  SHORT TERM GOALS: Target date: 05/01/2023    Patient will be independent with HEP in order to improve functional outcomes. Baseline: Goal status: INITIAL  2.  Patient will report at least 25% improvement in symptoms for improved quality of life. Baseline: Goal status: INITIAL   LONG TERM GOALS: Target date: 05/22/2023    Patient will report at least 75% improvement in symptoms for improved quality of life. Baseline:  Goal status: INITIAL  2.  Patient will improve FOTO score to predicted outcomes in order to indicate improved tolerance to activity. Baseline:  Goal status: INITIAL  3.  Patient will be able to navigate stairs with reciprocal pattern without compensation in order to demonstrate improved LE strength. Baseline: 4 inch, able to ascend alternating but tends to utilize step too pattern due to L quad weakness and motor control deficits Goal status: INITIAL  4.  Patient will be able to ambulate at least 226 feet in with improved balance and reduce genu recurvatum  in order to demonstrate improved tolerance to activity. Baseline:  Goal status: INITIAL     PLAN:  PT FREQUENCY: 1-2x/week  PT DURATION: 6 weeks  PLANNED INTERVENTIONS: Therapeutic exercises, Therapeutic activity, Neuromuscular re-education, Balance training, Gait training, Patient/Family  education, Joint manipulation, Joint mobilization, Stair training, Orthotic/Fit training, DME instructions, Aquatic Therapy, Dry Needling, Electrical stimulation, Spinal manipulation, Spinal mobilization, Cryotherapy, Moist heat, Compression bandaging, scar mobilization, Splintting, Taping, Traction, Ultrasound, Ionotophoresis 4mg /ml Dexamethasone, and Manual therapy  PLAN FOR NEXT SESSION: complete foto, f/u with HEP, L quad strength and motor control, core and glute strength, functional strength, emphasis on limiting hyperextension, balance training   Reola Mosher Deshondra Worst, PT 05/12/2023, 2:09 PM

## 2023-05-12 NOTE — Patient Instructions (Signed)
Come back 1 month after therapy if knee still hyperextending

## 2023-05-14 ENCOUNTER — Encounter (HOSPITAL_COMMUNITY): Payer: Self-pay | Admitting: Physical Therapy

## 2023-05-14 ENCOUNTER — Ambulatory Visit (HOSPITAL_COMMUNITY): Payer: 59 | Admitting: Physical Therapy

## 2023-05-14 DIAGNOSIS — R2689 Other abnormalities of gait and mobility: Secondary | ICD-10-CM

## 2023-05-14 DIAGNOSIS — M25562 Pain in left knee: Secondary | ICD-10-CM

## 2023-05-14 DIAGNOSIS — M6281 Muscle weakness (generalized): Secondary | ICD-10-CM

## 2023-05-14 DIAGNOSIS — R29898 Other symptoms and signs involving the musculoskeletal system: Secondary | ICD-10-CM

## 2023-05-14 DIAGNOSIS — M5459 Other low back pain: Secondary | ICD-10-CM

## 2023-05-14 NOTE — Therapy (Signed)
OUTPATIENT PHYSICAL THERAPY TREATMENT   Patient Name: Harold Bailey MRN: [id number] DOB:05/13/1966, 57 y.o., male Today's Date: 05/14/2023  END OF SESSION:  PT End of Session - 05/14/23 1301     Visit Number 3    Number of Visits 12    Date for PT Re-Evaluation 05/22/23    Authorization Type Primary: [employer] medicare Secondary: Medicaid     Progress Note Due on Visit 10    PT Start Time 1301    PT Stop Time 1339    PT Time Calculation (min) 38 min    Activity Tolerance Patient tolerated treatment well    Behavior During Therapy WFL for tasks assessed/performed             Past Medical History:  Diagnosis Date   Anxiety    Arthritis    Left ankle, hand   COPD (chronic obstructive pulmonary disease) (HCC)    COPD (chronic obstructive pulmonary disease) (HCC)    Diabetes mellitus without complication (HCC)    Drug abuse (HCC)    DVT (deep venous thrombosis) (HCC)    Bilateral legs   Dysrhythmia    SVT   Gait disturbance    GERD (gastroesophageal reflux disease)    Headache(784.0) 2009   Initiated tx for loss of memory-Brain tumor; 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   SVT (supraventricular tachycardia)    Wears dentures    Wears glasses    Past Surgical History:  Procedure Laterality Date   ABDOMINAL AORTOGRAM W/LOWER EXTREMITY N/A 05/30/2017   Procedure: ABDOMINAL AORTOGRAM W/LOWER EXTREMITY;  Surgeon: Sherren Kerns, MD;  Location: [facility];  Service: Cardiovascular;  Laterality: N/A;   ABDOMINAL AORTOGRAM W/LOWER  EXTREMITY N/A 07/12/2020   Procedure: ABDOMINAL AORTOGRAM W/LOWER EXTREMITY;  Surgeon: Cephus Shelling, MD;  Location: [facility];  Service: Cardiovascular;  Laterality: N/A;   ANKLE ARTHROSCOPY  01/17/2012   Procedure: ANKLE ARTHROSCOPY;  Surgeon: Nadara Mustard, MD;  Location: [facility];  Service: Orthopedics;  Laterality: Left;   ANKLE ARTHROSCOPY WITH FUSION Left 10/02/2016   Procedure: Left Posterior Subtalar Arthrodesis Arthroscopy;  Surgeon: Nadara Mustard, MD;  Location: [facility];  Service: Orthopedics;  Laterality: Left;   ANKLE FUSION Left 11/2012   Dr Worthy Keeler FUSION Right 08/09/2020   Procedure: RIGHT ANKLE FUSION;  Surgeon: Nadara Mustard, MD;  Location: [facility];  Service: Orthopedics;  Laterality: Right;   ANKLE SURGERY  2003   Left   BRAIN SURGERY  2009   Biopsy   COLONOSCOPY WITH PROPOFOL N/A 03/11/2019   Procedure: COLONOSCOPY WITH PROPOFOL;  Surgeon: Napoleon Form, MD;  Location: [facility];  Service: Endoscopy;  Laterality: N/A;   FRACTURE SURGERY  2003   Left ankle   HARDWARE REMOVAL Left 12/08/2012   Procedure: HARDWARE REMOVAL;  Surgeon: Nadara Mustard, MD;  Location: [facility];  Service: Orthopedics;  Laterality: Left;  Removal Deep Hardware, Take Down Non-Union, Revision Internal Fixation Left Ankle   I & D EXTREMITY Left 01/01/2013   Procedure: IRRIGATION AND DEBRIDEMENT EXTREMITY;  Surgeon: Nadara Mustard, MD;  Location: [facility];  Service: Orthopedics;  Laterality: Left;  Irrigation, Debridement and Placement Antibiotic Beads Left Ankle   INNER EAR SURGERY     OPEN REDUCTION INTERNAL FIXATION (ORIF) SCAPHOID WITH DISTAL RADIUS GRAFT Right 02/01/2013   Procedure: OPEN REDUCTION INTERNAL FIXATION (ORIF) RIGHT SCAPHOID FRACTURE WITH DISTAL RADIUS GRAFT;  Surgeon: Marlowe Shores, MD;  Location: [facility];  Service: Orthopedics;  Laterality: Right;   ORIF ANKLE FRACTURE Left 12/08/2012   Procedure: OPEN REDUCTION INTERNAL FIXATION (ORIF) ANKLE FRACTURE;  Surgeon: Nadara Mustard, MD;  Location: [facility];  Service: Orthopedics;  Laterality: Left;  Removal Deep Hardware, Take Down Non-Union, Revision Internal Fixation Left Ankle   ORIF ANKLE FRACTURE Right 09/03/2013   Procedure: OPEN REDUCTION INTERNAL FIXATION (ORIF) ANKLE FRACTURE;  Surgeon: Nadara Mustard, MD;  Location: [facility];  Service: Orthopedics;  Laterality: Right;  Open Reduction Internal Fixation Right Fibula   POLYPECTOMY  03/11/2019   Procedure: POLYPECTOMY;  Surgeon: Napoleon Form, MD;  Location: [facility];  Service: Endoscopy;;   SCAPHOID FRACTURE SURGERY  02/04/2013   SHOULDER ARTHROSCOPY  2008   Left   TYMPANOSTOMY TUBE PLACEMENT  1993   Patient Active Problem List   Diagnosis Date Noted   Acute maxillary sinusitis 10/11/2022   Anxiety 10/11/2022   Chest pain 10/11/2022   Migraine 10/11/2022   Arthritis of right ankle 08/09/2020   Post-traumatic osteoarthritis, right ankle and foot    Positive colorectal cancer screening using Cologuard test    Left ear pain 02/16/2019   Otitis media, left 10/15/2018   On home oxygen therapy 08/26/2018   Insomnia 12/16/2017   Deep venous thrombosis of upper extremity (HCC) 06/23/2017   Difficult intravenous access    Chronic respiratory failure with hypoxia (HCC)    Essential hypertension 05/11/2017   Abnormal stress test 05/07/2017   History of seizures 05/06/2017   Paroxysmal SVT (supraventricular tachycardia) 05/06/2017   Plantar fasciitis of left foot 01/14/2017   Traumatic arthritis of ankle, right 01/07/2017   H/O ankle fusion 11/14/2016   Osteoarthritis of subtalar joint, left 10/16/2016   At risk for adverse drug event 10/08/2016   History of snoring 10/08/2016   Subtalar joint instability, left    Fibrosis of subtalar joint, left 08/30/2016   History of alcohol abuse 07/02/2016   COPD (chronic obstructive pulmonary disease) (HCC) 07/02/2016   Otorrhagia of left ear 07/01/2016   Perforation of left tympanic membrane 07/01/2016    Tobacco dependence 06/06/2015   Medication overuse headache 02/28/2015   Localization-related symptomatic epilepsy and epileptic syndromes with complex partial seizures, not intractable, without status epilepticus (HCC) 02/28/2015   Cognitive impairment 02/28/2015   Tobacco abuse 02/28/2015   Brain tumor (HCC) 11/19/2013   Seizures (HCC) 11/19/2013   Memory loss    HA (headache)    Gait disturbance    Falls frequently 10/15/2013   Swelling 09/09/2013   Rash and nonspecific skin eruption 09/09/2013   Ankle fracture, lateral malleolus, closed 09/03/2013   Unspecified vitamin D deficiency 08/12/2013   Polycythemia, secondary    Memory deficit    History of drug abuse (HCC)    GERD (gastroesophageal reflux disease)    S/P tricuspid valve repair    Arthritis    Hyperlipidemia    Prediabetes  PCP: Vara Guardian NP  REFERRING PROVIDER: Adonis Huguenin, NP  REFERRING DIAG: [phone] (ICD-10-CM) - Chronic pain of left knee and LBP  THERAPY DIAG:  Left knee pain, unspecified chronicity  Other low back pain  Muscle weakness (generalized)  Other abnormalities of gait and mobility  Other symptoms and signs involving the musculoskeletal system  Rationale for Evaluation and Treatment: Rehabilitation  ONSET DATE: 3-4 years  SUBJECTIVE:   SUBJECTIVE STATEMENT: Patient states he has been doing some walking. Has been trying to keep knee straighter with walking.     EVAL: Patient states he had MRI on back and knee. Patient states they think something is wrong with his back. Patient states knee pain that began about 3-4 years ago. He is unable to walk like he used to and has both ankles fused. He has had numerous ortho surgeries over the years. Has equilibrium problems due to head injury.  PERTINENT HISTORY: COPD, DM, hx DVT, HTN, HLD, hx brain tumor, history of ankle surgery (fusion) bilaterally PAIN:  Are you having pain? Yes: NPRS scale: 7/10 Pain location: left  ankle Pain description: heavy Aggravating factors: constant Relieving factors: none  PRECAUTIONS: Fall  WEIGHT BEARING RESTRICTIONS: No  FALLS:  Has patient fallen in last 6 months? Yes. Number of falls 2   OCCUPATION: Disabled  PLOF: Independent  PATIENT GOALS: get check over    OBJECTIVE:   DIAGNOSTIC FINDINGS: MRI lumbar spine 7/9/24IMPRESSION: 1. No acute findings or explanation for the patient's symptoms. 2. Mild disc bulging and facet hypertrophy as described. No significant spinal stenosis or nerve root encroachment.  PATIENT SURVEYS: FOTO complete next session - unsure if one time visit or not  at beginning of session  COGNITION: Overall cognitive status: Within functional limits for tasks assessed     SENSATION:WFL   POSTURE: rounded shoulders and forward head  PALPATION: TTP grossly lumbar spine  LUMBAR AROM:    04/10/2023      Flexion  50% limited     Extension  25% limited     R ROT       L ROT       R SB  25% limited    L SB 25% limited     * Pain   (Blank rows = not tested)    LOWER EXTREMITY ROM:  Active ROM Right eval Left eval  Hip flexion    Hip extension    Hip abduction    Hip adduction    Hip internal rotation    Hip external rotation    Knee flexion  150  Knee extension  4 hyperextension  Ankle dorsiflexion    Ankle plantarflexion    Ankle inversion    Ankle eversion     (Blank rows = not tested)  LOWER EXTREMITY MMT:  MMT Right eval Left eval Right 05/12/23 Left 05/12/23  Hip flexion 5 5    Hip extension   4+ 4-  Hip abduction   4 4-  Hip adduction      Hip internal rotation      Hip external rotation      Knee flexion 5 5    Knee extension 5 5    Ankle dorsiflexion      Ankle plantarflexion      Ankle inversion      Ankle eversion       (Blank rows = not tested)    FUNCTIONAL TESTS:   Stairs: 4 inch, able to ascend alternating but tends to  utilize step too pattern due to L quad weakness and motor  control deficits  GAIT: Distance walked: 100 feet Assistive device utilized: None Level of assistance: Complete Independence Comments: L knee hyperextension/Genu recurvatum with limited flexion/extension ROM   TODAY'S TREATMENT:                                                                                                                              DATE:  05/14/23 Nustep 5 minutes level2  seat 14 for dynamic warm up and conditioning, cueing for limiting knee extension to neutral Standing march 1 x 10 bilateral  Standing Hip abduction 1 x 10 Step up 4 inch 1 x 10 bilateral  Step up 6 inch 1 x 10 bilateral   05/12/23 LAQ 1x 10 with 5 second holds  Glute MMT Bridge 2 x 10 SLR 2 x 10 bilateral  Standing Hip abduction 1 x 10 TKE 1 x 10 LLE  04/10/23 Standing lumbar extension 1 x 10 LAQ 1x 10 with 5 second holds    PATIENT EDUCATION:  Education details: 05/12/23:HEP; EVAL: Patient educated on exam findings, POC, scope of PT, HEP, and knee mechanics. Person educated: Patient Education method: Explanation, Demonstration, and Handouts Education comprehension: verbalized understanding, returned demonstration, verbal cues required, and tactile cues required  HOME EXERCISE PROGRAM: Access Code: [email or web address] URL: [email or web address] 05/14/23  05/12/23- Supine Bridge  - 1 x daily - 7 x weekly - 2 sets - 10 reps - Active Straight Leg Raise with Quad Set  - 1 x daily - 7 x weekly - 2 sets - 10 reps - Standing Hip Abduction with Counter Support  - 1 x daily - 7 x weekly - 2 sets - 10 reps - Standing Quad Set (Mirrored)  - 1 x daily - 7 x weekly - 2 sets - 10 reps  Date: 04/10/2023 - Standing Lumbar Extension  - 5 x daily - 7 x weekly - 1-2 sets - 10 reps - Seated Long Arc Quad (Mirrored)  - 3 x daily - 7 x weekly - 1-2 sets - 10 reps - 5 second  hold  ASSESSMENT:  CLINICAL IMPRESSION: Began session on nustep for dynamic warm up and conditioning with cueing to avoid  hyperextension. Patient tending to thrust into hyperextension with cueing provided for neutral positioning. Able to complete additional functional strengthening with cueing provided. Patient will continue to benefit from physical therapy in order to improve function and reduce impairment.   OBJECTIVE IMPAIRMENTS: decreased activity tolerance, decreased balance, decreased endurance, decreased mobility, difficulty walking, decreased ROM, decreased strength, increased muscle spasms, impaired flexibility, improper body mechanics, postural dysfunction, and pain.   ACTIVITY LIMITATIONS: carrying, lifting, bending, standing, squatting, stairs, transfers, locomotion level, and caring for others  PARTICIPATION LIMITATIONS: meal prep, cleaning, laundry, shopping, community activity, and yard work  PERSONAL FACTORS: Time since onset of injury/illness/exacerbation and 3+ comorbidities: COPD, DM, hx DVT, HTN, HLD, hx brain tumor, history of ankle surgery (fusion)  bilaterally  are also affecting patient's functional outcome.   REHAB POTENTIAL: Good  CLINICAL DECISION MAKING: Stable/uncomplicated  EVALUATION COMPLEXITY: Low   GOALS: Goals reviewed with patient? Yes  SHORT TERM GOALS: Target date: 05/01/2023    Patient will be independent with HEP in order to improve functional outcomes. Baseline: Goal status: INITIAL  2.  Patient will report at least 25% improvement in symptoms for improved quality of life. Baseline: Goal status: INITIAL   LONG TERM GOALS: Target date: 05/22/2023    Patient will report at least 75% improvement in symptoms for improved quality of life. Baseline:  Goal status: INITIAL  2.  Patient will improve FOTO score to predicted outcomes in order to indicate improved tolerance to activity. Baseline:  Goal status: INITIAL  3.  Patient will be able to navigate stairs with reciprocal pattern without compensation in order to demonstrate improved LE strength. Baseline: 4  inch, able to ascend alternating but tends to utilize step too pattern due to L quad weakness and motor control deficits Goal status: INITIAL  4.  Patient will be able to ambulate at least 226 feet in with improved balance and reduce genu recurvatum in order to demonstrate improved tolerance to activity. Baseline:  Goal status: INITIAL     PLAN:  PT FREQUENCY: 1-2x/week  PT DURATION: 6 weeks  PLANNED INTERVENTIONS: Therapeutic exercises, Therapeutic activity, Neuromuscular re-education, Balance training, Gait training, Patient/Family education, Joint manipulation, Joint mobilization, Stair training, Orthotic/Fit training, DME instructions, Aquatic Therapy, Dry Needling, Electrical stimulation, Spinal manipulation, Spinal mobilization, Cryotherapy, Moist heat, Compression bandaging, scar mobilization, Splintting, Taping, Traction, Ultrasound, Ionotophoresis 4mg /ml Dexamethasone, and Manual therapy  PLAN FOR NEXT SESSION: complete foto, f/u with HEP, L quad strength and motor control, core and glute strength, functional strength, emphasis on limiting hyperextension, balance training   Reola Mosher Avaiyah Strubel, PT 05/14/2023, 1:01 PM

## 2023-05-20 ENCOUNTER — Ambulatory Visit (HOSPITAL_COMMUNITY): Payer: 59

## 2023-05-20 DIAGNOSIS — M5459 Other low back pain: Secondary | ICD-10-CM

## 2023-05-20 DIAGNOSIS — M25562 Pain in left knee: Secondary | ICD-10-CM

## 2023-05-20 DIAGNOSIS — R29898 Other symptoms and signs involving the musculoskeletal system: Secondary | ICD-10-CM

## 2023-05-20 DIAGNOSIS — M6281 Muscle weakness (generalized): Secondary | ICD-10-CM

## 2023-05-20 DIAGNOSIS — R2689 Other abnormalities of gait and mobility: Secondary | ICD-10-CM

## 2023-05-20 NOTE — Therapy (Signed)
OUTPATIENT PHYSICAL THERAPY TREATMENT   Patient Name: Harold Bailey MRN: [id number] DOB:13-Jul-1966, 57 y.o., male Today's Date: 05/20/2023  END OF SESSION:  PT End of Session - 05/20/23 1303     Visit Number 4    Number of Visits 12    Date for PT Re-Evaluation 05/22/23    Authorization Type Primary: [employer] medicare Secondary: Medicaid [place]    Progress Note Due on Visit 10    PT Start Time 1303    PT Stop Time 1345    PT Time Calculation (min) 42 min    Activity Tolerance Patient tolerated treatment well    Behavior During Therapy WFL for tasks assessed/performed             Past Medical History:  Diagnosis Date   Anxiety    Arthritis    Left ankle, hand   COPD (chronic obstructive pulmonary disease) (HCC)    COPD (chronic obstructive pulmonary disease) (HCC)    Diabetes mellitus without complication (HCC)    Drug abuse (HCC)    DVT (deep venous thrombosis) (HCC)    Bilateral legs   Dysrhythmia    SVT   Gait disturbance    GERD (gastroesophageal reflux disease)    Headache(784.0) 2009   Initiated tx for loss of memory-Brain tumor; 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   SVT (supraventricular tachycardia)    Wears dentures    Wears glasses    Past Surgical History:  Procedure Laterality Date   ABDOMINAL AORTOGRAM W/LOWER EXTREMITY N/A 05/30/2017   Procedure: ABDOMINAL AORTOGRAM W/LOWER EXTREMITY;  Surgeon: Sherren Kerns, MD;  Location: [facility];  Service: Cardiovascular;  Laterality: N/A;   ABDOMINAL AORTOGRAM W/LOWER  EXTREMITY N/A 07/12/2020   Procedure: ABDOMINAL AORTOGRAM W/LOWER EXTREMITY;  Surgeon: Cephus Shelling, MD;  Location: [facility];  Service: Cardiovascular;  Laterality: N/A;   ANKLE ARTHROSCOPY  01/17/2012   Procedure: ANKLE ARTHROSCOPY;  Surgeon: Nadara Mustard, MD;  Location: [facility];  Service: Orthopedics;  Laterality: Left;   ANKLE ARTHROSCOPY WITH FUSION Left 10/02/2016   Procedure: Left Posterior Subtalar Arthrodesis Arthroscopy;  Surgeon: Nadara Mustard, MD;  Location: [facility];  Service: Orthopedics;  Laterality: Left;   ANKLE FUSION Left 11/2012   Dr Worthy Keeler FUSION Right 08/09/2020   Procedure: RIGHT ANKLE FUSION;  Surgeon: Nadara Mustard, MD;  Location: [facility];  Service: Orthopedics;  Laterality: Right;   ANKLE SURGERY  2003   Left   BRAIN SURGERY  2009   Biopsy   COLONOSCOPY WITH PROPOFOL N/A 03/11/2019   Procedure: COLONOSCOPY WITH PROPOFOL;  Surgeon: Napoleon Form, MD;  Location: [facility];  Service: Endoscopy;  Laterality: N/A;   FRACTURE SURGERY  2003   Left ankle   HARDWARE REMOVAL Left 12/08/2012   Procedure: HARDWARE REMOVAL;  Surgeon: Nadara Mustard, MD;  Location: [facility];  Service: Orthopedics;  Laterality: Left;  Removal Deep Hardware, Take Down Non-Union, Revision Internal Fixation Left Ankle   I & D EXTREMITY Left 01/01/2013   Procedure: IRRIGATION AND DEBRIDEMENT EXTREMITY;  Surgeon: Nadara Mustard, MD;  Location: [facility];  Service: Orthopedics;  Laterality: Left;  Irrigation, Debridement and Placement Antibiotic Beads Left Ankle   INNER EAR SURGERY     OPEN REDUCTION INTERNAL FIXATION (ORIF) SCAPHOID WITH DISTAL RADIUS GRAFT Right 02/01/2013   Procedure: OPEN REDUCTION INTERNAL FIXATION (ORIF) RIGHT SCAPHOID FRACTURE WITH DISTAL RADIUS GRAFT;  Surgeon: Marlowe Shores, MD;  Location: [facility];  Service: Orthopedics;  Laterality: Right;   ORIF ANKLE FRACTURE Left 12/08/2012   Procedure: OPEN REDUCTION INTERNAL FIXATION (ORIF) ANKLE FRACTURE;  Surgeon: Nadara Mustard, MD;  Location: [facility];  Service: Orthopedics;  Laterality: Left;  Removal Deep Hardware, Take Down Non-Union, Revision Internal Fixation Left Ankle   ORIF ANKLE FRACTURE Right 09/03/2013   Procedure: OPEN REDUCTION INTERNAL FIXATION (ORIF) ANKLE FRACTURE;  Surgeon: Nadara Mustard, MD;  Location: [facility];  Service: Orthopedics;  Laterality: Right;  Open Reduction Internal Fixation Right Fibula   POLYPECTOMY  03/11/2019   Procedure: POLYPECTOMY;  Surgeon: Napoleon Form, MD;  Location: [facility];  Service: Endoscopy;;   SCAPHOID FRACTURE SURGERY  02/04/2013   SHOULDER ARTHROSCOPY  2008   Left   TYMPANOSTOMY TUBE PLACEMENT  1993   Patient Active Problem List   Diagnosis Date Noted   Acute maxillary sinusitis 10/11/2022   Anxiety 10/11/2022   Chest pain 10/11/2022   Migraine 10/11/2022   Arthritis of right ankle 08/09/2020   Post-traumatic osteoarthritis, right ankle and foot    Positive colorectal cancer screening using Cologuard test    Left ear pain 02/16/2019   Otitis media, left 10/15/2018   On home oxygen therapy 08/26/2018   Insomnia 12/16/2017   Deep venous thrombosis of upper extremity (HCC) 06/23/2017   Difficult intravenous access    Chronic respiratory failure with hypoxia (HCC)    Essential hypertension 05/11/2017   Abnormal stress test 05/07/2017   History of seizures 05/06/2017   Paroxysmal SVT (supraventricular tachycardia) 05/06/2017   Plantar fasciitis of left foot 01/14/2017   Traumatic arthritis of ankle, right 01/07/2017   H/O ankle fusion 11/14/2016   Osteoarthritis of subtalar joint, left 10/16/2016   At risk for adverse drug event 10/08/2016   History of snoring 10/08/2016   Subtalar joint instability, left    Fibrosis of subtalar joint, left 08/30/2016   History of alcohol abuse 07/02/2016   COPD (chronic obstructive pulmonary disease) (HCC) 07/02/2016   Otorrhagia of left ear 07/01/2016   Perforation of left tympanic membrane 07/01/2016    Tobacco dependence 06/06/2015   Medication overuse headache 02/28/2015   Localization-related symptomatic epilepsy and epileptic syndromes with complex partial seizures, not intractable, without status epilepticus (HCC) 02/28/2015   Cognitive impairment 02/28/2015   Tobacco abuse 02/28/2015   Brain tumor (HCC) 11/19/2013   Seizures (HCC) 11/19/2013   Memory loss    HA (headache)    Gait disturbance    Falls frequently 10/15/2013   Swelling 09/09/2013   Rash and nonspecific skin eruption 09/09/2013   Ankle fracture, lateral malleolus, closed 09/03/2013   Unspecified vitamin D deficiency 08/12/2013   Polycythemia, secondary    Memory deficit    History of drug abuse (HCC)    GERD (gastroesophageal reflux disease)    S/P tricuspid valve repair    Arthritis    Hyperlipidemia    Prediabetes  PCP: Vara Guardian NP  REFERRING PROVIDER: Adonis Huguenin, NP  REFERRING DIAG: [phone] (ICD-10-CM) - Chronic pain of left knee and LBP  THERAPY DIAG:  Other low back pain  Muscle weakness (generalized)  Rationale for Evaluation and Treatment: Rehabilitation  ONSET DATE: 3-4 years  SUBJECTIVE:   SUBJECTIVE STATEMENT: Patient states that the low back pain has not changed. Current pain = 7/10 on low back. Patient reports that he sleeps on his low back. Patient reports that he sleeps on side. Current pain on the L knee = 7/10.    EVAL: Patient states he had MRI on back and knee. Patient states they think something is wrong with his back. Patient states knee pain that began about 3-4 years ago. He is unable to walk like he used to and has both ankles fused. He has had numerous ortho surgeries over the years. Has equilibrium problems due to head injury.  PERTINENT HISTORY: COPD, DM, hx DVT, HTN, HLD, hx brain tumor, history of ankle surgery (fusion) bilaterally PAIN:  Are you having pain? Yes: NPRS scale: 7/10 Pain location: left ankle Pain description:  heavy Aggravating factors: constant Relieving factors: none  PRECAUTIONS: Fall  WEIGHT BEARING RESTRICTIONS: No  FALLS:  Has patient fallen in last 6 months? Yes. Number of falls 2   OCCUPATION: Disabled  PLOF: Independent  PATIENT GOALS: get check over    OBJECTIVE:   DIAGNOSTIC FINDINGS: MRI lumbar spine 7/9/24IMPRESSION: 1. No acute findings or explanation for the patient's symptoms. 2. Mild disc bulging and facet hypertrophy as described. No significant spinal stenosis or nerve root encroachment.  PATIENT SURVEYS: FOTO 55.900 (05/20/23)  COGNITION: Overall cognitive status: Within functional limits for tasks assessed     SENSATION:WFL   POSTURE: rounded shoulders and forward head  PALPATION: TTP grossly lumbar spine  LUMBAR AROM:    04/10/2023      Flexion  50% limited     Extension  25% limited     R ROT       L ROT       R SB  25% limited    L SB 25% limited     * Pain   (Blank rows = not tested)    LOWER EXTREMITY ROM:  Active ROM Right eval Left eval  Hip flexion    Hip extension    Hip abduction    Hip adduction    Hip internal rotation    Hip external rotation    Knee flexion  150  Knee extension  4 hyperextension  Ankle dorsiflexion    Ankle plantarflexion    Ankle inversion    Ankle eversion     (Blank rows = not tested)  LOWER EXTREMITY MMT:  MMT Right eval Left eval Right 05/12/23 Left 05/12/23  Hip flexion 5 5    Hip extension   4+ 4-  Hip abduction   4 4-  Hip adduction      Hip internal rotation      Hip external rotation      Knee flexion 5 5    Knee extension 5 5    Ankle dorsiflexion      Ankle plantarflexion      Ankle inversion      Ankle eversion       (Blank rows = not tested)    FUNCTIONAL TESTS:   Stairs: 4 inch, able to ascend alternating but tends to utilize step too pattern due to L quad weakness and motor control deficits  GAIT: Distance walked: 100 feet Assistive device utilized:  None Level of assistance: Complete Independence Comments: L knee hyperextension/Genu recurvatum with limited flexion/extension ROM   TODAY'S TREATMENT:                                                                                                                              DATE:  05/20/23 Nustep 5 minutes level2  seat 14 for dynamic warm up and conditioning; arms 11 FOTO  Standing march 2 x 10 bilateral  Standing Hip abduction 2 x 10 Lat weight shifting with post pelvic tilts x 10 x 2 Step up 6 inch 1 x 10 bilateral   05/14/23 Nustep 5 minutes level2  seat 14 for dynamic warm up and conditioning, cueing for limiting knee extension to neutral Standing march 1 x 10 bilateral  Standing Hip abduction 1 x 10 Step up 4 inch 1 x 10 bilateral  Step up 6 inch 1 x 10 bilateral   05/12/23 LAQ 1x 10 with 5 second holds  Glute MMT Bridge 2 x 10 SLR 2 x 10 bilateral  Standing Hip abduction 1 x 10 TKE 1 x 10 LLE  04/10/23 Standing lumbar extension 1 x 10 LAQ 1x 10 with 5 second holds    PATIENT EDUCATION:  Education details: 05/12/23:HEP; EVAL: Patient educated on exam findings, POC, scope of PT, HEP, and knee mechanics. Person educated: Patient Education method: Explanation, Demonstration, and Handouts Education comprehension: verbalized understanding, returned demonstration, verbal cues required, and tactile cues required  HOME EXERCISE PROGRAM: Access Code: [email or web address] URL: [email or web address] 05/14/23  05/12/23- Supine Bridge  - 1 x daily - 7 x weekly - 2 sets - 10 reps - Active Straight Leg Raise with Quad Set  - 1 x daily - 7 x weekly - 2 sets - 10 reps - Standing Hip Abduction with Counter Support  - 1 x daily - 7 x weekly - 2 sets - 10 reps - Standing Quad Set (Mirrored)  - 1 x daily - 7 x weekly - 2 sets - 10 reps  Date: 04/10/2023 - Standing Lumbar Extension  - 5 x daily - 7 x weekly - 1-2 sets - 10 reps - Seated Long Arc Quad (Mirrored)  - 3 x daily - 7 x  weekly - 1-2 sets - 10 reps - 5 second  hold  ASSESSMENT:  CLINICAL IMPRESSION: Began session on nustep for dynamic warm up and conditioning with cueing to avoid hyperextension. Patient tending to thrust into hyperextension with cueing provided for neutral positioning. Needs cues to tuck tailbone.  Able to complete additional functional strengthening with cueing provided. Patient will continue to benefit from physical therapy in order to improve function and reduce impairment.   OBJECTIVE IMPAIRMENTS: decreased activity tolerance, decreased balance, decreased endurance, decreased mobility, difficulty walking, decreased ROM, decreased strength, increased muscle spasms, impaired flexibility, improper body mechanics, postural dysfunction, and pain.   ACTIVITY LIMITATIONS: carrying, lifting, bending, standing, squatting, stairs, transfers,  locomotion level, and caring for others  PARTICIPATION LIMITATIONS: meal prep, cleaning, laundry, shopping, community activity, and yard work  PERSONAL FACTORS: Time since onset of injury/illness/exacerbation and 3+ comorbidities: COPD, DM, hx DVT, HTN, HLD, hx brain tumor, history of ankle surgery (fusion) bilaterally  are also affecting patient's functional outcome.   REHAB POTENTIAL: Good  CLINICAL DECISION MAKING: Stable/uncomplicated  EVALUATION COMPLEXITY: Low   GOALS: Goals reviewed with patient? Yes  SHORT TERM GOALS: Target date: 05/01/2023    Patient will be independent with HEP in order to improve functional outcomes. Baseline: Goal status: INITIAL  2.  Patient will report at least 25% improvement in symptoms for improved quality of life. Baseline: Goal status: INITIAL   LONG TERM GOALS: Target date: 05/22/2023    Patient will report at least 75% improvement in symptoms for improved quality of life. Baseline:  Goal status: INITIAL  2.  Patient will improve FOTO score to predicted outcomes in order to indicate improved tolerance to  activity. Baseline:  Goal status: INITIAL  3.  Patient will be able to navigate stairs with reciprocal pattern without compensation in order to demonstrate improved LE strength. Baseline: 4 inch, able to ascend alternating but tends to utilize step too pattern due to L quad weakness and motor control deficits Goal status: INITIAL  4.  Patient will be able to ambulate at least 226 feet in with improved balance and reduce genu recurvatum in order to demonstrate improved tolerance to activity. Baseline:  Goal status: INITIAL     PLAN:  PT FREQUENCY: 1-2x/week  PT DURATION: 6 weeks  PLANNED INTERVENTIONS: Therapeutic exercises, Therapeutic activity, Neuromuscular re-education, Balance training, Gait training, Patient/Family education, Joint manipulation, Joint mobilization, Stair training, Orthotic/Fit training, DME instructions, Aquatic Therapy, Dry Needling, Electrical stimulation, Spinal manipulation, Spinal mobilization, Cryotherapy, Moist heat, Compression bandaging, scar mobilization, Splintting, Taping, Traction, Ultrasound, Ionotophoresis 4mg /ml Dexamethasone, and Manual therapy  PLAN FOR NEXT SESSION: complete foto, f/u with HEP, L quad strength and motor control, core and glute strength, functional strength, emphasis on limiting hyperextension, balance training; reassess next visit  1:46 PM, 05/20/23 Zebulan Hinshaw Small Trinitey Roache MPT [facility] [phone] Ph:[phone]

## 2023-05-22 ENCOUNTER — Ambulatory Visit (HOSPITAL_COMMUNITY): Payer: 59

## 2023-05-22 DIAGNOSIS — R29898 Other symptoms and signs involving the musculoskeletal system: Secondary | ICD-10-CM

## 2023-05-22 DIAGNOSIS — M25562 Pain in left knee: Secondary | ICD-10-CM | POA: Diagnosis not present

## 2023-05-22 DIAGNOSIS — M6281 Muscle weakness (generalized): Secondary | ICD-10-CM

## 2023-05-22 DIAGNOSIS — M5459 Other low back pain: Secondary | ICD-10-CM

## 2023-05-22 DIAGNOSIS — R2689 Other abnormalities of gait and mobility: Secondary | ICD-10-CM

## 2023-05-22 NOTE — Therapy (Signed)
OUTPATIENT PHYSICAL THERAPY TREATMENT/ DISCHARGE PHYSICAL THERAPY DISCHARGE SUMMARY  Visits from Start of Care: 5  Current functional level related to goals / functional outcomes: See below   Remaining deficits: See below   Education / Equipment: See below   Patient agrees to discharge. Patient goals were partially met. Patient is being discharged due to being pleased with the current functional level.    Patient Name: Harold Bailey MRN: [id number] DOB:1966/02/09, 57 y.o., male Today's Date: 05/22/2023  END OF SESSION:  PT End of Session - 05/22/23 1353     Visit Number 5    Number of Visits 12    Date for PT Re-Evaluation 05/22/23    Authorization Type Primary: [employer] medicare Secondary: Medicaid [place]; requested auth 05/20/23 please check    Progress Note Due on Visit 10    PT Start Time 1346    PT Stop Time 1426    PT Time Calculation (min) 40 min    Activity Tolerance Patient tolerated treatment well    Behavior During Therapy WFL for tasks assessed/performed             Past Medical History:  Diagnosis Date   Anxiety    Arthritis    Left ankle, hand   COPD (chronic obstructive pulmonary disease) (HCC)    COPD (chronic obstructive pulmonary disease) (HCC)    Diabetes mellitus without complication (HCC)    Drug abuse (HCC)    DVT (deep venous thrombosis) (HCC)    Bilateral legs   Dysrhythmia    SVT   Gait disturbance    GERD (gastroesophageal reflux disease)    Headache(784.0) 2009   Initiated tx for loss of memory-Brain tumor; 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   SVT (supraventricular tachycardia)    Wears dentures    Wears glasses    Past Surgical History:  Procedure Laterality Date   ABDOMINAL AORTOGRAM W/LOWER EXTREMITY N/A 05/30/2017   Procedure: ABDOMINAL AORTOGRAM W/LOWER EXTREMITY;  Surgeon: Sherren Kerns, MD;  Location: [facility];  Service: Cardiovascular;  Laterality: N/A;   ABDOMINAL AORTOGRAM W/LOWER EXTREMITY N/A 07/12/2020   Procedure: ABDOMINAL AORTOGRAM W/LOWER EXTREMITY;  Surgeon: Cephus Shelling, MD;  Location: [facility];  Service: Cardiovascular;  Laterality: N/A;   ANKLE ARTHROSCOPY  01/17/2012   Procedure: ANKLE ARTHROSCOPY;  Surgeon: Nadara Mustard, MD;  Location: [facility];  Service: Orthopedics;  Laterality: Left;   ANKLE ARTHROSCOPY WITH FUSION Left 10/02/2016   Procedure: Left Posterior Subtalar Arthrodesis Arthroscopy;  Surgeon: Nadara Mustard, MD;  Location: [facility];  Service: Orthopedics;  Laterality: Left;   ANKLE FUSION Left 11/2012   Dr Worthy Keeler FUSION Right 08/09/2020   Procedure: RIGHT ANKLE FUSION;  Surgeon: Nadara Mustard, MD;  Location: [facility] OR;  Service: Orthopedics;  Laterality: Right;   ANKLE SURGERY  2003   Left   BRAIN SURGERY  2009  Biopsy   COLONOSCOPY WITH PROPOFOL N/A 03/11/2019   Procedure: COLONOSCOPY WITH PROPOFOL;  Surgeon: Napoleon Form, MD;  Location: [facility];  Service: Endoscopy;  Laterality: N/A;   FRACTURE SURGERY  2003   Left ankle   HARDWARE REMOVAL Left 12/08/2012   Procedure: HARDWARE REMOVAL;  Surgeon: Nadara Mustard, MD;  Location: [facility];  Service: Orthopedics;  Laterality: Left;  Removal Deep Hardware, Take Down Non-Union, Revision Internal Fixation Left Ankle   I & D EXTREMITY Left 01/01/2013   Procedure: IRRIGATION AND DEBRIDEMENT EXTREMITY;  Surgeon: Nadara Mustard, MD;  Location: [facility];  Service: Orthopedics;  Laterality: Left;  Irrigation, Debridement and Placement Antibiotic Beads Left Ankle   INNER EAR SURGERY      OPEN REDUCTION INTERNAL FIXATION (ORIF) SCAPHOID WITH DISTAL RADIUS GRAFT Right 02/01/2013   Procedure: OPEN REDUCTION INTERNAL FIXATION (ORIF) RIGHT SCAPHOID FRACTURE WITH DISTAL RADIUS GRAFT;  Surgeon: Marlowe Shores, MD;  Location: [facility];  Service: Orthopedics;  Laterality: Right;   ORIF ANKLE FRACTURE Left 12/08/2012   Procedure: OPEN REDUCTION INTERNAL FIXATION (ORIF) ANKLE FRACTURE;  Surgeon: Nadara Mustard, MD;  Location: [facility];  Service: Orthopedics;  Laterality: Left;  Removal Deep Hardware, Take Down Non-Union, Revision Internal Fixation Left Ankle   ORIF ANKLE FRACTURE Right 09/03/2013   Procedure: OPEN REDUCTION INTERNAL FIXATION (ORIF) ANKLE FRACTURE;  Surgeon: Nadara Mustard, MD;  Location: [facility];  Service: Orthopedics;  Laterality: Right;  Open Reduction Internal Fixation Right Fibula   POLYPECTOMY  03/11/2019   Procedure: POLYPECTOMY;  Surgeon: Napoleon Form, MD;  Location: [facility];  Service: Endoscopy;;   SCAPHOID FRACTURE SURGERY  02/04/2013   SHOULDER ARTHROSCOPY  2008   Left   TYMPANOSTOMY TUBE PLACEMENT  1993   Patient Active Problem List   Diagnosis Date Noted   Acute maxillary sinusitis 10/11/2022   Anxiety 10/11/2022   Chest pain 10/11/2022   Migraine 10/11/2022   Arthritis of right ankle 08/09/2020   Post-traumatic osteoarthritis, right ankle and foot    Positive colorectal cancer screening using Cologuard test    Left ear pain 02/16/2019   Otitis media, left 10/15/2018   On home oxygen therapy 08/26/2018   Insomnia 12/16/2017   Deep venous thrombosis of upper extremity (HCC) 06/23/2017   Difficult intravenous access    Chronic respiratory failure with hypoxia (HCC)    Essential hypertension 05/11/2017   Abnormal stress test 05/07/2017   History of seizures 05/06/2017   Paroxysmal SVT (supraventricular tachycardia) 05/06/2017   Plantar fasciitis of left foot 01/14/2017   Traumatic arthritis of ankle, right 01/07/2017   H/O ankle fusion 11/14/2016    Osteoarthritis of subtalar joint, left 10/16/2016   At risk for adverse drug event 10/08/2016   History of snoring 10/08/2016   Subtalar joint instability, left    Fibrosis of subtalar joint, left 08/30/2016   History of alcohol abuse 07/02/2016   COPD (chronic obstructive pulmonary disease) (HCC) 07/02/2016   Otorrhagia of left ear 07/01/2016   Perforation of left tympanic membrane 07/01/2016   Tobacco dependence 06/06/2015   Medication overuse headache 02/28/2015   Localization-related symptomatic epilepsy and epileptic syndromes with complex partial seizures, not intractable, without status epilepticus (HCC) 02/28/2015   Cognitive impairment 02/28/2015   Tobacco abuse 02/28/2015   Brain tumor (HCC) 11/19/2013   Seizures (HCC) 11/19/2013   Memory loss    HA (headache)    Gait disturbance    Falls frequently 10/15/2013   Swelling 09/09/2013   Rash  and nonspecific skin eruption 09/09/2013   Ankle fracture, lateral malleolus, closed 09/03/2013   Unspecified vitamin D deficiency 08/12/2013   Polycythemia, secondary    Memory deficit    History of drug abuse (HCC)    GERD (gastroesophageal reflux disease)    S/P tricuspid valve repair    Arthritis    Hyperlipidemia    Prediabetes     PCP: Vara Guardian NP  REFERRING PROVIDER: Adonis Huguenin, NP  REFERRING DIAG: [phone] (ICD-10-CM) - Chronic pain of left knee and LBP  THERAPY DIAG:  Other low back pain  Muscle weakness (generalized)  Left knee pain, unspecified chronicity  Other symptoms and signs involving the musculoskeletal system  Other abnormalities of gait and mobility  Rationale for Evaluation and Treatment: Rehabilitation  ONSET DATE: 3-4 years  SUBJECTIVE:   SUBJECTIVE STATEMENT: 6/10 pain today left knee; but he reports he "just has not felt good" the past 5 days but no specific symptoms.  "80%" overall better; Maybe "20%" better pain wise since starting therapy; starts he is more aware  of trying to keep his left knee from hyperextending    EVAL: Patient states he had MRI on back and knee. Patient states they think something is wrong with his back. Patient states knee pain that began about 3-4 years ago. He is unable to walk like he used to and has both ankles fused. He has had numerous ortho surgeries over the years. Has equilibrium problems due to head injury.  PERTINENT HISTORY: COPD, DM, hx DVT, HTN, HLD, hx brain tumor, history of ankle surgery (fusion) bilaterally PAIN:  Are you having pain? Yes: NPRS scale: 7/10 Pain location: left ankle Pain description: heavy Aggravating factors: constant Relieving factors: none  PRECAUTIONS: Fall  WEIGHT BEARING RESTRICTIONS: No  FALLS:  Has patient fallen in last 6 months? Yes. Number of falls 2   OCCUPATION: Disabled  PLOF: Independent  PATIENT GOALS: get check over    OBJECTIVE:   DIAGNOSTIC FINDINGS: MRI lumbar spine 7/9/24IMPRESSION: 1. No acute findings or explanation for the patient's symptoms. 2. Mild disc bulging and facet hypertrophy as described. No significant spinal stenosis or nerve root encroachment.  PATIENT SURVEYS: FOTO 55.900 (05/20/23)  COGNITION: Overall cognitive status: Within functional limits for tasks assessed     SENSATION:WFL   POSTURE: rounded shoulders and forward head  PALPATION: TTP grossly lumbar spine  LUMBAR AROM:    04/10/2023      Flexion  50% limited     Extension  25% limited     R ROT       L ROT       R SB  25% limited    L SB 25% limited     * Pain   (Blank rows = not tested)    LOWER EXTREMITY ROM:  Active ROM Right eval Left eval  Hip flexion    Hip extension    Hip abduction    Hip adduction    Hip internal rotation    Hip external rotation    Knee flexion  150  Knee extension  4 hyperextension  Ankle dorsiflexion    Ankle plantarflexion    Ankle inversion    Ankle eversion     (Blank rows = not tested)  LOWER EXTREMITY MMT:  MMT  Right eval Left eval Right 05/12/23 Left 05/12/23 Right 05/22/23 Left 05/22/23  Hip flexion 5 5   5 5   Hip extension   4+ 4- 5 4+  Hip abduction  4 4- 5 4+  Hip adduction        Hip internal rotation        Hip external rotation        Knee flexion 5 5   5  4+  Knee extension 5 5   5 5   Ankle dorsiflexion        Ankle plantarflexion        Ankle inversion        Ankle eversion         (Blank rows = not tested)    FUNCTIONAL TESTS:   Stairs: 4 inch, able to ascend alternating but tends to utilize step too pattern due to L quad weakness and motor control deficits  GAIT: Distance walked: 100 feet Assistive device utilized: None Level of assistance: Complete Independence Comments: L knee hyperextension/Genu recurvatum with limited flexion/extension ROM   TODAY'S TREATMENT:                                                                                                                              DATE:  05/22/23 Progress note Noted atrophy left quad versus right MMT's see above 2 MWT 302 ft no AD   05/20/23 Nustep 5 minutes level2  seat 14 for dynamic warm up and conditioning; arms 11 FOTO  Standing march 2 x 10 bilateral  Standing Hip abduction 2 x 10 Lat weight shifting with post pelvic tilts x 10 x 2 Step up 6 inch 1 x 10 bilateral   05/14/23 Nustep 5 minutes level2  seat 14 for dynamic warm up and conditioning, cueing for limiting knee extension to neutral Standing march 1 x 10 bilateral  Standing Hip abduction 1 x 10 Step up 4 inch 1 x 10 bilateral  Step up 6 inch 1 x 10 bilateral   05/12/23 LAQ 1x 10 with 5 second holds  Glute MMT Bridge 2 x 10 SLR 2 x 10 bilateral  Standing Hip abduction 1 x 10 TKE 1 x 10 LLE  04/10/23 Standing lumbar extension 1 x 10 LAQ 1x 10 with 5 second holds    PATIENT EDUCATION:  Education details: 05/12/23:HEP; EVAL: Patient educated on exam findings, POC, scope of PT, HEP, and knee mechanics. Person educated:  Patient Education method: Explanation, Demonstration, and Handouts Education comprehension: verbalized understanding, returned demonstration, verbal cues required, and tactile cues required  HOME EXERCISE PROGRAM: Access Code: [email or web address] URL: [email or web address] 05/14/23  05/12/23- Supine Bridge  - 1 x daily - 7 x weekly - 2 sets - 10 reps - Active Straight Leg Raise with Quad Set  - 1 x daily - 7 x weekly - 2 sets - 10 reps - Standing Hip Abduction with Counter Support  - 1 x daily - 7 x weekly - 2 sets - 10 reps - Standing Quad Set (Mirrored)  - 1 x daily - 7 x weekly - 2 sets - 10 reps  Date: 04/10/2023 - Standing Lumbar Extension  -  5 x daily - 7 x weekly - 1-2 sets - 10 reps - Seated Long Arc Quad (Mirrored)  - 3 x daily - 7 x weekly - 1-2 sets - 10 reps - 5 second  hold  ASSESSMENT:  CLINICAL IMPRESSION: Progress note; did FOTO last visit; good improvement with strength noted; still has some mild weakness left hip noted; met 2 MWT goal today.  Met 2/2 short term and 2/4 long term goals.  Patient verbalizes compliance with HEP; using treadmill; is agreeable to discharge at this time.    OBJECTIVE IMPAIRMENTS: decreased activity tolerance, decreased balance, decreased endurance, decreased mobility, difficulty walking, decreased ROM, decreased strength, increased muscle spasms, impaired flexibility, improper body mechanics, postural dysfunction, and pain.   ACTIVITY LIMITATIONS: carrying, lifting, bending, standing, squatting, stairs, transfers, locomotion level, and caring for others  PARTICIPATION LIMITATIONS: meal prep, cleaning, laundry, shopping, community activity, and yard work  PERSONAL FACTORS: Time since onset of injury/illness/exacerbation and 3+ comorbidities: COPD, DM, hx DVT, HTN, HLD, hx brain tumor, history of ankle surgery (fusion) bilaterally  are also affecting patient's functional outcome.   REHAB POTENTIAL: Good  CLINICAL DECISION MAKING:  Stable/uncomplicated  EVALUATION COMPLEXITY: Low   GOALS: Goals reviewed with patient? Yes  SHORT TERM GOALS: Target date: 05/01/2023    Patient will be independent with HEP in order to improve functional outcomes. Baseline: Goal status:met  2.  Patient will report at least 25% improvement in symptoms for improved quality of life. Baseline: Goal status: met   LONG TERM GOALS: Target date: 05/22/2023    Patient will report at least 75% improvement in symptoms for improved quality of life. Baseline:  Goal status: met  2.  Patient will improve FOTO score to predicted outcomes in order to indicate improved tolerance to activity. Baseline: did not do FOTO intake at eval Goal status: INITIAL  3.  Patient will be able to navigate stairs with reciprocal pattern without compensation in order to demonstrate improved LE strength. Baseline: 4 inch, able to ascend alternating but tends to utilize step too pattern due to L quad weakness and motor control deficits Goal status: INITIAL  4.  Patient will be able to ambulate at least 226 feet in with improved balance and reduce genu recurvatum in order to demonstrate improved tolerance to activity. Baseline: 302 ft Goal status: met     PLAN:  PT FREQUENCY: 1-2x/week  PT DURATION: 6 weeks  PLANNED INTERVENTIONS: Therapeutic exercises, Therapeutic activity, Neuromuscular re-education, Balance training, Gait training, Patient/Family education, Joint manipulation, Joint mobilization, Stair training, Orthotic/Fit training, DME instructions, Aquatic Therapy, Dry Needling, Electrical stimulation, Spinal manipulation, Spinal mobilization, Cryotherapy, Moist heat, Compression bandaging, scar mobilization, Splintting, Taping, Traction, Ultrasound, Ionotophoresis 4mg /ml Dexamethasone, and Manual therapy  PLAN FOR NEXT SESSION: discharge.   2:26 PM, 05/22/23 Carlethia Mesquita Small Mandel Seiden MPT [place] [facility] [place] [phone]

## 2023-05-27 ENCOUNTER — Encounter (HOSPITAL_COMMUNITY): Payer: 59

## 2023-06-05 ENCOUNTER — Telehealth: Payer: Self-pay | Admitting: Family

## 2023-06-09 NOTE — Progress Notes (Deleted)
Cardiology Office Note    Date:  06/09/2023  ID:  Harold Bailey, DOB [birth year], MRN [id number] PCP:  Harold Pacer, NP  Cardiologist:  Harold Haws, MD  Electrophysiologist:  None   Chief Complaint: ***  History of Present Illness: .    Harold Bailey is a 57 y.o. male with visit-pertinent history of erythrocytosis and elevated CO level followed by hematology, prior DVTs, PAD previously managed by vascular surgery, SVT, COPD, GERD, HTN, HLD, aortic atherosclerosis, memory deficit following brain tumor excision, seizures, anxiety, arthritis, tobacco abuse seen for follow-up.   In 2018 he had a complex admission for MRSA tracheobronchitis complicated by LUE DVT, LLE DVT, and occlusion of the distal L SFA. He was treated with Eliquis for his VTE for 6 months, stopped at the direction of Dr. Randie Bailey. He underwent PV angio and was recommended to consider L femoral-PT bypass but this was deferred until he was stronger. He never did have this done from what I can see. He underwent repeat PV angio in 2021 with R and L distal SFA occlusion managed medically. He saw Dr. Eden Bailey in 12/2021 for chest pain and palpitations. Palpitations were felt benign sounding. He does carry history of SVT but has not had monitoring in our system. Nuc 01/2022 showed evidence for prior inferior MI but no active ischemia, EF 60%. Dr. Eden Bailey did not feel workup needed unless having more chest pain. Of note CT lung CA screening in 11/2022 showed no definite coronary atherosclerosis. No prior echo.  Co lvl  Etoh? VVS needing f/u Lipids, regular labs due last 2022/2021  Abnormal stress test SVT Essential HTN HLD   Labwork independently reviewed: 04/2023 Hgb 16.5, plt 213 2022 NA 134, K 5.0, Cr 0.84, LFTs ok 2021 LDL 47, trig 364 by PCP, TSH wnl  ROS: .    Please see the history of present illness. Otherwise, review of systems is positive for ***.  All other systems are reviewed and otherwise  negative.  Studies Reviewed: Marland Kitchen    EKG:  EKG is ordered today, personally reviewed, demonstrating ***  CV Studies: Cardiac studies reviewed are outlined and summarized above. Otherwise please see EMR for full report.   Current Reported Medications:.    No outpatient medications have been marked as taking for the 06/10/23 encounter (Appointment) with Harold Montana, PA-C.    Physical Exam:    VS:  There were no vitals taken for this visit.   Wt Readings from Last 3 Encounters:  05/12/23 202 lb (91.6 kg)  05/05/23 201 lb 11.5 oz (91.5 kg)  02/06/23 212 lb (96.2 kg)    GEN: Well nourished, well developed in no acute distress NECK: No JVD; No carotid bruits CARDIAC: ***RRR, no murmurs, rubs, gallops RESPIRATORY:  Clear to auscultation without rales, wheezing or rhonchi  ABDOMEN: Soft, non-tender, non-distended EXTREMITIES:  No edema; No acute deformity   Asessement and Plan:.     ***     Disposition: F/u with ***  Signed, Harold Montana, PA-C

## 2023-06-10 ENCOUNTER — Ambulatory Visit: Payer: 59 | Admitting: Physician Assistant

## 2023-06-10 DIAGNOSIS — I10 Essential (primary) hypertension: Secondary | ICD-10-CM

## 2023-06-10 DIAGNOSIS — I471 Supraventricular tachycardia, unspecified: Secondary | ICD-10-CM

## 2023-06-10 DIAGNOSIS — E785 Hyperlipidemia, unspecified: Secondary | ICD-10-CM

## 2023-06-10 DIAGNOSIS — R9439 Abnormal result of other cardiovascular function study: Secondary | ICD-10-CM

## 2023-06-16 ENCOUNTER — Telehealth: Payer: Self-pay | Admitting: Orthopedic Surgery

## 2023-06-16 NOTE — Telephone Encounter (Signed)
Your note states will give him a brace with extension lock   Is that a brace you want here? Playmaker or something else?

## 2023-06-16 NOTE — Telephone Encounter (Signed)
Dr. Mort Sawyers pt - pt lvm stating a knee brace was discussed before and he is wanting a brace.  [phone]

## 2023-06-20 ENCOUNTER — Encounter: Payer: Self-pay | Admitting: Hematology

## 2023-06-23 NOTE — Telephone Encounter (Signed)
He needs appointment with Dr Romeo Apple and we will order and fit him with a playmaker XROM here in the office so he doesn't have to go to brace shop

## 2023-06-23 NOTE — Telephone Encounter (Signed)
Dr. Mort Sawyers pt - pt lvm this morning stating he called a week ago and he still has not gotten a call back regarding a brace.  He would like a call back [phone].

## 2023-06-23 NOTE — Telephone Encounter (Signed)
Dr Romeo Apple will ask our brace rep today if he can get through her or if we have to send to [facility]   I left message for him to advise I will let him know when I know.

## 2023-06-27 ENCOUNTER — Ambulatory Visit: Payer: 59 | Admitting: Pulmonary Disease

## 2023-06-27 ENCOUNTER — Encounter: Payer: Self-pay | Admitting: Primary Care

## 2023-06-27 ENCOUNTER — Inpatient Hospital Stay: Payer: 59 | Attending: Hematology

## 2023-06-27 ENCOUNTER — Inpatient Hospital Stay: Payer: 59

## 2023-06-27 ENCOUNTER — Ambulatory Visit (INDEPENDENT_AMBULATORY_CARE_PROVIDER_SITE_OTHER): Payer: 59 | Admitting: Primary Care

## 2023-06-27 VITALS — BP 97/62 | HR 78 | Ht 72.0 in | Wt 205.0 lb

## 2023-06-27 DIAGNOSIS — F17200 Nicotine dependence, unspecified, uncomplicated: Secondary | ICD-10-CM | POA: Diagnosis not present

## 2023-06-27 DIAGNOSIS — J449 Chronic obstructive pulmonary disease, unspecified: Secondary | ICD-10-CM | POA: Diagnosis not present

## 2023-06-27 DIAGNOSIS — D751 Secondary polycythemia: Secondary | ICD-10-CM | POA: Diagnosis present

## 2023-06-27 DIAGNOSIS — J9611 Chronic respiratory failure with hypoxia: Secondary | ICD-10-CM

## 2023-06-27 LAB — CBC
HCT: 49.9 % (ref 39.0–52.0)
Hemoglobin: 16.3 g/dL (ref 13.0–17.0)
MCH: 30 pg (ref 26.0–34.0)
MCHC: 32.7 g/dL (ref 30.0–36.0)
MCV: 91.9 fL (ref 80.0–100.0)
Platelets: 208 10*3/uL (ref 150–400)
RBC: 5.43 MIL/uL (ref 4.22–5.81)
RDW: 15.9 % — ABNORMAL HIGH (ref 11.5–15.5)
WBC: 10.9 10*3/uL — ABNORMAL HIGH (ref 4.0–10.5)
nRBC: 0 % (ref 0.0–0.2)

## 2023-06-27 MED ORDER — NICOTINE 10 MG IN INHA: 1.0000 | RESPIRATORY_TRACT | 0 refills | Status: DC | PRN

## 2023-06-27 NOTE — Patient Instructions (Addendum)
Recommendations:  - Continue Trelegy ; Use Albuterol 2 puffs every 4-6 hours for shortness of breath  - Start nicotine inhaler as instructive  - Aim to walk on treadmill or 20 mins every Monday, Wednesday and Friday (increase actively as tolerates) - You had low dose CT of your chest in March 2024 that showed no concerning lung nodules, emphysema - Due for follow-up CT chest in March 2025 - CONTINUE TO WORK ON SMOKING CESSATION, WE HAVE PICKED A QUIT DATE OF DECEMBER 28TH (AIM TO QUIT SMOKING BY JANUARY)   Orders: Ambulatory walk on Room air    Follow-up - 3 months with Beth NP/ Friday virtual clinic ok (COPD/ follow up on smoking cessation)   Nicotine Inhaler What is this medication? NICOTINE (NIK [place] teen) helps you quit smoking. It reduces cravings for nicotine, the addictive substance found in tobacco. It may also help reduce symptoms of withdrawal. It is most effective when used in combination with a stop-smoking program. This medicine may be used for other purposes; ask your health care provider or pharmacist if you have questions. COMMON BRAND NAME(S): Nicotrol What should I tell my care team before I take this medication? They need to know if you have any of these conditions: Diabetes Heart disease, angina, irregular heartbeat, or previous heart attack High blood pressure High thyroid levels Lung or breathing disease, such as asthma Pheochromocytoma Seizures or history of seizures Stomach ulcers, other stomach or intestine problems An unusual or allergic reaction to nicotine, other medications, foods, dyes, or preservatives Pregnant or trying to get pregnant Breastfeeding How should I use this medication? This medication is inhaled through the mouth. Follow the directions on the prescription label. Do not use more often than directed. Make sure that you are using your inhaler correctly. Ask your care team if you have any questions. This medication comes with  INSTRUCTIONS FOR USE. Ask your pharmacist for directions on how to use this medication. Read the information carefully. Talk to your pharmacist or care team if you have questions. Talk to your care team about the use of this medication in children. Special care may be needed. Overdosage: If you think you have taken too much of this medicine contact a poison control center or emergency room at once. NOTE: This medicine is only for you. Do not share this medicine with others. What if I miss a dose? If you miss a dose, use it as soon as you can. If it is almost time for your next dose, use only that dose. Do not use double or extra doses. What may interact with this medication? Certain medications for lung or breathing disease, such as asthma Medications for blood pressure Medications for depression This list may not describe all possible interactions. Give your health care provider a list of all the medicines, herbs, non-prescription drugs, or dietary supplements you use. Also tell them if you smoke, drink alcohol, or use illegal drugs. Some items may interact with your medicine. What should I watch for while using this medication? Visit your care team for regular checks on your progress. Talk to your care team about what you can do to improve your chances of quitting. What side effects may I notice from receiving this medication? Side effects that you should report to your care team as soon as possible: Allergic reactions--skin rash, itching, hives, swelling of the face, lips, tongue, or throat Heart palpitations--rapid, pounding, or irregular heartbeat Increase in blood pressure Wheezing or trouble breathing that is worse after  use Side effects that usually do not require medical attention (report to your care team if they continue or are bothersome): Dizziness Headache Heartburn Hiccups Irritation inside the mouth or throat Trouble sleeping This list may not describe all possible side  effects. Call your doctor for medical advice about side effects. You may report side effects to FDA at 1-800-FDA-1088. Where should I keep my medication? This product may have enough nicotine in it to make children and pets sick. Keep it away from children and pets. Store at room temperature below 25 degrees C (77 degrees F). Protect from heat and light. Throw away unused medication after the expiration date. Throw away as directed on the package. NOTE: This sheet is a summary. It may not cover all possible information. If you have questions about this medicine, talk to your doctor, pharmacist, or health care provider.  2024 Elsevier/Gold Standard (2022-09-09 00:00:00)

## 2023-06-27 NOTE — Progress Notes (Signed)
 @Patient  ID: Harold Bailey, male    DOB: [birth year], 57 y.o.   MRN: [id number]  Chief Complaint  Patient presents with   Follow-up    Referring provider: Katherine Basset*  HPI: 57 year old male, current smoker.  Followed by Dr. Vassie Loll for history of emphysema, last seen on 09/03/2022.  06/27/2023 Patient presents today for 3 to 81-month follow-up. Maintained on Trelegy and as needed albuterol. Strongly encouraged to cut back on smoking, advised to use nicotine patches, if he fails could consider Nicotrol inhaler.  He is doing well today. No acute complaints. Cough is much less.  He is compliant with Trelegy, using Albuterol once a day on average.He is able to do most ADLs without issue. Ambulates with cane. Using 4L oxyen at bedtime and as needed with exertion, has tanks. SpO2 low with ambulation today was 89-90% He was able to cut back o both his smoking and drinking since he moved out of his living space with his wife. They are still together. He has cut back to 1ppd (previously smoking 2-2.5 packs per day). He tried nicotine patch but had a hard time getting patch to stick to his skin. He would like to try nicotine inhaler. He has lost weight 40-45lbs in last 6 months due to lifestyle change.  Low-dose CT scan in March 2024 showed lung RADS 1, negative.  Continue with annual CT screening every 12 months.  Mild diffuse bronchial wall thickening with moderate centrilobular paraseptal emphysema suggestive of underlying COPD.   Allergies  Allergen Reactions   Hydrocodone Rash    Can take percocet   Lyrica [Pregabalin] Swelling    Swelling and itching in hands   Other     No narcotics due to history of opioid dependence    Immunization History  Administered Date(s) Administered   Influenza Inj Mdck Quad Pf 06/23/2017, 06/23/2019   Influenza,inj,Quad PF,6+ Mos 06/21/2021   Influenza-Unspecified 06/17/2012, 04/23/2016   Janssen (J&J) SARS-COV-2 Vaccination 12/02/2019    Moderna SARS-COV2 Booster Vaccination 09/28/2020   Tdap 05/19/2012    Past Medical History:  Diagnosis Date   Anxiety    Arthritis    Left ankle, hand   COPD (chronic obstructive pulmonary disease) (HCC)    COPD (chronic obstructive pulmonary disease) (HCC)    Diabetes mellitus without complication (HCC)    Drug abuse (HCC)    DVT (deep venous thrombosis) (HCC)    Bilateral legs   Dysrhythmia    SVT   Gait disturbance    GERD (gastroesophageal reflux disease)    Headache(784.0) 2009   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   SVT (supraventricular tachycardia) (HCC)    Wears dentures    Wears glasses     Tobacco History: Social History   Tobacco Use  Smoking Status Every Day   Current packs/day: 0.50   Average packs/day: 0.5 packs/day for 40.0 years (  20.0 ttl pk-yrs)   Types: Cigarettes  Smokeless Tobacco Never  Tobacco Comments   1 pack every 2 days   HEI. 09/03/2022   Ready to quit: Not Answered Counseling given: Not Answered Tobacco comments: 1 pack every 2 days HEI. 09/03/2022   Outpatient Medications Prior to Visit  Medication Sig Dispense Refill   albuterol (PROVENTIL HFA;VENTOLIN HFA) 108 (90 Base) MCG/ACT inhaler Inhale 2 puffs into the lungs every 6 (six) hours as needed for wheezing or shortness of breath. 1 Inhaler 6   allopurinol (ZYLOPRIM) 300 MG tablet Take 300 mg by mouth daily.     aspirin EC 81 MG tablet Take 81 mg by mouth daily.     Fluticasone-Umeclidin-Vilant (TRELEGY ELLIPTA)  100-62.5-25 MCG/ACT AEPB Inhale 1 puff into the lungs daily. 60 each 1   gabapentin (NEURONTIN) 300 MG capsule Take 300 mg by mouth daily.     hydrOXYzine (ATARAX) 50 MG tablet Take 50 mg by mouth 2 (two) times daily as needed.     levETIRAcetam (KEPPRA) 500 MG tablet TAKE 1 TABLET(500 MG) BY MOUTH TWICE DAILY 60 tablet 3   meclizine (ANTIVERT) 25 MG tablet Take 25 mg by mouth 3 (three) times daily as needed.     meloxicam (MOBIC) 7.5 MG tablet TAKE 1 TABLET(7.5 MG) BY MOUTH DAILY 90 tablet 0   metFORMIN (GLUCOPHAGE) 500 MG tablet Take 500 mg by mouth 2 (two) times daily.     metoprolol succinate (TOPROL-XL) 100 MG 24 hr tablet Take 100 mg by mouth daily. Take with or immediately following a meal.     Multiple Vitamins-Minerals (MULTIVITAMIN WITH MINERALS) tablet Take 2 tablets by mouth daily.      naloxone (NARCAN) nasal spray 4 mg/0.1 mL      Naphazoline-Pheniramine (OPCON-A) 0.027-0.315 % SOLN Place 1 drop into both eyes every 8 (eight) hours as needed (red, irritated eyes).     neomycin-polymyxin-hydrocortisone (CORTISPORIN) 3.5-10000-1 OTIC suspension Place 2 drops into the left ear 2 (two) times daily as needed (ear pain).     Oxycodone HCl 10 MG TABS Take 10 mg by mouth 4 (four) times daily as needed.     oxyCODONE-acetaminophen (PERCOCET/ROXICET) 5-325 MG tablet Take 1 tablet by mouth every 4 (four) hours as needed. 30 tablet 0   OXYGEN Inhale into the lungs. At home oxygen     pantoprazole (PROTONIX) 40 MG tablet Take 40 mg by mouth daily.     polyethylene glycol (MIRALAX / GLYCOLAX) 17 g packet Take 17 g by mouth daily. 14 each 0   rosuvastatin (CRESTOR) 20 MG tablet Take 20 mg by mouth daily.     tamsulosin (FLOMAX) 0.4 MG CAPS capsule Take 0.4 mg by mouth daily.     topiramate (TOPAMAX) 25 MG tablet Take 25 mg by mouth 2 (two) times daily.     traZODone (DESYREL) 100 MG tablet Take 200 mg by mouth at bedtime.     VITAMIN D PO Take 1 capsule by mouth daily.     VOLTAREN 1 % GEL APPLY  2 TO 4 GRAMS EXTERNALLY TO THE AFFECTED AREA TWICE DAILY (Patient taking differently: Apply 4 g topically 4 (four) times daily as needed (pain).) 500 g 0   zolpidem (AMBIEN) 5 MG tablet Take 5 mg by mouth at bedtime as needed.     No facility-administered medications prior to visit.   Review of Systems  Review of Systems  Constitutional: Negative.   Respiratory:  Negative for chest tightness and wheezing.  Physical Exam  BP 97/62   Pulse 78   Ht 6' (1.829 m)   Wt 205 lb (93 kg)   SpO2 90% Comment: RA  BMI 27.80 kg/m  Physical Exam Constitutional:      Appearance: Normal appearance.  HENT:     Mouth/Throat:     Mouth: Mucous membranes are moist.     Pharynx: Oropharynx is clear.  Cardiovascular:     Rate and Rhythm: Normal rate and regular rhythm.  Pulmonary:     Effort: Pulmonary effort is normal.     Breath sounds: Normal breath sounds. No wheezing, rhonchi or rales.     Comments: O2 90% RA Neurological:     General: No focal deficit present.     Mental Status: He is alert and oriented to person, place, and time. Mental status is at baseline.  Psychiatric:        Mood and Affect: Mood normal.        Behavior: Behavior normal.        Thought Content: Thought content normal.        Judgment: Judgment normal.      Lab Results:  CBC    Component Value Date/Time   WBC 9.3 04/28/2023 1414   RBC 5.70 04/28/2023 1414   HGB 16.5 04/28/2023 1414   HCT 52.5 (H) 04/28/2023 1414   PLT 213 04/28/2023 1414   MCV 92.1 04/28/2023 1414   MCH 28.9 04/28/2023 1414   MCHC 31.4 04/28/2023 1414   RDW 16.1 (H) 04/28/2023 1414   LYMPHSABS 2.4 04/28/2023 1414   MONOABS 0.6 04/28/2023 1414   EOSABS 0.3 04/28/2023 1414   BASOSABS 0.0 04/28/2023 1414    BMET    Component Value Date/Time   NA 134 (L) 01/29/2021 1522   K 5.0 01/29/2021 1522   CL 97 01/29/2021 1522   CO2 32 01/29/2021 1522   GLUCOSE 97 01/29/2021 1522   BUN 12 01/29/2021 1522   CREATININE 0.84 01/29/2021  1522   CREATININE 0.89 07/29/2019 1403   CALCIUM 9.7 01/29/2021 1522   GFRNONAA >60 02/01/2020 1124   GFRNONAA 97 07/29/2019 1403   GFRAA >60 02/01/2020 1124   GFRAA 112 07/29/2019 1403    BNP No results found for: "BNP"  ProBNP No results found for: "PROBNP"  Imaging: No results found.   Assessment & Plan:   1. Chronic obstructive pulmonary disease, unspecified COPD type (HCC) (Primary)  2. Chronic respiratory failure with hypoxia (HCC)  3. Current smoker  - Continue Trelegy ; Use Albuterol 2 puffs every 4-6 hours for shortness of breath  - Start nicotine inhaler as instructive  - Aim to walk on treadmill or 20 mins every Monday, Wednesday and Friday (increase actively as tolerates) - LDCT chest in March 2024 that showed no concerning lung nodules, emphysema - Due for follow-up CT chest in March 2025 - CONTINUE TO WORK ON SMOKING CESSATION, WE HAVE PICKED A QUIT DATE OF DECEMBER 28TH (AIM TO QUIT SMOKING BY JANUARY)   Orders: Ambulatory walk on Room air    Follow-up - 3 months with Beth NP/ Friday virtual clinic ok (COPD/ follow up on smoking cessation)   Glenford Bayley, NP 06/27/2023

## 2023-06-27 NOTE — Progress Notes (Signed)
Hct 49.9 today.  No phlebotomy per orders. Patient stated he feels good and no complaints voiced.

## 2023-07-07 ENCOUNTER — Ambulatory Visit (INDEPENDENT_AMBULATORY_CARE_PROVIDER_SITE_OTHER): Payer: 59 | Admitting: Orthopedic Surgery

## 2023-07-07 DIAGNOSIS — M21862 Other specified acquired deformities of left lower leg: Secondary | ICD-10-CM | POA: Diagnosis not present

## 2023-07-07 DIAGNOSIS — M25362 Other instability, left knee: Secondary | ICD-10-CM | POA: Diagnosis not present

## 2023-07-07 DIAGNOSIS — M1712 Unilateral primary osteoarthritis, left knee: Secondary | ICD-10-CM | POA: Diagnosis not present

## 2023-07-07 NOTE — Progress Notes (Signed)
CUSTOM KNEE BRACE patient has Instability with functional testing, Quad to calf ratio mismatch prevents off the shelf bracing

## 2023-07-07 NOTE — Progress Notes (Signed)
Follow-up visit  Chief Complaint  Patient presents with   Follow-up    Recheck on left knee    Encounter Diagnoses  Name Primary?   Hyperextension deformity of left knee Yes   Instability of left knee joint       Patient has instability in his left knee from over hyperextending  This is evident bilaterally provide on deformity and passively stretching left knee posteriorly or test  Patient does not fit the standard bracing calf and thigh also mismatch  Custom brace if possible  CUSTOM KNEE BRACE patient has Instability with functional testing, Quad to calf ratio mismatch prevents off the shelf bracing

## 2023-07-14 NOTE — Progress Notes (Deleted)
  Cardiology Office Note:  .   Date:  07/14/2023  ID:  Harold Bailey, DOB 08/31/1966, MRN [id number] PCP: Kara Pacer, NP   [facility] Providers Cardiologist:  Harold Haws, MD { Click to update primary MD,subspecialty MD or APP then REFRESH:1}   History of Present Illness: .   Harold Bailey is a 57 y.o. 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  Patient saw Dr. Eden Emms 12/2021 with 6 months chest pain. NST low risk prior MI. If recurrent chest pain discuss cath otherwise f/u in 1 yr.  ROS: ***  Studies Reviewed: Marland Kitchen         Prior CV Studies: {Select studies to display:26339}  ***  Risk Assessment/Calculations:   {Does this patient have ATRIAL FIBRILLATION?[phone]} No BP recorded.  {Refresh Note OR Click here to enter BP  :1}***       Physical Exam:   VS:  There were no vitals taken for this visit.   Wt Readings from Last 3 Encounters:  06/27/23 205 lb (93 kg)  05/12/23 202 lb (91.6 kg)  05/05/23 201 lb 11.5 oz (91.5 kg)    GEN: Well nourished, well developed in no acute distress NECK: No JVD; No carotid bruits CARDIAC: ***RRR, no murmurs, rubs, gallops RESPIRATORY:  Clear to auscultation without rales, wheezing or rhonchi  ABDOMEN: Soft, non-tender, non-distended EXTREMITIES:  No edema; No deformity   ASSESSMENT AND PLAN: .    History of chest pain-low risk NST 01/2022 but prior MI  HTN  History of palpitations  PVD  HLD  DM2       {Are you ordering a CV Procedure (e.g. stress test, cath, DCCV, TEE, etc)?   Press F2        :[id number]}  Dispo: ***  Signed, Jacolyn Reedy, PA-C

## 2023-07-21 ENCOUNTER — Telehealth: Payer: Self-pay | Admitting: Orthopedic Surgery

## 2023-07-21 ENCOUNTER — Telehealth: Payer: Self-pay | Admitting: Radiology

## 2023-07-21 NOTE — Telephone Encounter (Signed)
I will send her an email to get her phone number will let you know  She met him in office and gives patient her number

## 2023-07-21 NOTE — Telephone Encounter (Signed)
DR. Romeo Apple   Patient called LVM at 11:50 AM  wanting to know when is his brace for his ankle going to come in he states he was last seen last Monday and today makes a week and is it suppose to come to his house?

## 2023-07-21 NOTE — Telephone Encounter (Signed)
He needs to call [employer] she gave him her information

## 2023-07-21 NOTE — Telephone Encounter (Signed)
Medicare states  We need the name of the testing present that shows positive for his custom knee brace.  And we need a valid Medicare ICD10

## 2023-07-21 NOTE — Telephone Encounter (Signed)
I sent her email to get her number cc you in there carla

## 2023-07-21 NOTE — Telephone Encounter (Signed)
What does that mean?

## 2023-07-21 NOTE — Telephone Encounter (Signed)
[phone] is [employer] number

## 2023-07-22 NOTE — Telephone Encounter (Addendum)
Medicare will not cover brace unless you tell them what test you performed that shows instability of knee joint (varus/ valgus or stress)  They need a different diagnosis as well ( I added in OA knee since he does have OA and that will work   You used hyper extension deformity left knee  And instability left knee

## 2023-07-24 NOTE — Telephone Encounter (Signed)
New appt to retest

## 2023-07-28 ENCOUNTER — Encounter: Payer: Self-pay | Admitting: Physician Assistant

## 2023-07-28 ENCOUNTER — Ambulatory Visit: Payer: 59 | Attending: Physician Assistant | Admitting: Physician Assistant

## 2023-07-28 DIAGNOSIS — R002 Palpitations: Secondary | ICD-10-CM

## 2023-07-28 DIAGNOSIS — I739 Peripheral vascular disease, unspecified: Secondary | ICD-10-CM

## 2023-07-28 DIAGNOSIS — R079 Chest pain, unspecified: Secondary | ICD-10-CM

## 2023-07-28 DIAGNOSIS — E785 Hyperlipidemia, unspecified: Secondary | ICD-10-CM

## 2023-07-28 DIAGNOSIS — I10 Essential (primary) hypertension: Secondary | ICD-10-CM

## 2023-07-28 NOTE — Telephone Encounter (Signed)
I called to let him know we need more documentation to get him a knee brace and ask him to make appointment  Left message, he will call back to schedule.

## 2023-07-28 NOTE — Telephone Encounter (Signed)
To you FYI he will call back to schedule

## 2023-07-31 ENCOUNTER — Encounter: Payer: Self-pay | Admitting: Orthopedic Surgery

## 2023-07-31 ENCOUNTER — Ambulatory Visit: Payer: 59 | Admitting: Orthopedic Surgery

## 2023-07-31 VITALS — BP 132/90 | HR 123 | Ht 72.0 in | Wt 200.0 lb

## 2023-07-31 DIAGNOSIS — M1712 Unilateral primary osteoarthritis, left knee: Secondary | ICD-10-CM

## 2023-07-31 DIAGNOSIS — M21862 Other specified acquired deformities of left lower leg: Secondary | ICD-10-CM

## 2023-07-31 DIAGNOSIS — M25362 Other instability, left knee: Secondary | ICD-10-CM

## 2023-07-31 DIAGNOSIS — Z981 Arthrodesis status: Secondary | ICD-10-CM | POA: Diagnosis not present

## 2023-07-31 NOTE — Progress Notes (Signed)
Chief Complaint  Patient presents with   Knee Pain    Patient is here for a knee brace per insurance he needs one more visit.  He says he is having pain in the left knee and it locks up   Encounter Diagnoses  Name Primary?   Hyperextension deformity of left knee Yes   Instability of left knee joint    Unilateral primary osteoarthritis, left knee    S/P ankle fusion biltaeral     + tests  Ext recurvatum test  Anterior Drawer test   Brace needed for stability and fall prevention, and thigh calf circumf is a mismatch that prevents off the shelf bracing

## 2023-08-05 ENCOUNTER — Telehealth: Payer: Self-pay | Admitting: Orthopedic Surgery

## 2023-08-05 NOTE — Telephone Encounter (Signed)
Patient advised that Harold Bailey will be reaching out to him.

## 2023-08-05 NOTE — Telephone Encounter (Signed)
Dr. Mort Sawyers pt - spoke w/the pt, he stated his brace came in.  He wants to know if he needs a follow up appointment w/the brace.  I don't see anything on his chart indicating he needs one, but I wanted to confirm.

## 2023-08-05 NOTE — Telephone Encounter (Signed)
She will call him with a time, thanks.

## 2023-08-29 ENCOUNTER — Inpatient Hospital Stay: Payer: 59 | Attending: Hematology

## 2023-08-29 ENCOUNTER — Inpatient Hospital Stay: Payer: 59

## 2023-08-29 DIAGNOSIS — D751 Secondary polycythemia: Secondary | ICD-10-CM | POA: Insufficient documentation

## 2023-09-04 ENCOUNTER — Inpatient Hospital Stay: Payer: 59

## 2023-09-04 DIAGNOSIS — D751 Secondary polycythemia: Secondary | ICD-10-CM | POA: Diagnosis present

## 2023-09-04 LAB — CBC
HCT: 52.7 % — ABNORMAL HIGH (ref 39.0–52.0)
Hemoglobin: 16.9 g/dL (ref 13.0–17.0)
MCH: 30 pg (ref 26.0–34.0)
MCHC: 32.1 g/dL (ref 30.0–36.0)
MCV: 93.6 fL (ref 80.0–100.0)
Platelets: 212 10*3/uL (ref 150–400)
RBC: 5.63 MIL/uL (ref 4.22–5.81)
RDW: 14.7 % (ref 11.5–15.5)
WBC: 8.8 10*3/uL (ref 4.0–10.5)
nRBC: 0 % (ref 0.0–0.2)

## 2023-09-04 NOTE — Progress Notes (Signed)
Hct 52.7, pt is asymptomatic, pt.'s BP 101/78 HR 94. Mignon Pine NP notified. Per NP we can hold phlebotomy today if pt is asymptomatic. Pt agrees to plan. RN educated pt on the importance of notifying the clinic if he develops any complications or symptoms. Pt verbalized understanding and all questions answered at this time. VSS, pt stable at discharge. Pt discharge in ambulatory condition.  Montario Zilka [employer]

## 2023-09-25 NOTE — Progress Notes (Deleted)
 CARDIOLOGY CONSULT NOTE       Patient ID: Harold Bailey MRN: [id number] DOB/AGE: 1966/08/14 58 y.o.  Admit date: (Not on file) Referring Physician: Hope NP  Primary Physician: Benjamin Raina Elizabeth, NP Primary Cardiologist: Delford Reason for Consultation: Palpitations  Active Problems:   * No active hospital problems. *   HPI:  58 y.o. smoker with oxygen dependant COPD , HTN, GERD, seizures alcohol/drug use HLD P vera seen for palpitations September 2022 had prolonged hospitalization for MRSA requiring tracheostomy Says he quit drinking in February Use to smoke 4 ppd and down to < 1 ppd CT 12/06/21 with emphysema no cancer   Has PVD followed by Dr Gretta Has known distal SFA occlusion on right with 2 vessel run off No intervention done with biphasic wave forms and toe pressure 87 mmHg He was to f/u with Dr Harden for ankle issues post fusion  ABI 0.81 on left and 0.91 on right   His palpitations seem benign  Myovue done 02/20/22 low risk no ischemia ? Prior IMI but no RWMA and EF 60%  Since not having chest pain no cath entertained   He quit drinking again 11/14/21 He smokes < 1 ppd Activity limited by bilateral ankle fusions   ***  ROS All other systems reviewed and negative except as noted above  Past Medical History:  Diagnosis Date  . Anxiety   . Arthritis    Left ankle, hand  . COPD (chronic obstructive pulmonary disease) (HCC)   . COPD (chronic obstructive pulmonary disease) (HCC)   . Diabetes mellitus without complication (HCC)   . Drug abuse (HCC)   . DVT (deep venous thrombosis) (HCC)    Bilateral legs  . Dysrhythmia    SVT  . Gait disturbance   . GERD (gastroesophageal reflux disease)   . 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  SABRA History of ankle surgery   . History of brain tumor   . Hyperlipidemia   . Hypertension   . 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 Memory loss    . On home oxygen therapy 08/26/2018   at night while lying down  . Peripheral vision loss    Left due to brain surgery  . Pneumonia   . Polycythemia   . Polycythemia, secondary   . Seizures (HCC) 11/19/2013   none since 2016  . Shortness of breath    Hx of smoking.  SABRA SVT (supraventricular tachycardia) (HCC)   . Wears dentures   . Wears glasses     Family History  Problem Relation Age of Onset  . Hypertension Mother   . Bronchitis Mother   . Cancer Maternal Aunt   . Cancer Maternal Grandmother   . Healthy Sister   . Healthy Brother   . Stroke Neg Hx   . Diabetes Neg Hx     Social History   Socioeconomic History  . Marital status: Married    Spouse name: Vickie  . Number of children: 1  . Years of education: 49  . Highest education level: High school graduate  Occupational History    Employer: NOT EMPLOYED    Comment: Disabled  Tobacco Use  . Smoking status: Every Day    Current packs/day: 0.50    Average packs/day: 0.5 packs/day for 40.0 years (20.0 ttl pk-yrs)    Types: Cigarettes  . Smokeless tobacco: Never  . Tobacco comments:  1 pack every 2 days    HEI. 09/03/2022  Vaping Use  . Vaping status: Never Used  Substance and Sexual Activity  . Alcohol use: No    Alcohol/week: 0.0 standard drinks of alcohol    Comment: quit Oct. 2017(heavy drinker) .In AA  . Drug use: No  . Sexual activity: Not Currently  Other Topics Concern  . Not on file  Social History Narrative   Patient lives at home with his wife Harold Bailey)    Disabled   Left handed   Education 12 th    Caffeine Coffee four cups a day no tea no soda mostly water    Social Drivers of Corporate Investment Banker Strain: Not on file  Food Insecurity: Not on file  Transportation Needs: Not on file  Physical Activity: Not on file  Stress: Not on file  Social Connections: Not on file  Intimate Partner Violence: Not on file    Past Surgical History:  Procedure Laterality Date  . ABDOMINAL  AORTOGRAM W/LOWER EXTREMITY N/A 05/30/2017   Procedure: ABDOMINAL AORTOGRAM W/LOWER EXTREMITY;  Surgeon: Harvey Carlin BRAVO, MD;  Location: [facility];  Service: Cardiovascular;  Laterality: N/A;  . ABDOMINAL AORTOGRAM W/LOWER EXTREMITY N/A 07/12/2020   Procedure: ABDOMINAL AORTOGRAM W/LOWER EXTREMITY;  Surgeon: Gretta Lonni PARAS, MD;  Location: [facility];  Service: Cardiovascular;  Laterality: N/A;  . ANKLE ARTHROSCOPY  01/17/2012   Procedure: ANKLE ARTHROSCOPY;  Surgeon: Jerona LULLA Sage, MD;  Location: [facility] OR;  Service: Orthopedics;  Laterality: Left;  . ANKLE ARTHROSCOPY WITH FUSION Left 10/02/2016   Procedure: Left Posterior Subtalar Arthrodesis Arthroscopy;  Surgeon: Jerona LULLA Sage, MD;  Location: [facility];  Service: Orthopedics;  Laterality: Left;  . ANKLE FUSION Left 11/2012   Dr Sage  . ANKLE FUSION Right 08/09/2020   Procedure: RIGHT ANKLE FUSION;  Surgeon: Sage Jerona LULLA, MD;  Location: [facility] OR;  Service: Orthopedics;  Laterality: Right;  . ANKLE SURGERY  2003   Left  . BRAIN SURGERY  2009   Biopsy  . COLONOSCOPY WITH PROPOFOL  N/A 03/11/2019   Procedure: COLONOSCOPY WITH PROPOFOL ;  Surgeon: Shila Gustav LULLA, MD;  Location: [facility];  Service: Endoscopy;  Laterality: N/A;  . FRACTURE SURGERY  2003   Left ankle  . HARDWARE REMOVAL Left 12/08/2012   Procedure: HARDWARE REMOVAL;  Surgeon: Jerona LULLA Sage, MD;  Location: [facility] OR;  Service: Orthopedics;  Laterality: Left;  Removal Deep Hardware, Take Down Non-Union, Revision Internal Fixation Left Ankle  . I & D EXTREMITY Left 01/01/2013   Procedure: IRRIGATION AND DEBRIDEMENT EXTREMITY;  Surgeon: Jerona LULLA Sage, MD;  Location: [facility] OR;  Service: Orthopedics;  Laterality: Left;  Irrigation, Debridement and Placement Antibiotic Beads Left Ankle  . INNER EAR SURGERY    . OPEN REDUCTION INTERNAL FIXATION (ORIF) SCAPHOID WITH DISTAL RADIUS GRAFT Right 02/01/2013   Procedure: OPEN REDUCTION INTERNAL FIXATION (ORIF) RIGHT SCAPHOID FRACTURE  WITH DISTAL RADIUS GRAFT;  Surgeon: Donnice DELENA Robinsons, MD;  Location: [facility];  Service: Orthopedics;  Laterality: Right;  . ORIF ANKLE FRACTURE Left 12/08/2012   Procedure: OPEN REDUCTION INTERNAL FIXATION (ORIF) ANKLE FRACTURE;  Surgeon: Jerona LULLA Sage, MD;  Location: [facility];  Service: Orthopedics;  Laterality: Left;  Removal Deep Hardware, Take Down Non-Union, Revision Internal Fixation Left Ankle  . ORIF ANKLE FRACTURE Right 09/03/2013   Procedure: OPEN REDUCTION INTERNAL FIXATION (ORIF) ANKLE FRACTURE;  Surgeon: Jerona LULLA Sage, MD;  Location: [facility];  Service: Orthopedics;  Laterality:  Right;  Open Reduction Internal Fixation Right Fibula  . POLYPECTOMY  03/11/2019   Procedure: POLYPECTOMY;  Surgeon: Shila Gustav GAILS, MD;  Location: [facility];  Service: Endoscopy;;  . SCAPHOID FRACTURE SURGERY  02/04/2013  . SHOULDER ARTHROSCOPY  2008   Left  . TYMPANOSTOMY TUBE PLACEMENT  1993      Current Outpatient Medications:  .  albuterol  (PROVENTIL  HFA;VENTOLIN  HFA) 108 (90 Base) MCG/ACT inhaler, Inhale 2 puffs into the lungs every 6 (six) hours as needed for wheezing or shortness of breath., Disp: 1 Inhaler, Rfl: 6 .  allopurinol  (ZYLOPRIM ) 300 MG tablet, Take 300 mg by mouth daily., Disp: , Rfl:  .  aspirin  EC 81 MG tablet, Take 81 mg by mouth daily., Disp: , Rfl:  .  Fluticasone -Umeclidin-Vilant (TRELEGY ELLIPTA ) 100-62.5-25 MCG/ACT AEPB, Inhale 1 puff into the lungs daily., Disp: 60 each, Rfl: 1 .  gabapentin  (NEURONTIN ) 300 MG capsule, Take 300 mg by mouth daily., Disp: , Rfl:  .  hydrOXYzine (ATARAX) 50 MG tablet, Take 50 mg by mouth 2 (two) times daily as needed., Disp: , Rfl:  .  levETIRAcetam  (KEPPRA ) 500 MG tablet, TAKE 1 TABLET(500 MG) BY MOUTH TWICE DAILY, Disp: 60 tablet, Rfl: 3 .  meclizine (ANTIVERT) 25 MG tablet, Take 25 mg by mouth 3 (three) times daily as needed., Disp: , Rfl:  .  meloxicam  (MOBIC ) 7.5 MG tablet, TAKE 1 TABLET(7.5 MG) BY MOUTH DAILY, Disp: 90 tablet, Rfl: 0 .   metFORMIN  (GLUCOPHAGE ) 500 MG tablet, Take 500 mg by mouth 2 (two) times daily., Disp: , Rfl:  .  metoprolol  succinate (TOPROL -XL) 100 MG 24 hr tablet, Take 100 mg by mouth daily. Take with or immediately following a meal., Disp: , Rfl:  .  Multiple Vitamins-Minerals (MULTIVITAMIN WITH MINERALS) tablet, Take 2 tablets by mouth daily. , Disp: , Rfl:  .  naloxone (NARCAN) nasal spray 4 mg/0.1 mL, , Disp: , Rfl:  .  Naphazoline-Pheniramine (OPCON-A ) 0.027-0.315 % SOLN, Place 1 drop into both eyes every 8 (eight) hours as needed (red, irritated eyes)., Disp: , Rfl:  .  neomycin-polymyxin-hydrocortisone (CORTISPORIN) 3.5-10000-1 OTIC suspension, Place 2 drops into the left ear 2 (two) times daily as needed (ear pain). (Patient not taking: Reported on 07/31/2023), Disp: , Rfl:  .  nicotine  (NICOTROL ) 10 MG inhaler, Inhale 1 Cartridge (1 continuous puffing total) into the lungs as needed for smoking cessation. (Patient not taking: Reported on 07/31/2023), Disp: 42 each, Rfl: 0 .  Oxycodone  HCl 10 MG TABS, Take 10 mg by mouth 4 (four) times daily as needed., Disp: , Rfl:  .  oxyCODONE -acetaminophen  (PERCOCET/ROXICET) 5-325 MG tablet, Take 1 tablet by mouth every 4 (four) hours as needed., Disp: 30 tablet, Rfl: 0 .  OXYGEN, Inhale into the lungs. At home oxygen, Disp: , Rfl:  .  pantoprazole  (PROTONIX ) 40 MG tablet, Take 40 mg by mouth daily., Disp: , Rfl:  .  polyethylene glycol (MIRALAX  / GLYCOLAX ) 17 g packet, Take 17 g by mouth daily., Disp: 14 each, Rfl: 0 .  rosuvastatin  (CRESTOR ) 20 MG tablet, Take 20 mg by mouth daily., Disp: , Rfl:  .  tamsulosin (FLOMAX) 0.4 MG CAPS capsule, Take 0.4 mg by mouth daily., Disp: , Rfl:  .  topiramate  (TOPAMAX ) 25 MG tablet, Take 25 mg by mouth 2 (two) times daily., Disp: , Rfl:  .  traZODone  (DESYREL ) 100 MG tablet, Take 200 mg by mouth at bedtime., Disp: , Rfl:  .  VITAMIN D  PO, Take 1 capsule  by mouth daily., Disp: , Rfl:  .  VOLTAREN  1 % GEL, APPLY 2 TO 4 GRAMS  EXTERNALLY TO THE AFFECTED AREA TWICE DAILY (Patient taking differently: Apply 4 g topically 4 (four) times daily as needed (pain).), Disp: 500 g, Rfl: 0 .  zolpidem (AMBIEN) 5 MG tablet, Take 5 mg by mouth at bedtime as needed., Disp: , Rfl:     Physical Exam: There were no vitals taken for this visit.   Chronically ill male Previous trach Oxygen with emphysema exp wheezing  Distant heart sounds Abdomen benign Trace LE edema  Decreased DP/PT RLE   Labs:   Lab Results  Component Value Date   WBC 8.8 09/04/2023   HGB 16.9 09/04/2023   HCT 52.7 (H) 09/04/2023   MCV 93.6 09/04/2023   PLT 212 09/04/2023   No results for input(s): NA, K, CL, CO2, BUN, CREATININE, CALCIUM , PROT, BILITOT, ALKPHOS, ALT, AST, GLUCOSE in the last 168 hours.  Invalid input(s): LABALBU Lab Results  Component Value Date   TROPONINI <0.03 01/15/2015    Lab Results  Component Value Date   CHOL 153 04/25/2020   CHOL 134 02/01/2020   CHOL 159 11/22/2013   Lab Results  Component Value Date   HDL 33 (L) 04/25/2020   HDL 37 (L) 02/01/2020   HDL 29 (L) 11/22/2013   Lab Results  Component Value Date   LDLCALC 47 04/25/2020   LDLCALC 37 02/01/2020   LDLCALC 86 11/22/2013   Lab Results  Component Value Date   TRIG 364 (H) 04/25/2020   TRIG 300 (H) 02/01/2020   TRIG 219 (H) 05/24/2017   Lab Results  Component Value Date   CHOLHDL 4.6 04/25/2020   CHOLHDL 3.6 02/01/2020   CHOLHDL 5.5 11/22/2013   No results found for: LDLDIRECT    Radiology: No results found.  EKG: SR rate 92 normal 11/15/21    ASSESSMENT AND PLAN:   Palpitations: benign sounding Given lung dx at risk for MAT  Continue Toprol   Pulmonary: oxygen dependant 4L's CT negative cancer F/U Lauraine Lites PVD:  f/u Dr Gretta known right SFA occlusion biphasic pulses and mildly decreased ABI's  CAD:  low risk myovue 01/2022 and normal EF no ischemia  Seizures:  continue Keppra  f/u neurology  DM:  Discussed low carb diet.  Target hemoglobin A1c is 6.5 or less.  Continue current medications. HLD:  continue statin  P-vera:  related to smoking wearing oxygen Hct as high as 61 a year ago most recent down to 52.7 has had phlebotomies  Ortho :  some left knee instability and prior bilateral ankle fusions F/U Dr Harrisoin.    F/U Dr Gretta [facility] F/U cardiology in a year   Signed: Maude Emmer 09/25/2023, 8:51 AM

## 2023-09-26 ENCOUNTER — Telehealth: Payer: 59 | Admitting: Primary Care

## 2023-09-26 ENCOUNTER — Encounter: Payer: Self-pay | Admitting: Primary Care

## 2023-10-06 ENCOUNTER — Encounter: Payer: Self-pay | Admitting: Hematology

## 2023-10-07 ENCOUNTER — Ambulatory Visit: Payer: 59 | Admitting: Cardiovascular Disease

## 2023-10-31 ENCOUNTER — Inpatient Hospital Stay: Payer: 59

## 2023-11-06 ENCOUNTER — Inpatient Hospital Stay: Payer: 59 | Attending: Hematology

## 2023-11-06 ENCOUNTER — Inpatient Hospital Stay: Payer: 59

## 2023-11-06 DIAGNOSIS — D751 Secondary polycythemia: Secondary | ICD-10-CM | POA: Diagnosis present

## 2023-11-06 LAB — CBC
HCT: 57.4 % — ABNORMAL HIGH (ref 39.0–52.0)
Hemoglobin: 18 g/dL — ABNORMAL HIGH (ref 13.0–17.0)
MCH: 29.3 pg (ref 26.0–34.0)
MCHC: 31.4 g/dL (ref 30.0–36.0)
MCV: 93.5 fL (ref 80.0–100.0)
Platelets: 180 10*3/uL (ref 150–400)
RBC: 6.14 MIL/uL — ABNORMAL HIGH (ref 4.22–5.81)
RDW: 14.3 % (ref 11.5–15.5)
WBC: 8.9 10*3/uL (ref 4.0–10.5)
nRBC: 0 % (ref 0.0–0.2)

## 2023-11-06 NOTE — Progress Notes (Signed)
Pt.'s HCT 57.4 and BP pre phlebotomy 101/78. Mignon Pine NP notified. Per Boneta Lucks, NP to proceed with phlebotomy with pulling off .   Harold Bailey presents today for phlebotomy per MD orders. Phlebotomy procedure started at 1500 and ended at 1505. 250 cc removed. Patient tolerated procedure well. IV needle removed intact. Pt refused to wait 30 minutes post phlebotomy and refused drinks/snacks that was offered. Per pt he was ready to be discharged. Vitals stable. Pt stable at discharge.   Harold Bailey [employer]

## 2023-11-06 NOTE — Patient Instructions (Signed)

## 2023-11-07 NOTE — Progress Notes (Signed)
VIRTUAL VISIT via TELEPHONE NOTE [facility]   I connected with Harold Bailey  on 11/10/23 at  8:30 AM by telephone and verified that I am speaking with the correct person using two identifiers.  Location: Patient: Home Provider: [facility]   I discussed the limitations, risks, security and privacy concerns of performing an evaluation and management service by telephone and the availability of in person appointments. I also discussed with the patient that there may be a patient responsible charge related to this service. The patient expressed understanding and agreed to proceed.  REASON FOR VISIT:  Follow-up for secondary polycythemia   CURRENT THERAPY: Intermittent phlebotomy + aspirin 81 mg daily  INTERVAL HISTORY:  Harold Bailey is contacted today for follow-up of  secondary polycythemia.  He was last seen by Rojelio Brenner PA-C on 05/05/2023.  Since his last visit, he has been scheduled for as needed phlebotomy every 2 months.  No phlebotomy was needed on 06/27/2023, and phlebotomy on 09/04/2023 was held due to patient being asymptomatic.  Half unit phlebotomy performed on 11/06/2023.  At today's visit, he reports feeling fair. He feels slightly improved energy and decreased headaches after phlebotomies.  (He continues to follow with neurology for chronic migraines.)   He continues to have unchanged tinnitus, dizziness, intermittent blurry vision, and nocturnal neuropathy.  He has occasional reddish discoloration of his feet, but this is improved from before. He denies any aquagenic pruritus or B symptoms.  He is compliant with nighttime oxygen.  He continues to smoke 1 PPD cigarettes.  He has 25% energy and 100% appetite. He endorses that he is maintaining a stable weight.  ASSESSMENT & PLAN:  1.   Erythrocytosis - Patient seen at the request of his PCP (FNP Marylynn Pearson of the [facility]) for workup and management of suspected  secondary polycythemia. - Intermittently elevated hemoglobin/hematocrit since at least 2013, with maximum Hgb 21.8/HCT 64.0 on 07/12/2020. - Current everyday cigarette smoker (0.5-1.0 PPD), previously smoked up to 4 PPD - No diuretics or testosterone supplements. - PMH significant for COPD.  Sleep study in February 2024 did not show any significant OSA, but showed severe oxygen desaturation, requiring 2 L supplemental oxygen during sleep.   - Denies any obvious carbon monoxide exposure, but does use woodstove at home (split-level house); reports carbon monoxide monitor at home. - Denies any family history of polycythemia or myeloproliferative neoplasm. - History of left upper extremity and left lower extremity DVTs in September 2018 (see below) - Hematology workup (09/24/2022): Mutational analysis NEGATIVE for JAK2, CALR, MPL, exons 12-15 Erythropoietin mildly elevated at 20.4 Carbon monoxide elevated at 16.4, above upper limit of normal for smokers - He receives intermittent phlebotomy (most recently 11/06/2023) due to high symptom burden and HCT >55.0 - Reports symptoms somewhat improved after phlebotomy.  Continues to have daily headaches, tinnitus, dizziness, blurry vision, reddish discoloration of his feet.  No aquagenic pruritus or B symptoms. - Most recent labs (11/06/2023): Hemoglobin 18.0/hematocrit 57.4. - DIFFERENTIAL DIAGNOSIS: Erythrocytosis most likely related to cigarette smoking and COPD. - PLAN: Patient will likely continue to benefit from therapeutic phlebotomy to decrease symptom burden and decrease risk of thrombotic events by keeping HCT <52-54.0. -- CBC + phlebotomy every 3 months (if HCT >52.0) - Same-day labs + OFFICE visit + phlebotomy in 1 year  -- Continue aspirin 81 mg daily - Continued to encourage smoking cessation.  Continue nocturnal oxygen per pulmonologist.     2.  History of DVT, provoked - Vascular US upper extremity (05/22/2017): Findings consistent with  subacute deep vein thrombosis involving the subclavian, axillary, and brachial veins of left upper extremity - Vascular US lower extremity ARTERIAL duplex (05/25/2017): Arterial occlusion in distal femoral artery with collateral flow noted; incidentally, there is acute nonocclusive DVT in left popliteal vein. - DVTs occurred during 3-week hospitalization for MRSA pneumonia - Patient was treated with Eliquis x 6 months, discontinued per vascular/vein specialist (Dr. Lemar Livings) - Patient continues on aspirin 81 mg daily per recommendation of vascular surgeon   3.  Tobacco use -  Reports chronic cigarette smoking x 40 years, previously smoked 1 PPD to 4 PPD cigarettes, but has cut back to 0.5 PPD cigarettes for the past 6 months. - Need for LDCT scan has been addressed by his pulmonology office, last performed in March 2024 (lung RADS 1 negative) - PLAN: Patient reports that he wants to quit smoking.  MyChart message sent to patient with information regarding [facility]uit [facility] program.  Patient advised to speak with his pulmonologist and PCP regarding medications that may assist with smoking cessation.   4.  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  Lives at home with his wife and son. - TOBACCO: Current smoker, 0.5 PPD cigarettes - ALCOHOL: Drinks approximately 80 ounces of beer daily.  Previously drank 12+ beers daily. - DRUGS: Patient denies illicit drug use. - FAMILY: Patient's father had colorectal cancer.  No family history of myeloproliferative neoplasm.   PLAN SUMMARY: >> CBC + possible phlebotomy every 3 months (if HCT > 52.0) >> Same-day labs (CBC/D) + OFFICE visit + phlebotomy in 1 year  ** Last office visit 05/05/23     REVIEW OF SYSTEMS:    Review of Systems  Constitutional:  Negative for chills, diaphoresis, fever, malaise/fatigue and weight loss.  HENT:  Positive for tinnitus.   Respiratory:  Positive for shortness of breath. Negative for cough.   Cardiovascular:  Positive for palpitations. Negative for chest pain.  Gastrointestinal:  Positive for nausea. Negative for abdominal pain, blood in stool, melena and vomiting.  Musculoskeletal:  Positive for joint pain.  Neurological:  Positive for dizziness and headaches.  Psychiatric/Behavioral:  The patient has insomnia.      PHYSICAL EXAM: (per limitations of virtual telephone visit)  The patient is alert and oriented x 3, exhibiting adequate mentation, good mood, and ability to speak in full sentences and execute sound judgement.  WRAP UP:   I discussed the assessment and treatment plan with the patient. The patient was provided an opportunity to ask questions and all were answered. The patient agreed with the plan and demonstrated an understanding of the instructions.   The patient was advised to call back or seek an in-person evaluation if the symptoms worsen or if the condition fails to improve as anticipated.  I provided 24 minutes of non-face-to-face time during this encounter.  Carnella Guadalajara, PA-C 11/10/23 8:54 AM

## 2023-11-10 ENCOUNTER — Encounter: Payer: Self-pay | Admitting: Physician Assistant

## 2023-11-10 ENCOUNTER — Inpatient Hospital Stay: Payer: 59 | Admitting: Physician Assistant

## 2023-11-10 DIAGNOSIS — D751 Secondary polycythemia: Secondary | ICD-10-CM

## 2023-11-18 ENCOUNTER — Ambulatory Visit: Payer: 59

## 2023-11-18 ENCOUNTER — Encounter: Payer: Self-pay | Admitting: Physician Assistant

## 2023-11-18 ENCOUNTER — Other Ambulatory Visit (HOSPITAL_COMMUNITY)
Admission: RE | Admit: 2023-11-18 | Discharge: 2023-11-18 | Disposition: A | Discharge status: Home / Self Care | Payer: 59 | Source: Ambulatory Visit | Attending: Physician Assistant | Admitting: Physician Assistant

## 2023-11-18 ENCOUNTER — Ambulatory Visit: Payer: 59 | Attending: Physician Assistant | Admitting: Physician Assistant

## 2023-11-18 VITALS — BP 122/70 | HR 70 | Ht 72.0 in | Wt 214.2 lb

## 2023-11-18 DIAGNOSIS — I10 Essential (primary) hypertension: Secondary | ICD-10-CM | POA: Diagnosis not present

## 2023-11-18 DIAGNOSIS — Z720 Tobacco use: Secondary | ICD-10-CM

## 2023-11-18 DIAGNOSIS — I471 Supraventricular tachycardia, unspecified: Secondary | ICD-10-CM | POA: Insufficient documentation

## 2023-11-18 DIAGNOSIS — R002 Palpitations: Secondary | ICD-10-CM

## 2023-11-18 DIAGNOSIS — I2510 Atherosclerotic heart disease of native coronary artery without angina pectoris: Secondary | ICD-10-CM | POA: Diagnosis not present

## 2023-11-18 DIAGNOSIS — R9439 Abnormal result of other cardiovascular function study: Secondary | ICD-10-CM | POA: Diagnosis not present

## 2023-11-18 DIAGNOSIS — E785 Hyperlipidemia, unspecified: Secondary | ICD-10-CM

## 2023-11-18 LAB — COMPREHENSIVE METABOLIC PANEL
ALT: 26 U/L (ref 0–44)
AST: 20 U/L (ref 15–41)
Albumin: 3.8 g/dL (ref 3.5–5.0)
Alkaline Phosphatase: 66 U/L (ref 38–126)
Anion gap: 10 (ref 5–15)
BUN: 21 mg/dL — ABNORMAL HIGH (ref 6–20)
CO2: 25 mmol/L (ref 22–32)
Calcium: 9.1 mg/dL (ref 8.9–10.3)
Chloride: 103 mmol/L (ref 98–111)
Creatinine, Ser: 0.72 mg/dL (ref 0.61–1.24)
GFR, Estimated: >60 mL/min (ref >60)
Glucose, Bld: 102 mg/dL — ABNORMAL HIGH (ref 70–99)
Potassium: 4.1 mmol/L (ref 3.5–5.1)
Sodium: 138 mmol/L (ref 135–145)
Total Bilirubin: 0.4 mg/dL (ref 0.0–1.2)
Total Protein: 7.1 g/dL (ref 6.5–8.1)

## 2023-11-18 LAB — MAGNESIUM: Magnesium: 2.1 mg/dL (ref 1.7–2.4)

## 2023-11-18 LAB — TSH: TSH: 1.444 u[IU]/mL (ref 0.350–4.500)

## 2023-11-18 NOTE — Patient Instructions (Addendum)
 Medication Instructions:  Your physician recommends that you continue on your current medications as directed. Please refer to the Current Medication list given to you today.  PLEASE HAVE SPOUSE CALL TO UPDATE MEDICATION LIST  *If you need a refill on your cardiac medications before your next appointment, please call your pharmacy*   Lab Work: Today: CMET MAG TSH  If you have labs (blood work) drawn today and your tests are completely normal, you will receive your results only by: MyChart Message (if you have MyChart) OR A paper copy in the mail If you have any lab test that is abnormal or we need to change your treatment, we will call you to review the results.   Testing/Procedures: Your physician has requested that you have an echocardiogram. Echocardiography is a painless test that uses sound waves to create images of your heart. It provides your doctor with information about the size and shape of your heart and how well your heart's chambers and valves are working. This procedure takes approximately one hour. There are no restrictions for this procedure. Please do NOT wear cologne, perfume, aftershave, or lotions (deodorant is allowed). Please arrive 15 minutes prior to your appointment time.  Please note: We ask at that you not bring children with you during ultrasound (echo/ vascular) testing. Due to room size and safety concerns, children are not allowed in the ultrasound rooms during exams. Our front office staff cannot provide observation of children in our lobby area while testing is being conducted. An adult accompanying a patient to their appointment will only be allowed in the ultrasound room at the discretion of the ultrasound technician under special circumstances. We apologize for any inconvenience.    Follow-Up: At [facility], you and your health needs are our priority.  As part of our continuing mission to provide you with exceptional heart care, we have  created designated Provider Care Teams.  These Care Teams include your primary Cardiologist (physician) and Advanced Practice Providers (APPs -  Physician Assistants and Nurse Practitioners) who all work together to provide you with the care you need, when you need it.  We recommend signing up for the patient portal called "MyChart".  Sign up information is provided on this After Visit Summary.  MyChart is used to connect with patients for Virtual Visits (Telemedicine).  Patients are able to view lab/test results, encounter notes, upcoming appointments, etc.  Non-urgent messages can be sent to your provider as well.   To learn more about what you can do with MyChart, go to [email or web address].    Your next appointment:   3 month(s)  Provider:   You will see one of the following Advanced Practice Providers on your designated Care Team:   Ronie Spies, PA-C    Please make follow up appointment with Vascular Surgery.   Other Instructions ZIO XT- Long Term Monitor Instructions   Your physician has requested you wear your ZIO patch monitor_______days.   This is a single patch monitor.  [employer] supplies one patch monitor per enrollment.  Additional stickers are not available.   Please do not apply patch if you will be having a Nuclear Stress Test, Echocardiogram, Cardiac CT, MRI, or Chest Xray during the time frame you would be wearing the monitor. The patch cannot be worn during these tests.  You cannot remove and re-apply the ZIO XT patch monitor.   Your ZIO patch monitor will be sent USPS Priority mail from [facility] directly to your home address. The monitor  may also be mailed to a PO BOX if home delivery is not available.   It may take 3-5 days to receive your monitor after you have been enrolled.   Once you have received you monitor, please review enclosed instructions.  Your monitor has already been registered assigning a specific monitor serial # to you.   Applying the  monitor   Shave hair from upper left chest.   Hold abrader disc by orange tab.  Rub abrader in 40 strokes over left upper chest as indicated in your monitor instructions.   Clean area with 4 enclosed alcohol pads .  Use all pads to assure are is cleaned thoroughly.  Let dry.   Apply patch as indicated in monitor instructions.  Patch will be place under collarbone on left side of chest with arrow pointing upward.   Rub patch adhesive wings for 2 minutes.Remove white label marked "1".  Remove white label marked "2".  Rub patch adhesive wings for 2 additional minutes.   While looking in a mirror, press and release button in center of patch.  A small green light will flash 3-4 times .  This will be your only indicator the monitor has been turned on.     Do not shower for the first 24 hours.  You may shower after the first 24 hours.   Press button if you feel a symptom. You will hear a small click.  Record Date, Time and Symptom in the Patient Log Book.   When you are ready to remove patch, follow instructions on last 2 pages of Patient Log Book.  Stick patch monitor onto last page of Patient Log Book.   Place Patient Log Book in [place] box.  Use locking tab on box and tape box closed securely.  The [employer] and [employer] has [employer] on it.  Please place in mailbox as soon as possible.  Your physician should have your test results approximately 7 days after the monitor has been mailed back to [facility].   Call [facility] Customer Care at [phone] if you have questions regarding your ZIO XT patch monitor.  Call them immediately if you see an orange light blinking on your monitor.   If your monitor falls off in less than 4 days contact our Monitor department at [phone].  If your monitor becomes loose or falls off after 4 days call [employer] at [phone] for suggestions on securing your monitor.

## 2023-11-18 NOTE — Progress Notes (Signed)
 Cardiology Office Note    Date:  11/18/2023  ID:  YIDA HYAMS, DOB 1966/03/18, MRN [id number] PCP:  Kara Pacer, NP  Cardiologist:  Charlton Haws, MD  Electrophysiologist:  None   Chief Complaint: palpitations  History of Present Illness: .    ALDRIN ENGELHARD is a 58 y.o. 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 erythrocytosis and elevated CO level followed by hematology, prior DVTs, PAD previously managed by vascular surgery, SVT, COPD, GERD, HTN, HLD, aortic atherosclerosis, memory deficit following brain tumor excision, seizures, anxiety, arthritis, tobacco abuse seen for follow-up.  In 2018 he had a complex admission for MRSA tracheobronchitis complicated by LUE DVT, LLE DVT, and occlusion of the distal L SFA. He was treated with Eliquis for his VTE for 6 months, stopped at the direction of Dr. Randie Heinz. He underwent PV angio and was recommended to consider L femoral-PT bypass but this was deferred until he was stronger. He never did have this done from what I can see. He underwent repeat PV angio in 2021 with R and L distal SFA occlusion managed medically. He saw Dr. Eden Emms in 12/2021 for chest pain and palpitations. Palpitations were felt benign sounding. He does carry history of SVT. Nuc 01/2022 showed evidence for prior inferior MI but no active ischemia, EF 60%. Dr. Eden Emms did not feel workup needed unless having more chest pain. I was able to review older records in [facility] today; he had had a cath for similarly abnormal stress test at [facility] in 04/2017 showing nonobstructive CAD -20% LAD, 40-50% oLCx, 20% mid-prox RCA, EF normal.  He is seen for follow-up today reporting he has been doing well except the last few months has had occasional episodes of palpitations described as rapid HR. They can last minutes up to an hour, without clear trigger or relieving factor. They actually have not happened in the last 4 weeks though. He has  chronic unchanged dyspnea which he attributes to his smoking. He denies any chest pain or syncope. He has a lengthy historical med list but is unsure what he is actually taking. He will have his wife call with updated list.   Labwork independently reviewed: 10/2023 Hgb 18, plt 180 04/2023 Hgb 16.5, plt 213 08/2022 DXA Hgb 18.9, Cr 0.74, K 4.1, LFTs ok, LDL 75 2022 NA 134, K 5.0, Cr 0.84, LFTs ok 2021 LDL 47, trig 364 by PCP, TSH wnl   ROS: .    Please see the history of present illness.  All other systems are reviewed and otherwise negative.  Studies Reviewed: Marland Kitchen    EKG:  EKG is ordered today, personally reviewed, demonstrating:   EKG Interpretation Date/Time:  Tuesday November 18 2023 14:17:54 EST Ventricular Rate:  70 PR Interval:  168 QRS Duration:  92 QT Interval:  400 QTC Calculation: 432 R Axis:   63  Text Interpretation: Normal sinus rhythm Normal ECG When compared with ECG of 15-Nov-2021 14:25, No significant change was found Confirmed by Ronie Spies [phone]) on 11/18/2023 2:43:21 PM   CV Studies: Cardiac studies reviewed are outlined and summarized above. Otherwise please see EMR for full report.   Current Reported Medications:.    PATIENT UNSURE: HE WILL HAVE WIFE CALL WITH LIST  Current Meds  Medication Sig   albuterol (PROVENTIL HFA;VENTOLIN HFA) 108 (90 Base) MCG/ACT inhaler Inhale 2 puffs into the lungs every 6 (six) hours as needed for wheezing or shortness of breath.   allopurinol (ZYLOPRIM) 300 MG  tablet Take 300 mg by mouth daily.   aspirin EC 81 MG tablet Take 81 mg by mouth daily.   EMGALITY 120 MG/ML SOSY    fluticasone (FLONASE) 50 MCG/ACT nasal spray Place 2 sprays into both nostrils daily.   Fluticasone-Umeclidin-Vilant (TRELEGY ELLIPTA) 100-62.5-25 MCG/ACT AEPB Inhale 1 puff into the lungs daily.   gabapentin (NEURONTIN) 300 MG capsule Take 300 mg by mouth daily.   hydrOXYzine (ATARAX) 50 MG tablet Take 50 mg by mouth 2 (two) times daily as needed.    levETIRAcetam (KEPPRA) 500 MG tablet TAKE 1 TABLET(500 MG) BY MOUTH TWICE DAILY   meclizine (ANTIVERT) 25 MG tablet Take 25 mg by mouth 3 (three) times daily as needed.   meloxicam (MOBIC) 7.5 MG tablet TAKE 1 TABLET(7.5 MG) BY MOUTH DAILY   metFORMIN (GLUCOPHAGE) 500 MG tablet Take 500 mg by mouth 2 (two) times daily.   metoprolol succinate (TOPROL-XL) 100 MG 24 hr tablet Take 100 mg by mouth daily. Take with or immediately following a meal.   mirabegron ER (MYRBETRIQ) 25 MG TB24 tablet Take 25 mg by mouth daily.   Multiple Vitamins-Minerals (MULTIVITAMIN WITH MINERALS) tablet Take 2 tablets by mouth daily.    naloxone (NARCAN) nasal spray 4 mg/0.1 mL    Naphazoline-Pheniramine (OPCON-A) 0.027-0.315 % SOLN Place 1 drop into both eyes every 8 (eight) hours as needed (red, irritated eyes).   neomycin-polymyxin-hydrocortisone (CORTISPORIN) 3.5-10000-1 OTIC suspension Place 2 drops into the left ear 2 (two) times daily as needed (ear pain).   nicotine (NICOTROL) 10 MG inhaler Inhale 1 Cartridge (1 continuous puffing total) into the lungs as needed for smoking cessation.   Oxycodone HCl 10 MG TABS Take 10 mg by mouth 4 (four) times daily as needed.   oxyCODONE-acetaminophen (PERCOCET/ROXICET) 5-325 MG tablet Take 1 tablet by mouth every 4 (four) hours as needed.   OXYGEN Inhale into the lungs. At home oxygen   pantoprazole (PROTONIX) 40 MG tablet Take 40 mg by mouth daily.   polyethylene glycol (MIRALAX / GLYCOLAX) 17 g packet Take 17 g by mouth daily.   rosuvastatin (CRESTOR) 20 MG tablet Take 20 mg by mouth daily.   sildenafil (VIAGRA) 25 MG tablet Take 25 mg by mouth daily as needed.   tamsulosin (FLOMAX) 0.4 MG CAPS capsule Take 0.4 mg by mouth daily.   tiZANidine (ZANAFLEX) 4 MG tablet Take 4 mg by mouth every 8 (eight) hours as needed.   topiramate (TOPAMAX) 25 MG tablet Take 25 mg by mouth 2 (two) times daily.   traZODone (DESYREL) 100 MG tablet Take 200 mg by mouth at bedtime.   VITAMIN D  PO Take 1 capsule by mouth daily.   VOLTAREN 1 % GEL APPLY 2 TO 4 GRAMS EXTERNALLY TO THE AFFECTED AREA TWICE DAILY (Patient taking differently: Apply 4 g topically 4 (four) times daily as needed (pain).)   zolpidem (AMBIEN) 5 MG tablet Take 5 mg by mouth at bedtime as needed.    Physical Exam:    VS:  BP 122/70   Pulse 70   Ht 6' (1.829 m)   Wt 214 lb 3.2 oz (97.2 kg)   SpO2 90%   BMI 29.05 kg/m    Wt Readings from Last 3 Encounters:  11/18/23 214 lb 3.2 oz (97.2 kg)  07/31/23 200 lb (90.7 kg)  06/27/23 205 lb (93 kg)    GEN: Well nourished, well developed in no acute distress NECK: No JVD; No carotid bruits CARDIAC: RRR, no murmurs, rubs, gallops RESPIRATORY:  Mildly diminished throughout without wheezing, rales or rhonchi.  ABDOMEN: Soft, non-tender, non-distended EXTREMITIES:  No edema; No acute deformity   Asessement and Plan:.    Plan at this time is impacted by unknown home medication list at this time. Patient will have his wife call us with this information.  1. Palpitations, h/o SVT - question whether this represents recurrent SVT or other arrhythmia. Will proceed with 14 day nonlive Zio for evaluation. Check basic labs including CMET, Mg, TSH today. He had recent CBC obtained by outside office. Will also await home med list. Consideration could be given to increasing metoprolol in follow-up.  2. Nonobstructive CAD 2018, h/o abnormal stress test - no progressive symptoms. Awaiting home med list as above. Would benefit from ASA and statin therapy. Check lipids when he returns for echo.  3. Essential HTN - controlled, no changes yet made at this time.  4. HLD - overdue for recheck, goal LDL<70. Will obtain fasting lipid profile when he returns for his echo since already ate today.  5. Tobacco abuse - patient requests trial of nicotine gum. Patches didn't work and Chantix made him smoke more. Await home med list review before prescribing. Patient is also being followed by  the low dose CT scanning program.     Disposition: F/u with me in May after testing. Otherwise encouraged overdue f/u VVS for PAD.  Signed, Laurann Montana, PA-C

## 2023-11-26 ENCOUNTER — Telehealth: Payer: Self-pay

## 2023-11-26 NOTE — Telephone Encounter (Signed)
-----   Message from Laurann Montana sent at 11/18/2023  4:19 PM EST ----- Patient was going to call us back with updated med list after conferring with wife. When they call back would let them know labs are stable. Please send me phone note when med list is updated so that I can document further via that type of note. Can call these results to them tomorrow afternoon if we hadn't heard from them. Thank you!

## 2023-11-26 NOTE — Telephone Encounter (Signed)
 Medication list updated- will route to provider.

## 2023-11-28 ENCOUNTER — Telehealth: Payer: Self-pay | Admitting: Cardiovascular Disease

## 2023-11-28 NOTE — Telephone Encounter (Signed)
 Per encounter note with D. Dunn, PA pt is to wear non-live Zio monitor for 14 days. Pt notified and voiced understanding.

## 2023-11-28 NOTE — Telephone Encounter (Signed)
 Patient wants a call back to confirm how long he should be wearing his Zio heart monitor.

## 2023-12-02 ENCOUNTER — Encounter: Payer: Self-pay | Admitting: Cardiovascular Disease

## 2023-12-02 NOTE — Telephone Encounter (Signed)
 Error

## 2023-12-04 ENCOUNTER — Ambulatory Visit (HOSPITAL_COMMUNITY): Admission: RE | Admit: 2023-12-04 | Payer: 59 | Source: Ambulatory Visit

## 2023-12-08 ENCOUNTER — Telehealth: Payer: Self-pay | Admitting: *Deleted

## 2023-12-08 ENCOUNTER — Ambulatory Visit (HOSPITAL_COMMUNITY): Payer: 59

## 2023-12-08 NOTE — Telephone Encounter (Signed)
 Spoke with patient. CT rescheduled for 12/16/2023 at 2:30pm at [facility].

## 2023-12-08 NOTE — Telephone Encounter (Signed)
 PT needs to resched CT. States he is on medicine that is disorientating. His # is [phone]

## 2023-12-10 ENCOUNTER — Telehealth: Payer: Self-pay

## 2023-12-10 MED ORDER — METOPROLOL SUCCINATE ER 100 MG PO TB24
ORAL_TABLET | ORAL | 3 refills | Status: AC
Start: 2023-12-10 — End: 2024-08-10

## 2023-12-10 NOTE — Telephone Encounter (Signed)
-----   Message from Laurann Montana sent at 12/10/2023  7:56 AM EDT ----- Please let pt know monitor showed short episodes of fast HR from top and bottom of heart -mostly the top. Await echo. Make sure he confirms he is taking Toprol (metoprolol succinate) 100mg  daily. If so, increase Toprol to 100mg  QAM/50mg  QPM and keep f/u as planned.

## 2023-12-10 NOTE — Telephone Encounter (Signed)
 The patient has been notified of the result and verbalized understanding.  All questions (if any) were answered. Roseanne Reno, CMA 12/10/2023 8:11 AM

## 2023-12-12 ENCOUNTER — Other Ambulatory Visit (HOSPITAL_COMMUNITY): Payer: Self-pay | Admitting: Acute Care

## 2023-12-12 DIAGNOSIS — Z87891 Personal history of nicotine dependence: Secondary | ICD-10-CM

## 2023-12-12 DIAGNOSIS — Z122 Encounter for screening for malignant neoplasm of respiratory organs: Secondary | ICD-10-CM

## 2023-12-12 NOTE — Telephone Encounter (Signed)
 NFN

## 2023-12-15 ENCOUNTER — Telehealth: Payer: Self-pay | Admitting: Physician Assistant

## 2023-12-15 NOTE — Telephone Encounter (Signed)
 Called was transferred to me. He said he needs refill on a RX that Dayna prescribed him. He couldn't tell me the name of it.I only see one ordered by her.

## 2023-12-15 NOTE — Telephone Encounter (Signed)
 Rx for lopressor was sent to [facility] on 12/10/23, patient made aware.

## 2023-12-16 ENCOUNTER — Ambulatory Visit (HOSPITAL_COMMUNITY)
Admission: RE | Admit: 2023-12-16 | Discharge: 2023-12-16 | Disposition: A | Discharge status: Home / Self Care | Source: Ambulatory Visit | Attending: Diagnostic Radiology | Admitting: Diagnostic Radiology

## 2023-12-16 ENCOUNTER — Other Ambulatory Visit (HOSPITAL_COMMUNITY)

## 2023-12-16 DIAGNOSIS — J439 Emphysema, unspecified: Secondary | ICD-10-CM | POA: Insufficient documentation

## 2023-12-16 DIAGNOSIS — I700 Atherosclerosis of aorta: Secondary | ICD-10-CM | POA: Insufficient documentation

## 2023-12-16 DIAGNOSIS — I2510 Atherosclerotic heart disease of native coronary artery without angina pectoris: Secondary | ICD-10-CM | POA: Insufficient documentation

## 2023-12-16 DIAGNOSIS — F17210 Nicotine dependence, cigarettes, uncomplicated: Secondary | ICD-10-CM | POA: Insufficient documentation

## 2023-12-16 DIAGNOSIS — Z87891 Personal history of nicotine dependence: Secondary | ICD-10-CM | POA: Diagnosis present

## 2023-12-16 DIAGNOSIS — Z122 Encounter for screening for malignant neoplasm of respiratory organs: Secondary | ICD-10-CM | POA: Insufficient documentation

## 2023-12-17 ENCOUNTER — Ambulatory Visit (HOSPITAL_COMMUNITY): Admission: RE | Admit: 2023-12-17 | Source: Ambulatory Visit

## 2023-12-22 ENCOUNTER — Ambulatory Visit (HOSPITAL_COMMUNITY): Admission: RE | Admit: 2023-12-22 | Source: Ambulatory Visit

## 2024-01-02 ENCOUNTER — Ambulatory Visit (HOSPITAL_COMMUNITY): Admission: RE | Admit: 2024-01-02 | Source: Ambulatory Visit

## 2024-01-12 ENCOUNTER — Other Ambulatory Visit: Payer: Self-pay | Admitting: Acute Care

## 2024-01-12 DIAGNOSIS — F17210 Nicotine dependence, cigarettes, uncomplicated: Secondary | ICD-10-CM

## 2024-01-12 DIAGNOSIS — Z87891 Personal history of nicotine dependence: Secondary | ICD-10-CM

## 2024-01-12 DIAGNOSIS — Z122 Encounter for screening for malignant neoplasm of respiratory organs: Secondary | ICD-10-CM

## 2024-01-15 ENCOUNTER — Ambulatory Visit (HOSPITAL_COMMUNITY)
Admission: RE | Admit: 2024-01-15 | Discharge: 2024-01-15 | Disposition: A | Discharge status: Home / Self Care | Source: Ambulatory Visit | Attending: Physician Assistant | Admitting: Physician Assistant

## 2024-01-15 DIAGNOSIS — J449 Chronic obstructive pulmonary disease, unspecified: Secondary | ICD-10-CM | POA: Diagnosis not present

## 2024-01-15 DIAGNOSIS — E785 Hyperlipidemia, unspecified: Secondary | ICD-10-CM | POA: Diagnosis not present

## 2024-01-15 DIAGNOSIS — E119 Type 2 diabetes mellitus without complications: Secondary | ICD-10-CM | POA: Insufficient documentation

## 2024-01-15 DIAGNOSIS — F17210 Nicotine dependence, cigarettes, uncomplicated: Secondary | ICD-10-CM | POA: Diagnosis not present

## 2024-01-15 DIAGNOSIS — I10 Essential (primary) hypertension: Secondary | ICD-10-CM | POA: Insufficient documentation

## 2024-01-15 DIAGNOSIS — R002 Palpitations: Secondary | ICD-10-CM | POA: Insufficient documentation

## 2024-01-15 LAB — ECHOCARDIOGRAM COMPLETE
AR max vel: 2.5 cm2
AV Area VTI: 2.61 cm2
AV Area mean vel: 2.61 cm2
AV Mean grad: 3.2 mmHg
AV Peak grad: 7 mmHg
Ao pk vel: 1.32 m/s
Area-P 1/2: 1.79 cm2
S' Lateral: 2.7 cm

## 2024-01-15 NOTE — Progress Notes (Signed)
*  PRELIMINARY RESULTS* Echocardiogram 2D Echocardiogram has been performed.  Bernis Brisker 01/15/2024, 10:16 AM

## 2024-02-03 ENCOUNTER — Inpatient Hospital Stay: Payer: 59

## 2024-02-13 ENCOUNTER — Inpatient Hospital Stay: Attending: Hematology

## 2024-02-13 ENCOUNTER — Inpatient Hospital Stay

## 2024-02-13 DIAGNOSIS — D751 Secondary polycythemia: Secondary | ICD-10-CM | POA: Insufficient documentation

## 2024-02-13 LAB — CBC
HCT: 54.1 % — ABNORMAL HIGH (ref 39.0–52.0)
Hemoglobin: 17.1 g/dL — ABNORMAL HIGH (ref 13.0–17.0)
MCH: 28.9 pg (ref 26.0–34.0)
MCHC: 31.6 g/dL (ref 30.0–36.0)
MCV: 91.5 fL (ref 80.0–100.0)
Platelets: 197 10*3/uL (ref 150–400)
RBC: 5.91 MIL/uL — ABNORMAL HIGH (ref 4.22–5.81)
RDW: 16.2 % — ABNORMAL HIGH (ref 11.5–15.5)
WBC: 8.4 10*3/uL (ref 4.0–10.5)
nRBC: 0 % (ref 0.0–0.2)

## 2024-02-13 NOTE — Progress Notes (Signed)
 Hgb 54.1 today.   Harold Bailey presents today for phlebotomy per MD orders. Phlebotomy procedure started at 1220 and ended at 1225. 500 cc removed. Patient tolerated procedure well. IV needle removed intact.  Patient tolerated phlebotomy with no complaints voiced.  Peripheral IV site intact with no bruising or swelling noted.  Denied SOB, chest pain, or dizziness.  Gauze with coban applied.  Discharged with VSS and no s/s of distress noted.

## 2024-02-13 NOTE — Patient Instructions (Signed)
 [facility] [place] - [facility]  Discharge Instructions: Thank you for choosing [facility] to provide your oncology and hematology care.  If you have a lab appointment with the Cancer Center - please note that after April 8th, 2024, all labs will be drawn in the cancer center.  You do not have to check in or register with the main entrance as you have in the past but will complete your check-in in the cancer center.  Wear comfortable clothing and clothing appropriate for easy access to any Portacath or PICC line.   We strive to give you quality time with your provider. You may need to reschedule your appointment if you arrive late (15 or more minutes).  Arriving late affects you and other patients whose appointments are after yours.  Also, if you miss three or more appointments without notifying the office, you may be dismissed from the clinic at the provider's discretion.      For prescription refill requests, have your pharmacy contact our office and allow 72 hours for refills to be completed.    Therapeutic Phlebotomy, Care After The following information offers guidance on how to care for yourself after your procedure. Your health care provider may also give you more specific instructions. If you have problems or questions, contact your health care provider. What can I expect after the procedure? After therapeutic phlebotomy, it is common to have: Light-headedness or dizziness. You may feel faint. Nausea. Tiredness (fatigue). Follow these instructions at home: Eating and drinking Be sure to eat well-balanced meals for the next 24 hours. Drink enough fluid to keep your urine pale yellow. Avoid drinking alcohol on the day that you had the procedure. Activity  Return to your normal activities as told by your health care provider. Most people can go back to their normal activities right away. Avoid activities that take a lot of effort for  about 5 hours after the procedure. Athletes should avoid strenuous exercise for at least 12 hours. Avoid heavy lifting or pulling for about 5 hours after the procedure. Do not lift anything that is heavier than 10 lb (4.5 kg). Change positions slowly for the remainder of the day, like from sitting to standing. This can help prevent light-headedness or fainting. If you feel light-headed, lie down until the feeling goes away. Needle insertion site care  Keep your bandage (dressing) dry. You can remove the bandage after about 5 hours or as told by your health care provider. If you have bleeding from the needle insertion site, raise (elevate) your arm and press firmly on the site until the bleeding stops. If you have bruising at the site, apply ice to the area. To do this: Put ice in a plastic bag. Place a towel between your skin and the bag. Leave the ice on for 20 minutes, 2-3 times a day for the first 24 hours. Remove the ice if your skin turns bright red so you do not damage the area. If the swelling does not go away after 24 hours, apply a warm, moist cloth (warm compress) to the area for 20 minutes, 2-3 times a day. General instructions Do not use any products that contain nicotine or tobacco, like cigarettes, chewing tobacco, and vaping devices, such as e-cigarettes, for at least 30 minutes after the procedure. If you need help quitting, ask your health care provider. Keep all follow-up visits. You may need to continue having regular blood tests and  therapeutic phlebotomy treatments as directed. Contact a health care provider if: You have redness, swelling, or pain at the needle insertion site. Fluid or blood is coming from the needle insertion site. Pus or a bad smell is coming from the needle insertion site. The needle insertion site feels warm to the touch. You feel light-headed, dizzy, or nauseous, and the feeling does not go away. You have new bruising at the needle insertion  site. You feel weaker than normal. You have a fever or chills. Get help right away if: You have chest pain. You have trouble breathing. You have severe nausea or vomiting. Summary After the procedure, it is common to have some light-headedness, dizziness, nausea, or tiredness (fatigue). Be sure to eat well-balanced meals for the next 24 hours. Drink enough fluid to keep your urine pale yellow. Return to your normal activities as told by your health care provider. Keep all follow-up visits. You may need to continue having regular blood tests and therapeutic phlebotomy treatments as directed. This information is not intended to replace advice given to you by your health care provider. Make sure you discuss any questions you have with your health care provider. Document Revised: 02/25/2023 Document Reviewed: 03/07/2021 Elsevier Patient Education  2024 Elsevier Inc.   To help prevent nausea and vomiting after your treatment, we encourage you to take your nausea medication as directed.  BELOW ARE SYMPTOMS THAT SHOULD BE REPORTED IMMEDIATELY: *FEVER GREATER THAN 100.4 F (38 C) OR HIGHER *CHILLS OR SWEATING *NAUSEA AND VOMITING THAT IS NOT CONTROLLED WITH YOUR NAUSEA MEDICATION *UNUSUAL SHORTNESS OF BREATH *UNUSUAL BRUISING OR BLEEDING *URINARY PROBLEMS (pain or burning when urinating, or frequent urination) *BOWEL PROBLEMS (unusual diarrhea, constipation, pain near the anus) TENDERNESS IN MOUTH AND THROAT WITH OR WITHOUT PRESENCE OF ULCERS (sore throat, sores in mouth, or a toothache) UNUSUAL RASH, SWELLING OR PAIN  UNUSUAL VAGINAL DISCHARGE OR ITCHING   Items with * indicate a potential emergency and should be followed up as soon as possible or go to the Emergency Department if any problems should occur.  Please show the CHEMOTHERAPY ALERT CARD or IMMUNOTHERAPY ALERT CARD at check-in to the Emergency Department and triage nurse.  Should you have questions after your visit or need to  cancel or reschedule your appointment, please contact [facility] [place] - A DEPT OF [facility] [phone]  and follow the prompts.  Office hours are 8:00 a.m. to 4:30 p.m. Monday - Friday. Please note that voicemails left after 4:00 p.m. may not be returned until the following business day.  We are closed weekends and major holidays. You have access to a nurse at all times for urgent questions. Please call the main number to the clinic [phone] and follow the prompts.  For any non-urgent questions, you may also contact your provider using MyChart. We now offer e-Visits for anyone 32 and older to request care online for non-urgent symptoms. For details visit [email or web address].   Also download the MyChart app! Go to the app store, search "MyChart", open the app, select [place], and log in with your MyChart username and password.

## 2024-02-23 NOTE — Progress Notes (Signed)
 Cardiology Office Note:  .   Date:  02/24/2024  ID:  Terrence Ferron, DOB September 24, 1965, MRN [id number] PCP: Brantley Caldwell, NP  [facility] Providers Cardiologist:  Janelle Mediate, MD { History of Present Illness: .   Harold Bailey is a 58 y.o. male  with PMHx of nonobstructive CAD by outside cath 2018, prior DVTs, PAD previously managed by vascular surgery, SVT, COPD, GERD, HTN, HLD, aortic atherosclerosis, memory deficit following brain tumor excision, seizures, anxiety, arthritis, tobacco abuse who reports to office for 3 month follow up.   Last seen in heartcare OV 11/18/2023 with Dayna Dunn, PA for routine follow-up.  At that time, reported occasional episodes of palpitations described as rapid HR, lasting from minutes up to an hour, without clear trigger or relieving factor.  Also noted chronic unchanged dyspnea which he attributed to smoking.  Denied any CP or syncope.  Follow-up 14-day ZIO 11/2023 showed predominantly NSR, average HR 83, 3 runs of VT with fastest and longest being 15 beats with max HR of 171, 27 runs of SVT with longest lasting 4 minutes and 34 seconds with average HR of 148.  Recommended increasing Toprol  to 100 mg every morning and 50 mg every afternoon.   Follow up ECHO 12/2023 showed EF 60 to 65%, G1 DD, normal L/R function, mild dilation of aortic root measuring 41 mm. Recommended follow up ECHO in 1 year.   Follow up lab 12/2023 A1c 6.4, LDL 84, CR 0.86, K 4.5, AST/ALT WNL.   Today, note improvement in palpitation until last night felt "heart racing", occurred while at rest lasted 5 mins, relieved with rest.  At that time, felt lightlessness and dizziness. Noted unchanged chronic SOB x 3 years relates to COPD and smoking. Noted unchanged LE edema in ankle due to previous fracture and surgery. Denies CP, syncope, edema. Reports compliance with medications and help managed by his wife. Report healthy diet with mainly home cooked meals. Live in  apartment by hisself. Drink 5-7 glasses of water , 2 glasses of juice, 3 glasses of milk.  Report 1-1.5 PPD x 2 years with heavier use in previous 40 years. Denies Binging ETOH/drug use    Studies Reviewed: Aaron Aas       ECHO 01/15/2024 IMPRESSIONS   1. Left ventricular ejection fraction, by estimation, is 60 to 65%. The  left ventricle has normal function. The left ventricle has no regional  wall motion abnormalities. Left ventricular diastolic parameters are  consistent with Grade I diastolic  dysfunction (impaired relaxation).   2. Right ventricular systolic function is normal. The right ventricular  size is mildly enlarged. Tricuspid regurgitation signal is inadequate for  assessing PA pressure.   3. The mitral valve is normal in structure. No evidence of mitral valve  regurgitation. No evidence of mitral stenosis.   4. The aortic valve has an indeterminant number of cusps. Aortic valve  regurgitation is not visualized. No aortic stenosis is present.   5. Aortic dilatation noted. There is mild dilatation of the aortic root,  measuring 41 mm.   ZIO monitor 11/2023 Patch Wear Time:  13 days and 14 hours (2025-02-25T15:04:29-0500 to 2025-03-11T06:27:58-0400) Patient had a min HR of 46 bpm, max HR of 194 bpm, and avg HR of 83 bpm. Predominant underlying rhythm was Sinus Rhythm. First Degree AV Block was present. 3 Ventricular Tachycardia runs occurred, the run with the fastest interval lasting 15 beats with a  max rate of 171 bpm (avg 157 bpm); the run with  the fastest interval was also the longest. 27 Supraventricular Tachycardia runs occurred, the run with the fastest interval lasting 20 beats with a max rate of 194 bpm, the longest lasting 4 mins 35 secs  with an avg rate of 148 bpm. Isolated SVEs were rare (<1.0%), SVE Couplets were rare (<1.0%), and SVE Triplets were rare (<1.0%). Isolated VEs were rare (<1.0%, 565), VE Couplets were rare (<1.0%, 1), and VE Triplets were rare (<1.0%,  1).  Lexiscan  01/2022 Narrative & Impression      Findings are consistent with prior inferior myocardial infarction. There is no current ischemia The study is low risk.   No ST deviation was noted.   LV perfusion is abnormal.   Left ventricular function is normal. Nuclear stress EF: 60 %. The left ventricular ejection fraction is normal (55-65%). End diastolic cavity size is normal.     Physical Exam:   VS:  BP 104/66   Pulse 67   Ht 6' (1.829 m)   Wt 201 lb (91.2 kg)   SpO2 98%   BMI 27.26 kg/m    Wt Readings from Last 3 Encounters:  02/24/24 201 lb (91.2 kg)  11/18/23 214 lb 3.2 oz (97.2 kg)  07/31/23 200 lb (90.7 kg)    GEN: Sitting in chair in no acute distress NECK: No JVD; No carotid bruits CARDIAC: RRR, no murmurs, rubs, gallops RESPIRATORY:  Clear to auscultation without rales, wheezing or rhonchi  ABDOMEN: Soft, non-tender, non-distended EXTREMITIES:  No edema; No deformity   ASSESSMENT AND PLAN: .   SVT Palpitations  - 14-day ZIO 11/2023 showed predominantly NSR, average HR 83, 3 runs of VT with fastest and longest being 15 beats with max HR of 171, 27 runs of SVT with longest lasting 4 minutes and 34 seconds with average HR of 148.  Recommended increasing Toprol  to 100 mg every morning and 50 mg every afternoon.  - Noted improvement in palpitation until last night felt  "heart racing", lasted 5 mins, relieved with rest.  At that time, felt lightheaded and dizziness. Today, no repeat concerns.  - Continue Toprol  100 mg in the am and 50 mg in pm  Nonobstructive CAD HLD, LDL goal < 70 - Cath at [facility] 04/2017 showing nonobstructive CAD -20% LAD, 40-50% LCx, 20% mid-prox RCA, EF normal.  - Lexiscan  01/2022: Prior inferior MI, no current ischemia, EF 60% - ECHO 12/2023 showed EF 60 to 65%, G1 DD, normal L/R function, mild dilation of aortic root measuring 41 mm. - 12/2023 A1c 6.4, LDL 84, CR 0.86, K 4.5, AST/ALT WNL.  - Denied any CP episodes  - Per wife, he has not been  taken ASA. Encouraged to restart ASA 81 mg  - Continue Crestor  20 mg. Can consider adding Zetia if not at goal in f/u.   HTN  - Bp this office visit: 104/66 - Continue Toprol  100 mg in the am and 50 mg in pm - Encouraged to check BP at home. Dicussed proper BP measurements - If BP too low or worsening dizziness or lightheaded, can consider decreasing Toprol  to 100 mg in am and 25 mg in pm.   Tobacco use - reports 1-1.5 PPD x 2 years with heavier use in previous 40 years. - Discussed cessation but not interested in stopping at this time.   Dispo: F/U in 6 months  Signed, Metta Actis, PA-C

## 2024-02-24 ENCOUNTER — Encounter: Payer: Self-pay | Admitting: Physician Assistant

## 2024-02-24 ENCOUNTER — Ambulatory Visit: Attending: Student | Admitting: Physician Assistant

## 2024-02-24 VITALS — BP 104/66 | HR 67 | Ht 72.0 in | Wt 201.0 lb

## 2024-02-24 DIAGNOSIS — Z720 Tobacco use: Secondary | ICD-10-CM

## 2024-02-24 DIAGNOSIS — R002 Palpitations: Secondary | ICD-10-CM | POA: Diagnosis not present

## 2024-02-24 DIAGNOSIS — E785 Hyperlipidemia, unspecified: Secondary | ICD-10-CM

## 2024-02-24 DIAGNOSIS — I471 Supraventricular tachycardia, unspecified: Secondary | ICD-10-CM | POA: Diagnosis not present

## 2024-02-24 DIAGNOSIS — I2510 Atherosclerotic heart disease of native coronary artery without angina pectoris: Secondary | ICD-10-CM

## 2024-02-24 DIAGNOSIS — I10 Essential (primary) hypertension: Secondary | ICD-10-CM

## 2024-02-24 NOTE — Patient Instructions (Addendum)
 Medication Instructions:  Your physician recommends that you continue on your current medications as directed. Please refer to the Current Medication list given to you today.  Let our office know if you have any worsening symptoms.   *If you need a refill on your cardiac medications before your next appointment, please call your pharmacy*  Lab Work: None If you have labs (blood work) drawn today and your tests are completely normal, you will receive your results only by: MyChart Message (if you have MyChart) OR A paper copy in the mail If you have any lab test that is abnormal or we need to change your treatment, we will call you to review the results.  Testing/Procedures:  None  Please note: We ask at that you not bring children with you during ultrasound (echo/ vascular) testing. Due to room size and safety concerns, children are not allowed in the ultrasound rooms during exams. Our front office staff cannot provide observation of children in our lobby area while testing is being conducted. An adult accompanying a patient to their appointment will only be allowed in the ultrasound room at the discretion of the ultrasound technician under special circumstances. We apologize for any inconvenience.   Follow-Up: At [facility], you and your health needs are our priority.  As part of our continuing mission to provide you with exceptional heart care, our providers are all part of one team.  This team includes your primary Cardiologist (physician) and Advanced Practice Providers or APPs (Physician Assistants and Nurse Practitioners) who all work together to provide you with the care you need, when you need it.  Your next appointment:   6 months  Provider:   You may see Janelle Mediate, MD or one of the following Advanced Practice Providers on your designated Care Team:   Woodfin Hays, PA-C  [place], [place] Theotis Flake, [place]     We recommend signing up for the patient  portal called "MyChart".  Sign up information is provided on this After Visit Summary.  MyChart is used to connect with patients for Virtual Visits (Telemedicine).  Patients are able to view lab/test results, encounter notes, upcoming appointments, etc.  Non-urgent messages can be sent to your provider as well.   To learn more about what you can do with MyChart, go to [email or web address].   Other Instructions

## 2024-02-25 ENCOUNTER — Ambulatory Visit: Payer: 59 | Admitting: Student

## 2024-05-05 ENCOUNTER — Encounter (INDEPENDENT_AMBULATORY_CARE_PROVIDER_SITE_OTHER): Payer: Self-pay | Admitting: *Deleted

## 2024-05-05 ENCOUNTER — Inpatient Hospital Stay: Payer: 59

## 2024-05-05 ENCOUNTER — Inpatient Hospital Stay: Payer: 59 | Attending: Hematology

## 2024-05-12 ENCOUNTER — Encounter: Payer: Self-pay | Admitting: Orthopedic Surgery

## 2024-05-12 ENCOUNTER — Ambulatory Visit (INDEPENDENT_AMBULATORY_CARE_PROVIDER_SITE_OTHER): Admitting: Orthopedic Surgery

## 2024-05-12 DIAGNOSIS — G8929 Other chronic pain: Secondary | ICD-10-CM

## 2024-05-12 DIAGNOSIS — M25362 Other instability, left knee: Secondary | ICD-10-CM

## 2024-05-12 DIAGNOSIS — M25562 Pain in left knee: Secondary | ICD-10-CM

## 2024-05-12 DIAGNOSIS — S83002A Unspecified subluxation of left patella, initial encounter: Secondary | ICD-10-CM

## 2024-05-12 NOTE — Progress Notes (Signed)
   There were no vitals taken for this visit.  There is no height or weight on file to calculate BMI.  Chief Complaint  Patient presents with   Knee Problem    Left      No diagnosis found.  DOI/DOS/ Date: ongoing   Improved with brace/ he states it helps but still has trouble with the knee

## 2024-05-12 NOTE — Patient Instructions (Signed)
 While we are working on your approval for MRI please go ahead and call to schedule your appointment with [facility] within at least one (1) week.   Central Scheduling [phone]

## 2024-05-12 NOTE — Progress Notes (Signed)
   Chief Complaint  Patient presents with   Knee Problem    Left      Harold Bailey is a 58 year old male who had a stroke and a brain tumor and presented to us  in August 2024 with hyperextending knee which started after his stroke indicating some type of muscle imbalance.  This was treated with a [employer] brace which controlled those symptoms.  He comes in today saying that his patella moves too much and occasionally it pops out or feels like it is going to pop out of place and locks up  He also complains of some pain on the medial side of his knee requiring a pillow to be placed into the knee  He has had images in the past they were essentially unremarkable perhaps maybe a slight amount of joint space narrowing which may account for his medial joint symptoms  Examination  Appearance normal  Orientation mood all normal  Gait he has a brace on his left knee no instability in the brace  Inspection of his knee is correlated with his opposite knee and he does have hypermobile left patella and he has apprehension at 20 degrees with lateral stress knee range of motion remains intact no other ligamentous instability and skin is intact gross muscle strength and muscle tone normal pulse perfusion and temperature normal and no sensory deficits on this side  Assessment and plan  Encounter Diagnoses  Name Primary?   Chronic pain of left knee Yes   Instability of left knee joint    Acquired subluxation of left patella, initial encounter      Hypermobile patella with rule out subluxation  Recommend MRI to assess the trochlear groove tibial tubercle distance and rule out any intra-articular pathology

## 2024-05-19 ENCOUNTER — Encounter: Payer: Self-pay | Admitting: Oncology

## 2024-05-20 ENCOUNTER — Ambulatory Visit (HOSPITAL_COMMUNITY)
Admission: RE | Admit: 2024-05-20 | Discharge: 2024-05-20 | Disposition: A | Discharge status: Home / Self Care | Source: Ambulatory Visit | Attending: Orthopedic Surgery | Admitting: Orthopedic Surgery

## 2024-05-20 ENCOUNTER — Encounter: Payer: Self-pay | Admitting: Gastroenterology

## 2024-05-20 DIAGNOSIS — M25362 Other instability, left knee: Secondary | ICD-10-CM | POA: Diagnosis present

## 2024-05-31 ENCOUNTER — Ambulatory Visit (INDEPENDENT_AMBULATORY_CARE_PROVIDER_SITE_OTHER): Admitting: Orthopedic Surgery

## 2024-05-31 DIAGNOSIS — M25562 Pain in left knee: Secondary | ICD-10-CM

## 2024-05-31 DIAGNOSIS — G8929 Other chronic pain: Secondary | ICD-10-CM

## 2024-05-31 NOTE — Progress Notes (Signed)
 MRI left knee follow-up  Patient complains of knee locking on him at times.  He is in a brace to correct hyperextension deformity which developed after ankle fusion  The braces helped him significantly but he came in for symptoms of the knee locking.  He says when he is walking sometimes it will lock up.  His MRI was reviewed I saw all the pictures he does not have any intra-articular mechanical issues that I can see just mild arthritis.  The MRI did show some type of abnormality in the popliteal vascular tree and ultrasound was recommended which was ordered  Patient will come back from that  Patient will wear his brace more often and we will reexamine the knee on his next visit

## 2024-05-31 NOTE — Progress Notes (Signed)
   There were no vitals taken for this visit.  There is no height or weight on file to calculate BMI.  Chief Complaint  Patient presents with   Results    MRI LEFT KNEE    No diagnosis found.  DOI/DOS/ Date: ongoing   Unchanged- kind of locked up on me     Unusual structures running along the neurovascular bundle in the popliteal fossa. These could be lymph nodes or possibly involving the vascular structures. / US  ordered

## 2024-05-31 NOTE — Patient Instructions (Signed)
 For US  please go ahead and call to schedule your appointment with [facility] within at least one (1) week.   Central Scheduling [phone]

## 2024-06-03 ENCOUNTER — Encounter (HOSPITAL_COMMUNITY): Payer: Self-pay

## 2024-06-03 ENCOUNTER — Ambulatory Visit (HOSPITAL_COMMUNITY)
Admission: RE | Admit: 2024-06-03 | Discharge: 2024-06-03 | Disposition: A | Discharge status: Home / Self Care | Source: Ambulatory Visit | Attending: Orthopedic Surgery | Admitting: Orthopedic Surgery

## 2024-06-03 DIAGNOSIS — G8929 Other chronic pain: Secondary | ICD-10-CM | POA: Insufficient documentation

## 2024-06-03 DIAGNOSIS — M25562 Pain in left knee: Secondary | ICD-10-CM | POA: Diagnosis present

## 2024-06-14 ENCOUNTER — Ambulatory Visit (INDEPENDENT_AMBULATORY_CARE_PROVIDER_SITE_OTHER): Admitting: Orthopedic Surgery

## 2024-06-14 DIAGNOSIS — M1712 Unilateral primary osteoarthritis, left knee: Secondary | ICD-10-CM | POA: Diagnosis not present

## 2024-06-14 DIAGNOSIS — I739 Peripheral vascular disease, unspecified: Secondary | ICD-10-CM

## 2024-06-14 DIAGNOSIS — M25362 Other instability, left knee: Secondary | ICD-10-CM

## 2024-06-14 MED ORDER — METHYLPREDNISOLONE ACETATE 40 MG/ML IJ SUSP
40.0000 mg | Freq: Once | INTRAMUSCULAR | Status: AC
  Administered 2024-06-14: 40 mg via INTRA_ARTICULAR

## 2024-06-14 NOTE — Addendum Note (Signed)
 Addended byBETHA JENEAN GREIG LELON on: 06/14/2024 03:31 PM   Modules accepted: Orders

## 2024-06-14 NOTE — Progress Notes (Signed)
   There were no vitals taken for this visit.  There is no height or weight on file to calculate BMI.  Chief Complaint  Patient presents with   Results    Vasular and arterial

## 2024-06-14 NOTE — Progress Notes (Signed)
   Chief Complaint  Patient presents with   Results    Vasular and arterial    Encounter Diagnoses  Name Primary?   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  After cleaning the skin with alcohol a 20-gauge needle was used to inject the left knee joint. There were no complications. A sterile bandage was applied.   MRI result   IMPRESSION: Mild tricompartment osteoarthrosis. Benign infarct in the posterior medial tibial plateau. No significant joint effusion.   Ligaments are intact. Mild degenerative changes in the menisci without evidence of a discrete meniscal tear.   Unusual structures running along the neurovascular bundle in the popliteal fossa. These could be lymph nodes or possibly involving the vascular structures. Correlation with venous and arterial ultrasound of the popliteal fossa.   Electronically signed by: Norleen Satchel MD 05/22/2024 11:08 AM EDT RP Workstation: [id number]

## 2024-06-14 NOTE — Addendum Note (Signed)
 Addended byBETHA JENEAN GREIG LELON on: 06/14/2024 04:31 PM   Modules accepted: Orders

## 2024-06-29 ENCOUNTER — Encounter (HOSPITAL_COMMUNITY): Payer: Self-pay

## 2024-06-29 ENCOUNTER — Encounter (HOSPITAL_COMMUNITY)
Admission: RE | Admit: 2024-06-29 | Discharge: 2024-06-29 | Disposition: A | Discharge status: Home / Self Care | Source: Ambulatory Visit | Attending: Optometry | Admitting: Optometry

## 2024-07-01 NOTE — H&P (Signed)
 Surgical History & Physical  Patient Name: Harold Bailey  DOB: 06-Jul-1966  Surgery: Cataract extraction with intraocular lens implant phacoemulsification; Right Eye Surgeon: Marsa Cleverly MD Surgery Date: 07/02/2024 Pre-Op Date: 05/25/2024  HPI: A 26 Yr. old male patient 1. The patient is a new patient here today for a Cataract Evaluation. The patient complains of difficulty when reading fine print, books, newspaper, instructions etc., which began 2 years ago. Both eyes are affected. The episode is constant. The patient describes foggy and hazy symptoms affecting their eyes/vision. This is negatively affecting the patient's quality of life and the patient is unable to function adequately in life with the current level of vision.  Medical History: Cataracts  Arthritis Diabetes Lung Problems Stroke  Review of Systems Musculoskeletal arthritis Neurological Stroke All recorded systems are negative except as noted above.  Social Current every day smoker of Cigarettes   Medication Prednisolone-moxiflox-bromfen,  Metformin , Tizanidine, Levetiracetam , Zolpidem, Albuterol  sulfate, Fluticasone  propionate, Albuterol  sulfate, Ibuprofen , Metoprolol  succinate, Tamsulosin, Buprenorphine HCl, Ventolin  HFA, Pantoprazole , Trazodone , Varenicline  tartrate, Myrbetriq, Trelegy Ellipta   Sx/Procedures None  Drug Allergies  NKDA  History & Physical: Heent: cataracts NECK: supple without bruits LUNGS: lungs clear to auscultation CV: regular rate and rhythm Abdomen: soft and non-tender  Impression & Plan: Assessment: 1.  CATARACT NUCLEAR SCLEROSIS AGE RELATED; Both Eyes (H25.13) 2.  KERATOCONJUNCTIVITIS SICCA NOT SPECIFIED AS SJORGRENS; Both Eyes (H16.223) 3.  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  Cataracts are visually significant and account for the patient's complaints. Discussed all risks, benefits, procedures and recovery,  including infection, loss of vision and eye, need for glasses after surgery or additional procedures. Patient understands changing glasses will not improve vision. Patient indicated understanding of procedure. All questions answered. Patient desires to have surgery, recommend phacoemulsification with intraocular lens. Patient to have preliminary testing necessary ([facility]os/IOL Master, Mac OCT, TOPO) Educational materials provided: Cataract.  Plan: - Proceed with cataract surgery OD, hold on OS and reassess after first eye surgery - Plan for best distance target with DIB00 - No DM, no fuchs, no prior eye surgery - good dilation - Dextenza if available  2.  Dry eye. Mild signs of dry eye at this time. Can use artificial tears QID OU PRN and warm compresses once daily as needed.  3.  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  4.  Per [facility] in [place]

## 2024-07-02 ENCOUNTER — Ambulatory Visit (HOSPITAL_COMMUNITY)

## 2024-07-02 ENCOUNTER — Encounter (HOSPITAL_COMMUNITY): Admission: RE | Disposition: A | Payer: Self-pay | Source: Home / Self Care | Attending: Optometry

## 2024-07-02 ENCOUNTER — Other Ambulatory Visit: Payer: Self-pay

## 2024-07-02 ENCOUNTER — Encounter (HOSPITAL_COMMUNITY): Payer: Self-pay | Admitting: Optometry

## 2024-07-02 ENCOUNTER — Ambulatory Visit (HOSPITAL_COMMUNITY)
Admission: RE | Admit: 2024-07-02 | Discharge: 2024-07-02 | Disposition: A | Discharge status: Home / Self Care | Attending: Optometry | Admitting: Optometry

## 2024-07-02 DIAGNOSIS — R569 Unspecified convulsions: Secondary | ICD-10-CM | POA: Diagnosis not present

## 2024-07-02 DIAGNOSIS — K219 Gastro-esophageal reflux disease without esophagitis: Secondary | ICD-10-CM | POA: Diagnosis not present

## 2024-07-02 DIAGNOSIS — J449 Chronic obstructive pulmonary disease, unspecified: Secondary | ICD-10-CM | POA: Diagnosis not present

## 2024-07-02 DIAGNOSIS — H53461 Homonymous bilateral field defects, right side: Secondary | ICD-10-CM | POA: Insufficient documentation

## 2024-07-02 DIAGNOSIS — I10 Essential (primary) hypertension: Secondary | ICD-10-CM

## 2024-07-02 DIAGNOSIS — H524 Presbyopia: Secondary | ICD-10-CM | POA: Insufficient documentation

## 2024-07-02 DIAGNOSIS — F419 Anxiety disorder, unspecified: Secondary | ICD-10-CM | POA: Insufficient documentation

## 2024-07-02 DIAGNOSIS — R0602 Shortness of breath: Secondary | ICD-10-CM | POA: Insufficient documentation

## 2024-07-02 DIAGNOSIS — F17210 Nicotine dependence, cigarettes, uncomplicated: Secondary | ICD-10-CM

## 2024-07-02 DIAGNOSIS — G4730 Sleep apnea, unspecified: Secondary | ICD-10-CM | POA: Diagnosis not present

## 2024-07-02 DIAGNOSIS — H16223 Keratoconjunctivitis sicca, not specified as Sjogren's, bilateral: Secondary | ICD-10-CM | POA: Diagnosis not present

## 2024-07-02 DIAGNOSIS — H2511 Age-related nuclear cataract, right eye: Secondary | ICD-10-CM | POA: Insufficient documentation

## 2024-07-02 DIAGNOSIS — R519 Headache, unspecified: Secondary | ICD-10-CM | POA: Insufficient documentation

## 2024-07-02 DIAGNOSIS — E1136 Type 2 diabetes mellitus with diabetic cataract: Secondary | ICD-10-CM | POA: Diagnosis not present

## 2024-07-02 DIAGNOSIS — H53462 Homonymous bilateral field defects, left side: Secondary | ICD-10-CM | POA: Insufficient documentation

## 2024-07-02 HISTORY — PX: CATARACT EXTRACTION W/PHACO: SHX586

## 2024-07-02 LAB — GLUCOSE, CAPILLARY: Glucose-Capillary: 115 mg/dL — ABNORMAL HIGH (ref 70–99)

## 2024-07-02 SURGERY — PHACOEMULSIFICATION, CATARACT, WITH IOL INSERTION
Anesthesia: Monitor Anesthesia Care | Site: Eye | Laterality: Right

## 2024-07-02 MED ORDER — LIDOCAINE HCL (PF) 1 % IJ SOLN
INTRAMUSCULAR | Status: DC | PRN
  Administered 2024-07-02: 2 mL

## 2024-07-02 MED ORDER — BSS IO SOLN
INTRAOCULAR | Status: DC | PRN
  Administered 2024-07-02: 15 mL via INTRAOCULAR

## 2024-07-02 MED ORDER — STERILE WATER FOR IRRIGATION IR SOLN
Status: DC | PRN
  Administered 2024-07-02: 250 mL

## 2024-07-02 MED ORDER — MOXIFLOXACIN HCL 5 MG/ML IO SOLN
INTRAOCULAR | Status: DC | PRN
  Administered 2024-07-02: .2 mL via INTRACAMERAL

## 2024-07-02 MED ORDER — SODIUM CHLORIDE 0.9% FLUSH
INTRAVENOUS | Status: DC | PRN
Start: 2024-07-02 — End: 2024-07-02
  Administered 2024-07-02: 3 mL via INTRAVENOUS

## 2024-07-02 MED ORDER — MIDAZOLAM HCL 2 MG/2ML IJ SOLN
INTRAMUSCULAR | Status: DC | PRN
Start: 2024-07-02 — End: 2024-07-02
  Administered 2024-07-02: 1 mg via INTRAVENOUS

## 2024-07-02 MED ORDER — TETRACAINE HCL 0.5 % OP SOLN
1.0000 [drp] | OPHTHALMIC | Status: AC
  Administered 2024-07-02 (×3): 1 [drp] via OPHTHALMIC

## 2024-07-02 MED ORDER — PHENYLEPHRINE HCL 2.5 % OP SOLN
1.0000 [drp] | OPHTHALMIC | Status: AC
  Administered 2024-07-02 (×3): 1 [drp] via OPHTHALMIC

## 2024-07-02 MED ORDER — POVIDONE-IODINE 5 % OP SOLN
OPHTHALMIC | Status: DC | PRN
  Administered 2024-07-02: 1 via OPHTHALMIC

## 2024-07-02 MED ORDER — LIDOCAINE HCL 3.5 % OP GEL
1.0000 | Freq: Once | OPHTHALMIC | Status: AC
  Administered 2024-07-02: 1 via OPHTHALMIC

## 2024-07-02 MED ORDER — PHENYLEPHRINE-KETOROLAC 1-0.3 % IO SOLN
INTRAOCULAR | Status: DC | PRN
  Administered 2024-07-02: 500 mL via OPHTHALMIC

## 2024-07-02 MED ORDER — MIDAZOLAM HCL 2 MG/2ML IJ SOLN
INTRAMUSCULAR | Status: AC
  Filled 2024-07-02: qty 2

## 2024-07-02 MED ORDER — TROPICAMIDE 1 % OP SOLN
1.0000 [drp] | OPHTHALMIC | Status: AC
  Administered 2024-07-02 (×3): 1 [drp] via OPHTHALMIC

## 2024-07-02 MED ORDER — SIGHTPATH DOSE#1 NA HYALUR & NA CHOND-NA HYALUR IO KIT
PACK | INTRAOCULAR | Status: DC | PRN
  Administered 2024-07-02: 1 via OPHTHALMIC

## 2024-07-02 SURGICAL SUPPLY — 12 items
CLOTH BEACON ORANGE TIMEOUT ST (SAFETY) ×2 IMPLANT
DRSG TEGADERM 4X4.75 (GAUZE/BANDAGES/DRESSINGS) ×2 IMPLANT
EYE SHIELD UNIVERSAL CLEAR (GAUZE/BANDAGES/DRESSINGS) IMPLANT
FEE CATARACT SUITE SIGHTPATH (MISCELLANEOUS) ×2 IMPLANT
GLOVE BIOGEL PI IND STRL 7.0 (GLOVE) ×4 IMPLANT
LENS IOL TECNIS EYHANCE 19.5 (Intraocular Lens) IMPLANT
NDL HYPO 18GX1.5 BLUNT FILL (NEEDLE) ×2 IMPLANT
NEEDLE HYPO 18GX1.5 BLUNT FILL (NEEDLE) ×1 IMPLANT
PAD ARMBOARD POSITIONER FOAM (MISCELLANEOUS) ×2 IMPLANT
SYR TB 1ML LL NO SAFETY (SYRINGE) ×2 IMPLANT
TAPE SURG TRANSPORE 1 IN (GAUZE/BANDAGES/DRESSINGS) IMPLANT
WATER STERILE IRR 250ML POUR (IV SOLUTION) ×2 IMPLANT

## 2024-07-02 NOTE — Anesthesia Preprocedure Evaluation (Addendum)
 Anesthesia Evaluation  Patient identified by MRN, date of birth, ID band Patient awake    Reviewed: Allergy & Precautions, H&P , NPO status , Patient's Chart, lab work & pertinent test results  Airway Mallampati: II  TM Distance: >3 FB Neck ROM: Full    Dental  (+) Edentulous Upper, Edentulous Lower   Pulmonary neg pulmonary ROS, shortness of breath, sleep apnea , pneumonia, COPD, Current Smoker and Patient abstained from smoking.   Pulmonary exam normal breath sounds clear to auscultation       Cardiovascular hypertension, negative cardio ROS Normal cardiovascular exam+ dysrhythmias  Rhythm:Regular Rate:Normal     Neuro/Psych  Headaches, Seizures -,  PSYCHIATRIC DISORDERS Anxiety      Neuromuscular disease negative neurological ROS  negative psych ROS   GI/Hepatic negative GI ROS, Neg liver ROS,GERD  ,,  Endo/Other  negative endocrine ROSdiabetes    Renal/GU negative Renal ROS  negative genitourinary   Musculoskeletal negative musculoskeletal ROS (+) Arthritis ,    Abdominal   Peds negative pediatric ROS (+)  Hematology negative hematology ROS (+)   Anesthesia Other Findings   Reproductive/Obstetrics negative OB ROS                              Anesthesia Physical Anesthesia Plan  ASA: 3  Anesthesia Plan: MAC   Post-op Pain Management:    Induction:   PONV Risk Score and Plan:   Airway Management Planned: Nasal Cannula  Additional Equipment:   Intra-op Plan:   Post-operative Plan:   Informed Consent: I have reviewed the patients History and Physical, chart, labs and discussed the procedure including the risks, benefits and alternatives for the proposed anesthesia with the patient or authorized representative who has indicated his/her understanding and acceptance.     Dental advisory given  Plan Discussed with: CRNA  Anesthesia Plan Comments:           Anesthesia Quick Evaluation

## 2024-07-02 NOTE — Anesthesia Procedure Notes (Signed)
 Date/Time: 07/02/2024 9:13 AM  Performed by: Barbarann Verneita RAMAN, CRNAPre-anesthesia Checklist: Patient identified, Emergency Drugs available, Suction available, Timeout performed and Patient being monitored Patient Re-evaluated:Patient Re-evaluated prior to induction Oxygen Delivery Method: Nasal Cannula

## 2024-07-02 NOTE — Interval H&P Note (Signed)
 History and Physical Interval Note:  07/02/2024 8:44 AM  The H and P was reviewed and updated. The patient was examined.  No changes were found after exam.  The surgical eye was marked.   Harold Bailey

## 2024-07-02 NOTE — Op Note (Signed)
 Date of procedure: 07/02/24  Pre-operative diagnosis: Visually significant age-related nuclear cataract, Right Eye (H25.11)  Post-operative diagnosis: Visually significant age-related nuclear cataract, Right Eye H25.11  Procedure: Removal of cataract via phacoemulsification and insertion of intra-ocular lens J&J DIBOO +19.5D into the capsular bag of the Right Eye  Attending surgeon: Marsa Cleverly, MD  Anesthesia: MAC, Topical Akten  Complications: None  Estimated Blood Loss: <46mL (minimal)  Specimens: None  Implants:  Implant Name Type Inv. Item Serial No. Manufacturer Lot No. LRB No. Used Action  LENS IOL TECNIS EYHANCE 19.5 - [id number] Intraocular Lens LENS IOL TECNIS EYHANCE 19.5 [id number] [employer]  Right 1 Implanted    Indications:  Visually significant age-related cataract, Right Eye  Procedure:  The patient was seen and identified in the pre-operative area. The operative eye was identified and dilated.  The operative eye was marked.  Topical anesthesia was administered to the operative eye.     The patient was then to the operative suite and placed in the supine position.  A timeout was performed confirming the patient, procedure to be performed, and all other relevant information.   The patient's face was prepped and draped in the usual fashion for intra-ocular surgery.  A lid speculum was placed into the operative eye and the surgical microscope moved into place and focused.  A superotemporal paracentesis was created using a 20 gauge paracentesis blade.  BSS mixed with Omidria, followed by 1% lidocaine  was injected into the anterior chamber.  Viscoelastic was injected into the anterior chamber.  A temporal clear-corneal main wound incision was created using a 2.34mm microkeratome.  A continuous curvilinear capsulorrhexis was initiated using an irrigating cystitome and completed using capsulorrhexis forceps.  Hydrodissection and hydrodeliniation were performed.  Viscoelastic  was injected into the anterior chamber.  A phacoemulsification handpiece and a chopper as a second instrument were used to remove the nucleus and epinucleus. The irrigation/aspiration handpiece was used to remove any remaining cortical material.   The capsular bag was reinflated with viscoelastic, checked, and found to be intact.  The intraocular lens was inserted into the capsular bag.  The irrigation/aspiration handpiece was used to remove any remaining viscoelastic.  The clear corneal wound and paracentesis wounds were then hydrated and checked with Weck-Cels to be watertight. Moxifloxacin was instilled into the anterior chamber.  The lid-speculum and drape were removed. The patient's face was cleaned with a wet and dry 4x4. A clear shield was taped over the eye. The patient was taken to the post-operative care unit in good condition, having tolerated the procedure well.  Post-Op Instructions: The patient will follow up at [facility] for a same day post-operative evaluation and will receive all other orders and instructions.

## 2024-07-02 NOTE — Anesthesia Postprocedure Evaluation (Signed)
 Anesthesia Post Note  Patient: ILLIAS PANTANO  Procedure(s) Performed: PHACOEMULSIFICATION, CATARACT, WITH IOL INSERTION (Right: Eye)  Patient location during evaluation: PACU Anesthesia Type: MAC Level of consciousness: awake and alert Pain management: pain level controlled Vital Signs Assessment: post-procedure vital signs reviewed and stable Respiratory status: spontaneous breathing, nonlabored ventilation, respiratory function stable and patient connected to nasal cannula oxygen Cardiovascular status: blood pressure returned to baseline and stable Postop Assessment: no apparent nausea or vomiting Anesthetic complications: no   No notable events documented.   Last Vitals:  Vitals:   07/02/24 0821 07/02/24 0935  BP: (!) 113/90 110/78  Pulse: 91 87  Resp: 17 18  Temp: 36.8 C 37.2 C  SpO2: 93% 97%    Last Pain:  Vitals:   07/02/24 0935  TempSrc: Oral  PainSc: 0-No pain                 Andrea Limes

## 2024-07-02 NOTE — Discharge Instructions (Signed)
 Please discharge patient when stable, will follow up today with Dr. Ilsa Iha at the [facility] office immediately following discharge.  Leave shield in place until visit.  All paperwork with discharge instructions will be given at the office.  [facility] Address:  [address]  [place], [place] [address]  Dr. Chaya Jan Phone: [phone]

## 2024-07-02 NOTE — Transfer of Care (Signed)
 Immediate Anesthesia Transfer of Care Note  Patient: Harold Bailey  Procedure(s) Performed: PHACOEMULSIFICATION, CATARACT, WITH IOL INSERTION (Right: Eye)  Patient Location: Short Stay  Anesthesia Type:MAC  Level of Consciousness: awake and patient cooperative  Airway & Oxygen Therapy: Patient Spontanous Breathing  Post-op Assessment: Report given to RN and Post -op Vital signs reviewed and stable  Post vital signs: Reviewed and stable  Last Vitals:  Vitals Value Taken Time  BP 110/78 07/02/24  937  Temp 98.9 07/02/24  937  Pulse 72 07/02/24  937  Resp 16 07/02/24  937  SpO2 95 07/02/24 937    Last Pain:  Vitals:   07/02/24 0821  TempSrc: Oral  PainSc: 0-No pain         Complications: No notable events documented.

## 2024-07-05 ENCOUNTER — Ambulatory Visit: Admitting: Gastroenterology

## 2024-07-05 ENCOUNTER — Telehealth: Payer: Self-pay | Admitting: Orthopedic Surgery

## 2024-07-05 NOTE — Telephone Encounter (Signed)
 LM for him to call back.

## 2024-07-05 NOTE — Telephone Encounter (Signed)
 Dr. Areatha pt - pt lvm stating that for the last two days he can barely walk w/out almost falling.  He wants to know if he can get some pain medication to get him through until you do surgery.

## 2024-07-05 NOTE — Telephone Encounter (Signed)
 According to [facility] clinic notes Patient is taking buprenorphine for chronic pain management.

## 2024-07-05 NOTE — Telephone Encounter (Signed)
 I was not aware he needed surgery I will call him

## 2024-07-06 ENCOUNTER — Telehealth: Payer: Self-pay

## 2024-07-06 NOTE — Telephone Encounter (Signed)
 Patient called back again, he stated it was a splint behind his knee is what he's gonna need.  He wants to know if this is causing all this pain, he stated it's unreal.  He left a really long message about the splint and his BIL had the same thing and was in a lot of pain w/the splint.

## 2024-07-06 NOTE — Telephone Encounter (Addendum)
 Called him to advise ok to take off brace he states brace not bothering him it helps him.

## 2024-07-06 NOTE — Telephone Encounter (Signed)
 Copied from CRM [id number]. Topic: Appointments - Transfer of Care >> Jul 06, 2024 11:47 AM Darshell M wrote: Pt is requesting to transfer FROM: Raina Nsumanganyi Pt is requesting to transfer TO: Leita Longs Reason for requested transfer: Provider no longer with [facility] It is the responsibility of the team the patient would like to transfer to Francie Longs) to reach out to the patient if for any reason this transfer is not acceptable.   Patient looking to establish care. Scheduled first available 11/08/2024. Patient is on seizure medication and is having a stent placed soon. Needs to be seen this year. Will need medication refills. CB# [phone]

## 2024-07-06 NOTE — Telephone Encounter (Signed)
 I called him he states pain in the knee  Walked to kitchen can barely walk He is going to have vascular consult Nov 11th Nov 18th He said he did have the US  done at [facility]

## 2024-07-06 NOTE — Telephone Encounter (Signed)
 Spoke w/the pt this morning, he doesn't know what Buprenorphine is or what it's for.  He also had questions about the brace, if it would be causing this much pain.

## 2024-07-06 NOTE — Telephone Encounter (Signed)
 Scheduled and added to a waiting list

## 2024-07-12 ENCOUNTER — Other Ambulatory Visit: Payer: Self-pay

## 2024-07-12 DIAGNOSIS — N186 End stage renal disease: Secondary | ICD-10-CM

## 2024-07-29 ENCOUNTER — Telehealth: Payer: Self-pay

## 2024-07-29 NOTE — Telephone Encounter (Signed)
 I called him he said I called him, and left message  Told him it must have been old was several weeks ago He voiced understanding

## 2024-07-29 NOTE — Telephone Encounter (Signed)
 Patient left message on vm saying he was returning your call. He is asking for you to give him a call at [phone]

## 2024-08-03 ENCOUNTER — Ambulatory Visit (INDEPENDENT_AMBULATORY_CARE_PROVIDER_SITE_OTHER)

## 2024-08-03 DIAGNOSIS — N186 End stage renal disease: Secondary | ICD-10-CM

## 2024-08-04 LAB — VAS US ABI WITH/WO TBI
Left ABI: 0.67
Right ABI: 0.79

## 2024-08-05 ENCOUNTER — Inpatient Hospital Stay: Payer: 59

## 2024-08-06 ENCOUNTER — Inpatient Hospital Stay: Attending: Hematology

## 2024-08-06 ENCOUNTER — Inpatient Hospital Stay

## 2024-08-06 DIAGNOSIS — D751 Secondary polycythemia: Secondary | ICD-10-CM | POA: Insufficient documentation

## 2024-08-06 LAB — CBC
HCT: 53 % — ABNORMAL HIGH (ref 39.0–52.0)
Hemoglobin: 17.5 g/dL — ABNORMAL HIGH (ref 13.0–17.0)
MCH: 31.5 pg (ref 26.0–34.0)
MCHC: 33 g/dL (ref 30.0–36.0)
MCV: 95.5 fL (ref 80.0–100.0)
Platelets: 152 K/uL (ref 150–400)
RBC: 5.55 MIL/uL (ref 4.22–5.81)
RDW: 14.4 % (ref 11.5–15.5)
WBC: 7.6 K/uL (ref 4.0–10.5)
nRBC: 0 % (ref 0.0–0.2)

## 2024-08-06 NOTE — Progress Notes (Signed)
 [name] presents today for phlebotomy per MD orders. HCT 53. Will proceed with phlebotomy per MD orders. Phlebotomy procedure started at 1225 and ended at 1234 500 cc removed. Patient tolerated procedure well. IV needle removed intact. Pt decline to wait the 30 post observation. VSS. Pt stable at discharge.   Harold Bailey

## 2024-08-06 NOTE — Patient Instructions (Signed)

## 2024-08-08 NOTE — Progress Notes (Deleted)
 GI Office Note    Referring Provider: Benjamin Raina Sheets* Primary Care Physician:  Benjamin Raina Elizabeth, NP Primary Gastroenterologist: *** Date:  08/09/2024  ID:  Harold Bailey, DOB 1966-08-23, MRN [id number]   Chief Complaint   No chief complaint on file.  History of Present Illness  Harold Bailey is a 58 y.o. male with a history of MI in 2009, SVT, polycythemia, HTN, HLD, GERD, DVT, drug abuse, COPD, diabetes, anxiety, arthritis, and colon polyps?*** presenting today with complaint of ***  Colonoscopy June 2020 with LBGI: - *** - Path: ***   Today:     Wt Readings from Last 6 Encounters:  07/02/24 216 lb (98 kg)  02/24/24 201 lb (91.2 kg)  11/18/23 214 lb 3.2 oz (97.2 kg)  07/31/23 200 lb (90.7 kg)  06/27/23 205 lb (93 kg)  05/12/23 202 lb (91.6 kg)    There is no height or weight on file to calculate BMI.   Current Outpatient Medications  Medication Sig Dispense Refill   albuterol  (PROVENTIL  HFA;VENTOLIN  HFA) 108 (90 Base) MCG/ACT inhaler Inhale 2 puffs into the lungs every 6 (six) hours as needed for wheezing or shortness of breath. 1 Inhaler 6   allopurinol  (ZYLOPRIM ) 300 MG tablet Take 300 mg by mouth daily.     aspirin  EC 81 MG tablet Take 81 mg by mouth daily.     EMGALITY 120 MG/ML SOSY      fluticasone  (FLONASE ) 50 MCG/ACT nasal spray Place 2 sprays into both nostrils daily.     Fluticasone -Umeclidin-Vilant (TRELEGY ELLIPTA ) 100-62.5-25 MCG/ACT AEPB Inhale 1 puff into the lungs daily. 60 each 1   gabapentin  (NEURONTIN ) 300 MG capsule Take 300 mg by mouth daily.     hydrOXYzine (ATARAX) 50 MG tablet Take 50 mg by mouth 2 (two) times daily as needed.     ibuprofen  (ADVIL ) 800 MG tablet TAKE 1 TABLET BY MOUTH EVERY 8 HOURS AS NEEDED. MAX 3 PER DAY     levETIRAcetam  (KEPPRA ) 500 MG tablet TAKE 1 TABLET(500 MG) BY MOUTH TWICE DAILY 60 tablet 3   meclizine (ANTIVERT) 25 MG tablet Take 25 mg by mouth 3 (three) times daily as needed.      metFORMIN  (GLUCOPHAGE ) 500 MG tablet Take 500 mg by mouth 2 (two) times daily.     metoprolol  succinate (TOPROL -XL) 100 MG 24 hr tablet Take 1 tablet (100 mg total) by mouth every morning AND 0.5 tablets (50 mg total) every evening. Take with or immediately following a meal.. 135 tablet 3   mirabegron ER (MYRBETRIQ) 25 MG TB24 tablet Take 25 mg by mouth daily.     Multiple Vitamins-Minerals (MULTIVITAMIN WITH MINERALS) tablet Take 2 tablets by mouth daily.      naloxone (NARCAN) nasal spray 4 mg/0.1 mL      Naphazoline-Pheniramine (OPCON-A ) 0.027-0.315 % SOLN Place 1 drop into both eyes every 8 (eight) hours as needed (red, irritated eyes).     neomycin-polymyxin-hydrocortisone (CORTISPORIN) 3.5-10000-1 OTIC suspension Place 2 drops into the left ear 2 (two) times daily as needed (ear pain).     Oxycodone  HCl 10 MG TABS Take 10 mg by mouth 4 (four) times daily as needed.     oxyCODONE -acetaminophen  (PERCOCET/ROXICET) 5-325 MG tablet Take 1 tablet by mouth every 4 (four) hours as needed. 30 tablet 0   OXYGEN Inhale into the lungs. At home oxygen     pantoprazole  (PROTONIX ) 40 MG tablet Take 40 mg by mouth daily.     rosuvastatin  (CRESTOR )  20 MG tablet Take 20 mg by mouth daily.     tamsulosin (FLOMAX) 0.4 MG CAPS capsule Take 0.4 mg by mouth daily.     tiZANidine (ZANAFLEX) 4 MG tablet Take 4 mg by mouth every 8 (eight) hours as needed.     topiramate  (TOPAMAX ) 25 MG tablet Take 25 mg by mouth 2 (two) times daily.     traZODone  (DESYREL ) 100 MG tablet Take 200 mg by mouth at bedtime.     varenicline  (CHANTIX ) 1 MG tablet Take 1 mg by mouth 2 (two) times daily.     VITAMIN D  PO Take 1 capsule by mouth daily.     VOLTAREN  1 % GEL APPLY 2 TO 4 GRAMS EXTERNALLY TO THE AFFECTED AREA TWICE DAILY (Patient taking differently: Apply 4 g topically 4 (four) times daily as needed (pain).) 500 g 0   zolpidem (AMBIEN) 5 MG tablet Take 5 mg by mouth at bedtime as needed.     No current facility-administered  medications for this visit.    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   SVT (supraventricular tachycardia)    Wears dentures    Wears glasses     Past Surgical History:  Procedure Laterality Date   ABDOMINAL AORTOGRAM W/LOWER EXTREMITY N/A 05/30/2017   Procedure: ABDOMINAL AORTOGRAM W/LOWER EXTREMITY;  Surgeon: Harvey Carlin BRAVO, MD;  Location: [facility];  Service: Cardiovascular;  Laterality: N/A;   ABDOMINAL AORTOGRAM W/LOWER EXTREMITY N/A 07/12/2020   Procedure: ABDOMINAL AORTOGRAM W/LOWER EXTREMITY;  Surgeon: Gretta Lonni PARAS, MD;  Location: [facility];  Service: Cardiovascular;  Laterality: N/A;   ANKLE ARTHROSCOPY  01/17/2012   Procedure: ANKLE ARTHROSCOPY;  Surgeon: Jerona LULLA Sage, MD;  Location: [facility] OR;  Service: Orthopedics;  Laterality: Left;   ANKLE ARTHROSCOPY WITH FUSION Left 10/02/2016   Procedure: Left Posterior Subtalar Arthrodesis Arthroscopy;  Surgeon: Jerona LULLA Sage, MD;  Location: [facility] OR;  Service: Orthopedics;  Laterality: Left;   ANKLE FUSION Left 11/21/2012   Dr Sage MO FUSION Right 08/09/2020   Procedure: RIGHT ANKLE FUSION;  Surgeon: Sage Jerona LULLA, MD;  Location[facility] OR;  Service: Orthopedics;  Laterality: Right;   ANKLE SURGERY  09/23/2001   Left   BRAIN SURGERY  09/24/2007   Biopsy   CATARACT EXTRACTION W/PHACO Right 07/02/2024   Procedure: PHACOEMULSIFICATION, CATARACT, WITH IOL  INSERTION;  Surgeon: Juli Blunt, MD;  Location: [facility];  Service: Ophthalmology;  Laterality: Right;  CDE: 7.69   COLONOSCOPY WITH PROPOFOL  N/A 03/11/2019   Procedure: COLONOSCOPY WITH PROPOFOL ;  Surgeon: Shila Gustav GAILS, MD;  Location: [facility];  Service: Endoscopy;  Laterality: N/A;   FRACTURE SURGERY  09/23/2001   Left ankle   HARDWARE REMOVAL Left 12/08/2012   Procedure: HARDWARE REMOVAL;  Surgeon: Jerona GAILS Sage, MD;  Location: [facility] OR;  Service: Orthopedics;  Laterality: Left;  Removal Deep Hardware, Take Down Non-Union, Revision Internal Fixation Left Ankle   I & D EXTREMITY Left 01/01/2013   Procedure: IRRIGATION AND DEBRIDEMENT EXTREMITY;  Surgeon: Jerona GAILS Sage, MD;  Location: [facility] OR;  Service: Orthopedics;  Laterality: Left;  Irrigation, Debridement and Placement Antibiotic Beads Left Ankle   INNER EAR SURGERY     OPEN REDUCTION INTERNAL FIXATION (ORIF) SCAPHOID WITH DISTAL RADIUS GRAFT Right 02/01/2013   Procedure: OPEN REDUCTION INTERNAL FIXATION (ORIF) RIGHT SCAPHOID FRACTURE WITH DISTAL RADIUS GRAFT;  Surgeon: Donnice DELENA Robinsons, MD;  Location: [facility] OR;  Service: Orthopedics;  Laterality: Right;   ORIF ANKLE FRACTURE Left 12/08/2012   Procedure: OPEN REDUCTION INTERNAL FIXATION (ORIF) ANKLE FRACTURE;  Surgeon: Jerona GAILS Sage, MD;  Location: [facility] OR;  Service: Orthopedics;  Laterality: Left;  Removal Deep Hardware, Take Down Non-Union, Revision Internal Fixation Left Ankle   ORIF ANKLE FRACTURE Right 09/03/2013   Procedure: OPEN REDUCTION INTERNAL FIXATION (ORIF) ANKLE FRACTURE;  Surgeon: Jerona GAILS Sage, MD;  Location: [facility] OR;  Service: Orthopedics;   Laterality: Right;  Open Reduction Internal Fixation Right Fibula   POLYPECTOMY  03/11/2019   Procedure: POLYPECTOMY;  Surgeon: Shila Gustav GAILS, MD;  Location: [facility];  Service: Endoscopy;;   SCAPHOID FRACTURE SURGERY  02/04/2013   SHOULDER ARTHROSCOPY  09/23/2006   Left   TYMPANOSTOMY TUBE PLACEMENT  09/24/1991    Family History  Problem Relation Age of Onset   Hypertension Mother    Bronchitis Mother    Cancer Maternal Aunt    Cancer Maternal Grandmother    Healthy Sister    Healthy Brother    Stroke Neg Hx    Diabetes Neg Hx     Allergies as of 08/09/2024 - Review Complete 07/02/2024  Allergen Reaction Noted   Hydrocodone  Rash 07/29/2015   Lyrica [pregabalin] Swelling 05/08/2018   Other  05/06/2017    Social History   Socioeconomic History   Marital status: Married    Spouse name: Vickie   Number of children: 1   Years of education: 12   Highest education level: High school graduate  Occupational History    Employer: NOT EMPLOYED    Comment: Disabled  Tobacco Use   Smoking status: Every Day    Current packs/day: 0.50    Average packs/day: 0.5 packs/day for 40.0 years (20.0 ttl pk-yrs)    Types: Cigarettes   Smokeless tobacco: Never   Tobacco comments:    1 pack every 2 days    HEI. 09/03/2022  Vaping Use   Vaping status: Never Used  Substance and Sexual Activity   Alcohol use: No    Alcohol/week: 0.0 standard drinks of alcohol    Comment: quit Oct. 2017(heavy drinker) .In AA   Drug use: No   Sexual activity: Not Currently  Other Topics Concern   Not on file  Social History Narrative   Patient lives at home with his wife Rhunette)    Disabled   Left handed   Education 12 th  Caffeine Coffee four cups a day no tea no soda mostly water    Social Drivers of Corporate Investment Banker Strain: Not on file  Food Insecurity: Not on file  Transportation Needs: Not on file  Physical Activity: Not on file  Stress: Not on file  Social  Connections: Not on file    Review of Systems   Gen: Denies fever, chills, anorexia. Denies fatigue, weakness, weight loss.  CV: Denies chest pain, palpitations, syncope, peripheral edema, and claudication. Resp: Denies dyspnea at rest, cough, wheezing, coughing up blood, and pleurisy. GI: See HPI Derm: Denies rash, itching, dry skin Psych: Denies depression, anxiety, memory loss, confusion. No homicidal or suicidal ideation.  Heme: Denies bruising, bleeding, and enlarged lymph nodes.  Physical Exam   There were no vitals taken for this visit.  General:   Alert and oriented. No distress noted. Pleasant and cooperative.  Head:  Normocephalic and atraumatic. Eyes:  Conjuctiva clear without scleral icterus. Mouth:  Oral mucosa pink and moist. Good dentition. No lesions. Lungs:  Clear to auscultation bilaterally. No wheezes, rales, or rhonchi. No distress.  Heart:  S1, S2 present without murmurs appreciated.  Abdomen:  +BS, soft, non-tender and non-distended. No rebound or guarding. No HSM or masses noted. Rectal: *** Msk:  Symmetrical without gross deformities. Normal posture. Extremities:  Without edema. Neurologic:  Alert and  oriented x4 Psych:  Alert and cooperative. Normal mood and affect.  Assessment & Plan  Harold Bailey is a 58 y.o. male presenting today with ***   Follow up   ***Follow up ***    Charmaine Melia, MSN, FNP-BC, AGACNP-BC [facility]

## 2024-08-09 ENCOUNTER — Ambulatory Visit: Admitting: Gastroenterology

## 2024-08-10 ENCOUNTER — Encounter: Payer: Self-pay | Admitting: Vascular Surgery

## 2024-08-10 ENCOUNTER — Ambulatory Visit (INDEPENDENT_AMBULATORY_CARE_PROVIDER_SITE_OTHER): Admitting: Vascular Surgery

## 2024-08-10 VITALS — BP 135/88 | HR 87 | Ht 72.0 in | Wt 216.0 lb

## 2024-08-10 DIAGNOSIS — I739 Peripheral vascular disease, unspecified: Secondary | ICD-10-CM

## 2024-08-10 NOTE — Progress Notes (Signed)
 [facility] [place]  ASSESSMENT / PLAN: 58 y.o. male with left knee pain and loose feeling in his patella.  He does not have any typical symptoms of peripheral arterial disease, though he has significant blockage in both legs.  I counseled him that in the absence of limb threatening ischemia, I would not recommend any kind of vascular intervention at this time.  Will see him again in a year with repeat noninvasive testing.  CHIEF COMPLAINT: Left knee pain  HISTORY OF PRESENT ILLNESS: Harold Bailey is a 58 y.o. male referred to clinic for evaluation of peripheral arterial disease.  He is previously established with Dr. Gretta who performed a diagnostic angiogram for him about 4 years ago prior to planned ankle surgery.  At that time, as now he has fairly significant asymptomatic peripheral arterial disease in bilateral lower extremities.  He reports significant left knee pain and a loose feeling in the patella.  He does not endorse any claudication, rest pain, or ischemic ulceration.  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   SVT (supraventricular tachycardia)    Wears dentures    Wears glasses     Past Surgical History:  Procedure Laterality Date   ABDOMINAL AORTOGRAM W/LOWER EXTREMITY N/A 05/30/2017   Procedure: ABDOMINAL AORTOGRAM W/LOWER EXTREMITY;  Surgeon: Harvey Carlin BRAVO, MD;  Location: [facility];  Service: Cardiovascular;  Laterality: N/A;   ABDOMINAL AORTOGRAM W/LOWER EXTREMITY N/A 07/12/2020   Procedure: ABDOMINAL AORTOGRAM W/LOWER EXTREMITY;  Surgeon: Gretta Lonni PARAS, MD;  Location: [facility];  Service: Cardiovascular;  Laterality: N/A;   ANKLE ARTHROSCOPY  01/17/2012   Procedure: ANKLE ARTHROSCOPY;  Surgeon: Jerona LULLA Sage, MD;  Location: [facility];  Service: Orthopedics;  Laterality: Left;   ANKLE ARTHROSCOPY WITH FUSION Left 10/02/2016   Procedure: Left Posterior Subtalar Arthrodesis Arthroscopy;  Surgeon: Jerona LULLA Sage, MD;  Location: [facility];  Service: Orthopedics;  Laterality: Left;   ANKLE FUSION Left 11/21/2012   Dr Sage MO FUSION Right 08/09/2020   Procedure: RIGHT ANKLE FUSION;  Surgeon: Sage Jerona LULLA, MD;  Location: [facility];  Service: Orthopedics;  Laterality: Right;   ANKLE SURGERY  09/23/2001   Left   BRAIN SURGERY  09/24/2007   Biopsy   CATARACT EXTRACTION W/PHACO Right 07/02/2024   Procedure: PHACOEMULSIFICATION, CATARACT, WITH IOL INSERTION;  Surgeon: Juli Blunt, MD;  Location: [facility];  Service: Ophthalmology;  Laterality: Right;  CDE: 7.69   COLONOSCOPY WITH PROPOFOL  N/A 03/11/2019   Procedure: COLONOSCOPY WITH PROPOFOL ;  Surgeon: Shila Gustav GAILS, MD;  Location: [facility];  Service: Endoscopy;  Laterality: N/A;   FRACTURE SURGERY  09/23/2001   Left ankle   HARDWARE REMOVAL Left 12/08/2012   Procedure: HARDWARE REMOVAL;  Surgeon: Jerona GAILS Sage, MD;  Location: [facility];  Service: Orthopedics;  Laterality: Left;  Removal Deep Hardware, Take Down Non-Union, Revision Internal Fixation Left Ankle   I & D  EXTREMITY Left 01/01/2013   Procedure: IRRIGATION AND DEBRIDEMENT EXTREMITY;  Surgeon: Jerona GAILS Sage, MD;  Location: [facility];  Service: Orthopedics;  Laterality: Left;  Irrigation, Debridement and Placement Antibiotic Beads Left Ankle   INNER EAR SURGERY     OPEN REDUCTION INTERNAL FIXATION (ORIF) SCAPHOID WITH DISTAL RADIUS GRAFT Right 02/01/2013   Procedure: OPEN REDUCTION INTERNAL FIXATION (ORIF) RIGHT SCAPHOID FRACTURE WITH DISTAL RADIUS GRAFT;  Surgeon: Donnice DELENA Robinsons, MD;  Location: [facility];  Service: Orthopedics;  Laterality: Right;   ORIF ANKLE FRACTURE Left 12/08/2012   Procedure: OPEN REDUCTION INTERNAL FIXATION (ORIF) ANKLE FRACTURE;  Surgeon: Jerona GAILS Sage, MD;  Location: [facility];  Service: Orthopedics;  Laterality: Left;  Removal Deep Hardware, Take Down Non-Union, Revision Internal Fixation Left Ankle   ORIF ANKLE FRACTURE Right 09/03/2013   Procedure: OPEN REDUCTION INTERNAL FIXATION (ORIF) ANKLE FRACTURE;  Surgeon: Jerona GAILS Sage, MD;  Location: [facility];  Service: Orthopedics;  Laterality: Right;  Open Reduction Internal Fixation Right Fibula   POLYPECTOMY  03/11/2019   Procedure: POLYPECTOMY;  Surgeon: Shila Gustav GAILS, MD;  Location: [facility];  Service: Endoscopy;;   SCAPHOID FRACTURE SURGERY  02/04/2013   SHOULDER ARTHROSCOPY  09/23/2006   Left   TYMPANOSTOMY TUBE PLACEMENT  09/24/1991    Family History  Problem Relation Age of Onset   Hypertension Mother    Bronchitis Mother    Cancer Maternal Aunt    Cancer Maternal Grandmother    Healthy Sister    Healthy Brother    Stroke Neg Hx    Diabetes Neg Hx     Social History   Socioeconomic History   Marital status: Married    Spouse name: Vickie   Number of children: 1   Years of education: 12   Highest education level: High school graduate  Occupational History    Employer: NOT EMPLOYED    Comment: Disabled  Tobacco Use   Smoking status: Every Day    Current packs/day: 1.00    Average packs/day: 0.5  packs/day for 40.9 years (20.9 ttl pk-yrs)    Types: Cigarettes    Start date: 2025   Smokeless tobacco: Never   Tobacco comments:    1 pack every 2 days    HEI. 09/03/2022  Vaping Use   Vaping status: Never Used  Substance and Sexual Activity   Alcohol use: No    Alcohol/week: 0.0 standard drinks of alcohol    Comment: quit Oct. 2017(heavy drinker) .In AA   Drug use: No   Sexual activity: Not Currently  Other Topics Concern   Not on file  Social History Narrative   Patient lives at home with his wife Rhunette)    Disabled   Left handed   Education 12 th    Caffeine Coffee four cups a day no tea no soda mostly water    Social Drivers of Corporate Investment Banker Strain:  Not on file  Food Insecurity: Not on file  Transportation Needs: Not on file  Physical Activity: Not on file  Stress: Not on file  Social Connections: Not on file  Intimate Partner Violence: Not on file    Allergies  Allergen Reactions   Hydrocodone  Rash    Can take percocet   Lyrica [Pregabalin] Swelling    Swelling and itching in hands   Other     No narcotics due to history of opioid dependence    Current Outpatient Medications  Medication Sig Dispense Refill   albuterol  (PROVENTIL  HFA;VENTOLIN  HFA) 108 (90 Base) MCG/ACT inhaler Inhale 2 puffs into the lungs every 6 (six) hours as needed for wheezing or shortness of breath. 1 Inhaler 6   allopurinol  (ZYLOPRIM ) 300 MG tablet Take 300 mg by mouth daily.     aspirin  EC 81 MG tablet Take 81 mg by mouth daily.     EMGALITY 120 MG/ML SOSY      fluticasone  (FLONASE ) 50 MCG/ACT nasal spray Place 2 sprays into both nostrils daily.     Fluticasone -Umeclidin-Vilant (TRELEGY ELLIPTA ) 100-62.5-25 MCG/ACT AEPB Inhale 1 puff into the lungs daily. 60 each 1   gabapentin  (NEURONTIN ) 300 MG capsule Take 300 mg by mouth daily.     hydrOXYzine (ATARAX) 50 MG tablet Take 50 mg by mouth 2 (two) times daily as needed.     ibuprofen  (ADVIL ) 800 MG tablet TAKE 1 TABLET  BY MOUTH EVERY 8 HOURS AS NEEDED. MAX 3 PER DAY     levETIRAcetam  (KEPPRA ) 500 MG tablet TAKE 1 TABLET(500 MG) BY MOUTH TWICE DAILY 60 tablet 3   meclizine (ANTIVERT) 25 MG tablet Take 25 mg by mouth 3 (three) times daily as needed.     metFORMIN  (GLUCOPHAGE ) 500 MG tablet Take 500 mg by mouth 2 (two) times daily.     metoprolol  succinate (TOPROL -XL) 100 MG 24 hr tablet Take 1 tablet (100 mg total) by mouth every morning AND 0.5 tablets (50 mg total) every evening. Take with or immediately following a meal.. 135 tablet 3   mirabegron ER (MYRBETRIQ) 25 MG TB24 tablet Take 25 mg by mouth daily.     Multiple Vitamins-Minerals (MULTIVITAMIN WITH MINERALS) tablet Take 2 tablets by mouth daily.      naloxone (NARCAN) nasal spray 4 mg/0.1 mL      Naphazoline-Pheniramine (OPCON-A ) 0.027-0.315 % SOLN Place 1 drop into both eyes every 8 (eight) hours as needed (red, irritated eyes).     neomycin-polymyxin-hydrocortisone (CORTISPORIN) 3.5-10000-1 OTIC suspension Place 2 drops into the left ear 2 (two) times daily as needed (ear pain).     Oxycodone  HCl 10 MG TABS Take 10 mg by mouth 4 (four) times daily as needed.     oxyCODONE -acetaminophen  (PERCOCET/ROXICET) 5-325 MG tablet Take 1 tablet by mouth every 4 (four) hours as needed. 30 tablet 0   OXYGEN Inhale into the lungs. At home oxygen     pantoprazole  (PROTONIX ) 40 MG tablet Take 40 mg by mouth daily.     rosuvastatin  (CRESTOR ) 20 MG tablet Take 20 mg by mouth daily.     tamsulosin (FLOMAX) 0.4 MG CAPS capsule Take 0.4 mg by mouth daily.     tiZANidine (ZANAFLEX) 4 MG tablet Take 4 mg by mouth every 8 (eight) hours as needed.     topiramate  (TOPAMAX ) 25 MG tablet Take 25 mg by mouth 2 (two) times daily.     traZODone  (DESYREL ) 100 MG tablet Take 200 mg by mouth at bedtime.  varenicline  (CHANTIX ) 1 MG tablet Take 1 mg by mouth 2 (two) times daily.     VITAMIN D  PO Take 1 capsule by mouth daily.     VOLTAREN  1 % GEL APPLY 2 TO 4 GRAMS EXTERNALLY TO THE  AFFECTED AREA TWICE DAILY (Patient taking differently: Apply 4 g topically 4 (four) times daily as needed (pain).) 500 g 0   zolpidem (AMBIEN) 5 MG tablet Take 5 mg by mouth at bedtime as needed.     No current facility-administered medications for this visit.    PHYSICAL EXAM Vitals:   08/10/24 1355  BP: 135/88  Pulse: 87  Weight: 216 lb (98 kg)  Height: 6' (1.829 m)   Middle-age man, appears chronically ill Regular rate and rhythm Unlabored breathing No palpable pedal pulses  PERTINENT LABORATORY AND RADIOLOGIC DATA  Most recent CBC    Latest Ref Rng & Units 08/06/2024   11:32 AM 02/13/2024   11:33 AM 11/06/2023    1:51 PM  CBC  WBC 4.0 - 10.5 K/uL 7.6  8.4  8.9   Hemoglobin 13.0 - 17.0 g/dL 82.4  82.8  81.9   Hematocrit 39.0 - 52.0 % 53.0  54.1  57.4   Platelets 150 - 400 K/uL 152  197  180      Most recent CMP    Latest Ref Rng & Units 11/18/2023    3:21 PM 01/29/2021    3:22 PM 08/09/2020    8:10 AM  CMP  Glucose 70 - 99 mg/dL 897  97  897   BUN 6 - 20 mg/dL 21  12  14    Creatinine 0.61 - 1.24 mg/dL 9.27  9.15  9.29   Sodium 135 - 145 mmol/L 138  134  140   Potassium 3.5 - 5.1 mmol/L 4.1  5.0  4.6   Chloride 98 - 111 mmol/L 103  97  99   CO2 22 - 32 mmol/L 25  32    Calcium  8.9 - 10.3 mg/dL 9.1  9.7    Total Protein 6.5 - 8.1 g/dL 7.1  7.4    Total Bilirubin 0.0 - 1.2 mg/dL 0.4  0.3    Alkaline Phos 38 - 126 U/L 66  65    AST 15 - 41 U/L 20  20    ALT 0 - 44 U/L 26  18      Renal function CrCl cannot be calculated (Patient's most recent lab result is older than the maximum 21 days allowed.).  Hgb A1c MFr Bld (%)  Date Value  04/25/2020 6.0 (H)    LDL Cholesterol  Date Value Ref Range Status  04/25/2020 47 0 - 99 mg/dL Final    Comment:           Total Cholesterol/HDL:CHD Risk Coronary Heart Disease Risk Table                     Men   Women  1/2 Average Risk   3.4   3.3  Average Risk       5.0   4.4  2 X Average Risk   9.6   7.1  3 X Average  Risk  23.4   11.0        Use the calculated Patient Ratio above and the CHD Risk Table to determine the patient's CHD Risk.        ATP III CLASSIFICATION (LDL):  <100     mg/dL   Optimal  899-870  mg/dL   Near or Above                    Optimal  130-159  mg/dL   Borderline  839-810  mg/dL   High  >809     mg/dL   Very High Performed at [facility], [address] [place], [place] [address]      LOWER EXTREMITY DOPPLER STUDY   Patient Name:  ARIN PERAL  Date of Exam:   08/03/2024  Medical Rec #: [id number]           Accession #:    [id number]  Date of Birth: 1966-04-26           Patient Gender: M  Patient Age:   73 years  Exam Location:  [address]  Procedure:      VAS US  ABI WITH/WO TBI  Referring Phys: DEBBY ROBERTSON    ---------------------------------------------------------------------------  -----    Indications: Peripheral artery disease.       Comparison Study: LEFT LEA [facility] 06/03/24: 1. Eccentric  atheromatous                   plaque through the left common and superficial femoral                    arteries without high-grade stenosis.                    2. Proximal occlusion of the popliteal artery with  collateral                   reconstitution of tibial runoff.                    Angiogram 07/12/20:                    Left lower extremity runoff also shows a patent common                    femoral, profunda, and SFA with a distal SFA occlusion.  Flow                   was much more sluggish on the left leg and we did not                    completely see the runoff in the tibial vessels but did  see a                    PT fill on very delayed imaging.   Performing Technologist: King Pierre RVT     Examination Guidelines: A complete evaluation includes at minimum, Doppler  waveform signals and systolic blood pressure reading at the level of  bilateral  brachial, anterior tibial, and posterior tibial arteries, when vessel   segments  are accessible. Bilateral testing is considered an integral part of a  complete  examination. Photoelectric Plethysmograph (PPG) waveforms and toe systolic  pressure readings are included as required and additional duplex testing  as  needed. Limited examinations for reoccurring indications may be performed  as  noted.     ABI Findings:  +---------+------------------+-----+----------+--------+  Right   Rt Pressure (mmHg)IndexWaveform  Comment   +---------+------------------+-----+----------+--------+  Brachial 149                                        +---------+------------------+-----+----------+--------+  ATA     92                0.61 monophasic          +---------+------------------+-----+----------+--------+  PTA     118               0.79 triphasic           +---------+------------------+-----+----------+--------+  Great Toe68                0.45                     +---------+------------------+-----+----------+--------+   +---------+------------------+-----+----------+-------+  Left    Lt Pressure (mmHg)IndexWaveform  Comment  +---------+------------------+-----+----------+-------+  Brachial 150                                       +---------+------------------+-----+----------+-------+  ATA     42                0.28 monophasic         +---------+------------------+-----+----------+-------+  PTA     101               0.67 triphasic          +---------+------------------+-----+----------+-------+  Great Toe42                0.28                    +---------+------------------+-----+----------+-------+   +-------+-----------+-----------+------------+------------+  ABI/TBIToday's ABIToday's TBIPrevious ABIPrevious TBI  +-------+-----------+-----------+------------+------------+  Right 0.79       0.45       0.91        0.62           +-------+-----------+-----------+------------+------------+  Left  0.67       0.28       0.85        0.61          +-------+-----------+-----------+------------+------------+        Previous ABI on 07/06/20.    Summary:  Right: Resting right ankle-brachial index indicates moderate right lower  extremity arterial disease. The right toe-brachial index is abnormal.    Left: Resting left ankle-brachial index indicates moderate left lower  extremity arterial disease. The left toe-brachial index is abnormal.     *See table(s) above for measurements and observations.       Electronically signed by Debby Robertson on 08/04/2024 at 2:09:05 PM.   Debby SAILOR. Robertson, MD [facility] Phone Number: [phone] 08/10/2024 3:52 PM   Total time spent on preparing this encounter including chart review, data review, collecting history, examining the patient, and coordinating care: 45 min  Portions of this report may have been transcribed using voice recognition software.  Every effort has been made to ensure accuracy; however, inadvertent computerized transcription errors may still be present.

## 2024-08-16 NOTE — H&P (Signed)
 Surgical History & Physical  Patient Name: Harold Bailey  DOB: Oct 03, 1965  Surgery: Cataract extraction with intraocular lens implant phacoemulsification; Left Eye Surgeon: Marsa Cleverly MD Surgery Date: 08/24/2024 Pre-Op Date: 07/13/2024  HPI: A 43 Yr. old male patient 1. The patient is returning after cataract surgery. Status post cataract surgery, which began 11 days ago: Since the last visit, the affected area feels improvement. The patient's vision is stable. The condition's severity is constant. Patient is following medication instructions. HPI was performed by Marsa Cleverly .  Medical History: Cataracts  Arthritis Diabetes Lung Problems Stroke low cholesterol, enlarged prostrate, GERD  Review of Systems Musculoskeletal arthrithis Neurological Stroke Respiratory COPD All recorded systems are negative except as noted above.  Social Current every day smoker of Cigarettes   Medication Prednisolone-moxiflox-bromfen,  Metformin , Tizanidine, Levetiracetam , Zolpidem, Albuterol  sulfate, Fluticasone  propionate, Albuterol  sulfate, Ibuprofen , Metoprolol  succinate, Tamsulosin, Buprenorphine HCl, Ventolin  HFA, Pantoprazole , Trazodone , Varenicline  tartrate, Myrbetriq, Trelegy Ellipta   Sx/Procedures Phaco c IOL OD  Drug Allergies  NKDA  History & Physical: Heent: cataracts NECK: supple without bruits LUNGS: lungs clear to auscultation CV: regular rate and rhythm Abdomen: soft and non-tender  Impression & Plan: Assessment: 1.  KERATOCONJUNCTIVITIS SICCA NOT SPECIFIED AS SJORGRENS; ,, Both Eyes (Y83.776) 2.  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  CATARACT EXTRACTION STATUS; Right Eye (Z98.41) 4.  INTRAOCULAR LENS IOL (Z96.1) 5.  CATARACT NUCLEAR SCLEROSIS AGE RELATED; Left Eye (H25.12)  Plan: 1.  Dry eye. Mild signs of dry eye at this time. Can use artificial tears QID OU PRN and warm compresses once daily as needed.  2.  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  3.  CE/PCIOL OD 07/02/24  POD11. Doing well. All post-op precautions discussed and instructions reviewed. Written instructions given.  4.   5.  Cataracts are visually significant and account for the patient's complaints. Discussed all risks, benefits, procedures and recovery, including infection, loss of vision and eye, need for glasses after surgery or additional procedures. Patient understands changing glasses will not improve vision. Patient indicated understanding of procedure. All questions answered. Patient desires to have surgery, recommend phacoemulsification with intraocular lens. Patient to have preliminary testing necessary ([employer]/IOL Master, Mac OCT, TOPO) Educational materials provided:Cataract.  Plan: - Proceed with cataract surgery OS when ready - Plan for best distance target with DIB00 - left sided homonymous will limit vision - No DM, no fuchs, no prior eye surgery - good dilation, on tamsulosin, careful with iris - Dextenza if available

## 2024-08-17 ENCOUNTER — Other Ambulatory Visit: Payer: Self-pay

## 2024-08-17 ENCOUNTER — Encounter (HOSPITAL_COMMUNITY)
Admission: RE | Admit: 2024-08-17 | Discharge: 2024-08-17 | Disposition: A | Discharge status: Home / Self Care | Source: Ambulatory Visit | Attending: Optometry | Admitting: Optometry

## 2024-08-17 ENCOUNTER — Encounter (HOSPITAL_COMMUNITY): Payer: Self-pay

## 2024-08-24 ENCOUNTER — Encounter (HOSPITAL_COMMUNITY): Admission: RE | Payer: Self-pay | Source: Home / Self Care

## 2024-08-24 ENCOUNTER — Ambulatory Visit (HOSPITAL_COMMUNITY): Admission: RE | Admit: 2024-08-24 | Source: Home / Self Care | Admitting: Optometry

## 2024-08-24 SURGERY — PHACOEMULSIFICATION, CATARACT, WITH IOL INSERTION
Anesthesia: Monitor Anesthesia Care | Laterality: Left

## 2024-09-20 ENCOUNTER — Encounter: Payer: Self-pay | Admitting: *Deleted

## 2024-09-27 ENCOUNTER — Ambulatory Visit: Admitting: Gastroenterology

## 2024-10-07 ENCOUNTER — Telehealth: Payer: Self-pay

## 2024-10-07 NOTE — Telephone Encounter (Signed)
 Spoke with patient. Reviewed Lung CT results for the last 3 years. Patient request to skip annual Lung CT in 2026 and resume in 2027 due to previous negative results. Reminder has been set for 2027.   AP

## 2024-10-14 NOTE — Telephone Encounter (Signed)
 Spoke with patient and he has decided to schedule lung screening CT for this year. Scheduled at [facility] 12/15/24 10:30.

## 2024-11-02 ENCOUNTER — Ambulatory Visit: Admitting: Gastroenterology

## 2024-11-08 ENCOUNTER — Ambulatory Visit: Payer: Self-pay

## 2024-11-09 ENCOUNTER — Inpatient Hospital Stay: Payer: 59

## 2024-11-09 ENCOUNTER — Inpatient Hospital Stay: Payer: 59 | Attending: Hematology | Admitting: Physician Assistant

## 2024-12-15 ENCOUNTER — Ambulatory Visit (HOSPITAL_COMMUNITY)
